# Patient Record
Sex: Male | Born: 1965 | Race: White | Hispanic: No | Marital: Married | Smoking: Never smoker
Health system: Southern US, Community
[De-identification: ages and names within clinical notes are randomized; demographics above are authoritative.]

## PROBLEM LIST (undated history)

## (undated) DIAGNOSIS — Z5111 Encounter for antineoplastic chemotherapy: Secondary | ICD-10-CM

## (undated) DIAGNOSIS — Z9889 Other specified postprocedural states: Secondary | ICD-10-CM

## (undated) DIAGNOSIS — R112 Nausea with vomiting, unspecified: Secondary | ICD-10-CM

## (undated) DIAGNOSIS — I10 Essential (primary) hypertension: Secondary | ICD-10-CM

## (undated) DIAGNOSIS — G43909 Migraine, unspecified, not intractable, without status migrainosus: Secondary | ICD-10-CM

## (undated) DIAGNOSIS — M75102 Unspecified rotator cuff tear or rupture of left shoulder, not specified as traumatic: Secondary | ICD-10-CM

## (undated) DIAGNOSIS — K219 Gastro-esophageal reflux disease without esophagitis: Secondary | ICD-10-CM

## (undated) DIAGNOSIS — J189 Pneumonia, unspecified organism: Secondary | ICD-10-CM

## (undated) DIAGNOSIS — Z923 Personal history of irradiation: Secondary | ICD-10-CM

## (undated) DIAGNOSIS — C3492 Malignant neoplasm of unspecified part of left bronchus or lung: Secondary | ICD-10-CM

## (undated) DIAGNOSIS — Z7189 Other specified counseling: Secondary | ICD-10-CM

## (undated) HISTORY — PX: TONSILLECTOMY: SUR1361

## (undated) HISTORY — DX: Malignant neoplasm of unspecified part of left bronchus or lung: C34.92

## (undated) HISTORY — PX: TOTAL KNEE ARTHROPLASTY: SHX125

## (undated) HISTORY — PX: INGUINAL HERNIA REPAIR: SUR1180

## (undated) HISTORY — DX: Personal history of irradiation: Z92.3

## (undated) HISTORY — DX: Migraine, unspecified, not intractable, without status migrainosus: G43.909

## (undated) HISTORY — DX: Essential (primary) hypertension: I10

## (undated) HISTORY — DX: Gastro-esophageal reflux disease without esophagitis: K21.9

## (undated) HISTORY — DX: Encounter for antineoplastic chemotherapy: Z51.11

## (undated) HISTORY — DX: Other specified counseling: Z71.89

---

## 1999-10-21 ENCOUNTER — Emergency Department (HOSPITAL_COMMUNITY): Admission: EM | Admit: 1999-10-21 | Discharge: 1999-10-21 | Payer: Self-pay | Admitting: Emergency Medicine

## 1999-10-21 ENCOUNTER — Encounter: Payer: Self-pay | Admitting: Emergency Medicine

## 2001-04-20 ENCOUNTER — Encounter: Admission: RE | Admit: 2001-04-20 | Discharge: 2001-04-20 | Payer: Self-pay | Admitting: *Deleted

## 2006-06-29 ENCOUNTER — Emergency Department (HOSPITAL_COMMUNITY): Admission: EM | Admit: 2006-06-29 | Discharge: 2006-06-29 | Payer: Self-pay | Admitting: Emergency Medicine

## 2008-01-04 ENCOUNTER — Inpatient Hospital Stay (HOSPITAL_COMMUNITY): Admission: RE | Admit: 2008-01-04 | Discharge: 2008-01-06 | Payer: Self-pay | Admitting: Orthopedic Surgery

## 2009-11-08 ENCOUNTER — Ambulatory Visit: Payer: Self-pay | Admitting: Internal Medicine

## 2009-11-08 DIAGNOSIS — I10 Essential (primary) hypertension: Secondary | ICD-10-CM | POA: Insufficient documentation

## 2009-11-08 DIAGNOSIS — K219 Gastro-esophageal reflux disease without esophagitis: Secondary | ICD-10-CM

## 2009-11-08 DIAGNOSIS — R232 Flushing: Secondary | ICD-10-CM | POA: Insufficient documentation

## 2009-11-08 DIAGNOSIS — B351 Tinea unguium: Secondary | ICD-10-CM | POA: Insufficient documentation

## 2009-11-08 HISTORY — DX: Essential (primary) hypertension: I10

## 2009-11-08 HISTORY — DX: Gastro-esophageal reflux disease without esophagitis: K21.9

## 2009-11-11 ENCOUNTER — Encounter: Payer: Self-pay | Admitting: Internal Medicine

## 2009-11-11 LAB — CONVERTED CEMR LAB
Metaneph Total, Ur: 415 ug/24hr (ref 182–739)
Metanephrines, Ur: 123 (ref 58–203)
Normetanephrine, 24H Ur: 292 (ref 88–649)

## 2009-11-20 ENCOUNTER — Ambulatory Visit: Payer: Self-pay | Admitting: Internal Medicine

## 2009-11-20 ENCOUNTER — Encounter: Payer: Self-pay | Admitting: Internal Medicine

## 2009-11-20 DIAGNOSIS — J209 Acute bronchitis, unspecified: Secondary | ICD-10-CM | POA: Insufficient documentation

## 2009-11-26 ENCOUNTER — Telehealth: Payer: Self-pay | Admitting: Internal Medicine

## 2009-12-13 ENCOUNTER — Telehealth: Payer: Self-pay | Admitting: Internal Medicine

## 2009-12-19 ENCOUNTER — Telehealth: Payer: Self-pay | Admitting: Internal Medicine

## 2010-01-07 ENCOUNTER — Telehealth: Payer: Self-pay | Admitting: Internal Medicine

## 2010-04-24 ENCOUNTER — Ambulatory Visit: Payer: Self-pay | Admitting: Internal Medicine

## 2010-05-01 ENCOUNTER — Ambulatory Visit: Payer: Self-pay | Admitting: Internal Medicine

## 2010-05-19 ENCOUNTER — Telehealth: Payer: Self-pay | Admitting: Internal Medicine

## 2010-05-20 ENCOUNTER — Ambulatory Visit: Payer: Self-pay | Admitting: Internal Medicine

## 2010-08-04 ENCOUNTER — Telehealth: Payer: Self-pay | Admitting: Internal Medicine

## 2010-10-14 ENCOUNTER — Ambulatory Visit
Admission: RE | Admit: 2010-10-14 | Discharge: 2010-10-14 | Payer: Self-pay | Source: Home / Self Care | Attending: Internal Medicine | Admitting: Internal Medicine

## 2010-10-20 ENCOUNTER — Telehealth: Payer: Self-pay | Admitting: Internal Medicine

## 2010-11-09 LAB — CONVERTED CEMR LAB
ALT: 34 units/L (ref 0–53)
AST: 23 units/L (ref 0–37)
Albumin: 4.3 g/dL (ref 3.5–5.2)
Alkaline Phosphatase: 67 units/L (ref 39–117)
BUN: 19 mg/dL (ref 6–23)
Basophils Absolute: 0 10*3/uL (ref 0.0–0.1)
Basophils Relative: 0 % (ref 0.0–3.0)
Bilirubin, Direct: 0.1 mg/dL (ref 0.0–0.3)
CO2: 33 meq/L — ABNORMAL HIGH (ref 19–32)
Calcium: 9.6 mg/dL (ref 8.4–10.5)
Chloride: 100 meq/L (ref 96–112)
Creatinine, Ser: 1.4 mg/dL (ref 0.4–1.5)
Eosinophils Absolute: 0.2 10*3/uL (ref 0.0–0.7)
Eosinophils Relative: 3.7 % (ref 0.0–5.0)
GFR calc non Af Amer: 58.8 mL/min (ref >60)
Glucose, Bld: 120 mg/dL — ABNORMAL HIGH (ref 70–99)
HCT: 42.2 % (ref 39.0–52.0)
Hemoglobin: 14.1 g/dL (ref 13.0–17.0)
Lymphocytes Relative: 23.5 % (ref 12.0–46.0)
Lymphs Abs: 1.2 10*3/uL (ref 0.7–4.0)
MCHC: 33.4 g/dL (ref 30.0–36.0)
MCV: 97.3 fL (ref 78.0–100.0)
Monocytes Absolute: 0.4 10*3/uL (ref 0.1–1.0)
Monocytes Relative: 8.3 % (ref 3.0–12.0)
Neutro Abs: 3.5 10*3/uL (ref 1.4–7.7)
Neutrophils Relative %: 64.5 % (ref 43.0–77.0)
Platelets: 219 10*3/uL (ref 150.0–400.0)
Potassium: 4.5 meq/L (ref 3.5–5.1)
RBC: 4.33 M/uL (ref 4.22–5.81)
RDW: 12 % (ref 11.5–14.6)
Sodium: 139 meq/L (ref 135–145)
TSH: 1.5 microintl units/mL (ref 0.35–5.50)
Total Bilirubin: 0.6 mg/dL (ref 0.3–1.2)
Total Protein: 7.2 g/dL (ref 6.0–8.3)
WBC: 5.3 10*3/uL (ref 4.5–10.5)

## 2010-11-10 ENCOUNTER — Telehealth: Payer: Self-pay | Admitting: Internal Medicine

## 2010-11-11 NOTE — Assessment & Plan Note (Signed)
Summary: jones pt/earache?/SD   Vital Signs:  Patient profile:   45 year old male Height:      72 inches Weight:      221 pounds BMI:     30.08 O2 Sat:      96 % on Room air Temp:     98.1 degrees F oral Pulse rate:   68 / minute BP sitting:   122 / 58  (left arm) Cuff size:   large  Vitals Entered By: Bill Salinas CMA (May 20, 2010 3:52 PM)  O2 Flow:  Room air CC: pt here with c/o both ears aching x 3 days/ ab   Primary Care Provider:  Etta Grandchild MD  CC:  pt here with c/o both ears aching x 3 days/ ab.  History of Present Illness: Patient presents c/o hearing change, worse in the left ear. He describes a pressure type pain. He has had no fever, no drainage from the ear, no tinnitis.  Current Medications (verified): 1)  Micardis 80 Mg Tabs (Telmisartan) .... One By Mouth Once Daily  Allergies (verified): No Known Drug Allergies  Past History:  Past Medical History: Last updated: 11/08/2009 Hypertension GERD  Past Surgical History: Last updated: 11/08/2009 Inguinal herniorrhaphy Total knee replacement PSH reviewed for relevance, FH reviewed for relevance  Review of Systems  The patient denies fever, decreased hearing, hoarseness, syncope, dyspnea on exertion, peripheral edema, and hemoptysis.    Physical Exam  General:  heavy-set white male in no distress Head:  Normocephalic and atraumatic without obvious abnormalities. No apparent alopecia or balding. Ears:  EACs clear. right TM OK, left TM without erythema, poor lankmarks, poor movement with insuffalation.   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 Hearing mildly impaired.  Plan - otc decongestant, sudafed - 30mg  three times a day, as needed.   Complete Medication List: 1)  Micardis 80 Mg Tabs (Telmisartan) .... One by mouth once daily

## 2010-11-11 NOTE — Progress Notes (Signed)
Summary: Samples  Phone Note Call from Patient Call back at [phone]   Summary of Call: Patient is requesting samples of BP med due to [facility] delay.  Initial call taken by: Lamar Sprinkles, CMA,  January 07, 2010 11:10 AM  Follow-up for Phone Call        spouse notified. Follow-up by: Lucious Groves,  January 08, 2010 8:32 AM

## 2010-11-11 NOTE — Progress Notes (Signed)
Summary: refill request  Phone Note Refill Request Message from:  Fax from Pharmacy on November 26, 2009 9:12 AM  Refills Requested: Medication #1:  Sandria Senter ER 8-10 MG/5ML LQCR 5 ml by mouth two times a day as needed for cough. Pt last given rx at 11/20/09 appt. Is this ok to refill?  [facility] [address]  Initial call taken by: Rock Nephew CMA,  November 26, 2009 9:13 AM    Prescriptions: Sandria Senter ER 8-10 MG/5ML LQCR (CHLORPHENIRAMINE-HYDROCODONE) 5 ml by mouth two times a day as needed for cough  #4 ounces x 0   Entered and Authorized by:   Etta Grandchild MD   Signed by:   Etta Grandchild MD on 11/26/2009   Method used:   Printed then faxed to ...       [id number] (ret[facility]       [address]       [place], [place]  [address]       Ph: [phone] or [phone]       Fax: [phone]   RxID:   [id number]

## 2010-11-11 NOTE — Assessment & Plan Note (Signed)
Summary: STITCH REMOVAL / MAY COME AT 4:00 /NWS   Vital Signs:  Patient profile:   45 year old male Height:      72 inches Weight:      221 pounds BMI:     30.08 O2 Sat:      97 % on Room air Temp:     98.3 degrees F oral Pulse rate:   65 / minute BP sitting:   120 / 78  (left arm) Cuff size:   large  Vitals Entered By: Bill Salinas CMA (May 01, 2010 3:36 PM)  O2 Flow:  Room air CC: suture removal/ab    Primary Care Provider:  Etta Grandchild MD  CC:  suture removal/ab .  History of Present Illness: Patient for suture removal from right thumb. He has done well with no problems: no pain, no drainage.   Current Medications (verified): 1)  Micardis 80 Mg Tabs (Telmisartan) .... One By Mouth Once Daily  Allergies (verified): No Known Drug Allergies PMH-FH-SH reviewed-no changes except otherwise noted  Review of Systems  The patient denies fever, suspicious skin lesions, abnormal bleeding, and enlarged lymph nodes.    Physical Exam  General:  Well-developed,well-nourished,in no acute distress; alert,appropriate and cooperative throughout examination Lungs:  normal respiratory effort.  normal respiratory effort.   Heart:  normal rate and regular rhythm.  normal rate and regular rhythm.   Skin:  laceration right thumb has healed well. Suture line without dehisceince. After suture removal wound looked good with good closure.    Impression & Recommendations:  Problem # 1:  LACERATION, RIGHT THUMB (ICD-883.0) Patient for suture removal. Good wound healing. CMA Bullins removed sutures without difficulty.   Complete Medication List: 1)  Micardis 80 Mg Tabs (Telmisartan) .... One by mouth once daily

## 2010-11-11 NOTE — Letter (Signed)
Summary: Results Follow-up Letter  [facility]  [address] [place], [place] [address]   Phone: [phone]  Fax: [phone]    11/11/2009  [address] [place], [place]  [address]  Dear Mr. Diantonio,   The following are the results of your recent test(s):  Test     Result     Kidney     normal Blood sugar     slightly elevated at 120 CBC       normal Liver       normal Thyroid     normal   _________________________________________________________  Please call for an appointment in 1-2 months _________________________________________________________ _________________________________________________________ _________________________________________________________  Sincerely,  Sanda Linger MD [facility]

## 2010-11-11 NOTE — Letter (Signed)
Summary: Out of Work  [facility]  [address] [place], [place] [address]   Phone: [phone]  Fax: [phone]    November 20, 2009   Employee:  FERRELL CLAIBORNE    To Whom It May Concern:   For Medical reasons, please excuse the above named employee from work for the following dates:  Start:   11/17/09  End:   11/24/09  If you need additional information, please feel free to contact our office.         Sincerely,    Etta Grandchild MD

## 2010-11-11 NOTE — Progress Notes (Signed)
  Phone Note Call from Patient Call back at [phone]   Caller: SpouseDondra Spry [phone] Summary of Call: Patient wife called satating that pt c/o Earache since coming back from recent beach trip. She would like to know what MD suggest, drops etc. Please advise Initial call taken by: Rock Nephew CMA,  May 19, 2010 2:50 PM  Follow-up for Phone Call        Spoke w/pt, scheduled for office visit for eval Follow-up by: Lamar Sprinkles, CMA,  May 19, 2010 3:13 PM

## 2010-11-11 NOTE — Assessment & Plan Note (Signed)
Summary: NEW [employer] PT--PT/GIVEN ---STC   Vital Signs:  Patient profile:   45 year old male Height:      72 inches Weight:      224 pounds BMI:     30.49 O2 Sat:      95 % on Room air Temp:     98.7 degrees F oral Pulse rate:   74 / minute Pulse rhythm:   regular Resp:     16 per minute BP sitting:   122 / 80  (left arm) Cuff size:   large  Vitals Entered By: Rock Nephew CMA (November 08, 2009 4:22 PM)  Nutrition Counseling: Patient's BMI is greater than 25 and therefore counseled on weight management options.  O2 Flow:  Room air  Primary Care Provider:  Etta Grandchild MD   History of Present Illness: New to me with complaints  1. dizziness and facial flushing for "a while" 2. all toenails on right foot have thickening, pain 3. Htn. f/up  Hypertension History:      He denies headache, chest pain, palpitations, dyspnea with exertion, orthopnea, PND, peripheral edema, and syncope.  He notes the following problems with antihypertensive medication side effects: flushing and dizzness.        Positive major cardiovascular risk factors include hypertension and family history for ischemic heart disease (females less than 7 years old & males less than 18 years old).  Negative major cardiovascular risk factors include male age less than 83 years old, no history of diabetes or hyperlipidemia, and non-tobacco-user status.        Further assessment for target organ damage reveals no history of ASHD, cardiac end-organ damage (CHF/LVH), stroke/TIA, peripheral vascular disease, renal insufficiency, or hypertensive retinopathy.     Preventive Screening-Counseling & Management  Alcohol-Tobacco     Alcohol drinks/day: 2     Alcohol type: beer     >5/day in last 3 mos: yes     Alcohol Counseling: to STOP drinking     Feels need to cut down: no     Feels annoyed by complaints: no     Feels guilty re: drinking: yes     Needs 'eye opener' in am: yes     Smoking Status:  never  Caffeine-Diet-Exercise     Does Patient Exercise: yes  Hep-HIV-STD-Contraception     Hepatitis Risk: no risk noted     HIV Risk: no risk noted     STD Risk: no risk noted     TSE monthly: yes  Safety-Violence-Falls     Seat Belt Use: yes     Helmet Use: yes     Firearms in the Home: firearms in the home     Firearm Counseling: to practice firearm safety     Smoke Detectors: yes     Violence in the Home: no risk noted     Sexual Abuse: no      Sexual History:  currently monogamous.        Drug Use:  never and no.        Blood Transfusions:  no.    Current Medications (verified): 1)  Micardis Hct 80-25 Mg Tabs (Telmisartan-Hctz) .... Take 1 Tablet By Mouth Once A Day  Allergies (verified): No Known Drug Allergies  Past History:  Past Medical History: Hypertension GERD  Past Surgical History: Inguinal herniorrhaphy Total knee replacement  Family History: Family History of CAD Male 1st degree relative <60 Family History of CAD Male 1st degree relative <50 Family History  Lung cancer  Social History: Occupation: Designer, industrial/product Never Smoked Alcohol use-yes Drug use-no Regular exercise-yes Married Smoking Status:  never Drug Use:  never, no Does Patient Exercise:  yes Hepatitis Risk:  no risk noted HIV Risk:  no risk noted STD Risk:  no risk noted Seat Belt Use:  yes Sexual History:  currently monogamous Blood Transfusions:  no  Review of Systems       The patient complains of weight gain.  The patient denies anorexia, fever, weight loss, chest pain, syncope, peripheral edema, prolonged cough, headaches, hemoptysis, abdominal pain, melena, hematochezia, severe indigestion/heartburn, hematuria, suspicious skin lesions, difficulty walking, depression, enlarged lymph nodes, and angioedema.    Physical Exam  General:  alert, well-developed, well-nourished, well-hydrated, and overweight-appearing.  face is ruddy and seems to flush more with anxiety  or attention. Head:  normocephalic, atraumatic, no abnormalities observed, and no abnormalities palpated.   Eyes:  vision grossly intact, pupils equal, pupils round, and pupils reactive to light.   Mouth:  Oral mucosa and oropharynx without lesions or exudates.  Teeth in good repair. Neck:  supple, full ROM, no masses, no thyromegaly, no thyroid nodules or tenderness, no JVD, no carotid bruits, and no cervical lymphadenopathy.   Lungs:  normal respiratory effort, no intercostal retractions, no accessory muscle use, normal breath sounds, no dullness, no fremitus, no crackles, and no wheezes.   Heart:  normal rate, regular rhythm, no murmur, no gallop, no rub, and no JVD.   Abdomen:  soft, non-tender, normal bowel sounds, no distention, no masses, no guarding, no rigidity, no hepatomegaly, and no splenomegaly.   Msk:  normal ROM, no joint tenderness, no joint swelling, no joint warmth, no redness over joints, no joint deformities, no joint instability, and no crepitation.   Pulses:  R and L carotid,radial,femoral,dorsalis pedis and posterior tibial pulses are full and equal bilaterally Extremities:  No clubbing, cyanosis, edema, or deformity noted with normal full range of motion of all joints.   Skin:  all toenails on the right side have thickening, debris, lysis, and discoloration but there is no erythema or exudate. the left side is not involved. Cervical Nodes:  no anterior cervical adenopathy and no posterior cervical adenopathy.   Axillary Nodes:  no R axillary adenopathy and no L axillary adenopathy.   Inguinal Nodes:  no R inguinal adenopathy and no L inguinal adenopathy.   Psych:  normally interactive, good eye contact, not depressed appearing, not agitated, not suicidal, and slightly anxious.   Additional Exam:  EKG shows NSR with no Q waves or ST/T wave changes.   Impression & Recommendations:  Problem # 1:  FACIAL FLUSHING (ICD-782.62) Assessment New will r/o pheo, ask him to abstain  from alchohol, stop HCTZ, and check labs Orders: Venipuncture ([address]) TLB-BMP (Basic Metabolic Panel-BMET) (80048-METABOL) TLB-CBC Platelet - w/Differential (85025-CBCD) TLB-Hepatic/Liver Function Pnl (80076-HEPATIC) TLB-TSH (Thyroid Stimulating Hormone) (84443-TSH) T-Urine 24 Hr. Metanephrines [phone])  Problem # 2:  HYPERTENSION (ICD-401.9) Assessment: Improved  The following medications were removed from the medication list:    Micardis Hct 80-25 Mg Tabs (Telmisartan-hctz) .Marland Kitchen... Take 1 tablet by mouth once a day  Orders: Venipuncture ([address]) TLB-BMP (Basic Metabolic Panel-BMET) (80048-METABOL) TLB-CBC Platelet - w/Differential (85025-CBCD) TLB-Hepatic/Liver Function Pnl (80076-HEPATIC) TLB-TSH (Thyroid Stimulating Hormone) (84443-TSH) T-Urine 24 Hr. 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 One by mouth once daily  Orders: Venipuncture ([address]) TLB-BMP (Basic Metabolic Panel-BMET) (80048-METABOL) TLB-CBC Platelet - w/Differential (85025-CBCD) TLB-Hepatic/Liver Function Pnl (80076-HEPATIC) TLB-TSH (Thyroid Stimulating Hormone) (84443-TSH) T-Urine 24 Hr. Metanephrines [phone])  Complete Medication List: 1)  Terbinafine Hcl 250 Mg Tabs (Terbinafine hcl) .... One by mouth once daily  Hypertension Assessment/Plan:      The patient's hypertensive risk group is category B: At least one risk factor (excluding diabetes) with no target organ damage.  Today's blood pressure is 122/80.  His blood pressure goal is < 140/90.  Patient Instructions: 1)  Please schedule a follow-up appointment in 1 month. 2)  It is important that you exercise regularly at least 20 minutes 5 times a week. If you develop chest pain,  have severe difficulty breathing, or feel very tired , stop exercising immediately and seek medical attention. 3)  You need to lose weight. Consider a lower calorie diet and regular exercise.  4)  Check your Blood Pressure regularly. If it is above 140/90: you should make an appointment. 5)  Stop drinking alcohol. Prescriptions: TERBINAFINE HCL 250 MG TABS (TERBINAFINE HCL) One by mouth once daily  #30 x 4   Entered and Authorized by:   Etta Grandchild MD   Signed by:   Etta Grandchild MD on 11/08/2009   Method used:   Print then Give to Patient   RxID:   [id number]   Preventive Care Screening  Last Tetanus Booster:    Date:  10/12/2008    Results:  Historical     Immunization History:  Influenza Immunization History:    Influenza:  historical (07/12/2009)

## 2010-11-11 NOTE — Progress Notes (Signed)
Summary: SAMPLES  Phone Note Call from Patient   Caller: Wife - [phone] Summary of Call: Patient is requesting samples of micardis while waiting on mail order.  Initial call taken by: Lamar Sprinkles, CMA,  August 04, 2010 11:35 AM  Follow-up for Phone Call        Samples ready, left vm on hm # Follow-up by: Lamar Sprinkles, CMA,  August 04, 2010 6:19 PM    Prescriptions: MICARDIS 80 MG TABS (TELMISARTAN) One by mouth once daily  #21 x 0   Entered by:   Lamar Sprinkles, CMA   Authorized by:   Etta Grandchild MD   Signed by:   Lamar Sprinkles, CMA on 08/04/2010   Method used:   Samples Given   RxID:   [id number]

## 2010-11-11 NOTE — Progress Notes (Signed)
Summary: Micardis samples  Phone Note Call from Patient   Caller: Spouse--Gail--[phone] Summary of Call: Spouse called requesting samples of Micardis, stating that she did not allow enough time for [facility] order and the patient is completely out of this med. [facility]  Initial call taken by: Lucious Groves,  December 19, 2009 8:34 AM  Follow-up for Phone Call        Spouse notified that prescription was sent to [facility] on 3/4 and samples placed up front for pickup. Follow-up by: Lucious Groves,  December 19, 2009 8:37 AM

## 2010-11-11 NOTE — Assessment & Plan Note (Signed)
Summary: injured thumb/jones/cd   Vital Signs:  Patient profile:   45 year old male Height:      72 inches Weight:      221 pounds BMI:     30.08 O2 Sat:      98 % on Room air Temp:     98.3 degrees F oral Pulse rate:   67 / minute BP sitting:   138 / 82  (left arm) Cuff size:   large  Vitals Entered By: Bill Salinas CMA (April 24, 2010 2:05 PM)  O2 Flow:  Room air CC: pt here for evaluation of cut (from a can) on his right thumb/ ab   Primary Care Provider:  Etta Grandchild MD  CC:  pt here for evaluation of cut (from a can) on his right thumb/ ab.  History of Present Illness: patient presents acutely for laceration of the right thumb.  Current Medications (verified): 1)  Micardis 80 Mg Tabs (Telmisartan) .... One By Mouth Once Daily  Allergies (verified): No Known Drug Allergies  Past History:  Past Medical History: Last updated: 11/08/2009 Hypertension GERD  Past Surgical History: Last updated: 11/08/2009 Inguinal herniorrhaphy Total knee replacement PSH reviewed for relevance, FH reviewed for relevance  Review of Systems  The patient denies fever, chest pain, dyspnea on exertion, abdominal pain, and abnormal bleeding.    Physical Exam  General:  Well-developed,well-nourished,in no acute distress; alert,appropriate and cooperative throughout examination Skin:  3 cm sharp edged laceration to the side of the right thumb.   Impression & Recommendations:  Problem # 1:  LACERATION, RIGHT THUMB (ICD-883.0)  clean wound less than 4 hrs old.   Repair: patient soaked thumb in betadiene and water for 10 minutes             wound was anesthetized with 2% xylocaine             re-prepped with betadiene             closed with 5 interrupted sutures using 4-0 Ethilon             bandaid applied             tetnus shot given   Gave routine wound precautions. He will return in one week for suture removal.  Orders: Lacerat Intermd STE 2.6 - 7.5 cm  (12032)  Complete Medication List: 1)  Micardis 80 Mg Tabs (Telmisartan) .... One by mouth once daily  Other Orders: Tdap => 50yrs IM (01751) Admin 1st Vaccine (02585)   Immunizations Administered:  Tetanus Vaccine:    Vaccine Type: Tdap    Site: left deltoid    Mfr: GlaxoSmithKline    Dose: 0.5 ml    Route: IM    Given by: Ami Bullins CMA    Exp. Date: 04/10/2012    Lot #: [id number]    VIS given: 08/30/07 version given April 24, 2010.

## 2010-11-11 NOTE — Assessment & Plan Note (Signed)
Summary: FLU SYMPTOMS-FEVER,CONGESTION,LB   Vital Signs:  Patient profile:   45 year old male Height:      72 inches Weight:      224 pounds O2 Sat:      97 % on Room air Temp:     98.7 degrees F oral Pulse rate:   90 / minute Pulse rhythm:   regular Resp:     16 per minute BP sitting:   124 / 76  (right arm)  O2 Flow:  Room air  Primary Care Provider:  Etta Grandchild MD  CC:  URI symptoms.  History of Present Illness:  URI Symptoms      This is a 45 year old man who presents with URI symptoms.  The symptoms began 4 days ago.  The severity is described as moderate.  The patient reports nasal congestion, sore throat, and productive cough, but denies clear nasal discharge, purulent nasal discharge, earache, and sick contacts.  Associated symptoms include fever of 100.5-103 degrees, use of an antipyretic, and response to antipyretic.  The patient denies stiff neck, dyspnea, wheezing, rash, vomiting, and diarrhea.  The patient also reports headache, muscle aches, and severe fatigue.  The patient denies the following risk factors for Strep sinusitis: unilateral facial pain, unilateral nasal discharge, double sickening, Strep exposure, tender adenopathy, and absence of cough.    Preventive Screening-Counseling & Management  Alcohol-Tobacco     Alcohol drinks/day: 2     Alcohol type: beer     >5/day in last 3 mos: yes     Alcohol Counseling: to STOP drinking     Feels need to cut down: no     Feels annoyed by complaints: no     Feels guilty re: drinking: yes     Needs 'eye opener' in am: yes     Smoking Status: never  Clinical Review Panels:  Diabetes Management   Creatinine:  1.4 (11/08/2009)   Last Flu Vaccine:  Historical (07/12/2009)  CBC   WBC:  5.3 (11/08/2009)   RBC:  4.33 (11/08/2009)   Hgb:  14.1 (11/08/2009)   Hct:  42.2 (11/08/2009)   Platelets:  219.0 (11/08/2009)   MCV  97.3 (11/08/2009)   MCHC  33.4 (11/08/2009)   RDW  12.0 (11/08/2009)   PMN:  64.5  (11/08/2009)   Lymphs:  23.5 (11/08/2009)   Monos:  8.3 (11/08/2009)   Eosinophils:  3.7 (11/08/2009)   Basophil:  0.0 (11/08/2009)  Complete Metabolic Panel   Glucose:  120 (11/08/2009)   Sodium:  139 (11/08/2009)   Potassium:  4.5 (11/08/2009)   Chloride:  100 (11/08/2009)   CO2:  33 (11/08/2009)   BUN:  19 (11/08/2009)   Creatinine:  1.4 (11/08/2009)   Albumin:  4.3 (11/08/2009)   Total Protein:  7.2 (11/08/2009)   Calcium:  9.6 (11/08/2009)   Total Bili:  0.6 (11/08/2009)   Alk Phos:  67 (11/08/2009)   SGPT (ALT):  34 (11/08/2009)   SGOT (AST):  23 (11/08/2009)   Medications Prior to Update: 1)  Terbinafine Hcl 250 Mg Tabs (Terbinafine Hcl) .... One By Mouth Once Daily  Allergies (verified): No Known Drug Allergies  Past History:  Past Medical History: Reviewed history from 11/08/2009 and no changes required. Hypertension GERD  Past Surgical History: Reviewed history from 11/08/2009 and no changes required. Inguinal herniorrhaphy Total knee replacement  Family History: Reviewed history from 11/08/2009 and no changes required. Family History of CAD Male 1st degree relative <60 Family History of CAD Male  1st degree relative <50 Family History Lung cancer  Social History: Reviewed history from 11/08/2009 and no changes required. Occupation: Designer, industrial/product Never Smoked Alcohol use-yes Drug use-no Regular exercise-yes Married  Review of Systems       The patient complains of fever.  The patient denies anorexia, chest pain, syncope, hemoptysis, abdominal pain, hematuria, muscle weakness, suspicious skin lesions, and enlarged lymph nodes.    Physical Exam  General:  uncomfortable-appearing and mild distress with np cough.  well-developed, well-nourished, well-hydrated, appropriate dress, cooperative to examination, good hygiene, uncomfortable-appearing, and mild distress.   Head:  normocephalic, atraumatic, no abnormalities observed, and no  abnormalities palpated.   Eyes:  vision grossly intact, pupils equal, pupils round, and pupils reactive to light.   Mouth:  Oral mucosa and oropharynx without lesions or exudates.  Teeth in good repair. Neck:  supple, full ROM, no masses, no thyromegaly, no thyroid nodules or tenderness, no JVD, no carotid bruits, and no cervical lymphadenopathy.   Lungs:  normal respiratory effort, no intercostal retractions, no accessory muscle use, normal breath sounds, no dullness, no fremitus, no crackles, and no wheezes.   Heart:  normal rate, regular rhythm, no murmur, no gallop, no rub, and no JVD.   Abdomen:  soft, non-tender, normal bowel sounds, no distention, no masses, no guarding, no rigidity, no hepatomegaly, and no splenomegaly.   Msk:  normal ROM, no joint tenderness, no joint swelling, no joint warmth, no redness over joints, no joint deformities, no joint instability, and no crepitation.   Pulses:  R and L carotid,radial,femoral,dorsalis pedis and posterior tibial pulses are full and equal bilaterally Extremities:  No clubbing, cyanosis, edema, or deformity noted with normal full range of motion of all joints.   Neurologic:  No cranial nerve deficits noted. Station and gait are normal. Plantar reflexes are down-going bilaterally. DTRs are symmetrical throughout. Sensory, motor and coordinative functions appear intact. Skin:  all toenails on the right side have thickening, debris, lysis, and discoloration but there is no erythema or exudate. the left side is not involved. Cervical Nodes:  no anterior cervical adenopathy and no posterior cervical adenopathy.   Axillary Nodes:  no R axillary adenopathy and no L axillary adenopathy.   Psych:  normally interactive, good eye contact, not depressed appearing, not agitated, not suicidal, and slightly anxious.     Impression & Recommendations:  Problem # 1:  BRONCHITIS-ACUTE (ICD-466.0) Assessment New  His updated medication list for this problem  includes:    Zithromax Tri-pak 500 Mg Tab (Azithromycin) .Marland Kitchen... Take one by mouth once daily for 3 days    Tussionex Pennkinetic Er 8-10 Mg/23ml Lqcr (Chlorpheniramine-hydrocodone) .Marland KitchenMarland KitchenMarland KitchenMarland Kitchen 5 ml by mouth two times a day as needed for cough  Take antibiotics and other medications as directed. Encouraged to push clear liquids, get enough rest, and take acetaminophen as needed. To be seen in 5-7 days if no improvement, sooner if worse.  Problem # 2:  COUGH (ICD-786.2) Assessment: New this sounds like flu but he has presented too late to benefit from anti-viral medication, chest xray is ordered to look for secondary pna Orders: T-2 View CXR (71020TC)  Complete Medication List: 1)  Terbinafine Hcl 250 Mg Tabs (Terbinafine hcl) .... One by mouth once daily 2)  Zithromax Tri-pak 500 Mg Tab (Azithromycin) .... Take one by mouth once daily for 3 days 3)  Tussionex Pennkinetic Er 8-10 Mg/26ml Lqcr (Chlorpheniramine-hydrocodone) .... 5 ml by mouth two times a day as needed for cough  Patient Instructions: 1)  Please schedule a follow-up appointment in 2 weeks. 2)  Take your antibiotic as prescribed until ALL of it is gone, but stop if you develop a rash or swelling and contact our office as soon as possible. 3)  Acute bronchitis symptoms for less than 10 days are not helped by antibiotics. take over the counter cough medications. call if no improvment in  5-7 days, sooner if increasing cough, fever, or new symptoms( shortness of breath, chest pain). Prescriptions: TUSSIONEX PENNKINETIC ER 8-10 MG/5ML LQCR (CHLORPHENIRAMINE-HYDROCODONE) 5 ml by mouth two times a day as needed for cough  #4 ounces x 0   Entered and Authorized by:   Etta Grandchild MD   Signed by:   Etta Grandchild MD on 11/20/2009   Method used:   Print then Give to Patient   RxID:   [id number] ZITHROMAX TRI-PAK 500 MG TAB (AZITHROMYCIN) Take one by mouth once daily for 3 days  #3 x 0   Entered and Authorized by:   Etta Grandchild  MD   Signed by:   Etta Grandchild MD on 11/20/2009   Method used:   Print then Give to Patient   RxID:   [id number]

## 2010-11-11 NOTE — Miscellaneous (Signed)
Summary: Stitches in [facility]  Stitches in [facility]   Imported By: Sherian Rein 04/28/2010 08:19:59  _____________________________________________________________________  External Attachment:    Type:   Image     Comment:   External Document

## 2010-11-11 NOTE — Progress Notes (Signed)
Summary: MICARDIS  Phone Note Call from Patient   Caller: [phone] Calvin Wagner -wife Summary of Call: Patient is requesting rx for micardis 80mg . [facility]  Initial call taken by: Lamar Sprinkles, CMA,  December 13, 2009 8:36 AM  Follow-up for Phone Call        done Follow-up by: Etta Grandchild MD,  December 13, 2009 8:40 AM    New/Updated Medications: MICARDIS 80 MG TABS (TELMISARTAN) One by mouth once daily Prescriptions: MICARDIS 80 MG TABS (TELMISARTAN) One by mouth once daily  #90 x 3   Entered and Authorized by:   Etta Grandchild MD   Signed by:   Etta Grandchild MD on 12/13/2009   Method used:   Electronically to        [facility]* (mail-order)             ,          Ph: [phone]       Fax: [phone]   RxID:   [id number] MICARDIS 80 MG TABS (TELMISARTAN) One by mouth once daily  #30 x 11   Entered and Authorized by:   Etta Grandchild MD   Signed by:   Etta Grandchild MD on 12/13/2009   Method used:   Electronically to        [id number] (retail)       [address]       [place], [place]  [address]       Ph: [phone] or [phone]       Fax: [phone]   RxID:   [id number]

## 2010-11-13 NOTE — Assessment & Plan Note (Signed)
Summary: CONGESTION /NWS   Vital Signs:  Patient profile:   45 year old male Height:      72 inches (182.88 cm) Weight:      221 pounds (100.45 kg) O2 Sat:      98 % on Room air Temp:     99.3 degrees F (37.39 degrees C) oral Pulse rate:   91 / minute BP sitting:   118 / 80  (left arm) Cuff size:   large  Vitals Entered By: Orlan Leavens RMA (October 14, 2010 1:04 PM)  O2 Flow:  Room air CC: chest&head congestion, URI symptoms Is Patient Diabetic? No Pain Assessment Patient in pain? no        Primary Care Provider:  Etta Grandchild MD  CC:  chest&head congestion and URI symptoms.  History of Present Illness:  URI Symptoms      This is a 45 year old man who presents with URI symptoms.  The symptoms began 1 week ago.  The severity is described as moderate.  progressive symptoms - started as head cold, now into chest in past 2 days.  The patient reports nasal congestion, sore throat, productive cough, earache, and sick contacts, but denies purulent nasal discharge.  Associated symptoms include low-grade fever (<100.5 degrees) and use of an antipyretic.  The patient denies stiff neck, dyspnea, wheezing, rash, vomiting, and diarrhea.  The patient also reports seasonal symptoms and severe fatigue.  The patient denies itchy throat, sneezing, headache, and muscle aches.  The patient denies the following risk factors for Strep sinusitis: tooth pain, Strep exposure, tender adenopathy, and absence of cough.    Current Medications (verified): 1)  Micardis 80 Mg Tabs (Telmisartan) .... 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    Physical Exam  General:  alert,  well-developed, well-nourished, and cooperative to examination.   constant shallow cough Eyes:  vision grossly intact; pupils equal, round and reactive to light.  conjunctiva and lids normal.    Ears:  normal pinnae bilaterally, without erythema, swelling, or tenderness to palpation. TMs hazy but without effusion, or cerumen impaction. Hearing grossly normal bilaterally  Mouth:  Oral mucosa and oropharynx without lesions or exudates.  Teeth in good repair. mild erythema, no PND Lungs:  B rhonchi - no w/c, good air mvmt bilaterally and no inc WOB at rest/conversation  Heart:  normal rate, regular rhythm, no murmur, and no rub. BLE without edema.    Impression & Recommendations:  Problem # 1:  BRONCHITIS-ACUTE (ICD-466.0)  His updated medication list for this problem includes:    Azithromycin 250 Mg Tabs (Azithromycin) .Marland Kitchen... 2 tabs by mouth today, then 1 by mouth daily starting tomorrow    Hydromet 5-1.5 Mg/67ml Syrp (Hydrocodone-homatropine) .Marland KitchenMarland KitchenMarland KitchenMarland Kitchen 5 cc by mouth every 4 hours as needed for cough, esp at bedtime  Take antibiotics and other medications as directed. Encouraged to push clear liquids, get enough rest, and take acetaminophen as needed. To be seen in 5-7 days if no improvement, sooner if worse.  Orders: Prescription Created Electronically [phone])  Complete Medication List: 1)  Micardis 80 Mg Tabs (Telmisartan) .... One by mouth once daily 2)  Azithromycin 250 Mg Tabs (Azithromycin) .... 2 tabs by mouth today,  then 1 by mouth daily starting tomorrow 3)  Hydromet 5-1.5 Mg/63ml Syrp (Hydrocodone-homatropine) .... 5 cc by mouth every 4 hours as needed for cough, esp at bedtime  Patient Instructions: 1)  it was good to see you today. 2)  Zpack and cough syrup - your prescriptions have been electronically submitted or faxed to your pharmacy. Please take as directed. Contact our office if you believe you're having problems with the medication(s).  3)  ok to continue mucinex dm as ongoing  as needed  4)  Get plenty of rest, drink lots of clear liquids, and use Tylenol or Ibuprofen for fever and comfort. Return in 7-10 days if you're not better:sooner if you're feeling worse. Prescriptions: HYDROMET 5-1.5 MG/5ML SYRP (HYDROCODONE-HOMATROPINE) 5 cc by mouth every 4 hours as needed for cough, esp at bedtime  #100cc x 0   Entered and Authorized by:   Newt Lukes MD   Signed by:   Newt Lukes MD on 10/14/2010   Method used:   Printed then faxed to ...       [id number] ([facility]       [address]       [place], [place]  [address]       Ph: [phone] or [phone]       Fax: [phone]   RxID:   [id number] AZITHROMYCIN 250 MG TABS (AZITHROMYCIN) 2 tabs by mouth today, then 1 by mouth daily starting tomorrow  #6 x 0   Entered and Authorized by:   Newt Lukes MD   Signed by:   Newt Lukes MD on 10/14/2010   Method used:   Electronically to        [id number] (retail)       [address]       [place], [place]  [address]       Ph: [phone] or [phone]       Fax: [phone]   RxID:   [id number]    Orders Added: 1)  Est. Patient Level IV [57322] 2)  Prescription Created Electronically [phone]

## 2010-11-13 NOTE — Progress Notes (Signed)
Summary: wants another antiobiotic  Phone Note Call from Patient Call back at Home Phone [phone]   Caller: Patient Summary of Call: Pt saw Dr. Felicity Coyer last week.  He's not any better and is requesting another, stronger antibiotic. [phone] is the pt's cell. Initial call taken by: Hilarie Fredrickson,  October 20, 2010 10:04 AM  Follow-up for Phone Call        left detailed vm on pt's hm # Follow-up by: Lamar Sprinkles, CMA,  October 20, 2010 12:20 PM    New/Updated Medications: AMOXICILLIN-POT CLAVULANATE 500-125 MG TABS (AMOXICILLIN-POT CLAVULANATE) One by mouth three times a day with food for 10 days Prescriptions: AMOXICILLIN-POT CLAVULANATE 500-125 MG TABS (AMOXICILLIN-POT CLAVULANATE) One by mouth three times a day with food for 10 days  #30 x 0   Entered and Authorized by:   Etta Grandchild MD   Signed by:   Etta Grandchild MD on 10/20/2010   Method used:   Electronically to        [id number] ([facility]ail[facility]       [address]       [place], [place]  [address]       Ph: [phone] or [phone]       Fax: [phone]   RxID:   [id number]

## 2010-11-19 NOTE — Progress Notes (Signed)
Summary: SAMPLES  Phone Note Call from Patient Call back at [phone]   Caller: Wife Summary of Call: Patient is requesting samples of Micardis while waiting on mail order.  Initial call taken by: Lamar Sprinkles, CMA,  November 10, 2010 11:52 AM  Follow-up for Phone Call        Samples of Micardis 80mg  placed upfront ( ZOX#[id number] A exp 07/2014) Wife notifed and will pick up.Alvy Beal Archie CMA  November 10, 2010 2:22 PM

## 2010-12-09 ENCOUNTER — Telehealth: Payer: Self-pay | Admitting: Internal Medicine

## 2010-12-18 NOTE — Progress Notes (Signed)
  Phone Note Call from Patient   Caller: Spouse Summary of Call: LMOVM requesting samples of Micardis 80. Patient notified and samples put up front.Alvy Beal Archie CMA  December 09, 2010 4:17 PM

## 2010-12-31 ENCOUNTER — Other Ambulatory Visit: Payer: Self-pay | Admitting: Internal Medicine

## 2010-12-31 MED ORDER — TELMISARTAN 80 MG PO TABS: 80.0000 mg | ORAL_TABLET | Freq: Every day | ORAL | Status: DC

## 2010-12-31 NOTE — Telephone Encounter (Signed)
Refill sent electronic to pharmacy

## 2011-02-24 NOTE — Op Note (Signed)
Calvin Wagner, Calvin Wagner                ACCOUNT NO.:  [id number]   MEDICAL RECORD NO.:  [id number]          PATIENT TYPE:  INP   LOCATION:  2550                         FACILITY:  [facility]   PHYSICIAN:  Feliberto Gottron. Turner Daniels, M.D.   DATE OF BIRTH:  [birth year]   DATE OF PROCEDURE:  01/04/2008  DATE OF DISCHARGE:                               OPERATIVE REPORT   PREOPERATIVE DIAGNOSIS:  End-stage arthritis left knee with varus  deformity about 5-10 degrees and bone-on-bone arthritic changes.   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   PROCEDURE:  Cemented left total knee arthroplasty using DePuy Sigma RP  components, 4 femur, 4 tibia, 10 mm Sigma RP spacer, 38 mm patellar  button, double batch of DePuy HV cement with 1500 mg Zinacef.   SURGEON:  Feliberto Gottron.  Turner Daniels, MD   FIRST ASSISTANT:  Skip Mayer, PA-C   ANESTHETIC:  Was general endotracheal.   ESTIMATED BLOOD LOSS:  Minimal.   FLUID REPLACEMENT:  A liter of crystalloid.   DRAINS PLACED:  Foley catheter.   URINE OUTPUT:  Three hundred mL and two medium Hemovacs.   TOURNIQUET TIME:  Was 1 hour and 10 minutes.   INDICATIONS FOR PROCEDURE:  A 45 year old man with end-stage arthritis  of the left knee, bone-on-bone arthritic changes by x-ray with a varus  deformity that has been gradually increasing over time and he now has  erosive changes of the medial femoral condyle into the medial tibial  plateau.  He desires elective left total knee arthroplasty to decrease  pain and increase function.  Risks and benefits of surgery discussed,  questions answered.   DESCRIPTION OF PROCEDURE:  The patient was identified by armband and  underwent left femoral nerve block anesthesia in the block area at [facility] main hospital.  He was then taken to operating room 4 where the  appropriate anesthetic monitors were attached and general endotracheal  anesthesia  induced with the patient in supine position.  Tourniquet was  applied high to the left thigh, lateral post and foot positioner applied  to the table and the left lower extremity prepped and draped in the  usual sterile fashion from the ankle to the tourniquet.  A time-out was  performed.  The patient did receive 2 grams of Ancef preoperatively.  The limb was wrapped with an Esmarch bandage.  The tourniquet inflated  to 350 mmHg.  I began the procedure by making an anterior midline  incision 15 cm in length starting a handbreadth above the patella, going  over the patella 1 cm medial to and 2 cm distal to the tibial tubercle.  Small bleeders in the skin and subcutaneous tissue were identified and  cauterized.  Transverse retinaculum was incised over the patella and  reflected medially allowing for a medial parapatellar arthrotomy.  The  patella was everted.  The prepatellar fat pad resected.  Superficial  medial collateral ligament was elevated from anterior to posterior off  the proximal aspect of the tibia leaving it intact distally allowing  [place]  to hyperflex the knee and externally rotate the knee.  Using osteotomes  large medial osteophytes were removed as well as notch osteophytes.  The  cruciate ligaments were resected.  An osteotome was also used to remove  the tibial spines.  A posteromedial Z retractor was then placed, a  McCullough retractor through the notch and a lateral Homan retractor.  The proximal tibia was then entered with the DePuy step drill followed  by the intramedullary rod to the full depth.  A 0 degree posterior slope  cutting guide was then pinned into place allowing resection of about 7  mm of bone medially, almost a centimeter of bone laterally because of  the varus deformity.  Satisfied with the proximal tibial cut which was  performed with an oscillating saw protecting the posterior structures  with the retractors we then entered the distal femur 2 mm anterior  to  the PCL origin with a step drill, followed by the intramedullary rod and  a 5 degree left distal femoral cutting guide set at 11 mm along the  epicondylar axis.  The distal femoral cut was accomplished without  difficulty.  We sized for a 4 left femoral component using the posterior  sizing guide and pinned it in 3 degrees of external rotation.  The #4  chamfer cutting guide was then hammered into place.  The anterior and  posterior chamfer cuts accomplished without difficulty followed by the  Sigma RP box cutting guide and the box cut.  The patella itself was  measured at 26 mm.  The cutting guide set at 16 and the posterior 10 mm  of the patella resected and sized for a 38 button and drilled.  We then  checked our extension gap for the 10 mm sizing spacer and found it to be  excellent as was the flexion gap.   The knee was once again hyperflexed.  We sized the proximal tibia to a  #4 plate which was pinned into place followed by the smokestack and the  smokestack conical reamer.  The Delta fin keel punch was then applied  with a bullet tip and a #4 left trial femoral component hammered into  place followed by a #4 lug drill.  A 10 mm Sigma RP trial spacer was  then inserted, the knee brought to full extension and flexed to 135  degrees.  Patellar button tracking was noted to be excellent with the  trial button.  At this point the trial components were removed.  All  bony surfaces were water picked clean, dried with suction and sponges  and a double batch of DePuy HV cement was then mixed with 1500 mg of  Zinacef and applied to all bony and metallic mating surfaces except for  the posterior condyles of the femur itself.  In order we hammered into  place a #4 tibial baseplate and removed excess cement, a #4 left femoral  component and removed excess cement and a 38 mm patellar button was  squeezed into place and excess cement removed.  All the cement cured.  The wound was irrigated  out with normal saline solution pulse lavage one  more time and medium Hemovac drains were placed deep in the wound.  We  then closed the knee in layers with #1 running Vicryl suture in the  arthrotomy, 0 and 2-0 undyed Vicryl suture in the subcutaneous tissue  and staples in the skin.  A dressing of Xeroform, 4x4 dressing sponges,  Webril and  Ace wrap applied.  The tourniquet was let down.  The patient  awakened and taken to the recovery room without difficulty.      Feliberto Gottron. Turner Daniels, M.D.  Electronically Signed     FJR/MEDQ  D:  01/04/2008  T:  01/04/2008  Job:  [id number]

## 2011-02-27 NOTE — Discharge Summary (Signed)
Calvin Wagner                ACCOUNT NO.:  [id number]   MEDICAL RECORD NO.:  [id number]          PATIENT TYPE:  INP   LOCATION:  5040                         FACILITY:  [facility]   PHYSICIAN:  Calvin Wagner, M.D.   DATE OF BIRTH:  08/26/1966   DATE OF ADMISSION:  01/04/2008  DATE OF DISCHARGE:  01/06/2008                               DISCHARGE SUMMARY   FINAL DIAGNOSIS:  End-stage degenerative joint disease of the left knee.   PROCEDURE WHILE IN HOSPITAL:  Left total knee arthroplasty.   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  Now, he has bone-on-  bone arthritic change of the x-ray with varus deformity that has been  gradually increasing over time.  He has had erosive changes in the  medial femoral condyle and medial tibial condyle, and __________  left  total knee arthroplasty to decrease pain and increase function.  Risk,  benefits of the surgery were discussed and he wishes to proceed, as his  pain meds are no longer controlling his pain and he has failed  conservative management.   ALLERGIES:  No known drug allergies.   MEDICATIONS:  Micardis and Vicodin.   PAST SURGICAL HISTORY:  Childhood diseases, history of hypertension, and  DJD.   SURGICAL HISTORY:  Left knee scope, hernia, no difficulty with GUT  except for postoperative nausea.   SOCIAL HISTORY:  No tobacco.  Positive ethanol occasionally.  No IV drug  abuse.  He is married.  He works for [employer].   FAMILY HISTORY:  Father is alive at age 63.  Mother died at age 79 with  history of MI and CAD.   REVIEW OF SYSTEMS:  The patient denies any glasses or dentures.  No  shortness of breath.  No chest pain.  No recent illness.   PHYSICAL EXAMINATION:  VITAL SIGNS:  Temperature 98.7, pulse 93, blood  pressure 126/71.  He is a 6 feet 1 inch, 215 pound male.  HEENT:  Head is normocephalic and atraumatic. Ears, TMs are  clear.  Eyes, pupils are equal, reactive, and responsive to light and  accomodation. Nose is patent.  Throat benign.  NECK:  Supple.  CHEST:  Clear to auscultation and percussion.  HEART:  Regular rate and rhythm.  ABDOMEN:  Soft, nontender.  EXTREMITIES:  Left knee range of motion shows decreased flexion and  extension with a 10-degree of varus deformity, positive crepitus  throughout.  SKIN:  Shows well-healed scars from previous arthroscopy.   LABORATORY DATA:  Preoperative labs including CBC, CMET, chest x-ray,  EKG, PT, and PTT were all within normal limits with exception of an EKG,  which showed signs of arrhythmia that was unchanged from previous exam.   HOSPITAL COURSE:  On the day of admission, the patient underwent a left  total knee arthroplasty using DePuy single RP components, #4 femur, #4  tibia, 10-mm single RP spacer, 38-mm patellar button, double batch of  DePuy HV cement with 1500 mg of Zinacef.  Foley catheter was placed  as  well as medium Hemovac double-arm placed into the wound.  The patient  was placed on a PCA Dilaudid pain pump postoperatively for pain control.  He was placed on postoperative Coumadin prophylaxis per  pharmacy  protocol with bridging Lovenox until he became therapeutic.  He was  begun with physical therapy also in the PACU using a CPM.  Postop day  #1, the patient was awake and alert with moderate pain.  He was taking  p.o.'s well.  Vital signs were stable.  Dressing was dry.  Drain output  700 mL, discontinued without difficulty.  Hemoglobin 11.6, WBC of 9.6,  PT 13.9.  He is otherwise neurovascularly intact, using CPM without  difficulty.  He is otherwise stable and continued with his physical  therapy.  Postoperative day #2, patient without complaint of nausea or  vomiting.  He was voiding without difficulties.  His PT was progressing  well.  He was afebrile.  Vital signs were stable, alert and oriented x3.  Hemoglobin 10.1, WBC 6.3, and  INR 1.3.  Dressing was dry.  Wound was  benign.  Calf was soft and nontender.  He was discharged home when his  PT goals were met for safe ambulation and  independent transfer.  Dressing changes prior to his discharge and then daily or as needed.  He  may shower on postoperative day #5 seated.  He will return to clinic in  1 week's time with Calvin Wagner, sooner should he have any increase in  temperature of 101, any increase in drainage or pain not well controlled  by oral pain meds.  He will have home health PT, home health RN, and CPM  as per [facility].   DISCHARGE MEDICATIONS:  Resume Micardis 80 mg q.a.m. as well as adding  Coumadin, Robaxin, and Percocet per prescription.   DIET:  Regular.   ACTIVITY:  Weightbearing as tolerated with total knee precautions and  walker.   FINAL DIAGNOSES:  End-stage degenerative joint disease, left knee.  Proceed with a hospital left total knee arthroplasty.      Calvin Wagner.      Calvin Wagner, M.D.  Electronically Signed    JBR/MEDQ  D:  02/01/2008  T:  02/02/2008  Job:  [id number]

## 2011-06-01 ENCOUNTER — Other Ambulatory Visit: Payer: Self-pay | Admitting: Internal Medicine

## 2011-06-05 ENCOUNTER — Telehealth: Payer: Self-pay

## 2011-06-05 NOTE — Telephone Encounter (Signed)
Pt wife called [facility] requesting samples of BD med. Patient took last pill today and mail order has yet to arrive. Patient wife advised to pick up

## 2011-06-08 ENCOUNTER — Other Ambulatory Visit: Payer: Self-pay | Admitting: *Deleted

## 2011-06-08 MED ORDER — TELMISARTAN 80 MG PO TABS: 80.0000 mg | ORAL_TABLET | Freq: Every day | ORAL | Status: DC

## 2011-07-06 LAB — CBC
HCT: 28.9 — ABNORMAL LOW
HCT: 33.2 — ABNORMAL LOW
HCT: 45.4
Hemoglobin: 10.1 — ABNORMAL LOW
Hemoglobin: 11.6 — ABNORMAL LOW
Hemoglobin: 15.4
MCHC: 33.9
MCHC: 34.9
MCHC: 34.9
MCV: 93.9
MCV: 94.6
MCV: 94.7
Platelets: 214
Platelets: 260
Platelets: 298
RBC: 3.08 — ABNORMAL LOW
RBC: 3.51 — ABNORMAL LOW
RBC: 4.79
RDW: 12.4
RDW: 12.6
RDW: 12.6
WBC: 6.2
WBC: 6.3
WBC: 9.2

## 2011-07-06 LAB — COMPREHENSIVE METABOLIC PANEL
ALT: 39
AST: 25
Albumin: 4.4
Alkaline Phosphatase: 74
BUN: 13
CO2: 30
Calcium: 10.4
Chloride: 100
Creatinine, Ser: 1.15
GFR calc Af Amer: >60
GFR calc non Af Amer: >60
Glucose, Bld: 91
Potassium: 4.1
Sodium: 140
Total Bilirubin: 0.8
Total Protein: 7.8

## 2011-07-06 LAB — BASIC METABOLIC PANEL
BUN: 9
CO2: 29
Calcium: 8.9
Chloride: 94 — ABNORMAL LOW
Creatinine, Ser: 1.15
GFR calc Af Amer: >60
GFR calc non Af Amer: >60
Glucose, Bld: 149 — ABNORMAL HIGH
Potassium: 3.6
Sodium: 131 — ABNORMAL LOW

## 2011-07-06 LAB — URINALYSIS, ROUTINE W REFLEX MICROSCOPIC
Bilirubin Urine: NEGATIVE
Glucose, UA: NEGATIVE
Hgb urine dipstick: NEGATIVE
Ketones, ur: NEGATIVE
Nitrite: NEGATIVE
Protein, ur: NEGATIVE
Specific Gravity, Urine: 1.014
Urobilinogen, UA: 0.2
pH: 7.5

## 2011-07-06 LAB — DIFFERENTIAL
Basophils Absolute: 0
Basophils Relative: 1
Eosinophils Absolute: 0.2
Eosinophils Relative: 4
Lymphocytes Relative: 27
Lymphs Abs: 1.7
Monocytes Absolute: 0.5
Monocytes Relative: 8
Neutro Abs: 3.8
Neutrophils Relative %: 61

## 2011-07-06 LAB — TYPE AND SCREEN
ABO/RH(D): AB POS
Antibody Screen: NEGATIVE

## 2011-07-06 LAB — PROTIME-INR
INR: 0.9
INR: 1.1
INR: 1.3
Prothrombin Time: 12.7
Prothrombin Time: 13.9
Prothrombin Time: 16.1 — ABNORMAL HIGH

## 2011-07-06 LAB — ABO/RH: ABO/RH(D): AB POS

## 2011-07-06 LAB — APTT: aPTT: 25

## 2011-09-10 ENCOUNTER — Telehealth: Payer: Self-pay | Admitting: *Deleted

## 2011-09-10 NOTE — Telephone Encounter (Signed)
Pt req samples of Micardis 80 mg.

## 2011-09-14 NOTE — Telephone Encounter (Signed)
Spouse advised, 1 week sample upfront for pick up

## 2011-09-25 ENCOUNTER — Telehealth: Payer: Self-pay | Admitting: *Deleted

## 2011-09-25 MED ORDER — TELMISARTAN 80 MG PO TABS: 80.0000 mg | ORAL_TABLET | Freq: Every day | ORAL | Status: DC

## 2011-09-25 NOTE — Telephone Encounter (Signed)
Caller states they are waiting for Rx from [facility] for pt's Micardis--request short supply to [facility] [facility].

## 2011-09-28 ENCOUNTER — Telehealth: Payer: Self-pay

## 2011-09-28 DIAGNOSIS — I10 Essential (primary) hypertension: Secondary | ICD-10-CM

## 2011-09-28 MED ORDER — IRBESARTAN 300 MG PO TABS: 300.0000 mg | ORAL_TABLET | Freq: Every day | ORAL | Status: DC

## 2011-09-28 NOTE — Telephone Encounter (Signed)
done

## 2011-09-28 NOTE — Telephone Encounter (Signed)
Pt's spouse informed of Rx/pharmacy.

## 2011-09-28 NOTE — Telephone Encounter (Signed)
Pt's spouse called requesting generic alternative to Micadis ($120 for 3 mths) to [facility], (Avapro or Cozaar is only $20 for 3 mths)

## 2011-10-01 ENCOUNTER — Other Ambulatory Visit: Payer: Self-pay | Admitting: *Deleted

## 2011-10-01 DIAGNOSIS — I10 Essential (primary) hypertension: Secondary | ICD-10-CM

## 2011-10-01 MED ORDER — IRBESARTAN 300 MG PO TABS: 300.0000 mg | ORAL_TABLET | Freq: Every day | ORAL | Status: DC

## 2011-10-01 NOTE — Telephone Encounter (Signed)
Pt's wife called regarding Avapro refill. Pt states that [facility] states that they will not be able to process pt's refill for about 9 days, pt has been taking samples of Micardis but switched to Avapro because they cannot afford Micardis-pt is asking for samples of Micardis until pt receives rx from [facility] to last over holidays but we are out of samples. Left message for pt's wife to callback office.

## 2011-10-02 ENCOUNTER — Other Ambulatory Visit: Payer: Self-pay | Admitting: *Deleted

## 2011-10-02 DIAGNOSIS — I10 Essential (primary) hypertension: Secondary | ICD-10-CM

## 2011-10-02 MED ORDER — IRBESARTAN 300 MG PO TABS: 300.0000 mg | ORAL_TABLET | Freq: Every day | ORAL | Status: DC

## 2011-10-02 NOTE — Telephone Encounter (Signed)
Rx for Temporary supply of [facility]ap[facility] until pt can get refill from mail order company. Pt's spouse informed.

## 2011-10-02 NOTE — Telephone Encounter (Signed)
Rx for temporary supply of Avapro sent to [facility] until pt can get order from mail order pharmacy.

## 2012-02-25 ENCOUNTER — Ambulatory Visit (INDEPENDENT_AMBULATORY_CARE_PROVIDER_SITE_OTHER): Payer: 59 | Admitting: Internal Medicine

## 2012-02-25 ENCOUNTER — Encounter: Payer: Self-pay | Admitting: Internal Medicine

## 2012-02-25 VITALS — BP 114/78 | HR 103 | Temp 98.4°F | Ht 73.0 in | Wt 230.2 lb

## 2012-02-25 DIAGNOSIS — R519 Headache, unspecified: Secondary | ICD-10-CM | POA: Insufficient documentation

## 2012-02-25 DIAGNOSIS — R51 Headache: Secondary | ICD-10-CM

## 2012-02-25 DIAGNOSIS — J019 Acute sinusitis, unspecified: Secondary | ICD-10-CM

## 2012-02-25 DIAGNOSIS — G43909 Migraine, unspecified, not intractable, without status migrainosus: Secondary | ICD-10-CM

## 2012-02-25 DIAGNOSIS — I10 Essential (primary) hypertension: Secondary | ICD-10-CM

## 2012-02-25 HISTORY — DX: Migraine, unspecified, not intractable, without status migrainosus: G43.909

## 2012-02-25 MED ORDER — TRAMADOL HCL 50 MG PO TABS: 50.0000 mg | ORAL_TABLET | Freq: Four times a day (QID) | ORAL | Status: AC | PRN

## 2012-02-25 MED ORDER — AZITHROMYCIN 250 MG PO TABS: ORAL_TABLET | ORAL | Status: AC

## 2012-02-25 NOTE — Assessment & Plan Note (Signed)
likely related to acute illness,  Not typical for migraine, for pain control,  to f/u any worsening symptoms or concerns

## 2012-02-25 NOTE — Progress Notes (Signed)
  Subjective:    Patient ID: Calvin Wagner, male    DOB: 07/29/1966, 46 y.o.   MRN: [id number]  HPI   Here with 3 days acute onset fever, facial pain, pressure, general weakness and malaise, and greenish d/c, with slight ST, but also increasing nonprod cough and Pt denies chest pain, increased sob or doe, wheezing, orthopnea, PND, increased LE swelling, palpitations, dizziness or syncope.   Pt denies polydipsia, polyuria.  Denies worsening depressive symptoms, suicidal ideation, or panic, though has ongoing anxiety, not increased recently.  Pt denies new neurological symptoms such as  facial or extremity weakness or numbness, but has marked HA assoc with symptoms, requesting med, different from typical migraine for him, as there is no nausea or photophobia.  Denies worsening depressive symptoms, suicidal ideation, or panic Past Medical History  Diagnosis Date  . Migraine 02/25/2012  . GERD 11/08/2009    Qualifier: Diagnosis of  By: Yetta Barre MD, Bernadene Bell.   . HYPERTENSION 11/08/2009    Qualifier: Diagnosis of  By: Yetta Barre MD, Bernadene Bell.    No past surgical history on file.  does not have a smoking history on file. He does not have any smokeless tobacco history on file. His alcohol and drug histories not on file. family history is not on file. No Known Allergies Current Outpatient Prescriptions on File Prior to Visit  Medication Sig Dispense Refill  . irbesartan (AVAPRO) 300 MG tablet Take 1 tablet (300 mg total) by mouth daily.  10 tablet  0   Review of Systems Review of Systems  Constitutional: Negative for diaphoresis and unexpected weight change.  HENT: Negative for drooling and tinnitus.   Eyes: Negative for photophobia and visual disturbance.  Respiratory: Negative for choking and stridor.   Gastrointestinal: Negative for vomiting and blood in stool.  Genitourinary: Negative for hematuria and decreased urine volume.  Musculoskeletal: Negative for gait problem.  Skin: Negative for color change  and wound.  Neurological: Negative for tremors and numbness.  Psychiatric/Behavioral: Negative for decreased concentration. The patient is not hyperactive.       Objective:   Physical Exam BP 114/78  Pulse 103  Temp(Src) 98.4 F (36.9 C) (Oral)  Ht 6\' 1"  (1.854 m)  Wt 230 lb 4 oz (104.441 kg)  BMI 30.38 kg/m2  SpO2 97% Physical Exam  VS noted, mild ill Constitutional: Pt appears well-developed and well-nourished.  HENT: Head: Normocephalic.  Right Ear: External ear normal.  Left Ear: External ear normal.  Bilat tm's mild erythema.  Sinus tender.  Pharynx mild erythema Eyes: Conjunctivae and EOM are normal. Pupils are equal, round, and reactive to light.  Neck: Normal range of motion. Neck supple.  Cardiovascular: Normal rate and regular rhythm.   Pulmonary/Chest: Effort normal and breath sounds normal.  Neurological: Pt is alert. Not confused Skin: Skin is warm. No erythema.  Psychiatric: Pt behavior is normal. Thought content normal. 1+ nervous, no depressed affect    Assessment & Plan:

## 2012-02-25 NOTE — Patient Instructions (Signed)
Take all new medications as prescribed Continue all other medications as before You can also take Delsym OTC for cough, and/or Mucinex (or it's generic off brand) for congestion  

## 2012-02-25 NOTE — Assessment & Plan Note (Signed)
Mild to mod, for antibx course,  to f/u any worsening symptoms or concerns 

## 2012-02-25 NOTE — Assessment & Plan Note (Signed)
stable overall by hx and exam, most recent data reviewed with pt, and pt to continue medical treatment as before BP Readings from Last 3 Encounters:  02/25/12 114/78  10/14/10 118/80  05/20/10 122/58

## 2012-10-19 ENCOUNTER — Other Ambulatory Visit: Payer: Self-pay | Admitting: Internal Medicine

## 2012-10-19 DIAGNOSIS — I10 Essential (primary) hypertension: Secondary | ICD-10-CM

## 2012-10-19 NOTE — Telephone Encounter (Signed)
Patient needs a refill on Irbesartin sent in to [facility]

## 2012-10-21 ENCOUNTER — Other Ambulatory Visit: Payer: Self-pay

## 2012-10-21 MED ORDER — IRBESARTAN 300 MG PO TABS: 300.0000 mg | ORAL_TABLET | Freq: Every day | ORAL | Status: DC

## 2012-10-21 NOTE — Telephone Encounter (Signed)
Pt scheduled appt for next Wednesday, sent # 10 of avapro to local pharmacy

## 2012-10-26 ENCOUNTER — Other Ambulatory Visit (INDEPENDENT_AMBULATORY_CARE_PROVIDER_SITE_OTHER): Payer: 59

## 2012-10-26 ENCOUNTER — Ambulatory Visit (INDEPENDENT_AMBULATORY_CARE_PROVIDER_SITE_OTHER): Payer: 59 | Admitting: Internal Medicine

## 2012-10-26 ENCOUNTER — Encounter: Payer: Self-pay | Admitting: Internal Medicine

## 2012-10-26 VITALS — BP 118/82 | HR 79 | Temp 98.7°F | Resp 16 | Wt 235.0 lb

## 2012-10-26 DIAGNOSIS — R7309 Other abnormal glucose: Secondary | ICD-10-CM

## 2012-10-26 DIAGNOSIS — I10 Essential (primary) hypertension: Secondary | ICD-10-CM

## 2012-10-26 LAB — BASIC METABOLIC PANEL
BUN: 19 mg/dL (ref 6–23)
CO2: 29 mEq/L (ref 19–32)
Calcium: 9.7 mg/dL (ref 8.4–10.5)
Chloride: 102 mEq/L (ref 96–112)
Creatinine, Ser: 1.2 mg/dL (ref 0.4–1.5)
GFR: 69.98 mL/min (ref >60.00)
Glucose, Bld: 99 mg/dL (ref 70–99)
Potassium: 3.6 mEq/L (ref 3.5–5.1)
Sodium: 138 mEq/L (ref 135–145)

## 2012-10-26 LAB — HEMOGLOBIN A1C: Hgb A1c MFr Bld: 5.8 % (ref 4.6–6.5)

## 2012-10-26 NOTE — Progress Notes (Signed)
  Subjective:    Patient ID: Calvin Wagner, male    DOB: 1966-05-14, 47 y.o.   MRN: [id number]  Hypertension This is a chronic problem. The current episode started more than 1 year ago. The problem has been gradually improving since onset. The problem is controlled. Pertinent negatives include no anxiety, blurred vision, chest pain, headaches, malaise/fatigue, neck pain, orthopnea, palpitations, peripheral edema, PND, shortness of breath or sweats. Past treatments include angiotensin blockers. Compliance problems include exercise and diet.       Review of Systems  Constitutional: Positive for unexpected weight change (weight gain). Negative for fever, chills, malaise/fatigue, diaphoresis, activity change, appetite change and fatigue.  HENT: Negative.  Negative for neck pain.   Eyes: Negative.  Negative for blurred vision.  Respiratory: Negative for apnea, cough, choking, chest tightness, shortness of breath, wheezing and stridor.   Cardiovascular: Negative for chest pain, palpitations, orthopnea, leg swelling and PND.  Gastrointestinal: Negative for nausea, vomiting, abdominal pain, diarrhea, constipation and blood in stool.  Genitourinary: Negative.   Musculoskeletal: Negative.   Skin: Negative for color change, pallor, rash and wound.  Neurological: Positive for dizziness. Negative for tremors, seizures, syncope, speech difficulty, weakness, light-headedness, numbness and headaches.  Hematological: Negative for adenopathy. Does not bruise/bleed easily.  Psychiatric/Behavioral: Negative.        Objective:   Physical Exam  Vitals reviewed. Constitutional: He is oriented to person, place, and time. He appears well-developed and well-nourished. No distress.  HENT:  Head: Normocephalic and atraumatic.  Mouth/Throat: Oropharynx is clear and moist. No oropharyngeal exudate.  Eyes: Conjunctivae normal are normal. Right eye exhibits no discharge. Left eye exhibits no discharge. No scleral  icterus.  Neck: Normal range of motion. Neck supple. No JVD present. No tracheal deviation present. No thyromegaly present.  Cardiovascular: Normal rate, regular rhythm, normal heart sounds and intact distal pulses.  Exam reveals no gallop and no friction rub.   No murmur heard. Pulmonary/Chest: Effort normal and breath sounds normal. No stridor. No respiratory distress. He has no wheezes. He has no rales. He exhibits no tenderness.  Abdominal: Soft. Bowel sounds are normal. He exhibits no distension. There is no tenderness. There is no rebound and no guarding.  Musculoskeletal: Normal range of motion. He exhibits no edema and no tenderness.  Lymphadenopathy:    He has no cervical adenopathy.  Neurological: He is alert and oriented to person, place, and time. He has normal reflexes. He displays normal reflexes. No cranial nerve deficit. He exhibits normal muscle tone. Coordination normal.  Skin: Skin is warm and dry. No rash noted. He is not diaphoretic. No erythema. No pallor.  Psychiatric: He has a normal mood and affect. His behavior is normal. Judgment and thought content normal.     Lab Results  Component Value Date   WBC 5.3 11/08/2009   HGB 14.1 11/08/2009   HCT 42.2 11/08/2009   PLT 219.0 11/08/2009   GLUCOSE 120* 11/08/2009   ALT 34 11/08/2009   AST 23 11/08/2009   NA 139 11/08/2009   K 4.5 11/08/2009   CL 100 11/08/2009   CREATININE 1.4 11/08/2009   BUN 19 11/08/2009   CO2 33* 11/08/2009   TSH 1.50 11/08/2009   INR 1.3 01/06/2008       Assessment & Plan:

## 2012-10-26 NOTE — Patient Instructions (Signed)

## 2012-10-27 ENCOUNTER — Encounter: Payer: Self-pay | Admitting: Internal Medicine

## 2012-10-28 ENCOUNTER — Other Ambulatory Visit: Payer: Self-pay | Admitting: Internal Medicine

## 2012-10-28 DIAGNOSIS — I10 Essential (primary) hypertension: Secondary | ICD-10-CM

## 2012-10-28 NOTE — Telephone Encounter (Signed)
The patient called hoping to get a refill for Irbesartan for 90 day supply.  The patient states he only received 10 pills.

## 2012-10-30 ENCOUNTER — Encounter: Payer: Self-pay | Admitting: Internal Medicine

## 2012-10-30 NOTE — Assessment & Plan Note (Signed)
His BP is well controlled I will check his lytes and renal function 

## 2012-10-30 NOTE — Assessment & Plan Note (Signed)
I will recheck his blood sugar and will check his a1c to see if he has developed DM II

## 2012-10-31 MED ORDER — IRBESARTAN 300 MG PO TABS: 300.0000 mg | ORAL_TABLET | Freq: Every day | ORAL | Status: DC

## 2012-10-31 NOTE — Telephone Encounter (Signed)
Pt given #10 on 10/21/12 prior to him coming in for an overdue appt. Pt seen 10/26/12 and now able to refill meds

## 2012-11-04 ENCOUNTER — Other Ambulatory Visit: Payer: Self-pay | Admitting: Internal Medicine

## 2012-11-04 DIAGNOSIS — I10 Essential (primary) hypertension: Secondary | ICD-10-CM

## 2012-11-04 MED ORDER — IRBESARTAN 300 MG PO TABS: 300.0000 mg | ORAL_TABLET | Freq: Every day | ORAL | Status: DC

## 2012-11-04 NOTE — Telephone Encounter (Signed)
Calvin Wagner came in for an appt on Oct 26 2012.  His wife called requesting a 90 day refill on Irbesartan 300 mg with refills.  The last refill was for 10 pills and cost $20.00.  They want it called in to the mail order.

## 2012-11-04 NOTE — Telephone Encounter (Signed)
Pt requesting a 90 day refill on Irbesartan 300 mg with refills. Please advise.

## 2012-11-04 NOTE — Telephone Encounter (Signed)
Wife called again.  She has received [facility] from [facility] of Irbesartan quantity of 10.  Each time they were billed $20.00.  They [facility]  They had wanted the 90 day supply to be sent to [facility].  If that is called in again they will be billed another $20.00.   Could they get samples?

## 2012-11-10 ENCOUNTER — Telehealth: Payer: Self-pay

## 2012-11-10 NOTE — Telephone Encounter (Signed)
Patient wife called [facility] stating that she called on Friday and has not heard anything back. She states that they have received three [facility] refill for # 10 tabs only (20$ each). She would like a call back and samples to offset cost.

## 2012-11-14 ENCOUNTER — Telehealth: Payer: Self-pay | Admitting: Internal Medicine

## 2012-11-14 NOTE — Telephone Encounter (Signed)
Patient's wife Dondra Spry would like for [facility] 10-26-2012 visit be changed to a routine physical so that insurance will pay 100% instead of applying to deductible.  I advised her that this might not be possible if the documentation supports a problem focused visit.

## 2012-11-14 NOTE — Telephone Encounter (Signed)
No, he was seen for medical issues so this will not be changed to a physical.

## 2012-11-28 ENCOUNTER — Other Ambulatory Visit: Payer: Self-pay

## 2012-11-28 ENCOUNTER — Telehealth: Payer: Self-pay

## 2012-11-28 ENCOUNTER — Encounter: Payer: Self-pay | Admitting: Internal Medicine

## 2012-11-28 DIAGNOSIS — I10 Essential (primary) hypertension: Secondary | ICD-10-CM

## 2012-11-28 MED ORDER — IRBESARTAN 300 MG PO TABS: 300.0000 mg | ORAL_TABLET | Freq: Every day | ORAL | Status: DC

## 2012-11-28 NOTE — Telephone Encounter (Signed)
Error

## 2012-11-28 NOTE — Telephone Encounter (Signed)
Patient wife requested  [facility]

## 2012-11-28 NOTE — Telephone Encounter (Signed)
Wife request 90 day mail order for Avapro, no samples available

## 2012-11-29 ENCOUNTER — Other Ambulatory Visit: Payer: Self-pay | Admitting: *Deleted

## 2012-11-29 ENCOUNTER — Telehealth: Payer: Self-pay | Admitting: Internal Medicine

## 2012-11-29 DIAGNOSIS — I10 Essential (primary) hypertension: Secondary | ICD-10-CM

## 2012-11-29 MED ORDER — IRBESARTAN 300 MG PO TABS: 300.0000 mg | ORAL_TABLET | Freq: Every day | ORAL | Status: DC

## 2012-11-29 NOTE — Telephone Encounter (Signed)
Patients wife has contacted [facility] and they still have not received the 90 day supply of irbesartan, he is out of the medication again so in addition to sending in the 90 day supply to [facility] she needs a 30 day supply [facility] [address] to get him thru until [facility] receives the refill and can send it out.   Wants this done TODAY because the patient is out of medication

## 2012-11-29 NOTE — Telephone Encounter (Signed)
Calvin Wagner spoke with Jacki Cones at [facility], they are processing the 90 day supply and it should be mailed out 11/30/12, invoice number [id number], also the 30 day supply was sent [facility] [address]  Patients wife notified via voicemail on her cell phone, told to call back with any questions

## 2013-01-28 ENCOUNTER — Other Ambulatory Visit: Payer: Self-pay | Admitting: Internal Medicine

## 2014-07-19 ENCOUNTER — Other Ambulatory Visit: Payer: Self-pay | Admitting: *Deleted

## 2014-07-19 DIAGNOSIS — R002 Palpitations: Secondary | ICD-10-CM

## 2014-07-21 ENCOUNTER — Emergency Department (HOSPITAL_COMMUNITY): Payer: 59

## 2014-07-21 ENCOUNTER — Encounter (HOSPITAL_COMMUNITY): Payer: Self-pay | Admitting: Emergency Medicine

## 2014-07-21 ENCOUNTER — Observation Stay (HOSPITAL_COMMUNITY)
Admission: EM | Admit: 2014-07-21 | Discharge: 2014-07-22 | Disposition: A | Discharge status: Home / Self Care | Payer: 59 | Attending: Internal Medicine | Admitting: Internal Medicine

## 2014-07-21 DIAGNOSIS — Z23 Encounter for immunization: Secondary | ICD-10-CM | POA: Diagnosis not present

## 2014-07-21 DIAGNOSIS — R110 Nausea: Secondary | ICD-10-CM | POA: Diagnosis not present

## 2014-07-21 DIAGNOSIS — R079 Chest pain, unspecified: Secondary | ICD-10-CM | POA: Diagnosis present

## 2014-07-21 DIAGNOSIS — R42 Dizziness and giddiness: Secondary | ICD-10-CM | POA: Diagnosis not present

## 2014-07-21 DIAGNOSIS — R1013 Epigastric pain: Secondary | ICD-10-CM

## 2014-07-21 DIAGNOSIS — R0602 Shortness of breath: Secondary | ICD-10-CM | POA: Diagnosis not present

## 2014-07-21 DIAGNOSIS — R200 Anesthesia of skin: Secondary | ICD-10-CM | POA: Insufficient documentation

## 2014-07-21 DIAGNOSIS — R61 Generalized hyperhidrosis: Secondary | ICD-10-CM | POA: Diagnosis not present

## 2014-07-21 DIAGNOSIS — I10 Essential (primary) hypertension: Secondary | ICD-10-CM | POA: Insufficient documentation

## 2014-07-21 DIAGNOSIS — F1020 Alcohol dependence, uncomplicated: Secondary | ICD-10-CM

## 2014-07-21 DIAGNOSIS — R0789 Other chest pain: Secondary | ICD-10-CM | POA: Diagnosis present

## 2014-07-21 DIAGNOSIS — R109 Unspecified abdominal pain: Secondary | ICD-10-CM | POA: Diagnosis present

## 2014-07-21 DIAGNOSIS — K219 Gastro-esophageal reflux disease without esophagitis: Secondary | ICD-10-CM | POA: Diagnosis not present

## 2014-07-21 DIAGNOSIS — G43909 Migraine, unspecified, not intractable, without status migrainosus: Secondary | ICD-10-CM | POA: Diagnosis not present

## 2014-07-21 LAB — RAPID URINE DRUG SCREEN, HOSP PERFORMED
Amphetamines: NOT DETECTED
Barbiturates: NOT DETECTED
Benzodiazepines: NOT DETECTED
Cocaine: NOT DETECTED
Opiates: NOT DETECTED
Tetrahydrocannabinol: NOT DETECTED

## 2014-07-21 LAB — HEPATIC FUNCTION PANEL
ALT: 29 U/L (ref 0–53)
AST: 22 U/L (ref 0–37)
Albumin: 3.7 g/dL (ref 3.5–5.2)
Alkaline Phosphatase: 55 U/L (ref 39–117)
Bilirubin, Direct: <0.2 mg/dL (ref 0.0–0.3)
Total Bilirubin: 0.4 mg/dL (ref 0.3–1.2)
Total Protein: 6.8 g/dL (ref 6.0–8.3)

## 2014-07-21 LAB — CBC
HCT: 39.5 % (ref 39.0–52.0)
Hemoglobin: 14.5 g/dL (ref 13.0–17.0)
MCH: 33.8 pg (ref 26.0–34.0)
MCHC: 36.7 g/dL — ABNORMAL HIGH (ref 30.0–36.0)
MCV: 92.1 fL (ref 78.0–100.0)
Platelets: 232 10*3/uL (ref 150–400)
RBC: 4.29 MIL/uL (ref 4.22–5.81)
RDW: 12.4 % (ref 11.5–15.5)
WBC: 7.2 10*3/uL (ref 4.0–10.5)

## 2014-07-21 LAB — BASIC METABOLIC PANEL
Anion gap: 17 — ABNORMAL HIGH (ref 5–15)
BUN: 16 mg/dL (ref 6–23)
CO2: 22 mEq/L (ref 19–32)
Calcium: 10.4 mg/dL (ref 8.4–10.5)
Chloride: 96 mEq/L (ref 96–112)
Creatinine, Ser: 1.09 mg/dL (ref 0.50–1.35)
GFR calc Af Amer: >90 mL/min (ref >90)
GFR calc non Af Amer: 79 mL/min — ABNORMAL LOW (ref >90)
Glucose, Bld: 102 mg/dL — ABNORMAL HIGH (ref 70–99)
Potassium: 4 mEq/L (ref 3.7–5.3)
Sodium: 135 mEq/L — ABNORMAL LOW (ref 137–147)

## 2014-07-21 LAB — LIPASE, BLOOD: Lipase: 28 U/L (ref 11–59)

## 2014-07-21 LAB — I-STAT TROPONIN, ED: Troponin i, poc: 0 ng/mL (ref 0.00–0.08)

## 2014-07-21 LAB — D-DIMER, QUANTITATIVE: D-Dimer, Quant: <0.27 ug/mL-FEU (ref 0.00–0.48)

## 2014-07-21 LAB — ETHANOL: Alcohol, Ethyl (B): <11 mg/dL (ref 0–11)

## 2014-07-21 LAB — TROPONIN I: Troponin I: <0.3 ng/mL (ref <0.30)

## 2014-07-21 MED ORDER — ADULT MULTIVITAMIN W/MINERALS CH
1.0000 | ORAL_TABLET | Freq: Every day | ORAL | Status: DC
  Administered 2014-07-21 – 2014-07-22 (×2): 1 via ORAL
  Filled 2014-07-21 (×2): qty 1

## 2014-07-21 MED ORDER — HYDROCHLOROTHIAZIDE 12.5 MG PO CAPS
12.5000 mg | ORAL_CAPSULE | Freq: Every day | ORAL | Status: DC
  Administered 2014-07-22: 12.5 mg via ORAL
  Filled 2014-07-21 (×2): qty 1

## 2014-07-21 MED ORDER — LORAZEPAM 1 MG PO TABS: 1.0000 mg | ORAL_TABLET | Freq: Four times a day (QID) | ORAL | Status: DC | PRN

## 2014-07-21 MED ORDER — MORPHINE SULFATE 2 MG/ML IJ SOLN: 2.0000 mg | INTRAMUSCULAR | Status: DC | PRN

## 2014-07-21 MED ORDER — NITROGLYCERIN 2 % TD OINT
0.5000 [in_us] | TOPICAL_OINTMENT | Freq: Four times a day (QID) | TRANSDERMAL | Status: DC
  Administered 2014-07-21 – 2014-07-22 (×3): 0.5 [in_us] via TOPICAL
  Filled 2014-07-21: qty 30

## 2014-07-21 MED ORDER — ASPIRIN EC 325 MG PO TBEC
325.0000 mg | DELAYED_RELEASE_TABLET | Freq: Every day | ORAL | Status: DC
  Administered 2014-07-22: 325 mg via ORAL
  Filled 2014-07-21: qty 1

## 2014-07-21 MED ORDER — ONDANSETRON HCL 4 MG PO TABS: 4.0000 mg | ORAL_TABLET | Freq: Four times a day (QID) | ORAL | Status: DC | PRN

## 2014-07-21 MED ORDER — SODIUM CHLORIDE 0.9 % IV BOLUS (SEPSIS)
1000.0000 mL | Freq: Once | INTRAVENOUS | Status: AC
  Administered 2014-07-21: 1000 mL via INTRAVENOUS

## 2014-07-21 MED ORDER — ALUM & MAG HYDROXIDE-SIMETH 200-200-20 MG/5ML PO SUSP: 30.0000 mL | Freq: Four times a day (QID) | ORAL | Status: DC | PRN

## 2014-07-21 MED ORDER — LORAZEPAM 2 MG/ML IJ SOLN: 1.0000 mg | Freq: Four times a day (QID) | INTRAMUSCULAR | Status: DC | PRN

## 2014-07-21 MED ORDER — LISINOPRIL-HYDROCHLOROTHIAZIDE 10-12.5 MG PO TABS: 1.0000 | ORAL_TABLET | Freq: Every day | ORAL | Status: DC

## 2014-07-21 MED ORDER — ACETAMINOPHEN 650 MG RE SUPP: 650.0000 mg | Freq: Four times a day (QID) | RECTAL | Status: DC | PRN

## 2014-07-21 MED ORDER — THIAMINE HCL 100 MG/ML IJ SOLN
100.0000 mg | Freq: Every day | INTRAMUSCULAR | Status: DC
  Filled 2014-07-21 (×2): qty 1

## 2014-07-21 MED ORDER — MORPHINE SULFATE 4 MG/ML IJ SOLN
4.0000 mg | Freq: Once | INTRAMUSCULAR | Status: AC
  Administered 2014-07-21: 4 mg via INTRAVENOUS
  Filled 2014-07-21: qty 1

## 2014-07-21 MED ORDER — LORAZEPAM 2 MG/ML IJ SOLN
1.0000 mg | Freq: Once | INTRAMUSCULAR | Status: AC
  Administered 2014-07-21: 1 mg via INTRAVENOUS
  Filled 2014-07-21: qty 1

## 2014-07-21 MED ORDER — INFLUENZA VAC SPLIT QUAD 0.5 ML IM SUSY
0.5000 mL | PREFILLED_SYRINGE | INTRAMUSCULAR | Status: AC
  Administered 2014-07-22: 0.5 mL via INTRAMUSCULAR
  Filled 2014-07-21: qty 0.5

## 2014-07-21 MED ORDER — PROMETHAZINE HCL 25 MG/ML IJ SOLN
25.0000 mg | Freq: Once | INTRAMUSCULAR | Status: AC
  Administered 2014-07-21: 25 mg via INTRAVENOUS
  Filled 2014-07-21: qty 1

## 2014-07-21 MED ORDER — ENOXAPARIN SODIUM 40 MG/0.4ML ~~LOC~~ SOLN
40.0000 mg | SUBCUTANEOUS | Status: DC
  Administered 2014-07-21: 40 mg via SUBCUTANEOUS
  Filled 2014-07-21 (×2): qty 0.4

## 2014-07-21 MED ORDER — SODIUM CHLORIDE 0.9 % IJ SOLN
3.0000 mL | Freq: Two times a day (BID) | INTRAMUSCULAR | Status: DC
  Administered 2014-07-21 – 2014-07-22 (×2): 3 mL via INTRAVENOUS

## 2014-07-21 MED ORDER — ALBUTEROL SULFATE (2.5 MG/3ML) 0.083% IN NEBU: 2.5000 mg | INHALATION_SOLUTION | RESPIRATORY_TRACT | Status: DC | PRN

## 2014-07-21 MED ORDER — FOLIC ACID 1 MG PO TABS
1.0000 mg | ORAL_TABLET | Freq: Every day | ORAL | Status: DC
  Administered 2014-07-21 – 2014-07-22 (×2): 1 mg via ORAL
  Filled 2014-07-21 (×2): qty 1

## 2014-07-21 MED ORDER — ACETAMINOPHEN 325 MG PO TABS: 650.0000 mg | ORAL_TABLET | Freq: Four times a day (QID) | ORAL | Status: DC | PRN

## 2014-07-21 MED ORDER — VITAMIN B-1 100 MG PO TABS
100.0000 mg | ORAL_TABLET | Freq: Every day | ORAL | Status: DC
  Administered 2014-07-21 – 2014-07-22 (×2): 100 mg via ORAL
  Filled 2014-07-21 (×2): qty 1

## 2014-07-21 MED ORDER — ONDANSETRON HCL 4 MG/2ML IJ SOLN: 4.0000 mg | Freq: Four times a day (QID) | INTRAMUSCULAR | Status: DC | PRN

## 2014-07-21 MED ORDER — PANTOPRAZOLE SODIUM 40 MG IV SOLR
40.0000 mg | INTRAVENOUS | Status: DC
  Administered 2014-07-21: 40 mg via INTRAVENOUS
  Filled 2014-07-21 (×2): qty 40

## 2014-07-21 MED ORDER — ONDANSETRON HCL 4 MG/2ML IJ SOLN
4.0000 mg | Freq: Once | INTRAMUSCULAR | Status: AC
  Administered 2014-07-21: 4 mg via INTRAVENOUS
  Filled 2014-07-21: qty 2

## 2014-07-21 MED ORDER — SODIUM CHLORIDE 0.9 % IV SOLN
INTRAVENOUS | Status: AC
  Administered 2014-07-21: 21:00:00 via INTRAVENOUS

## 2014-07-21 MED ORDER — LISINOPRIL 10 MG PO TABS
10.0000 mg | ORAL_TABLET | Freq: Every day | ORAL | Status: DC
  Administered 2014-07-22: 10 mg via ORAL
  Filled 2014-07-21 (×2): qty 1

## 2014-07-21 MED ORDER — HYDROCODONE-ACETAMINOPHEN 5-325 MG PO TABS: 1.0000 | ORAL_TABLET | ORAL | Status: DC | PRN

## 2014-07-21 NOTE — ED Provider Notes (Signed)
CSN: [id number]     Arrival date & time 07/21/14  1315 History   First MD Initiated Contact with Patient 07/21/14 1334     Chief Complaint  Patient presents with  . Chest Pain     (Consider location/radiation/quality/duration/timing/severity/associated sxs/prior Treatment) HPI Comments: Patient presents today with a chief complaint of chest pain, lightheadedness, and SOB.  He reports onset of symptoms just prior to arrival while driving his car.  He reports that he has been having similar symptoms intermittently over the past couple of weeks.  Symptoms typically last an hour and then resolve without intervention.  He reports that the symptoms are typically brought on by exercise.  He reports that symptoms were associated with numbness of both arms, diaphoresis, and nausea today.  He reports that he felt like he was going to pass out.  He denies syncope, vomiting, LE edema, or abdominal pain.  He was given ASA and NG x 1 by EMS en route, but did not feel that it helped.  He denies prior cardiac history.  He does have a history of HTN and borderline hyperlipidemia.  No history of DM.  He does not smoke.  He does report that his mother had a MI at age 47 and his brother had a MI at the age of 14.  Patient denies prolonged travel or surgeries in the past 4 weeks.  Denies history of PE or DVT.  Denies LE edema.    Patient is a 48 y.o. male presenting with chest pain. The history is provided by the patient.  Chest Pain   Past Medical History  Diagnosis Date  . Migraine 02/25/2012  . GERD 11/08/2009    Qualifier: Diagnosis of  By: Ronnald Ramp MD, Arvid Right.   . HYPERTENSION 11/08/2009    Qualifier: Diagnosis of  By: Ronnald Ramp MD, Arvid Right.    History reviewed. No pertinent past surgical history. Family History  Problem Relation Age of Onset  . Cancer Neg Hx   . Alcohol abuse Neg Hx   . Diabetes Neg Hx   . Early death Neg Hx   . Heart disease Neg Hx   . Hyperlipidemia Neg Hx   . Hypertension Neg Hx   .  Kidney disease Neg Hx   . Stroke Neg Hx    History  Substance Use Topics  . Smoking status: Never Smoker   . Smokeless tobacco: Never Used  . Alcohol Use: 3.0 oz/week    5 Glasses of wine per week    Review of Systems  Cardiovascular: Positive for chest pain.  All other systems reviewed and are negative.     Allergies  Review of patient's allergies indicates no known allergies.  Home Medications   Prior to Admission medications   Medication Sig Start Date End Date Taking? Authorizing Provider  irbesartan (AVAPRO) 300 MG tablet TAKE 1 TABLET (300 MG TOTAL) BY MOUTH AT BEDTIME. 01/28/13   Janith Lima, MD   BP 132/69  Pulse 91  Temp(Src) 98.6 F (37 C) (Oral)  Resp 18  Ht 6' (1.829 m)  Wt 215 lb (97.523 kg)  BMI 29.15 kg/m2  SpO2 100% Physical Exam  Nursing note and vitals reviewed. Constitutional: He appears well-developed and well-nourished.  HENT:  Head: Normocephalic and atraumatic.  Mouth/Throat: Oropharynx is clear and moist.  Eyes: EOM are normal. Pupils are equal, round, and reactive to light.  Neck: Normal range of motion. Neck supple.  Cardiovascular: Normal rate, regular rhythm and normal heart sounds.  Pulmonary/Chest: Effort normal and breath sounds normal. No respiratory distress. He has no wheezes. He has no rales. He exhibits no tenderness.  Abdominal: Soft. Bowel sounds are normal. He exhibits no distension and no mass. There is no tenderness. There is no rebound and no guarding.  Musculoskeletal:  No LE edema  Neurological: He is alert. He has normal strength. No cranial nerve deficit or sensory deficit. Coordination and gait normal.  Skin: Skin is warm and dry. He is not diaphoretic.  Psychiatric: He has a normal mood and affect.    ED Course  Procedures (including critical care time) Labs Review Labs Reviewed  [place], ED    Imaging Review Dg Chest Port 1 View  07/21/2014   CLINICAL DATA:   Central chest pain, shortness of breath, dizziness and nausea.  EXAM: PORTABLE CHEST - 1 VIEW  COMPARISON:  11/20/2009  FINDINGS: The heart size and mediastinal contours are within normal limits. There is no evidence of pulmonary edema, consolidation, pneumothorax, nodule or pleural fluid. The visualized skeletal structures are unremarkable.  IMPRESSION: No active disease.   Electronically Signed   By: Aletta Edouard M.D.   On: 07/21/2014 14:18     EKG Interpretation   Date/Time:  Saturday July 21 2014 13:27:51 EDT Ventricular Rate:  84 PR Interval:  185 QRS Duration: 82 QT Interval:  363 QTC Calculation: 429 R Axis:   77 Text Interpretation:  Age not entered, assumed to be  48 years old for  purpose of ECG interpretation Sinus arrhythmia Consider left atrial  enlargement Baseline wander in lead(s) V3 No significant change since last  tracing Reconfirmed by ZACKOWSKI  MD, [place] [phone]) on 07/21/2014 1:34:50  PM      MDM   Final diagnoses:  None   Patient presenting today with a chief complaint of chest pain, near syncope, and SOB.  He reports onset of symptoms just prior to arrival.  He has been having similar symptoms intermittently over the past 2 weeks usually associated with exercise.  He denies prior cardiac history.  However, he does have a strong family history and also a history of HTN.  No ischemic changes on EKG.  Initial Troponin negative.  Labs unremarkable.  CXR negative.  Patient is not PERC negative due to tachycardia.  Therefore, D-dimer ordered.  Patient admitted to [facility] for further work up and [facility].    Hyman Bible, PA-C 07/21/14 [phone]

## 2014-07-21 NOTE — H&P (Signed)
History and Physical  Calvin Wagner LMB:[id number] DOB: [birth year] DOA: 07/21/2014  Referring physician: Hyman Bible, ED PA-C. PCP: [name], MD  Outpatient Specialists:  1. None  Chief Complaint: Chest and abdominal pain.  HPI: Calvin Wagner is a 48 y.o. male with history of hypertension, hyperlipidemia, GERD, alcohol dependence, strong family history of CAD, presented to the ED with above complaints. Patient was at a party last night and drank 6-8 beers. He was in his usual state of health until 10 AM today when he started experiencing mid sternal sharp chest pain associated with nausea, dry heaving, left arm pain/tingling and tingling of both hands without dyspnea. She rated the chest pain as 4-6/10 but has subsided after medications in the ED to intermittent 3-4/10. He did have diaphoresis. He states that he has had intermittent chest pains for the last 2-3 weeks both at rest and with physical activity with no specific aggravating or relieving factors. He works lifting heavy objects. No history of long distance travel. He also gives history of epigastric pain rated as 7/10 in severity, associated with dry heaves but no vomiting, 3-4 normal BMs today. He denies sickly contacts. He gives one year history of intermittent similar abdominal pains which are aggravated and relieved by food, relieved by Gas-X, no weight loss, intermittent nausea without vomiting, no melena or blood in stools. In the ED, chest x-ray without acute findings, POC troponin x1 negative and EKG without acute findings. Hospitalist admission requested.   Review of Systems: All systems reviewed and apart from history of presenting illness, are negative.  Past Medical History  Diagnosis Date  . Migraine 02/25/2012  . GERD 11/08/2009    Qualifier: Diagnosis of  By: Ronnald Ramp MD, Arvid Right.   . HYPERTENSION 11/08/2009    Qualifier: Diagnosis of  By: Ronnald Ramp MD, Arvid Right.    History reviewed. No pertinent past surgical  history. Social History:  reports that he has never smoked. He has never used smokeless tobacco. He reports that he drinks about 3 ounces of alcohol per week. He reports that he does not use illicit drugs. Married. Independent of activities of daily living. States that he drinks 4 small cans of beers daily but may drink more on weekends up to 8 cans per day on weekends. Denies hard liquor or drug abuse.  No Known Allergies  Family History  Problem Relation Age of Onset  . Cancer Neg Hx   . Alcohol abuse Neg Hx   . Diabetes Neg Hx   . Early death Neg Hx   . Heart disease Neg Hx   . Hyperlipidemia Neg Hx   . Hypertension Neg Hx   . Kidney disease Neg Hx   . Stroke Neg Hx    patient's brother apparently had MI at age 25 and his mother died at age 5 from heart disease.  Prior to Admission medications   Medication Sig Start Date End Date Taking? Authorizing Provider  aspirin EC 81 MG tablet Take 81 mg by mouth daily.   Yes Historical Provider, MD  lisinopril-hydrochlorothiazide (PRINZIDE,ZESTORETIC) 10-12.5 MG per tablet Take 1 tablet by mouth daily.   Yes Historical Provider, MD  Multiple Vitamin (MULTIVITAMIN WITH MINERALS) TABS tablet Take 1 tablet by mouth daily.   Yes Historical Provider, MD   Physical Exam: Filed Vitals:   07/21/14 1319 07/21/14 1324 07/21/14 1505 07/21/14 1627  BP: 132/69  134/79 142/85  Pulse: 91     Temp: 98.6 F (37 C)  TempSrc: Oral     Resp: 18  16 20   Height:  6' (1.829 m)    Weight:  97.523 kg (215 lb)    SpO2: 100%  100% 99%     General exam: Moderately built and nourished pleasant young male patient, lying comfortably supine on the gurney in no obvious distress.  Head, eyes and ENT: Nontraumatic and normocephalic. Pupils equally reacting to light and accommodation. Oral mucosa slightly dry.  Neck: Supple. No JVD, carotid bruit or thyromegaly.  Lymphatics: No lymphadenopathy.  Respiratory system: Clear to auscultation. No increased work  of breathing. No reproducible chest wall tenderness  Cardiovascular system: S1 and S2 heard, RRR. No JVD, murmurs, gallops, clicks or pedal edema.  Gastrointestinal system: Abdomen is nondistended and soft. Mild epigastric tenderness without rigidity, guarding or rebound. Normal bowel sounds heard. No organomegaly or masses appreciated.  Central nervous system: Alert and oriented. No focal neurological deficits.  Extremities: Symmetric 5 x 5 power. Peripheral pulses symmetrically felt. No tremors.  Skin: No rashes or acute findings. Face appears slightly flushed.  Musculoskeletal system: Negative exam.  Psychiatry: Pleasant and cooperative.   Labs on Admission:  Basic Metabolic Panel:  Recent Labs Lab 07/21/14 1358  NA 135*  K 4.0  CL 96  CO2 22  GLUCOSE 102*  BUN 16  CREATININE 1.09  CALCIUM 10.4   Liver Function Tests: No results found for this basename: AST, ALT, ALKPHOS, BILITOT, PROT, ALBUMIN,  in the last 168 hours No results found for this basename: LIPASE, AMYLASE,  in the last 168 hours No results found for this basename: AMMONIA,  in the last 168 hours CBC:  Recent Labs Lab 07/21/14 1358  WBC 7.2  HGB 14.5  HCT 39.5  MCV 92.1  PLT 232   Cardiac Enzymes: No results found for this basename: CKTOTAL, CKMB, CKMBINDEX, TROPONINI,  in the last 168 hours  BNP (last 3 results) No results found for this basename: PROBNP,  in the last 8760 hours CBG: No results found for this basename: GLUCAP,  in the last 168 hours  Radiological Exams on Admission: Dg Chest Port 1 View  07/21/2014   CLINICAL DATA:  Central chest pain, shortness of breath, dizziness and nausea.  EXAM: PORTABLE CHEST - 1 VIEW  COMPARISON:  11/20/2009  FINDINGS: The heart size and mediastinal contours are within normal limits. There is no evidence of pulmonary edema, consolidation, pneumothorax, nodule or pleural fluid. The visualized skeletal structures are unremarkable.  IMPRESSION: No active  disease.   Electronically Signed   By: Aletta Edouard M.D.   On: 07/21/2014 14:18    EKG: Independently reviewed. Sinus rhythm without acute changes.  Assessment/Plan Principal Problem:   Chest pain Active Problems:   Essential hypertension   GERD   Alcohol dependence   Abdominal pain   Chest pain, atypical   1. Atypical Chest pain: DD- R/o ACS. Could possibly be GI in origin. Admit to step down unit. Cycle troponins. Obtain 2-D echo. Continue aspirin 325 mg daily. NTP. Cardiology consulted. Will make patient n.p.o. after midnight for possible stress test in a.m. 2. Abdominal pain: DD-gastritis/esophagitis/PUD/pancreatitis. Alcohol abstinence counseled. Check LFTs, lipase and blood alcohol level. Place on clear liquids, IV fluids, IV PPI, pain management and monitor closely. 3. Alcohol dependence: At risk for withdrawal. Place on Ativan protocol. Counseled regarding moderation/abstinence. He verbalized understanding. 4. Hypertension: Controlled. Continue home ACE inhibitor-HCTZ. 5. GERD: PPI 6. History of hyperlipidemia: Not on medications at home.  Code Status: Full  Family Communication: Discussed with spouse at bedside.  Disposition Plan: Home when medically stable.   Time spent: 28 minutes  Azzam Mehra, MD, FACP, FHM. [facility] Pager [phone]  If 7PM-7AM, please contact night-coverage [email or web address] Password [email or web address] 07/21/2014, 4:45 PM

## 2014-07-21 NOTE — ED Provider Notes (Signed)
Medical screening examination/treatment/procedure(s) were conducted as a shared visit with non-physician practitioner(s) and myself.  I personally evaluated the patient during the encounter.   EKG Interpretation   Date/Time:  Saturday July 21 2014 13:27:51 EDT Ventricular Rate:  84 PR Interval:  185 QRS Duration: 82 QT Interval:  363 QTC Calculation: 429 R Axis:   77 Text Interpretation:  Age not entered, assumed to be  48 years old for  purpose of ECG interpretation Sinus arrhythmia Consider left atrial  enlargement Baseline wander in lead(s) V3 No significant change since last  tracing Reconfirmed by Sheray Grist  MD, Gualberto Wahlen [phone]) on 07/21/2014 1:34:50  PM       Results for orders placed during the hospital encounter of 07/21/14  CBC      Result Value Ref Range   WBC 7.2  4.0 - 10.5 K/uL   RBC 4.29  4.22 - 5.81 MIL/uL   Hemoglobin 14.5  13.0 - 17.0 g/dL   HCT 39.5  39.0 - 52.0 %   MCV 92.1  78.0 - 100.0 fL   MCH 33.8  26.0 - 34.0 pg   MCHC 36.7 (*) 30.0 - 36.0 g/dL   RDW 12.4  11.5 - 15.5 %   Platelets 232  150 - 400 K/uL  BASIC METABOLIC PANEL      Result Value Ref Range   Sodium 135 (*) 137 - 147 mEq/L   Potassium 4.0  3.7 - 5.3 mEq/L   Chloride 96  96 - 112 mEq/L   CO2 22  19 - 32 mEq/L   Glucose, Bld 102 (*) 70 - 99 mg/dL   BUN 16  6 - 23 mg/dL   Creatinine, Ser 1.09  0.50 - 1.35 mg/dL   Calcium 10.4  8.4 - 10.5 mg/dL   GFR calc non Af Amer 79 (*) >90 mL/min   GFR calc Af Amer >90  >90 mL/min   Anion gap 17 (*) 5 - 15  I-STAT TROPOININ, ED      Result Value Ref Range   Troponin i, poc 0.00  0.00 - 0.08 ng/mL   Comment 3            Dg Chest Port 1 View  07/21/2014   CLINICAL DATA:  Central chest pain, shortness of breath, dizziness and nausea.  EXAM: PORTABLE CHEST - 1 VIEW  COMPARISON:  11/20/2009  FINDINGS: The heart size and mediastinal contours are within normal limits. There is no evidence of pulmonary edema, consolidation, pneumothorax, nodule or  pleural fluid. The visualized skeletal structures are unremarkable.  IMPRESSION: No active disease.   Electronically Signed   By: Aletta Edouard M.D.   On: 07/21/2014 14:18     Patient with a strong family history of coronary disease. Patient with one-week history of chest pain substernal left scribed his pressure usually last for an hour usually exertional. Today it occurred at rest while driving patient felt like he was not pass out lightheaded dizzy no vertigo point of nausea. With each of these episodes he's had a feeling of air hunger. Patient's chest x-ray is negative patient's troponin is negative. Patient still not completely chest pain-free. Patient has had nitroglycerin aspirin and morphine. We'll give more morphine and some Ativan. We'll check a d-dimer. Patient has perked positive due to heart rate 108 on the monitor. Patient is not hypoxic. Unlikely that it would be PG but it is interesting he said all this air hunger with each of these episodes. Would not want to miss  pulmonary embolism so will evaluate with d-dimer if positive will need CT angios if negative can be admitted for chest pain. Discussed with the admitting team hospitalist already. We'll work on getting him pain-free.  Fredia Sorrow, MD 07/21/14 [phone]

## 2014-07-21 NOTE — ED Notes (Signed)
Pt comes via [facility] for CP, pt was driving and pulled over reporting his vision greying out and dizziness, c/o nausea and pain in left ear and chest pressure. Hx of heart fluttering in the past. PTA pt took six ASA one nitro from EMS and zofran.

## 2014-07-21 NOTE — Consult Note (Signed)
Referring Physician: Dr. Vernell Leep.  Calvin Wagner is an 48 y.o. male.                       Chief Complaint: Chest pain  HPI: 48 y.o. male with history of hypertension, hyperlipidemia, GERD, alcohol dependence, strong family history of CAD, presented to the ED with sharp, mid-sternal chest pain with radiation to both arms and associated shortness of breath and diaphoresis.   Past Medical History  Diagnosis Date  . Migraine 02/25/2012  . GERD 11/08/2009    Qualifier: Diagnosis of  By: Ronnald Ramp MD, Arvid Right.   . HYPERTENSION 11/08/2009    Qualifier: Diagnosis of  By: Ronnald Ramp MD, Arvid Right.       History reviewed. No pertinent past surgical history.  Family History  Problem Relation Age of Onset  . Cancer Neg Hx   . Alcohol abuse Neg Hx   . Diabetes Neg Hx   . Early death Neg Hx   . Heart disease Neg Hx   . Hyperlipidemia Neg Hx   . Hypertension Neg Hx   . Kidney disease Neg Hx   . Stroke Neg Hx    Social History:  reports that he has never smoked. He has never used smokeless tobacco. He reports that he drinks about 3 ounces of alcohol per week. He reports that he does not use illicit drugs.  Allergies: No Known Allergies   (Not in a hospital admission)  Results for orders placed during the hospital encounter of 07/21/14 (from the past 48 hour(s))  CBC     Status: Abnormal   Collection Time    07/21/14  1:58 PM      Result Value Ref Range   WBC 7.2  4.0 - 10.5 K/uL   RBC 4.29  4.22 - 5.81 MIL/uL   Hemoglobin 14.5  13.0 - 17.0 g/dL   HCT 39.5  39.0 - 52.0 %   MCV 92.1  78.0 - 100.0 fL   MCH 33.8  26.0 - 34.0 pg   MCHC 36.7 (*) 30.0 - 36.0 g/dL   RDW 12.4  11.5 - 15.5 %   Platelets 232  150 - 400 K/uL  BASIC METABOLIC PANEL     Status: Abnormal   Collection Time    07/21/14  1:58 PM      Result Value Ref Range   Sodium 135 (*) 137 - 147 mEq/L   Potassium 4.0  3.7 - 5.3 mEq/L   Chloride 96  96 - 112 mEq/L   CO2 22  19 - 32 mEq/L   Glucose, Bld 102 (*) 70 - 99  mg/dL   BUN 16  6 - 23 mg/dL   Creatinine, Ser 1.09  0.50 - 1.35 mg/dL   Calcium 10.4  8.4 - 10.5 mg/dL   GFR calc non Af Amer 79 (*) >90 mL/min   GFR calc Af Amer >90  >90 mL/min   Comment: (NOTE)     The eGFR has been calculated using the CKD EPI equation.     This calculation has not been validated in all clinical situations.     eGFR's persistently <90 mL/min signify possible Chronic Kidney     Disease.   Anion gap 17 (*) 5 - 15  I-STAT TROPOININ, ED     Status: None   Collection Time    07/21/14  2:05 PM      Result Value Ref Range   Troponin i, poc 0.00  0.00 - 0.08 ng/mL   Comment 3            Comment: Due to the release kinetics of cTnI,     a negative result within the first hours     of the onset of symptoms does not rule out     myocardial infarction with certainty.     If myocardial infarction is still suspected,     repeat the test at appropriate intervals.  D-DIMER, QUANTITATIVE     Status: None   Collection Time    07/21/14  3:37 PM      Result Value Ref Range   D-Dimer, Quant <0.27  0.00 - 0.48 ug/mL-FEU   Comment:            AT THE INHOUSE ESTABLISHED CUTOFF     VALUE OF 0.48 ug/mL FEU,     THIS ASSAY HAS BEEN DOCUMENTED     IN THE LITERATURE TO HAVE     A SENSITIVITY AND NEGATIVE     PREDICTIVE VALUE OF AT LEAST     98 TO 99%.  THE TEST RESULT     SHOULD BE CORRELATED WITH     AN ASSESSMENT OF THE CLINICAL     PROBABILITY OF DVT / VTE.  URINE RAPID DRUG SCREEN (HOSP PERFORMED)     Status: None   Collection Time    07/21/14  4:42 PM      Result Value Ref Range   Opiates NONE DETECTED  NONE DETECTED   Cocaine NONE DETECTED  NONE DETECTED   Benzodiazepines NONE DETECTED  NONE DETECTED   Amphetamines NONE DETECTED  NONE DETECTED   Tetrahydrocannabinol NONE DETECTED  NONE DETECTED   Barbiturates NONE DETECTED  NONE DETECTED   Comment:            DRUG SCREEN FOR MEDICAL PURPOSES     ONLY.  IF CONFIRMATION IS NEEDED     FOR ANY PURPOSE, NOTIFY LAB      WITHIN 5 DAYS.                LOWEST DETECTABLE LIMITS     FOR URINE DRUG SCREEN     Drug Class       Cutoff (ng/mL)     Amphetamine      1000     Barbiturate      200     Benzodiazepine   211     Tricyclics       155     Opiates          300     Cocaine          300     THC              50  LIPASE, BLOOD     Status: None   Collection Time    07/21/14  5:07 PM      Result Value Ref Range   Lipase 28  11 - 59 U/L  ETHANOL     Status: None   Collection Time    07/21/14  5:07 PM      Result Value Ref Range   Alcohol, Ethyl (B) <11  0 - 11 mg/dL   Comment:            LOWEST DETECTABLE LIMIT FOR     SERUM ALCOHOL IS 11 mg/dL     FOR MEDICAL PURPOSES ONLY  HEPATIC FUNCTION PANEL     Status: None  Collection Time    07/21/14  5:07 PM      Result Value Ref Range   Total Protein 6.8  6.0 - 8.3 g/dL   Albumin 3.7  3.5 - 5.2 g/dL   AST 22  0 - 37 U/L   ALT 29  0 - 53 U/L   Alkaline Phosphatase 55  39 - 117 U/L   Total Bilirubin 0.4  0.3 - 1.2 mg/dL   Bilirubin, Direct <0.2  0.0 - 0.3 mg/dL   Indirect Bilirubin NOT CALCULATED  0.3 - 0.9 mg/dL   Dg Chest Port 1 View  07/21/2014   CLINICAL DATA:  Central chest pain, shortness of breath, dizziness and nausea.  EXAM: PORTABLE CHEST - 1 VIEW  COMPARISON:  11/20/2009  FINDINGS: The heart size and mediastinal contours are within normal limits. There is no evidence of pulmonary edema, consolidation, pneumothorax, nodule or pleural fluid. The visualized skeletal structures are unremarkable.  IMPRESSION: No active disease.   Electronically Signed   By: Aletta Edouard M.D.   On: 07/21/2014 14:18     Review of Systems  Constitutional: Negative for fever, activity change and appetite change.  HENT: Negative for congestion and rhinorrhea.  Eyes: Negative for discharge and itching.  Respiratory: Negative for shortness of breath and wheezing.  Cardiovascular: Positive for chest pain. Negative for claudication and syncope.  Gastrointestinal:  Negative for vomiting, diarrhea and constipation. Positive for abdominal pain and nausea Genitourinary: Negative for hematuria, decreased urine volume and difficulty urinating.  Musculoskeletal: Negative for back pain.  Skin: Negative for rash and wound.  Neurological: Negative for syncope, weakness and numbness.  All other systems reviewed and are negative.   Blood pressure 127/70, pulse 100, temperature 98.6 F (37 C), temperature source Oral, resp. rate 20, height 6' (1.829 m), weight 97.523 kg (215 lb), SpO2 96.00%. General exam: Moderately built and nourished pleasant young male patient, lying comfortably supine on the gurney in no obvious distress.  Head, eyes and ENT: Nontraumatic and normocephalic. Pupils equally reacting to light and accommodation. Oral mucosa slightly dry. Neck: Supple. No JVD, carotid bruit or thyromegaly.  Lymphatics: No lymphadenopathy.  Respiratory system: Clear to auscultation. No reproducible chest wall tenderness  Cardiovascular system: S1 and S2 heard, RRR. No JVD, murmurs, gallops, clicks or pedal edema. Gastrointestinal system: Abdomen is soft. Mild epigastric tenderness without rigidity, guarding or rebound. Normal bowel sounds heard. No organomegaly or masses appreciated.  Central nervous system: Alert and oriented. No focal neurological deficits.  Extremities: Symmetric 5 x 5 power. Peripheral pulses symmetrically felt. No tremors.  Skin: No rashes or acute findings. Face appears slightly flushed.   Psychiatry: Normal mood and affect.  Assessment/Plan Chest pain Abdominal pain Alcohol dependence Hypertension Dyslipidemia  Agree with r/o MI and nuclear stress test in AM.  Birdie Riddle, MD  07/21/2014, 6:09 PM

## 2014-07-22 ENCOUNTER — Observation Stay (HOSPITAL_COMMUNITY): Payer: 59

## 2014-07-22 DIAGNOSIS — R079 Chest pain, unspecified: Secondary | ICD-10-CM

## 2014-07-22 DIAGNOSIS — I517 Cardiomegaly: Secondary | ICD-10-CM

## 2014-07-22 LAB — CBC
HCT: 38.4 % — ABNORMAL LOW (ref 39.0–52.0)
Hemoglobin: 13.3 g/dL (ref 13.0–17.0)
MCH: 32.8 pg (ref 26.0–34.0)
MCHC: 34.6 g/dL (ref 30.0–36.0)
MCV: 94.8 fL (ref 78.0–100.0)
Platelets: 189 10*3/uL (ref 150–400)
RBC: 4.05 MIL/uL — ABNORMAL LOW (ref 4.22–5.81)
RDW: 12.7 % (ref 11.5–15.5)
WBC: 4.2 10*3/uL (ref 4.0–10.5)

## 2014-07-22 LAB — COMPREHENSIVE METABOLIC PANEL
ALT: 25 U/L (ref 0–53)
AST: 17 U/L (ref 0–37)
Albumin: 3.4 g/dL — ABNORMAL LOW (ref 3.5–5.2)
Alkaline Phosphatase: 54 U/L (ref 39–117)
Anion gap: 10 (ref 5–15)
BUN: 14 mg/dL (ref 6–23)
CO2: 27 mEq/L (ref 19–32)
Calcium: 9 mg/dL (ref 8.4–10.5)
Chloride: 101 mEq/L (ref 96–112)
Creatinine, Ser: 1.31 mg/dL (ref 0.50–1.35)
GFR calc Af Amer: 73 mL/min — ABNORMAL LOW (ref >90)
GFR calc non Af Amer: 63 mL/min — ABNORMAL LOW (ref >90)
Glucose, Bld: 111 mg/dL — ABNORMAL HIGH (ref 70–99)
Potassium: 4.1 mEq/L (ref 3.7–5.3)
Sodium: 138 mEq/L (ref 137–147)
Total Bilirubin: 0.5 mg/dL (ref 0.3–1.2)
Total Protein: 6.7 g/dL (ref 6.0–8.3)

## 2014-07-22 LAB — PROTIME-INR
INR: 1.11 (ref 0.00–1.49)
Prothrombin Time: 14.3 seconds (ref 11.6–15.2)

## 2014-07-22 LAB — TROPONIN I
Troponin I: <0.3 ng/mL (ref <0.30)
Troponin I: <0.3 ng/mL (ref <0.30)

## 2014-07-22 MED ORDER — REGADENOSON 0.4 MG/5ML IV SOLN
INTRAVENOUS | Status: AC
  Filled 2014-07-22: qty 5

## 2014-07-22 MED ORDER — PANTOPRAZOLE SODIUM 40 MG PO TBEC: 40.0000 mg | DELAYED_RELEASE_TABLET | Freq: Every day | ORAL | Status: DC

## 2014-07-22 MED ORDER — REGADENOSON 0.4 MG/5ML IV SOLN
0.4000 mg | Freq: Once | INTRAVENOUS | Status: DC
Start: 2014-07-22 — End: 2014-07-22
  Filled 2014-07-22: qty 5

## 2014-07-22 MED ORDER — TECHNETIUM TC 99M SESTAMIBI GENERIC - CARDIOLITE
30.0000 | Freq: Once | INTRAVENOUS | Status: AC | PRN
  Administered 2014-07-22: 30 via INTRAVENOUS

## 2014-07-22 MED ORDER — THIAMINE HCL 100 MG PO TABS: 100.0000 mg | ORAL_TABLET | Freq: Every day | ORAL | Status: DC

## 2014-07-22 MED ORDER — FOLIC ACID 1 MG PO TABS: 1.0000 mg | ORAL_TABLET | Freq: Every day | ORAL | Status: DC

## 2014-07-22 MED ORDER — TECHNETIUM TC 99M SESTAMIBI - CARDIOLITE
10.0000 | Freq: Once | INTRAVENOUS | Status: AC | PRN
  Administered 2014-07-22: 10 via INTRAVENOUS

## 2014-07-22 NOTE — Progress Notes (Signed)
Utilization Review Completed.   Lorrayne Ismael, RN, BSN Nurse Case Manager  

## 2014-07-22 NOTE — Progress Notes (Signed)
  Echocardiogram 2D Echocardiogram has been performed.  Calvin Wagner 07/22/2014, 9:07 AM

## 2014-07-22 NOTE — Consult Note (Signed)
Ref: [name], MD   Subjective:  Finished TMST part of nuclear stress test achieving target heart rate. No chest pain. No reversible ischemia on nuclear images.  Objective:  Vital Signs in the last 24 hours: Temp:  [97.8 F (36.6 C)-99.3 F (37.4 C)] 98.1 F (36.7 C) (10/11 1357) Pulse Rate:  [52-141] 92 (10/11 1357) Cardiac Rhythm:  [-] Normal sinus rhythm (10/11 0736) Resp:  [12-23] 20 (10/11 1357) BP: (110-177)/(64-95) 122/76 mmHg (10/11 1357) SpO2:  [94 %-100 %] 100 % (10/11 1357) Weight:  [99.6 kg (219 lb 9.3 oz)] 99.6 kg (219 lb 9.3 oz) (10/10 1845)  Physical Exam: BP Readings from Last 1 Encounters:  07/22/14 122/76    Wt Readings from Last 1 Encounters:  07/21/14 99.6 kg (219 lb 9.3 oz)    Weight change:   HEENT: [place]/AT, Eyes- Conjunctiva-Pink, Sclera-Non-icteric Neck: No JVD, No bruit, Trachea midline. Lungs:  Clear, Bilateral. Cardiac:  Regular rhythm, normal S1 and S2, no S3.  Abdomen:  Soft, non-tender. Extremities:  No edema present. No cyanosis. No clubbing. CNS: AxOx3, Cranial nerves grossly intact, moves all 4 extremities.  Skin: Warm and dry.   Intake/Output from previous day: 10/10 0701 - 10/11 0700 In: 1310 [P.O.:600; I.V.:710] Out: -     Lab Results: BMET    Component Value Date/Time   NA 138 07/22/2014 0654   NA 135* 07/21/2014 1358   NA 138 10/26/2012 1550   K 4.1 07/22/2014 0654   K 4.0 07/21/2014 1358   K 3.6 10/26/2012 1550   CL 101 07/22/2014 0654   CL 96 07/21/2014 1358   CL 102 10/26/2012 1550   CO2 27 07/22/2014 0654   CO2 22 07/21/2014 1358   CO2 29 10/26/2012 1550   GLUCOSE 111* 07/22/2014 0654   GLUCOSE 102* 07/21/2014 1358   GLUCOSE 99 10/26/2012 1550   BUN 14 07/22/2014 0654   BUN 16 07/21/2014 1358   BUN 19 10/26/2012 1550   CREATININE 1.31 07/22/2014 0654   CREATININE 1.09 07/21/2014 1358   CREATININE 1.2 10/26/2012 1550   CALCIUM 9.0 07/22/2014 0654   CALCIUM 10.4 07/21/2014 1358   CALCIUM 9.7 10/26/2012 1550   GFRNONAA 63* 07/22/2014 0654   GFRNONAA 79* 07/21/2014 1358   GFRNONAA 58.80 11/08/2009 0000   GFRAA 73* 07/22/2014 0654   GFRAA >90 07/21/2014 1358   GFRAA  Value: >60        The eGFR has been calculated using the MDRD equation. This calculation has not been validated in all clinical 01/05/2008 0535   CBC    Component Value Date/Time   WBC 4.2 07/22/2014 0654   RBC 4.05* 07/22/2014 0654   HGB 13.3 07/22/2014 0654   HCT 38.4* 07/22/2014 0654   PLT 189 07/22/2014 0654   MCV 94.8 07/22/2014 0654   MCH 32.8 07/22/2014 0654   MCHC 34.6 07/22/2014 0654   RDW 12.7 07/22/2014 0654   LYMPHSABS 1.2 11/08/2009 0000   MONOABS 0.4 11/08/2009 0000   EOSABS 0.2 11/08/2009 0000   BASOSABS 0.0 11/08/2009 0000   HEPATIC Function Panel  Recent Labs  07/21/14 1707 07/22/14 0654  PROT 6.8 6.7   HEMOGLOBIN A1C No components found with this basename: HGA1C,  MPG   CARDIAC ENZYMES Lab Results  Component Value Date   TROPONINI <0.30 07/22/2014   TROPONINI <0.30 07/22/2014   TROPONINI <0.30 07/21/2014   BNP No results found for this basename: PROBNP,  in the last 8760 hours TSH No results found for this basename: TSH,  in the last 8760 hours CHOLESTEROL No results found for this basename: CHOL,  in the last 8760 hours  Scheduled Meds: . aspirin EC  325 mg Oral Daily  . enoxaparin (LOVENOX) injection  40 mg Subcutaneous Q24H  . folic acid  1 mg Oral Daily  . lisinopril  10 mg Oral Daily   And  . hydrochlorothiazide  12.5 mg Oral Daily  . multivitamin with minerals  1 tablet Oral Daily  . nitroGLYCERIN  0.5 inch Topical 4 times per day  . pantoprazole (PROTONIX) IV  40 mg Intravenous Q24H  . sodium chloride  3 mL Intravenous Q12H  . thiamine  100 mg Oral Daily   Or  . thiamine  100 mg Intravenous Daily   Continuous Infusions:  PRN Meds:.acetaminophen, acetaminophen, albuterol, alum & mag hydroxide-simeth, HYDROcodone-acetaminophen, LORazepam, LORazepam, morphine injection, ondansetron  (ZOFRAN) IV, ondansetron  Assessment/Plan: Chest pain  Abdominal pain  Alcohol dependence  Hypertension  Dyslipidemia  Continue medical treatment and non-cardiac chest pain evaluation     LOS: 1 day    Dixie Dials  MD  07/22/2014, 3:54 PM

## 2014-07-22 NOTE — Discharge Summary (Signed)
Physician Discharge Summary  Calvin Wagner JOI:[id number] DOB: 1966-01-13 DOA: 07/21/2014  PCP: [name], MD  Admit date: 07/21/2014 Discharge date: 07/22/2014  Time spent: Less than 30 minutes  Recommendations for Outpatient Follow-up:  1. Dr. [name], PCP in 2 days with repeat labs (BMP). 2. Consider outpatient gastroenterology consultation.  Discharge Diagnoses:  Principal Problem:   Chest pain Active Problems:   Essential hypertension   GERD   Alcohol dependence   Abdominal pain   Chest pain, atypical   Chest pain at rest   Discharge Condition: Improved & Stable  Diet recommendation: Heart healthy diet  Filed Weights   07/21/14 1324 07/21/14 1845  Weight: 97.523 kg (215 lb) 99.6 kg (219 lb 9.3 oz)    History of present illness & hospital course:  48 year old male patient with history of hypertension, hyperlipidemia, GERD, alcohol dependence, strong family history of CAD presented to the ED on 07/21/14 with complaints of chest and abdominal pain. Patient was at a party the night prior to admission when he states that he consumed 6-8 beers. On the morning of admission, he started experiencing midsternal sharp chest pains associated with nausea, dry heaves, left arm pain/tingling and tingling of both hands without associated dyspnea. He also gave history of intermittent chest pains at rest and with activity for the last 2-3 weeks for which he had been seen by his PCP and there were plans for? Event monitor placement. In addition to this he complained of worsened epigastric pain. He gave one year history of intermittent similar abdominal pains that were aggravated and relieved by food, relieved by Gas-X, no weight loss and intermittent nausea without vomiting or melena or bleeding. He was admitted to telemetry which did not show arrhythmia alarms. Due to concern for CAD from strong family history and presenting symptoms, troponins were cycled and negative. EKG was without  acute findings. Cardiology was consulted and performed stress test which was low risk. Echo unremarkable. His chest pain and abdominal pain and other symptoms have resolved. He has tolerated diet. All his presentation was likely secondary to excess alcohol intake and associated GI symptoms. He may warrant an OP GI consultation for further evaluation by EGD to rule out gastritis/esophagitis/PUD or other etiologies. Patient has been counseled extensively regarding abstinence from alcohol and he verbalizes understanding. His LFTs and lipase were unremarkable. He will be discharged on short course of PPI. He was placed on Ativan protocol but has not demonstrate any features of withdrawal. His creatinine has mildly increased to 1.3 which may be secondary to poor oral intake. He has been advised to drink plenty of liquids and followup with PCP in 2 days for repeat labs. He has been advised to avoid NSAIDs. Discussed with cardiology who have cleared him for discharge home.    Consultations:  Cardiology  Procedures:  None    Discharge Exam:  Complaints:  Feels much better. Denies chest pain, abdominal pain, nausea or vomiting. Tolerating diet.  Filed Vitals:   07/22/14 1031 07/22/14 1033 07/22/14 1036 07/22/14 1357  BP: 177/88 176/75 163/80 122/76  Pulse: 134 141 126 92  Temp:    98.1 F (36.7 C)  TempSrc:    Oral  Resp:    20  Height:      Weight:      SpO2:    100%    General exam: Pleasant young male lying comfortably in bed. Respiratory system: Clear. No increased work of breathing. Cardiovascular system: S1 & S2 heard, RRR. No  JVD, murmurs, gallops, clicks or pedal edema. Telemetry: SR-SB in 50's Gastrointestinal system: Abdomen is nondistended, soft and nontender. Normal bowel sounds heard. Central nervous system: Alert and oriented. No focal neurological deficits. Extremities: Symmetric 5 x 5 power.  Discharge Instructions      Discharge Instructions   Activity as tolerated  - No restrictions    Complete by:  As directed      Call MD for:  persistant nausea and vomiting    Complete by:  As directed      Call MD for:  severe uncontrolled pain    Complete by:  As directed      Diet - low sodium heart healthy    Complete by:  As directed             Medication List         aspirin EC 81 MG tablet  Take 81 mg by mouth daily.     folic acid 1 MG tablet  Commonly known as:  FOLVITE  Take 1 tablet (1 mg total) by mouth daily.     lisinopril-hydrochlorothiazide 10-12.5 MG per tablet  Commonly known as:  PRINZIDE,ZESTORETIC  Take 1 tablet by mouth daily.     multivitamin with minerals Tabs tablet  Take 1 tablet by mouth daily.     pantoprazole 40 MG tablet  Commonly known as:  PROTONIX  Take 1 tablet (40 mg total) by mouth daily.     thiamine 100 MG tablet  Take 1 tablet (100 mg total) by mouth daily.       Follow-up Information   Follow up with [name], MD. Schedule an appointment as soon as possible for a visit in 2 days. (To be seen with repeat labs (BMP).)    Specialty:  Family Medicine   Contact information:   [address] [place] [address] [phone]        The results of significant diagnostics from this hospitalization (including imaging, microbiology, ancillary and laboratory) are listed below for reference.    Significant Diagnostic Studies: Nm Myocar Multi W/spect W/wall Motion / Ef  07/22/2014   CLINICAL DATA:  Chest pain.  Hypertension  EXAM: MYOCARDIAL IMAGING WITH SPECT (REST AND EXERCISE)  GATED LEFT VENTRICULAR WALL MOTION STUDY  LEFT VENTRICULAR EJECTION FRACTION  TECHNIQUE: Standard myocardial SPECT imaging was performed after resting intravenous injection of 10 mCi Tc-16m sestamibi. Subsequently, exercise tolerance test was performed by the patient under the supervision of the Cardiology staff. At peak-stress, 30 mCi Tc-67m sestamibi was injected intravenously and standard myocardial SPECT  imaging was performed. Quantitative gated imaging was also performed to evaluate left ventricular wall motion, and estimate left ventricular ejection fraction.  COMPARISON:  None.  FINDINGS: Perfusion: No decreased activity in the left ventricle on stress imaging to suggest reversible ischemia or infarction.  Wall Motion: Normal left ventricular wall motion. No left ventricular dilation.  Left Ventricular Ejection Fraction: 72 %  End diastolic volume 76 ml  End systolic volume 21 ml  IMPRESSION: 1. No reversible ischemia or infarction.  2. Normal left ventricular wall motion.  3. Left ventricular ejection fraction 72%  4. Low-risk stress test findings*.  *2012 Appropriate Use Criteria for Coronary Revascularization Focused Update: J Am Coll Cardiol. 4967;59(1):638-466. http://content.airportbarriers.com.aspx?articleid=1201161   Electronically Signed   By: Earle Gell M.D.   On: 07/22/2014 13:50   Dg Chest Port 1 View  07/21/2014   CLINICAL DATA:  Central chest pain, shortness of breath, dizziness  and nausea.  EXAM: PORTABLE CHEST - 1 VIEW  COMPARISON:  11/20/2009  FINDINGS: The heart size and mediastinal contours are within normal limits. There is no evidence of pulmonary edema, consolidation, pneumothorax, nodule or pleural fluid. The visualized skeletal structures are unremarkable.  IMPRESSION: No active disease.   Electronically Signed   By: Aletta Edouard M.D.   On: 07/21/2014 14:18    Microbiology: No results found for this or any previous visit (from the past 240 hour(s)).   Labs: Basic Metabolic Panel:  Recent Labs Lab 07/21/14 1358 07/22/14 0654  NA 135* 138  K 4.0 4.1  CL 96 101  CO2 22 27  GLUCOSE 102* 111*  BUN 16 14  CREATININE 1.09 1.31  CALCIUM 10.4 9.0   Liver Function Tests:  Recent Labs Lab 07/21/14 1707 07/22/14 0654  AST 22 17  ALT 29 25  ALKPHOS 55 54  BILITOT 0.4 0.5  PROT 6.8 6.7  ALBUMIN 3.7 3.4*    Recent Labs Lab 07/21/14 1707  LIPASE 28   No  results found for this basename: AMMONIA,  in the last 168 hours CBC:  Recent Labs Lab 07/21/14 1358 07/22/14 0654  WBC 7.2 4.2  HGB 14.5 13.3  HCT 39.5 38.4*  MCV 92.1 94.8  PLT 232 189   Cardiac Enzymes:  Recent Labs Lab 07/21/14 2037 07/22/14 0100 07/22/14 0654  TROPONINI <0.30 <0.30 <0.30   BNP: BNP (last 3 results) No results found for this basename: PROBNP,  in the last 8760 hours CBG: No results found for this basename: GLUCAP,  in the last 168 hours  Additional labs: 1. 2-D echo 07/22/14: Study Conclusions  - Left ventricle: The cavity size was normal. Wall thickness was increased in a pattern of mild LVH. The estimated ejection fraction was 55%. Wall motion was normal; there were no regional wall motion abnormalities. Left ventricular diastolic function parameters were normal. - Ascending aorta: The ascending aorta was ectatic. - Tricuspid valve: There was trivial regurgitation. - Pulmonary arteries: Systolic pressure could not be accurately estimated. - Inferior vena cava: Poorly visualized. Unable to accurately estimate CVP. - Pericardium, extracardiac: There was no pericardial effusion.  Impressions:  - Mild LVH with LVEF 32%, normal diastolic function. Ectatic ascending aorta. Trivial tricuspid regurgitation, unable to assess PASP. No pericardial effusion    Signed:  Vernell Leep, MD, FACP, FHM. [facility] Pager [phone]  If 7PM-7AM, please contact night-coverage [email or web address] Password [facility] 07/22/2014, 3:38 PM

## 2014-07-23 ENCOUNTER — Encounter: Payer: Self-pay | Admitting: *Deleted

## 2014-07-26 ENCOUNTER — Encounter: Payer: Self-pay | Admitting: *Deleted

## 2014-07-26 NOTE — Progress Notes (Signed)
Patient ID: Calvin Wagner, male   DOB: 1966/08/24, 48 y.o.   MRN: [id number] Patient did not show up for 07/26/2014 4:00pm appointment to have a 48 hour holter monitor applied.

## 2014-08-14 ENCOUNTER — Other Ambulatory Visit: Payer: Self-pay | Admitting: Gastroenterology

## 2014-08-14 DIAGNOSIS — R1310 Dysphagia, unspecified: Secondary | ICD-10-CM

## 2014-08-16 ENCOUNTER — Ambulatory Visit: Payer: 59 | Admitting: Interventional Cardiology

## 2014-08-17 ENCOUNTER — Ambulatory Visit
Admission: RE | Admit: 2014-08-17 | Discharge: 2014-08-17 | Disposition: A | Discharge status: Home / Self Care | Payer: 59 | Source: Ambulatory Visit | Attending: Gastroenterology | Admitting: Gastroenterology

## 2014-08-17 DIAGNOSIS — R1310 Dysphagia, unspecified: Secondary | ICD-10-CM

## 2014-09-04 ENCOUNTER — Encounter: Payer: Self-pay | Admitting: Interventional Cardiology

## 2015-04-16 IMAGING — RF DG ESOPHAGUS
16 of 19 series · 20 of 24 positions shown · non-contrast
Comparison: None.

CLINICAL DATA: Dysphagia.

EXAM:
ESOPHOGRAM / BARIUM SWALLOW / BARIUM TABLET STUDY
TECHNIQUE: Combined double contrast and single contrast examination performed
using effervescent crystals, thick barium liquid, and thin barium
liquid. The patient was observed with fluoroscopy swallowing a 13mm
barium sulphate tablet.
FLUOROSCOPY TIME:  There min 48 seconds.

[Series 1: run · 3 of 4 slices shown (1 of 16)]
[im 1/4]
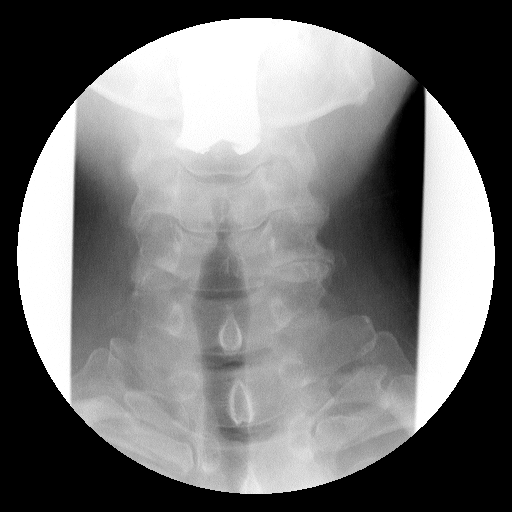
[im 2/4]
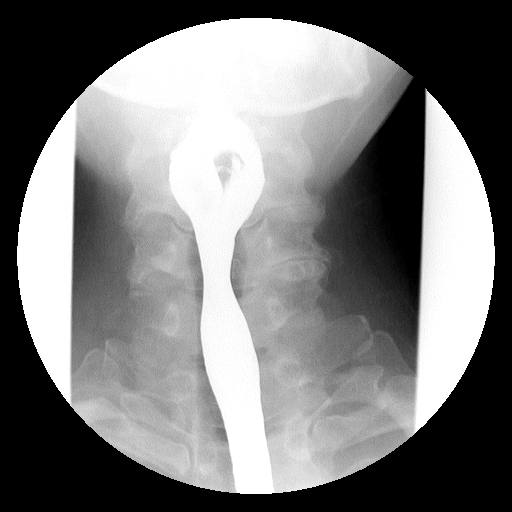
[im 4/4]
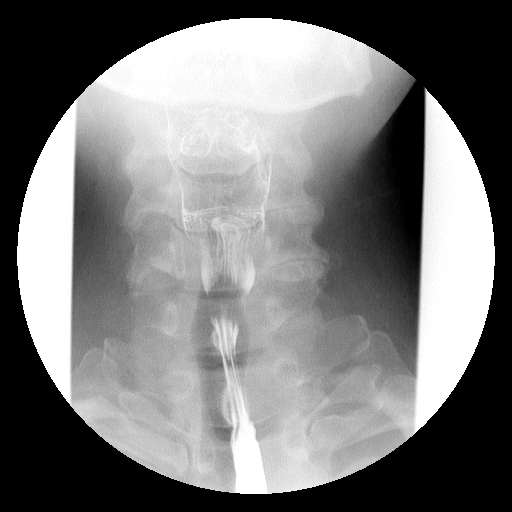

[Series 2: run · 1 of 1 slices shown (2 of 16)]
[im 1/1]
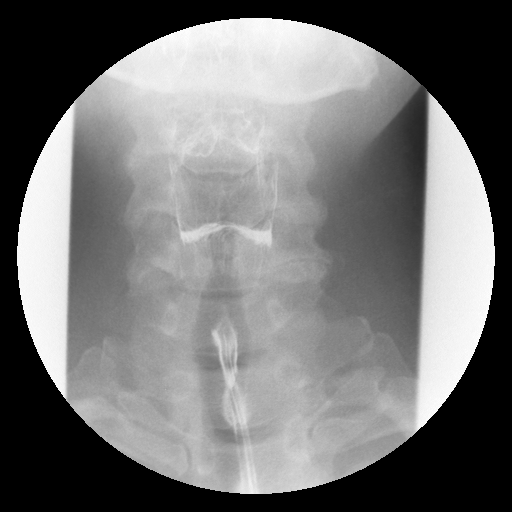

[Series 3: run · 3 of 3 slices shown (3 of 16)]
[im 1/3]
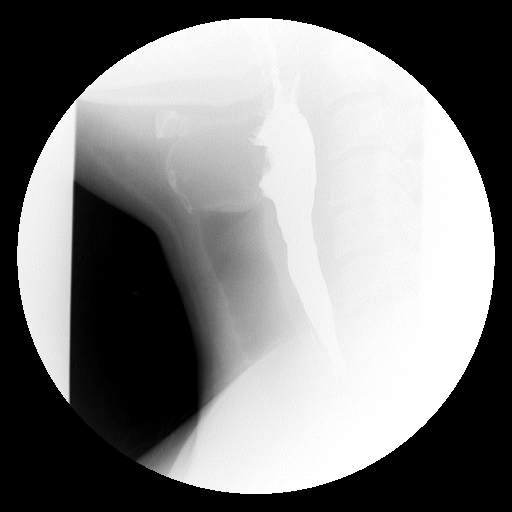
[im 2/3]
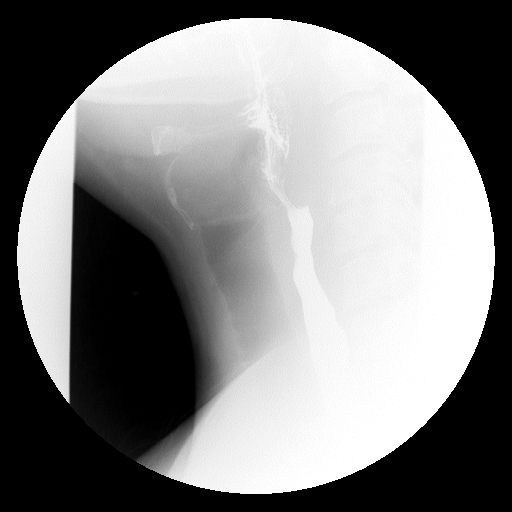
[im 3/3]
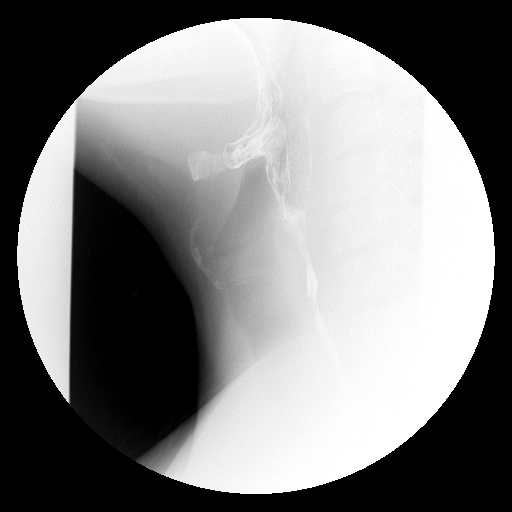

[Series 5: run · 1 of 1 slices shown (4 of 16)]
[im 1/1]
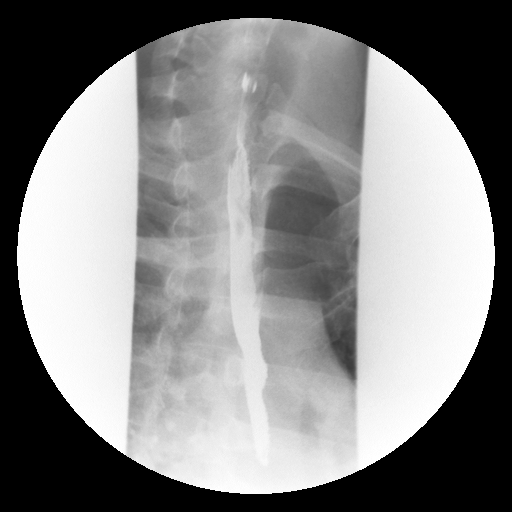

[Series 6: run · 1 of 1 slices shown (5 of 16)]
[im 1/1]
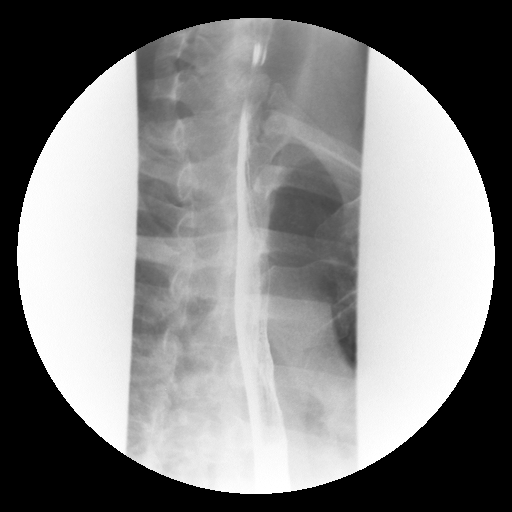

[Series 7: run · 1 of 1 slices shown (6 of 16)]
[im 1/1]
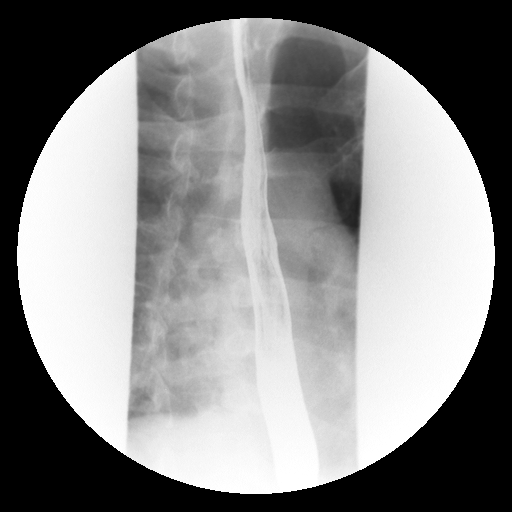

[Series 8: run · 1 of 1 slices shown (7 of 16)]
[im 1/1]
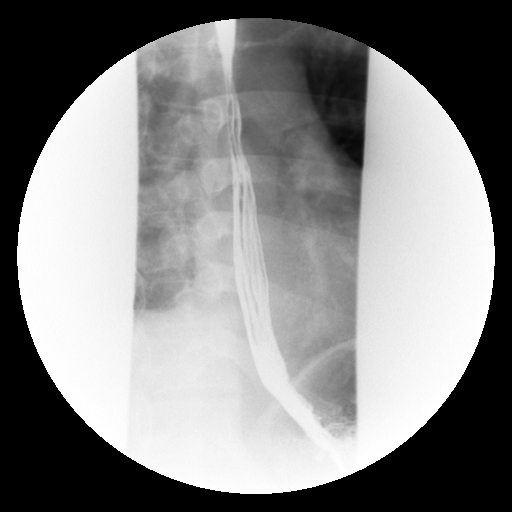

[Series 9: run · 1 of 1 slices shown (8 of 16)]
[im 1/1]
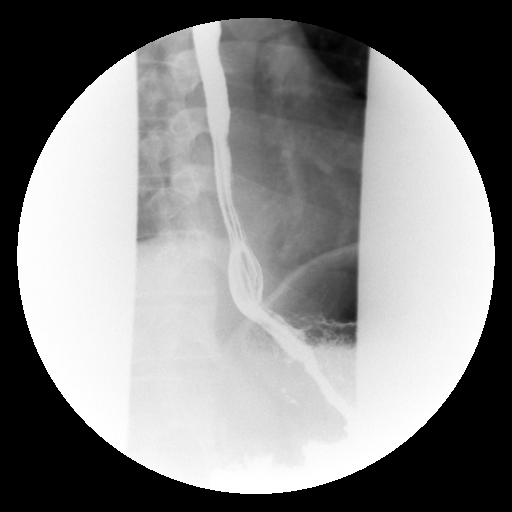

[Series 11: run · 1 of 1 slices shown (9 of 16)]
[im 1/1]
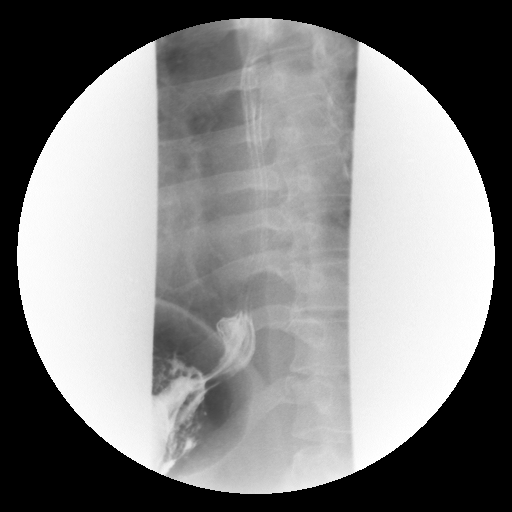

[Series 12: run · 1 of 1 slices shown (10 of 16)]
[im 1/1]
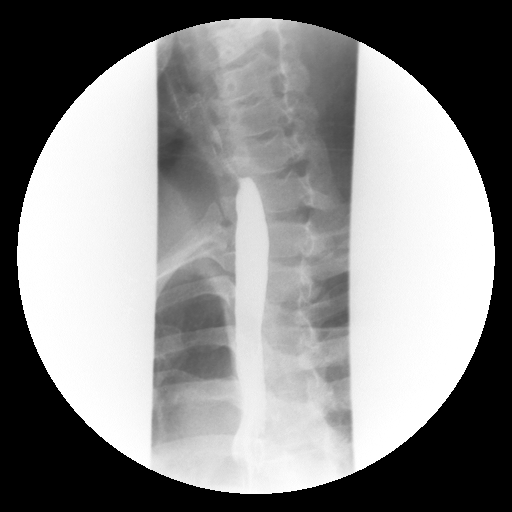

[Series 13: run · 1 of 1 slices shown (11 of 16)]
[im 1/1]
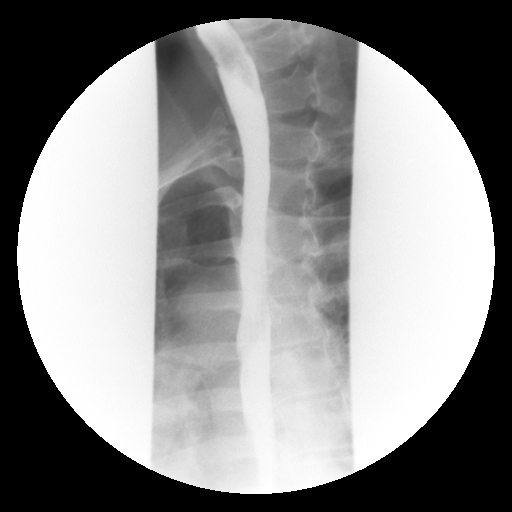

[Series 14: run · 1 of 1 slices shown (12 of 16)]
[im 1/1]
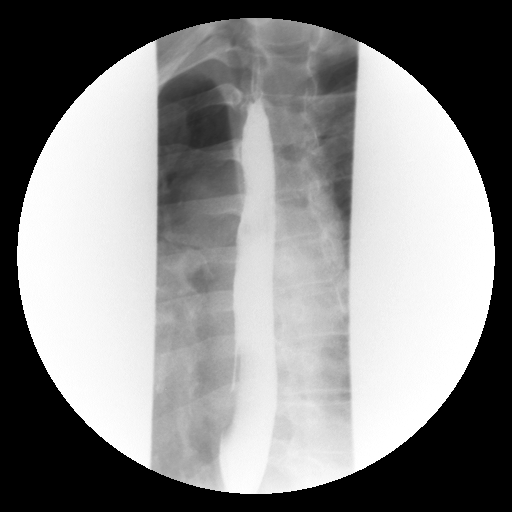

[Series 15: run · 1 of 1 slices shown (13 of 16)]
[im 1/1]
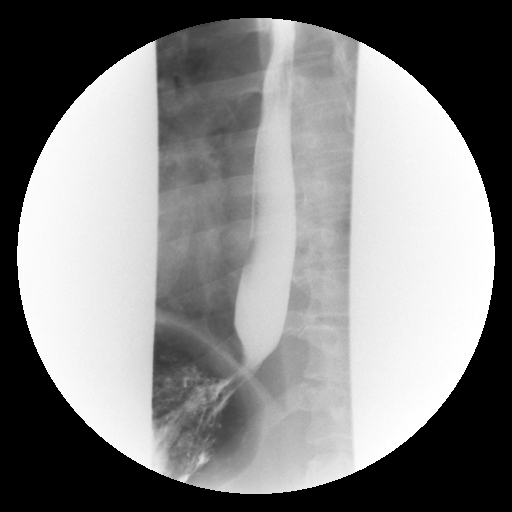

[Series 17: run · 1 of 1 slices shown (14 of 16)]
[im 1/1]
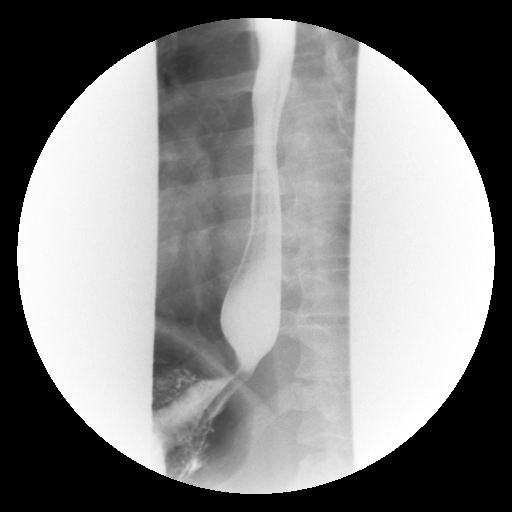

[Series 18: run · 1 of 1 slices shown (15 of 16)]
[im 1/1]
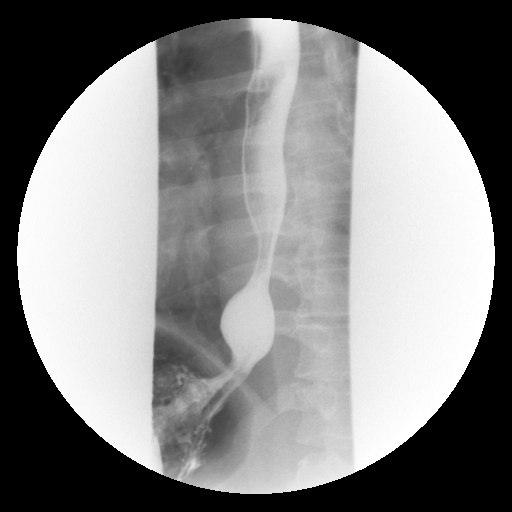

[Series 19: run · 1 of 1 slices shown (16 of 16)]
[im 1/1]
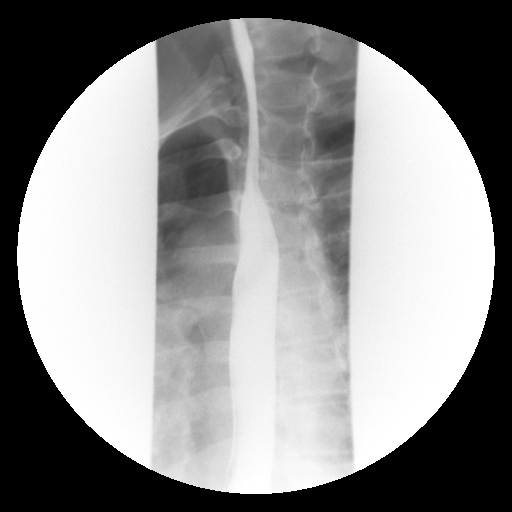

[20 of 24 positions shown; findings below may reference images not displayed]

FINDINGS: Swallowing mechanism and esophageal motility are normal. No
esophageal fold thickening, stricture or obstruction. A 13 mm barium
pill passed into the stomach without difficulty.
IMPRESSION: Normal study.

## 2015-10-10 ENCOUNTER — Encounter: Payer: Self-pay | Admitting: *Deleted

## 2015-10-10 ENCOUNTER — Encounter: Payer: Self-pay | Admitting: Neurology

## 2015-10-10 ENCOUNTER — Ambulatory Visit (INDEPENDENT_AMBULATORY_CARE_PROVIDER_SITE_OTHER): Payer: 59 | Admitting: Neurology

## 2015-10-10 VITALS — BP 134/84 | HR 82 | Ht 72.0 in | Wt 223.0 lb

## 2015-10-10 DIAGNOSIS — G4710 Hypersomnia, unspecified: Secondary | ICD-10-CM | POA: Insufficient documentation

## 2015-10-10 DIAGNOSIS — H539 Unspecified visual disturbance: Secondary | ICD-10-CM | POA: Diagnosis not present

## 2015-10-10 DIAGNOSIS — R112 Nausea with vomiting, unspecified: Secondary | ICD-10-CM

## 2015-10-10 DIAGNOSIS — R42 Dizziness and giddiness: Secondary | ICD-10-CM | POA: Diagnosis not present

## 2015-10-10 DIAGNOSIS — R0683 Snoring: Secondary | ICD-10-CM

## 2015-10-10 DIAGNOSIS — R51 Headache: Secondary | ICD-10-CM | POA: Diagnosis not present

## 2015-10-10 DIAGNOSIS — G8929 Other chronic pain: Secondary | ICD-10-CM

## 2015-10-10 DIAGNOSIS — R519 Headache, unspecified: Secondary | ICD-10-CM | POA: Insufficient documentation

## 2015-10-10 MED ORDER — TOPIRAMATE 50 MG PO TABS: 50.0000 mg | ORAL_TABLET | Freq: Every day | ORAL | Status: DC

## 2015-10-10 NOTE — Progress Notes (Signed)
[facility]    Provider:  Dr [name] Referring Provider: [name], MD Primary Care Physician:  [name], MD  CC:  migraines  HPI:  Calvin Wagner is a 49 y.o. male here as a referral from Dr. Orland Mustard for migraines. Past medical history of hypertension and migraines. Headaches started since high school. No inciting events.  As he got older they got worse. Nausea is getting worse and the dizziness. Nausea can be severe, he vomits. Headaches are on the right side, throbbing or sharp. They can last continuously for over a week. Comes and goes. Caffeine makes it worse, trigger. He can go months without a headache or he can have 2 a month which each last a week. So some months he can have up to 14 days of migraines. He has light and sound sensitivity, he has to put cotton in his ears. He needs to sit still with the headaches, if he walks it is worse. Sleeping makes it better. Sumatriptan doesn't really work. He has never tried any daily medication. Chocolate is a trigger. He snores. He wake up with headache. He wakes up with a dry mouth. He is really tired during the day, he could lay down at any time and fall asleep. He goes to bed at 10pm and wakes up at 5am. He falls asleep easily but still very tired. No apneic events as far as he knows. He has not gained weight but he is obese at BMI of 30. Mother with migraines. He has had an aura once.   Reviewed notes, labs and imaging from outside physicians, which showed: Reviewed notes from [facility] in audiology. He has a past medical history of migraine, hypertension, tinnitus and nausea. He had a normal audiogram with slight drop-off at the highest frequencies but still within normal limits. His problems may be migraine related.   Review of Systems: Patient complains of symptoms per HPI as well as the following symptoms: Fatigue, eye pain, shortness of breath, ringing in ears, headache,  dizziness. Pertinent negatives per HPI. All others negative.   Social History   Social History  . Marital Status: Married    Spouse Name: Baker Janus  . Number of Children: 2  . Years of Education: N/A   Occupational History  . Material [employer]   Social History Main Topics  . Smoking status: Never Smoker   . Smokeless tobacco: Never Used  . Alcohol Use: 6.6 - 7.8 oz/week    5 Glasses of wine, 6-8 Cans of beer per week     Comment: socially  . Drug Use: No  . Sexual Activity: Not Currently   Other Topics Concern  . Not on file   Social History Narrative   Lives at home wife and 2 kids   Caffeine use: none      Family History  Problem Relation Age of Onset  . Lung cancer      family history  . CAD      strong family history male and male <50  . Mental retardation    . Alcohol abuse Neg Hx   . Diabetes Neg Hx   . Early death Neg Hx   . Hyperlipidemia Neg Hx   . Hypertension Neg Hx   . Kidney disease Neg Hx   . Stroke Neg Hx   . Migraines Neg Hx   . Heart attack Brother 6  . Heart disease Mother 55    Past  Medical History  Diagnosis Date  . Migraine 02/25/2012  . GERD 11/08/2009    Qualifier: Diagnosis of  By: Ronnald Ramp MD, Arvid Right.   . HYPERTENSION 11/08/2009    Qualifier: Diagnosis of  By: Ronnald Ramp MD, Arvid Right.     Past Surgical History  Procedure Laterality Date  . Inguinal hernia repair    . Total knee arthroplasty      Current Outpatient Prescriptions  Medication Sig Dispense Refill  . aspirin EC 81 MG tablet Take 81 mg by mouth daily.    Marland Kitchen lisinopril-hydrochlorothiazide (PRINZIDE,ZESTORETIC) 10-12.5 MG per tablet Take 1 tablet by mouth daily.    . Multiple Vitamin (MULTIVITAMIN WITH MINERALS) TABS tablet Take 1 tablet by mouth daily.    . SUMAtriptan (IMITREX) 50 MG tablet TAKE 1 TABLET BY MOUTH AS NEEDED AT START OF MIGRAINE. MAY REPEAT 1 DOSE IN 1HR IF NEEDED  2  . topiramate (TOPAMAX) 50 MG tablet Take 1 tablet (50 mg total) by mouth  at bedtime. 30 tablet 6   No current facility-administered medications for this visit.    Allergies as of 10/10/2015  . (No Known Allergies)    Vitals: BP 134/84 mmHg  Pulse 82  Ht 6' (1.829 m)  Wt 223 lb (101.152 kg)  BMI 30.24 kg/m2 Last Weight:  Wt Readings from Last 1 Encounters:  10/10/15 223 lb (101.152 kg)   Last Height:   Ht Readings from Last 1 Encounters:  10/10/15 6' (1.829 m)   Physical exam: Exam: Gen: NAD, conversant, well nourised, obese, well groomed                     CV: RRR, no MRG. No Carotid Bruits. No peripheral edema, warm, nontender Eyes: Conjunctivae clear without exudates or hemorrhage  Neuro: Detailed Neurologic Exam  Speech:    Speech is normal; fluent and spontaneous with normal comprehension.  Cognition:    The patient is oriented to person, place, and time;     recent and remote memory intact;     language fluent;     normal attention, concentration,     fund of knowledge Cranial Nerves:    The pupils are equal, round, and reactive to light. The fundi are normal and spontaneous venous pulsations are present. Visual fields are full to finger confrontation. Extraocular movements are intact. Trigeminal sensation is intact and the muscles of mastication are normal. The face is symmetric. The palate elevates in the midline. Hearing intact. Voice is normal. Shoulder shrug is normal. The tongue has normal motion without fasciculations.   Coordination:    Normal finger to nose and heel to shin. Normal rapid alternating movements.   Gait:    Heel-toe and tandem gait are normal.   Motor Observation:    No asymmetry, no atrophy, and no involuntary movements noted. Tone:    Normal muscle tone.    Posture:    Posture is normal. normal erect    Strength:    Strength is V/V in the upper and lower limbs.      Sensation: intact to LT     Reflex Exam:  DTR's:    Deep tendon reflexes in the upper and lower extremities are normal  bilaterally.   Toes:    The toes are downgoing bilaterally.   Clonus:    Clonus is absent.       Assessment/Plan:  49 year old male here for new evaluation of migraines.  As far as your medications are concerned, I would like  to suggest: Topiramate. Start with 1/2 tab at night then increase to a whole tablet at night.   As far as diagnostic testing: MRI of the brain, labs today  Fatigue: Epworth sleepiness scale was an 8, will hold off on a referral for sleep evaluation at this time.  To prevent or relieve headaches, try the following: Cool Compress. Lie down and place a cool compress on your head.  Avoid headache triggers. If certain foods or odors seem to have triggered your migraines in the past, avoid them. A headache diary might help you identify triggers.  Include physical activity in your daily routine. Try a daily walk or other moderate aerobic exercise.  Manage stress. Find healthy ways to cope with the stressors, such as delegating tasks on your to-do list.  Practice relaxation techniques. Try deep breathing, yoga, massage and visualization.  Eat regularly. Eating regularly scheduled meals and maintaining a healthy diet might help prevent headaches. Also, drink plenty of fluids.  Follow a regular sleep schedule. Sleep deprivation might contribute to headaches Consider biofeedback. With this mind-body technique, you learn to control certain bodily functions - such as muscle tension, heart rate and blood pressure - to prevent headaches or reduce headache pain.    Proceed to emergency room if you experience new or worsening symptoms or symptoms do not resolve, if you have new neurologic symptoms or if headache is severe, or for any concerning symptom.    Discussed Common side effects of Topamax including  tiredness,  drowsiness,  dizziness,  nervousness,  numbness or tingly feeling,  coordination problems,  diarrhea,  weight loss, Serious side effects can include    Abdominal or stomach pain  fever, chills, or sore throat  lessening of sensations or perception  loss of appetite  mood or mental changes, including aggression, agitation, apathy, irritability, and mental depression  red, irritated, or bleeding gums  weight loss  CC: Tiffany Kocher, MD  [facility] [address] [place], [place] [address]  Phone [phone] Fax [phone]

## 2015-10-10 NOTE — Patient Instructions (Addendum)
Overall you are doing fairly well but I do want to suggest a few things today:   Remember to drink plenty of fluid, eat healthy meals and do not skip any meals. Try to eat protein with a every meal and eat a healthy snack such as fruit or nuts in between meals. Try to keep a regular sleep-wake schedule and try to exercise daily, particularly in the form of walking, 20-30 minutes a day, if you can.   As far as your medications are concerned, I would like to suggest: Topiramate. Start with 1/2 tab at night then increase to a whole tablet at night.   As far as diagnostic testing: MRI of the brain, lab  Our phone number is [phone]. We also have an after hours call service for urgent matters and there is a physician on-call for urgent questions. For any emergencies you know to call 911 or go to the nearest emergency room

## 2015-10-11 ENCOUNTER — Encounter: Payer: Self-pay | Admitting: Neurology

## 2015-10-11 LAB — COMPREHENSIVE METABOLIC PANEL
ALT: 39 IU/L (ref 0–44)
AST: 25 IU/L (ref 0–40)
Albumin/Globulin Ratio: 1.6 (ref 1.1–2.5)
Albumin: 4.5 g/dL (ref 3.5–5.5)
Alkaline Phosphatase: 63 IU/L (ref 39–117)
BUN/Creatinine Ratio: 13 (ref 9–20)
BUN: 14 mg/dL (ref 6–24)
Bilirubin Total: 0.4 mg/dL (ref 0.0–1.2)
CO2: 23 mmol/L (ref 18–29)
Calcium: 10.1 mg/dL (ref 8.7–10.2)
Chloride: 97 mmol/L (ref 96–106)
Creatinine, Ser: 1.06 mg/dL (ref 0.76–1.27)
GFR calc Af Amer: 95 mL/min/{1.73_m2} (ref >59)
GFR calc non Af Amer: 83 mL/min/{1.73_m2} (ref >59)
Globulin, Total: 2.8 g/dL (ref 1.5–4.5)
Glucose: 89 mg/dL (ref 65–99)
Potassium: 5.3 mmol/L — ABNORMAL HIGH (ref 3.5–5.2)
Sodium: 139 mmol/L (ref 134–144)
Total Protein: 7.3 g/dL (ref 6.0–8.5)

## 2015-10-15 ENCOUNTER — Telehealth: Payer: Self-pay | Admitting: *Deleted

## 2015-10-15 NOTE — Telephone Encounter (Signed)
LVM for pt to call about results. Ok to inform pt labs unremarkable per Dr [name]. Gave [facility] phone number.

## 2015-10-15 NOTE — Telephone Encounter (Signed)
-----   Message from Melvenia Beam, MD sent at 10/11/2015  8:36 PM EST ----- Patient's labs unremarkable thanks

## 2015-10-23 NOTE — Telephone Encounter (Signed)
LVM for pt to call about results. Gave [facility] phone number.  

## 2015-10-24 NOTE — Telephone Encounter (Signed)
I advised the patient that his lab results were unremarkable.

## 2016-01-06 ENCOUNTER — Ambulatory Visit (INDEPENDENT_AMBULATORY_CARE_PROVIDER_SITE_OTHER): Payer: 59 | Admitting: Adult Health

## 2016-01-06 ENCOUNTER — Encounter: Payer: Self-pay | Admitting: Adult Health

## 2016-01-06 ENCOUNTER — Ambulatory Visit: Payer: 59 | Admitting: Neurology

## 2016-01-06 VITALS — BP 122/85 | HR 85 | Ht 72.0 in | Wt 230.0 lb

## 2016-01-06 DIAGNOSIS — G43009 Migraine without aura, not intractable, without status migrainosus: Secondary | ICD-10-CM

## 2016-01-06 NOTE — Progress Notes (Addendum)
/   PATIENT: Calvin Wagner DOB: [birth year]  REASON FOR VISIT: follow up HISTORY FROM: patient  HISTORY OF PRESENT ILLNESS: [name] is a 50 year old male with a history of migraine headaches. He returns today for follow-up. At the last visit he was started on Topamax but was unable to tolerate this medication. He states that it caused stomach pain that he describes as a burning sensation. He states that he's been using Imitrex and that has been beneficial. He states that his headaches are normally located in the temporal region bilaterally or above the right eye. He denies phonophobia but firm photophobia. Confirms nausea but denies vomiting. Occasionally he'll have blurred vision with severe headaches. He knows that the weather is a trigger for his headaches. He states for the last 3-4 weeks he has not had a headache at all. He returns today for an evaluation.  HISTORY 10/10/15 Calvin Wagner): TAYTE Wagner is a 50 y.o. male here as a referral from Dr. Orland Mustard for migraines. Past medical history of hypertension and migraines. Headaches started since high school. No inciting events. As he got older they got worse. Nausea is getting worse and the dizziness. Nausea can be severe, he vomits. Headaches are on the right side, throbbing or sharp. They can last continuously for over a week. Comes and goes. Caffeine makes it worse, trigger. He can go months without a headache or he can have 2 a month which each last a week. So some months he can have up to 14 days of migraines. He has light and sound sensitivity, he has to put cotton in his ears. He needs to sit still with the headaches, if he walks it is worse. Sleeping makes it better. Sumatriptan doesn't really work. He has never tried any daily medication. Chocolate is a trigger. He snores. He wake up with headache. He wakes up with a dry mouth. He is really tired during the day, he could lay down at any time and fall asleep. He goes to bed at 10pm and wakes up  at 5am. He falls asleep easily but still very tired. No apneic events as far as he knows. He has not gained weight but he is obese at BMI of 30. Mother with migraines. He has had an aura once.   Reviewed notes, labs and imaging from outside physicians, which showed: Reviewed notes from [facility] in audiology. He has a past medical history of migraine, hypertension, tinnitus and nausea. He had a normal audiogram with slight drop-off at the highest frequencies but still within normal limits. His problems may be migraine related.     REVIEW OF SYSTEMS: Out of a complete 14 system review of symptoms, the patient complains only of the following symptoms, and all other reviewed systems are negative.  Fatigue, flushing, nausea  ALLERGIES: No Known Allergies  HOME MEDICATIONS: Outpatient Prescriptions Prior to Visit  Medication Sig Dispense Refill  . aspirin EC 81 MG tablet Take 81 mg by mouth daily.    . Multiple Vitamin (MULTIVITAMIN WITH MINERALS) TABS tablet Take 1 tablet by mouth daily.    . SUMAtriptan (IMITREX) 50 MG tablet TAKE 1 TABLET BY MOUTH AS NEEDED AT START OF MIGRAINE. MAY REPEAT 1 DOSE IN 1HR IF NEEDED  2  . lisinopril-hydrochlorothiazide (PRINZIDE,ZESTORETIC) 10-12.5 MG per tablet Take 1 tablet by mouth daily.    Marland Kitchen topiramate (TOPAMAX) 50 MG tablet Take 1 tablet (50 mg total) by mouth at bedtime. 30 tablet 6  No facility-administered medications prior to visit.    PAST MEDICAL HISTORY: Past Medical History  Diagnosis Date  . Migraine 02/25/2012  . GERD 11/08/2009    Qualifier: Diagnosis of  By: Ronnald Ramp MD, Arvid Right.   . HYPERTENSION 11/08/2009    Qualifier: Diagnosis of  By: Ronnald Ramp MD, Arvid Right.     PAST SURGICAL HISTORY: Past Surgical History  Procedure Laterality Date  . Inguinal hernia repair    . Total knee arthroplasty      FAMILY HISTORY: Family History  Problem Relation Age of Onset  . Lung cancer      family  history  . CAD      strong family history male and male <50  . Mental retardation    . Alcohol abuse Neg Hx   . Diabetes Neg Hx   . Early death Neg Hx   . Hyperlipidemia Neg Hx   . Hypertension Neg Hx   . Kidney disease Neg Hx   . Stroke Neg Hx   . Migraines Neg Hx   . Heart attack Brother 106  . Heart disease Mother 61    SOCIAL HISTORY: Social History   Social History  . Marital Status: Married    Spouse Name: Baker Janus  . Number of Children: 2  . Years of Education: N/A   Occupational History  . [employer]   Social History Main Topics  . Smoking status: Never Smoker   . Smokeless tobacco: Never Used  . Alcohol Use: 6.6 - 7.8 oz/week    5 Glasses of wine, 6-8 Cans of beer per week     Comment: socially  . Drug Use: No  . Sexual Activity: Not Currently   Other Topics Concern  . Not on file   Social History Narrative   Lives at home wife and 2 kids   Caffeine use: none        PHYSICAL EXAM  Filed Vitals:   01/06/16 1606  BP: 122/85  Pulse: 85  Height: 6' (1.829 m)  Weight: 230 lb (104.327 kg)  SpO2: 98%   Body mass index is 31.19 kg/(m^2).  Generalized: Well developed, in no acute distress   Neurological examination  Mentation: Alert oriented to time, place, history taking. Follows all commands speech and language fluent Cranial nerve II-XII: Pupils were equal round reactive to light. Extraocular movements were full, visual field were full on confrontational test. Facial sensation and strength were normal. Uvula tongue midline. Head turning and shoulder shrug  were normal and symmetric. Motor: The motor testing reveals 5 over 5 strength of all 4 extremities. Good symmetric motor tone is noted throughout.  Sensory: Sensory testing is intact to soft touch on all 4 extremities. No evidence of extinction is noted.  Coordination: Cerebellar testing reveals good finger-nose-finger and heel-to-shin bilaterally.  Gait and station: Gait is  normal. Tandem gait is normal. Romberg is negative. No drift is seen.  Reflexes: Deep tendon reflexes are symmetric and normal bilaterally.   DIAGNOSTIC DATA (LABS, IMAGING, TESTING) - I reviewed patient records, labs, notes, testing and imaging myself where available.      Component Value Date/Time   NA 139 10/10/2015 0915   NA 138 07/22/2014 0654   K 5.3* 10/10/2015 0915   CL 97 10/10/2015 0915   CO2 23 10/10/2015 0915   GLUCOSE 89 10/10/2015 0915   GLUCOSE 111* 07/22/2014 0654   BUN 14 10/10/2015 0915   BUN 14 07/22/2014 0654   CREATININE 1.06 10/10/2015  0915   CALCIUM 10.1 10/10/2015 0915   PROT 7.3 10/10/2015 0915   PROT 6.7 07/22/2014 0654   ALBUMIN 4.5 10/10/2015 0915   ALBUMIN 3.4* 07/22/2014 0654   AST 25 10/10/2015 0915   ALT 39 10/10/2015 0915   ALKPHOS 63 10/10/2015 0915   BILITOT 0.4 10/10/2015 0915   BILITOT 0.5 07/22/2014 0654   GFRNONAA 83 10/10/2015 0915   GFRAA 95 10/10/2015 0915       ASSESSMENT AND PLAN 49 y.o. year old male  has a past medical history of Migraine (02/25/2012); GERD (11/08/2009); and HYPERTENSION (11/08/2009). here with:  1. Migraine headaches  At this time the patient does not want to start any new medication. He states that if his headache frequency increases he will consider a preventative medication. He states that Imitrex is working well for him. I advised that he can take naproxen along with Imitrex. He could start with 200 mg of naproxen and increased to 400 mg if that is not beneficial.. Patient verbalized understanding. He denies any new neurological symptoms. He returns today for evaluation.     Ward Givens, MSN, NP-C 01/06/2016, 4:32 PM [facility] [address] [place], [place] [address] [phone]   Personally participated in and made any corrections needed to history, physical, neuro exam,assessment and plan as stated above.  I have personally evaluated lab data, reviewed imaging  studies and agree with radiology interpretations.    Sarina Ill, MD [facility]

## 2016-01-06 NOTE — Patient Instructions (Addendum)
Continue Imitrex for acute headache therapy May Take naproxen (up to 400 mg) with imitrex  If your symptoms worsen or you develop new symptoms please let us know.

## 2016-01-17 ENCOUNTER — Other Ambulatory Visit: Payer: Self-pay | Admitting: Family Medicine

## 2016-01-17 DIAGNOSIS — R1084 Generalized abdominal pain: Secondary | ICD-10-CM

## 2016-01-17 DIAGNOSIS — R222 Localized swelling, mass and lump, trunk: Secondary | ICD-10-CM

## 2016-01-23 ENCOUNTER — Ambulatory Visit
Admission: RE | Admit: 2016-01-23 | Discharge: 2016-01-23 | Disposition: A | Discharge status: Home / Self Care | Payer: 59 | Source: Ambulatory Visit | Attending: Family Medicine | Admitting: Family Medicine

## 2016-01-23 DIAGNOSIS — R1084 Generalized abdominal pain: Secondary | ICD-10-CM

## 2016-01-23 DIAGNOSIS — R222 Localized swelling, mass and lump, trunk: Secondary | ICD-10-CM

## 2016-01-23 MED ORDER — IOPAMIDOL (ISOVUE-300) INJECTION 61%
100.0000 mL | Freq: Once | INTRAVENOUS | Status: AC | PRN
  Administered 2016-01-23: 100 mL via INTRAVENOUS

## 2016-04-08 ENCOUNTER — Ambulatory Visit: Payer: 59 | Admitting: Neurology

## 2016-04-22 ENCOUNTER — Other Ambulatory Visit: Payer: Self-pay | Admitting: Family Medicine

## 2016-04-22 DIAGNOSIS — R911 Solitary pulmonary nodule: Secondary | ICD-10-CM

## 2016-06-08 ENCOUNTER — Ambulatory Visit
Admission: RE | Admit: 2016-06-08 | Discharge: 2016-06-08 | Disposition: A | Discharge status: Home / Self Care | Payer: 59 | Source: Ambulatory Visit | Attending: Family Medicine | Admitting: Family Medicine

## 2016-06-08 DIAGNOSIS — R911 Solitary pulmonary nodule: Secondary | ICD-10-CM

## 2016-06-08 MED ORDER — IOPAMIDOL (ISOVUE-300) INJECTION 61%
75.0000 mL | Freq: Once | INTRAVENOUS | Status: AC | PRN
  Administered 2016-06-08: 75 mL via INTRAVENOUS

## 2016-07-13 ENCOUNTER — Other Ambulatory Visit (HOSPITAL_COMMUNITY): Payer: Self-pay | Admitting: Family Medicine

## 2016-07-13 DIAGNOSIS — R911 Solitary pulmonary nodule: Secondary | ICD-10-CM

## 2016-07-13 DIAGNOSIS — R590 Localized enlarged lymph nodes: Secondary | ICD-10-CM

## 2016-07-17 ENCOUNTER — Encounter (HOSPITAL_COMMUNITY): Payer: 59

## 2016-08-24 ENCOUNTER — Other Ambulatory Visit: Payer: Self-pay | Admitting: Family Medicine

## 2016-08-24 DIAGNOSIS — R911 Solitary pulmonary nodule: Secondary | ICD-10-CM

## 2016-09-17 ENCOUNTER — Other Ambulatory Visit: Payer: Self-pay | Admitting: Family Medicine

## 2016-09-17 DIAGNOSIS — R05 Cough: Secondary | ICD-10-CM

## 2016-09-17 DIAGNOSIS — R911 Solitary pulmonary nodule: Secondary | ICD-10-CM

## 2016-09-17 DIAGNOSIS — R059 Cough, unspecified: Secondary | ICD-10-CM

## 2016-09-18 ENCOUNTER — Ambulatory Visit
Admission: RE | Admit: 2016-09-18 | Discharge: 2016-09-18 | Disposition: A | Discharge status: Home / Self Care | Payer: 59 | Source: Ambulatory Visit | Attending: Family Medicine | Admitting: Family Medicine

## 2016-09-18 DIAGNOSIS — R059 Cough, unspecified: Secondary | ICD-10-CM

## 2016-09-18 DIAGNOSIS — R05 Cough: Secondary | ICD-10-CM

## 2016-09-18 DIAGNOSIS — R911 Solitary pulmonary nodule: Secondary | ICD-10-CM

## 2016-09-18 MED ORDER — IOPAMIDOL (ISOVUE-300) INJECTION 61%: 75.0000 mL | Freq: Once | INTRAVENOUS | Status: DC | PRN

## 2016-10-16 ENCOUNTER — Emergency Department (HOSPITAL_COMMUNITY)
Admission: EM | Admit: 2016-10-16 | Discharge: 2016-10-16 | Disposition: A | Discharge status: Home / Self Care | Payer: 59 | Attending: Emergency Medicine | Admitting: Emergency Medicine

## 2016-10-16 ENCOUNTER — Encounter (HOSPITAL_COMMUNITY): Payer: Self-pay | Admitting: Emergency Medicine

## 2016-10-16 ENCOUNTER — Emergency Department (HOSPITAL_COMMUNITY): Payer: 59

## 2016-10-16 DIAGNOSIS — Z79899 Other long term (current) drug therapy: Secondary | ICD-10-CM | POA: Diagnosis not present

## 2016-10-16 DIAGNOSIS — J189 Pneumonia, unspecified organism: Secondary | ICD-10-CM | POA: Diagnosis not present

## 2016-10-16 DIAGNOSIS — R040 Epistaxis: Secondary | ICD-10-CM | POA: Insufficient documentation

## 2016-10-16 DIAGNOSIS — Z96659 Presence of unspecified artificial knee joint: Secondary | ICD-10-CM | POA: Insufficient documentation

## 2016-10-16 DIAGNOSIS — I10 Essential (primary) hypertension: Secondary | ICD-10-CM | POA: Insufficient documentation

## 2016-10-16 DIAGNOSIS — J181 Lobar pneumonia, unspecified organism: Secondary | ICD-10-CM | POA: Diagnosis not present

## 2016-10-16 DIAGNOSIS — R05 Cough: Secondary | ICD-10-CM | POA: Diagnosis not present

## 2016-10-16 DIAGNOSIS — Z7982 Long term (current) use of aspirin: Secondary | ICD-10-CM | POA: Insufficient documentation

## 2016-10-16 LAB — CBC
HCT: 39.5 % (ref 39.0–52.0)
Hemoglobin: 13.9 g/dL (ref 13.0–17.0)
MCH: 32.4 pg (ref 26.0–34.0)
MCHC: 35.2 g/dL (ref 30.0–36.0)
MCV: 92.1 fL (ref 78.0–100.0)
Platelets: 329 10*3/uL (ref 150–400)
RBC: 4.29 MIL/uL (ref 4.22–5.81)
RDW: 12.9 % (ref 11.5–15.5)
WBC: 13.4 10*3/uL — ABNORMAL HIGH (ref 4.0–10.5)

## 2016-10-16 MED ORDER — DOXYCYCLINE HYCLATE 100 MG PO TABS
100.0000 mg | ORAL_TABLET | Freq: Once | ORAL | Status: AC
  Administered 2016-10-16: 100 mg via ORAL
  Filled 2016-10-16: qty 1

## 2016-10-16 MED ORDER — BENZONATATE 100 MG PO CAPS: 100.0000 mg | ORAL_CAPSULE | Freq: Three times a day (TID) | ORAL | 0 refills | Status: DC | PRN

## 2016-10-16 MED ORDER — DOXYCYCLINE HYCLATE 100 MG PO CAPS: 100.0000 mg | ORAL_CAPSULE | Freq: Two times a day (BID) | ORAL | 0 refills | Status: DC

## 2016-10-16 MED ORDER — OXYMETAZOLINE HCL 0.05 % NA SOLN
1.0000 | Freq: Once | NASAL | Status: AC
  Administered 2016-10-16: 1 via NASAL
  Filled 2016-10-16: qty 15

## 2016-10-16 NOTE — ED Notes (Signed)
Pt states he was recently diagnosed with the flu which is beginning to resolve.

## 2016-10-16 NOTE — ED Provider Notes (Signed)
[facility] DEPT Provider Note   CSN: [id number] Arrival date & time: 10/16/16  0012  By signing my name below, I, Dolores Hoose, attest that this documentation has been prepared under the direction and in the presence of Sherwood Gambler, MD . Electronically Signed: Dolores Hoose, Scribe. 10/16/2016. 1:46 AM.  History   Chief Complaint Chief Complaint  Patient presents with  . Epistaxis   The history is provided by the patient. No language interpreter was used.    HPI Comments:  Calvin Wagner is a 51 y.o. male with pmhx of HTN and GERD who presents to the Emergency Department complaining of sudden-onset constant epistaxis beginning two hours ago. Pt states that he has had some cough, congestion, chest wall tenderness, sneezing and light-headedness this week due to a cold and thinks this may have caused his nosebleed. Fevers earlier in the week but feels like he's improving. He has tried pinching his nose with no relief. Pt usually takes a daily baby aspirin but has not taken any today. He denies any fever.   Past Medical History:  Diagnosis Date  . GERD 11/08/2009   Qualifier: Diagnosis of  By: Ronnald Ramp MD, Arvid Right.   . HYPERTENSION 11/08/2009   Qualifier: Diagnosis of  By: Ronnald Ramp MD, Arvid Right.   . Migraine 02/25/2012    Patient Active Problem List   Diagnosis Date Noted  . Headache 10/10/2015  . Worsening headaches 10/10/2015  . Vision changes 10/10/2015  . Snoring 10/10/2015  . Uncontrolled morning headache 10/10/2015  . 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 Dizziness 10/10/2015  . Nausea with vomiting 10/10/2015  . Chest pain 07/21/2014  . Alcohol dependence ([place]) 07/21/2014  . Abdominal pain 07/21/2014  . Chest pain, atypical 07/21/2014  . Chest pain at rest 07/21/2014  . Other abnormal glucose 10/26/2012  . Essential hypertension 11/08/2009  . GERD 11/08/2009    Past Surgical History:  Procedure Laterality Date  . INGUINAL HERNIA REPAIR    . TOTAL KNEE  ARTHROPLASTY       Home Medications    Prior to Admission medications   Medication Sig Start Date End Date Taking? Authorizing Provider  aspirin EC 81 MG tablet Take 81 mg by mouth daily.    Historical Provider, MD  benzonatate (TESSALON) 100 MG capsule Take 1 capsule (100 mg total) by mouth 3 (three) times daily as needed for cough. 10/16/16   Sherwood Gambler, MD  doxycycline (VIBRAMYCIN) 100 MG capsule Take 1 capsule (100 mg total) by mouth 2 (two) times daily. One po bid x 7 days 10/16/16   Sherwood Gambler, MD  esomeprazole (NEXIUM) 40 MG capsule Take 40 mg by mouth daily. 12/11/15   Historical Provider, MD  lisinopril (PRINIVIL,ZESTRIL) 10 MG tablet Take 10 mg by mouth daily.    Historical Provider, MD  Multiple Vitamin (MULTIVITAMIN WITH MINERALS) TABS tablet Take 1 tablet by mouth daily.    Historical Provider, MD  SUMAtriptan (IMITREX) 50 MG tablet TAKE 1 TABLET BY MOUTH AS NEEDED AT START OF MIGRAINE. MAY REPEAT 1 DOSE IN 1HR IF NEEDED 09/18/15   Historical Provider, MD    Family History Family History  Problem Relation Age of Onset  . Heart disease Mother 61  . Lung cancer      family history  . CAD      strong family history male and male <50  . Mental retardation    . Heart attack Brother 34  . Alcohol abuse Neg Hx   . Diabetes Neg  Hx   . Early death Neg Hx   . Hyperlipidemia Neg Hx   . Hypertension Neg Hx   . Kidney disease Neg Hx   . Stroke Neg Hx   . Migraines Neg Hx     Social History Social History  Substance Use Topics  . Smoking status: Never Smoker  . Smokeless tobacco: Never Used  . Alcohol use 6.6 - 7.8 oz/week    5 Glasses of wine, 6 - 8 Cans of beer per week     Comment: 5 times per week     Allergies   Patient has no known allergies.   Review of Systems Review of Systems  Constitutional: Negative for fever.  HENT: Positive for congestion, nosebleeds and sneezing.   Respiratory: Positive for cough.   Cardiovascular: Positive for chest pain  (Chest wall).  Neurological: Positive for light-headedness.  All other systems reviewed and are negative.    Physical Exam Updated Vital Signs BP 149/99 (BP Location: Right Arm)   Pulse 100   Temp 98.8 F (37.1 C) (Oral)   Resp 16   Ht 6' (1.829 m)   Wt 230 lb (104.3 kg)   SpO2 94%   BMI 31.19 kg/m   Physical Exam  Constitutional: He is oriented to person, place, and time. He appears well-developed and well-nourished. No distress.  HENT:  Head: Normocephalic and atraumatic.  Right Ear: External ear normal.  Left Ear: External ear normal.  Nose: No nose lacerations, sinus tenderness or nasal septal hematoma. Epistaxis is observed.  Dried blood in left nare. When cleared there is no obvious source of bleeding. Mild friability in anterior nare but no obvious bleeding source.   Eyes: Right eye exhibits no discharge. Left eye exhibits no discharge.  Neck: Neck supple.  Cardiovascular: Normal rate, regular rhythm and normal heart sounds.   Pulmonary/Chest: Effort normal. No accessory muscle usage. No tachypnea. No respiratory distress. He has decreased breath sounds in the right lower field and the left lower field.  Abdominal: Soft. There is no tenderness.  Musculoskeletal: He exhibits no edema.  Neurological: He is alert and oriented to person, place, and time.  Skin: Skin is warm and dry. He is not diaphoretic.  Nursing note and vitals reviewed.    ED Treatments / Results  DIAGNOSTIC STUDIES:  Oxygen Saturation is 96% on RA, normal by my interpretation.    COORDINATION OF CARE:  1:52 AM Discussed treatment plan with pt at bedside which includes imaging and potential rhino-rocket and pt agreed to plan.  Labs (all labs ordered are listed, but only abnormal results are displayed) Labs Reviewed  CBC - Abnormal; Notable for the following:       Result Value   WBC 13.4 (*)    All other components within normal limits    EKG  EKG Interpretation None        Radiology Dg Chest 2 View  Result Date: 10/16/2016 CLINICAL DATA:  Nose bleed, cough congestion and fever EXAM: CHEST  2 VIEW COMPARISON:  CT 09/18/2016, radiograph 07/21/2014 FINDINGS: The right lung is clear. There is dense opacity within the lingula and left lower lobe. No effusion. Heart size normal. No pneumothorax per IMPRESSION: Lingular and left lower lobe infiltrates Electronically Signed   By: Donavan Foil M.D.   On: 10/16/2016 02:48    Procedures Procedures (including critical care time)  Medications Ordered in ED Medications  oxymetazoline (AFRIN) 0.05 % nasal spray 1 spray (1 spray Each Nare Given 10/16/16 0030)  doxycycline (VIBRA-TABS) tablet 100 mg (100 mg Oral Given 10/16/16 0350)     Initial Impression / Assessment and Plan / ED Course  I have reviewed the triage vital signs and the nursing notes.  Pertinent labs & imaging results that were available during my care of the patient were reviewed by me and considered in my medical decision making (see chart for details).  Clinical Course     After Afrin and direct pressure, nosebleed has completely stopped. Remained without bleeding while being observed in the ED. Hemoglobin normal. Initially tachycardic but this has resolved without any acute intervention and was probably related to the acute bleeding and anxiety caused by this. Does not appear ill. No hypoxia, increased work of breathing. Suspicion for systemic infection low. X-ray does show a pneumonia, he will be given oral antibiotics. Given no continued bleeding, follow-up with PCP. No indication for acute intervention at this time. Likely the bleeding is from the congestion and nasal irritation. Strict return precautions.  Final Clinical Impressions(s) / ED Diagnoses   Final diagnoses:  Left-sided epistaxis  Community acquired pneumonia of left lower lobe of lung ([place])    New Prescriptions Discharge Medication List as of 10/16/2016  3:32 AM    START taking  these medications   Details  doxycycline (VIBRAMYCIN) 100 MG capsule Take 1 capsule (100 mg total) by mouth 2 (two) times daily. One po bid x 7 days, Starting Fri 10/16/2016, Print       I personally performed the services described in this documentation, which was scribed in my presence. The recorded information has been reviewed and is accurate.    Sherwood Gambler, MD 10/16/16 [phone]

## 2016-10-16 NOTE — ED Notes (Signed)
Patient returned from X-ray 

## 2016-10-16 NOTE — ED Triage Notes (Signed)
Pt c/o nosebleed that began 30 minutes ago; has had cough, congestion, and fever for past week; nosebleed started when pt began coughing while laying down; bleeding appears to be controlled, however, when pt tilts head back, he has wet-sounding cough that is not present when coughing with head held normally

## 2016-10-16 NOTE — ED Notes (Signed)
Patient transported to X-ray 

## 2016-10-20 ENCOUNTER — Encounter: Payer: Self-pay | Admitting: Internal Medicine

## 2016-10-20 ENCOUNTER — Ambulatory Visit (INDEPENDENT_AMBULATORY_CARE_PROVIDER_SITE_OTHER): Payer: 59 | Admitting: Internal Medicine

## 2016-10-20 ENCOUNTER — Other Ambulatory Visit (INDEPENDENT_AMBULATORY_CARE_PROVIDER_SITE_OTHER): Payer: 59

## 2016-10-20 VITALS — BP 158/98 | HR 129 | Ht 73.0 in | Wt 224.0 lb

## 2016-10-20 DIAGNOSIS — R05 Cough: Secondary | ICD-10-CM

## 2016-10-20 DIAGNOSIS — J449 Chronic obstructive pulmonary disease, unspecified: Secondary | ICD-10-CM | POA: Diagnosis not present

## 2016-10-20 DIAGNOSIS — R918 Other nonspecific abnormal finding of lung field: Secondary | ICD-10-CM | POA: Diagnosis not present

## 2016-10-20 DIAGNOSIS — R059 Cough, unspecified: Secondary | ICD-10-CM

## 2016-10-20 LAB — CBC WITH DIFFERENTIAL/PLATELET
Basophils Absolute: 0 10*3/uL (ref 0.0–0.1)
Basophils Relative: 0.4 % (ref 0.0–3.0)
Eosinophils Absolute: 0.2 10*3/uL (ref 0.0–0.7)
Eosinophils Relative: 2.5 % (ref 0.0–5.0)
HCT: 41.9 % (ref 39.0–52.0)
Hemoglobin: 14.6 g/dL (ref 13.0–17.0)
Lymphocytes Relative: 10.2 % — ABNORMAL LOW (ref 12.0–46.0)
Lymphs Abs: 0.9 10*3/uL (ref 0.7–4.0)
MCHC: 34.7 g/dL (ref 30.0–36.0)
MCV: 93.2 fl (ref 78.0–100.0)
Monocytes Absolute: 0.7 10*3/uL (ref 0.1–1.0)
Monocytes Relative: 8.1 % (ref 3.0–12.0)
Neutro Abs: 7.1 10*3/uL (ref 1.4–7.7)
Neutrophils Relative %: 78.8 % — ABNORMAL HIGH (ref 43.0–77.0)
Platelets: 718 10*3/uL — ABNORMAL HIGH (ref 150.0–400.0)
RBC: 4.49 Mil/uL (ref 4.22–5.81)
RDW: 13.1 % (ref 11.5–15.5)
WBC: 9.1 10*3/uL (ref 4.0–10.5)

## 2016-10-20 LAB — SEDIMENTATION RATE: Sed Rate: 41 mm/hr — ABNORMAL HIGH (ref 0–15)

## 2016-10-20 MED ORDER — PANTOPRAZOLE SODIUM 40 MG PO TBEC: 40.0000 mg | DELAYED_RELEASE_TABLET | Freq: Every day | ORAL | 2 refills | Status: DC

## 2016-10-20 MED ORDER — PREDNISONE 10 MG PO TABS: ORAL_TABLET | ORAL | 0 refills | Status: DC

## 2016-10-20 MED ORDER — BUDESONIDE-FORMOTEROL FUMARATE 80-4.5 MCG/ACT IN AERO: 2.0000 | INHALATION_SPRAY | Freq: Two times a day (BID) | RESPIRATORY_TRACT | 11 refills | Status: DC

## 2016-10-20 MED ORDER — FAMOTIDINE 20 MG PO TABS: ORAL_TABLET | ORAL | 2 refills | Status: DC

## 2016-10-20 MED ORDER — ACETAMINOPHEN-CODEINE #3 300-30 MG PO TABS
ORAL_TABLET | ORAL | 0 refills | Status: DC
Start: 2016-10-20 — End: 2016-11-11

## 2016-10-20 MED ORDER — BUDESONIDE-FORMOTEROL FUMARATE 80-4.5 MCG/ACT IN AERO: 2.0000 | INHALATION_SPRAY | Freq: Two times a day (BID) | RESPIRATORY_TRACT | 0 refills | Status: DC

## 2016-10-20 NOTE — Progress Notes (Signed)
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 Pulmonary Consult    referred by Dr. Orland Wagner, CT showed 7x10 left lower lung nodule, dry annoying cough for more than 3 months, just diagnosed with pneumonia[facility]  chronically ill then acute onset fever Nov 11 2015 worse cough/ rattling congestion but no mucus but not abx until 10/17/15 rx doxy by ER  Fever resolved but overall much better off lisinopril x 3 weeks. Short of breath with more than adls  Difficult sleeping due to cough/ no better on tessalon   No obvious day to day or daytime variability or assoc excess/ purulent sputum or mucus plugs or ongoing hemoptysis or cp or chest tightness, subjective wheeze or overt sinus or hb symptoms. No unusual exp hx or h/o childhood pna/ asthma or knowledge of premature birth.    Also denies any obvious fluctuation of symptoms with weather or environmental changes or other aggravating or alleviating factors except as outlined above   Current Medications, Allergies, Complete Past Medical History, Past Surgical History, Family History, and Social History were reviewed in [employer] record.  ROS  The following are not active complaints unless bolded sore throat, dysphagia, dental problems, itching, sneezing,  nasal congestion or excess/ purulent secretions, ear ache,   fever, chills, sweats, unintended wt loss, classically pleuritic or exertional cp,   orthopnea pnd or leg swelling, presyncope, palpitations, abdominal pain, anorexia, nausea, vomiting, diarrhea  or change in bowel or bladder habits, change in stools or urine, dysuria,hematuria,  rash, arthralgias, visual complaints, headache, numbness, weakness or ataxia or problems with walking or coordination,  change in mood/affect or memory.          Review of Systems     Objective:   Physical Exam    amb wm nad  Wt Readings from Last 3 Encounters:  10/20/16 224 lb (101.6 kg)  10/16/16 230 lb (104.3 kg)  01/06/16 230 lb (104.3 kg)    Vital signs reviewed  - Note on arrival 02 sats  95% on RA     HEENT: nl dentition, turbinates, and oropharynx. Nl external ear canals without cough reflex   NECK :  without JVD/Nodes/TM/ nl carotid upstrokes bilaterally   LUNGS: no acc muscle use,  Nl contour chest with L > R  insp and exp coarse rhonchi and cough on insp   CV:  RRR  no s3 or murmur or increase in P2, nad no edema   ABD:  soft and nontender with nl inspiratory excursion in the supine position. No bruits or organomegaly appreciated, bowel sounds nl  MS:  Nl gait/ ext warm without deformities, calf tenderness, cyanosis or clubbing No obvious joint restrictions   SKIN: warm and dry without lesions    NEURO:  alert, approp, nl sensorium with  no motor or cerebellar deficits apparent.     I personally reviewed  images and agree with radiology impression as follows:  CXR:   10/16/16 Lingular and left lower lobe infiltrates    Lab Results  Component Value Date   ESRSEDRATE 41 (H) 10/20/2016     Lab Results  Component Value Date   WBC 9.1 10/20/2016   HGB 14.6 10/20/2016   HCT 41.9 10/20/2016   MCV 93.2 10/20/2016   PLT 718.0 (H) 10/20/2016     Also 10/20/2016 labs:  ACE level       Assessment:

## 2016-10-20 NOTE — Assessment & Plan Note (Signed)
10/20/2016  After extensive coaching HFA effectiveness =    75% > try symbicort 80 2bid

## 2016-10-20 NOTE — Assessment & Plan Note (Signed)
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   These are the same pts (now being labeled as having "irritable larynx syndrome" by some cough centers) who not infrequently have a history of having failed to tolerate ace inhibitors,  dry powder inhalers or biphosphonates or report having atypical/extraesophageal reflux symptoms that don't respond to standard doses of PPI  and are easily confused as having aecopd or asthma flares by even experienced allergists/ pulmonologists (myself included).   rec stay off acei/ max rx for gerd and cyclical cough with tylenol #3

## 2016-10-20 NOTE — Assessment & Plan Note (Addendum)
rx doxy 11/11/15 > fever resolved/clinically improved but still significant airway findings -- 10/20/2016   ESR 41 > pred x 6 days  Note concern re L hilar adenopathy as well so needs f/u in one week with cxr to consider FOB esp if still localized rhonchi on exam or any evidence of post obst pna/atx  as would more strongly indicate direct airway involvement but now doxy will do for presumptive CAP  In meantime rx airways with symbicort (see separate a/p)

## 2016-10-20 NOTE — Patient Instructions (Addendum)
Please remember to go to the lab  department downstairs for your tests - we will call you with the results when they are available.    Bystolic 10 mg  daily to add to your amlodipine  Prednisone 10 mg take  4 each am x 2 days,   2 each am x 2 days,  1 each am x 2 days and stop    Pantoprazole (protonix) 40 mg   Take  30-60 min before first meal of the day and Pepcid (famotidine)  20 mg one @  bedtime until return to office - this is the best way to tell whether stomach acid is contributing to your problem.   GERD (REFLUX)  is an extremely common cause of respiratory symptoms just like yours , many times with no obvious heartburn at all.    It can be treated with medication, but also with lifestyle changes including elevation of the head of your bed (ideally with 6 inch  bed blocks),  Smoking cessation, avoidance of late meals, excessive alcohol, and avoid fatty foods, chocolate, peppermint, colas, red wine, and acidic juices such as orange juice.  NO MINT OR MENTHOL PRODUCTS SO NO COUGH DROPS   USE SUGARLESS CANDY INSTEAD (Jolley ranchers or Stover's or Life Savers) or even ice chips will also do - the key is to swallow to prevent all throat clearing. NO OIL BASED VITAMINS - use powdered substitutes.    Symbicort 80 Take 2 puffs first thing in am and then another 2 puffs about 12 hours later.   Take delsym two tsp every 12 hours and supplement if needed with  Tylenol #3 up to 2 every 4 hours to suppress the urge to cough. Swallowing water or using ice chips/non mint and menthol containing candies (such as lifesavers or sugarless jolly ranchers) are also effective.  You should rest your voice and avoid activities that you know make you cough.  Once you have eliminated the cough for 3 straight days try reducing the tylenol #3  first,  then the delsym as tolerated.    Please schedule a follow up office visit in  1  Week , sooner if needed  - bring all active medications in hand

## 2016-10-21 ENCOUNTER — Institutional Professional Consult (permissible substitution): Payer: 59 | Admitting: Pulmonary Disease

## 2016-10-21 LAB — ANGIOTENSIN CONVERTING ENZYME: Angiotensin-Converting Enzyme: 38 U/L (ref 9–67)

## 2016-10-22 ENCOUNTER — Telehealth: Payer: Self-pay | Admitting: Internal Medicine

## 2016-10-22 NOTE — Telephone Encounter (Signed)
lmtcb X1 

## 2016-10-23 ENCOUNTER — Institutional Professional Consult (permissible substitution): Payer: 59 | Admitting: Internal Medicine

## 2016-10-23 NOTE — Telephone Encounter (Signed)
Attempted to contact pt. Voicemail is full. Will try back.

## 2016-10-26 NOTE — Telephone Encounter (Signed)
ATC, NA, voicemail is full. WCB

## 2016-10-27 ENCOUNTER — Ambulatory Visit (INDEPENDENT_AMBULATORY_CARE_PROVIDER_SITE_OTHER): Payer: 59 | Admitting: Internal Medicine

## 2016-10-27 ENCOUNTER — Ambulatory Visit (INDEPENDENT_AMBULATORY_CARE_PROVIDER_SITE_OTHER)
Admission: RE | Admit: 2016-10-27 | Discharge: 2016-10-27 | Disposition: A | Discharge status: Home / Self Care | Payer: 59 | Source: Ambulatory Visit | Attending: Internal Medicine | Admitting: Internal Medicine

## 2016-10-27 ENCOUNTER — Encounter: Payer: Self-pay | Admitting: Internal Medicine

## 2016-10-27 ENCOUNTER — Ambulatory Visit: Payer: 59 | Admitting: Internal Medicine

## 2016-10-27 VITALS — BP 126/86 | HR 76 | Ht 73.0 in | Wt 228.8 lb

## 2016-10-27 DIAGNOSIS — I10 Essential (primary) hypertension: Secondary | ICD-10-CM | POA: Diagnosis not present

## 2016-10-27 DIAGNOSIS — R918 Other nonspecific abnormal finding of lung field: Secondary | ICD-10-CM | POA: Diagnosis not present

## 2016-10-27 DIAGNOSIS — J449 Chronic obstructive pulmonary disease, unspecified: Secondary | ICD-10-CM | POA: Diagnosis not present

## 2016-10-27 DIAGNOSIS — R05 Cough: Secondary | ICD-10-CM | POA: Diagnosis not present

## 2016-10-27 MED ORDER — BUDESONIDE-FORMOTEROL FUMARATE 80-4.5 MCG/ACT IN AERO: 2.0000 | INHALATION_SPRAY | Freq: Two times a day (BID) | RESPIRATORY_TRACT | 0 refills | Status: DC

## 2016-10-27 MED ORDER — NEBIVOLOL HCL 10 MG PO TABS: 10.0000 mg | ORAL_TABLET | Freq: Every day | ORAL | 0 refills | Status: DC

## 2016-10-27 NOTE — Patient Instructions (Addendum)
Continue symbicort 80 Take 2 puffs first thing in am and then another 2 puffs about 12 hours later.   Work on maintaining perfect  inhaler technique:  relax and gently blow all the way out then take a nice smooth deep breath back in, triggering the inhaler at same time you start breathing in.  Hold for up to 5 seconds if you can. Blow out thru nose. Rinse and gargle with water when done      Continue Pantoprazole (protonix) 40 mg   Take  30-60 min before first meal of the day and Pepcid (famotidine)  20 mg one @  bedtime until return to office - this is the best way to tell whether stomach acid is contributing to your problem.    Blood pressure meds are the same as per med list   GERD (REFLUX)  is an extremely common cause of respiratory symptoms just like yours , many times with no obvious heartburn at all.    It can be treated with medication, but also with lifestyle changes including elevation of the head of your bed (ideally with 6 inch  bed blocks),  Smoking cessation, avoidance of late meals, excessive alcohol, and avoid fatty foods, chocolate, peppermint, colas, red wine, and acidic juices such as orange juice.  NO MINT OR MENTHOL PRODUCTS SO NO COUGH DROPS   USE SUGARLESS CANDY INSTEAD (Jolley ranchers or Stover's or Life Savers) or even ice chips will also do - the key is to swallow to prevent all throat clearing. NO OIL BASED VITAMINS - use powdered substitutes.  Please remember to go to the  x-ray department downstairs for your tests - we will call you with the results when they are available.      Please schedule a follow up office visit in 2  weeks, sooner if needed

## 2016-10-27 NOTE — Telephone Encounter (Signed)
Attempted to contact pt. Voicemail is full. We have attempted to contact pt several times with no success or call back. Per triage protocol, message will be closed.

## 2016-10-27 NOTE — Progress Notes (Signed)
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 Pulmonary Consult    referred by Dr. Orland Mustard, CT showed 7x10 left lower lung nodule, dry annoying cough for more than 3 months, just diagnosed with pneumonia[facility]  chronically ill then acute onset fever Oct 10 2016 worse cough/ rattling congestion but no mucus but not abx until 10/17/15 rx doxy by ER  Fever resolved but overall much better off lisinopril x 3 weeks. Short of breath with more than adls  Difficult sleeping due to cough/ no better on tessalon  rec Please remember to go to the lab  department downstairs for your tests - we will call you with the results when they are available.  Bystolic 10 mg  daily to add to your amlodipine Prednisone 10 mg take  4 each am x 2 days,   2 each am x 2 days,  1 each am x 2 days and stop  Pantoprazole (protonix) 40 mg   Take  30-60 min before first meal of the day and Pepcid (famotidine)  20 mg one @  bedtime until return to office - this is the best way to tell whether stomach acid is contributing to your problem.  GERD  Diet   Symbicort 80 Take 2 puffs first thing in am and then another 2 puffs about 12 hours later.  Take delsym two tsp every 12 hours and supplement if needed with  Tylenol #3 up to 2 every 4 hours to suppress the urge to cough. Swallowing water or using ice chips/non mint and menthol containing  candies (such as lifesavers or sugarless jolly ranchers) are also effective.  You should rest your voice and avoid activities that you know make you cough. Once you have eliminated the cough for 3 straight days try reducing the tylenol #3  first,  then the delsym as tolerated.   Please schedule a follow up office visit in  1  Week , sooner if needed  - bring all active medications in hand    10/27/2016  f/u ov/Veasna Santibanez re: L hilar nodes/ L CAP/ refractory cough improved off acei and on symb 80 2bid /gerd rx  Chief Complaint  Patient presents with  . Follow-up    Coughing less but sputum is yellow.  He is also c/o minimal SOB and has some CP.    L anterolateral cp only with severe coughing fits otherwise all his complaints have improved Mucus was clear p completed doxy, now turing slt yellow in am's moslty but sleeping well now    No obvious day to day or daytime variability or assoc  mucus plugs or ongoing hemoptysis  or chest tightness, subjective wheeze or overt sinus or hb symptoms. No unusual exp hx or h/o childhood pna/ asthma or knowledge of premature birth.    Also denies any  obvious fluctuation of symptoms with weather or environmental changes or other aggravating or alleviating factors except as outlined above   Current Medications, Allergies, Complete Past Medical History, Past Surgical History, Family History, and Social History were reviewed in [employer] record.  ROS  The following are not active complaints unless bolded sore throat, dysphagia, dental problems, itching, sneezing,  nasal congestion or excess/ purulent secretions, ear ache,   fever, chills, sweats, unintended wt loss, classically pleuritic or exertional cp,  orthopnea pnd or leg swelling, presyncope, palpitations, abdominal pain, anorexia, nausea, vomiting, diarrhea  or change in bowel or bladder habits, change in stools or urine, dysuria,hematuria,  rash, arthralgias, visual complaints, headache,  numbness, weakness or ataxia or problems with walking or coordination,  change in mood/affect or memory.                Objective:   Physical Exam    amb wm nad  10/27/2016       228  10/20/16 224 lb (101.6 kg)  10/16/16 230 lb (104.3 kg)  01/06/16 230 lb (104.3 kg)    Vital signs reviewed  - Note on arrival 02 sats  96% on RA     HEENT: nl dentition, turbinates, and oropharynx. Nl external ear canals without cough reflex   NECK :  without JVD/Nodes/TM/ nl carotid upstrokes bilaterally   LUNGS: no acc muscle use,  Nl contour chest with decreased bs on L but no longer any significant rhonchi on L   CV:  RRR  no s3 or murmur or increase in P2, nad no edema   ABD:  soft and nontender with nl inspiratory excursion in the supine position. No bruits or organomegaly appreciated, bowel sounds nl  MS:  Nl gait/ ext warm without deformities, calf tenderness, cyanosis or clubbing No obvious joint restrictions   SKIN: warm and dry without lesions    NEURO:  alert, approp, nl sensorium with  no motor or cerebellar deficits apparent.     CXR PA and Lateral:   10/27/2016 :    I personally reviewed images  : only real change is new L effusion suggested on lateral with atx changes also LLL       Lab Results  Component Value Date   ESRSEDRATE 41 (H) 10/20/2016     Lab Results  Component Value Date   WBC 9.1 10/20/2016   HGB 14.6 10/20/2016   HCT 41.9 10/20/2016   MCV 93.2 10/20/2016   PLT 718.0 (H) 10/20/2016           Assessment:

## 2016-10-28 NOTE — Assessment & Plan Note (Addendum)
rx doxy 10/10/16 > fever resolved completed 7 days  - 10/20/2016   ESR 41 > pred x 6 days  Clinically feels much better but issue remains that he may have a mass vs node in L perihilar area compressing his airways that led to a pneumonia and may now have a small parapneumonic process as well so next step is fob   Discussed in detail all the  indications, usual  risks and alternatives  relative to the benefits with patient who agrees to proceed with bronchoscopy with biopsy.

## 2016-10-28 NOTE — Assessment & Plan Note (Addendum)
Off acei as of mid dec 2017    Adequate control on present rx, reviewed in detail with pt > no change in rx needed  = bystolin 10 mg daily    Although even in retrospect it may not be clear the ACEi contributed to the pt's symptoms,  Pt improved off them and adding them back at this point or in the future would risk confusion in interpretation of non-specific respiratory symptoms to which this patient is prone  ie  Better not to muddy the waters here.

## 2016-10-28 NOTE — Assessment & Plan Note (Addendum)
10/20/2016    > try symbicort 80 2bid  -   10/27/2016  After extensive coaching HFA effectiveness =    90% > continue symbicort 80 2bid   This component is clearly improved vs prior to symbicort

## 2016-10-31 DIAGNOSIS — M25512 Pain in left shoulder: Secondary | ICD-10-CM | POA: Diagnosis not present

## 2016-10-31 DIAGNOSIS — S43005A Unspecified dislocation of left shoulder joint, initial encounter: Secondary | ICD-10-CM | POA: Diagnosis not present

## 2016-11-01 DIAGNOSIS — S46012A Strain of muscle(s) and tendon(s) of the rotator cuff of left shoulder, initial encounter: Secondary | ICD-10-CM | POA: Diagnosis not present

## 2016-11-02 DIAGNOSIS — M67912 Unspecified disorder of synovium and tendon, left shoulder: Secondary | ICD-10-CM | POA: Diagnosis not present

## 2016-11-04 ENCOUNTER — Telehealth: Payer: Self-pay | Admitting: Internal Medicine

## 2016-11-04 DIAGNOSIS — M75122 Complete rotator cuff tear or rupture of left shoulder, not specified as traumatic: Secondary | ICD-10-CM | POA: Diagnosis not present

## 2016-11-04 NOTE — Telephone Encounter (Signed)
MW  Please Advise-  Wife called and stated this pt. [place] and possibly has a torn rotator cuff and he might need surgery, so she stated that his bronch wq on 11/06/16 will need to be rescheduled to another time next week.

## 2016-11-04 NOTE — Telephone Encounter (Signed)
LMOM at [facility] letting them know this needs to be cancelled  I asked that they call back and let us know that they got the msg Will hold in my basket

## 2016-11-04 NOTE — Telephone Encounter (Signed)
Be sure to cancel the bronch

## 2016-11-04 NOTE — Telephone Encounter (Signed)
Pt will have to put off the bronch cause pt will have to have surgery on his shoulder [phone] [place]

## 2016-11-04 NOTE — Telephone Encounter (Signed)
FYI for MW.

## 2016-11-05 ENCOUNTER — Telehealth: Payer: Self-pay | Admitting: Internal Medicine

## 2016-11-05 NOTE — Telephone Encounter (Signed)
Tara from [facility] returned Calvin Wagner's call.  She has cancelled bronch.

## 2016-11-05 NOTE — Telephone Encounter (Signed)
Bronch has been canceled. Pt's wife is aware and states she will call back after his upcoming surgery to reschedule. Nothing further needed.

## 2016-11-06 ENCOUNTER — Ambulatory Visit (HOSPITAL_COMMUNITY): Admission: RE | Admit: 2016-11-06 | Payer: 59 | Source: Ambulatory Visit | Admitting: Internal Medicine

## 2016-11-06 ENCOUNTER — Encounter (HOSPITAL_COMMUNITY): Payer: 59

## 2016-11-06 ENCOUNTER — Encounter (HOSPITAL_COMMUNITY): Admission: RE | Payer: Self-pay | Source: Ambulatory Visit

## 2016-11-06 SURGERY — VIDEO BRONCHOSCOPY WITHOUT FLUORO
Anesthesia: Moderate Sedation | Laterality: Bilateral

## 2016-11-06 NOTE — Telephone Encounter (Signed)
Calvin Wagner [phone] calling back

## 2016-11-06 NOTE — Telephone Encounter (Signed)
ATC. Left message for pt to call back.

## 2016-11-06 NOTE — Telephone Encounter (Signed)
ATC referral coordinator, left message to call back.

## 2016-11-09 ENCOUNTER — Other Ambulatory Visit: Payer: Self-pay | Admitting: Orthopedic Surgery

## 2016-11-09 NOTE — Telephone Encounter (Signed)
ATC - office is closed

## 2016-11-10 ENCOUNTER — Encounter (HOSPITAL_COMMUNITY): Payer: Self-pay | Admitting: *Deleted

## 2016-11-10 NOTE — Telephone Encounter (Signed)
We could only do it if he had to stay additional days and I doubt his insurance would cover that since it's an outpt procedure and costs a lot more as an inpt procedure

## 2016-11-10 NOTE — Telephone Encounter (Signed)
Pt wife returning call again.Calvin Wagner

## 2016-11-10 NOTE — Telephone Encounter (Signed)
LM for pt wife x 1

## 2016-11-10 NOTE — Telephone Encounter (Signed)
Wife is returning phone call.

## 2016-11-10 NOTE — Telephone Encounter (Signed)
lmtcb x1 for pt's wife. 

## 2016-11-10 NOTE — Telephone Encounter (Signed)
lmomtcb x1 

## 2016-11-10 NOTE — Telephone Encounter (Signed)
Spoke with the pt's spouse and notified of recs per MW  She verbalized understanding and nothing further needed

## 2016-11-10 NOTE — Telephone Encounter (Signed)
Pt wife calling wanting to resched biop for this thurs since pt will already be @ hosp for surgery, she can be reached @ [phone] please advise

## 2016-11-10 NOTE — Telephone Encounter (Signed)
Spoke with pt's wife, who states pt is scheduled for rotator cuff surgery 11/12/16. Dr Tamera Punt mention to pt's wife and pt that it would be great if Dr. Melvyn Novas could do the BX at the same time of the rotator cuff surgery on Thursday at [facility] hospital.   MW please advise. Thanks.

## 2016-11-11 ENCOUNTER — Encounter (HOSPITAL_COMMUNITY): Payer: Self-pay | Admitting: *Deleted

## 2016-11-11 NOTE — Progress Notes (Signed)
Dr. Royce Macadamia, Anesthesia, reviewed pt recent chest x ray 10/28/16 and ordered a chest x ray for DOS.

## 2016-11-11 NOTE — Progress Notes (Signed)
Pt denies SOB, chest pain, and being under the care of a cardiologist. Pt denies having a cardiac cath. Pt under the care of Dr. Melvyn Novas, Pulmonology. Pt denies having recent labs. Pt made aware to stop taking Aspirin, vitamins, fish oil and herbal medications. Do not take any NSAIDs ie: Ibuprofen, Advil, Naproxen, BC and Goody Powder or any medication containing Aspirin. Pt verbalized understanding of all pre-op instructions.

## 2016-11-12 ENCOUNTER — Ambulatory Visit (HOSPITAL_COMMUNITY): Payer: 59

## 2016-11-12 ENCOUNTER — Encounter (HOSPITAL_COMMUNITY)
Admission: RE | Disposition: A | Discharge status: Home / Self Care | Payer: Self-pay | Source: Ambulatory Visit | Attending: Orthopedic Surgery

## 2016-11-12 ENCOUNTER — Ambulatory Visit (HOSPITAL_COMMUNITY): Payer: 59 | Admitting: Anesthesiology

## 2016-11-12 ENCOUNTER — Ambulatory Visit (HOSPITAL_COMMUNITY)
Admission: RE | Admit: 2016-11-12 | Discharge: 2016-11-12 | Disposition: A | Discharge status: Home / Self Care | Payer: 59 | Source: Ambulatory Visit | Attending: Orthopedic Surgery | Admitting: Orthopedic Surgery

## 2016-11-12 ENCOUNTER — Encounter (HOSPITAL_COMMUNITY): Payer: Self-pay | Admitting: Surgery

## 2016-11-12 DIAGNOSIS — W19XXXA Unspecified fall, initial encounter: Secondary | ICD-10-CM | POA: Diagnosis not present

## 2016-11-12 DIAGNOSIS — S46012A Strain of muscle(s) and tendon(s) of the rotator cuff of left shoulder, initial encounter: Secondary | ICD-10-CM | POA: Diagnosis not present

## 2016-11-12 DIAGNOSIS — Z79899 Other long term (current) drug therapy: Secondary | ICD-10-CM | POA: Insufficient documentation

## 2016-11-12 DIAGNOSIS — I10 Essential (primary) hypertension: Secondary | ICD-10-CM | POA: Insufficient documentation

## 2016-11-12 DIAGNOSIS — G8918 Other acute postprocedural pain: Secondary | ICD-10-CM | POA: Diagnosis not present

## 2016-11-12 DIAGNOSIS — Z01818 Encounter for other preprocedural examination: Secondary | ICD-10-CM

## 2016-11-12 DIAGNOSIS — Z7982 Long term (current) use of aspirin: Secondary | ICD-10-CM | POA: Insufficient documentation

## 2016-11-12 DIAGNOSIS — K219 Gastro-esophageal reflux disease without esophagitis: Secondary | ICD-10-CM | POA: Diagnosis not present

## 2016-11-12 DIAGNOSIS — M7522 Bicipital tendinitis, left shoulder: Secondary | ICD-10-CM | POA: Diagnosis not present

## 2016-11-12 DIAGNOSIS — S43432A Superior glenoid labrum lesion of left shoulder, initial encounter: Secondary | ICD-10-CM | POA: Insufficient documentation

## 2016-11-12 DIAGNOSIS — G43909 Migraine, unspecified, not intractable, without status migrainosus: Secondary | ICD-10-CM | POA: Insufficient documentation

## 2016-11-12 DIAGNOSIS — J45909 Unspecified asthma, uncomplicated: Secondary | ICD-10-CM | POA: Insufficient documentation

## 2016-11-12 DIAGNOSIS — M75102 Unspecified rotator cuff tear or rupture of left shoulder, not specified as traumatic: Secondary | ICD-10-CM | POA: Diagnosis not present

## 2016-11-12 HISTORY — DX: Other specified postprocedural states: R11.2

## 2016-11-12 HISTORY — DX: Unspecified rotator cuff tear or rupture of left shoulder, not specified as traumatic: M75.102

## 2016-11-12 HISTORY — DX: Other specified postprocedural states: Z98.890

## 2016-11-12 HISTORY — PX: SHOULDER ARTHROSCOPY WITH ROTATOR CUFF REPAIR: SHX5685

## 2016-11-12 HISTORY — DX: Pneumonia, unspecified organism: J18.9

## 2016-11-12 LAB — CBC
HCT: 42.8 % (ref 39.0–52.0)
Hemoglobin: 14.3 g/dL (ref 13.0–17.0)
MCH: 31.4 pg (ref 26.0–34.0)
MCHC: 33.4 g/dL (ref 30.0–36.0)
MCV: 93.9 fL (ref 78.0–100.0)
Platelets: 220 10*3/uL (ref 150–400)
RBC: 4.56 MIL/uL (ref 4.22–5.81)
RDW: 13.4 % (ref 11.5–15.5)
WBC: 4.9 10*3/uL (ref 4.0–10.5)

## 2016-11-12 LAB — BASIC METABOLIC PANEL
Anion gap: 12 (ref 5–15)
BUN: 16 mg/dL (ref 6–20)
CO2: 27 mmol/L (ref 22–32)
Calcium: 9.6 mg/dL (ref 8.9–10.3)
Chloride: 99 mmol/L — ABNORMAL LOW (ref 101–111)
Creatinine, Ser: 1.13 mg/dL (ref 0.61–1.24)
GFR calc Af Amer: >60 mL/min (ref >60)
GFR calc non Af Amer: >60 mL/min (ref >60)
Glucose, Bld: 104 mg/dL — ABNORMAL HIGH (ref 65–99)
Potassium: 4 mmol/L (ref 3.5–5.1)
Sodium: 138 mmol/L (ref 135–145)

## 2016-11-12 SURGERY — ARTHROSCOPY, SHOULDER, WITH ROTATOR CUFF REPAIR
Anesthesia: General | Laterality: Left

## 2016-11-12 MED ORDER — SUCCINYLCHOLINE CHLORIDE 200 MG/10ML IV SOSY
PREFILLED_SYRINGE | INTRAVENOUS | Status: AC
  Filled 2016-11-12: qty 20

## 2016-11-12 MED ORDER — CEFAZOLIN SODIUM-DEXTROSE 2-4 GM/100ML-% IV SOLN
2.0000 g | INTRAVENOUS | Status: AC
  Administered 2016-11-12: 2 g via INTRAVENOUS
  Filled 2016-11-12: qty 100

## 2016-11-12 MED ORDER — PROPOFOL 10 MG/ML IV BOLUS
INTRAVENOUS | Status: DC | PRN
  Administered 2016-11-12: 250 mg via INTRAVENOUS

## 2016-11-12 MED ORDER — SUGAMMADEX SODIUM 200 MG/2ML IV SOLN
INTRAVENOUS | Status: DC | PRN
  Administered 2016-11-12: 300 mg via INTRAVENOUS

## 2016-11-12 MED ORDER — LIDOCAINE HCL (CARDIAC) 20 MG/ML IV SOLN
INTRAVENOUS | Status: DC | PRN
  Administered 2016-11-12: 50 mg via INTRAVENOUS

## 2016-11-12 MED ORDER — PHENYLEPHRINE 40 MCG/ML (10ML) SYRINGE FOR IV PUSH (FOR BLOOD PRESSURE SUPPORT)
PREFILLED_SYRINGE | INTRAVENOUS | Status: AC
  Filled 2016-11-12: qty 10

## 2016-11-12 MED ORDER — MIDAZOLAM HCL 2 MG/2ML IJ SOLN
INTRAMUSCULAR | Status: AC
  Administered 2016-11-12: 1 mg
  Filled 2016-11-12: qty 2

## 2016-11-12 MED ORDER — OXYCODONE-ACETAMINOPHEN 5-325 MG PO TABS: 1.0000 | ORAL_TABLET | ORAL | 0 refills | Status: DC | PRN

## 2016-11-12 MED ORDER — PHENYLEPHRINE HCL 10 MG/ML IJ SOLN
INTRAVENOUS | Status: DC | PRN
  Administered 2016-11-12: 25 ug/min via INTRAVENOUS

## 2016-11-12 MED ORDER — ONDANSETRON HCL 4 MG/2ML IJ SOLN
INTRAMUSCULAR | Status: DC | PRN
  Administered 2016-11-12: 4 mg via INTRAVENOUS

## 2016-11-12 MED ORDER — EPHEDRINE 5 MG/ML INJ
INTRAVENOUS | Status: AC
  Filled 2016-11-12: qty 10

## 2016-11-12 MED ORDER — SODIUM CHLORIDE 0.9 % IR SOLN
Status: DC | PRN
  Administered 2016-11-12 (×5): 3000 mL

## 2016-11-12 MED ORDER — ROCURONIUM BROMIDE 100 MG/10ML IV SOLN
INTRAVENOUS | Status: DC | PRN
  Administered 2016-11-12: 50 mg via INTRAVENOUS

## 2016-11-12 MED ORDER — POVIDONE-IODINE 7.5 % EX SOLN
Freq: Once | CUTANEOUS | Status: DC
  Filled 2016-11-12: qty 118

## 2016-11-12 MED ORDER — PROPOFOL 10 MG/ML IV BOLUS
INTRAVENOUS | Status: AC
Start: 2016-11-12 — End: 2016-11-12
  Filled 2016-11-12: qty 20

## 2016-11-12 MED ORDER — BUPIVACAINE-EPINEPHRINE (PF) 0.5% -1:200000 IJ SOLN
INTRAMUSCULAR | Status: DC | PRN
  Administered 2016-11-12: 30 mL via PERINEURAL

## 2016-11-12 MED ORDER — LACTATED RINGERS IV SOLN
INTRAVENOUS | Status: DC | PRN
  Administered 2016-11-12 (×2): via INTRAVENOUS

## 2016-11-12 MED ORDER — ONDANSETRON HCL 4 MG/2ML IJ SOLN
INTRAMUSCULAR | Status: AC
  Filled 2016-11-12: qty 4

## 2016-11-12 MED ORDER — MIDAZOLAM HCL 5 MG/5ML IJ SOLN
INTRAMUSCULAR | Status: DC | PRN
  Administered 2016-11-12: 2 mg via INTRAVENOUS

## 2016-11-12 MED ORDER — DOCUSATE SODIUM 100 MG PO CAPS: 100.0000 mg | ORAL_CAPSULE | Freq: Three times a day (TID) | ORAL | 0 refills | Status: DC | PRN

## 2016-11-12 MED ORDER — LIDOCAINE 2% (20 MG/ML) 5 ML SYRINGE
INTRAMUSCULAR | Status: AC
  Filled 2016-11-12: qty 10

## 2016-11-12 MED ORDER — GLYCOPYRROLATE 0.2 MG/ML IJ SOLN
INTRAMUSCULAR | Status: DC | PRN
  Administered 2016-11-12: .2 mg via INTRAVENOUS
  Administered 2016-11-12: 0.2 mg via INTRAVENOUS

## 2016-11-12 MED ORDER — FENTANYL CITRATE (PF) 100 MCG/2ML IJ SOLN
INTRAMUSCULAR | Status: AC
  Administered 2016-11-12: 100 ug
  Filled 2016-11-12: qty 2

## 2016-11-12 MED ORDER — FENTANYL CITRATE (PF) 100 MCG/2ML IJ SOLN
INTRAMUSCULAR | Status: AC
  Filled 2016-11-12: qty 2

## 2016-11-12 MED ORDER — LACTATED RINGERS IV SOLN
INTRAVENOUS | Status: DC
  Administered 2016-11-12: 09:00:00 via INTRAVENOUS

## 2016-11-12 MED ORDER — FENTANYL CITRATE (PF) 100 MCG/2ML IJ SOLN
INTRAMUSCULAR | Status: DC | PRN
  Administered 2016-11-12 (×2): 50 ug via INTRAVENOUS

## 2016-11-12 MED ORDER — MIDAZOLAM HCL 2 MG/2ML IJ SOLN
INTRAMUSCULAR | Status: AC
  Filled 2016-11-12: qty 2

## 2016-11-12 SURGICAL SUPPLY — 65 items
BLADE LONG MED 31X9 (MISCELLANEOUS) IMPLANT
BLADE SURG 11 STRL SS (BLADE) ×2 IMPLANT
BUR OVAL 4.0 (BURR) ×3 IMPLANT
CANISTER OMNI JUG 16 LITER (MISCELLANEOUS) ×1 IMPLANT
CANNULA 5.75X7 CRYSTAL CLEAR (CANNULA) ×1 IMPLANT
CANNULA 5.75X71 LONG (CANNULA) ×3 IMPLANT
CANNULA TWIST IN 8.25X7CM (CANNULA) ×1 IMPLANT
CHLORAPREP W/TINT 26ML (MISCELLANEOUS) ×2 IMPLANT
COVER SURGICAL LIGHT HANDLE (MISCELLANEOUS) ×2 IMPLANT
DRAPE INCISE IOBAN 66X45 STRL (DRAPES) ×2 IMPLANT
DRAPE STERI 35X30 U-POUCH (DRAPES) ×2 IMPLANT
DRAPE SURG 17X23 STRL (DRAPES) ×2 IMPLANT
DRAPE U-SHAPE 47X51 STRL (DRAPES) ×2 IMPLANT
DRSG EMULSION OIL 3X3 NADH (GAUZE/BANDAGES/DRESSINGS) ×4 IMPLANT
DRSG PAD ABDOMINAL 8X10 ST (GAUZE/BANDAGES/DRESSINGS) ×3 IMPLANT
ELECT REM PT RETURN 9FT ADLT (ELECTROSURGICAL)
ELECTRODE REM PT RTRN 9FT ADLT (ELECTROSURGICAL) IMPLANT
GAUZE SPONGE 4X4 12PLY STRL (GAUZE/BANDAGES/DRESSINGS) ×2 IMPLANT
GAUZE XEROFORM 1X8 LF (GAUZE/BANDAGES/DRESSINGS) ×2 IMPLANT
GLOVE BIO SURGEON STRL SZ7 (GLOVE) ×2 IMPLANT
GLOVE BIO SURGEON STRL SZ7.5 (GLOVE) ×2 IMPLANT
GLOVE BIOGEL PI IND STRL 6.5 (GLOVE) IMPLANT
GLOVE BIOGEL PI IND STRL 7.0 (GLOVE) ×1 IMPLANT
GLOVE BIOGEL PI IND STRL 8 (GLOVE) ×1 IMPLANT
GLOVE BIOGEL PI INDICATOR 6.5 (GLOVE) ×1
GLOVE BIOGEL PI INDICATOR 7.0 (GLOVE) ×1
GLOVE BIOGEL PI INDICATOR 8 (GLOVE) ×1
GLOVE SURG SS PI 7.0 STRL IVOR (GLOVE) ×2 IMPLANT
GOWN STRL REUS W/ TWL LRG LVL3 (GOWN DISPOSABLE) ×3 IMPLANT
GOWN STRL REUS W/TWL LRG LVL3 (GOWN DISPOSABLE) ×6
KIT BASIN OR (CUSTOM PROCEDURE TRAY) ×2 IMPLANT
KIT ROOM TURNOVER OR (KITS) ×2 IMPLANT
MANIFOLD NEPTUNE II (INSTRUMENTS) ×2 IMPLANT
NDL HYPO 25GX1X1/2 BEV (NEEDLE) ×1 IMPLANT
NDL SCORPION MULTI FIRE (NEEDLE) IMPLANT
NDL SPNL 18GX3.5 QUINCKE PK (NEEDLE) ×1 IMPLANT
NEEDLE HYPO 25GX1X1/2 BEV (NEEDLE) ×2 IMPLANT
NEEDLE SCORPION MULTI FIRE (NEEDLE) ×2 IMPLANT
NEEDLE SPNL 18GX3.5 QUINCKE PK (NEEDLE) ×2 IMPLANT
NS IRRIG 1000ML POUR BTL (IV SOLUTION) ×2 IMPLANT
PACK SHOULDER (CUSTOM PROCEDURE TRAY) ×2 IMPLANT
PAD ABD 8X10 STRL (GAUZE/BANDAGES/DRESSINGS) ×1 IMPLANT
PAD ARMBOARD 7.5X6 YLW CONV (MISCELLANEOUS) ×4 IMPLANT
PROBE BIPOLAR ATHRO 135MM 90D (MISCELLANEOUS) ×1 IMPLANT
RESECTOR FULL RADIUS 4.2MM (BLADE) ×3 IMPLANT
SLING ARM LRG ADULT FOAM STRAP (SOFTGOODS) ×2 IMPLANT
SLING ARM MED ADULT FOAM STRAP (SOFTGOODS) IMPLANT
SPEEDBRIDGE W/PEEK SWIVELOCK (Orthopedic Implant) ×2 IMPLANT
SPONGE GAUZE 4X4 12PLY STER LF (GAUZE/BANDAGES/DRESSINGS) ×2 IMPLANT
SPONGE LAP 4X18 X RAY DECT (DISPOSABLE) IMPLANT
SUPPORT WRAP ARM LG (MISCELLANEOUS) IMPLANT
SUT ETHILON 3 0 PS 1 (SUTURE) ×2 IMPLANT
SUT FIBERWIRE #2 38 REV NDL BL (SUTURE) ×4
SUT FIBERWIRE #2 38 T-5 BLUE (SUTURE)
SUTURE FIBERWR #2 38 T-5 BLUE (SUTURE) IMPLANT
SUTURE FIBERWR#2 38 REV NDL BL (SUTURE) IMPLANT
SYR CONTROL 10ML LL (SYRINGE) ×2 IMPLANT
SYSTEM IMPLNT SPDBRDG PK SWVLK (Orthopedic Implant) IMPLANT
TAPE CLOTH SURG 4X10 WHT LF (GAUZE/BANDAGES/DRESSINGS) ×1 IMPLANT
TOWEL OR 17X24 6PK STRL BLUE (TOWEL DISPOSABLE) ×2 IMPLANT
TOWEL OR 17X26 10 PK STRL BLUE (TOWEL DISPOSABLE) ×2 IMPLANT
TUBE CONNECTING 12X1/4 (SUCTIONS) ×2 IMPLANT
TUBING ARTHROSCOPY IRRIG 16FT (MISCELLANEOUS) ×2 IMPLANT
WAND HAND CNTRL MULTIVAC 90 (MISCELLANEOUS) ×1 IMPLANT
WATER STERILE IRR 1000ML POUR (IV SOLUTION) ×2 IMPLANT

## 2016-11-12 NOTE — Anesthesia Procedure Notes (Addendum)
Anesthesia Regional Block:  Interscalene brachial plexus block  Pre-Anesthetic Checklist: ,, timeout performed, Correct Patient, Correct Site, Correct Laterality, Correct Procedure, Correct Position, site marked, Risks and benefits discussed,  Surgical consent,  Pre-op evaluation,  At surgeon's request and post-op pain management  Laterality: Upper and Left  Prep: Betadine, chloraprep       Needles:  Injection technique: Single-shot  Needle Type: Echogenic Stimulator Needle     Needle Length: 9cm 9 cm Needle Gauge: 22 and 22 G  Needle insertion depth: 5 cm   Additional Needles:  Procedures: ultrasound guided (picture in chart) and nerve stimulator Interscalene brachial plexus block  Nerve Stimulator or Paresthesia:  Response: Twitch elicited, 0.5 mA, 0.3 ms,   Additional Responses:   Narrative:  Start time: 11/12/2016 9:30 AM End time: 11/12/2016 9:45 AM Injection made incrementally with aspirations every 5 mL.  Performed by: Personally  Anesthesiologist: Vonceil Upshur  Additional Notes: Block assessed prior to start of surgery

## 2016-11-12 NOTE — Anesthesia Preprocedure Evaluation (Addendum)
Anesthesia Evaluation  Patient identified by MRN, date of birth, ID band Patient awake    Reviewed: Allergy & Precautions, NPO status , Patient's Chart, lab work & pertinent test results  History of Anesthesia Complications (+) PONV and history of anesthetic complications  Airway Mallampati: II  TM Distance: >3 FB Neck ROM: Full    Dental  (+) Teeth Intact, Dental Advidsory Given   Pulmonary asthma , pneumonia, resolved,    breath sounds clear to auscultation       Cardiovascular hypertension,  Rhythm:Regular Rate:Normal     Neuro/Psych  Headaches, PSYCHIATRIC DISORDERS    GI/Hepatic Neg liver ROS, GERD  ,  Endo/Other  negative endocrine ROS  Renal/GU negative Renal ROS     Musculoskeletal   Abdominal   Peds  Hematology negative hematology ROS (+)   Anesthesia Other Findings   Reproductive/Obstetrics                            Anesthesia Physical Anesthesia Plan  ASA: II  Anesthesia Plan: General   Post-op Pain Management:  Regional for Post-op pain   Induction: Intravenous  Airway Management Planned: Oral ETT  Additional Equipment:   Intra-op Plan:   Post-operative Plan: Extubation in OR  Informed Consent: I have reviewed the patients History and Physical, chart, labs and discussed the procedure including the risks, benefits and alternatives for the proposed anesthesia with the patient or authorized representative who has indicated his/her understanding and acceptance.   Dental advisory given and Dental Advisory Given  Plan Discussed with: Anesthesiologist  Anesthesia Plan Comments:        Anesthesia Quick Evaluation

## 2016-11-12 NOTE — Op Note (Signed)
Procedure(s):   ODAS OZER male 51 y.o. 11/12/2016  Procedure(s) and Anesthesia Type:    *LEFT SHOULDER ARTHROSCOPY WITH ROTATOR CUFF REPAIR      LEFT SHOULDER ARTHROSCOPIC DEBRIDEMENT LABRAL TEAR  Surgeon(s) and Role:    * Tania Ade, MD - Primary     Surgeon: Nita Sells   Assistants: Jeanmarie Hubert PA-C (Danielle was present and scrubbed throughout the procedure and was essential in positioning, assisting with the camera and instrumentation,, and closure)  Anesthesia: General endotracheal anesthesia with preoperative interscalene block given by the attending anesthesiologist    Procedure Detail  Estimated Blood Loss: Min         Drains: none  Blood Given: none         Specimens: none        Complications:  * No complications entered in OR log *         Disposition: PACU - hemodynamically stable.         Condition: stable    Procedure:   INDICATIONS FOR SURGERY: The patient is 51 y.o. male who fell with subsequent left rotator cuff tear significant pain and dysfunction. Indicated for surgical treatment to try and restore function and decrease pain.  OPERATIVE FINDINGS: Examination under anesthesia: No stiffness or instability  DESCRIPTION OF PROCEDURE: The patient was identified in preoperative  holding area where I personally marked the operative site after  verifying site, side, and procedure with the patient. An interscalene block was given by the attending anesthesiologist the holding area.  The patient was taken back to the operating room where general anesthesia was induced without complication and was placed in the beach-chair position with the back  elevated about 60 degrees and all extremities and head and neck carefully padded and  positioned.   The left upper extremity was then prepped and  draped in a standard sterile fashion. The appropriate time-out  procedure was carried out. The patient did receive IV antibiotics   within 30 minutes of incision.   A small posterior portal incision was made and the arthroscope was introduced into the joint. An anterior portal was then established above the subscapularis using needle localization. Small cannula was placed anteriorly. Diagnostic arthroscopy was then carried out .  Subscapularis was noted to have some partial articular sided tearing which was debrided with a shaver back to healthy tendon. No formal repair was felt to be necessary. The biceps tendon looked intact with mild tenosynovitis. There is some type I SLAP tearing superiorly which was debrided with shaver. Anteriorly there was some more extensive tearing of the labrum from about the 3:00 to 6:00 position with some mild detachment and chondral injury. This was debrided extensively with a shaver back to healthy labrum. Otherwise the cartilage was intact. The anterior supraspinatus was intact but posterior supraspinatus extending into the infraspinatus was completely torn and retracted. This was lightly debrided from the undersurface.  The arthroscope was then introduced into the subacromial space a standard lateral portal was established with needle localization. The bursal side of the rotator cuff was carefully examined. The anterior supraspinatus was noted to be intact. The tear started about halfway back on the supraspinatus extending into the entire infraspinatus with moderate retraction. After debriding the bursa and scar tissue the tendon was noted to be mobile and easily reduced back to bone bed of the tuberosity. Therefore a posterior lateral working cannula was placed viewing from an anterolateral portal. Posterior and anterior portals were used for suture passage. A  traction stitch with a #2 FiberWire was placed in the corner of the L-shaped tear allowing it to bring anterior and lateral for anatomic reduction of the tear. The tuberosity was debrided back to bleeding bone to promote healing. One #2 fiber  wire was placed in a side-to-side fashion through the longitudinal portion of the L tear. The remainder of the posterior L tear was then repaired with a 2 x 2 speed bridge technique using 4.75 swivel lock anchors and fiber tapes. There were 2 medial row anchors just off the articular margin right into 2 additional lateral row crossing over bringing the tendon down anatomically over the prepared tuberosity under no undue tension. The side-to-side FiberWire was then tied securing the repair. Traction stitch was removed. The repair was felt to be anatomic. He was noted to have a large anterior os acromiale which was not addressed and did not appear to have signs of impingement.  The arthroscopic equipment was removed from the joint and the portals were closed with 3-0 nylon in an interrupted fashion. Sterile dressings were then applied including Xeroform 4 x 4's ABDs and tape. The patient was then allowed to awaken from general anesthesia, placed in a sling, transferred to the stretcher and taken to the recovery room in stable condition.   POSTOPERATIVE PLAN: The patient will be discharged home today and will followup in one week for suture removal and wound check.  We will follow the standard cuff protocol.

## 2016-11-12 NOTE — Transfer of Care (Signed)
Immediate Anesthesia Transfer of Care Note  Patient: Calvin Wagner  Procedure(s) Performed: Procedure(s) with comments: SHOULDER ARTHROSCOPY WITH ROTATOR CUFF REPAIR AND SUBACROMIAL DECOMPRESSION (Left) - SHOULDER ARTHROSCOPY WITH ROTATOR CUFF REPAIR AND SUBACROMIAL DECOMPRESSION  Patient Location: PACU  Anesthesia Type:General  Level of Consciousness: awake, alert  and oriented  Airway & Oxygen Therapy: Patient Spontanous Breathing and Patient connected to nasal cannula oxygen  Post-op Assessment: Report given to RN, Post -op Vital signs reviewed and stable and Patient moving all extremities X 4  Post vital signs: Reviewed and stable  Last Vitals: There were no vitals filed for this visit.  Last Pain:  Vitals:   11/12/16 0847  PainSc: 6       Patients Stated Pain Goal: 6 (82/70/78 6754)  Complications: No apparent anesthesia complications

## 2016-11-12 NOTE — Anesthesia Postprocedure Evaluation (Addendum)
Anesthesia Post Note  Patient: Calvin Wagner  Procedure(s) Performed: Procedure(s) (LRB): SHOULDER ARTHROSCOPY WITH ROTATOR CUFF REPAIR AND SUBACROMIAL DECOMPRESSION (Left)  Patient location during evaluation: PACU Anesthesia Type: General and Regional Level of consciousness: awake and alert Pain management: pain level controlled Vital Signs Assessment: post-procedure vital signs reviewed and stable Respiratory status: spontaneous breathing, nonlabored ventilation, respiratory function stable and patient connected to nasal cannula oxygen Cardiovascular status: blood pressure returned to baseline and stable Postop Assessment: no signs of nausea or vomiting Anesthetic complications: no       Last Vitals:  Vitals:   11/12/16 1300 11/12/16 1308  BP:  128/68  Pulse: 78 78  Resp: (!) 9 12  Temp:      Last Pain:  Vitals:   11/12/16 1308  PainSc: 0-No pain                 Ceniyah Thorp,JAMES TERRILL

## 2016-11-12 NOTE — H&P (Signed)
Calvin Wagner is an 51 y.o. male.   Chief Complaint: L shoulder pain and weakness HPI: s/p fall with large L Rotator cuff tear.  Past Medical History:  Diagnosis Date  . GERD 11/08/2009   Qualifier: Diagnosis of  By: Ronnald Ramp MD, Arvid Right.   . HYPERTENSION 11/08/2009   Qualifier: Diagnosis of  By: Ronnald Ramp MD, Arvid Right.   . Migraine 02/25/2012  . Pneumonia   . PONV (postoperative nausea and vomiting)   . Rotator cuff tear, left     Past Surgical History:  Procedure Laterality Date  . INGUINAL HERNIA REPAIR    . TONSILLECTOMY    . TOTAL KNEE ARTHROPLASTY      Family History  Problem Relation Age of Onset  . Heart disease Mother 28  . Lung cancer      family history  . CAD      strong family history male and male <50  . Mental retardation    . Heart attack Brother 16  . Alcohol abuse Neg Hx   . Diabetes Neg Hx   . Early death Neg Hx   . Hyperlipidemia Neg Hx   . Hypertension Neg Hx   . Kidney disease Neg Hx   . Stroke Neg Hx   . Migraines Neg Hx    Social History:  reports that he has never smoked. He has never used smokeless tobacco. He reports that he drinks alcohol. He reports that he does not use drugs.  Allergies:  Allergies  Allergen Reactions  . No Known Allergies     Medications Prior to Admission  Medication Sig Dispense Refill  . amLODipine (NORVASC) 5 MG tablet Take 5 mg by mouth daily.    Marland Kitchen aspirin EC 81 MG tablet Take 81 mg by mouth daily.    . budesonide-formoterol (SYMBICORT) 80-4.5 MCG/ACT inhaler Inhale 2 puffs into the lungs 2 (two) times daily. 1 Inhaler 0  . famotidine (PEPCID) 20 MG tablet One at bedtime (Patient taking differently: Take 20 mg by mouth at bedtime. One at bedtime) 30 tablet 2  . Multiple Vitamin (MULTIVITAMIN WITH MINERALS) TABS tablet Take 1 tablet by mouth daily.    . nebivolol (BYSTOLIC) 10 MG tablet Take 1 tablet (10 mg total) by mouth daily. 2 tablet 0  . pantoprazole (PROTONIX) 40 MG tablet Take 1 tablet (40 mg total) by  mouth daily. Take 30-60 min before first meal of the day 30 tablet 2  . traMADol (ULTRAM) 50 MG tablet Take 50 mg by mouth every 6 (six) hours as needed for moderate pain.    . SUMAtriptan (IMITREX) 50 MG tablet TAKE 1 TABLET BY MOUTH AS NEEDED AT START OF MIGRAINE. MAY REPEAT 1 DOSE IN 1HR IF NEEDED  2    Results for orders placed or performed during the hospital encounter of 11/12/16 (from the past 48 hour(s))  CBC     Status: None   Collection Time: 11/12/16  8:37 AM  Result Value Ref Range   WBC 4.9 4.0 - 10.5 K/uL   RBC 4.56 4.22 - 5.81 MIL/uL   Hemoglobin 14.3 13.0 - 17.0 g/dL   HCT 42.8 39.0 - 52.0 %   MCV 93.9 78.0 - 100.0 fL   MCH 31.4 26.0 - 34.0 pg   MCHC 33.4 30.0 - 36.0 g/dL   RDW 13.4 11.5 - 15.5 %   Platelets 220 150 - 400 K/uL  Basic metabolic panel     Status: Abnormal   Collection Time: 11/12/16  8:37 AM  Result Value Ref Range   Sodium 138 135 - 145 mmol/L   Potassium 4.0 3.5 - 5.1 mmol/L   Chloride 99 (L) 101 - 111 mmol/L   CO2 27 22 - 32 mmol/L   Glucose, Bld 104 (H) 65 - 99 mg/dL   BUN 16 6 - 20 mg/dL   Creatinine, Ser 1.13 0.61 - 1.24 mg/dL   Calcium 9.6 8.9 - 10.3 mg/dL   GFR calc non Af Amer >60 >60 mL/min   GFR calc Af Amer >60 >60 mL/min    Comment: (NOTE) The eGFR has been calculated using the CKD EPI equation. This calculation has not been validated in all clinical situations. eGFR's persistently <60 mL/min signify possible Chronic Kidney Disease.    Anion gap 12 5 - 15   Dg Chest 2 View  Result Date: 11/12/2016 CLINICAL DATA:  Preop exam. EXAM: CHEST  2 VIEW COMPARISON:  10/27/2016 FINDINGS: Persisting left lower lobe consolidation with small pleural effusion and volume loss. There was progressive hilar adenopathy with airway narrowing on 09/18/2016 chest CT. PET-CT or bronchoscopy is again recommended. Per the EMR, bronchoscopy is planned but has been delayed. Normal heart size. Mild upper thoracic scoliosis. IMPRESSION: Persisting left lower  lobe atelectasis/consolidation with small effusion, likely postobstructive in this patient with adenopathy and airway narrowing by 09/18/2016 chest CT. PET-CT or bronchoscopy again recommended. Electronically Signed   By: Monte Fantasia M.D.   On: 11/12/2016 09:06    Review of Systems  Respiratory: Positive for cough.   All other systems reviewed and are negative.   There were no vitals taken for this visit. Physical Exam  Constitutional: He is oriented to person, place, and time. He appears well-developed and well-nourished.  HENT:  Head: Atraumatic.  Eyes: EOM are normal.  Cardiovascular: Intact distal pulses.   Respiratory: Effort normal.  Musculoskeletal:  L shoulder pain and weakness with RC testing.  Neurological: He is alert and oriented to person, place, and time.  Skin: Skin is warm and dry.  Psychiatric: He has a normal mood and affect.     Assessment/Plan L traumatic rotator cuff tear Plan arth L RCR Risks / benefits of surgery discussed Consent on chart  NPO for OR Preop antibiotics   Nita Sells, MD 11/12/2016, 9:55 AM

## 2016-11-12 NOTE — Anesthesia Procedure Notes (Signed)
Procedure Name: Intubation Date/Time: 11/12/2016 10:19 AM Performed by: Neldon Newport Pre-anesthesia Checklist: Timeout performed, Emergency Drugs available, Patient identified, Patient being monitored and Suction available Patient Re-evaluated:Patient Re-evaluated prior to inductionOxygen Delivery Method: Circle system utilized Preoxygenation: Pre-oxygenation with 100% oxygen Intubation Type: IV induction Ventilation: Mask ventilation without difficulty Laryngoscope Size: Mac and 4 Grade View: Grade I Tube type: Oral Tube size: 7.5 mm Number of attempts: 1 Placement Confirmation: breath sounds checked- equal and bilateral,  positive ETCO2 and ETT inserted through vocal cords under direct vision Secured at: 23 cm Tube secured with: Tape Dental Injury: Teeth and Oropharynx as per pre-operative assessment

## 2016-11-13 ENCOUNTER — Ambulatory Visit: Payer: 59 | Admitting: Internal Medicine

## 2016-11-16 ENCOUNTER — Encounter (HOSPITAL_COMMUNITY): Payer: Self-pay | Admitting: Orthopedic Surgery

## 2016-11-16 ENCOUNTER — Telehealth: Payer: Self-pay | Admitting: Internal Medicine

## 2016-11-16 MED ORDER — NEBIVOLOL HCL 10 MG PO TABS: 10.0000 mg | ORAL_TABLET | Freq: Every day | ORAL | 2 refills | Status: DC

## 2016-11-16 NOTE — Telephone Encounter (Signed)
Spoke with the pt  He needs rx for Bystolic  This was added on his last ov  Rx sent and nothing further needed

## 2016-11-18 ENCOUNTER — Telehealth: Payer: Self-pay | Admitting: Internal Medicine

## 2016-11-18 MED ORDER — NEBIVOLOL HCL 10 MG PO TABS
10.0000 mg | ORAL_TABLET | Freq: Every day | ORAL | 2 refills | Status: DC
Start: 2016-11-18 — End: 2016-11-27

## 2016-11-18 NOTE — Telephone Encounter (Signed)
Spoke with pt's wife. Pt is needing a refill on Bystolic. Rx has been sent in. Nothing further was needed.

## 2016-11-23 ENCOUNTER — Telehealth: Payer: Self-pay | Admitting: Internal Medicine

## 2016-11-23 DIAGNOSIS — M75112 Incomplete rotator cuff tear or rupture of left shoulder, not specified as traumatic: Secondary | ICD-10-CM | POA: Diagnosis not present

## 2016-11-23 DIAGNOSIS — Z4789 Encounter for other orthopedic aftercare: Secondary | ICD-10-CM | POA: Diagnosis not present

## 2016-11-23 DIAGNOSIS — M25512 Pain in left shoulder: Secondary | ICD-10-CM | POA: Diagnosis not present

## 2016-11-23 NOTE — Telephone Encounter (Signed)
Spoke with pt's wife, who states pt has increased sob, non prod cough & wheezing X45mo pt dx with PNA in 10/2016. Pt has been scheduled for acute visit with TP on 11-24-16 @ 9:15a Nothing further needed.

## 2016-11-24 ENCOUNTER — Other Ambulatory Visit: Payer: 59

## 2016-11-24 ENCOUNTER — Ambulatory Visit (INDEPENDENT_AMBULATORY_CARE_PROVIDER_SITE_OTHER): Payer: 59 | Admitting: Adult Health

## 2016-11-24 ENCOUNTER — Encounter: Payer: Self-pay | Admitting: Adult Health

## 2016-11-24 ENCOUNTER — Ambulatory Visit (INDEPENDENT_AMBULATORY_CARE_PROVIDER_SITE_OTHER)
Admission: RE | Admit: 2016-11-24 | Discharge: 2016-11-24 | Disposition: A | Discharge status: Home / Self Care | Payer: 59 | Source: Ambulatory Visit | Attending: Adult Health | Admitting: Adult Health

## 2016-11-24 VITALS — BP 112/90 | HR 68 | Temp 98.5°F | Ht 73.0 in | Wt 224.4 lb

## 2016-11-24 DIAGNOSIS — R0609 Other forms of dyspnea: Secondary | ICD-10-CM | POA: Diagnosis not present

## 2016-11-24 DIAGNOSIS — R059 Cough, unspecified: Secondary | ICD-10-CM

## 2016-11-24 DIAGNOSIS — R05 Cough: Secondary | ICD-10-CM

## 2016-11-24 DIAGNOSIS — R0602 Shortness of breath: Secondary | ICD-10-CM

## 2016-11-24 DIAGNOSIS — R911 Solitary pulmonary nodule: Secondary | ICD-10-CM

## 2016-11-24 DIAGNOSIS — R7989 Other specified abnormal findings of blood chemistry: Secondary | ICD-10-CM

## 2016-11-24 DIAGNOSIS — R0600 Dyspnea, unspecified: Secondary | ICD-10-CM | POA: Insufficient documentation

## 2016-11-24 LAB — D-DIMER, QUANTITATIVE: D-Dimer, Quant: 2.71 mcg/mL FEU — ABNORMAL HIGH (ref <0.50)

## 2016-11-24 NOTE — Assessment & Plan Note (Signed)
Dyspnea , worse since shoulder surgery ? Etiology  Check D Dimer , if positive will need CTA Chest r/o PE>  Cont on symbicort Proceed with FOB on Thursday .

## 2016-11-24 NOTE — Patient Instructions (Signed)
We will set up for Bronchoscopy on Thursday -they will call with time to arrive.  Labs today .  Continue on Symbicort 2 puffs Twice daily   Follow up Dr. Melvyn Novas  In 1-2 weeks and As needed   Please contact office for sooner follow up if symptoms do not improve or worsen or seek emergency care

## 2016-11-24 NOTE — Progress Notes (Signed)
$'@Patient'O$  ID: Calvin Wagner, male    DOB: 05-Aug-1966, 51 y.o.   MRN: [id number]  Chief Complaint  Patient presents with  . Acute Visit    Cough     Referring provider: [name], MD  HPI: 51 year old male never smoker seen for pulmonary consult on 10/20/2016 for abnormal CT chest and cough for 3 months .   TEST  CT abd /pelvis 01/2016 >9 mm LLL nodule  CT chest 05/2016 >53m nodule LLL , +enlarged left hilar node-2.8cm CT chest 09/2016 > unchanged LLL nodule 7x150m, increased hilar adenopathy   11/24/2016 Acute OV : Lung nodule/adenopathy /cough  Patient presents for an acute office visit. Patient was seen last month for pulmonary consult for persistent lung nodule on CT. Patient had a CT abdomen and pelvis in April 2017 with incidental finding of a 9 mm left lower lobe lung nodule. Patient is a never smoker. A dedicated CT chest was done in August 2017 to monitor this nodule., This showed essentially unchanged 9 mm lung nodule in the left lower lobe. There was an enlarged left hilar node measuring 2.8 cm. A follow-up CT chest in December 2017 showed an unchanged left lower lobe nodule measuring 7 x 10 mm with increased left hilar adenopathy measuring 2.2 x 2.5 cm. Patient was set up for a bronchoscopy on January 26 unfortunately, he had a traumatic fall while shoveling snow. And required left shoulder surgery. This was done on 11/12/2016. Patient remains in a sling. Patient complains that he continues to have shortness of breath, which he feels is worsened since he had surgery. He feels shortness of breath with minimal activity and walking. He continues to have a cough and intermittent wheezing. He remains on Symbicort twice daily. Patient denies any hemoptysis, exertional chest pain, orthopnea, PND, leg swelling, calf pain. CXR today w/ decreased left effusion , persistent LLL infiltrate .   Allergies  Allergen Reactions  . No Known Allergies     Immunization History  Administered  Date(s) Administered  . Influenza Whole 07/12/2009  . Influenza,inj,Quad PF,36+ Mos 07/22/2014, 07/10/2016  . Influenza,inj,quad, With Preservative 08/23/2015  . Td 10/12/2008, 04/24/2010  . Tdap 06/28/2010    Past Medical History:  Diagnosis Date  . GERD 11/08/2009   Qualifier: Diagnosis of  By: JoRonnald RampD, ThArvid Right  . HYPERTENSION 11/08/2009   Qualifier: Diagnosis of  By: JoRonnald RampD, ThArvid Right  . Migraine 02/25/2012  . Pneumonia   . PONV (postoperative nausea and vomiting)   . Rotator cuff tear, left     Tobacco History: History  Smoking Status  . Never Smoker  Smokeless Tobacco  . Never Used   Counseling given: Not Answered   Outpatient Encounter Prescriptions as of 11/24/2016  Medication Sig  . amLODipine (NORVASC) 5 MG tablet Take 5 mg by mouth daily.  . Marland Kitchenspirin EC 81 MG tablet Take 81 mg by mouth daily.  . budesonide-formoterol (SYMBICORT) 80-4.5 MCG/ACT inhaler Inhale 2 puffs into the lungs 2 (two) times daily.  . Marland Kitchenocusate sodium (COLACE) 100 MG capsule Take 1 capsule (100 mg total) by mouth 3 (three) times daily as needed.  . famotidine (PEPCID) 20 MG tablet One at bedtime (Patient taking differently: Take 20 mg by mouth at bedtime. One at bedtime)  . Multiple Vitamin (MULTIVITAMIN WITH MINERALS) TABS tablet Take 1 tablet by mouth daily.  . nebivolol (BYSTOLIC) 10 MG tablet Take 1 tablet (10 mg total) by mouth daily.  . Marland KitchenxyCODONE-acetaminophen (ROXICET) 5-325 MG  tablet Take 1-2 tablets by mouth every 4 (four) hours as needed for severe pain.  . pantoprazole (PROTONIX) 40 MG tablet Take 1 tablet (40 mg total) by mouth daily. Take 30-60 min before first meal of the day  . SUMAtriptan (IMITREX) 50 MG tablet TAKE 1 TABLET BY MOUTH AS NEEDED AT START OF MIGRAINE. MAY REPEAT 1 DOSE IN 1HR IF NEEDED  . traMADol (ULTRAM) 50 MG tablet Take 50 mg by mouth every 6 (six) hours as needed for moderate pain.   No facility-administered encounter medications on file as of 11/24/2016.       Review of Systems  Constitutional:   No  weight loss, night sweats,  Fevers, chills, + fatigue, or  lassitude.  HEENT:   No headaches,  Difficulty swallowing,  Tooth/dental problems, or  Sore throat,                No sneezing, itching, ear ache, nasal congestion, post nasal drip,   CV:  No chest pain,  Orthopnea, PND, swelling in lower extremities, anasarca, dizziness, palpitations, syncope.   GI  No heartburn, indigestion, abdominal pain, nausea, vomiting, diarrhea, change in bowel habits, loss of appetite, bloody stools.   Resp:    No wheezing.  No chest wall deformity  Skin: no rash or lesions.  GU: no dysuria, change in color of urine, no urgency or frequency.  No flank pain, no hematuria   MS:  No joint pain or swelling.  No decreased range of motion.  No back pain.    Physical Exam  BP 112/90 (BP Location: Right Arm, Patient Position: Sitting, Cuff Size: Normal)   Pulse 68   Temp 98.5 F (36.9 C)   Ht '6\' 1"'$  (1.854 m)   Wt 224 lb 6.4 oz (101.8 kg)   SpO2 94%   BMI 29.61 kg/m   GEN: A/Ox3; pleasant , NAD, well nourished    HEENT:  [place]/AT,  EACs-clear, TMs-wnl, NOSE-clear, THROAT-clear, no lesions, no postnasal drip or exudate noted.   NECK:  Supple w/ fair ROM; no JVD; normal carotid impulses w/o bruits; no thyromegaly or nodules palpated; no lymphadenopathy.    RESP  Trace rhonchi w/ pop/squeak on inspiration noted.  no accessory muscle use, no dullness to percussion  CARD:  RRR, no m/r/g, no peripheral edema, pulses intact, no cyanosis or clubbing. No calf tenderness , neg homans sign   GI:   Soft & nt; nml bowel sounds; no organomegaly or masses detected.   Musco: Warm bil, no deformities or joint swelling noted.   Neuro: alert, no focal deficits noted.    Skin: Warm, no lesions or rashes   Lab Results:  CBC    Component Value Date/Time   WBC 4.9 11/12/2016 0837   RBC 4.56 11/12/2016 0837   HGB 14.3 11/12/2016 0837   HCT 42.8 11/12/2016  0837   PLT 220 11/12/2016 0837   MCV 93.9 11/12/2016 0837   MCH 31.4 11/12/2016 0837   MCHC 33.4 11/12/2016 0837   RDW 13.4 11/12/2016 0837   LYMPHSABS 0.9 10/20/2016 1009   MONOABS 0.7 10/20/2016 1009   EOSABS 0.2 10/20/2016 1009   BASOSABS 0.0 10/20/2016 1009    BMET    Component Value Date/Time   NA 138 11/12/2016 0837   NA 139 10/10/2015 0915   K 4.0 11/12/2016 0837   CL 99 (L) 11/12/2016 0837   CO2 27 11/12/2016 0837   GLUCOSE 104 (H) 11/12/2016 0837   BUN 16 11/12/2016 0837  BUN 14 10/10/2015 0915   CREATININE 1.13 11/12/2016 0837   CALCIUM 9.6 11/12/2016 0837   GFRNONAA >60 11/12/2016 0837   GFRAA >60 11/12/2016 0837    BNP No results found for: BNP  ProBNP No results found for: PROBNP  Imaging: Dg Chest 2 View  Result Date: 11/24/2016 CLINICAL DATA:  Cough and shortness of breath. EXAM: CHEST  2 VIEW COMPARISON:  Multiple prior chest x-rays and a chest CT from 09/18/2016 FINDINGS: Persistent left lower lobe process possibly a combination of atelectasis and infiltrate. The pleural effusion however has resolved. The right lung remains clear. The cardiac silhouette, mediastinal and hilar contours stable. The bony structures are intact. IMPRESSION: Resolving left pleural effusion but persistent atelectasis and or infiltrate. Electronically Signed   By: Marijo Sanes M.D.   On: 11/24/2016 10:17   Dg Chest 2 View  Result Date: 11/12/2016 CLINICAL DATA:  Preop exam. EXAM: CHEST  2 VIEW COMPARISON:  10/27/2016 FINDINGS: Persisting left lower lobe consolidation with small pleural effusion and volume loss. There was progressive hilar adenopathy with airway narrowing on 09/18/2016 chest CT. PET-CT or bronchoscopy is again recommended. Per the EMR, bronchoscopy is planned but has been delayed. Normal heart size. Mild upper thoracic scoliosis. IMPRESSION: Persisting left lower lobe atelectasis/consolidation with small effusion, likely postobstructive in this patient with  adenopathy and airway narrowing by 09/18/2016 chest CT. PET-CT or bronchoscopy again recommended. Electronically Signed   By: Monte Fantasia M.D.   On: 11/12/2016 09:06   Dg Chest 2 View  Result Date: 10/28/2016 CLINICAL DATA:  Cough, sob, congestion, mid cp, intermittent x 2 mos, HTN, pt had flu x 2 wks ago. EXAM: CHEST  2 VIEW COMPARISON:  One day prior FINDINGS: Midline trachea. Normal heart size and mediastinal contours. Development of a small left pleural effusion. No pneumothorax. Biapical pleural thickening. Persistent left lower lobe airspace disease. Clear right lung. IMPRESSION: Persistent left lower lobe airspace disease, most consistent with pneumonia. Followup PA and lateral chest X-ray is recommended in 3-4 weeks following trial of antibiotic therapy to ensure resolution and exclude underlying malignancy. Development of a small left pleural effusion. Electronically Signed   By: Abigail Miyamoto M.D.   On: 10/28/2016 08:18   Reviewed independently and agree with findings.   Assessment & Plan:   Lung nodule Persistent lung nodule in LLL with associated adenopathy with airway narrowing  Need to proceed with FOB .  Case discussed with pt and risk/benefits reviewed with pt per Dr. Melvyn Novas    Plan  Patient Instructions  We will set up for Bronchoscopy on Thursday -they will call with time to arrive.  Labs today .  Continue on Symbicort 2 puffs Twice daily   Follow up Dr. Melvyn Novas  In 1-2 weeks and As needed   Please contact office for sooner follow up if symptoms do not improve or worsen or seek emergency care      Dyspnea Dyspnea , worse since shoulder surgery ? Etiology  Check D Dimer , if positive will need CTA Chest r/o PE>  Cont on symbicort Proceed with FOB on Thursday .      Rexene Edison, NP 11/24/2016

## 2016-11-24 NOTE — Addendum Note (Signed)
Addended by: Len Blalock on: 11/24/2016 02:01 PM   Modules accepted: Orders

## 2016-11-24 NOTE — Assessment & Plan Note (Signed)
Persistent lung nodule in LLL with associated adenopathy with airway narrowing  Need to proceed with FOB .  Case discussed with pt and risk/benefits reviewed with pt per Dr. Melvyn Novas    Plan  Patient Instructions  We will set up for Bronchoscopy on Thursday -they will call with time to arrive.  Labs today .  Continue on Symbicort 2 puffs Twice daily   Follow up Dr. Melvyn Novas  In 1-2 weeks and As needed   Please contact office for sooner follow up if symptoms do not improve or worsen or seek emergency care

## 2016-11-24 NOTE — Progress Notes (Signed)
Chart and office note reviewed in detail  > agree with a/p as outlined    

## 2016-11-25 ENCOUNTER — Encounter (HOSPITAL_COMMUNITY): Payer: Self-pay | Admitting: Emergency Medicine

## 2016-11-25 ENCOUNTER — Inpatient Hospital Stay (HOSPITAL_COMMUNITY)
Admission: EM | Admit: 2016-11-25 | Discharge: 2016-11-27 | DRG: 175 | Disposition: A | Discharge status: Home / Self Care | Payer: 59 | Attending: Internal Medicine | Admitting: Internal Medicine

## 2016-11-25 ENCOUNTER — Ambulatory Visit (INDEPENDENT_AMBULATORY_CARE_PROVIDER_SITE_OTHER)
Admission: RE | Admit: 2016-11-25 | Discharge: 2016-11-25 | Disposition: A | Discharge status: Home / Self Care | Payer: 59 | Source: Ambulatory Visit | Attending: Adult Health | Admitting: Adult Health

## 2016-11-25 DIAGNOSIS — I10 Essential (primary) hypertension: Secondary | ICD-10-CM | POA: Diagnosis not present

## 2016-11-25 DIAGNOSIS — J449 Chronic obstructive pulmonary disease, unspecified: Secondary | ICD-10-CM | POA: Diagnosis not present

## 2016-11-25 DIAGNOSIS — R0602 Shortness of breath: Secondary | ICD-10-CM | POA: Diagnosis present

## 2016-11-25 DIAGNOSIS — Z7982 Long term (current) use of aspirin: Secondary | ICD-10-CM | POA: Diagnosis not present

## 2016-11-25 DIAGNOSIS — Z79899 Other long term (current) drug therapy: Secondary | ICD-10-CM

## 2016-11-25 DIAGNOSIS — C801 Malignant (primary) neoplasm, unspecified: Secondary | ICD-10-CM | POA: Diagnosis present

## 2016-11-25 DIAGNOSIS — Z96659 Presence of unspecified artificial knee joint: Secondary | ICD-10-CM | POA: Diagnosis present

## 2016-11-25 DIAGNOSIS — R7989 Other specified abnormal findings of blood chemistry: Secondary | ICD-10-CM

## 2016-11-25 DIAGNOSIS — Z7951 Long term (current) use of inhaled steroids: Secondary | ICD-10-CM

## 2016-11-25 DIAGNOSIS — C3402 Malignant neoplasm of left main bronchus: Secondary | ICD-10-CM | POA: Diagnosis not present

## 2016-11-25 DIAGNOSIS — Z801 Family history of malignant neoplasm of trachea, bronchus and lung: Secondary | ICD-10-CM

## 2016-11-25 DIAGNOSIS — K219 Gastro-esophageal reflux disease without esophagitis: Secondary | ICD-10-CM | POA: Diagnosis not present

## 2016-11-25 DIAGNOSIS — Z8249 Family history of ischemic heart disease and other diseases of the circulatory system: Secondary | ICD-10-CM | POA: Diagnosis not present

## 2016-11-25 DIAGNOSIS — J189 Pneumonia, unspecified organism: Secondary | ICD-10-CM

## 2016-11-25 DIAGNOSIS — M79609 Pain in unspecified limb: Secondary | ICD-10-CM | POA: Diagnosis not present

## 2016-11-25 DIAGNOSIS — J9809 Other diseases of bronchus, not elsewhere classified: Secondary | ICD-10-CM

## 2016-11-25 DIAGNOSIS — G43909 Migraine, unspecified, not intractable, without status migrainosus: Secondary | ICD-10-CM | POA: Diagnosis present

## 2016-11-25 DIAGNOSIS — M7989 Other specified soft tissue disorders: Secondary | ICD-10-CM | POA: Diagnosis not present

## 2016-11-25 DIAGNOSIS — R911 Solitary pulmonary nodule: Secondary | ICD-10-CM

## 2016-11-25 DIAGNOSIS — J4489 Other specified chronic obstructive pulmonary disease: Secondary | ICD-10-CM | POA: Diagnosis present

## 2016-11-25 DIAGNOSIS — R918 Other nonspecific abnormal finding of lung field: Secondary | ICD-10-CM

## 2016-11-25 DIAGNOSIS — I2699 Other pulmonary embolism without acute cor pulmonale: Secondary | ICD-10-CM | POA: Diagnosis not present

## 2016-11-25 DIAGNOSIS — K439 Ventral hernia without obstruction or gangrene: Secondary | ICD-10-CM | POA: Diagnosis not present

## 2016-11-25 LAB — URINALYSIS, ROUTINE W REFLEX MICROSCOPIC
Bilirubin Urine: NEGATIVE
Glucose, UA: NEGATIVE mg/dL
Hgb urine dipstick: NEGATIVE
Ketones, ur: NEGATIVE mg/dL
Leukocytes, UA: NEGATIVE
Nitrite: NEGATIVE
Protein, ur: NEGATIVE mg/dL
Specific Gravity, Urine: 1.033 — ABNORMAL HIGH (ref 1.005–1.030)
pH: 6 (ref 5.0–8.0)

## 2016-11-25 LAB — CBC WITH DIFFERENTIAL/PLATELET
Basophils Absolute: 0 10*3/uL (ref 0.0–0.1)
Basophils Relative: 0 %
Eosinophils Absolute: 0.1 10*3/uL (ref 0.0–0.7)
Eosinophils Relative: 2 %
HCT: 41 % (ref 39.0–52.0)
Hemoglobin: 13.8 g/dL (ref 13.0–17.0)
Lymphocytes Relative: 15 %
Lymphs Abs: 1.2 10*3/uL (ref 0.7–4.0)
MCH: 31.2 pg (ref 26.0–34.0)
MCHC: 33.7 g/dL (ref 30.0–36.0)
MCV: 92.6 fL (ref 78.0–100.0)
Monocytes Absolute: 0.4 10*3/uL (ref 0.1–1.0)
Monocytes Relative: 6 %
Neutro Abs: 6.1 10*3/uL (ref 1.7–7.7)
Neutrophils Relative %: 77 %
Platelets: 338 10*3/uL (ref 150–400)
RBC: 4.43 MIL/uL (ref 4.22–5.81)
RDW: 13 % (ref 11.5–15.5)
WBC: 7.8 10*3/uL (ref 4.0–10.5)

## 2016-11-25 LAB — COMPREHENSIVE METABOLIC PANEL
ALT: 22 U/L (ref 17–63)
AST: 25 U/L (ref 15–41)
Albumin: 4 g/dL (ref 3.5–5.0)
Alkaline Phosphatase: 82 U/L (ref 38–126)
Anion gap: 11 (ref 5–15)
BUN: 12 mg/dL (ref 6–20)
CO2: 26 mmol/L (ref 22–32)
Calcium: 10.2 mg/dL (ref 8.9–10.3)
Chloride: 101 mmol/L (ref 101–111)
Creatinine, Ser: 1.05 mg/dL (ref 0.61–1.24)
GFR calc Af Amer: >60 mL/min (ref >60)
GFR calc non Af Amer: >60 mL/min (ref >60)
Glucose, Bld: 106 mg/dL — ABNORMAL HIGH (ref 65–99)
Potassium: 4.5 mmol/L (ref 3.5–5.1)
Sodium: 138 mmol/L (ref 135–145)
Total Bilirubin: 0.3 mg/dL (ref 0.3–1.2)
Total Protein: 7.1 g/dL (ref 6.5–8.1)

## 2016-11-25 LAB — GLUCOSE, CAPILLARY: Glucose-Capillary: 130 mg/dL — ABNORMAL HIGH (ref 65–99)

## 2016-11-25 LAB — HEPARIN LEVEL (UNFRACTIONATED): Heparin Unfractionated: 0.48 IU/mL (ref 0.30–0.70)

## 2016-11-25 LAB — PROCALCITONIN: Procalcitonin: <0.1 ng/mL

## 2016-11-25 MED ORDER — SODIUM CHLORIDE 0.9% FLUSH
3.0000 mL | Freq: Two times a day (BID) | INTRAVENOUS | Status: DC
  Administered 2016-11-25 – 2016-11-27 (×3): 3 mL via INTRAVENOUS

## 2016-11-25 MED ORDER — PANTOPRAZOLE SODIUM 40 MG PO TBEC
40.0000 mg | DELAYED_RELEASE_TABLET | Freq: Every day | ORAL | Status: DC
  Administered 2016-11-26 – 2016-11-27 (×2): 40 mg via ORAL
  Filled 2016-11-25 (×2): qty 1

## 2016-11-25 MED ORDER — ACETAMINOPHEN 650 MG RE SUPP: 650.0000 mg | Freq: Four times a day (QID) | RECTAL | Status: DC | PRN

## 2016-11-25 MED ORDER — IOPAMIDOL (ISOVUE-300) INJECTION 61%
INTRAVENOUS | Status: AC
  Administered 2016-11-25: 60 mL
  Filled 2016-11-25: qty 30

## 2016-11-25 MED ORDER — ONDANSETRON HCL 4 MG PO TABS: 4.0000 mg | ORAL_TABLET | Freq: Four times a day (QID) | ORAL | Status: DC | PRN

## 2016-11-25 MED ORDER — SODIUM CHLORIDE 0.9 % IV SOLN: 250.0000 mL | INTRAVENOUS | Status: DC | PRN

## 2016-11-25 MED ORDER — MOMETASONE FURO-FORMOTEROL FUM 100-5 MCG/ACT IN AERO
2.0000 | INHALATION_SPRAY | Freq: Two times a day (BID) | RESPIRATORY_TRACT | Status: DC
  Administered 2016-11-25 – 2016-11-27 (×4): 2 via RESPIRATORY_TRACT
  Filled 2016-11-25: qty 8.8

## 2016-11-25 MED ORDER — IOPAMIDOL (ISOVUE-370) INJECTION 76%
80.0000 mL | Freq: Once | INTRAVENOUS | Status: AC | PRN
  Administered 2016-11-25: 80 mL via INTRAVENOUS

## 2016-11-25 MED ORDER — SODIUM CHLORIDE 0.9% FLUSH: 3.0000 mL | INTRAVENOUS | Status: DC | PRN

## 2016-11-25 MED ORDER — ONDANSETRON HCL 4 MG/2ML IJ SOLN: 4.0000 mg | Freq: Four times a day (QID) | INTRAMUSCULAR | Status: DC | PRN

## 2016-11-25 MED ORDER — HEPARIN (PORCINE) IN NACL 100-0.45 UNIT/ML-% IJ SOLN
1700.0000 [IU]/h | INTRAMUSCULAR | Status: AC
  Administered 2016-11-25 – 2016-11-26 (×2): 1700 [IU]/h via INTRAVENOUS
  Filled 2016-11-25 (×3): qty 250

## 2016-11-25 MED ORDER — ACETAMINOPHEN 325 MG PO TABS: 650.0000 mg | ORAL_TABLET | Freq: Four times a day (QID) | ORAL | Status: DC | PRN

## 2016-11-25 MED ORDER — ASPIRIN EC 81 MG PO TBEC
81.0000 mg | DELAYED_RELEASE_TABLET | Freq: Every day | ORAL | Status: DC
  Administered 2016-11-27: 81 mg via ORAL
  Filled 2016-11-25 (×2): qty 1

## 2016-11-25 MED ORDER — SODIUM CHLORIDE 0.9 % IV BOLUS (SEPSIS)
1000.0000 mL | Freq: Once | INTRAVENOUS | Status: AC
  Administered 2016-11-25: 1000 mL via INTRAVENOUS

## 2016-11-25 MED ORDER — SODIUM CHLORIDE 0.9 % IV SOLN
INTRAVENOUS | Status: DC
  Administered 2016-11-25: 13:00:00 via INTRAVENOUS

## 2016-11-25 MED ORDER — IOPAMIDOL (ISOVUE-300) INJECTION 61%
INTRAVENOUS | Status: AC
  Filled 2016-11-25: qty 100

## 2016-11-25 MED ORDER — SODIUM CHLORIDE 0.9 % IV SOLN
3.0000 g | Freq: Four times a day (QID) | INTRAVENOUS | Status: DC
  Administered 2016-11-25 – 2016-11-27 (×9): 3 g via INTRAVENOUS
  Filled 2016-11-25 (×11): qty 3

## 2016-11-25 MED ORDER — GUAIFENESIN ER 600 MG PO TB12
1200.0000 mg | ORAL_TABLET | Freq: Two times a day (BID) | ORAL | Status: DC
  Administered 2016-11-25 – 2016-11-27 (×4): 1200 mg via ORAL
  Filled 2016-11-25 (×4): qty 2

## 2016-11-25 MED ORDER — HEPARIN BOLUS VIA INFUSION
5500.0000 [IU] | Freq: Once | INTRAVENOUS | Status: AC
  Administered 2016-11-25: 5500 [IU] via INTRAVENOUS
  Filled 2016-11-25: qty 5500

## 2016-11-25 MED ORDER — NEBIVOLOL HCL 10 MG PO TABS
10.0000 mg | ORAL_TABLET | Freq: Every day | ORAL | Status: DC
  Administered 2016-11-25 – 2016-11-27 (×3): 10 mg via ORAL
  Filled 2016-11-25: qty 4
  Filled 2016-11-25 (×3): qty 1
  Filled 2016-11-25 (×2): qty 4

## 2016-11-25 MED ORDER — OXYCODONE-ACETAMINOPHEN 5-325 MG PO TABS
1.0000 | ORAL_TABLET | Freq: Once | ORAL | Status: AC
  Administered 2016-11-25: 1 via ORAL
  Filled 2016-11-25: qty 1

## 2016-11-25 MED ORDER — OXYCODONE-ACETAMINOPHEN 5-325 MG PO TABS
1.0000 | ORAL_TABLET | ORAL | Status: DC | PRN
  Administered 2016-11-25: 1 via ORAL
  Administered 2016-11-26 (×2): 2 via ORAL
  Administered 2016-11-27: 1 via ORAL
  Filled 2016-11-25: qty 1
  Filled 2016-11-25: qty 2
  Filled 2016-11-25: qty 1
  Filled 2016-11-25: qty 2

## 2016-11-25 MED ORDER — AMLODIPINE BESYLATE 5 MG PO TABS
5.0000 mg | ORAL_TABLET | Freq: Every day | ORAL | Status: DC
  Administered 2016-11-25 – 2016-11-27 (×3): 5 mg via ORAL
  Filled 2016-11-25 (×3): qty 1

## 2016-11-25 NOTE — ED Triage Notes (Signed)
Pt from PCP office with known multiple bilateral PEs from CT scan today.  Pt sent with IV.  NAD, A&O.

## 2016-11-25 NOTE — Progress Notes (Signed)
[place] for heparin Indication: pulmonary embolus  Allergies  Allergen Reactions  . No Known Allergies     Patient Measurements: Height: '6\' 1"'$  (185.4 cm) Weight: 222 lb 10.6 oz (101 kg) IBW/kg (Calculated) : 79.9 Heparin Dosing Weight: 100kg  Vital Signs: Temp: 98.6 F (37 C) (02/14 1649) Temp Source: Oral (02/14 1044) BP: 135/83 (02/14 1649) Pulse Rate: 77 (02/14 1649)  Labs:  Recent Labs  11/25/16 1133 11/25/16 2000  HGB 13.8  --   HCT 41.0  --   PLT 338  --   HEPARINUNFRC  --  0.48  CREATININE 1.05  --     Estimated Creatinine Clearance: 106.3 mL/min (by C-G formula based on SCr of 1.05 mg/dL).   Medical History: Past Medical History:  Diagnosis Date  . GERD 11/08/2009   Qualifier: Diagnosis of  By: Ronnald Ramp MD, Arvid Right.   . HYPERTENSION 11/08/2009   Qualifier: Diagnosis of  By: Ronnald Ramp MD, Arvid Right.   . Migraine 02/25/2012  . Pneumonia   . PONV (postoperative nausea and vomiting)   . Rotator cuff tear, left     Medications:  Infusions:  . heparin 1,700 Units/hr (11/25/16 1302)    Assessment: Calvin Wagner presented to the ED due to acute PE. To start IV heparin.   Initial heparin level is therapeutic at 0.48 on heparin 1700 units/hr. No issues with infusion or bleeding noted. Will confirm with morning labs.  Goal of Therapy:  Heparin level 0.3-0.7 units/ml Monitor platelets by anticoagulation protocol: Yes   Plan:  Continue heparin 1700 units/hr Daily heparin level and CBC  [name]. Diona Foley, PharmD, BCPS Clinical Pharmacist [phone] 11/25/2016,8:43 PM

## 2016-11-25 NOTE — Progress Notes (Signed)
Pharmacy Antibiotic Note  CASMER YEPIZ is a 51 y.o. male admitted on 11/25/2016 with pneumonia.  Pharmacy has been consulted for unasyn dosing. Pt is afebrile and WBC is WNL. Pt has good renal function.   Plan: Unasyn 3gm IV Q6H F/u renal fxn, C&S, clinical status   Height: '6\' 1"'$  (185.4 cm) Weight: 225 lb (102.1 kg) IBW/kg (Calculated) : 79.9  Temp (24hrs), Avg:98.6 F (37 C), Min:98.6 F (37 C), Max:98.6 F (37 C)   Recent Labs Lab 11/25/16 1133  WBC 7.8  CREATININE 1.05    Estimated Creatinine Clearance: 106.9 mL/min (by C-G formula based on SCr of 1.05 mg/dL).    Allergies  Allergen Reactions  . No Known Allergies     Antimicrobials this admission: Unasyn 2/14>>  Dose adjustments this admission: N/A  Microbiology results: Pending  Thank you for allowing pharmacy to be a part of this patient's care.  Michaelpaul Apo, Rande Lawman 11/25/2016 12:58 PM

## 2016-11-25 NOTE — Consult Note (Addendum)
Name: Calvin Wagner MRN: [id number] DOB: Feb 26, 1966    ADMISSION DATE:  11/25/2016 CONSULTATION DATE:  11/25/16  REFERRING MD :  Dr. Eulis Foster   CHIEF COMPLAINT:  SOB    HISTORY OF PRESENT ILLNESS:  51 y/o M, never smoker, who was seen by pulmonary on 10/20/16 for an abnormal CT of the chest and cough for 3 months.  Prior CT 09/18/16 demonstrated a 7x10 mm left lower lobe nodule that was unchanged from 06/08/16, worsening of left hilar adenopathy with moderate focal narrowing of the left lower bronchus.   At that time, he was planned for a bronchoscopy on 11/06/16.  However, while waiting for the FOB, he was out shoveling snow and suffered a mechanical fall with a rotator cuff injury.  On 2/1, he underwent a left shoulder arthroscopy with rotator cuff repair.  FOB was delayed due to surgery. He followed up on 2/13 in the pulmonary office and reported increased shortness of breath, fatigue with climbing stairs.  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  He was referred to [facility] for evaluation.  The patient was admitted for further evaluation.   Pt denies unexplained weight loss.  Reports shortness of breath with activity, increased over the past two weeks.  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  Second hand exposure of smoking from his parents.    Wife reports hx of eosinophilic esophagitis s/p inhaled steroids.    PAST MEDICAL HISTORY :   has a past medical history of GERD (11/08/2009); HYPERTENSION (11/08/2009); Migraine (02/25/2012); Pneumonia; PONV (postoperative nausea and vomiting); and Rotator cuff tear, left.   has a past surgical history that includes Inguinal hernia repair;  Total knee arthroplasty; Tonsillectomy; and Shoulder arthroscopy with rotator cuff repair (Left, 11/12/2016).  Prior to Admission medications   Medication Sig Start Date End Date Taking? Authorizing Provider  acetaminophen (TYLENOL) 325 MG tablet Take 650 mg by mouth every 6 (six) hours as needed for mild pain.   Yes Historical Provider, MD  amLODipine (NORVASC) 5 MG tablet Take 5 mg by mouth daily.   Yes Historical Provider, MD  aspirin EC 81 MG tablet Take 81 mg by mouth daily.   Yes Historical Provider, MD  budesonide-formoterol (SYMBICORT) 80-4.5 MCG/ACT inhaler Inhale 2 puffs into the lungs 2 (two) times daily. 10/27/16  Yes Tanda Rockers, MD  Multiple Vitamin (MULTIVITAMIN WITH MINERALS) TABS tablet Take 1 tablet by mouth daily.   Yes Historical Provider, MD  nebivolol (BYSTOLIC) 10 MG tablet Take 1 tablet (10 mg total) by mouth daily. 11/18/16  Yes Tanda Rockers, MD  oxyCODONE-acetaminophen (ROXICET) 5-325 MG tablet Take 1-2 tablets by mouth every 4 (four) hours as needed for severe pain. 11/12/16  Yes Danielle Laliberte, PA-C  pantoprazole (PROTONIX) 40 MG tablet Take 1 tablet (40 mg total) by mouth daily. Take 30-60 min before first meal of the day 10/20/16  Yes Tanda Rockers, MD  docusate sodium (COLACE) 100 MG capsule Take 1 capsule (100 mg total) by mouth 3 (three) times daily as needed. Patient not taking: Reported on 11/25/2016 11/12/16   Grier Mitts, PA-C  famotidine (PEPCID) 20 MG tablet One at bedtime Patient not taking: Reported on 11/25/2016 10/20/16   Christena Deem  Melvyn Novas, MD    Allergies  Allergen Reactions  . No Known Allergies     FAMILY HISTORY:  family history includes Heart attack (age of onset: 64) in his brother; Heart disease (age of onset: 24) in his mother.  SOCIAL HISTORY:  reports that he has never smoked. He has never used smokeless tobacco. He reports that he drinks alcohol. He reports that he does not use drugs.  REVIEW OF SYSTEMS:  POSITIVES IN  BOLD Constitutional: Negative for fever, chills, weight loss, malaise/fatigue and diaphoresis.  HENT: Negative for hearing loss, ear pain, nosebleeds, congestion, sore throat, neck pain, tinnitus and ear discharge.   Eyes: Negative for blurred vision, double vision, photophobia, pain, discharge and redness.  Respiratory: Negative for cough, hemoptysis, sputum production, shortness of breath, wheezing and stridor.   Cardiovascular: Negative for chest pain, palpitations, orthopnea, claudication, leg swelling and PND.  Gastrointestinal: Negative for heartburn, nausea, vomiting, abdominal pain, diarrhea, constipation, blood in stool and melena.  Genitourinary: Negative for dysuria, urgency, frequency, hematuria and flank pain.  Musculoskeletal: Negative for myalgias, back pain, joint pain and falls.  Skin: Negative for itching and rash.  Neurological: Negative for dizziness, tingling, tremors, sensory change, speech change, focal weakness, seizures, loss of consciousness, weakness and headaches.  Endo/Heme/Allergies: Negative for environmental allergies and polydipsia. Does not bruise/bleed easily.  SUBJECTIVE:   VITAL SIGNS: Temp:  [98.6 F (37 C)] 98.6 F (37 C) (02/14 1044) Pulse Rate:  [74-85] 74 (02/14 1139) Resp:  [18-20] 20 (02/14 1139) BP: (121-129)/(89-95) 129/89 (02/14 1139) SpO2:  [98 %-100 %] 100 % (02/14 1139) Weight:  [225 lb (102.1 kg)] 225 lb (102.1 kg) (02/14 1044)  PHYSICAL EXAMINATION: General:  Well developed adult male in NAD  Neuro:  AAOx4, speech clear, MAE  HEENT:  MM pink/moist, no jvd Cardiovascular:  s1s2 rrr, no m/r/g Lungs:  Even/non-labored, diminished LLL  Abdomen:  Soft, non-tender, bsx4 active Musculoskeletal:  No acute deformities  Skin:  Warm/dry, no edema  No results for input(s): NA, K, CL, CO2, BUN, CREATININE, GLUCOSE in the last 168 hours.  No results for input(s): HGB, HCT, WBC, PLT in the last 168 hours.  Dg Chest 2 View  Result Date:  11/24/2016 CLINICAL DATA:  Cough and shortness of breath. EXAM: CHEST  2 VIEW COMPARISON:  Multiple prior chest x-rays and a chest CT from 09/18/2016 FINDINGS: Persistent left lower lobe process possibly a combination of atelectasis and infiltrate. The pleural effusion however has resolved. The right lung remains clear. The cardiac silhouette, mediastinal and hilar contours stable. The bony structures are intact. IMPRESSION: Resolving left pleural effusion but persistent atelectasis and or infiltrate. Electronically Signed   By: Marijo Sanes M.D.   On: 11/24/2016 10:17   Ct Angio Chest W/cm &/or Wo Cm  Result Date: 11/25/2016 CLINICAL DATA:  Positive D-dimer. Recent rotator cuff surgery. Evaluate for pulmonary embolism. EXAM: CT ANGIOGRAPHY CHEST WITH CONTRAST TECHNIQUE: Multidetector CT imaging of the chest was performed using the standard protocol during bolus administration of intravenous contrast. Multiplanar CT image reconstructions and MIPs were obtained to evaluate the vascular anatomy. CONTRAST:  80 cc Isovue 370 intravenous COMPARISON:  09/18/2016 FINDINGS: Cardiovascular: There are multiple bilateral segmental and subsegmental central filling defects consistent with acute pulmonary emboli. Most proximal clots is seen at the bifurcation of the right apical segmental pulmonary arteries. At least 6 separate foci of emboli are seen. RV to LV ratio is near 1, but there is no pulmonary artery dilatation or hepatic vein reflux  in this ratio may be overestimated due to the angle of imaging. Mediastinum/Nodes: Progressive left hilar adenopathy/mass with lower lobe bronchial invasion/obstruction. The masslike area measures up to 4 cm. Lingular airways are also involved/obstructed; anticipate PET-CT. No contralateral adenopathy is noted. Lungs/Pleura: Postobstructive volume loss and opacification in the lingula and left lower lobe. 1 cm nodule in the lateral costophrenic sulcus on the left. Small left pleural  effusion. Upper Abdomen: No acute finding or visible metastasis. Musculoskeletal: Scoliosis.  No acute or aggressive finding. Critical Value/emergent results were called by telephone at the time of interpretation on 11/25/2016 at 10:06 am to Dr. Rexene Edison , who verbally acknowledged these results. Review of the MIP images confirms the above findings. IMPRESSION: 1. Multiple bilateral acute pulmonary emboli, segmental to subsegmental size. 2. 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 There is complete obstruction of the lingular and left lower lobe airways. 1 cm pulmonary nodule in the left lateral costophrenic sulcus. Patient is scheduled for bronchoscopy. Electronically Signed   By: Monte Fantasia M.D.   On: 11/25/2016 10:09      SIGNIFICANT EVENTS 10/20/16  Seen at Pulmonary for abnormal CT chest and cough x3 months 11/06/16  Planned FOB but suffered mechanical fall in the snow with rotator cuff tear 11/12/16  L shoulder arthroscopy with rotator cuff repair post fall 11/24/16  Follow up in pulmonary office, reports SOB, CTA ordered  ............ 11/25/16  Admit to [facility] with PE, increased size of left hilar mass   STUDIES:  CTA Chest 2/14 >> multiple bilateral acute pulmonary emboli, segmental to subsegmental size, progressive L hilar mass / adenopathy consistent with bronchogenic carcinoma, complete obstruction of the lingular and LLL airways, 1 cm pulmonary nodule in the left lateral costophrenic sulcus    ASSESSMENT / PLAN:  Left Hilar Lung Mass - increased  Plan: No role for FOB for now, will need anticoagulation for PE at least 2 weeks prior to disruption  Follow up in the pulmonary office on 2/23 with Dr. Melvyn Novas to set up plans for FOB / anticoagulation instructions   Multiple Bilateral Acute Pulmonary Emboli   Plan: Heparin gtt per pharmacy for now Assess BLE venous duplex  Assess LUE venous duplex post surgery Would transition to lovenox  at discharge given suspicion of malignancy and to allow for FOB Can transition to oral anticoagulation post FOB  Monitor inpatient 24-48 hours to ensure no bleeding / hemoptysis with obstruction of airway / coughing   LLL Post-Obstructive PNA   Plan: Unasyn empirically Pulmonary hygiene - IS, mobilize   Noe Gens, NP-C [facility] Pgr: [phone] or if no answer [phone] 11/25/2016, 12:10 PM   STAFF NOTE: I, Merrie Roof, MD FACP have personally reviewed patient's available data, including medical history, events of note, physical examination and test results as part of my evaluation. I have discussed with resident/NP and other care providers such as pharmacist, RN and RRT. In addition, I personally evaluated patient and elicited key findings of: awake, no distress, CTA overall, slight coarse left, , no increased edema legs, arm in sling, this appears to be small PE in setting immobility, likely cancer lung (r/o adeno) and recent surgery, admit to hospital heparin drip for now, would observe him in hospital x 48 hours to ensure no hemoptysis in setting of bronchus involvement and post obstructive PNA, add unasyn, assess doppler legs and lue,  NO role echo, at 24 hours consider transition to lovenox treatment (if no hemoptysis) , then  in 2 weeks for bronch with Dr Melvyn Novas where he can dc Lovenox according to timing of case, I spent time with pt and wife answering questions stressing concerns growth and impingement of mass as etiology likely cancer  Lavon Paganini. Titus Mould, MD, [place] Pgr: [facility] 11/25/2016 1:28 PM

## 2016-11-25 NOTE — H&P (Signed)
History and Physical    Calvin Wagner EHU:[id number] DOB: [birth year] DOA: 11/25/2016  PCP: [name], MD Patient coming from: Home  Chief Complaint: SOB  HPI: Calvin Wagner is a 51 y.o. male with medical history significant of GERD, hypertension, migraines, (acute cuff tear with recent repair. Patient presenting to [facility] for recent outpatient diagnoses of pulmonary embolism found on imaging study on the day of admission. Study had been ordered by his outpatient pulmonologist in preparation for a planned bronchoscopy for better evaluation of a lung nodule. Patient currently complaining of worsening shortness of breath. Endorses 2 month history of ongoing cough. Of note patient underwent orthopedic repair of his left rotator cuff after sustaining a tear during a fall. This was done on 11/12/2016. Denies any fevers, productive cough, nausea, vomiting, chest pain, palpitations, asymmetrical weakness, dizziness/vertigo.    ED Course: Objective findings outlined below. Pulmonology consult. Started on Unasyn, heparin.  Review of Systems: As per HPI otherwise 10 point review of systems negative.   Ambulatory Status: no restrictions  Past Medical History:  Diagnosis Date  . GERD 11/08/2009   Qualifier: Diagnosis of  By: Ronnald Ramp MD, Arvid Right.   . HYPERTENSION 11/08/2009   Qualifier: Diagnosis of  By: Ronnald Ramp MD, Arvid Right.   . Migraine 02/25/2012  . Pneumonia   . PONV (postoperative nausea and vomiting)   . Rotator cuff tear, left     Past Surgical History:  Procedure Laterality Date  . INGUINAL HERNIA REPAIR    . SHOULDER ARTHROSCOPY WITH ROTATOR CUFF REPAIR Left 11/12/2016   Procedure: SHOULDER ARTHROSCOPY WITH ROTATOR CUFF REPAIR AND SUBACROMIAL DECOMPRESSION;  Surgeon: Tania Ade, MD;  Location: [facility];  Service: Orthopedics;  Laterality: Left;  SHOULDER ARTHROSCOPY WITH ROTATOR CUFF REPAIR AND SUBACROMIAL DECOMPRESSION  . TONSILLECTOMY    . TOTAL KNEE ARTHROPLASTY       Social History   Social History  . Marital status: Married    Spouse name: Baker Janus  . Number of children: 2  . Years of education: N/A   Occupational History  . Material [place]   Social History Main Topics  . Smoking status: Never Smoker  . Smokeless tobacco: Never Used  . Alcohol use Yes     Comment: social  . Drug use: No  . Sexual activity: Not Currently   Other Topics Concern  . Not on file   Social History Narrative   Lives at home wife and 2 kids   Caffeine use: none      Allergies  Allergen Reactions  . No Known Allergies     Family History  Problem Relation Age of Onset  . Heart disease Mother 36  . Lung cancer      family history  . CAD      strong family history male and male <50  . Mental retardation    . Heart attack Brother 32  . Alcohol abuse Neg Hx   . Diabetes Neg Hx   . Early death Neg Hx   . Hyperlipidemia Neg Hx   . Hypertension Neg Hx   . Kidney disease Neg Hx   . Stroke Neg Hx   . Migraines Neg Hx     Prior to Admission medications   Medication Sig Start Date End Date Taking? Authorizing Provider  acetaminophen (TYLENOL) 325 MG tablet Take 650 mg by mouth every 6 (six) hours as needed for mild pain.   Yes Historical Provider, MD  amLODipine (NORVASC) 5  MG tablet Take 5 mg by mouth daily.   Yes Historical Provider, MD  aspirin EC 81 MG tablet Take 81 mg by mouth daily.   Yes Historical Provider, MD  budesonide-formoterol (SYMBICORT) 80-4.5 MCG/ACT inhaler Inhale 2 puffs into the lungs 2 (two) times daily. 10/27/16  Yes Tanda Rockers, MD  Multiple Vitamin (MULTIVITAMIN WITH MINERALS) TABS tablet Take 1 tablet by mouth daily.   Yes Historical Provider, MD  nebivolol (BYSTOLIC) 10 MG tablet Take 1 tablet (10 mg total) by mouth daily. 11/18/16  Yes Tanda Rockers, MD  oxyCODONE-acetaminophen (ROXICET) 5-325 MG tablet Take 1-2 tablets by mouth every 4 (four) hours as needed for severe pain. 11/12/16  Yes Danielle Laliberte,  PA-C  pantoprazole (PROTONIX) 40 MG tablet Take 1 tablet (40 mg total) by mouth daily. Take 30-60 min before first meal of the day 10/20/16  Yes Tanda Rockers, MD  docusate sodium (COLACE) 100 MG capsule Take 1 capsule (100 mg total) by mouth 3 (three) times daily as needed. Patient not taking: Reported on 11/25/2016 11/12/16   Grier Mitts, PA-C  famotidine (PEPCID) 20 MG tablet One at bedtime Patient not taking: Reported on 11/25/2016 10/20/16   Tanda Rockers, MD    Physical Exam: Vitals:   11/25/16 1530 11/25/16 1545 11/25/16 1600 11/25/16 1615  BP: 128/91 146/92 143/88 137/89  Pulse: 76 78 76 74  Resp: '19 19 18 17  '$ Temp:      TempSrc:      SpO2: 95% 92% 94% 95%  Weight:      Height:         General:  Appears calm and comfortable Eyes:  PERRL, EOMI, normal lids, iris ENT:  grossly normal hearing, lips & tongue, mmm Neck:  no LAD, masses or thyromegaly Cardiovascular:  RRR, no m/r/g. No LE edema.  Respiratory:  A few intermittent crackles in left mid to lower lung fields.  Normal respiratory effort. Abdomen:  soft, ntnd, NABS Skin:  no rash or induration seen on limited exam Musculoskeletal:  Left arm in sling. No bony abnormalities. Moves all other extremities in normal coordinated fashion and with full range of motion.  Psychiatric:  grossly normal mood and affect, speech fluent and appropriate, AOx3 Neurologic:  CN 2-12 grossly intact, moves all extremities in coordinated fashion, sensation intact  Labs on Admission: I have personally reviewed following labs and imaging studies  CBC:  Recent Labs Lab 11/25/16 1133  WBC 7.8  NEUTROABS 6.1  HGB 13.8  HCT 41.0  MCV 92.6  PLT 599   Basic Metabolic Panel:  Recent Labs Lab 11/25/16 1133  NA 138  K 4.5  CL 101  CO2 26  GLUCOSE 106*  BUN 12  CREATININE 1.05  CALCIUM 10.2   GFR: Estimated Creatinine Clearance: 106.9 mL/min (by C-G formula based on SCr of 1.05 mg/dL). Liver Function Tests:  Recent  Labs Lab 11/25/16 1133  AST 25  ALT 22  ALKPHOS 82  BILITOT 0.3  PROT 7.1  ALBUMIN 4.0   No results for input(s): LIPASE, AMYLASE in the last 168 hours. No results for input(s): AMMONIA in the last 168 hours. Coagulation Profile: No results for input(s): INR, PROTIME in the last 168 hours. Cardiac Enzymes: No results for input(s): CKTOTAL, CKMB, CKMBINDEX, TROPONINI in the last 168 hours. BNP (last 3 results) No results for input(s): PROBNP in the last 8760 hours. HbA1C: No results for input(s): HGBA1C in the last 72 hours. CBG: No results for input(s): GLUCAP in  the last 168 hours. Lipid Profile: No results for input(s): CHOL, HDL, LDLCALC, TRIG, CHOLHDL, LDLDIRECT in the last 72 hours. Thyroid Function Tests: No results for input(s): TSH, T4TOTAL, FREET4, T3FREE, THYROIDAB in the last 72 hours. Anemia Panel: No results for input(s): VITAMINB12, FOLATE, FERRITIN, TIBC, IRON, RETICCTPCT in the last 72 hours. Urine analysis:    Component Value Date/Time   COLORURINE STRAW (A) 11/25/2016 1219   APPEARANCEUR CLEAR 11/25/2016 1219   LABSPEC 1.033 (H) 11/25/2016 1219   PHURINE 6.0 11/25/2016 1219   GLUCOSEU NEGATIVE 11/25/2016 1219   HGBUR NEGATIVE 11/25/2016 1219   BILIRUBINUR NEGATIVE 11/25/2016 1219   KETONESUR NEGATIVE 11/25/2016 1219   PROTEINUR NEGATIVE 11/25/2016 1219   UROBILINOGEN 0.2 12/28/2007 1339   NITRITE NEGATIVE 11/25/2016 1219   LEUKOCYTESUR NEGATIVE 11/25/2016 1219    Creatinine Clearance: Estimated Creatinine Clearance: 106.9 mL/min (by C-G formula based on SCr of 1.05 mg/dL).  Sepsis Labs: '@LABRCNTIP'$ (procalcitonin:4,lacticidven:4) )No results found for this or any previous visit (from the past 240 hour(s)).   Radiological Exams on Admission: Dg Chest 2 View  Result Date: 11/24/2016 CLINICAL DATA:  Cough and shortness of breath. EXAM: CHEST  2 VIEW COMPARISON:  Multiple prior chest x-rays and a chest CT from 09/18/2016 FINDINGS: Persistent left  lower lobe process possibly a combination of atelectasis and infiltrate. The pleural effusion however has resolved. The right lung remains clear. The cardiac silhouette, mediastinal and hilar contours stable. The bony structures are intact. IMPRESSION: Resolving left pleural effusion but persistent atelectasis and or infiltrate. Electronically Signed   By: Marijo Sanes M.D.   On: 11/24/2016 10:17   Ct Angio Chest W/cm &/or Wo Cm  Result Date: 11/25/2016 CLINICAL DATA:  Positive D-dimer. Recent rotator cuff surgery. Evaluate for pulmonary embolism. EXAM: CT ANGIOGRAPHY CHEST WITH CONTRAST TECHNIQUE: Multidetector CT imaging of the chest was performed using the standard protocol during bolus administration of intravenous contrast. Multiplanar CT image reconstructions and MIPs were obtained to evaluate the vascular anatomy. CONTRAST:  80 cc Isovue 370 intravenous COMPARISON:  09/18/2016 FINDINGS: Cardiovascular: There are multiple bilateral segmental and subsegmental central filling defects consistent with acute pulmonary emboli. Most proximal clots is seen at the bifurcation of the right apical segmental pulmonary arteries. At least 6 separate foci of emboli are seen. RV to LV ratio is near 1, but there is no pulmonary artery dilatation or hepatic vein reflux in this ratio may be overestimated due to the angle of imaging. Mediastinum/Nodes: Progressive left hilar adenopathy/mass with lower lobe bronchial invasion/obstruction. The masslike area measures up to 4 cm. Lingular airways are also involved/obstructed; anticipate PET-CT. No contralateral adenopathy is noted. Lungs/Pleura: Postobstructive volume loss and opacification in the lingula and left lower lobe. 1 cm nodule in the lateral costophrenic sulcus on the left. Small left pleural effusion. Upper Abdomen: No acute finding or visible metastasis. Musculoskeletal: Scoliosis.  No acute or aggressive finding. Critical Value/emergent results were called by  telephone at the time of interpretation on 11/25/2016 at 10:06 am to Dr. Rexene Edison , who verbally acknowledged these results. Review of the MIP images confirms the above findings. IMPRESSION: 1. Multiple bilateral acute pulmonary emboli, segmental to subsegmental size. 2. 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 There is complete obstruction of the lingular and left lower lobe airways. 1 cm pulmonary nodule in the left lateral costophrenic sulcus. Patient is scheduled for bronchoscopy. Electronically Signed   By: Monte Fantasia M.D.   On: 11/25/2016 10:09    Assessment/Plan Active Problems:  GERD   Asthmatic bronchitis , chronic (HCC)   Cancer ([place])   Obstructive pneumonia   Acute pulmonary embolism ([place])    Pulmonary embolism: likely due to recent surgery and being more sedentary and malignancy. No evidence for cardiac strain - Hep x24hrs (monitor for pulm hemorrhage) w/ transition to lovenox per vs Eliquis CCM.  - BLE and LUE duplex to evaluate DVT - DC on Eliquis  Obstructive CAP: LLL. Secondary to mass. Persistent cough since contracting pneumonia in early January.  - Unasyn (DC on augmentin) - sputum Cx, legionella and strep Ag, Procalcitonin - Chest pt - Mucinex  Lung Mass: highly suspicious for bronchogenic carcinoma. Pt aware. Biopsy needed. Pulm following and recommending outpt Biopsy by Dr. Melvyn Novas w/ coordination of temporary cessation of anticoagulation.  - Outpt f/u w/ Dr. Melvyn Novas for bronc and biopsy - CT abd pelvis w/ contrast (will need to wait until 11/26/16 as had contrasted study on 11/25/16 - ordered), IR consult if more accessible lesion found.   HTN: - continue bystolic, Norvasc  GERD: - continue protonix  Asthma/Bronchitis: - continue symbicort - albuterol prn   DVT prophylaxis: Hep  Code Status: full  Family Communication: wife  Disposition Plan: pending 48 hr admissoin for workup and monitoring for bleeding from  anticoagulation for PE w/ lung mass  Consults called: pulmonology  Admission status: inpt    MERRELL, DAVID J MD [facility]  If 7PM-7AM, please contact night-coverage [email or web address] Password [facility]  11/25/2016, 4:44 PM

## 2016-11-25 NOTE — ED Notes (Signed)
Delay in admin of heparin. Admitting physicians at bedside.

## 2016-11-25 NOTE — Progress Notes (Signed)
ANTICOAGULATION CONSULT NOTE - Initial Consult  Pharmacy Consult for heparin Indication: pulmonary embolus  Allergies  Allergen Reactions  . No Known Allergies     Patient Measurements: Height: '6\' 1"'$  (185.4 cm) Weight: 225 lb (102.1 kg) IBW/kg (Calculated) : 79.9 Heparin Dosing Weight: 100kg  Vital Signs: Temp: 98.6 F (37 C) (02/14 1044) Temp Source: Oral (02/14 1044) BP: 121/95 (02/14 1044) Pulse Rate: 85 (02/14 1044)  Labs: No results for input(s): HGB, HCT, PLT, APTT, LABPROT, INR, HEPARINUNFRC, HEPRLOWMOCWT, CREATININE, CKTOTAL, CKMB, TROPONINI in the last 72 hours.  Estimated Creatinine Clearance: 99.3 mL/min (by C-G formula based on SCr of 1.13 mg/dL).   Medical History: Past Medical History:  Diagnosis Date  . GERD 11/08/2009   Qualifier: Diagnosis of  By: Ronnald Ramp MD, Arvid Right.   . HYPERTENSION 11/08/2009   Qualifier: Diagnosis of  By: Ronnald Ramp MD, Arvid Right.   . Migraine 02/25/2012  . Pneumonia   . PONV (postoperative nausea and vomiting)   . Rotator cuff tear, left     Medications:  Infusions:  . sodium chloride    . heparin      Assessment: 67 yom presented to the ED due to acute PE. To start IV heparin. Baseline labs pending but CBC has been WNL in the past. He is not on anticoagulation PTA.   Goal of Therapy:  Heparin level 0.3-0.7 units/ml Monitor platelets by anticoagulation protocol: Yes   Plan:  Heparin bolus 5500 units IV x 1 Heparin gtt 1700 units/hr Check a 6 hr heparin level Daily heparin level and CBC  Hammond Obeirne, Rande Lawman 11/25/2016,11:36 AM

## 2016-11-25 NOTE — ED Notes (Signed)
Admitting Provider at bedside. 

## 2016-11-25 NOTE — ED Provider Notes (Signed)
[facility] DEPT Provider Note   CSN: [id number] Arrival date & time: 11/25/16  1037     History   Chief Complaint Chief Complaint  Patient presents with  . PE    HPI Calvin Wagner is a 51 y.o. male.  He presents for evaluation of known pulmonary embolus, found on imaging this morning. He is being evaluated, by pulmonary, for a lung nodule with planned bronchoscopy in 2 days. He was seen yesterday by them, for worsening trouble breathing, and imaging was ordered for today. He was told that he had pulmonary embolus. He does not know that he has cancer. The patient has had a chronic ongoing cough, for 2 months, and felt bad for 2 years. He recently injured his left rotator cuff. He is hypertensive. He did not take his medications this morning. He ate a little bit earlier and is hungry now. He denies fever, productive sputum, nausea, vomiting, focal weakness or dizziness. There are no other known modifying factors.      HPI  Past Medical History:  Diagnosis Date  . GERD 11/08/2009   Qualifier: Diagnosis of  By: Ronnald Ramp MD, Arvid Right.   . HYPERTENSION 11/08/2009   Qualifier: Diagnosis of  By: Ronnald Ramp MD, Arvid Right.   . Migraine 02/25/2012  . Pneumonia   . PONV (postoperative nausea and vomiting)   . Rotator cuff tear, left     Patient Active Problem List   Diagnosis Date Noted  . Lung nodule 11/24/2016  . Dyspnea 11/24/2016  . Cough 10/20/2016  . Pulmonary infiltrates on CXR 10/20/2016  . Asthmatic bronchitis , chronic ([place]) 10/20/2016  . Headache 10/10/2015  . Worsening headaches 10/10/2015  . Vision changes 10/10/2015  . Snoring 10/10/2015  . Uncontrolled morning headache 10/10/2015  . 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 Dizziness 10/10/2015  . Nausea with vomiting 10/10/2015  . Chest pain 07/21/2014  . Alcohol dependence ([place]) 07/21/2014  . Abdominal pain 07/21/2014  . Chest pain, atypical 07/21/2014  . Chest pain at rest 07/21/2014  . Other abnormal  glucose 10/26/2012  . Essential hypertension 11/08/2009  . GERD 11/08/2009    Past Surgical History:  Procedure Laterality Date  . INGUINAL HERNIA REPAIR    . SHOULDER ARTHROSCOPY WITH ROTATOR CUFF REPAIR Left 11/12/2016   Procedure: SHOULDER ARTHROSCOPY WITH ROTATOR CUFF REPAIR AND SUBACROMIAL DECOMPRESSION;  Surgeon: Tania Ade, MD;  Location: [facility];  Service: Orthopedics;  Laterality: Left;  SHOULDER ARTHROSCOPY WITH ROTATOR CUFF REPAIR AND SUBACROMIAL DECOMPRESSION  . TONSILLECTOMY    . TOTAL KNEE ARTHROPLASTY         Home Medications    Prior to Admission medications   Medication Sig Start Date End Date Taking? Authorizing Provider  amLODipine (NORVASC) 5 MG tablet Take 5 mg by mouth daily.    Historical Provider, MD  aspirin EC 81 MG tablet Take 81 mg by mouth daily.    Historical Provider, MD  budesonide-formoterol (SYMBICORT) 80-4.5 MCG/ACT inhaler Inhale 2 puffs into the lungs 2 (two) times daily. 10/27/16   Tanda Rockers, MD  docusate sodium (COLACE) 100 MG capsule Take 1 capsule (100 mg total) by mouth 3 (three) times daily as needed. 11/12/16   Grier Mitts, PA-C  famotidine (PEPCID) 20 MG tablet One at bedtime Patient taking differently: Take 20 mg by mouth at bedtime. One at bedtime 10/20/16   Tanda Rockers, MD  Multiple Vitamin (MULTIVITAMIN WITH MINERALS) TABS tablet Take 1 tablet by mouth daily.    Historical Provider, MD  nebivolol (BYSTOLIC) 10 MG tablet Take 1 tablet (10 mg total) by mouth daily. 11/18/16   Tanda Rockers, MD  oxyCODONE-acetaminophen (ROXICET) 5-325 MG tablet Take 1-2 tablets by mouth every 4 (four) hours as needed for severe pain. 11/12/16   Grier Mitts, PA-C  pantoprazole (PROTONIX) 40 MG tablet Take 1 tablet (40 mg total) by mouth daily. Take 30-60 min before first meal of the day 10/20/16   Tanda Rockers, MD  SUMAtriptan (IMITREX) 50 MG tablet TAKE 1 TABLET BY MOUTH AS NEEDED AT START OF MIGRAINE. MAY REPEAT 1 DOSE IN 1HR IF NEEDED  09/18/15   Historical Provider, MD  traMADol (ULTRAM) 50 MG tablet Take 50 mg by mouth every 6 (six) hours as needed for moderate pain.    Historical Provider, MD    Family History Family History  Problem Relation Age of Onset  . Heart disease Mother 65  . Lung cancer      family history  . CAD      strong family history male and male <50  . Mental retardation    . Heart attack Brother 29  . Alcohol abuse Neg Hx   . Diabetes Neg Hx   . Early death Neg Hx   . Hyperlipidemia Neg Hx   . Hypertension Neg Hx   . Kidney disease Neg Hx   . Stroke Neg Hx   . Migraines Neg Hx     Social History Social History  Substance Use Topics  . Smoking status: Never Smoker  . Smokeless tobacco: Never Used  . Alcohol use Yes     Comment: social     Allergies   No known allergies   Review of Systems Review of Systems  All other systems reviewed and are negative.    Physical Exam Updated Vital Signs BP 121/95 (BP Location: Right Arm)   Pulse 85   Temp 98.6 F (37 C) (Oral)   Resp 18   Ht '6\' 1"'$  (1.854 m)   Wt 225 lb (102.1 kg)   SpO2 98%   BMI 29.69 kg/m   Physical Exam  Constitutional: He is oriented to person, place, and time. He appears well-developed and well-nourished. No distress.  HENT:  Head: Normocephalic and atraumatic.  Right Ear: External ear normal.  Left Ear: External ear normal.  Eyes: Conjunctivae and EOM are normal. Pupils are equal, round, and reactive to light.  Neck: Normal range of motion and phonation normal. Neck supple.  No stridor  Cardiovascular: Normal rate, regular rhythm and normal heart sounds.   Pulmonary/Chest: Effort normal. He exhibits no bony tenderness.  Decreased breath sounds left base relative to the right. No wheezes, Rales or rhonchi.  Abdominal: Soft. There is no tenderness. There is no guarding.  Musculoskeletal: He exhibits no edema.  Left shoulder immobilizer sling on the left arm.  Neurological: He is alert and oriented to  person, place, and time. No cranial nerve deficit or sensory deficit. He exhibits normal muscle tone. Coordination normal.  Skin: Skin is warm, dry and intact.  Psychiatric: He has a normal mood and affect. His behavior is normal. Judgment and thought content normal.  Nursing note and vitals reviewed.    ED Treatments / Results  Labs (all labs ordered are listed, but only abnormal results are displayed) Labs Reviewed  COMPREHENSIVE METABOLIC PANEL  CBC WITH DIFFERENTIAL/PLATELET  URINALYSIS, ROUTINE W REFLEX MICROSCOPIC  HEPARIN LEVEL (UNFRACTIONATED)    EKG  EKG Interpretation None  Radiology Dg Chest 2 View  Result Date: 11/24/2016 CLINICAL DATA:  Cough and shortness of breath. EXAM: CHEST  2 VIEW COMPARISON:  Multiple prior chest x-rays and a chest CT from 09/18/2016 FINDINGS: Persistent left lower lobe process possibly a combination of atelectasis and infiltrate. The pleural effusion however has resolved. The right lung remains clear. The cardiac silhouette, mediastinal and hilar contours stable. The bony structures are intact. IMPRESSION: Resolving left pleural effusion but persistent atelectasis and or infiltrate. Electronically Signed   By: Marijo Sanes M.D.   On: 11/24/2016 10:17   Ct Angio Chest W/cm &/or Wo Cm  Result Date: 11/25/2016 CLINICAL DATA:  Positive D-dimer. Recent rotator cuff surgery. Evaluate for pulmonary embolism. EXAM: CT ANGIOGRAPHY CHEST WITH CONTRAST TECHNIQUE: Multidetector CT imaging of the chest was performed using the standard protocol during bolus administration of intravenous contrast. Multiplanar CT image reconstructions and MIPs were obtained to evaluate the vascular anatomy. CONTRAST:  80 cc Isovue 370 intravenous COMPARISON:  09/18/2016 FINDINGS: Cardiovascular: There are multiple bilateral segmental and subsegmental central filling defects consistent with acute pulmonary emboli. Most proximal clots is seen at the bifurcation of the right  apical segmental pulmonary arteries. At least 6 separate foci of emboli are seen. RV to LV ratio is near 1, but there is no pulmonary artery dilatation or hepatic vein reflux in this ratio may be overestimated due to the angle of imaging. Mediastinum/Nodes: Progressive left hilar adenopathy/mass with lower lobe bronchial invasion/obstruction. The masslike area measures up to 4 cm. Lingular airways are also involved/obstructed; anticipate PET-CT. No contralateral adenopathy is noted. Lungs/Pleura: Postobstructive volume loss and opacification in the lingula and left lower lobe. 1 cm nodule in the lateral costophrenic sulcus on the left. Small left pleural effusion. Upper Abdomen: No acute finding or visible metastasis. Musculoskeletal: Scoliosis.  No acute or aggressive finding. Critical Value/emergent results were called by telephone at the time of interpretation on 11/25/2016 at 10:06 am to Dr. Rexene Edison , who verbally acknowledged these results. Review of the MIP images confirms the above findings. IMPRESSION: 1. Multiple bilateral acute pulmonary emboli, segmental to subsegmental size. 2. 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 There is complete obstruction of the lingular and left lower lobe airways. 1 cm pulmonary nodule in the left lateral costophrenic sulcus. Patient is scheduled for bronchoscopy. Electronically Signed   By: Monte Fantasia M.D.   On: 11/25/2016 10:09    Procedures Procedures (including critical care time)  Medications Ordered in ED Medications  0.9 %  sodium chloride infusion (not administered)  heparin bolus via infusion 5,500 Units (not administered)  heparin ADULT infusion 100 units/mL (25000 units/282m sodium chloride 0.45%) (not administered)     Initial Impression / Assessment and Plan / ED Course  I have reviewed the triage vital signs and the nursing notes.  Pertinent labs & imaging results that were available during my  care of the patient were reviewed by me and considered in my medical decision making (see chart for details).  Clinical Course as of Nov 25 1144  Wed Nov 25, 2016  1145 The case was discussed with the nurse practitioner who saw him yesterday in the office. She had tried to direct admit the patient, but no beds were available, so he was sent here. She states that she believes that the pulmonary service will admit the patient.  [EW]    Clinical Course User Index [EW] EDaleen Bo MD    Medications  0.9 %  sodium chloride  infusion (not administered)  heparin bolus via infusion 5,500 Units (not administered)  heparin ADULT infusion 100 units/mL (25000 units/298m sodium chloride 0.45%) (not administered)    Patient Vitals for the past 24 hrs:  BP Temp Temp src Pulse Resp SpO2 Height Weight  11/25/16 1139 129/89 - - 74 20 100 % - -  11/25/16 1044 121/95 98.6 F (37 C) Oral 85 18 98 % '6\' 1"'$  (1.854 m) 225 lb (102.1 kg)   11:50- Paged CCM- Dr. FTitus Mouldrequests patient be admitted by the hospitalist service and he will see as a cOptometrist  Seen by pulmonary who recommended admission for monitoring, with redness for hemoptysis and worsening respiratory status due to bronchial obstruction. He recommended initiation of Unasyn for possible postobstructive pneumonia. He anticipates outpatient evaluation for lung cancer.  1:43 PM-Consult complete with hospitalist. Patient case explained and discussed. He agrees to admit patient for further evaluation and treatment. Call ended at 13:58  11:48 AM Reevaluation with update and discussion. After initial assessment and treatment, an updated evaluation reveals his left shoulder surgery. Pain medication ordered. Findings discussed with the patient, all questions answered. Aniqua Briere L    Final Clinical Impressions(s) / ED Diagnoses   Final diagnoses:  Other acute pulmonary embolism without acute cor pulmonale (HCC)  Lung nodule  Bronchial  obstruction  Hilar mass   He presents for evaluation of known pulmonary emboli. He is also being evaluated for left lung nodule. No prior diagnosis of cancer. CTA done earlier today, reviewed indicates bilateral pulmonary emboli, with out heart strain. Also apparently advancing left lung hilar mass, with bronchial obstruction. IV heparin, ordered for anticoagulation. Patient is hemodynamically stable. He is at risk for worsening condition, therefore will be admitted.   Nursing Notes Reviewed/ Care Coordinated, and agree without changes. Applicable Imaging Reviewed.  Interpretation of Laboratory Data incorporated into ED treatment  Plan: Admit  New Prescriptions New Prescriptions   No medications on file     EDaleen Bo MD 11/25/16 1401

## 2016-11-26 ENCOUNTER — Encounter (HOSPITAL_COMMUNITY): Admission: RE | Payer: Self-pay | Source: Ambulatory Visit

## 2016-11-26 ENCOUNTER — Inpatient Hospital Stay (HOSPITAL_COMMUNITY): Payer: 59

## 2016-11-26 ENCOUNTER — Encounter (HOSPITAL_COMMUNITY): Payer: 59

## 2016-11-26 ENCOUNTER — Ambulatory Visit (HOSPITAL_COMMUNITY): Admission: RE | Admit: 2016-11-26 | Payer: 59 | Source: Ambulatory Visit | Admitting: Internal Medicine

## 2016-11-26 ENCOUNTER — Encounter (HOSPITAL_COMMUNITY): Payer: Self-pay | Admitting: Radiology

## 2016-11-26 DIAGNOSIS — M79609 Pain in unspecified limb: Secondary | ICD-10-CM

## 2016-11-26 DIAGNOSIS — C801 Malignant (primary) neoplasm, unspecified: Secondary | ICD-10-CM

## 2016-11-26 DIAGNOSIS — M7989 Other specified soft tissue disorders: Secondary | ICD-10-CM

## 2016-11-26 DIAGNOSIS — R918 Other nonspecific abnormal finding of lung field: Secondary | ICD-10-CM

## 2016-11-26 DIAGNOSIS — J9809 Other diseases of bronchus, not elsewhere classified: Secondary | ICD-10-CM

## 2016-11-26 DIAGNOSIS — R911 Solitary pulmonary nodule: Secondary | ICD-10-CM

## 2016-11-26 LAB — CBC
HCT: 40 % (ref 39.0–52.0)
Hemoglobin: 13.3 g/dL (ref 13.0–17.0)
MCH: 31 pg (ref 26.0–34.0)
MCHC: 33.3 g/dL (ref 30.0–36.0)
MCV: 93.2 fL (ref 78.0–100.0)
Platelets: 312 10*3/uL (ref 150–400)
RBC: 4.29 MIL/uL (ref 4.22–5.81)
RDW: 13.1 % (ref 11.5–15.5)
WBC: 7 10*3/uL (ref 4.0–10.5)

## 2016-11-26 LAB — BASIC METABOLIC PANEL
Anion gap: 10 (ref 5–15)
BUN: 8 mg/dL (ref 6–20)
CO2: 26 mmol/L (ref 22–32)
Calcium: 9.6 mg/dL (ref 8.9–10.3)
Chloride: 103 mmol/L (ref 101–111)
Creatinine, Ser: 1.01 mg/dL (ref 0.61–1.24)
GFR calc Af Amer: >60 mL/min (ref >60)
GFR calc non Af Amer: >60 mL/min (ref >60)
Glucose, Bld: 103 mg/dL — ABNORMAL HIGH (ref 65–99)
Potassium: 3.6 mmol/L (ref 3.5–5.1)
Sodium: 139 mmol/L (ref 135–145)

## 2016-11-26 LAB — HIV ANTIBODY (ROUTINE TESTING W REFLEX): HIV Screen 4th Generation wRfx: NONREACTIVE

## 2016-11-26 LAB — HEPARIN LEVEL (UNFRACTIONATED): Heparin Unfractionated: 0.56 IU/mL (ref 0.30–0.70)

## 2016-11-26 LAB — STREP PNEUMONIAE URINARY ANTIGEN: Strep Pneumo Urinary Antigen: NEGATIVE

## 2016-11-26 SURGERY — VIDEO BRONCHOSCOPY WITHOUT FLUORO
Anesthesia: Moderate Sedation | Laterality: Bilateral

## 2016-11-26 MED ORDER — DOCUSATE SODIUM 100 MG PO CAPS
100.0000 mg | ORAL_CAPSULE | Freq: Two times a day (BID) | ORAL | Status: DC
Start: 2016-11-26 — End: 2016-11-27
  Administered 2016-11-26 – 2016-11-27 (×3): 100 mg via ORAL
  Filled 2016-11-26 (×3): qty 1

## 2016-11-26 MED ORDER — ENOXAPARIN SODIUM 100 MG/ML ~~LOC~~ SOLN
1.0000 mg/kg | Freq: Two times a day (BID) | SUBCUTANEOUS | Status: DC
  Administered 2016-11-26 – 2016-11-27 (×2): 100 mg via SUBCUTANEOUS
  Filled 2016-11-26 (×2): qty 1

## 2016-11-26 MED ORDER — IOPAMIDOL (ISOVUE-300) INJECTION 61%
INTRAVENOUS | Status: AC
  Administered 2016-11-26: 100 mL
  Filled 2016-11-26: qty 100

## 2016-11-26 NOTE — Progress Notes (Signed)
Incentive Spirometry valued over CPT at this time, due to present Pulmonary Emboli present.

## 2016-11-26 NOTE — Consult Note (Signed)
Name: Calvin Wagner MRN: [id number] DOB: [birth year]    ADMISSION DATE:  11/25/2016 CONSULTATION DATE:  11/25/16  REFERRING MD :  Dr. Eulis Foster   CHIEF COMPLAINT:  SOB    HISTORY OF PRESENT ILLNESS:  51 y/o M, never smoker, who was seen by pulmonary on 10/20/16 for an abnormal CT of the chest and cough for 3 months.  Prior CT 09/18/16 demonstrated a 7x10 mm left lower lobe nodule that was unchanged from 06/08/16, worsening of left hilar adenopathy with moderate focal narrowing of the left lower bronchus.   At that time, he was planned for a bronchoscopy on 11/06/16.  However, while waiting for the FOB, he was out shoveling snow and suffered a mechanical fall with a rotator cuff injury.  On 2/1, he underwent a left shoulder arthroscopy with rotator cuff repair.  FOB was delayed due to surgery. He followed up on 2/13 in the pulmonary office and reported increased shortness of breath, fatigue with climbing stairs.  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  He was referred to [facility] for evaluation.  The patient was admitted for further evaluation.   Pt denies unexplained weight loss.  Reports shortness of breath with activity, increased over the past two weeks.  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  Second hand exposure of smoking from his parents.    Wife reports hx of eosinophilic esophagitis s/p inhaled steroids.    SUBJECTIVE: no distress, uneventful night, NO HEMOPTYSIS on therapetic heparin  VITAL SIGNS: Temp:  [98.2 F (36.8 C)-98.6 F (37 C)] 98.2 F (36.8 C) (02/15 0611) Pulse Rate:  [71-98] 87 (02/15 0611) Resp:  [14-23] 18 (02/15 0611) BP: (121-146)/(79-95) 123/79 (02/15  0611) SpO2:  [92 %-100 %] 96 % (02/15 0925) Weight:  [101 kg (222 lb 10.6 oz)-102.1 kg (225 lb)] 101 kg (222 lb 10.6 oz) (02/14 1651)  PHYSICAL EXAMINATION: General:  Well developed male Neuro:  AAOx4, speech clear, MAE  HEENT: jvd wnl Cardiovascular:  s1s2 rrr, no m/r/g Lungs:  Reduced LLL  Abdomen:  Soft, non-tender, bsx4 active, no mass Musculoskeletal:  No acute deformities  Skin:  Warm/dry, no edema   Recent Labs Lab 11/25/16 1133 11/26/16 0438  NA 138 139  K 4.5 3.6  CL 101 103  CO2 26 26  BUN 12 8  CREATININE 1.05 1.01  GLUCOSE 106* 103*     Recent Labs Lab 11/25/16 1133 11/26/16 0438  HGB 13.8 13.3  HCT 41.0 40.0  WBC 7.8 7.0  PLT 338 312    Ct Angio Chest W/cm &/or Wo Cm  Result Date: 11/25/2016 CLINICAL DATA:  Positive D-dimer. Recent rotator cuff surgery. Evaluate for pulmonary embolism. EXAM: CT ANGIOGRAPHY CHEST WITH CONTRAST TECHNIQUE: Multidetector CT imaging of the chest was performed using the standard protocol during bolus administration of intravenous contrast. Multiplanar CT image reconstructions and MIPs were obtained to evaluate the vascular anatomy. CONTRAST:  80 cc Isovue 370 intravenous COMPARISON:  09/18/2016 FINDINGS: Cardiovascular: There are multiple bilateral segmental and subsegmental central filling defects consistent with acute pulmonary emboli. Most proximal clots is seen at the bifurcation of the right apical segmental pulmonary arteries. At least 6 separate foci of emboli are seen. RV to LV ratio is  near 1, but there is no pulmonary artery dilatation or hepatic vein reflux in this ratio may be overestimated due to the angle of imaging. Mediastinum/Nodes: Progressive left hilar adenopathy/mass with lower lobe bronchial invasion/obstruction. The masslike area measures up to 4 cm. Lingular airways are also involved/obstructed; anticipate PET-CT. No contralateral adenopathy is noted. Lungs/Pleura: Postobstructive volume loss and opacification  in the lingula and left lower lobe. 1 cm nodule in the lateral costophrenic sulcus on the left. Small left pleural effusion. Upper Abdomen: No acute finding or visible metastasis. Musculoskeletal: Scoliosis.  No acute or aggressive finding. Critical Value/emergent results were called by telephone at the time of interpretation on 11/25/2016 at 10:06 am to Dr. Rexene Edison , who verbally acknowledged these results. Review of the MIP images confirms the above findings. IMPRESSION: 1. Multiple bilateral acute pulmonary emboli, segmental to subsegmental size. 2. 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 There is complete obstruction of the lingular and left lower lobe airways. 1 cm pulmonary nodule in the left lateral costophrenic sulcus. Patient is scheduled for bronchoscopy. Electronically Signed   By: Monte Fantasia M.D.   On: 11/25/2016 10:09      SIGNIFICANT EVENTS 10/20/16  Seen at Pulmonary for abnormal CT chest and cough x3 months 11/06/16  Planned FOB but suffered mechanical fall in the snow with rotator cuff tear 11/12/16  L shoulder arthroscopy with rotator cuff repair post fall 11/24/16  Follow up in pulmonary office, reports SOB, CTA ordered  ............ 11/25/16  Admit to [facility] with PE, increased size of left hilar mass   STUDIES:  CTA Chest 2/14 >> multiple bilateral acute pulmonary emboli, segmental to subsegmental size, progressive L hilar mass / adenopathy consistent with bronchogenic carcinoma, complete obstruction of the lingular and LLL airways, 1 cm pulmonary nodule in the left lateral costophrenic sulcus   ASSESSMENT / PLAN:  Left Hilar Lung Mass - increased Multiple Bilateral Acute Pulmonary Emboli  LLL Post-Obstructive PNA   Plan: Agree for CT abdo, likely will be negative , but reasonable assessment for associated mass lesions Did well over night on heparin drip without hemoptysis, if CT neg would then change over to lovenox in setting  likely lung malignancy  He would dc out of bid lovenox and then see Dr Melvyn Novas for bronch bx in about 2 weeks Dopplers - pending No need echo at this time In am on lovenox , would ambulate and check pulse ox, pending to make sure no mobile clot ( updated Dr Melvyn Novas, agrees plan) Await upper ext doppler also for lue Would treat 7 days ABX if remains culture neg for post obstruction clearly noted on CT chest I updated pt and wife today If neg mobile clot, no hemoptysis, and no desat in am may be able to dc home  Lavon Paganini. Titus Mould, MD, [place] Pgr: [facility] 11/26/2016 10:42 AM

## 2016-11-26 NOTE — Progress Notes (Signed)
PROGRESS NOTE  Calvin Wagner  IWL:[id number] DOB: [birth year]  DOA: 11/25/2016 PCP: [name], MD   Brief Narrative:  51 year old male, lifelong nonsmoker, PMH of GERD, HTN, migraine, seen by pulmonology on 10/20/16 for an abnormal CT of the chest and cough of 3 months, prior CT 09/18/16 demonstrated 7 x 10 mm LLL nodule that was unchanged from 06/08/16, plans were for bronchoscopy on 11/06/16. However while awaiting FOB, patient sustained a mechanical fall and left rotator cuff injury for which he underwent left shoulder arthroscopy and rotator cuff repair on 11/12/16 and FOB had to be delayed. He followed up on 2/13 in the pulmonology office and reported worsening dyspnea, fatigue climbing stairs. 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 He was referred to [facility] for admission. Pulmonology consulted.   Assessment & Plan:   Active Problems:   GERD   Asthmatic bronchitis , chronic (HCC)   Cancer ([place])   Obstructive pneumonia   Acute pulmonary embolism ([place])   1. Acute pulmonary embolism: Multiple and bilateral. In the context of likely bronchogenic carcinoma. Started initially on IV heparin infusion. Pulmonology follow up appreciated. Discussed with Dr. Titus Mould. Recommends transitioning to  therapeutic dose Lovenox tonight pending insurance approval, reassess in a.m. to make sure that he does not have any hemoptysis (has not had any thus far) or hypoxia and then can be discharged home on oral Augmentin to complete total 7 days treatment. Pulmonology will arrange outpatient follow-up with Dr. Melvyn Novas for bronchoscopy and biopsy in about 2 weeks. Bilateral lower extremity and left upper extremity venous Doppler negative for DVT. As per pulmonology, no echo indicated at this time. Pulmonology will see  patient on 2/16 a.m. prior to discharge. 2. Postobstructive pneumonia, LLL: Secondary to lung mass. Continue IV Unasyn through tomorrow then transitioned to oral Augmentin at discharge to complete total 7 days treatment. 3. Left hilar lung mass: Suspicious for bronchogenic carcinoma. Outpatient evaluation with bronchoscopy and biopsy in 2 weeks. Pulmonology will arrange follow-up. 4. Left rotator cuff injury, status post repair 11/12/16: Continue sling and outpatient follow-up with orthopedics. 5. Essential hypertension: Controlled. Continue Bystolic and Norvasc. 6. GERD: Continue Protonix.   DVT prophylaxis: Currently on IV heparin infusion. Code Status: Full Family Communication: None at bedside Disposition Plan: DC home likely 11/27/16   Consultants:   Pulmonology  Procedures:   None  Antimicrobials:   IV Unasyn    Subjective: Dyspnea on exertion. Denies chest pain or cough.  Objective:  Vitals:   11/25/16 2252 11/26/16 0611 11/26/16 0925 11/26/16 1415  BP:  123/79  120/68  Pulse:  87  74  Resp:  18  18  Temp:  98.2 F (36.8 C)    TempSrc:  Oral    SpO2: 96% 97% 96% 99%  Weight:      Height:        Intake/Output Summary (Last 24 hours) at 11/26/16 1422 Last data filed at 11/26/16 0900  Gross per 24 hour  Intake           969.51 ml  Output             1000 ml  Net           -30.49 ml   Filed Weights   11/25/16 1044 11/25/16 1651  Weight: 102.1 kg (225 lb) 101 kg (222 lb 10.6 oz)  Examination:  General exam: Pleasant young male lying comfortably supine in bed. Respiratory system: Reduced breath sounds in the left base. The rest of lung fields clear to auscultation. Respiratory effort normal. Cardiovascular system: S1 & S2 heard, RRR. No JVD, murmurs, rubs, gallops or clicks. No pedal edema. Telemetry: Sinus rhythm. Gastrointestinal system: Abdomen is nondistended, soft and nontender. No organomegaly or masses felt. Normal bowel sounds heard. Central  nervous system: Alert and oriented. No focal neurological deficits. Extremities: Symmetric 5 x 5 power. Left upper extremity in a sling. Skin: No rashes, lesions or ulcers Psychiatry: Judgement and insight appear normal. Mood & affect appropriate.     Data Reviewed: I have personally reviewed following labs and imaging studies  CBC:  Recent Labs Lab 11/25/16 1133 11/26/16 0438  WBC 7.8 7.0  NEUTROABS 6.1  --   HGB 13.8 13.3  HCT 41.0 40.0  MCV 92.6 93.2  PLT 338 382   Basic Metabolic Panel:  Recent Labs Lab 11/25/16 1133 11/26/16 0438  NA 138 139  K 4.5 3.6  CL 101 103  CO2 26 26  GLUCOSE 106* 103*  BUN 12 8  CREATININE 1.05 1.01  CALCIUM 10.2 9.6   GFR: Estimated Creatinine Clearance: 110.5 mL/min (by C-G formula based on SCr of 1.01 mg/dL). Liver Function Tests:  Recent Labs Lab 11/25/16 1133  AST 25  ALT 22  ALKPHOS 82  BILITOT 0.3  PROT 7.1  ALBUMIN 4.0   No results for input(s): LIPASE, AMYLASE in the last 168 hours. No results for input(s): AMMONIA in the last 168 hours. Coagulation Profile: No results for input(s): INR, PROTIME in the last 168 hours. Cardiac Enzymes: No results for input(s): CKTOTAL, CKMB, CKMBINDEX, TROPONINI in the last 168 hours. BNP (last 3 results) No results for input(s): PROBNP in the last 8760 hours. HbA1C: No results for input(s): HGBA1C in the last 72 hours. CBG:  Recent Labs Lab 11/25/16 2120  GLUCAP 130*   Lipid Profile: No results for input(s): CHOL, HDL, LDLCALC, TRIG, CHOLHDL, LDLDIRECT in the last 72 hours. Thyroid Function Tests: No results for input(s): TSH, T4TOTAL, FREET4, T3FREE, THYROIDAB in the last 72 hours. Anemia Panel: No results for input(s): VITAMINB12, FOLATE, FERRITIN, TIBC, IRON, RETICCTPCT in the last 72 hours.  Sepsis Labs:  Recent Labs Lab 11/25/16 1133 11/25/16 1647 11/26/16 0438  PROCALCITON  --  <0.10  --   WBC 7.8  --  7.0    No results found for this or any previous  visit (from the past 240 hour(s)).       Radiology Studies: Ct Angio Chest W/cm &/or Wo Cm  Result Date: 11/25/2016 CLINICAL DATA:  Positive D-dimer. Recent rotator cuff surgery. Evaluate for pulmonary embolism. EXAM: CT ANGIOGRAPHY CHEST WITH CONTRAST TECHNIQUE: Multidetector CT imaging of the chest was performed using the standard protocol during bolus administration of intravenous contrast. Multiplanar CT image reconstructions and MIPs were obtained to evaluate the vascular anatomy. CONTRAST:  80 cc Isovue 370 intravenous COMPARISON:  09/18/2016 FINDINGS: Cardiovascular: There are multiple bilateral segmental and subsegmental central filling defects consistent with acute pulmonary emboli. Most proximal clots is seen at the bifurcation of the right apical segmental pulmonary arteries. At least 6 separate foci of emboli are seen. RV to LV ratio is near 1, but there is no pulmonary artery dilatation or hepatic vein reflux in this ratio may be overestimated due to the angle of imaging. Mediastinum/Nodes: Progressive left hilar adenopathy/mass with lower lobe bronchial invasion/obstruction. The masslike area measures up  to 4 cm. Lingular airways are also involved/obstructed; anticipate PET-CT. No contralateral adenopathy is noted. Lungs/Pleura: Postobstructive volume loss and opacification in the lingula and left lower lobe. 1 cm nodule in the lateral costophrenic sulcus on the left. Small left pleural effusion. Upper Abdomen: No acute finding or visible metastasis. Musculoskeletal: Scoliosis.  No acute or aggressive finding. Critical Value/emergent results were called by telephone at the time of interpretation on 11/25/2016 at 10:06 am to Dr. Rexene Edison , who verbally acknowledged these results. Review of the MIP images confirms the above findings. IMPRESSION: 1. Multiple bilateral acute pulmonary emboli, segmental to subsegmental size. 2. Progressive left hilar mass/adenopathy consistent with a  bronchogenic carcinoma. There is complete obstruction of the lingular and left lower lobe airways. 1 cm pulmonary nodule in the left lateral costophrenic sulcus. Patient is scheduled for bronchoscopy. Electronically Signed   By: Monte Fantasia M.D.   On: 11/25/2016 10:09   Ct Abdomen Pelvis W Contrast  Result Date: 11/26/2016 CLINICAL DATA:  Lung mass and adenopathy EXAM: CT ABDOMEN AND PELVIS WITH CONTRAST TECHNIQUE: Multidetector CT imaging of the abdomen and pelvis was performed using the standard protocol following bolus administration of intravenous contrast. CONTRAST:  167m ISOVUE-300 IOPAMIDOL (ISOVUE-300) INJECTION 61% COMPARISON:  CT abdomen and pelvis January 23, 2016; CT angiogram chest November 25, 2016 FINDINGS: Lower chest: Consolidation in the left lower lobe with left pleural effusion stable from 1 day prior. Incomplete visualization of left hilar adenopathy, demonstrated 1 day prior. Hepatobiliary: There is fatty infiltration in the liver near the fissure for the ligamentum teres. No liver mass lesion identified. There is questionable sludge in the gallbladder. The gallbladder wall is not appreciably thickened. There is no biliary duct dilatation. Pancreas: No pancreatic mass or inflammatory focus. Spleen: No splenic lesions are evident. There is a tiny accessory spleen inferior to the spleen. Adrenals/Urinary Tract: Adrenals appear normal bilaterally. There is a 1 x 1 cm cyst in the anterior lower pole right kidney. No hydronephrosis on either side. No renal or ureteral calculus on either side. Urinary bladder is midline with wall thickness within normal limits. Stomach/Bowel: There is no bowel wall or mesenteric thickening. No bowel obstruction. No free air or portal venous air. Vascular/Lymphatic: There is no abdominal aortic aneurysm. There is mild calcification in the aorta. Major mesenteric vessels appear patent. There is no evident adenopathy in the abdomen or pelvis. Reproductive: There  are a few scattered prostatic calculi. Prostate and seminal vesicles are normal in size and contour. No pelvic mass evident. Other: Appendix appears normal. There is no ascites or abscess in the abdomen or pelvis. No omental lesions are evident. There is a small ventral hernia containing only fat. There is fat in each inguinal ring. Patient has had previous inguinal hernia repairs. Musculoskeletal: There is degenerative change in the lumbar spine, most notably at L5-S1. There is no evident blastic or lytic bone lesion. No intramuscular or abdominal wall lesion. IMPRESSION: Consolidation with questionable associated mass left lower lobe. Small left pleural effusion. Incomplete visualization left hilar adenopathy. These findings are stable compared to 1 day prior. No mass or adenopathy in the abdomen or pelvis. A bowel obstruction. No abscess. Appendix appears unremarkable. No renal or ureteral calculus. No hydronephrosis. There are a few small prostatic calculi. Small ventral hernia containing only fat. Questionable sludge in gallbladder.  No gallbladder wall thickening. Electronically Signed   By: WLowella GripIII M.D.   On: 11/26/2016 11:35        Scheduled  Meds: . amLODipine  5 mg Oral Daily  . ampicillin-sulbactam (UNASYN) IV  3 g Intravenous Q6H  . aspirin EC  81 mg Oral Daily  . guaiFENesin  1,200 mg Oral BID  . mometasone-formoterol  2 puff Inhalation BID  . nebivolol  10 mg Oral Daily  . pantoprazole  40 mg Oral QAC breakfast  . sodium chloride flush  3 mL Intravenous Q12H   Continuous Infusions: . heparin 1,700 Units/hr (11/26/16 0018)     LOS: 1 day      [facility], MD [facility] Pager [phone]  If 7PM-7AM, please contact night-coverage [email or web address] Password [email or web address] 11/26/2016, 2:22 PM

## 2016-11-26 NOTE — Progress Notes (Addendum)
Benefit check sent for Lovenox '100mg'$  BID.  Jacqualin Combes, RN  Cc: Cecille Rubin, RN        S/W  [facility] @ [facility] RX # [phone]    1. LOVENOX 100 MG BID  COVER- YES  CO-PAY-  $7.62  TIER- 2 DRUG  PRIOR APPROVAL- NO   2. ENOXAPARIN  100 MG BID  COVER- YES  CO-PAY- $ 7.62  TIER- 1 DRUG  PRIOR APPROVAL- NO   PREFERRED PHARMACY : [facility]

## 2016-11-26 NOTE — Progress Notes (Signed)
[place] for heparin Indication: pulmonary embolus  Allergies  Allergen Reactions  . No Known Allergies     Patient Measurements: Height: '6\' 1"'$  (185.4 cm) Weight: 222 lb 10.6 oz (101 kg) IBW/kg (Calculated) : 79.9 Heparin Dosing Weight: 100kg  Vital Signs: Temp: 98.2 F (36.8 C) (02/15 0611) Temp Source: Oral (02/15 0611) BP: 123/79 (02/15 0611) Pulse Rate: 87 (02/15 0611)  Labs:  Recent Labs  11/25/16 1133 11/25/16 2000 11/26/16 0438  HGB 13.8  --  13.3  HCT 41.0  --  40.0  PLT 338  --  312  HEPARINUNFRC  --  0.48 0.56  CREATININE 1.05  --  1.01    Estimated Creatinine Clearance: 110.5 mL/min (by C-G formula based on SCr of 1.01 mg/dL).  Medications:  Infusions:  . heparin 1,700 Units/hr (11/26/16 0018)    Assessment: 49 yom presented to the ED due to acute PE and started on a heparin drip.   Heparin level is therapeutic at 0.56. No bleeding noted, CBC stable. Plan is to change to Lovenox upon discharge.   Goal of Therapy:  Heparin level 0.3-0.7 units/ml Monitor platelets by anticoagulation protocol: Yes   Plan:  Continue heparin drip at 1700 units/hr Daily heparin level and CBC Monitor for s/sx of bleeding   Renold Genta, PharmD, BCPS Clinical Pharmacist Phone for today - [place] - [phone] 11/26/2016 11:22 AM

## 2016-11-26 NOTE — Progress Notes (Signed)
[place] for heparin transition to lovenox Indication: pulmonary embolus  Allergies  Allergen Reactions  . No Known Allergies     Patient Measurements: Height: '6\' 1"'$  (185.4 cm) Weight: 222 lb 10.6 oz (101 kg) IBW/kg (Calculated) : 79.9 Heparin Dosing Weight: 100kg  Vital Signs: Temp: 98.2 F (36.8 C) (02/15 0611) Temp Source: Oral (02/15 0611) BP: 120/68 (02/15 1415) Pulse Rate: 74 (02/15 1415)  Labs:  Recent Labs  11/25/16 1133 11/25/16 2000 11/26/16 0438  HGB 13.8  --  13.3  HCT 41.0  --  40.0  PLT 338  --  312  HEPARINUNFRC  --  0.48 0.56  CREATININE 1.05  --  1.01    Estimated Creatinine Clearance: 110.5 mL/min (by C-G formula based on SCr of 1.01 mg/dL).  Medications:  Infusions:  . heparin 1,700 Units/hr (11/26/16 0018)    Assessment: 41 yom presented to the ED due to acute PE and started on a heparin drip.   Heparin level is therapeutic at 0.56. No bleeding noted, CBC stable. Plan is to change to Lovenox upon discharge.   Goal of Therapy:  Heparin level 0.3-0.7 units/ml Monitor platelets by anticoagulation protocol: Yes   Plan:  Start lovenox '1mg'$ /kg subcutaneously q12h tonight at 2200 Stop heparin at 2100 Monitor for s/sx of bleeding   [name]. Diona Foley, PharmD, BCPS Clinical Pharmacist 11/26/2016 2:50 PM

## 2016-11-26 NOTE — Progress Notes (Signed)
*  PRELIMINARY RESULTS* Vascular Ultrasound Left upper extremity venous duplex has been completed.  Preliminary findings: the visualized veins of the left upper extremity appear negative for deep and superficial vein thrombosis.  Calvin Wagner 11/26/2016, 11:58 AM

## 2016-11-26 NOTE — Progress Notes (Signed)
*  PRELIMINARY RESULTS* Vascular Ultrasound Bilateral lower extremity venous duplex has been completed.  Preliminary findings: No evidence of deep vein thrombosis or baker's cysts bilaterally.   Everrett Coombe 11/26/2016, 11:57 AM

## 2016-11-27 ENCOUNTER — Other Ambulatory Visit: Payer: Self-pay

## 2016-11-27 ENCOUNTER — Telehealth: Payer: Self-pay | Admitting: Adult Health

## 2016-11-27 DIAGNOSIS — I2699 Other pulmonary embolism without acute cor pulmonale: Principal | ICD-10-CM

## 2016-11-27 DIAGNOSIS — J189 Pneumonia, unspecified organism: Secondary | ICD-10-CM

## 2016-11-27 LAB — CBC
HCT: 38.1 % — ABNORMAL LOW (ref 39.0–52.0)
Hemoglobin: 12.7 g/dL — ABNORMAL LOW (ref 13.0–17.0)
MCH: 31 pg (ref 26.0–34.0)
MCHC: 33.3 g/dL (ref 30.0–36.0)
MCV: 92.9 fL (ref 78.0–100.0)
Platelets: 274 10*3/uL (ref 150–400)
RBC: 4.1 MIL/uL — ABNORMAL LOW (ref 4.22–5.81)
RDW: 13.2 % (ref 11.5–15.5)
WBC: 5.3 10*3/uL (ref 4.0–10.5)

## 2016-11-27 LAB — PROCALCITONIN: Procalcitonin: <0.1 ng/mL

## 2016-11-27 MED ORDER — AMOXICILLIN-POT CLAVULANATE 875-125 MG PO TABS: 1.0000 | ORAL_TABLET | Freq: Two times a day (BID) | ORAL | 0 refills | Status: AC

## 2016-11-27 MED ORDER — GUAIFENESIN ER 600 MG PO TB12: 1200.0000 mg | ORAL_TABLET | Freq: Two times a day (BID) | ORAL | Status: AC

## 2016-11-27 MED ORDER — ENOXAPARIN SODIUM 150 MG/ML ~~LOC~~ SOLN: 100.0000 mg | Freq: Two times a day (BID) | SUBCUTANEOUS | 0 refills | Status: DC

## 2016-11-27 MED ORDER — ENOXAPARIN SODIUM 100 MG/ML ~~LOC~~ SOLN: 100.0000 mg | Freq: Two times a day (BID) | SUBCUTANEOUS | 0 refills | Status: DC

## 2016-11-27 MED ORDER — AMLODIPINE BESYLATE 10 MG PO TABS: 10.0000 mg | ORAL_TABLET | Freq: Every day | ORAL | 0 refills | Status: DC

## 2016-11-27 MED ORDER — AMOXICILLIN-POT CLAVULANATE 875-125 MG PO TABS: 1.0000 | ORAL_TABLET | Freq: Two times a day (BID) | ORAL | Status: DC

## 2016-11-27 NOTE — Discharge Instructions (Signed)
Follow with Primary MD [name], MD in 7 days   Get CBC, CMP,  checked  by Primary MD next visit.    Activity: As tolerated with Full fall precautions use walker/cane & assistance as needed   Disposition Home    Diet: Regular diet   On your next visit with your primary care physician please Get Medicines reviewed and adjusted.   Please request your Prim.MD to go over all Hospital Tests and Procedure/Radiological results at the follow up, please get all Hospital records sent to your Prim MD by signing hospital release before you go home.   If you experience worsening of your admission symptoms, develop shortness of breath, life threatening emergency, suicidal or homicidal thoughts you must seek medical attention immediately by calling 911 or calling your MD immediately  if symptoms less severe.  You Must read complete instructions/literature along with all the possible adverse reactions/side effects for all the Medicines you take and that have been prescribed to you. Take any new Medicines after you have completely understood and accpet all the possible adverse reactions/side effects.   Do not drive, operating heavy machinery, perform activities at heights, swimming or participation in water activities or provide baby sitting services if your were admitted for syncope or siezures until you have seen by Primary MD or a Neurologist and advised to do so again.  Do not drive when taking Pain medications.    Do not take more than prescribed Pain, Sleep and Anxiety Medications  Special Instructions: If you have smoked or chewed Tobacco  in the last 2 yrs please stop smoking, stop any regular Alcohol  and or any Recreational drug use.  Wear Seat belts while driving.   Please note  You were cared for by a hospitalist during your hospital stay. If you have any questions about your discharge medications or the care you received while you were in the hospital after you are discharged,  you can call the unit and asked to speak with the hospitalist on call if the hospitalist that took care of you is not available. Once you are discharged, your primary care physician will handle any further medical issues. Please note that NO REFILLS for any discharge medications will be authorized once you are discharged, as it is imperative that you return to your primary care physician (or establish a relationship with a primary care physician if you do not have one) for your aftercare needs so that they can reassess your need for medications and monitor your lab values.

## 2016-11-27 NOTE — Consult Note (Signed)
Name: Calvin Wagner MRN: [id number] DOB: [birth year]    ADMISSION DATE:  11/25/2016 CONSULTATION DATE:  11/25/16  REFERRING MD :  Dr. Eulis Foster   CHIEF COMPLAINT:  SOB    HISTORY OF PRESENT ILLNESS:  51 y/o M, never smoker, who was seen by pulmonary on 10/20/16 for an abnormal CT of the chest and cough for 3 months.  Prior CT 09/18/16 demonstrated a 7x10 mm left lower lobe nodule that was unchanged from 06/08/16, worsening of left hilar adenopathy with moderate focal narrowing of the left lower bronchus.   At that time, he was planned for a bronchoscopy on 11/06/16.  However, while waiting for the FOB, he was out shoveling snow and suffered a mechanical fall with a rotator cuff injury.  On 2/1, he underwent a left shoulder arthroscopy with rotator cuff repair.  FOB was delayed due to surgery. He followed up on 2/13 in the pulmonary office and reported increased shortness of breath, fatigue with climbing stairs.  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  He was referred to [facility] for evaluation.  The patient was admitted for further evaluation.   Pt denies unexplained weight loss.  Reports shortness of breath with activity, increased over the past two weeks.  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  Second hand exposure of smoking from his parents.    Wife reports hx of eosinophilic esophagitis s/p inhaled steroids.    SUBJECTIVE:  Feels good today. Breathing is better every day.Was started on Lovenox 2/15. 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  VITAL SIGNS: Temp:  [98.2 F (36.8 C)-99 F (37.2 C)] 98.2 F  (36.8 C) (02/16 0439) Pulse Rate:  [66-84] 84 (02/16 0439) Resp:  [16-18] 16 (02/16 0439) BP: (118-124)/(68-82) 118/82 (02/16 0439) SpO2:  [95 %-99 %] 98 % (02/16 0439)  PHYSICAL EXAMINATION:  General- No distress,  A&Ox3, pleasant  ENT: No sinus tenderness, TM clear, pale nasal mucosa, no oral exudate,no post nasal drip, no LAN, No JVD Cardiac: S1, S2, regular rate and rhythm, no murmur Chest: No wheeze/ rales/ dullness; no accessory muscle use, no nasal flaring, no sternal retractions, slightly diminished per bases Abd.: Soft Non-tender, flat Ext: No clubbing cyanosis, edema Neuro:  normal strength, A&O x 4 , MAE x 3, Clear speech, appropriate Skin: No rashes, warm and dry Psych: Slightly anxious   Recent Labs Lab 11/25/16 1133 11/26/16 0438  NA 138 139  K 4.5 3.6  CL 101 103  CO2 26 26  BUN 12 8  CREATININE 1.05 1.01  GLUCOSE 106* 103*     Recent Labs Lab 11/25/16 1133 11/26/16 0438 11/27/16 0446  HGB 13.8 13.3 12.7*  HCT 41.0 40.0 38.1*  WBC 7.8 7.0 5.3  PLT 338 312 274    Ct Angio Chest W/cm &/or Wo Cm  Result Date: 11/25/2016 CLINICAL DATA:  Positive D-dimer. Recent rotator cuff surgery. Evaluate for pulmonary embolism. EXAM: CT ANGIOGRAPHY CHEST WITH CONTRAST TECHNIQUE: Multidetector CT imaging of the chest was performed using the standard protocol during bolus administration of intravenous contrast. Multiplanar CT image reconstructions and MIPs were obtained to evaluate the vascular anatomy. CONTRAST:  80  cc Isovue 370 intravenous COMPARISON:  09/18/2016 FINDINGS: Cardiovascular: There are multiple bilateral segmental and subsegmental central filling defects consistent with acute pulmonary emboli. Most proximal clots is seen at the bifurcation of the right apical segmental pulmonary arteries. At least 6 separate foci of emboli are seen. RV to LV ratio is near 1, but there is no pulmonary artery dilatation or hepatic vein reflux in this ratio may be overestimated  due to the angle of imaging. Mediastinum/Nodes: Progressive left hilar adenopathy/mass with lower lobe bronchial invasion/obstruction. The masslike area measures up to 4 cm. Lingular airways are also involved/obstructed; anticipate PET-CT. No contralateral adenopathy is noted. Lungs/Pleura: Postobstructive volume loss and opacification in the lingula and left lower lobe. 1 cm nodule in the lateral costophrenic sulcus on the left. Small left pleural effusion. Upper Abdomen: No acute finding or visible metastasis. Musculoskeletal: Scoliosis.  No acute or aggressive finding. Critical Value/emergent results were called by telephone at the time of interpretation on 11/25/2016 at 10:06 am to Dr. Rexene Edison , who verbally acknowledged these results. Review of the MIP images confirms the above findings. IMPRESSION: 1. Multiple bilateral acute pulmonary emboli, segmental to subsegmental size. 2. 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 There is complete obstruction of the lingular and left lower lobe airways. 1 cm pulmonary nodule in the left lateral costophrenic sulcus. Patient is scheduled for bronchoscopy. Electronically Signed   By: Monte Fantasia M.D.   On: 11/25/2016 10:09   Ct Abdomen Pelvis W Contrast  Result Date: 11/26/2016 CLINICAL DATA:  Lung mass and adenopathy EXAM: CT ABDOMEN AND PELVIS WITH CONTRAST TECHNIQUE: Multidetector CT imaging of the abdomen and pelvis was performed using the standard protocol following bolus administration of intravenous contrast. CONTRAST:  163m ISOVUE-300 IOPAMIDOL (ISOVUE-300) INJECTION 61% COMPARISON:  CT abdomen and pelvis January 23, 2016; CT angiogram chest November 25, 2016 FINDINGS: Lower chest: Consolidation in the left lower lobe with left pleural effusion stable from 1 day prior. Incomplete visualization of left hilar adenopathy, demonstrated 1 day prior. Hepatobiliary: There is fatty infiltration in the liver near the fissure  for the ligamentum teres. No liver mass lesion identified. There is questionable sludge in the gallbladder. The gallbladder wall is not appreciably thickened. There is no biliary duct dilatation. Pancreas: No pancreatic mass or inflammatory focus. Spleen: No splenic lesions are evident. There is a tiny accessory spleen inferior to the spleen. Adrenals/Urinary Tract: Adrenals appear normal bilaterally. There is a 1 x 1 cm cyst in the anterior lower pole right kidney. No hydronephrosis on either side. No renal or ureteral calculus on either side. Urinary bladder is midline with wall thickness within normal limits. Stomach/Bowel: There is no bowel wall or mesenteric thickening. No bowel obstruction. No free air or portal venous air. Vascular/Lymphatic: There is no abdominal aortic aneurysm. There is mild calcification in the aorta. Major mesenteric vessels appear patent. There is no evident adenopathy in the abdomen or pelvis. Reproductive: There are a few scattered prostatic calculi. Prostate and seminal vesicles are normal in size and contour. No pelvic mass evident. Other: Appendix appears normal. There is no ascites or abscess in the abdomen or pelvis. No omental lesions are evident. There is a small ventral hernia containing only fat. There is fat in each inguinal ring. Patient has had previous inguinal hernia repairs. Musculoskeletal: There is degenerative change in the lumbar spine, most notably at L5-S1. There is no evident blastic or lytic bone lesion. No intramuscular or abdominal wall lesion. IMPRESSION: Consolidation with  questionable associated mass left lower lobe. Small left pleural effusion. Incomplete visualization left hilar adenopathy. These findings are stable compared to 1 day prior. No mass or adenopathy in the abdomen or pelvis. A bowel obstruction. No abscess. Appendix appears unremarkable. No renal or ureteral calculus. No hydronephrosis. There are a few small prostatic calculi. Small ventral  hernia containing only fat. Questionable sludge in gallbladder.  No gallbladder wall thickening. Electronically Signed   By: Lowella Grip III M.D.   On: 11/26/2016 11:35      SIGNIFICANT EVENTS 10/20/16  Seen at Pulmonary for abnormal CT chest and cough x3 months 11/06/16  Planned FOB but suffered mechanical fall in the snow with rotator cuff tear 11/12/16  L shoulder arthroscopy with rotator cuff repair post fall 11/24/16  Follow up in pulmonary office, reports SOB, CTA ordered  ............ 11/25/16  Admit to [facility] with PE, increased size of left hilar mass 11/27/16>> Plan to D/C home on Lovenox with bronch in 2 weeks per Dr. Melvyn Novas.  STUDIES:  CTA Chest 2/14 >> multiple bilateral acute pulmonary emboli, segmental to subsegmental size, progressive L hilar mass / adenopathy consistent with bronchogenic carcinoma, complete obstruction of the lingular and LLL airways, 1 cm pulmonary nodule in the left lateral costophrenic sulcus   CT Abdomen/Pelvis 11/26/2016 IMPRESSION: Consolidation with questionable associated mass left lower lobe. Small left pleural effusion. Incomplete visualization left hilar adenopathy.  No mass or adenopathy in the abdomen or pelvis. No bowel obstruction. No abscess. Appendix appears unremarkable. No renal or ureteral calculus. No hydronephrosis. There are a few small prostatic calculi. Small ventral hernia containing only fat. Questionable sludge in gallbladder.  No gallbladder wall thickening.  Preliminary findings UE Doppler Study: the visualized veins of the left upper extremity appear negative for deep and superficial vein thrombosis.  ASSESSMENT / PLAN:  Left Hilar Lung Mass - increased Multiple Bilateral Acute Pulmonary Emboli  LLL Post-Obstructive PNA CT Abdomen negative for associated mass lesions UE Doppler preliminary reading negative for deep or superficial vein thrombus. Plan: Converted from Heparin gtt to Lovenox treatment dose  2/15/  pm. Teaching for self injection completed 2/15 Plan for  BID  lovenox and then see Dr Melvyn Novas for bronch bx in about 2 weeks Follow up with Dr. Melvyn Novas in 1 week as out patient to ensure doing well. Dopplers - preliminary reading negative May need echo as a follow up at some point Needs ambulatory saturation prior to DC  Treat 7 days ABX as  culture pending/neg for post obstruction clearly noted on CT chest Pt. Was updated of plans at bedside this morning If neg mobile clot, no hemoptysis, and no desat this am with ambulatory saturation plan should be  to D/C home today per primary team.  Magdalen Spatz, AGACNP-BC [place] Pager # [phone] 11/27/2016 8:22 AM   STAFF NOTE: Linwood Dibbles, MD FACP have personally reviewed patient's available data, including medical history, events of note, physical examination and test results as part of my evaluation. I have discussed with resident/NP and other care providers such as pharmacist, RN and RRT. In addition, I personally evaluated patient and elicited key findings of: awake, alert, no distress, no hemoptysis on lovenox since last night, off heparin, doppler legs neg, CT abdo negative lesions, now to ambulate and check pulse ox, if wnl, can dc home on lovenoix and we have made appt with Dr Melvyn Novas in 1 week, crucial for him, to follow up then and plan bronch bx ,  consider 7 days abx then dc, oral abx okay, will sign off   Lavon Paganini. Titus Mould, MD, [facility] Pgr: [facility] 11/27/2016 9:42 AM

## 2016-11-27 NOTE — Discharge Summary (Signed)
Calvin Wagner, is a 51 y.o. male  DOB [birth year]  MRN [id number].  Admission date:  11/25/2016  Admitting Physician  Waldemar Dickens, MD  Discharge Date:  11/27/2016   Primary MD  [name], MD  Recommendations for primary care physician for things to follow:  - Patient to follow with Pulm Dr Melvyn Novas regarding bronchoscopy   Admission Diagnosis  Lung nodule [R91.1] Hilar mass [R91.8] Bronchial obstruction [J98.09] Other acute pulmonary embolism without acute cor pulmonale (HCC) [I26.99]   Discharge Diagnosis  Lung nodule [R91.1] Hilar mass [R91.8] Bronchial obstruction [J98.09] Other acute pulmonary embolism without acute cor pulmonale (HCC) [I26.99]    Active Problems:   GERD   Asthmatic bronchitis , chronic ([place])   Cancer ([place])   Obstructive pneumonia   Acute pulmonary embolism ([place])      Past Medical History:  Diagnosis Date  . GERD 11/08/2009   Qualifier: Diagnosis of  By: Ronnald Ramp MD, Arvid Right.   . HYPERTENSION 11/08/2009   Qualifier: Diagnosis of  By: Ronnald Ramp MD, Arvid Right.   . Migraine 02/25/2012  . Pneumonia   . PONV (postoperative nausea and vomiting)   . Rotator cuff tear, left     Past Surgical History:  Procedure Laterality Date  . INGUINAL HERNIA REPAIR    . SHOULDER ARTHROSCOPY WITH ROTATOR CUFF REPAIR Left 11/12/2016   Procedure: SHOULDER ARTHROSCOPY WITH ROTATOR CUFF REPAIR AND SUBACROMIAL DECOMPRESSION;  Surgeon: Tania Ade, MD;  Location: [place];  Service: Orthopedics;  Laterality: Left;  SHOULDER ARTHROSCOPY WITH ROTATOR CUFF REPAIR AND SUBACROMIAL DECOMPRESSION  . TONSILLECTOMY    . TOTAL KNEE ARTHROPLASTY         History of present illness and  Hospital Course:     Kindly see H&P for history of present illness and admission details, please review complete Labs, Consult reports and Test reports for all details in brief  HPI  from the history and physical done  on the day of admission 11/25/2016 HPI: Calvin Wagner is a 51 y.o. male with medical history significant of GERD, hypertension, migraines, (acute cuff tear with recent repair. Patient presenting to [facility] for recent outpatient diagnoses of pulmonary embolism found on imaging study on the day of admission. Study had been ordered by his outpatient pulmonologist in preparation for a planned bronchoscopy for better evaluation of a lung nodule. Patient currently complaining of worsening shortness of breath. Endorses 2 month history of ongoing cough. Of note patient underwent orthopedic repair of his left rotator cuff after sustaining a tear during a fall. This was done on 11/12/2016. Denies any fevers, productive cough, nausea, vomiting, chest pain, palpitations, asymmetrical weakness, dizziness/vertigo.    ED Course: Objective findings outlined below. Pulmonology consult. Started on Unasyn, heparin.   Hospital Course   51 year old male, lifelong nonsmoker, PMH of GERD, HTN, migraine, seen by pulmonology on 10/20/16 for an abnormal CT of the chest and cough of 3 months, prior CT 09/18/16 demonstrated 7 x 10 mm LLL nodule that was unchanged from 06/08/16,  plans were for bronchoscopy on 11/06/16. However while awaiting FOB, patient sustained a mechanical fall and left rotator cuff injury for which he underwent left shoulder arthroscopy and rotator cuff repair on 11/12/16 and FOB had to be delayed. He followed up on 2/13 in the pulmonology office and reported worsening dyspnea, fatigue climbing stairs. 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 He was referred to [facility] for admission. Pulmonology consulted.  1. Acute pulmonary embolism: Multiple and bilateral. In the context of likely bronchogenic  carcinoma. Started initially on IV heparin infusion. Pulmonology follow up appreciated. Recommends transitioning to  therapeutic dose Lovenox , he does not have any hemoptysis (has not had any thus far) or hypoxia and then can be discharged home on oral Augmentin to complete total 7 days treatment as outpatient. Pulmonology arranged outpatient follow-up with Dr. Melvyn Novas for bronchoscopy and biopsy in 1 weeks. Bilateral lower extremity and left upper extremity venous Doppler negative for DVT. As per pulmonology, no echo indicated at this time.  2. Postobstructive pneumonia, LLL: Secondary to lung mass. Treated with IV Unasyn during hospital stay, will discharge him 7 days of Augmentin  3. Left hilar lung mass: Suspicious for bronchogenic carcinoma. Outpatient evaluation with bronchoscopy and biopsy in 1 weeks.  4. Left rotator cuff injury, status post repair 11/12/16: Continue sling and outpatient follow-up with orthopedics. 5. Essential hypertension: Controlled. Patient on telemetry, there was evidence of 3 dropped beats, it appears to be more second-degree Mobitz 2 AV block, for total of 3 beats, he denies any symptoms, will stop Bystolic, and will increase him to pain from 5-10 mg oral daily, patient instructed to follow with his PCP if any symptoms of dizziness, or lightheadedness 6. GERD: Continue Protonix.  Discharge Condition:  stable   Follow UP  Follow-up Information    Christinia Gully, MD Follow up on 12/04/2016.   Specialty:  Pulmonary Disease Why:  Appt at 9:15 am  Contact information: [address] [place] [address] [phone]        [name], MD Follow up in 2 week(s).   Specialty:  Family Medicine Contact information: [place] [address] [phone]             Discharge Instructions  and  Discharge Medications     Discharge Instructions    Discharge instructions    Complete by:  As directed    Follow with Primary MD  [name], MD in 7 days   Get CBC, CMP,  checked  by Primary MD next visit.    Activity: As tolerated with Full fall precautions use walker/cane & assistance as needed   Disposition Home    Diet: Regular diet   On your next visit with your primary care physician please Get Medicines reviewed and adjusted.   Please request your Prim.MD to go over all Hospital Tests and Procedure/Radiological results at the follow up, please get all Hospital records sent to your Prim MD by signing hospital release before you go home.   If you experience worsening of your admission symptoms, develop shortness of breath, life threatening emergency, suicidal or homicidal thoughts you must seek medical attention immediately by calling 911 or calling your MD immediately  if symptoms less severe.  You Must read complete instructions/literature along with all the possible adverse reactions/side effects for all the  Medicines you take and that have been prescribed to you. Take any new Medicines after you have completely understood and accpet all the possible adverse reactions/side effects.   Do not drive, operating heavy machinery, perform activities at heights, swimming or participation in water activities or provide baby sitting services if your were admitted for syncope or siezures until you have seen by Primary MD or a Neurologist and advised to do so again.  Do not drive when taking Pain medications.    Do not take more than prescribed Pain, Sleep and Anxiety Medications  Special Instructions: If you have smoked or chewed Tobacco  in the last 2 yrs please stop smoking, stop any regular Alcohol  and or any Recreational drug use.  Wear Seat belts while driving.   Please note  You were cared for by a hospitalist during your hospital stay. If you have any questions about your discharge medications or the care you received while you were in the hospital after you are discharged, you can call the unit  and asked to speak with the hospitalist on call if the hospitalist that took care of you is not available. Once you are discharged, your primary care physician will handle any further medical issues. Please note that NO REFILLS for any discharge medications will be authorized once you are discharged, as it is imperative that you return to your primary care physician (or establish a relationship with a primary care physician if you do not have one) for your aftercare needs so that they can reassess your need for medications and monitor your lab values.   Increase activity slowly    Complete by:  As directed      Allergies as of 11/27/2016      Reactions   No Known Allergies       Medication List    STOP taking these medications   aspirin EC 81 MG tablet   docusate sodium 100 MG capsule Commonly known as:  COLACE   famotidine 20 MG tablet Commonly known as:  PEPCID   nebivolol 10 MG tablet Commonly known as:  BYSTOLIC     TAKE these medications   acetaminophen 325 MG tablet Commonly known as:  TYLENOL Take 650 mg by mouth every 6 (six) hours as needed for mild pain.   amLODipine 10 MG tablet Commonly known as:  NORVASC Take 1 tablet (10 mg total) by mouth daily. What changed:  medication strength  how much to take   amoxicillin-clavulanate 875-125 MG tablet Commonly known as:  AUGMENTIN Take 1 tablet by mouth every 12 (twelve) hours.   budesonide-formoterol 80-4.5 MCG/ACT inhaler Commonly known as:  SYMBICORT Inhale 2 puffs into the lungs 2 (two) times daily.   enoxaparin 100 MG/ML injection Commonly known as:  LOVENOX Inject 1 mL (100 mg total) into the skin every 12 (twelve) hours.   guaiFENesin 600 MG 12 hr tablet Commonly known as:  MUCINEX Take 2 tablets (1,200 mg total) by mouth 2 (two) times daily.   multivitamin with minerals Tabs tablet Take 1 tablet by mouth daily.   oxyCODONE-acetaminophen 5-325 MG tablet Commonly known as:  ROXICET Take 1-2 tablets  by mouth every 4 (four) hours as needed for severe pain.   pantoprazole 40 MG tablet Commonly known as:  PROTONIX Take 1 tablet (40 mg total) by mouth daily. Take 30-60 min before first meal of the day         Diet and Activity recommendation: See Discharge Instructions above  Consults obtained -  pulmonary   Major procedures and Radiology Reports - PLEASE review detailed and final reports for all details, in brief -      Dg Chest 2 View  Result Date: 11/24/2016 CLINICAL DATA:  Cough and shortness of breath. EXAM: CHEST  2 VIEW COMPARISON:  Multiple prior chest x-rays and a chest CT from 09/18/2016 FINDINGS: Persistent left lower lobe process possibly a combination of atelectasis and infiltrate. The pleural effusion however has resolved. The right lung remains clear. The cardiac silhouette, mediastinal and hilar contours stable. The bony structures are intact. IMPRESSION: Resolving left pleural effusion but persistent atelectasis and or infiltrate. Electronically Signed   By: Marijo Sanes M.D.   On: 11/24/2016 10:17   Dg Chest 2 View  Result Date: 11/12/2016 CLINICAL DATA:  Preop exam. EXAM: CHEST  2 VIEW COMPARISON:  10/27/2016 FINDINGS: Persisting left lower lobe consolidation with small pleural effusion and volume loss. There was progressive hilar adenopathy with airway narrowing on 09/18/2016 chest CT. PET-CT or bronchoscopy is again recommended. Per the EMR, bronchoscopy is planned but has been delayed. Normal heart size. Mild upper thoracic scoliosis. IMPRESSION: Persisting left lower lobe atelectasis/consolidation with small effusion, likely postobstructive in this patient with adenopathy and airway narrowing by 09/18/2016 chest CT. PET-CT or bronchoscopy again recommended. Electronically Signed   By: Monte Fantasia M.D.   On: 11/12/2016 09:06   Ct Angio Chest W/cm &/or Wo Cm  Result Date: 11/25/2016 CLINICAL DATA:  Positive D-dimer. Recent rotator cuff surgery. Evaluate for  pulmonary embolism. EXAM: CT ANGIOGRAPHY CHEST WITH CONTRAST TECHNIQUE: Multidetector CT imaging of the chest was performed using the standard protocol during bolus administration of intravenous contrast. Multiplanar CT image reconstructions and MIPs were obtained to evaluate the vascular anatomy. CONTRAST:  80 cc Isovue 370 intravenous COMPARISON:  09/18/2016 FINDINGS: Cardiovascular: There are multiple bilateral segmental and subsegmental central filling defects consistent with acute pulmonary emboli. Most proximal clots is seen at the bifurcation of the right apical segmental pulmonary arteries. At least 6 separate foci of emboli are seen. RV to LV ratio is near 1, but there is no pulmonary artery dilatation or hepatic vein reflux in this ratio may be overestimated due to the angle of imaging. Mediastinum/Nodes: Progressive left hilar adenopathy/mass with lower lobe bronchial invasion/obstruction. The masslike area measures up to 4 cm. Lingular airways are also involved/obstructed; anticipate PET-CT. No contralateral adenopathy is noted. Lungs/Pleura: Postobstructive volume loss and opacification in the lingula and left lower lobe. 1 cm nodule in the lateral costophrenic sulcus on the left. Small left pleural effusion. Upper Abdomen: No acute finding or visible metastasis. Musculoskeletal: Scoliosis.  No acute or aggressive finding. Critical Value/emergent results were called by telephone at the time of interpretation on 11/25/2016 at 10:06 am to Dr. Rexene Edison , who verbally acknowledged these results. Review of the MIP images confirms the above findings. IMPRESSION: 1. Multiple bilateral acute pulmonary emboli, segmental to subsegmental size. 2. 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 There is complete obstruction of the lingular and left lower lobe airways. 1 cm pulmonary nodule in the left lateral costophrenic sulcus. Patient is scheduled for bronchoscopy.  Electronically Signed   By: Monte Fantasia M.D.   On: 11/25/2016 10:09   Ct Abdomen Pelvis W Contrast  Result Date: 11/26/2016 CLINICAL DATA:  Lung mass and adenopathy EXAM: CT ABDOMEN AND PELVIS WITH CONTRAST TECHNIQUE: Multidetector CT imaging of the abdomen and pelvis was performed using the standard protocol following bolus administration  of intravenous contrast. CONTRAST:  141m ISOVUE-300 IOPAMIDOL (ISOVUE-300) INJECTION 61% COMPARISON:  CT abdomen and pelvis January 23, 2016; CT angiogram chest November 25, 2016 FINDINGS: Lower chest: Consolidation in the left lower lobe with left pleural effusion stable from 1 day prior. Incomplete visualization of left hilar adenopathy, demonstrated 1 day prior. Hepatobiliary: There is fatty infiltration in the liver near the fissure for the ligamentum teres. No liver mass lesion identified. There is questionable sludge in the gallbladder. The gallbladder wall is not appreciably thickened. There is no biliary duct dilatation. Pancreas: No pancreatic mass or inflammatory focus. Spleen: No splenic lesions are evident. There is a tiny accessory spleen inferior to the spleen. Adrenals/Urinary Tract: Adrenals appear normal bilaterally. There is a 1 x 1 cm cyst in the anterior lower pole right kidney. No hydronephrosis on either side. No renal or ureteral calculus on either side. Urinary bladder is midline with wall thickness within normal limits. Stomach/Bowel: There is no bowel wall or mesenteric thickening. No bowel obstruction. No free air or portal venous air. Vascular/Lymphatic: There is no abdominal aortic aneurysm. There is mild calcification in the aorta. Major mesenteric vessels appear patent. There is no evident adenopathy in the abdomen or pelvis. Reproductive: There are a few scattered prostatic calculi. Prostate and seminal vesicles are normal in size and contour. No pelvic mass evident. Other: Appendix appears normal. There is no ascites or abscess in the  abdomen or pelvis. No omental lesions are evident. There is a small ventral hernia containing only fat. There is fat in each inguinal ring. Patient has had previous inguinal hernia repairs. Musculoskeletal: There is degenerative change in the lumbar spine, most notably at L5-S1. There is no evident blastic or lytic bone lesion. No intramuscular or abdominal wall lesion. IMPRESSION: Consolidation with questionable associated mass left lower lobe. Small left pleural effusion. Incomplete visualization left hilar adenopathy. These findings are stable compared to 1 day prior. No mass or adenopathy in the abdomen or pelvis. A bowel obstruction. No abscess. Appendix appears unremarkable. No renal or ureteral calculus. No hydronephrosis. There are a few small prostatic calculi. Small ventral hernia containing only fat. Questionable sludge in gallbladder.  No gallbladder wall thickening. Electronically Signed   By: WLowella GripIII M.D.   On: 11/26/2016 11:35    Micro Results    Recent Results (from the past 240 hour(s))  Culture, blood (routine x 2) Call MD if unable to obtain prior to antibiotics being given     Status: None (Preliminary result)   Collection Time: 11/25/16  4:50 PM  Result Value Ref Range Status   Specimen Description BLOOD RIGHT HAND  Final   Special Requests BOTTLES DRAWN AEROBIC ONLY [place] Final   Culture NO GROWTH 2 DAYS  Final   Report Status PENDING  Incomplete  Culture, blood (routine x 2) Call MD if unable to obtain prior to antibiotics being given     Status: None (Preliminary result)   Collection Time: 11/25/16  5:00 PM  Result Value Ref Range Status   Specimen Description BLOOD LEFT HAND  Final   Special Requests IN PEDIATRIC BOTTLE 3CC  Final   Culture NO GROWTH 2 DAYS  Final   Report Status PENDING  Incomplete       Today   Subjective:   MRasheen Bellstoday has no headache,no chest or abdominal pain, reports cough, no hemoptysis or shortness of breath, feels  much better wants to go home today.   Objective:   Blood  pressure 119/72, pulse 84, temperature 98.9 F (37.2 C), temperature source Oral, resp. rate 18, height '6\' 1"'$  (1.854 m), weight 101 kg (222 lb 10.6 oz), SpO2 98 %.   Intake/Output Summary (Last 24 hours) at 11/27/16 1418 Last data filed at 11/27/16 0900  Gross per 24 hour  Intake              354 ml  Output                0 ml  Net              354 ml    Exam Awake Alert, Oriented x 3, No new F.N deficits, Normal affect [place].AT,PERRAL Supple Neck,No JVD,  Symmetrical Chest wall movement, Good air movement bilaterally, CTAB RRR,No Gallops,Rubs or new Murmurs, No Parasternal Heave +ve B.Sounds, Abd Soft, Non tender, No rebound -guarding or rigidity. No Cyanosis, Clubbing or edema, No new Rash or bruise  Data Review   CBC w Diff:  Lab Results  Component Value Date   WBC 5.3 11/27/2016   HGB 12.7 (L) 11/27/2016   HCT 38.1 (L) 11/27/2016   PLT 274 11/27/2016   LYMPHOPCT 15 11/25/2016   MONOPCT 6 11/25/2016   EOSPCT 2 11/25/2016   BASOPCT 0 11/25/2016    CMP:  Lab Results  Component Value Date   NA 139 11/26/2016   NA 139 10/10/2015   K 3.6 11/26/2016   CL 103 11/26/2016   CO2 26 11/26/2016   BUN 8 11/26/2016   BUN 14 10/10/2015   CREATININE 1.01 11/26/2016   PROT 7.1 11/25/2016   PROT 7.3 10/10/2015   ALBUMIN 4.0 11/25/2016   ALBUMIN 4.5 10/10/2015   BILITOT 0.3 11/25/2016   BILITOT 0.4 10/10/2015   ALKPHOS 82 11/25/2016   AST 25 11/25/2016   ALT 22 11/25/2016  .   Total Time in preparing paper work, data evaluation and todays exam - 35 minutes  Tayna Smethurst M.D on 11/27/2016 at 2:18 PM  [facility]   Office  [phone]

## 2016-11-27 NOTE — Telephone Encounter (Signed)
Spoke with pt's wife, Baker Janus. Pt is being discharged from the hospital today. He has an upcoming appointment with MW on 12/04/16. She is wanting to know what MW thinks about when the pt could have his biopsy done. She is aware that MW is not available until Monday.  MW - please advise. Thanks.

## 2016-11-27 NOTE — Progress Notes (Signed)
Patient discharge teaching given, including activity, diet, follow-up appoints, and medications. Patient verbalized understanding of all discharge instructions. IV access was d/c'd. Vitals are stable. Skin is intact except as charted in most recent assessments. Pt to be escorted out by NT, to be driven home by family. 

## 2016-11-27 NOTE — Care Management Note (Signed)
Case Management Note  Patient Details  Name: LATRON RIBAS MRN: [id number] Date of Birth: 10-25-1965  Subjective/Objective:   Multiple Bilateral Acute Pulmonary Emboli                 Action/Plan: Discharge Planning: AVS reviewed: please see previous NCM notes.   Received call from pt's wife and copay is $215 for Lovenox. Spoke to [facility] pharmacist. Pt may have a deductible he may need to meet. Explained to pharmacist that pt will have to pick up enough supply until he can reach out to his health plan on Monday. No assistance plan to help with cost.   PCP [name] MD    Expected Discharge Date:  11/27/16               Expected Discharge Plan:  Home/Self Care  In-House Referral:  NA  Discharge planning Services  CM Consult, Medication Assistance  Post Acute Care Choice:  NA Choice offered to:  NA  DME Arranged:  N/A DME Agency:  NA  HH Arranged:  NA HH Agency:  NA  Status of Service:  Completed, signed off  If discussed at [place] of Stay Meetings, dates discussed:    Additional Comments:  Erenest Rasher, RN 11/27/2016, 5:24 PM

## 2016-11-27 NOTE — Progress Notes (Signed)
11/27/16 1220  Notified Dr. Waldron Labs of patient's arythmia. EKG done, will continue to monitor.

## 2016-11-27 NOTE — Progress Notes (Signed)
SATURATION QUALIFICATIONS: (This note is used to comply with regulatory documentation for home oxygen)  Patient Saturations on Room Air at Rest = 98%  Patient Saturations on Room Air while Ambulating = 95%  Patient Saturations on 0 Liters of oxygen while Ambulating = 95%  Please briefly explain why patient needs home oxygen:

## 2016-11-28 LAB — LEGIONELLA PNEUMOPHILA SEROGP 1 UR AG: L. pneumophila Serogp 1 Ur Ag: NEGATIVE

## 2016-11-30 LAB — CULTURE, BLOOD (ROUTINE X 2)
Culture: NO GROWTH
Culture: NO GROWTH

## 2016-11-30 MED ORDER — BUDESONIDE-FORMOTEROL FUMARATE 80-4.5 MCG/ACT IN AERO: 2.0000 | INHALATION_SPRAY | Freq: Two times a day (BID) | RESPIRATORY_TRACT | 5 refills | Status: DC

## 2016-11-30 NOTE — Telephone Encounter (Signed)
Patient's wife request for Jeremiyah to be seen sooner with Dr. Melvyn Novas. MW doesn't have anything available until 12/10/2016. I offered to schedule with TP. Wife request to hear from MW to schedule a sooner appointment. Patient's wife request for Irene to be seen today, tomorrow, or Wednesday.

## 2016-11-30 NOTE — Telephone Encounter (Signed)
Spoke with pt's wife, Baker Janus. OV has been moved up per MW. While on the phone, pt's wife requested a refill on Symbicort. This has been sent in. A coupon is also being placed up front for pick up by Magda Paganini. Nothing further was needed.

## 2016-11-30 NOTE — Telephone Encounter (Signed)
See if we can just add him on Wednesday the 21st

## 2016-11-30 NOTE — Telephone Encounter (Signed)
MW please advise. Pt has an appointment with you on 12/04/16.

## 2016-12-01 DIAGNOSIS — Z4789 Encounter for other orthopedic aftercare: Secondary | ICD-10-CM | POA: Diagnosis not present

## 2016-12-01 DIAGNOSIS — M25512 Pain in left shoulder: Secondary | ICD-10-CM | POA: Diagnosis not present

## 2016-12-01 DIAGNOSIS — M75112 Incomplete rotator cuff tear or rupture of left shoulder, not specified as traumatic: Secondary | ICD-10-CM | POA: Diagnosis not present

## 2016-12-02 ENCOUNTER — Encounter: Payer: Self-pay | Admitting: Internal Medicine

## 2016-12-02 ENCOUNTER — Ambulatory Visit (INDEPENDENT_AMBULATORY_CARE_PROVIDER_SITE_OTHER): Payer: 59 | Admitting: Internal Medicine

## 2016-12-02 VITALS — BP 140/82 | HR 110 | Ht 73.0 in | Wt 223.0 lb

## 2016-12-02 DIAGNOSIS — R05 Cough: Secondary | ICD-10-CM | POA: Diagnosis not present

## 2016-12-02 DIAGNOSIS — I2699 Other pulmonary embolism without acute cor pulmonale: Secondary | ICD-10-CM | POA: Diagnosis not present

## 2016-12-02 DIAGNOSIS — R918 Other nonspecific abnormal finding of lung field: Secondary | ICD-10-CM

## 2016-12-02 DIAGNOSIS — R059 Cough, unspecified: Secondary | ICD-10-CM

## 2016-12-02 DIAGNOSIS — J449 Chronic obstructive pulmonary disease, unspecified: Secondary | ICD-10-CM | POA: Diagnosis not present

## 2016-12-02 NOTE — Patient Instructions (Addendum)
Stop lovenox on 12/09/16 am dose only   Come to outpatient registration at [facility] (behind the ER) at 715 am Thursday  12/10/16 with nothing to eat or drink after midnight  Wednesday  night .for Bronchoscopy

## 2016-12-02 NOTE — Progress Notes (Signed)
Subjective:     Patient ID: Calvin Wagner, male   DOB: Apr 28, 1966    MRN: [id number]    Brief patient profile:  57  yowm never smoker works [employer] baseline = able to do yardwork/ treadmill  No resp issues at all but noted onset of cough around 2016  While on ACEi > [place] eval dx eosinophilic esophagitis never completely better then much worse since around sept/oct 2017  with worse cough/sob stopped doing treadmill referred to pulmonary clinic 10/20/2016 by Dr   Calvin Wagner with abn ct    History of Present Illness  10/20/2016 1st [facility] office visit/ Calvin Wagner   Chief Complaint  Patient presents with  . Pulmonary Consult    referred by Dr. Orland Wagner, CT showed 7x10 left lower lung nodule, dry annoying cough for more than 3 months, just [facility]  chronically ill then acute onset fever Oct 10 2016 worse cough/ rattling congestion but no mucus but not abx until 10/17/15 rx doxy by ER  Fever resolved but overall much better off lisinopril x 3 weeks. Short of breath with more than adls  Difficult sleeping due to cough/ no better on tessalon  rec Please remember to go to the lab  department downstairs for your tests - we will call you with the results when they are available.  Bystolic 10 mg  daily to add to your amlodipine Prednisone 10 mg take  4 each am x 2 days,   2 each am x 2 days,  1 each am x 2 days and stop  Pantoprazole (protonix) 40 mg   Take  30-60 min before first meal of the day and Pepcid (famotidine)  20 mg one @  bedtime until return to office - this is the best way to tell whether stomach acid is contributing to your problem.  GERD  Diet   Symbicort 80 Take 2 puffs first thing in am and then another 2 puffs about 12 hours later.  Take delsym two tsp every 12 hours and supplement if needed with  Tylenol #3 up to 2 every 4 hours to suppress the urge to cough. Swallowing water or using ice chips/non mint and menthol containing  candies (such as lifesavers or sugarless jolly ranchers) are also effective.  You should rest your voice and avoid activities that you know make you cough. Once you have eliminated the cough for 3 straight days try reducing the tylenol #3  first,  then the delsym as tolerated.   Please schedule a follow up office visit in  1  Week , sooner if needed  - bring all active medications in hand    10/27/2016  f/u ov/Calvin Wagner re: L hilar nodes/ L CAP/ refractory cough improved off acei and on symb 80 2bid /gerd rx  Chief Complaint  Patient presents with  . Follow-up    Coughing less but sputum is yellow.  He is also c/o minimal SOB and has some CP.    L anterolateral cp only with severe coughing fits otherwise all his complaints have improved Mucus was clear p completed doxy, now turing slt yellow in am's moslty but sleeping well now   rec Continue symbicort 80 Take 2 puffs first thing in am and then another 2 puffs about 12 hours later.  Work on maintaining perfect  inhaler technique:  Continue Pantoprazole (protonix) 40 mg   Take  30-60 min before first meal of the day and Pepcid (famotidine)  20 mg one @  bedtime until return to office - this is the best way to tell whether stomach acid is contributing to your problem.   Blood pressure meds are the same as per med list  GERD diet  Schedule FOB > scheduled for 11/06/16 canceled p pt fell shoveling snow   Nov 12 2016 L shoulder rotator cuff tear > surgery   Nov 25 2016 Lovenox rx bid     12/02/2016  f/u ov/Calvin Wagner re:  Cough /PE ? LLL obst  Chief Complaint  Patient presents with  . Follow-up    He is coughing less and producing minimal clear sputum in the am's.     No hemoptysis or cp/ sob and cough improving   No obvious day to day or daytime variability or assoc excess/ purulent sputum or mucus plugs or hemoptysis or cp or chest tightness, subjective wheeze or overt sinus or hb symptoms. No unusual exp hx or h/o childhood pna/ asthma or  knowledge of premature birth.  Sleeping ok without nocturnal  or early am exacerbation  of respiratory  c/o's or need for noct saba. Also denies any obvious fluctuation of symptoms with weather or environmental changes or other aggravating or alleviating factors except as outlined above   Current Medications, Allergies, Complete Past Medical History, Past Surgical History, Family History, and Social History were reviewed in [employer] record.  ROS  The following are not active complaints unless bolded sore throat, dysphagia, dental problems, itching, sneezing,  nasal congestion or excess/ purulent secretions, ear ache,   fever, chills, sweats, unintended wt loss, classically pleuritic or exertional cp,  orthopnea pnd or leg swelling, presyncope, palpitations, abdominal pain, anorexia, nausea, vomiting, diarrhea  or change in bowel or bladder habits, change in stools or urine, dysuria,hematuria,  rash, arthralgias, visual complaints, headache, numbness, weakness or ataxia or problems with walking or coordination,  change in mood/affect or memory.             Objective:   Physical Exam    amb wm nad  12/02/2016       223  10/27/2016       228  10/20/16 224 lb (101.6 kg)  10/16/16 230 lb (104.3 kg)  01/06/16 230 lb (104.3 kg)     Vital signs reviewed  - Note on arrival 02 sats  97% on RA     HEENT: nl dentition, turbinates, and oropharynx. Nl external ear canals without cough reflex   NECK :  without JVD/Nodes/TM/ nl carotid upstrokes bilaterally   LUNGS: no acc muscle use,  Nl contour chest with decreased bs on L but no longer any significant rhonchi on L   CV:  RRR  no s3 or murmur or increase in P2, nad no edema   ABD:  soft and nontender with nl inspiratory excursion in the supine position. No bruits or organomegaly appreciated, bowel sounds nl  MS:  Nl gait/ ext warm without deformities, calf tenderness, cyanosis or clubbing No obvious joint  restrictions   SKIN: warm and dry without lesions    NEURO:  alert, approp, nl sensorium with  no motor or cerebellar deficits apparent.       I personally reviewed images and agree with radiology impression as follows:  CTa  Chest  11/25/16  1. Multiple bilateral acute pulmonary emboli, segmental to subsegmental size. 2. 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 There is complete obstruction of the lingular and left lower lobe airways.  1 cm pulmonary nodule in the left lateral costophrenic sulcus. Patient is scheduled for bronchoscopy.        Assessment:

## 2016-12-03 DIAGNOSIS — M75112 Incomplete rotator cuff tear or rupture of left shoulder, not specified as traumatic: Secondary | ICD-10-CM | POA: Diagnosis not present

## 2016-12-03 DIAGNOSIS — M25512 Pain in left shoulder: Secondary | ICD-10-CM | POA: Diagnosis not present

## 2016-12-03 DIAGNOSIS — Z4789 Encounter for other orthopedic aftercare: Secondary | ICD-10-CM | POA: Diagnosis not present

## 2016-12-04 ENCOUNTER — Ambulatory Visit: Payer: 59 | Admitting: Internal Medicine

## 2016-12-06 ENCOUNTER — Encounter: Payer: Self-pay | Admitting: Internal Medicine

## 2016-12-06 NOTE — Assessment & Plan Note (Signed)
rx doxy 10/10/16 > fever resolved completed 7 days  - 10/20/2016   ESR 41 > pred x 6 days - CTa 11/25/16 c/w mass in LLL obst ? Carcinoid in never smoker > FOB planned for 12/10/16

## 2016-12-06 NOTE — Assessment & Plan Note (Signed)
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 12/10/16

## 2016-12-06 NOTE — Assessment & Plan Note (Signed)
Improving on symbicort 80 2 bid > continue

## 2016-12-06 NOTE — Assessment & Plan Note (Signed)
10/20/2016    > try symbicort 80 2bid  -   10/27/2016  After extensive coaching HFA effectiveness =    90% > continue symbicort  80 2bid

## 2016-12-08 DIAGNOSIS — M25512 Pain in left shoulder: Secondary | ICD-10-CM | POA: Diagnosis not present

## 2016-12-08 DIAGNOSIS — Z4789 Encounter for other orthopedic aftercare: Secondary | ICD-10-CM | POA: Diagnosis not present

## 2016-12-08 DIAGNOSIS — M75112 Incomplete rotator cuff tear or rupture of left shoulder, not specified as traumatic: Secondary | ICD-10-CM | POA: Diagnosis not present

## 2016-12-09 ENCOUNTER — Encounter (HOSPITAL_COMMUNITY): Payer: Self-pay

## 2016-12-10 ENCOUNTER — Encounter (HOSPITAL_COMMUNITY): Payer: Self-pay | Admitting: Respiratory Therapy

## 2016-12-10 ENCOUNTER — Ambulatory Visit (HOSPITAL_COMMUNITY)
Admission: RE | Admit: 2016-12-10 | Discharge: 2016-12-10 | Disposition: A | Discharge status: Home / Self Care | Payer: 59 | Source: Ambulatory Visit | Attending: Internal Medicine | Admitting: Internal Medicine

## 2016-12-10 ENCOUNTER — Encounter (HOSPITAL_COMMUNITY)
Admission: RE | Disposition: A | Discharge status: Home / Self Care | Payer: Self-pay | Source: Ambulatory Visit | Attending: Internal Medicine

## 2016-12-10 ENCOUNTER — Telehealth: Payer: Self-pay | Admitting: Internal Medicine

## 2016-12-10 DIAGNOSIS — Z7951 Long term (current) use of inhaled steroids: Secondary | ICD-10-CM | POA: Insufficient documentation

## 2016-12-10 DIAGNOSIS — K219 Gastro-esophageal reflux disease without esophagitis: Secondary | ICD-10-CM | POA: Diagnosis not present

## 2016-12-10 DIAGNOSIS — I2699 Other pulmonary embolism without acute cor pulmonale: Secondary | ICD-10-CM | POA: Insufficient documentation

## 2016-12-10 DIAGNOSIS — R918 Other nonspecific abnormal finding of lung field: Secondary | ICD-10-CM | POA: Diagnosis present

## 2016-12-10 DIAGNOSIS — J45909 Unspecified asthma, uncomplicated: Secondary | ICD-10-CM | POA: Insufficient documentation

## 2016-12-10 DIAGNOSIS — Z79899 Other long term (current) drug therapy: Secondary | ICD-10-CM | POA: Diagnosis present

## 2016-12-10 DIAGNOSIS — C7A1 Malignant poorly differentiated neuroendocrine tumors: Secondary | ICD-10-CM | POA: Diagnosis not present

## 2016-12-10 HISTORY — PX: VIDEO BRONCHOSCOPY: SHX5072

## 2016-12-10 SURGERY — VIDEO BRONCHOSCOPY WITHOUT FLUORO
Anesthesia: Moderate Sedation | Laterality: Bilateral

## 2016-12-10 MED ORDER — MIDAZOLAM HCL 10 MG/2ML IJ SOLN
INTRAMUSCULAR | Status: DC | PRN
Start: 2016-12-10 — End: 2016-12-10
  Administered 2016-12-10 (×2): 2.5 mg via INTRAVENOUS

## 2016-12-10 MED ORDER — SODIUM CHLORIDE 0.9 % IV SOLN
INTRAVENOUS | Status: DC
  Administered 2016-12-10: 08:00:00 via INTRAVENOUS

## 2016-12-10 MED ORDER — LIDOCAINE HCL 2 % EX GEL: 1.0000 "application " | Freq: Once | CUTANEOUS | Status: DC

## 2016-12-10 MED ORDER — MEPERIDINE HCL 100 MG/ML IJ SOLN
INTRAMUSCULAR | Status: AC
  Filled 2016-12-10: qty 2

## 2016-12-10 MED ORDER — MIDAZOLAM HCL 5 MG/ML IJ SOLN
INTRAMUSCULAR | Status: AC
  Filled 2016-12-10: qty 2

## 2016-12-10 MED ORDER — MEPERIDINE HCL 25 MG/ML IJ SOLN
INTRAMUSCULAR | Status: DC | PRN
  Administered 2016-12-10 (×2): 50 mg via INTRAVENOUS

## 2016-12-10 MED ORDER — PHENYLEPHRINE HCL 0.25 % NA SOLN
NASAL | Status: DC | PRN
  Administered 2016-12-10: 2 via NASAL

## 2016-12-10 MED ORDER — LIDOCAINE HCL 2 % EX GEL
CUTANEOUS | Status: DC | PRN
  Administered 2016-12-10: 1

## 2016-12-10 MED ORDER — LIDOCAINE HCL 1 % IJ SOLN
INTRAMUSCULAR | Status: DC | PRN
  Administered 2016-12-10: 6 mL via RESPIRATORY_TRACT

## 2016-12-10 MED ORDER — PHENYLEPHRINE HCL 0.25 % NA SOLN: 1.0000 | Freq: Four times a day (QID) | NASAL | Status: DC | PRN

## 2016-12-10 MED ORDER — MIDAZOLAM HCL 5 MG/ML IJ SOLN: 1.0000 mg | Freq: Once | INTRAMUSCULAR | Status: DC

## 2016-12-10 MED ORDER — ACETAMINOPHEN-CODEINE #4 300-60 MG PO TABS: 1.0000 | ORAL_TABLET | ORAL | 0 refills | Status: DC | PRN

## 2016-12-10 MED ORDER — MEPERIDINE HCL 100 MG/ML IJ SOLN: 100.0000 mg | Freq: Once | INTRAMUSCULAR | Status: DC

## 2016-12-10 MED ORDER — EPINEPHRINE PF 1 MG/10ML IJ SOSY
PREFILLED_SYRINGE | INTRAMUSCULAR | Status: DC | PRN
  Administered 2016-12-10: 1 via ENDOTRACHEOPULMONARY

## 2016-12-10 NOTE — Telephone Encounter (Signed)
Rx has been called in per MW. Pt's wife is aware. Nothing further was needed.

## 2016-12-10 NOTE — Discharge Instructions (Signed)
Flexible Bronchoscopy, Care After These instructions give you information on caring for yourself after your procedure. Your doctor may also give you more specific instructions. Call your doctor if you have any problems or questions after your procedure. Follow these instructions at home:  Do not eat or drink anything for 2 hours after your procedure. If you try to eat or drink before the medicine wears off, food or drink could go into your lungs. You could also burn yourself.  After 2 hours have passed and when you can cough and gag normally, you may eat soft food and drink liquids slowly.  Don't take lovenox until bedtime and hold if any active bleeding  The day after the test, you may eat your normal diet.  You may do your normal activities.  Keep all doctor visits. Get help right away if:  You get more and more short of breath.  You get light-headed.  You feel like you are going to pass out (faint).  You have chest pain.  You have new problems that worry you.  You cough up more than a little blood.  You cough up more blood than before. This information is not intended to replace advice given to you by your health care provider. Make sure you discuss any questions you have with your health care provider. Document Released: 07/26/2009 Document Revised: 03/05/2016 Document Reviewed: 06/02/2013 Elsevier Interactive Patient Education  2017 [place] not eat or drink until after 1030 today 12/10/16

## 2016-12-10 NOTE — Telephone Encounter (Signed)
Yes we need to get him Tyl #4 one q 4h prn #100 no refills

## 2016-12-10 NOTE — Op Note (Signed)
Bronchoscopy Procedure Note  Date of Operation: 12/10/2016   Pre-op Diagnosis: Lung mass  Post-op Diagnosis: same  Surgeon: Christinia Gully  Anesthesia: Monitored Local Anesthesia with Sedation Time Started: 0817 total of versed 5 mg IV/ Demerol 100 mg IV  Time Stopped:  0045   Operation: Video Flexible fiberoptic bronchoscopy, diagnostic   Findings: smooth lobulated vascular mass 75% obst LMSB at secondary carina  Specimen: Endobronchial bx x 4  Estimated Blood Loss: min  Complications: none/ pretreated with Epi x one vial   Indications and History: See updated H and P same date. The risks, benefits, complications, treatment options and expected outcomes were discussed with the patient.  The possibilities of reaction to medication, pulmonary aspiration, perforation of a viscus, bleeding, failure to diagnose a condition and creating a complication requiring transfusion or operation were discussed with the patient who freely signed the consent.    Description of Procedure: The patient was re-examined in the bronchoscopy suite and the site of surgery properly noted/marked.  The patient was identified  and the procedure verified as Flexible Fiberoptic Bronchoscopy.  A Time Out was held and the above information confirmed.   After the induction of topical nasopharyngeal anesthesia, the patient was positioned  and the bronchoscope was passed through the R naris. The vocal cords were visualized and  1% buffered lidocaine 5 ml was topically placed onto the cords. The cords were nl. The scope was then passed into the trachea.  1% buffered lidocaine given topically. Airways inspected bilaterally to the subsegmental level with the following findings:  All airways nl on R to subsegmental level and carina sharp\ smooth lobulated vascular mass 75% obst LMSB at secondary carina   Interventions:  Epi x one vial pre treatment Endobronchial bx x 4      The Patient was taken to the Endoscopy  Recovery area in satisfactory condition.  Attestation: I performed the procedure.  Christinia Gully, MD Pulmonary and [place] [phone] After 5:30 PM or weekends, call [phone]

## 2016-12-10 NOTE — Telephone Encounter (Signed)
Pt's wife was under the impression that MW was going to call in Tylenol #4. This was not taken care of.  MW - please advise. Thanks.

## 2016-12-10 NOTE — H&P (Signed)
Brief patient profile:  53  yowm never smoker works [name] baseline = able to do yardwork/ treadmill  No resp issues at all but noted onset of cough around 2016  While on ACEi > [place] eval dx eosinophilic esophagitis never completely better then much worse since around sept/oct 2017  with worse cough/sob stopped doing treadmill referred to pulmonary clinic 10/20/2016 by Dr   Orland Mustard with abn ct    History of Present Illness  10/20/2016 1st [facility] office visit/ Juanluis Guastella       Chief Complaint  Patient presents with  . Pulmonary Consult    referred by Dr. Orland Mustard, CT showed 7x10 left lower lung nodule, dry annoying cough for more than 3 months, [facility]  chronically ill then acute onset fever Oct 10 2016 worse cough/ rattling congestion but no mucus but not abx until 10/17/15 rx doxy by ER  Fever resolved but overall much better off lisinopril x 3 weeks. Short of breath with more than adls  Difficult sleeping due to cough/ no better on tessalon  rec Please remember to go to the lab  department downstairs for your tests - we will call you with the results when they are available.  Bystolic 10 mg  daily to add to your amlodipine Prednisone 10 mg take  4 each am x 2 days,   2 each am x 2 days,  1 each am x 2 days and stop  Pantoprazole (protonix) 40 mg   Take  30-60 min before first meal of the day and Pepcid (famotidine)  20 mg one @  bedtime until return to office - this is the best way to tell whether stomach acid is contributing to your problem.  GERD  Diet   Symbicort 80 Take 2 puffs first thing in am and then another 2 puffs about 12 hours later.  Take delsym two tsp every 12 hours and supplement if needed with  Tylenol #3 up to 2 every 4 hours to suppress the urge to cough. Swallowing water or using ice chips/non mint and menthol containing candies (such as lifesavers or sugarless jolly ranchers) are also effective.  You  should rest your voice and avoid activities that you know make you cough. Once you have eliminated the cough for 3 straight days try reducing the tylenol #3  first,  then the delsym as tolerated.   Please schedule a follow up office visit in  1  Week , sooner if needed  - bring all active medications in hand    10/27/2016  f/u ov/Renea Schoonmaker re: L hilar nodes/ L CAP/ refractory cough improved off acei and on symb 80 2bid /gerd rx      Chief Complaint  Patient presents with  . Follow-up    Coughing less but sputum is yellow.  He is also c/o minimal SOB and has some CP.    L anterolateral cp only with severe coughing fits otherwise all his complaints have improved Mucus was clear p completed doxy, now turing slt yellow in am's moslty but sleeping well now   rec Continue symbicort 80 Take 2 puffs first thing in am and then another 2 puffs about 12 hours later.  Work on maintaining perfect  inhaler technique:  Continue Pantoprazole (protonix) 40 mg   Take  30-60 min before first meal of the day and Pepcid (famotidine)  20 mg one @  bedtime until return to office - this is the  best way to tell whether stomach acid is contributing to your problem.   Blood pressure meds are the same as per med list  GERD diet  Schedule FOB > scheduled for 11/06/16 canceled p pt fell shoveling snow   Nov 12 2016 L shoulder rotator cuff tear > surgery   Nov 25 2016 Lovenox rx bid     12/02/2016  f/u ov/Shaneisha Burkel re:  Cough /PE ? LLL obst      Chief Complaint  Patient presents with  . Follow-up    He is coughing less and producing minimal clear sputum in the am's.     No hemoptysis or cp/ sob and cough improving   No obvious day to day or daytime variability or assoc excess/ purulent sputum or mucus plugs or hemoptysis or cp or chest tightness, subjective wheeze or overt sinus or hb symptoms. No unusual exp hx or h/o childhood pna/ asthma or knowledge of premature birth.  Sleeping ok without nocturnal   or early am exacerbation  of respiratory  c/o's or need for noct saba. Also denies any obvious fluctuation of symptoms with weather or environmental changes or other aggravating or alleviating factors except as outlined above   Current Medications, Allergies, Complete Past Medical History, Past Surgical History, Family History, and Social History were reviewed in [employer] record.  ROS  The following are not active complaints unless bolded sore throat, dysphagia, dental problems, itching, sneezing,  nasal congestion or excess/ purulent secretions, ear ache,   fever, chills, sweats, unintended wt loss, classically pleuritic or exertional cp,  orthopnea pnd or leg swelling, presyncope, palpitations, abdominal pain, anorexia, nausea, vomiting, diarrhea  or change in bowel or bladder habits, change in stools or urine, dysuria,hematuria,  rash, arthralgias, visual complaints, headache, numbness, weakness or ataxia or problems with walking or coordination,  change in mood/affect or memory.             Objective:   Physical Exam    amb wm nad  12/02/2016       223     10/27/2016       228  10/20/16 224 lb (101.6 kg)  10/16/16 230 lb (104.3 kg)  01/06/16 230 lb (104.3 kg)     Vital signs reviewed  - Note on arrival 02 sats  97% on RA     HEENT: nl dentition, turbinates, and oropharynx. Nl external ear canals without cough reflex   NECK :  without JVD/Nodes/TM/ nl carotid upstrokes bilaterally   LUNGS: no acc muscle use,  Nl contour chest with decreased bs on L but no longer any significant rhonchi on L   CV:  RRR  no s3 or murmur or increase in P2, nad no edema   ABD:  soft and nontender with nl inspiratory excursion in the supine position. No bruits or organomegaly appreciated, bowel sounds nl  MS:  Nl gait/ ext warm without deformities, calf tenderness, cyanosis or clubbing No obvious joint restrictions   SKIN: warm and dry without lesions     NEURO:  alert, approp, nl sensorium with  no motor or cerebellar deficits apparent.       I personally reviewed images and agree with radiology impression as follows:  CTa  Chest  11/25/16  1. Multiple bilateral acute pulmonary emboli, segmental to subsegmental size. 2. 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 There is complete obstruction of the lingular and left lower lobe airways. 1 cm pulmonary nodule in the left  lateral costophrenic sulcus. Patient is scheduled for bronchoscopy.        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 12/10/16    Assessment & Plan Note by Tanda Rockers, MD at 12/06/2016 5:41 PM   Author: Tanda Rockers, MD Author Type: Physician Filed: 12/06/2016 5:41 PM  Note Status: Written Cosign: Cosign Not Required Encounter Date: 12/06/2016 5:41 PM  Problem: Asthmatic bronchitis , chronic ([place])  Editor: Tanda Rockers, MD (Physician)    10/20/2016    > try symbicort 80 2bid  -   10/27/2016  After extensive coaching HFA effectiveness =    90% > continue symbicort  80 2bid       Assessment & Plan Note by Tanda Rockers, MD at 12/06/2016 5:40 PM   Author: Tanda Rockers, MD Author Type: Physician Filed: 12/06/2016 5:40 PM   Note Status: Written Cosign: Cosign Not Required Encounter Date: 12/06/2016 5:40 PM  Problem: Pulmonary infiltrates on CXR  Editor: Tanda Rockers, MD (Physician)    rx doxy 10/10/16 > fever resolved completed 7 days  - 10/20/2016   ESR 41 > pred x 6 days - CTa 11/25/16 c/w mass in LLL obst ? Carcinoid in never smoker > FOB planned for 12/10/16    Assessment & Plan Note by Tanda Rockers, MD at 12/06/2016 5:39 PM   Author: Tanda Rockers, MD Author Type: Physician Filed: 12/06/2016 5:39 PM  Note Status: Written Cosign: Cosign Not Required Encounter Date: 12/06/2016 5:39 PM  Problem: Cough  Editor: Tanda Rockers, MD (Physician)    Improving on symbicort 80 2 bid > continue     Patient Instructions by Tanda Rockers, MD at 12/02/2016 11:30 AM   Author: Tanda Rockers, MD Author Type: Physician Filed: 12/02/2016 11:49 AM  Note Status: Addendum Cosign: Cosign Not Required Encounter Date: 12/02/2016 11:30 AM  Editor: Tanda Rockers, MD (Physician)  Prior Versions: 1. Tanda Rockers, MD (Physician) at 12/02/2016 11:47 AM - Signed    Stop lovenox on 12/09/16 am dose only   Come to outpatient registration at [facility]  (behind the ER) at 715 am Thursday  12/10/16 with nothing to eat or drink after midnight  Wednesday  night .for Bronchoscopy           12/10/2016  Day of FOB:  No change in hx or exam and pt wishes to proceed/ last dose of lovenox am 12/09/16    Christinia Gully, MD Pulmonary and [place] Cell [phone] After 5:30 PM or weekends, use [facility] [phone]

## 2016-12-10 NOTE — Progress Notes (Signed)
Video bronchoscopy performed Intervention bronchial biopsies Pt tolerated well  Demarie Hyneman David RRT  

## 2016-12-14 ENCOUNTER — Encounter (HOSPITAL_COMMUNITY): Payer: Self-pay | Admitting: Internal Medicine

## 2016-12-14 ENCOUNTER — Other Ambulatory Visit: Payer: Self-pay | Admitting: Internal Medicine

## 2016-12-14 ENCOUNTER — Telehealth: Payer: Self-pay | Admitting: *Deleted

## 2016-12-14 ENCOUNTER — Telehealth: Payer: Self-pay | Admitting: Internal Medicine

## 2016-12-14 DIAGNOSIS — R918 Other nonspecific abnormal finding of lung field: Secondary | ICD-10-CM

## 2016-12-14 DIAGNOSIS — M75112 Incomplete rotator cuff tear or rupture of left shoulder, not specified as traumatic: Secondary | ICD-10-CM | POA: Diagnosis not present

## 2016-12-14 DIAGNOSIS — C801 Malignant (primary) neoplasm, unspecified: Secondary | ICD-10-CM

## 2016-12-14 DIAGNOSIS — M25512 Pain in left shoulder: Secondary | ICD-10-CM | POA: Diagnosis not present

## 2016-12-14 DIAGNOSIS — Z4789 Encounter for other orthopedic aftercare: Secondary | ICD-10-CM | POA: Diagnosis not present

## 2016-12-14 MED ORDER — AMLODIPINE BESYLATE 10 MG PO TABS: 10.0000 mg | ORAL_TABLET | Freq: Every day | ORAL | 0 refills | Status: DC

## 2016-12-14 NOTE — Telephone Encounter (Signed)
Oncology Nurse Navigator Documentation  Oncology Nurse Navigator Flowsheets 12/14/2016  Navigator Location [facility]  Referral date to RadOnc/MedOnc 12/14/2016  Navigator Encounter Type Telephone/I received referral on Calvin Wagner today.  I called and gave him an appt for [place] next week.  He verbalized understanding of appt time and place.   Telephone Outgoing Call  Treatment Phase Pre-Tx/Tx Discussion  Barriers/Navigation Needs Coordination of Care  Interventions Coordination of Care  Coordination of Care Appts  Acuity Level 1  Acuity Level 1 Initial guidance, education and coordination as needed  Time Spent with Patient 30

## 2016-12-14 NOTE — Telephone Encounter (Signed)
Ok to refill x 3 months at 10 mg daily but before runs out of this needs to go back to PCP for f/u and further refills

## 2016-12-14 NOTE — Telephone Encounter (Signed)
Spoke with pt's spouse, who states while admitted on 11-25-16. Pt was instructed to stop bystolic and double norvasc (start taken '10mg'$ ). Due to doubling medication, pt has ran out of medication and is in need of refills but is unsure of what provider will be following up on this meication. Pt's spouse has reached out to PCP but has not gotten an response yet.  MW please advise. Thanks.

## 2016-12-14 NOTE — Telephone Encounter (Signed)
lmtcb x1 for pt's wife, [place] Janus.

## 2016-12-14 NOTE — Telephone Encounter (Signed)
Pt's spouse is aware of MW's below message and voiced her understanding. Baker Janus request that we send one refill to [facility] and 2 refills mail order. I advised pt that it is typically cheaper to do a 90 day supply with 3 refills through mail order. I have suggested that we send a 1 month supply to [facility], and for Gail to reach out to pt's PCP to send Rx to [facility].  Rx sent to preferred pharmacy.  B[place] Janus voiced her understanding and had no further questions. Nothing further needed

## 2016-12-14 NOTE — Telephone Encounter (Signed)
lmtcb x1 for pt's wife. Will await call back.

## 2016-12-15 ENCOUNTER — Telehealth: Payer: Self-pay | Admitting: *Deleted

## 2016-12-15 ENCOUNTER — Telehealth: Payer: Self-pay | Admitting: Internal Medicine

## 2016-12-15 NOTE — Telephone Encounter (Signed)
Spoke to pt's wife & gave her phone # to [facility] so she can reschedule PET scan.  She also wanted to reschedule appt with Dr Inda Merlin.  I gave her phone # for [place] as well.  Nothing further needed.

## 2016-12-15 NOTE — Telephone Encounter (Signed)
Oncology Nurse Navigator Documentation  Oncology Nurse Navigator Flowsheets 12/15/2016  Navigator Location [facility]  Navigator Encounter Type Telephone/patient's wife called. She states she is unable to make appt with her husband.  She asked that it be changed so she can come.  The next available with Dr. Julien Nordmann is 12/31/16.  I asked if she would like to be seen with someone else earlier.  She stated no that Dr. Melvyn Novas suggested Dr. Julien Nordmann to see patient.  I gave her another time to be seen with Dr. Julien Nordmann at his first available.   Telephone Incoming Call  Treatment Phase Pre-Tx/Tx Discussion  Barriers/Navigation Needs Coordination of Care  Interventions Coordination of Care  Coordination of Care Appts  Acuity Level 1  Acuity Level 1 -  Time Spent with Patient 15

## 2016-12-15 NOTE — Telephone Encounter (Signed)
Spoke with pt's wife, Baker Janus. She is needing have pt's PET scan rescheduled. Message will be routed to [facility].

## 2016-12-17 ENCOUNTER — Encounter
Admission: RE | Admit: 2016-12-17 | Discharge: 2016-12-17 | Disposition: A | Discharge status: Home / Self Care | Payer: 59 | Source: Ambulatory Visit | Attending: Internal Medicine | Admitting: Internal Medicine

## 2016-12-17 DIAGNOSIS — R911 Solitary pulmonary nodule: Secondary | ICD-10-CM | POA: Diagnosis not present

## 2016-12-17 DIAGNOSIS — R918 Other nonspecific abnormal finding of lung field: Secondary | ICD-10-CM | POA: Insufficient documentation

## 2016-12-17 LAB — GLUCOSE, CAPILLARY: Glucose-Capillary: 99 mg/dL (ref 65–99)

## 2016-12-17 MED ORDER — FLUDEOXYGLUCOSE F - 18 (FDG) INJECTION
13.0800 | Freq: Once | INTRAVENOUS | Status: AC | PRN
  Administered 2016-12-17: 13.08 via INTRAVENOUS

## 2016-12-18 DIAGNOSIS — Z4789 Encounter for other orthopedic aftercare: Secondary | ICD-10-CM | POA: Diagnosis not present

## 2016-12-18 DIAGNOSIS — M25512 Pain in left shoulder: Secondary | ICD-10-CM | POA: Diagnosis not present

## 2016-12-18 DIAGNOSIS — M75112 Incomplete rotator cuff tear or rupture of left shoulder, not specified as traumatic: Secondary | ICD-10-CM | POA: Diagnosis not present

## 2016-12-22 ENCOUNTER — Encounter (HOSPITAL_COMMUNITY): Payer: 59

## 2016-12-24 ENCOUNTER — Ambulatory Visit: Payer: 59 | Admitting: Internal Medicine

## 2016-12-24 ENCOUNTER — Other Ambulatory Visit: Payer: 59

## 2016-12-24 DIAGNOSIS — M25512 Pain in left shoulder: Secondary | ICD-10-CM | POA: Diagnosis not present

## 2016-12-24 DIAGNOSIS — M75112 Incomplete rotator cuff tear or rupture of left shoulder, not specified as traumatic: Secondary | ICD-10-CM | POA: Diagnosis not present

## 2016-12-24 DIAGNOSIS — Z4789 Encounter for other orthopedic aftercare: Secondary | ICD-10-CM | POA: Diagnosis not present

## 2016-12-25 ENCOUNTER — Encounter: Payer: Self-pay | Admitting: *Deleted

## 2016-12-25 NOTE — Progress Notes (Signed)
Oncology Nurse Navigator Documentation  Oncology Nurse Navigator Flowsheets 12/25/2016  Navigator Location [facility]  Navigator Encounter Type Letter/Fax/Email/MTOC letter and map of [place] [facility] put in out going mail today.   Treatment Phase Pre-Tx/Tx Discussion  Barriers/Navigation Needs Education  Education Other  Interventions Education  Education Method Written  Acuity Level 2  Time Spent with Patient 15

## 2016-12-28 ENCOUNTER — Telehealth: Payer: Self-pay | Admitting: Internal Medicine

## 2016-12-28 MED ORDER — ENOXAPARIN SODIUM 100 MG/ML ~~LOC~~ SOLN: 100.0000 mg | Freq: Two times a day (BID) | SUBCUTANEOUS | 0 refills | Status: DC

## 2016-12-28 NOTE — Telephone Encounter (Signed)
Ok

## 2016-12-28 NOTE — Telephone Encounter (Signed)
Wife calling back about the noxaparin he needs to today

## 2016-12-28 NOTE — Telephone Encounter (Signed)
Spoke with pt's wife, Baker Janus. They are needing a refill on Lovenox. She is aware that we needed to check with MW before refilling.  MW - please advise. Thanks.

## 2016-12-28 NOTE — Telephone Encounter (Signed)
Patient wife called back - she can be reached at [phone] -pr

## 2016-12-28 NOTE — Telephone Encounter (Signed)
Pt is needing a refill on Lovenox. This was last refilled by another MD.  MW - is this okay to refill? Thanks.

## 2016-12-28 NOTE — Telephone Encounter (Signed)
Attempted to contact wife but mailbox was full

## 2016-12-28 NOTE — Telephone Encounter (Signed)
Rx for lovenox sent to [facility] [address]  Left msg to make [facility] aware

## 2016-12-29 DIAGNOSIS — M75112 Incomplete rotator cuff tear or rupture of left shoulder, not specified as traumatic: Secondary | ICD-10-CM | POA: Diagnosis not present

## 2016-12-29 DIAGNOSIS — Z4789 Encounter for other orthopedic aftercare: Secondary | ICD-10-CM | POA: Diagnosis not present

## 2016-12-29 DIAGNOSIS — M25512 Pain in left shoulder: Secondary | ICD-10-CM | POA: Diagnosis not present

## 2016-12-31 ENCOUNTER — Ambulatory Visit: Payer: 59 | Attending: Internal Medicine | Admitting: Physical Therapy

## 2016-12-31 ENCOUNTER — Other Ambulatory Visit (HOSPITAL_BASED_OUTPATIENT_CLINIC_OR_DEPARTMENT_OTHER): Payer: 59

## 2016-12-31 ENCOUNTER — Ambulatory Visit (HOSPITAL_BASED_OUTPATIENT_CLINIC_OR_DEPARTMENT_OTHER): Payer: 59 | Admitting: Internal Medicine

## 2016-12-31 ENCOUNTER — Other Ambulatory Visit: Payer: Self-pay | Admitting: Medical Oncology

## 2016-12-31 ENCOUNTER — Encounter: Payer: Self-pay | Admitting: Internal Medicine

## 2016-12-31 ENCOUNTER — Telehealth: Payer: Self-pay | Admitting: Internal Medicine

## 2016-12-31 ENCOUNTER — Encounter: Payer: Self-pay | Admitting: *Deleted

## 2016-12-31 ENCOUNTER — Ambulatory Visit (HOSPITAL_BASED_OUTPATIENT_CLINIC_OR_DEPARTMENT_OTHER): Payer: 59

## 2016-12-31 ENCOUNTER — Ambulatory Visit
Admission: RE | Admit: 2016-12-31 | Discharge: 2016-12-31 | Disposition: A | Discharge status: Home / Self Care | Payer: 59 | Source: Ambulatory Visit | Attending: Radiation Oncology | Admitting: Radiation Oncology

## 2016-12-31 VITALS — BP 150/99 | HR 99 | Temp 99.1°F | Resp 18 | Ht 73.0 in | Wt 224.7 lb

## 2016-12-31 DIAGNOSIS — C3492 Malignant neoplasm of unspecified part of left bronchus or lung: Secondary | ICD-10-CM

## 2016-12-31 DIAGNOSIS — Z5111 Encounter for antineoplastic chemotherapy: Secondary | ICD-10-CM

## 2016-12-31 DIAGNOSIS — Z9181 History of falling: Secondary | ICD-10-CM | POA: Diagnosis not present

## 2016-12-31 DIAGNOSIS — Z4789 Encounter for other orthopedic aftercare: Secondary | ICD-10-CM | POA: Diagnosis not present

## 2016-12-31 DIAGNOSIS — R932 Abnormal findings on diagnostic imaging of liver and biliary tract: Secondary | ICD-10-CM

## 2016-12-31 DIAGNOSIS — C7A1 Malignant poorly differentiated neuroendocrine tumors: Secondary | ICD-10-CM

## 2016-12-31 DIAGNOSIS — M75112 Incomplete rotator cuff tear or rupture of left shoulder, not specified as traumatic: Secondary | ICD-10-CM | POA: Diagnosis not present

## 2016-12-31 DIAGNOSIS — Z7189 Other specified counseling: Secondary | ICD-10-CM

## 2016-12-31 DIAGNOSIS — R262 Difficulty in walking, not elsewhere classified: Secondary | ICD-10-CM | POA: Diagnosis not present

## 2016-12-31 DIAGNOSIS — C801 Malignant (primary) neoplasm, unspecified: Secondary | ICD-10-CM

## 2016-12-31 DIAGNOSIS — Z86711 Personal history of pulmonary embolism: Secondary | ICD-10-CM | POA: Diagnosis not present

## 2016-12-31 DIAGNOSIS — I10 Essential (primary) hypertension: Secondary | ICD-10-CM

## 2016-12-31 DIAGNOSIS — M25512 Pain in left shoulder: Secondary | ICD-10-CM | POA: Diagnosis not present

## 2016-12-31 DIAGNOSIS — R112 Nausea with vomiting, unspecified: Secondary | ICD-10-CM

## 2016-12-31 DIAGNOSIS — I2699 Other pulmonary embolism without acute cor pulmonale: Secondary | ICD-10-CM

## 2016-12-31 DIAGNOSIS — Z7901 Long term (current) use of anticoagulants: Secondary | ICD-10-CM | POA: Diagnosis not present

## 2016-12-31 DIAGNOSIS — C3432 Malignant neoplasm of lower lobe, left bronchus or lung: Secondary | ICD-10-CM | POA: Diagnosis not present

## 2016-12-31 HISTORY — DX: Other specified counseling: Z71.89

## 2016-12-31 HISTORY — DX: Encounter for antineoplastic chemotherapy: Z51.11

## 2016-12-31 HISTORY — DX: Malignant neoplasm of unspecified part of left bronchus or lung: C34.92

## 2016-12-31 LAB — CBC WITH DIFFERENTIAL/PLATELET
BASO%: 0.3 % (ref 0.0–2.0)
Basophils Absolute: 0 10*3/uL (ref 0.0–0.1)
EOS%: 1.8 % (ref 0.0–7.0)
Eosinophils Absolute: 0.1 10*3/uL (ref 0.0–0.5)
HCT: 42.7 % (ref 38.4–49.9)
HGB: 14.5 g/dL (ref 13.0–17.1)
LYMPH%: 18.1 % (ref 14.0–49.0)
MCH: 31.1 pg (ref 27.2–33.4)
MCHC: 34 g/dL (ref 32.0–36.0)
MCV: 91.6 fL (ref 79.3–98.0)
MONO#: 0.5 10*3/uL (ref 0.1–0.9)
MONO%: 7.9 % (ref 0.0–14.0)
NEUT#: 4.4 10*3/uL (ref 1.5–6.5)
NEUT%: 71.9 % (ref 39.0–75.0)
Platelets: 290 10*3/uL (ref 140–400)
RBC: 4.66 10*6/uL (ref 4.20–5.82)
RDW: 13.1 % (ref 11.0–14.6)
WBC: 6.1 10*3/uL (ref 4.0–10.3)
lymph#: 1.1 10*3/uL (ref 0.9–3.3)

## 2016-12-31 LAB — COMPREHENSIVE METABOLIC PANEL
ALT: 29 U/L (ref 0–55)
AST: 14 U/L (ref 5–34)
Albumin: 4.1 g/dL (ref 3.5–5.0)
Alkaline Phosphatase: 82 U/L (ref 40–150)
Anion Gap: 10 mEq/L (ref 3–11)
BUN: 11.2 mg/dL (ref 7.0–26.0)
CO2: 26 mEq/L (ref 22–29)
Calcium: 10.4 mg/dL (ref 8.4–10.4)
Chloride: 103 mEq/L (ref 98–109)
Creatinine: 1.1 mg/dL (ref 0.7–1.3)
EGFR: 77 mL/min/{1.73_m2} — ABNORMAL LOW (ref >90)
Glucose: 136 mg/dl (ref 70–140)
Potassium: 4 mEq/L (ref 3.5–5.1)
Sodium: 139 mEq/L (ref 136–145)
Total Bilirubin: 0.37 mg/dL (ref 0.20–1.20)
Total Protein: 7.6 g/dL (ref 6.4–8.3)

## 2016-12-31 MED ORDER — PROCHLORPERAZINE MALEATE 10 MG PO TABS: 10.0000 mg | ORAL_TABLET | Freq: Four times a day (QID) | ORAL | 0 refills | Status: DC | PRN

## 2016-12-31 MED ORDER — ENOXAPARIN SODIUM 150 MG/ML ~~LOC~~ SOLN: 1.5000 mg/kg | SUBCUTANEOUS | 2 refills | Status: DC

## 2016-12-31 MED ORDER — HYDROCOD POLST-CPM POLST ER 10-8 MG/5ML PO SUER: 5.0000 mL | Freq: Two times a day (BID) | ORAL | 0 refills | Status: DC | PRN

## 2016-12-31 NOTE — Telephone Encounter (Signed)
Gave patient AVS and schedule per 3/22 los.Radiology dpt will call about MRI.

## 2016-12-31 NOTE — Progress Notes (Signed)
START ON PATHWAY REGIMEN - Non-Small Cell Lung     Administer weekly:     Paclitaxel      Carboplatin   **Always confirm dose/schedule in your pharmacy ordering system**  Patient Characteristics: Stage III - Unresectable, PS = 0, 1 AJCC T Category: T1b Current Disease Status: No Distant Mets or Local Recurrence AJCC N Category: N2 AJCC M Category: M0 AJCC 8 Stage Grouping: IIIA Performance Status: PS = 0, 1 Intent of Therapy: Curative Intent, Discussed with Patient 

## 2016-12-31 NOTE — Progress Notes (Signed)
[place] Clinical Social Work  Clinical Social Work met with patient/family at [employer] appointment to offer support and assess for psychosocial needs.  Patient was accompanied by his spouse, Calvin Wagner, for today's visit.  Calvin Wagner reported feeling appropriately overwhelmed due to diagnosis, but feels he is coping adequately.  The patient is eager to start treatment.  Patient's spouse shared it has been difficult waiting to begin treatment.  Calvin Wagner is currently on short term disability for rotator cuff surgery, patient's spouse had questions regarding continuing short term disability for cancer treatment.  CSW reviewed common emotional responses to diagnosis/treatment and assessed patient level of support.  Calvin Wagner reported a strong support system and no concerns at this time.    ONCBCN DISTRESS SCREENING 12/31/2016  Distress experienced in past week (1-10) 8  Emotional problem type Nervousness/Anxiety;Adjusting to illness  Physical Problem type Breathing  Referral to clinical social work Yes    Clinical Social Work briefly discussed Clinical Social Work role and [employer] support programs/services.  Patient's spouse interested in counseling services for patient and caregiver.  Patient interested in free massages available. Clinical Social Work encouraged patient to call with any additional questions or concerns.   Maryjean Morn, MSW, LCSW, OSW-C Clinical Social Worker [facility] [phone]

## 2016-12-31 NOTE — Therapy (Signed)
[place], [place], [address] Phone: [phone]   Fax:  [phone]  Physical Therapy Evaluation  Patient Details  Name: Calvin Wagner MRN: [id number] Date of Birth: 01/22/1966 Referring Provider: Dr. Curt Bears  Encounter Date: 12/31/2016      PT End of Session - 12/31/16 1521    Visit Number 1   Number of Visits 1   PT Start Time 1333   PT Stop Time 1358   PT Time Calculation (min) 25 min   Activity Tolerance Patient tolerated treatment well   Behavior During Therapy [facility] for tasks assessed/performed      Past Medical History:  Diagnosis Date  . Encounter for antineoplastic chemotherapy 12/31/2016  . GERD 11/08/2009   Qualifier: Diagnosis of  By: Ronnald Ramp MD, Arvid Right Goals of care, counseling/discussion 12/31/2016  . HYPERTENSION 11/08/2009   Qualifier: Diagnosis of  By: Ronnald Ramp MD, Arvid Right.   . Migraine 02/25/2012  . Pneumonia   . PONV (postoperative nausea and vomiting)   . Rotator cuff tear, left     Past Surgical History:  Procedure Laterality Date  . INGUINAL HERNIA REPAIR    . SHOULDER ARTHROSCOPY WITH ROTATOR CUFF REPAIR Left 11/12/2016   Procedure: SHOULDER ARTHROSCOPY WITH ROTATOR CUFF REPAIR AND SUBACROMIAL DECOMPRESSION;  Surgeon: Tania Ade, MD;  Location: [place] [facility];  Service: Orthopedics;  Laterality: Left;  SHOULDER ARTHROSCOPY WITH ROTATOR CUFF REPAIR AND SUBACROMIAL DECOMPRESSION  . TONSILLECTOMY    . TOTAL KNEE ARTHROPLASTY    . VIDEO BRONCHOSCOPY Bilateral 12/10/2016   Procedure: VIDEO BRONCHOSCOPY WITHOUT FLUORO;  Surgeon: Tanda Rockers, MD;  Location: [facility];  Service: Cardiopulmonary;  Laterality: Bilateral;    There were no vitals filed for this visit.       Subjective Assessment - 12/31/16 1439    Subjective Reports a fall that resulted in rotator cuff tear so had surgery for that about 6 weeks ago.   Patient is accompained by: Family member  wife   Pertinent History Pt. presented with cough in 2016 and has been followed since then. Has had CTs, bronchoscopy and PET.  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  Has adenopathy, so at least grade IIIA; also has a spot on liver that was hypermetabolic on PET, so needs further workup for that.  Recent rotator cuff tear from a fall and arthroscopic surgery to repair that; is currently doing PT for that. 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  Never smoker.   Patient Stated Goals get info from all lung clinic providers   Currently in Pain? No/denies  pain mid chest to 6/10, only with taking deep breaths, yawning and coughing, described as sharp; better with tylenol            [facility] PT Assessment - 12/31/16 0001      Assessment   Medical Diagnosis stage IIIA (or possibly IV) large cell carcinoma at left mainstem bronchus   Referring Provider Dr. Curt Bears   Onset Date/Surgical Date --  began with cough in 2016 and followed since then; scans 2/18   Prior Therapy currently getting rehab for left rotator cuff tear and repair, 2x/week     Precautions   Precautions Other (comment)   Precaution Comments cancer precautions     Restrictions   Weight Bearing Restrictions No     Balance Screen   Has the patient fallen in the past 6  months Yes  when shoveling snow on incline driveway   How many times? 1  resulted in rotator cuff tear   Has the patient had a decrease in activity level because of a fear of falling?  No   Is the patient reluctant to leave their home because of a fear of falling?  No     Home Environment   Living Environment Private residence   Living Arrangements Spouse/significant other   Type of [place] Two level     Prior Function   Level of Independence Independent   Vocation Other (comment)  on leave for RTC repair   Vocation Requirements needs to lift 50-60 lbs.   Leisure doing PT  2x/week for shoulder rehab; walks around some but gets SOB with walking around yard; does some arm weights, squats and the like at home     Cognition   Overall Cognitive Status Within Functional Limits for tasks assessed     Observation/Other Assessments   Observations well-looking man accompanied by his wife     Functional Tests   Functional tests Sit to Stand     Sit to Stand   Comments 11 times in 30 seconds, below average for age  resulted in mild dyspnea     Posture/Postural Control   Posture/Postural Control No significant limitations   Posture Comments posture is unusally good     ROM / Strength   AROM / PROM / Strength AROM     AROM   Overall AROM Comments trunk AROM in standing is grossly WFL throughout; left shoulder AROM limited secondary to recent rotator cuff injury and surgery     Ambulation/Gait   Ambulation/Gait Yes   Ambulation/Gait Assistance 7: Independent   Gait Comments distance limited by SOB currently; gets SOB with more than walking around the yard     Balance   Balance Assessed Yes     Dynamic Standing Balance   Dynamic Standing - Comments reaches forward 15 inches in standing, about average for age                           PT Education - 12/31/16 1520    Education provided Yes   Education Details energy conservation, posture, breathing, walking, Cure article on staying active, PT info   Person(s) Educated Patient;Spouse   Methods Explanation;Handout   Comprehension Verbalized understanding               Lung Clinic Goals - 12/31/16 1526      Patient will be able to verbalize understanding of the benefit of exercise to decrease fatigue.   Status Achieved     Patient will be able to verbalize the importance of posture.   Status Achieved     Patient will be able to demonstrate diaphragmatic breathing for improved lung function.   Status Achieved     Patient will be able to verbalize understanding of the role  of physical therapy to prevent functional decline and who to contact if physical therapy is needed.   Status Achieved             Plan - 12/31/16 1521    Clinical Impression Statement This is a pleasant gentleman, never smoker, diagnosed today with probable stage IIIA (or higher) neuroendocrine cancer (large cell).  His main issue is shortness of breath with walking a distance or performing other vigorous activity.  He did fall when shoveling snow and  tore his rotator cuff, so had arthroscopic repair and is doing PT for that.  He is out of work currently because of that.  He has a h/o left TKA some 9-10 years ago.  Eval today is low complexity.   Rehab Potential Good   PT Frequency One time visit   PT Treatment/Interventions Patient/family education   PT Next Visit Plan None at this time; patient may benefit from PT going forward to improve endurance and strength.   PT Home Exercise Plan walking program, breathing exercise   Consulted and Agree with Plan of Care Patient      Patient will benefit from skilled therapeutic intervention in order to improve the following deficits and impairments:  Cardiopulmonary status limiting activity, Decreased mobility  Visit Diagnosis: Difficulty in walking, not elsewhere classified - Plan: PT plan of care cert/re-cert  History of falling - Plan: PT plan of care cert/re-cert     Problem List Patient Active Problem List   Diagnosis Date Noted  . Large cell carcinoma of left lung, stage 3 ([place]) 12/31/2016  . Goals of care, counseling/discussion 12/31/2016  . Encounter for antineoplastic chemotherapy 12/31/2016  . Cancer ([place]) 11/25/2016  . Obstructive pneumonia 11/25/2016  . Pulmonary embolus ([place]) 11/25/2016  . Bronchial obstruction   . Lung nodule 11/24/2016  . Dyspnea 11/24/2016  . Cough 10/20/2016  . Pulmonary infiltrates on CXR 10/20/2016  . Asthmatic bronchitis , chronic ([place]) 10/20/2016  . Headache 10/10/2015  . Worsening  headaches 10/10/2015  . Vision changes 10/10/2015  . Snoring 10/10/2015  . Uncontrolled morning headache 10/10/2015  . 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 Dizziness 10/10/2015  . Nausea with vomiting 10/10/2015  . Chest pain 07/21/2014  . Alcohol dependence ([place]) 07/21/2014  . Abdominal pain 07/21/2014  . Chest pain, atypical 07/21/2014  . Chest pain at rest 07/21/2014  . Other abnormal glucose 10/26/2012  . Essential hypertension 11/08/2009  . GERD 11/08/2009    Keimon Basaldua 12/31/2016, 3:28 PM  [place], [place], [address] Phone: [phone]   Fax:  [phone]  Name: KEMPTON MILNE MRN: [id number] Date of Birth: 1966/02/21  Serafina Royals, PT 12/31/16 3:29 PM

## 2016-12-31 NOTE — Progress Notes (Signed)
[facility] [phone]) [phone]   [phone] Multidisciplinary thoracic oncology clinic  CONSULT NOTE  REFERRING PHYSICIAN: Dr. Christinia Gully  REASON FOR CONSULTATION:  51 years old white male recently diagnosed with lung cancer.  HPI Calvin Wagner is a 51 y.o. male with past medical history significant for GERD, hypertension, pneumonia as well as left rotator cuff tear. The patient has a known pulmonary nodule since August 2017 that was followed by routine imaging studies last done in December 2017 was no significant change. On 11/25/2016 the patient was complaining of shortness of breath after recent left rotator cuff surgery. He was found on blood work to have positive d-dimer. CT angiogram of the chest was performed and it showed multiple bilateral acute pulmonary emboli, segmental to subsegmental size. There was also progressive left hilar mass/adenopathy consistent with bronchogenic carcinoma. There is complete obstruction of the lingular and left lower lobe airways. There was also a 1.0 cm pulmonary nodule in the left lateral costophrenic sulcus. On 12/10/2016 the patient underwent bronchoscopy under the care of Dr. Melvyn Novas and the final pathology [id number]) was consistent with poorly differentiated high-grade neuroendocrine carcinoma. A PET scan on 12/17/2016 showed extensive left hilar and mediastinal hypermetabolic lymphadenopathy compatible with the underlying malignancy including a left infrahilar and hilar regions. There was also subcarinal lymphadenopathy measuring 1.0 cm in short axis which was hypermetabolic with SUV max of 7.6. There was also small left lower lobe pulmonary nodule measuring 1.1 cm which is mildly hypermetabolic and this could potentially represent the primary bronchogenic neoplasm. There was no evidence for extrathoracic metastatic disease. Dr. Melvyn Novas kindly referred the patient to the multidisciplinary thoracic oncology clinic today for  further evaluation and recommendation regarding treatment of his condition. When seen today the patient is feeling fine with no specific complaints except for dry cough as well as shortness breath and pain with deep breath. He denied having any hemoptysis. He has occasional nosebleed. He has intermittent headache with occasional dizzy spells and nausea. No significant weight loss or night sweats. He is currently on Lovenox 100 mg subcutaneously twice a day for the recent diagnosis of pulmonary embolism. Family history significant for father with COPD and mother had heart disease. The patient is married and has 2 sons. He was accompanied today by his wife, Baker Janus. He works in a [employer]. He has no history of smoking but drinks alcohol occasionally and no history of drug abuse.  HPI  Past Medical History:  Diagnosis Date  . GERD 11/08/2009   Qualifier: Diagnosis of  By: Ronnald Ramp MD, Arvid Right.   . HYPERTENSION 11/08/2009   Qualifier: Diagnosis of  By: Ronnald Ramp MD, Arvid Right.   . Migraine 02/25/2012  . Pneumonia   . PONV (postoperative nausea and vomiting)   . Rotator cuff tear, left     Past Surgical History:  Procedure Laterality Date  . INGUINAL HERNIA REPAIR    . SHOULDER ARTHROSCOPY WITH ROTATOR CUFF REPAIR Left 11/12/2016   Procedure: SHOULDER ARTHROSCOPY WITH ROTATOR CUFF REPAIR AND SUBACROMIAL DECOMPRESSION;  Surgeon: Tania Ade, MD;  Location: [facility];  Service: Orthopedics;  Laterality: Left;  SHOULDER ARTHROSCOPY WITH ROTATOR CUFF REPAIR AND SUBACROMIAL DECOMPRESSION  . TONSILLECTOMY    . TOTAL KNEE ARTHROPLASTY    . VIDEO BRONCHOSCOPY Bilateral 12/10/2016   Procedure: VIDEO BRONCHOSCOPY WITHOUT FLUORO;  Surgeon: Tanda Rockers, MD;  Location: [facility];  Service: Cardiopulmonary;  Laterality: Bilateral;    Family History  Problem Relation Age of Onset  .  Heart disease Mother 23  . Lung cancer      family history  . CAD      strong family history male and male <50  . Mental  retardation    . Heart attack Brother 34  . Alcohol abuse Neg Hx   . Diabetes Neg Hx   . Early death Neg Hx   . Hyperlipidemia Neg Hx   . Hypertension Neg Hx   . Kidney disease Neg Hx   . Stroke Neg Hx   . Migraines Neg Hx     Social History Social History  Substance Use Topics  . Smoking status: Never Smoker  . Smokeless tobacco: Never Used  . Alcohol use Yes     Comment: social    Allergies  Allergen Reactions  . No Known Allergies     Current Outpatient Prescriptions  Medication Sig Dispense Refill  . acetaminophen-codeine (TYLENOL #4) 300-60 MG tablet Take 1 tablet by mouth every 4 (four) hours as needed for moderate pain. 100 tablet 0  . amLODipine (NORVASC) 10 MG tablet Take 1 tablet (10 mg total) by mouth daily. 30 tablet 0  . budesonide-formoterol (SYMBICORT) 80-4.5 MCG/ACT inhaler Inhale 2 puffs into the lungs 2 (two) times daily. 1 Inhaler 5  . enoxaparin (LOVENOX) 100 MG/ML injection Inject 1 mL (100 mg total) into the skin every 12 (twelve) hours. 60 mL 0  . Multiple Vitamin (MULTIVITAMIN WITH MINERALS) TABS tablet Take 1 tablet by mouth daily.    . pantoprazole (PROTONIX) 40 MG tablet Take 40 mg by mouth daily.  2   No current facility-administered medications for this visit.     Review of Systems  Constitutional: positive for fatigue Eyes: negative Ears, nose, mouth, throat, and face: negative Respiratory: positive for cough, dyspnea on exertion and pleurisy/chest pain Cardiovascular: negative Gastrointestinal: positive for nausea Genitourinary:negative Integument/breast: negative Hematologic/lymphatic: negative Musculoskeletal:negative Neurological: negative Behavioral/Psych: negative Endocrine: negative Allergic/Immunologic: negative  Physical Exam  HBZ:JIRCV,ELFYB,  no acute distress. SKIN: skin color, texture, turgor are normal, no rashes or significant lesions HEAD: Normocephalic, No masses, lesions, tenderness or abnormalities EYES:  normal, PERRLA, Conjunctiva are pink and non-injected EARS: External ears normal, Canals clear OROPHARYNX:no exudate, no erythema and lips, buccal mucosa, and tongue normal  NECK: supple, no adenopathy, no JVD LYMPH:  no palpable lymphadenopathy, no hepatosplenomegaly LUNGS: prolonged expiratory phase, expiratory wheezes bilaterally HEART: regular rate & rhythm, no murmurs and no gallops ABDOMEN:abdomen soft, non-tender, normal bowel sounds and no masses or organomegaly BACK: Back symmetric, no curvature., No CVA tenderness EXTREMITIES:no joint deformities, effusion, or inflammation, no edema, no skin discoloration  NEURO: alert & oriented x 3 with fluent speech, no focal motor/sensory deficits  PERFORMANCE STATUS: ECOG 1  LABORATORY DATA: Lab Results  Component Value Date   WBC 6.1 12/31/2016   HGB 14.5 12/31/2016   HCT 42.7 12/31/2016   MCV 91.6 12/31/2016   PLT 290 12/31/2016      Chemistry      Component Value Date/Time   NA 139 12/31/2016 1236   K 4.0 12/31/2016 1236   CL 103 11/26/2016 0438   CO2 26 12/31/2016 1236   BUN 11.2 12/31/2016 1236   CREATININE 1.1 12/31/2016 1236      Component Value Date/Time   CALCIUM 10.4 12/31/2016 1236   ALKPHOS 82 12/31/2016 1236   AST 14 12/31/2016 1236   ALT 29 12/31/2016 1236   BILITOT 0.37 12/31/2016 1236       RADIOGRAPHIC STUDIES: Nm Pet Image Initial (  pi) Skull Base To Thigh  Result Date: 12/17/2016 CLINICAL DATA:  Initial treatment strategy for pulmonary infiltrates. EXAM: NUCLEAR MEDICINE PET SKULL BASE TO THIGH TECHNIQUE: 13.08 mCi F-18 FDG was injected intravenously. Full-ring PET imaging was performed from the skull base to thigh after the radiotracer. CT data was obtained and used for attenuation correction and anatomic localization. FASTING BLOOD GLUCOSE:  Value: 99 mg/dl COMPARISON:  No prior PET-CT.  Chest CT 11/25/2016. FINDINGS: NECK No hypermetabolic lymph nodes in the neck. CHEST Previously described left lower  lobe pulmonary nodule currently measures 1.1 cm (axial image 136 of series 3) and is mildly hypermetabolic (SUVmax = 3.9). Compared to the recent prior examination, the dense consolidative changes noted in the left lower lobe have partially resolved, predominantly with some residual ground-glass attenuation in its wake, much of which is nodular in appearance. This nodular ground-glass attenuation does demonstrate some hypermetabolism, most notable in the superior segment of the left lower lobe (SUVmax = 4.5- 6.1), which is presumably indicative of resolving infection/inflammation. Extensive bulky soft tissue and hypermetabolism is noted in the left infrahilar and hilar regions. Discrete lymph nodes are difficult to accurately measure on the noncontrast portion of today's examination, however, this tissue is markedly hypermetabolic (SUVmax = 1.2-75.1). There is also subcarinal lymphadenopathy measuring 1 cm in short axis which is hypermetabolic (SUVmax = 7.6) and a tiny nonenlarged superior mediastinal lymph node posterior to the proximal trachea measuring 5 mm (axial image 82 of series 3) which is hypermetabolic (SUVmax = 3.5). Trace left pleural effusion lying dependently. Some cylindrical bronchiectasis and thickening of the peribronchovascular interstitium is noted throughout the left lower lobe. ABDOMEN/PELVIS In segment 5 of the liver there is a small focus of potential mild hypermetabolism (SUVmax = 5.7)which stands out slightly against the background activity in the hepatic parenchyma (SUVmax = 4.1), which could reflect a solitary hepatic metastasis. No discrete hepatic lesion is confidently identified in this region on the noncontrast CT images. No abnormal hypermetabolic activity within the pancreas, adrenal glands, or spleen. No hypermetabolic lymph nodes in the abdomen or pelvis. No significant volume of ascites. No pneumoperitoneum. Aortic atherosclerosis. Status post bilateral inguinal herniorrhaphy.  SKELETON No focal hypermetabolic activity to suggest skeletal metastasis. IMPRESSION: 1. Extensive left hilar and mediastinal all hypermetabolic lymphadenopathy, compatible with underlying malignancy. These findings could reflect either primary lymphoma or bronchogenic carcinoma such as a small cell carcinoma. 2. There is also a small left lower lobe pulmonary nodule measuring 11 mm which is mildly hypermetabolic. This could potentially represent the primary bronchogenic neoplasm, but it is un usual for a small nodule of this size to result in such extensive lymphadenopathy. 3. Previously noted areas of consolidation in the left lower lobe have significantly improved compared to the prior studies, and although some of the residual ground-glass attenuation in the left lower lobe does demonstrate some hypermetabolism, this is favored to reflect an underlying resolving infectious/inflammatory process. 4. Trace left pleural effusion lying dependently. 5. Aortic atherosclerosis. Electronically Signed   By: Vinnie Langton M.D.   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 The patient also has suspicious area in the liver seen on the recent PET scan and discussed at the weekly thoracic conference today.  PLAN: I had a lengthy discussion with the patient and his wife about his current disease is stage, prognosis and treatment options. I recommended for the patient to complete the staging workup by ordering a MRI of the brain as well as MRI of the liver to rule out any metastatic disease. There was insufficient material for molecular studies in this patient who is a never smoker. I recommended for the patient to  have a blood test done by [facility] to rule out any actionable mutations. I discussed with the patient and his treatment options for the locally advanced disease. I recommended for the patient a course of concurrent chemoradiation with weekly carboplatin for AUC of 2 and paclitaxel 45 MG/M2. I discussed with the patient adverse effect of the chemotherapy including but not limited to alopecia, myelosuppression, nausea and vomiting, peripheral neuropathy, liver or renal dysfunction. I will arrange for the patient to have a chemotherapy education class before starting the first dose of his chemotherapy. He is expected to start the first dose of this treatment on 01/11/2017. I will call his pharmacy with prescription for Compazine 10 mg by mouth every 6 hours as needed for nausea. For the history of pulmonary embolism, we will change his dose of Lovenox to 1.5 MG/KG once daily. I will see the patient back for follow-up visit in 3 weeks for evaluation and management of any adverse effect of his treatment. The patient will see Dr. Sondra Come for evaluation and discussion of the radiotherapy option. He was seen during the multidisciplinary thoracic oncology clinic today by medical oncology, radiation oncology, thoracic navigator, physical therapist and social worker. He was advised to call immediately if he has any concerning symptoms in the interval. The patient voices understanding of current disease status and treatment options and is in agreement with the current care plan.  All questions were answered. The patient knows to call the clinic with any problems, questions or concerns. We can certainly see the patient much sooner if necessary.  Thank you so much for allowing me to participate in the care of BRACEN SCHUM. I will continue to follow up the patient with you and assist in his care.  I spent 55 minutes counseling the patient face to face. The total time spent in the appointment was 80  minutes.  Disclaimer: This note was dictated with voice recognition software. Similar sounding words can inadvertently be transcribed and may not be corrected upon review.   Tae Vonada K. December 31, 2016, 1:26 PM

## 2016-12-31 NOTE — Progress Notes (Signed)
Radiation Oncology         [phone] ________________________________  Multidisciplinary Thoracic Oncology Clinic [facility]) Initial Outpatient Consultation  Name: Calvin Wagner MRN: [id number]  Date: 12/31/2016  DOB: 23-Dec-1965  HA:LPFXTK, Marjory Lies, MD  Tanda Rockers, MD   REFERRING PHYSICIAN: Tanda Rockers, MD  DIAGNOSIS: The encounter diagnosis was Large cell carcinoma of left lung, stage 3 ([place]).    ICD-9-CM ICD-10-CM   1. 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 male who had a CT of the abd/pelvis in April 2017 for mid-upper abdominal pain. It noted a 0.9 x 0.6 cm left lower lobe nodule. CT scan of the chest in August 2018 showed the known left lower lobe pulmonary nodule measuring 1.0 x 0.8 cm and an enlarged left infrahilar lymph node measuring 2.8 x 1.6 cm. Short-term follow up was recommended. CT of the chest in December 2017 showed the nodules in the left lower lobe measured 0.7 x 1.0 cm. However, there was interval increase in size of the left hilar node measuring 2.2 x 2.5 cm and development of a left infrahilar node measuring 1.9 x 2 cm with significant focal impression and possible invasion of the proximal left lower lobe bronchus. PET scan vs left bronchoscopic node sample were recommended. On 11/25/2016 the patient was complaining of shortness of breath after recent left rotator cuff surgery. He was found on blood work to have positive d-dimer. CT angiogram of the chest was performed and it showed multiple bilateral acute pulmonary emboli, segmental to subsegmental size. There was also progressive left hilar mass/adenopathy consistent with bronchogenic carcinoma. There is complete obstruction of the lingular and left lower lobe airways. There was also a 1.0  cm pulmonary nodule in the left lateral costophrenic sulcus.  Workup in the ED noted bilateral acute pulmonary emboli. He is on Lovenox 100 mg subcutaneously BID. On 12/10/2016 the patient underwent bronchoscopy under the care of Dr. Melvyn Novas and the final pathology [phone]) was consistent with poorly differentiated high-grade neuroendocrine carcinoma. There was insufficient material for molecular studies. A PET scan on 12/17/2016 showed extensive left hilar and mediastinal hypermetabolic lymphadenopathy compatible with the underlying malignancy including a left infrahilar and hilar regions. There was also subcarinal lymphadenopathy measuring 1.0 cm in short axis which was hypermetabolic with SUV max of 7.6. There was also a small left lower lobe pulmonary nodule measuring 1.1 cm which is mildly hypermetabolic and this could potentially represent the primary bronchogenic neoplasm. There was no evidence for extrathoracic metastatic disease. The patient and his wife were referred today by Dr. Melvyn Novas for presentation in the multidisciplinary conference.  PREVIOUS RADIATION THERAPY: No  PAST MEDICAL HISTORY:  has a past medical history of Encounter for antineoplastic chemotherapy (12/31/2016); GERD (11/08/2009); Goals of care, counseling/discussion (12/31/2016); HYPERTENSION (11/08/2009); Migraine (02/25/2012); Pneumonia; PONV (postoperative nausea and vomiting); and Rotator cuff tear, left.    PAST SURGICAL HISTORY: Past Surgical History:  Procedure Laterality Date  . INGUINAL HERNIA REPAIR    . SHOULDER ARTHROSCOPY WITH ROTATOR CUFF REPAIR Left 11/12/2016   Procedure: SHOULDER ARTHROSCOPY WITH ROTATOR CUFF REPAIR AND SUBACROMIAL DECOMPRESSION;  Surgeon: Tania Ade, MD;  Location: [facility];  Service: Orthopedics;  Laterality: Left;  SHOULDER ARTHROSCOPY WITH ROTATOR CUFF REPAIR AND SUBACROMIAL DECOMPRESSION  . TONSILLECTOMY    .  TOTAL KNEE ARTHROPLASTY    . VIDEO BRONCHOSCOPY Bilateral 12/10/2016   Procedure:  VIDEO BRONCHOSCOPY WITHOUT FLUORO;  Surgeon: Tanda Rockers, MD;  Location: [facility];  Service: Cardiopulmonary;  Laterality: Bilateral;    FAMILY HISTORY: family history includes Heart attack (age of onset: 25) in his brother; Heart disease (age of onset: 38) in his mother.  SOCIAL HISTORY:  reports that he has never smoked. He has never used smokeless tobacco. He reports that he drinks alcohol. He reports that he does not use drugs.  ALLERGIES: No known allergies  MEDICATIONS:  Current Outpatient Prescriptions  Medication Sig Dispense Refill  . acetaminophen-codeine (TYLENOL #4) 300-60 MG tablet Take 1 tablet by mouth every 4 (four) hours as needed for moderate pain. 100 tablet 0  . amLODipine (NORVASC) 10 MG tablet Take 1 tablet (10 mg total) by mouth daily. 30 tablet 0  . budesonide-formoterol (SYMBICORT) 80-4.5 MCG/ACT inhaler Inhale 2 puffs into the lungs 2 (two) times daily. 1 Inhaler 5  . chlorpheniramine-HYDROcodone (TUSSIONEX) 10-8 MG/5ML SUER Take 5 mLs by mouth every 12 (twelve) hours as needed for cough. 140 mL 0  . enoxaparin (LOVENOX) 150 MG/ML injection Inject 1.02 mLs (155 mg total) into the skin daily. 30 mL 2  . Multiple Vitamin (MULTIVITAMIN WITH MINERALS) TABS tablet Take 1 tablet by mouth daily.    . pantoprazole (PROTONIX) 40 MG tablet Take 40 mg by mouth daily.  2  . prochlorperazine (COMPAZINE) 10 MG tablet Take 1 tablet (10 mg total) by mouth every 6 (six) hours as needed for nausea or vomiting. 30 tablet 0   No current facility-administered medications for this encounter.     REVIEW OF SYSTEMS:  A 10 point review of systems is documented in the electronic medical record. This was obtained by the nursing staff. However, I reviewed this with the patient to discuss relevant findings and make appropriate changes.  Pertinent items noted in HPI and remainder of comprehensive ROS otherwise negative.   He has a dry cough, SOB, and pain with deep inspiration. He denies  hemoptysis. He reports intermittent headaches, but none at this time. He reports a decrease in appetite. The patient has left shoulder pain from rotator cuff surgery performed in January 2018. He has some limited ROM of the left arm. The patient reports being unable to work due to fatigue and his SOB and recent surgery. The patient's cough makes it difficult for him to sleep.   PHYSICAL EXAM:  Vitals with BMI 12/31/2016  Height '6\' 1"'$   Weight 224 lbs 11 oz  BMI 09.7  Systolic 353  Diastolic 99  Pulse 99  Respirations 18   General: Alert and oriented, in no acute distress HEENT: Head is normocephalic. Extraocular movements are intact. Oropharynx is clear. Neck: Neck is supple, no palpable cervical or supraclavicular lymphadenopathy. Heart: Regular in rate and rhythm with no murmurs, rubs, or gallops. Chest: (+) Decreased breath sounds throughout the left lung. Some wheezing Abdomen: Soft, nontender, nondistended, with no rigidity or guarding. Extremities: No cyanosis or edema. Lymphatics: see Neck Exam Skin: No concerning lesions. Musculoskeletal: symmetric strength and muscle tone throughout. (+) limited ROM of his left shoulder from a recent rotator cuff surgery. Neurologic: Cranial nerves II through XII are grossly intact. No obvious focalities. Speech is fluent. Coordination is intact. Psychiatric: Judgment and insight are intact. Affect is appropriate  KPS = 80  LABORATORY DATA:  Lab Results  Component Value Date   WBC 6.1 12/31/2016   HGB 14.5  12/31/2016   HCT 42.7 12/31/2016   MCV 91.6 12/31/2016   PLT 290 12/31/2016   Lab Results  Component Value Date   NA 139 12/31/2016   K 4.0 12/31/2016   CL 103 11/26/2016   CO2 26 12/31/2016   Lab Results  Component Value Date   ALT 29 12/31/2016   AST 14 12/31/2016   ALKPHOS 82 12/31/2016   BILITOT 0.37 12/31/2016    PULMONARY FUNCTION TEST:   Recent Review Flowsheet Data    There is no flowsheet data to display.       RADIOGRAPHY: Nm Pet Image Initial (pi) Skull Base To Thigh  Result Date: 12/17/2016 CLINICAL DATA:  Initial treatment strategy for pulmonary infiltrates. EXAM: NUCLEAR MEDICINE PET SKULL BASE TO THIGH TECHNIQUE: 13.08 mCi F-18 FDG was injected intravenously. Full-ring PET imaging was performed from the skull base to thigh after the radiotracer. CT data was obtained and used for attenuation correction and anatomic localization. FASTING BLOOD GLUCOSE:  Value: 99 mg/dl COMPARISON:  No prior PET-CT.  Chest CT 11/25/2016. FINDINGS: NECK No hypermetabolic lymph nodes in the neck. CHEST Previously described left lower lobe pulmonary nodule currently measures 1.1 cm (axial image 136 of series 3) and is mildly hypermetabolic (SUVmax = 3.9). Compared to the recent prior examination, the dense consolidative changes noted in the left lower lobe have partially resolved, predominantly with some residual ground-glass attenuation in its wake, much of which is nodular in appearance. This nodular ground-glass attenuation does demonstrate some hypermetabolism, most notable in the superior segment of the left lower lobe (SUVmax = 4.5- 6.1), which is presumably indicative of resolving infection/inflammation. Extensive bulky soft tissue and hypermetabolism is noted in the left infrahilar and hilar regions. Discrete lymph nodes are difficult to accurately measure on the noncontrast portion of today's examination, however, this tissue is markedly hypermetabolic (SUVmax = 8.7-56.4). There is also subcarinal lymphadenopathy measuring 1 cm in short axis which is hypermetabolic (SUVmax = 7.6) and a tiny nonenlarged superior mediastinal lymph node posterior to the proximal trachea measuring 5 mm (axial image 82 of series 3) which is hypermetabolic (SUVmax = 3.5). Trace left pleural effusion lying dependently. Some cylindrical bronchiectasis and thickening of the peribronchovascular interstitium is noted throughout the left lower lobe.  ABDOMEN/PELVIS In segment 5 of the liver there is a small focus of potential mild hypermetabolism (SUVmax = 5.7)which stands out slightly against the background activity in the hepatic parenchyma (SUVmax = 4.1), which could reflect a solitary hepatic metastasis. No discrete hepatic lesion is confidently identified in this region on the noncontrast CT images. No abnormal hypermetabolic activity within the pancreas, adrenal glands, or spleen. No hypermetabolic lymph nodes in the abdomen or pelvis. No significant volume of ascites. No pneumoperitoneum. Aortic atherosclerosis. Status post bilateral inguinal herniorrhaphy. SKELETON No focal hypermetabolic activity to suggest skeletal metastasis. IMPRESSION: 1. Extensive left hilar and mediastinal all hypermetabolic lymphadenopathy, compatible with underlying malignancy. These findings could reflect either primary lymphoma or bronchogenic carcinoma such as a small cell carcinoma. 2. There is also a small left lower lobe pulmonary nodule measuring 11 mm which is mildly hypermetabolic. This could potentially represent the primary bronchogenic neoplasm, but it is un usual for a small nodule of this size to result in such extensive lymphadenopathy. 3. Previously noted areas of consolidation in the left lower lobe have significantly improved compared to the prior studies, and although some of the residual ground-glass attenuation in the left lower lobe does demonstrate some hypermetabolism, this is favored to reflect  an underlying resolving infectious/inflammatory process. 4. Trace left pleural effusion lying dependently. 5. Aortic atherosclerosis. Electronically Signed   By: Vinnie Langton M.D.   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 We discussed definitive chemoradiation in the management of  his disease. We discussed 6 weeks of treatment as an outpatient with radiation directed to the left lung. We discussed the process of CT simulation and the placement of tattoos. We discussed dysphagia, weight loss, skin irritation, and fatigue as the acute side effects of radiation. We discussed damage to critical normal structures in the chest as well as the spinal cord as possible but improbably. We will schedule him for CT sim next week  and begin treatments as soon as possible. The patient signed a consent form and a copy was placed in his medical chart. The patient should tolerate treatment relatively well since he is young and in good health.  PLAN: The patient will complete staging workup with an MRI of the brain and MRI of the liver. Since there was insufficient material for molecular studies, Dr. Julien Nordmann recommended a blood test by [facility] for mutations. The patient will undergo 6 weeks of concurrent chemoradiation with weekly carboplatin for AUC of 2 and paclitaxel 45 MG/M2. He is expected to start chemotherapy on 01/11/17. For the patient's cough, I prescribed Tussionex. He will stop the Tylenol #4. Dr. Julien Nordmann will manage the patient's Lovenox.  Treatments and dose to the chest will be limited  (~2 weeks) if he is found to have metastatic disease to the liver or brain.     ------------------------------------------------ -----------------------------------  Blair Promise, PhD, MD  This document serves as a record of services personally performed by Gery Pray, MD. It was created on his behalf by Darcus Austin, a trained medical scribe. The creation of this record is based on the scribe's personal observations and the provider's statements to them. This document has been checked and approved by the attending provider.

## 2017-01-01 ENCOUNTER — Telehealth: Payer: Self-pay

## 2017-01-01 ENCOUNTER — Other Ambulatory Visit: Payer: Self-pay | Admitting: Medical Oncology

## 2017-01-01 ENCOUNTER — Encounter: Payer: Self-pay | Admitting: *Deleted

## 2017-01-01 ENCOUNTER — Telehealth: Payer: Self-pay | Admitting: *Deleted

## 2017-01-01 DIAGNOSIS — I2699 Other pulmonary embolism without acute cor pulmonale: Secondary | ICD-10-CM

## 2017-01-01 MED ORDER — ENOXAPARIN SODIUM 150 MG/ML ~~LOC~~ SOLN
1.5000 mg/kg | SUBCUTANEOUS | 2 refills | Status: DC
Start: 2017-01-01 — End: 2017-04-06

## 2017-01-01 NOTE — Telephone Encounter (Signed)
Wife notified. auth obtained for 1 year for enoxaparin.

## 2017-01-01 NOTE — Telephone Encounter (Signed)
[facility] 150 [facility]day

## 2017-01-01 NOTE — Telephone Encounter (Signed)
Initiated the PA via [place]:  [id number] PA Case ID:  [id number]  Can take 24-72 hours for update.  Will send to [place] to follow up on PA.  thanks

## 2017-01-01 NOTE — Telephone Encounter (Signed)
Calvin Wagner with [facility] calling stating she received a fax from [place] this AM about this but cannot read this.  She is asking we please call at [phone].

## 2017-01-01 NOTE — Telephone Encounter (Signed)
Wife called that Dr Julien Nordmann decreased lovenox injection. Dr Julien Nordmann wanted to up it to 150 mg so he would only have to take once per day. Now [facility] [address] says they got a prescription for only 100 mg and they would also need to do a prior authorization. Currently he only has 2 syringes of 100 mg left.  Wife cell [phone].

## 2017-01-01 NOTE — Progress Notes (Signed)
Oncology Nurse Navigator Documentation  Oncology Nurse Navigator Flowsheets 01/01/2017  Navigator Location [facility]  Navigator Encounter Type Clinic/MDC/I spoke with Mr and Ms Levene yesterday at thoracic clinic.  I gave and explained information on lung cancer, resources at cancer center and next steps.  Mr. Bonfield is set up for [facility] on 01/04/17 and he is aware of appt. Patient also completed blood work for [employer] [facility] and I completed required forms.  Today, I requested urgent authorization for scans.    Abnormal Finding Date 11/25/2016  Confirmed Diagnosis Date 12/10/2016  Multidisiplinary Clinic Date 12/31/2016  Treatment Initiated Date 01/04/2017  Patient Visit Type MedOnc  Treatment Phase Pre-Tx/Tx Discussion  Barriers/Navigation Needs Coordination of Care;Education  Education Newly Diagnosed Cancer Education;Other  Interventions Coordination of Care;Education  Coordination of Care Other  Education Method Verbal;Written  Acuity Level 2  Acuity Level 2 Educational needs;Other  Time Spent with Patient 45

## 2017-01-01 NOTE — Telephone Encounter (Signed)
Called and spoke with pharmacy and they stated that the medication will need a PA done. Will hold in triage to completed the PA

## 2017-01-01 NOTE — Telephone Encounter (Signed)
Oncology Nurse Navigator Documentation  Oncology Nurse Navigator Flowsheets 01/01/2017  Navigator Location [facility]  Navigator Encounter Type Telephone/I received authorization clearance for patient's MRI Brain.  I called central scheduling to request an appt.  I called Calvin Wagner and left vm message of appt.  I asked that he call me for more information if needed. I left my name and phone number.   Treatment Phase Pre-Tx/Tx Discussion  Barriers/Navigation Needs Coordination of Care  Interventions Coordination of Care  Coordination of Care Appts  Acuity Level 2  Acuity Level 2 Assistance expediting appointments  Time Spent with Patient 15

## 2017-01-01 NOTE — Telephone Encounter (Signed)
lovenox needs pa-called [phone]. Case number [id number] it will  take 1-4 days

## 2017-01-02 ENCOUNTER — Ambulatory Visit (HOSPITAL_COMMUNITY): Admission: RE | Admit: 2017-01-02 | Payer: 59 | Source: Ambulatory Visit

## 2017-01-02 ENCOUNTER — Ambulatory Visit (HOSPITAL_COMMUNITY)
Admission: RE | Admit: 2017-01-02 | Discharge: 2017-01-02 | Disposition: A | Discharge status: Home / Self Care | Payer: 59 | Source: Ambulatory Visit | Attending: Internal Medicine | Admitting: Internal Medicine

## 2017-01-02 DIAGNOSIS — C801 Malignant (primary) neoplasm, unspecified: Secondary | ICD-10-CM | POA: Diagnosis not present

## 2017-01-02 DIAGNOSIS — C7931 Secondary malignant neoplasm of brain: Secondary | ICD-10-CM | POA: Diagnosis not present

## 2017-01-02 DIAGNOSIS — C3492 Malignant neoplasm of unspecified part of left bronchus or lung: Secondary | ICD-10-CM | POA: Diagnosis not present

## 2017-01-02 MED ORDER — GADOBENATE DIMEGLUMINE 529 MG/ML IV SOLN
20.0000 mL | Freq: Once | INTRAVENOUS | Status: AC | PRN
  Administered 2017-01-02: 20 mL via INTRAVENOUS

## 2017-01-04 ENCOUNTER — Other Ambulatory Visit: Payer: Self-pay | Admitting: Radiation Therapy

## 2017-01-04 ENCOUNTER — Ambulatory Visit
Admission: RE | Admit: 2017-01-04 | Discharge: 2017-01-04 | Disposition: A | Discharge status: Home / Self Care | Payer: 59 | Source: Ambulatory Visit | Attending: Radiation Oncology | Admitting: Radiation Oncology

## 2017-01-04 DIAGNOSIS — Z7951 Long term (current) use of inhaled steroids: Secondary | ICD-10-CM | POA: Insufficient documentation

## 2017-01-04 DIAGNOSIS — K219 Gastro-esophageal reflux disease without esophagitis: Secondary | ICD-10-CM | POA: Insufficient documentation

## 2017-01-04 DIAGNOSIS — Z8249 Family history of ischemic heart disease and other diseases of the circulatory system: Secondary | ICD-10-CM | POA: Diagnosis not present

## 2017-01-04 DIAGNOSIS — Z510 Encounter for antineoplastic radiation therapy: Secondary | ICD-10-CM | POA: Diagnosis not present

## 2017-01-04 DIAGNOSIS — C7931 Secondary malignant neoplasm of brain: Secondary | ICD-10-CM | POA: Insufficient documentation

## 2017-01-04 DIAGNOSIS — C3492 Malignant neoplasm of unspecified part of left bronchus or lung: Secondary | ICD-10-CM

## 2017-01-04 DIAGNOSIS — Z7901 Long term (current) use of anticoagulants: Secondary | ICD-10-CM | POA: Diagnosis not present

## 2017-01-04 DIAGNOSIS — Z79899 Other long term (current) drug therapy: Secondary | ICD-10-CM | POA: Diagnosis not present

## 2017-01-04 DIAGNOSIS — C3432 Malignant neoplasm of lower lobe, left bronchus or lung: Secondary | ICD-10-CM | POA: Diagnosis not present

## 2017-01-04 DIAGNOSIS — I10 Essential (primary) hypertension: Secondary | ICD-10-CM | POA: Diagnosis not present

## 2017-01-04 DIAGNOSIS — F40240 Claustrophobia: Secondary | ICD-10-CM | POA: Diagnosis not present

## 2017-01-04 DIAGNOSIS — C7949 Secondary malignant neoplasm of other parts of nervous system: Principal | ICD-10-CM

## 2017-01-04 NOTE — Telephone Encounter (Signed)
The lovenox was denied  I have placed denial and appeal information in Dr Gustavus Bryant lookat

## 2017-01-04 NOTE — Telephone Encounter (Signed)
This was for the 100 strength , not the one he's on now

## 2017-01-04 NOTE — Progress Notes (Signed)
  Radiation Oncology         [phone]) [phone] ________________________________  Name: Calvin Wagner MRN: [id number]  Date: 01/04/2017  DOB: 08/09/1966  SIMULATION AND TREATMENT PLANNING NOTE    ICD-9-CM ICD-10-CM   1. 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  Identity was confirmed.  All relevant records and images related to the planned course of therapy were reviewed.  The patient freely provided informed written consent to proceed with treatment after reviewing the details related to the planned course of therapy. The consent form was witnessed and verified by the simulation staff.  Then, the patient was set-up in a stable reproducible  supine position for radiation therapy.  CT images were obtained.  Surface markings were placed.  The CT images were loaded into the planning software.  Then the target and avoidance structures were contoured.  Treatment planning then occurred.  The radiation prescription was entered and confirmed.  Then, I designed and supervised the construction of a total of 5 medically necessary complex treatment devices.  I have requested : This treatment constitutes a Special Treatment Procedure for the following reason: Concurrent chemotherapy requiring careful monitoring for increased toxicities of treatment including weekly laboratory values..  I have ordered:dose calc.  PLAN:  The patient will receive 60 Gy in 30 fractions directed at the chest area. Later this week the patient will undergo a 3T MRI of the brain for further evaluation of his solitary brain metastasis and upcoming SRS treatment.  Special Treatment Procedure Note: The patient will be receiving radiosensitizing chemotherapy. Given the potential of increased  toxicities related to combined therapy and the necessity for close monitoring of the patient and blood work, this constitutes a special treatment procedure.  -----------------------------------  Blair Promise, PhD, MD  This document serves as a record of services personally performed by Gery Pray, MD. It was created on his behalf by Bethann Humble, a trained medical scribe. The creation of this record is based on the scribe's personal observations and the provider's statements to them. This document has been checked and approved by the attending provider.

## 2017-01-05 ENCOUNTER — Encounter: Payer: Self-pay | Admitting: *Deleted

## 2017-01-05 DIAGNOSIS — M75112 Incomplete rotator cuff tear or rupture of left shoulder, not specified as traumatic: Secondary | ICD-10-CM | POA: Diagnosis not present

## 2017-01-05 DIAGNOSIS — M25512 Pain in left shoulder: Secondary | ICD-10-CM | POA: Diagnosis not present

## 2017-01-05 DIAGNOSIS — Z4789 Encounter for other orthopedic aftercare: Secondary | ICD-10-CM | POA: Diagnosis not present

## 2017-01-05 NOTE — Progress Notes (Addendum)
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 No associated edema or mass effect. 2. Otherwise negative MRI appearance of the brain.  Was seen By Dr. Sondra Come 12/31/16= referred for [facility], next MRI 3T brain,  01/16/17 along with MR [facility]  Diagnosis 3/1/218: Dr. Legrand Como Wert,MD Endobronchial biopsy, left mainstem bronchus distal - POSITIVE FOR POORLY DIFFERENTIATED HIGH GRADE NEUROENDOCRINE CARCINOMA.  Left shoulder pain, rotator cuff repair February 1/ 2018,  B/L pulmonary emboli's on Lovenox,  Dr. Julien Nordmann to manage bid The patient will undergo 6 weeks of concurrent chemoradiation with weekly carboplatin for AUC of 2 and paclitaxel 45 MG/M2. He is expected to start chemotherapy on 01/11/17.  Port and treat radiation Chest  01/13/2017 Dr. Pernell Dupre and treat radiation chest on 01/13/2017 Port and treat Chest  4/4/218 BP (!) 154/90 (BP Location: Left Arm, Patient Position: Sitting, Cuff Size: Normal)   Pulse 97   Temp 98.6 F (37 C) (Oral)   Resp 18   Ht '6\' 1"'$  (1.854 m)   Wt 223 lb (101.2 kg)   SpO2 99%   BMI 29.42 kg/m   Wt Readings from Last 3 Encounters:  01/07/17 223 lb (101.2 kg)  12/31/16 224 lb 11.2 oz (101.9 kg)  12/02/16 223 lb (101.2 kg)

## 2017-01-05 NOTE — Progress Notes (Signed)
Oncology Nurse Navigator Documentation  Oncology Nurse Navigator Flowsheets 01/05/2017  Navigator Location [facility]  Navigator Encounter Type Other/Help Dr. Julien Nordmann with peer to peer review for MRI Liver.  I then updated authorization coordinator of completion and auth number [id number]  Treatment Phase Treatment  Barriers/Navigation Needs Coordination of Care  Interventions Coordination of Care  Coordination of Care Other  Acuity Level 2  Time Spent with Patient 30

## 2017-01-06 ENCOUNTER — Encounter: Payer: Self-pay | Admitting: *Deleted

## 2017-01-06 ENCOUNTER — Other Ambulatory Visit: Payer: 59

## 2017-01-06 DIAGNOSIS — M75112 Incomplete rotator cuff tear or rupture of left shoulder, not specified as traumatic: Secondary | ICD-10-CM | POA: Diagnosis not present

## 2017-01-06 DIAGNOSIS — Z4789 Encounter for other orthopedic aftercare: Secondary | ICD-10-CM | POA: Diagnosis not present

## 2017-01-06 DIAGNOSIS — M25512 Pain in left shoulder: Secondary | ICD-10-CM | POA: Diagnosis not present

## 2017-01-07 ENCOUNTER — Ambulatory Visit
Admission: RE | Admit: 2017-01-07 | Discharge: 2017-01-07 | Disposition: A | Discharge status: Home / Self Care | Payer: 59 | Source: Ambulatory Visit | Attending: Radiation Oncology | Admitting: Radiation Oncology

## 2017-01-07 ENCOUNTER — Encounter: Payer: Self-pay | Admitting: Radiation Oncology

## 2017-01-07 ENCOUNTER — Other Ambulatory Visit: Payer: Self-pay | Admitting: Radiation Oncology

## 2017-01-07 VITALS — BP 154/90 | HR 97 | Temp 98.6°F | Resp 18 | Ht 73.0 in | Wt 223.0 lb

## 2017-01-07 DIAGNOSIS — C7931 Secondary malignant neoplasm of brain: Secondary | ICD-10-CM

## 2017-01-07 DIAGNOSIS — C3492 Malignant neoplasm of unspecified part of left bronchus or lung: Secondary | ICD-10-CM

## 2017-01-07 DIAGNOSIS — C7949 Secondary malignant neoplasm of other parts of nervous system: Principal | ICD-10-CM

## 2017-01-07 DIAGNOSIS — C3432 Malignant neoplasm of lower lobe, left bronchus or lung: Secondary | ICD-10-CM | POA: Diagnosis not present

## 2017-01-07 DIAGNOSIS — Z510 Encounter for antineoplastic radiation therapy: Secondary | ICD-10-CM | POA: Diagnosis not present

## 2017-01-07 MED ORDER — LORAZEPAM 0.5 MG PO TABS: ORAL_TABLET | ORAL | 0 refills | Status: DC

## 2017-01-07 MED ORDER — HYDROCOD POLST-CPM POLST ER 10-8 MG/5ML PO SUER: 5.0000 mL | Freq: Two times a day (BID) | ORAL | 0 refills | Status: DC | PRN

## 2017-01-07 NOTE — Progress Notes (Signed)
Radiation Oncology         [phone] ________________________________  Follow Up Outpatient  Name: Calvin Wagner MRN: [id number]  Date: 01/07/2017  DOB: 04/03/1966  DS:KAJGOT, Marjory Lies, MD  Gery Pray, MD   REFERRING PHYSICIAN: Gery Pray, MD  DIAGNOSIS: The encounter diagnosis was Secondary malignant neoplasm of brain and spinal cord ([facility]).    ICD-9-CM ICD-10-CM   1. 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   HISTORY OF PRESENT ILLNESS:Calvin Wagner is a 51 y.o. male was nonsmoker with history of pulmonary nodules identified on CT imaging last year. He was followed and a CT of the chest in December 2017 showed the nodules in the left lower lobe measured 0.7 x 1.0 cm. However, there was interval increase in size of the left hilar node measuring 2.2 x 2.5 cm and development of a left infrahilar node measuring 1.9 x 2 cm with significant focal impression and possible invasion of the proximal left lower lobe bronchus. PET scan vs left bronchoscopic node sample were recommended. In February 2018, he underwent rotator cuff repair and also complained of shortness of breath, he was found on diagnostic CTPA to have  multiple bilateral acute pulmonary emboli, segmental to subsegmental size. There was also progressive left hilar mass/adenopathy consistent with bronchogenic carcinoma. There is complete obstruction of the lingular and left lower lobe airways. There was also a 1.0 cm pulmonary nodule in the left lateral costophrenic sulcus. He was put on anticoagulation.   On 12/10/2016 bronchoscopy was performed and revealed poorly differentiated high-grade neuroendocrine carcinoma. There was insufficient material for molecular studies. PET scan on 12/17/2016 showed extensive left hilar and mediastinal  hypermetabolic lymphadenopathy compatible with the underlying malignancy including a left infrahilar and hilar regions. There was also subcarinal lymphadenopathy measuring 1.0 cm in short axis which was hypermetabolic with SUV max of 7.6. There was also a small left lower lobe pulmonary nodule measuring 1.1 cm which is mildly hypermetabolic and this could potentially represent the primary bronchogenic neoplasm. There was no evidence for extrathoracic metastatic disease. MRI of the brain and MRI of the liver. Since there was insufficient material for molecular studies, Dr. Julien Nordmann recommended a blood test by [facility] for mutations. The result of this is pending. Treatments and dose to the chest will be limited  (~2 weeks) if he was found to have metastatic disease to the liver or brain. MRI of the brain on 01/02/17 showed a solitary 1.3 cm rim enhancing metastasis in the left anterior inferior frontal gyrus with no surrounding edema or mass effect. He is planning concurrent chemo/radiation to begin next Monday and Dr. Sondra Come is treating his chest. He comes today to discuss options for SRS to the brain with Dr. Lisbeth Renshaw.  PREVIOUS RADIATION THERAPY: No Past Medical History:  Past Medical History:  Diagnosis Date  . Encounter for antineoplastic chemotherapy 12/31/2016  . GERD 11/08/2009   Qualifier: Diagnosis of  By: Ronnald Ramp MD, Arvid Right Goals of care, counseling/discussion 12/31/2016  . HYPERTENSION 11/08/2009   Qualifier: Diagnosis of  By: Ronnald Ramp MD, Arvid Right.   . Migraine 02/25/2012  . Pneumonia   . PONV (postoperative nausea and vomiting)   . Rotator cuff tear, left     Past Surgical History: Past Surgical History:  Procedure Laterality Date  .  INGUINAL HERNIA REPAIR    . SHOULDER ARTHROSCOPY WITH ROTATOR CUFF REPAIR Left 11/12/2016   Procedure: SHOULDER ARTHROSCOPY WITH ROTATOR CUFF REPAIR AND SUBACROMIAL DECOMPRESSION;  Surgeon: Tania Ade, MD;  Location: [facility];  Service: Orthopedics;   Laterality: Left;  SHOULDER ARTHROSCOPY WITH ROTATOR CUFF REPAIR AND SUBACROMIAL DECOMPRESSION  . TONSILLECTOMY    . TOTAL KNEE ARTHROPLASTY    . VIDEO BRONCHOSCOPY Bilateral 12/10/2016   Procedure: VIDEO BRONCHOSCOPY WITHOUT FLUORO;  Surgeon: Tanda Rockers, MD;  Location: [facility];  Service: Cardiopulmonary;  Laterality: Bilateral;    Social History:  Social History   Social History  . Marital status: Married    Spouse name: Baker Janus  . Number of children: 2  . Years of education: N/A   Occupational History  . Material [employer]   Social History Main Topics  . Smoking status: Never Smoker  . Smokeless tobacco: Never Used  . Alcohol use Yes     Comment: social  . Drug use: No  . Sexual activity: Not Currently   Other Topics Concern  . Not on file   Social History Narrative   Lives at home wife and 2 kids   Caffeine use: none     He works for [employer] in [place] and lives in [place].  Family History: Family History  Problem Relation Age of Onset  . Heart disease Mother 75  . Lung cancer      family history  . CAD      strong family history male and male <50  . Mental retardation    . Heart attack Brother 83  . Alcohol abuse Neg Hx   . Diabetes Neg Hx   . Early death Neg Hx   . Hyperlipidemia Neg Hx   . Hypertension Neg Hx   . Kidney disease Neg Hx   . Stroke Neg Hx   . Migraines Neg Hx     ALLERGIES: No known allergies  MEDICATIONS:  Current Outpatient Prescriptions  Medication Sig Dispense Refill  . amLODipine (NORVASC) 10 MG tablet Take 1 tablet (10 mg total) by mouth daily. 30 tablet 0  . budesonide-formoterol (SYMBICORT) 80-4.5 MCG/ACT inhaler Inhale 2 puffs into the lungs 2 (two) times daily. 1 Inhaler 5  . enoxaparin (LOVENOX) 150 MG/ML injection Inject 1.02 mLs (155 mg total) into the skin daily. 30 mL 2  . Multiple Vitamin (MULTIVITAMIN WITH MINERALS) TABS tablet Take 1 tablet by mouth daily.    . pantoprazole  (PROTONIX) 40 MG tablet Take 40 mg by mouth daily.  2  . chlorpheniramine-HYDROcodone (TUSSIONEX) 10-8 MG/5ML SUER Take 5 mLs by mouth every 12 (twelve) hours as needed for cough. 140 mL 0  . LORazepam (ATIVAN) 0.5 MG tablet 1 tablet po q 4-6 hours prn anxiety and one po 30 minutes prior to radiation or MRI 30 tablet 0  . prochlorperazine (COMPAZINE) 10 MG tablet Take 1 tablet (10 mg total) by mouth every 6 (six) hours as needed for nausea or vomiting. (Patient not taking: Reported on 01/07/2017) 30 tablet 0   No current facility-administered medications for this encounter.     REVIEW OF SYSTEMS: On review of systems, he reports a dry cough, SOB, and pain with deep inspiration. He denies hemoptysis. He reports having migraines his whole life, but none at this time. He reports a decrease in appetite. The patient has left shoulder pain from rotator cuff surgery performed in January 2018. He has some limited ROM of the left arm.  The patient reports being unable to work due to fatigue and his SOB and recent surgery to his left rotator cuff. The patient's cough makes it difficult for him to sleep. He denies any cognitive or neurological defects. He denies vision changes. He denies chills, night sweats, unintended weight changes. He denies any bowel or bladder disturbances, and denies abdominal pain, or vomiting. He denies any new musculoskeletal or joint aches or pains, new skin lesions or concerns. A complete review of systems is obtained and is otherwise negative.    PHYSICAL EXAM:  Vitals:   01/07/17 1115  BP: (!) 154/90  Pulse: 97  Resp: 18  Temp: 98.6 F (37 C)  TempSrc: Oral  SpO2: 99%  Weight: 223 lb (101.2 kg)  Height: '6\' 1"'$  (1.854 m)   In general this is a well appearing Caucasian malein no acute distress. He is alert and oriented x4 and appropriate throughout the examination. HEENT reveals that the patient is normocephalic, atraumatic. EOMs are intact. PERRLA. Skin is intact without any  evidence of gross lesions. Cardiovascular exam reveals a regular rate and rhythm, no clicks rubs or murmurs are auscultated. Chest is clear to auscultation bilaterally. Lymphatic assessment is performed and does not reveal any adenopathy in the cervical, supraclavicular, axillary, or inguinal chains. Abdomen has active bowel sounds in all quadrants and is intact. The abdomen is soft, non tender, non distended. Lower extremities are negative for pretibial pitting edema, deep calf tenderness, cyanosis or clubbing.    KPS = 80  LABORATORY DATA:  Lab Results  Component Value Date   WBC 6.1 12/31/2016   HGB 14.5 12/31/2016   HCT 42.7 12/31/2016   MCV 91.6 12/31/2016   PLT 290 12/31/2016   Lab Results  Component Value Date   NA 139 12/31/2016   K 4.0 12/31/2016   CL 103 11/26/2016   CO2 26 12/31/2016   Lab Results  Component Value Date   ALT 29 12/31/2016   AST 14 12/31/2016   ALKPHOS 82 12/31/2016   BILITOT 0.37 12/31/2016    PULMONARY FUNCTION TEST:   Recent Review Flowsheet Data    There is no flowsheet data to display.      RADIOGRAPHY: Mr Jeri Cos Wo Contrast  Result Date: 01/02/2017 CLINICAL DATA:  51 year old male with recently diagnosed Large cell carcinoma of left lung, stage 3. Staging. Subsequent encounter. EXAM: MRI HEAD WITHOUT AND WITH CONTRAST TECHNIQUE: Multiplanar, multiecho pulse sequences of the brain and surrounding structures were obtained without and with intravenous contrast. CONTRAST:  17m MULTIHANCE GADOBENATE DIMEGLUMINE 529 MG/ML IV SOLN COMPARISON:  PET-CT 318. FINDINGS: Brain: 13 mm rim enhancing lesion in the anterior left inferior frontal gyrus, gyrus rectus (series 10, image 72). No surrounding edema or mass effect. No other abnormal brain enhancement. No restricted diffusion to suggest acute infarction. No midline shift, mass effect, ventriculomegaly, extra-axial collection or acute intracranial hemorrhage. Cervicomedullary junction and pituitary are  within normal limits. GPearline Cablesand white matter signal is within normal limits. Vascular: Major intracranial vascular flow voids are preserved. Skull and upper cervical spine: Negative visualized cervical spine and spinal cord. Visible bone marrow signal is normal. Sinuses/Orbits: Normal orbits soft tissues. Visualized paranasal sinuses and mastoids are well pneumatized. Other: Visible internal auditory structures appear normal. Negative scalp soft tissues. IMPRESSION: 1. 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 No associated edema or mass effect. 2. Otherwise negative MRI appearance of the brain. Electronically  Signed   By: Genevie Ann M.D.   On: 01/02/2017 16:52   Nm Pet Image Initial (pi) Skull Base To Thigh  Result Date: 12/17/2016 CLINICAL DATA:  Initial treatment strategy for pulmonary infiltrates. EXAM: NUCLEAR MEDICINE PET SKULL BASE TO THIGH TECHNIQUE: 13.08 mCi F-18 FDG was injected intravenously. Full-ring PET imaging was performed from the skull base to thigh after the radiotracer. CT data was obtained and used for attenuation correction and anatomic localization. FASTING BLOOD GLUCOSE:  Value: 99 mg/dl COMPARISON:  No prior PET-CT.  Chest CT 11/25/2016. FINDINGS: NECK No hypermetabolic lymph nodes in the neck. CHEST Previously described left lower lobe pulmonary nodule currently measures 1.1 cm (axial image 136 of series 3) and is mildly hypermetabolic (SUVmax = 3.9). Compared to the recent prior examination, the dense consolidative changes noted in the left lower lobe have partially resolved, predominantly with some residual ground-glass attenuation in its wake, much of which is nodular in appearance. This nodular ground-glass attenuation does demonstrate some hypermetabolism, most notable in the superior segment of the left lower lobe (SUVmax = 4.5- 6.1), which is presumably indicative of resolving infection/inflammation.  Extensive bulky soft tissue and hypermetabolism is noted in the left infrahilar and hilar regions. Discrete lymph nodes are difficult to accurately measure on the noncontrast portion of today's examination, however, this tissue is markedly hypermetabolic (SUVmax = 9.9-37.1). There is also subcarinal lymphadenopathy measuring 1 cm in short axis which is hypermetabolic (SUVmax = 7.6) and a tiny nonenlarged superior mediastinal lymph node posterior to the proximal trachea measuring 5 mm (axial image 82 of series 3) which is hypermetabolic (SUVmax = 3.5). Trace left pleural effusion lying dependently. Some cylindrical bronchiectasis and thickening of the peribronchovascular interstitium is noted throughout the left lower lobe. ABDOMEN/PELVIS In segment 5 of the liver there is a small focus of potential mild hypermetabolism (SUVmax = 5.7)which stands out slightly against the background activity in the hepatic parenchyma (SUVmax = 4.1), which could reflect a solitary hepatic metastasis. No discrete hepatic lesion is confidently identified in this region on the noncontrast CT images. No abnormal hypermetabolic activity within the pancreas, adrenal glands, or spleen. No hypermetabolic lymph nodes in the abdomen or pelvis. No significant volume of ascites. No pneumoperitoneum. Aortic atherosclerosis. Status post bilateral inguinal herniorrhaphy. SKELETON No focal hypermetabolic activity to suggest skeletal metastasis. IMPRESSION: 1. Extensive left hilar and mediastinal all hypermetabolic lymphadenopathy, compatible with underlying malignancy. These findings could reflect either primary lymphoma or bronchogenic carcinoma such as a small cell carcinoma. 2. There is also a small left lower lobe pulmonary nodule measuring 11 mm which is mildly hypermetabolic. This could potentially represent the primary bronchogenic neoplasm, but it is un usual for a small nodule of this size to result in such extensive lymphadenopathy. 3.  Previously noted areas of consolidation in the left lower lobe have significantly improved compared to the prior studies, and although some of the residual ground-glass attenuation in the left lower lobe does demonstrate some hypermetabolism, this is favored to reflect an underlying resolving infectious/inflammatory process. 4. Trace left pleural effusion lying dependently. 5. Aortic atherosclerosis. Electronically Signed   By: Vinnie Langton M.D.   On: 12/17/2016 15:53      IMPRESSION/PLAN:  1. 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 Dr. Lisbeth Renshaw reviews the findings from the work up thus far including his plans to undergo concurrent chemotherapy and radiation. We reviewed the rationale for a 3T MRI of  the brain is scheduled the same day. We discussed the risks, benefits, short, and long term effects of radiotherapy, and the patient is interested in proceeding. Dr. Lisbeth Renshaw discusses the delivery and logistics of radiotherapy and would offer one fraction of treatment. He is interested in proceeding.  SRS CT simulation is scheduled on 01/20/17. SRS will be conducted with the guidance of Dr. Kathyrn Sheriff, but we anticipate treatment on 01/25/17. 2. Persistent cough. I have given the patient a new prescription for Tussionex.  3. Claustrophobia. We discussed the use of Ativan for the patient take prior to his brain scans and SRS treatment. Dr. Julien Nordmann will continue management of the patient's Lovenox.   The above documentation reflects my direct findings during this shared patient visit. Please see the separate note by Dr. Lisbeth Renshaw on this date for the remainder of the patient's plan of care.   Carola Rhine, PAC  This document serves as a record of services personally performed by Shona Simpson, PA-C and Kyung Rudd, MD. It was created on their behalf by Darcus Austin, a trained medical scribe. The creation of this  record is based on the scribe's personal observations and the providers' statements to them. This document has been checked and approved by the attending provider.

## 2017-01-07 NOTE — Progress Notes (Signed)
Please see the Nurse Progress Note in the MD Initial Consult Encounter for this patient. 

## 2017-01-11 ENCOUNTER — Encounter: Payer: Self-pay | Admitting: *Deleted

## 2017-01-11 ENCOUNTER — Telehealth: Payer: Self-pay | Admitting: Oncology

## 2017-01-11 ENCOUNTER — Ambulatory Visit (HOSPITAL_BASED_OUTPATIENT_CLINIC_OR_DEPARTMENT_OTHER): Payer: 59

## 2017-01-11 ENCOUNTER — Telehealth: Payer: Self-pay | Admitting: *Deleted

## 2017-01-11 ENCOUNTER — Other Ambulatory Visit (HOSPITAL_BASED_OUTPATIENT_CLINIC_OR_DEPARTMENT_OTHER): Payer: 59

## 2017-01-11 ENCOUNTER — Other Ambulatory Visit: Payer: Self-pay | Admitting: Radiation Oncology

## 2017-01-11 ENCOUNTER — Ambulatory Visit (HOSPITAL_BASED_OUTPATIENT_CLINIC_OR_DEPARTMENT_OTHER): Payer: 59 | Admitting: Nurse Practitioner

## 2017-01-11 VITALS — BP 147/84 | HR 98 | Temp 98.7°F | Resp 18

## 2017-01-11 DIAGNOSIS — T451X5A Adverse effect of antineoplastic and immunosuppressive drugs, initial encounter: Secondary | ICD-10-CM

## 2017-01-11 DIAGNOSIS — C7A1 Malignant poorly differentiated neuroendocrine tumors: Secondary | ICD-10-CM

## 2017-01-11 DIAGNOSIS — Z86711 Personal history of pulmonary embolism: Secondary | ICD-10-CM

## 2017-01-11 DIAGNOSIS — R0789 Other chest pain: Secondary | ICD-10-CM | POA: Diagnosis not present

## 2017-01-11 DIAGNOSIS — Z5111 Encounter for antineoplastic chemotherapy: Secondary | ICD-10-CM

## 2017-01-11 DIAGNOSIS — C3492 Malignant neoplasm of unspecified part of left bronchus or lung: Secondary | ICD-10-CM | POA: Diagnosis not present

## 2017-01-11 DIAGNOSIS — H538 Other visual disturbances: Secondary | ICD-10-CM | POA: Diagnosis not present

## 2017-01-11 DIAGNOSIS — T7840XA Allergy, unspecified, initial encounter: Secondary | ICD-10-CM

## 2017-01-11 DIAGNOSIS — Z7901 Long term (current) use of anticoagulants: Secondary | ICD-10-CM

## 2017-01-11 LAB — CBC WITH DIFFERENTIAL/PLATELET
BASO%: 0.4 % (ref 0.0–2.0)
Basophils Absolute: 0 10*3/uL (ref 0.0–0.1)
EOS%: 2.2 % (ref 0.0–7.0)
Eosinophils Absolute: 0.1 10*3/uL (ref 0.0–0.5)
HCT: 45.7 % (ref 38.4–49.9)
HGB: 15.8 g/dL (ref 13.0–17.1)
LYMPH%: 23.4 % (ref 14.0–49.0)
MCH: 31.3 pg (ref 27.2–33.4)
MCHC: 34.6 g/dL (ref 32.0–36.0)
MCV: 90.5 fL (ref 79.3–98.0)
MONO#: 0.4 10*3/uL (ref 0.1–0.9)
MONO%: 7.9 % (ref 0.0–14.0)
NEUT#: 3.3 10*3/uL (ref 1.5–6.5)
NEUT%: 66.1 % (ref 39.0–75.0)
Platelets: 282 10*3/uL (ref 140–400)
RBC: 5.05 10*6/uL (ref 4.20–5.82)
RDW: 13.1 % (ref 11.0–14.6)
WBC: 5 10*3/uL (ref 4.0–10.3)
lymph#: 1.2 10*3/uL (ref 0.9–3.3)

## 2017-01-11 LAB — COMPREHENSIVE METABOLIC PANEL
ALT: 25 U/L (ref 0–55)
AST: 12 U/L (ref 5–34)
Albumin: 4.3 g/dL (ref 3.5–5.0)
Alkaline Phosphatase: 88 U/L (ref 40–150)
Anion Gap: 12 mEq/L — ABNORMAL HIGH (ref 3–11)
BUN: 11.3 mg/dL (ref 7.0–26.0)
CO2: 24 mEq/L (ref 22–29)
Calcium: 10.3 mg/dL (ref 8.4–10.4)
Chloride: 106 mEq/L (ref 98–109)
Creatinine: 1 mg/dL (ref 0.7–1.3)
EGFR: 83 mL/min/{1.73_m2} — ABNORMAL LOW (ref >90)
Glucose: 124 mg/dl (ref 70–140)
Potassium: 3.9 mEq/L (ref 3.5–5.1)
Sodium: 141 mEq/L (ref 136–145)
Total Bilirubin: 0.42 mg/dL (ref 0.20–1.20)
Total Protein: 7.9 g/dL (ref 6.4–8.3)

## 2017-01-11 MED ORDER — PACLITAXEL CHEMO INJECTION 300 MG/50ML
45.0000 mg/m2 | Freq: Once | INTRAVENOUS | Status: AC
  Administered 2017-01-11: 102 mg via INTRAVENOUS
  Filled 2017-01-11: qty 17

## 2017-01-11 MED ORDER — HYDROCOD POLST-CPM POLST ER 10-8 MG/5ML PO SUER: 5.0000 mL | Freq: Two times a day (BID) | ORAL | 0 refills | Status: DC | PRN

## 2017-01-11 MED ORDER — SODIUM CHLORIDE 0.9 % IV SOLN
Freq: Once | INTRAVENOUS | Status: AC
  Administered 2017-01-11: 11:00:00 via INTRAVENOUS

## 2017-01-11 MED ORDER — DIPHENHYDRAMINE HCL 50 MG/ML IJ SOLN
INTRAMUSCULAR | Status: AC
  Filled 2017-01-11: qty 1

## 2017-01-11 MED ORDER — PALONOSETRON HCL INJECTION 0.25 MG/5ML
INTRAVENOUS | Status: AC
  Filled 2017-01-11: qty 5

## 2017-01-11 MED ORDER — SODIUM CHLORIDE 0.9 % IV SOLN
281.6000 mg | Freq: Once | INTRAVENOUS | Status: AC
  Administered 2017-01-11: 280 mg via INTRAVENOUS
  Filled 2017-01-11: qty 28

## 2017-01-11 MED ORDER — PALONOSETRON HCL INJECTION 0.25 MG/5ML
0.2500 mg | Freq: Once | INTRAVENOUS | Status: AC
Start: 2017-01-11 — End: 2017-01-11
  Administered 2017-01-11: 0.25 mg via INTRAVENOUS

## 2017-01-11 MED ORDER — FAMOTIDINE IN NACL 20-0.9 MG/50ML-% IV SOLN
INTRAVENOUS | Status: AC
  Filled 2017-01-11: qty 50

## 2017-01-11 MED ORDER — DEXAMETHASONE SODIUM PHOSPHATE 100 MG/10ML IJ SOLN
20.0000 mg | Freq: Once | INTRAMUSCULAR | Status: AC
  Administered 2017-01-11: 20 mg via INTRAVENOUS
  Filled 2017-01-11: qty 2

## 2017-01-11 MED ORDER — METHYLPREDNISOLONE SODIUM SUCC 125 MG IJ SOLR
125.0000 mg | Freq: Once | INTRAMUSCULAR | Status: AC | PRN
  Administered 2017-01-11: 125 mg via INTRAVENOUS

## 2017-01-11 MED ORDER — FAMOTIDINE IN NACL 20-0.9 MG/50ML-% IV SOLN
20.0000 mg | Freq: Once | INTRAVENOUS | Status: AC
  Administered 2017-01-11: 20 mg via INTRAVENOUS

## 2017-01-11 MED ORDER — DIPHENHYDRAMINE HCL 50 MG/ML IJ SOLN
50.0000 mg | Freq: Once | INTRAMUSCULAR | Status: AC
  Administered 2017-01-11: 50 mg via INTRAVENOUS

## 2017-01-11 NOTE — Progress Notes (Signed)
We called the patient's pharmacy and his medication

## 2017-01-11 NOTE — Progress Notes (Signed)
Oncology Nurse Navigator Documentation  Oncology Nurse Navigator Flowsheets 01/11/2017  Navigator Location [facility]  Navigator Encounter Type Other/I called [facility] to inquire about results. Testing is not complete yet.  I will updated Dr. Julien Nordmann.   Treatment Phase Treatment  Barriers/Navigation Needs Coordination of Care  Interventions Coordination of Care  Coordination of Care Other  Acuity Level 2  Time Spent with Patient 15

## 2017-01-11 NOTE — Telephone Encounter (Signed)
Jerene Pitch called stating that the patient scheduled Saturday 01/16/17 for MRI brain and MRI Liver cannot be back to back with contrast, has to be 48 hours apart, per Shona Simpson, do the stat MRI Brain first, called MRI they can schedule MRI Liver Tuesday  01/19/17  Per patient preference, at 4pm, arrive 345pm, and nothing by mouth 4 hours prior, thanked scheduler, printed new schedule and took back up to med/onc where patient is having chemotherapy infusion / 11:31 AM

## 2017-01-11 NOTE — Patient Instructions (Signed)
[facility] Discharge Instructions for Patients Receiving Chemotherapy  Today you received the following chemotherapy agents Taxol and Carboplatin   To help prevent nausea and vomiting after your treatment, we encourage you to take your nausea medication as directed. If you develop nausea and vomiting that is not controlled by your nausea medication, call the clinic.   BELOW ARE SYMPTOMS THAT SHOULD BE REPORTED IMMEDIATELY:  *FEVER GREATER THAN 100.5 F  *CHILLS WITH OR WITHOUT FEVER  NAUSEA AND VOMITING THAT IS NOT CONTROLLED WITH YOUR NAUSEA MEDICATION  *UNUSUAL SHORTNESS OF BREATH  *UNUSUAL BRUISING OR BLEEDING  TENDERNESS IN MOUTH AND THROAT WITH OR WITHOUT PRESENCE OF ULCERS  *URINARY PROBLEMS  *BOWEL PROBLEMS  UNUSUAL RASH Items with * indicate a potential emergency and should be followed up as soon as possible.  Feel free to call the clinic you have any questions or concerns. The clinic phone number is [phone].  Please show the CHEMO ALERT CARD at check-in to the Emergency Department and triage nurse.  Paclitaxel injection What is this medicine? PACLITAXEL (PAK li TAX el) is a chemotherapy drug. It targets fast dividing cells, like cancer cells, and causes these cells to die. This medicine is used to treat ovarian cancer, breast cancer, and other cancers. This medicine may be used for other purposes; ask your health care provider or pharmacist if you have questions. COMMON BRAND NAME(S): Onxol, Taxol What should I tell my health care provider before I take this medicine? They need to know if you have any of these conditions: -blood disorders -irregular heartbeat -infection (especially a virus infection such as chickenpox, cold sores, or herpes) -liver disease -previous or ongoing radiation therapy -an unusual or allergic reaction to paclitaxel, alcohol, polyoxyethylated castor oil, other chemotherapy agents, other medicines, foods, dyes, or  preservatives -pregnant or trying to get pregnant -breast-feeding How should I use this medicine? This drug is given as an infusion into a vein. It is administered in a hospital or clinic by a specially trained health care professional. Talk to your pediatrician regarding the use of this medicine in children. Special care may be needed. Overdosage: If you think you have taken too much of this medicine contact a poison control center or emergency room at once. NOTE: This medicine is only for you. Do not share this medicine with others. What if I miss a dose? It is important not to miss your dose. Call your doctor or health care professional if you are unable to keep an appointment. What may interact with this medicine? Do not take this medicine with any of the following medications: -disulfiram -metronidazole This medicine may also interact with the following medications: -cyclosporine -diazepam -ketoconazole -medicines to increase blood counts like filgrastim, pegfilgrastim, sargramostim -other chemotherapy drugs like cisplatin, doxorubicin, epirubicin, etoposide, teniposide, vincristine -quinidine -testosterone -vaccines -verapamil Talk to your doctor or health care professional before taking any of these medicines: -acetaminophen -aspirin -ibuprofen -ketoprofen -naproxen This list may not describe all possible interactions. Give your health care provider a list of all the medicines, herbs, non-prescription drugs, or dietary supplements you use. Also tell them if you smoke, drink alcohol, or use illegal drugs. Some items may interact with your medicine. What should I watch for while using this medicine? Your condition will be monitored carefully while you are receiving this medicine. You will need important blood work done while you are taking this medicine. This medicine can cause serious allergic reactions. To reduce your risk you will need to take   other medicine(s) before  treatment with this medicine. If you experience allergic reactions like skin rash, itching or hives, swelling of the face, lips, or tongue, tell your doctor or health care professional right away. In some cases, you may be given additional medicines to help with side effects. Follow all directions for their use. This drug may make you feel generally unwell. This is not uncommon, as chemotherapy can affect healthy cells as well as cancer cells. Report any side effects. Continue your course of treatment even though you feel ill unless your doctor tells you to stop. Call your doctor or health care professional for advice if you get a fever, chills or sore throat, or other symptoms of a cold or flu. Do not treat yourself. This drug decreases your body's ability to fight infections. Try to avoid being around people who are sick. This medicine may increase your risk to bruise or bleed. Call your doctor or health care professional if you notice any unusual bleeding. Be careful brushing and flossing your teeth or using a toothpick because you may get an infection or bleed more easily. If you have any dental work done, tell your dentist you are receiving this medicine. Avoid taking products that contain aspirin, acetaminophen, ibuprofen, naproxen, or ketoprofen unless instructed by your doctor. These medicines may hide a fever. Do not become pregnant while taking this medicine. Women should inform their doctor if they wish to become pregnant or think they might be pregnant. There is a potential for serious side effects to an unborn child. Talk to your health care professional or pharmacist for more information. Do not breast-feed an infant while taking this medicine. Men are advised not to father a child while receiving this medicine. This product may contain alcohol. Ask your pharmacist or healthcare provider if this medicine contains alcohol. Be sure to tell all healthcare providers you are taking this medicine.  Certain medicines, like metronidazole and disulfiram, can cause an unpleasant reaction when taken with alcohol. The reaction includes flushing, headache, nausea, vomiting, sweating, and increased thirst. The reaction can last from 30 minutes to several hours. What side effects may I notice from receiving this medicine? Side effects that you should report to your doctor or health care professional as soon as possible: -allergic reactions like skin rash, itching or hives, swelling of the face, lips, or tongue -low blood counts - This drug may decrease the number of white blood cells, red blood cells and platelets. You may be at increased risk for infections and bleeding. -signs of infection - fever or chills, cough, sore throat, pain or difficulty passing urine -signs of decreased platelets or bleeding - bruising, pinpoint red spots on the skin, black, tarry stools, nosebleeds -signs of decreased red blood cells - unusually weak or tired, fainting spells, lightheadedness -breathing problems -chest pain -high or low blood pressure -mouth sores -nausea and vomiting -pain, swelling, redness or irritation at the injection site -pain, tingling, numbness in the hands or feet -slow or irregular heartbeat -swelling of the ankle, feet, hands Side effects that usually do not require medical attention (report to your doctor or health care professional if they continue or are bothersome): -bone pain -complete hair loss including hair on your head, underarms, pubic hair, eyebrows, and eyelashes -changes in the color of fingernails -diarrhea -loosening of the fingernails -loss of appetite -muscle or joint pain -red flush to skin -sweating This list may not describe all possible side effects. Call your doctor for medical advice about side   effects. You may report side effects to FDA at 1-800-FDA-1088. Where should I keep my medicine? This drug is given in a hospital or clinic and will not be stored at  home. NOTE: This sheet is a summary. It may not cover all possible information. If you have questions about this medicine, talk to your doctor, pharmacist, or health care provider.  2018 Elsevier/Gold Standard (2015-07-30 19:58:00)   Carboplatin injection What is this medicine? CARBOPLATIN (KAR boe pla tin) is a chemotherapy drug. It targets fast dividing cells, like cancer cells, and causes these cells to die. This medicine is used to treat ovarian cancer and many other cancers. This medicine may be used for other purposes; ask your health care provider or pharmacist if you have questions. COMMON BRAND NAME(S): Paraplatin What should I tell my health care provider before I take this medicine? They need to know if you have any of these conditions: -blood disorders -hearing problems -kidney disease -recent or ongoing radiation therapy -an unusual or allergic reaction to carboplatin, cisplatin, other chemotherapy, other medicines, foods, dyes, or preservatives -pregnant or trying to get pregnant -breast-feeding How should I use this medicine? This drug is usually given as an infusion into a vein. It is administered in a hospital or clinic by a specially trained health care professional. Talk to your pediatrician regarding the use of this medicine in children. Special care may be needed. Overdosage: If you think you have taken too much of this medicine contact a poison control center or emergency room at once. NOTE: This medicine is only for you. Do not share this medicine with others. What if I miss a dose? It is important not to miss a dose. Call your doctor or health care professional if you are unable to keep an appointment. What may interact with this medicine? -medicines for seizures -medicines to increase blood counts like filgrastim, pegfilgrastim, sargramostim -some antibiotics like amikacin, gentamicin, neomycin, streptomycin, tobramycin -vaccines Talk to your doctor or health  care professional before taking any of these medicines: -acetaminophen -aspirin -ibuprofen -ketoprofen -naproxen This list may not describe all possible interactions. Give your health care provider a list of all the medicines, herbs, non-prescription drugs, or dietary supplements you use. Also tell them if you smoke, drink alcohol, or use illegal drugs. Some items may interact with your medicine. What should I watch for while using this medicine? Your condition will be monitored carefully while you are receiving this medicine. You will need important blood work done while you are taking this medicine. This drug may make you feel generally unwell. This is not uncommon, as chemotherapy can affect healthy cells as well as cancer cells. Report any side effects. Continue your course of treatment even though you feel ill unless your doctor tells you to stop. In some cases, you may be given additional medicines to help with side effects. Follow all directions for their use. Call your doctor or health care professional for advice if you get a fever, chills or sore throat, or other symptoms of a cold or flu. Do not treat yourself. This drug decreases your body's ability to fight infections. Try to avoid being around people who are sick. This medicine may increase your risk to bruise or bleed. Call your doctor or health care professional if you notice any unusual bleeding. Be careful brushing and flossing your teeth or using a toothpick because you may get an infection or bleed more easily. If you have any dental work done, tell your dentist you   are receiving this medicine. Avoid taking products that contain aspirin, acetaminophen, ibuprofen, naproxen, or ketoprofen unless instructed by your doctor. These medicines may hide a fever. Do not become pregnant while taking this medicine. Women should inform their doctor if they wish to become pregnant or think they might be pregnant. There is a potential for serious  side effects to an unborn child. Talk to your health care professional or pharmacist for more information. Do not breast-feed an infant while taking this medicine. What side effects may I notice from receiving this medicine? Side effects that you should report to your doctor or health care professional as soon as possible: -allergic reactions like skin rash, itching or hives, swelling of the face, lips, or tongue -signs of infection - fever or chills, cough, sore throat, pain or difficulty passing urine -signs of decreased platelets or bleeding - bruising, pinpoint red spots on the skin, black, tarry stools, nosebleeds -signs of decreased red blood cells - unusually weak or tired, fainting spells, lightheadedness -breathing problems -changes in hearing -changes in vision -chest pain -high blood pressure -low blood counts - This drug may decrease the number of white blood cells, red blood cells and platelets. You may be at increased risk for infections and bleeding. -nausea and vomiting -pain, swelling, redness or irritation at the injection site -pain, tingling, numbness in the hands or feet -problems with balance, talking, walking -trouble passing urine or change in the amount of urine Side effects that usually do not require medical attention (report to your doctor or health care professional if they continue or are bothersome): -hair loss -loss of appetite -metallic taste in the mouth or changes in taste This list may not describe all possible side effects. Call your doctor for medical advice about side effects. You may report side effects to FDA at 1-800-FDA-1088. Where should I keep my medicine? This drug is given in a hospital or clinic and will not be stored at home. NOTE: This sheet is a summary. It may not cover all possible information. If you have questions about this medicine, talk to your doctor, pharmacist, or health care provider.  2018 Elsevier/Gold Standard (2008-01-03  14:38:05)   

## 2017-01-11 NOTE — Telephone Encounter (Signed)
Patient's wife called and said she was not able to fill [employer]'s Tussionex because they were told by [facility] that it was too early.  Call placed to [facility] by Shona Simpson, PA and was told that the prescription is ready for pick up.  Called Gail back and advised her that it is ready for pick up at [facility].

## 2017-01-11 NOTE — Progress Notes (Signed)
Pt reports red "blotches" on bilateral feet and ankles with some swelling that resolved after elevating feet twice in the past week. No s/s present at this time b/p 138/90, pt states this is normal when he is here and he has no s/s of distress at this time, pt educated to monitor b/p while at home and to monitor redness and swelling of ankles and feet and events surrounding these symptoms. Dr. Julien Nordmann aware, per Dr. Julien Nordmann okay to proceed with treatment, pt to monitor symptoms and notify clinic of any changes. Pt verbalizes understanding.   1205: Pt reports feeling hot and tightness in chest with breathing, facial flushing noted, Taxol stopped and NS given wide open, Selena Lesser NP aware and at chairside to assess pt. 1208: Redness has improved and pt states flushing and tightness has improved. 1209: '125mg'$  solumedrol IVP given per Cyndee Bacon's NP order. 1214: pt states symptoms has subsided at this time, redness has also subsided.  1235: VSS Selena Lesser NP at chairside, pt stable Taxol to be restarted per Selena Lesser NP.  Pt tolerated remainder of taxol and Carboplatin well. Pt and Patient's wife educated and given printed discharge instructions. Pt and patient's wife verbalizes understanding. Pt and VS stable at discharge.

## 2017-01-12 ENCOUNTER — Encounter: Payer: Self-pay | Admitting: Nurse Practitioner

## 2017-01-12 ENCOUNTER — Ambulatory Visit: Payer: 59 | Admitting: Radiation Oncology

## 2017-01-12 ENCOUNTER — Telehealth: Payer: Self-pay | Admitting: *Deleted

## 2017-01-12 ENCOUNTER — Telehealth: Payer: Self-pay | Admitting: Medical Oncology

## 2017-01-12 DIAGNOSIS — Z510 Encounter for antineoplastic radiation therapy: Secondary | ICD-10-CM | POA: Diagnosis not present

## 2017-01-12 DIAGNOSIS — M25512 Pain in left shoulder: Secondary | ICD-10-CM | POA: Diagnosis not present

## 2017-01-12 DIAGNOSIS — T7840XA Allergy, unspecified, initial encounter: Secondary | ICD-10-CM | POA: Insufficient documentation

## 2017-01-12 DIAGNOSIS — C3490 Malignant neoplasm of unspecified part of unspecified bronchus or lung: Secondary | ICD-10-CM | POA: Diagnosis not present

## 2017-01-12 DIAGNOSIS — C3432 Malignant neoplasm of lower lobe, left bronchus or lung: Secondary | ICD-10-CM | POA: Diagnosis not present

## 2017-01-12 DIAGNOSIS — Z4789 Encounter for other orthopedic aftercare: Secondary | ICD-10-CM | POA: Diagnosis not present

## 2017-01-12 DIAGNOSIS — C7931 Secondary malignant neoplasm of brain: Secondary | ICD-10-CM | POA: Diagnosis not present

## 2017-01-12 DIAGNOSIS — C7949 Secondary malignant neoplasm of other parts of nervous system: Secondary | ICD-10-CM | POA: Diagnosis not present

## 2017-01-12 DIAGNOSIS — M75112 Incomplete rotator cuff tear or rupture of left shoulder, not specified as traumatic: Secondary | ICD-10-CM | POA: Diagnosis not present

## 2017-01-12 DIAGNOSIS — C801 Malignant (primary) neoplasm, unspecified: Secondary | ICD-10-CM | POA: Diagnosis not present

## 2017-01-12 DIAGNOSIS — Z7901 Long term (current) use of anticoagulants: Secondary | ICD-10-CM | POA: Insufficient documentation

## 2017-01-12 NOTE — Assessment & Plan Note (Signed)
Patient has responded diagnosed with pulmonary embolisms; and continues to take Lovenox injections on a daily basis as directed.

## 2017-01-12 NOTE — Telephone Encounter (Signed)
I left message to return call re how he did after chemo .

## 2017-01-12 NOTE — Assessment & Plan Note (Signed)
Patient presented to the [facility] today to receive his first cycle of carboplatin/Taxol chemotherapy regimen.  He was in the midst of the Taxol infusion; when he experienced a mild hypersensitivity reaction which consisted of visual flashing in feeling of chest tightness.  The infusion was held; and medications were given per hypersensitivity protocol.  All symptoms resolve; and patient was able to proceed with his chemotherapy infusion as previously planned.

## 2017-01-12 NOTE — Telephone Encounter (Signed)
"  My husband is a new patient of Dr. Julien Nordmann.  We're filing a claim for a 'Critical Illness insurance form'.  I need to make sure the correct provider completes this form.  Dr. Julien Nordmann is his primary oncologist so I would like to drop this form off tomorrow when he's there for radiation at 12:30.   Dr. 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  There is a  Physician statement to sign.  Form asks for the TNM stage, Pathology, history, surgery for documentation to support the diagnosis.  I have asked the Hospital for records.  If I do not hear another provider who should complete this form, I will bring the form tomorrow."  Will notify provider.  Encouraged to leave form at front desk but "want to make sure t he form gets in the right person's hands".  H.I.M. Location provided to leave form with [facility] forms coordinator.  No further questions.

## 2017-01-12 NOTE — Assessment & Plan Note (Signed)
Patient presented to the [facility] today to receive cycle one of his carboplatin/Taxol chemotherapy regimen.  He experienced a mild hypersensitivity reaction to the Taxol infusion.  See further notes for details.  Patient is scheduled for brain MRI on 01/16/2017.  He is scheduled for labs, follow up visit, and his next cycle of chemotherapy on 01/18/2017.  He is scheduled for liver MRI on 01/19/2017.

## 2017-01-12 NOTE — Progress Notes (Signed)
SYMPTOM MANAGEMENT CLINIC    Chief Complaint: Hypersensitivity reaction  HPI:  Calvin Wagner 51 y.o. male diagnosed with lung cancer/neuroendocrine carcinoma.  Presented to the [facility] today to initiate of her cycle of carboplatin/Taxol chemotherapy regimen.    No history exists.    Review of Systems  Cardiovascular: Positive for chest pain.  Skin:       Facial flushing  All other systems reviewed and are negative.   Past Medical History:  Diagnosis Date  . Encounter for antineoplastic chemotherapy 12/31/2016  . GERD 11/08/2009   Qualifier: Diagnosis of  By: Ronnald Ramp MD, Arvid Right Goals of care, counseling/discussion 12/31/2016  . HYPERTENSION 11/08/2009   Qualifier: Diagnosis of  By: Ronnald Ramp MD, Arvid Right.   . Migraine 02/25/2012  . Pneumonia   . PONV (postoperative nausea and vomiting)   . Rotator cuff tear, left     Past Surgical History:  Procedure Laterality Date  . INGUINAL HERNIA REPAIR    . SHOULDER ARTHROSCOPY WITH ROTATOR CUFF REPAIR Left 11/12/2016   Procedure: SHOULDER ARTHROSCOPY WITH ROTATOR CUFF REPAIR AND SUBACROMIAL DECOMPRESSION;  Surgeon: Tania Ade, MD;  Location: [facility];  Service: Orthopedics;  Laterality: Left;  SHOULDER ARTHROSCOPY WITH ROTATOR CUFF REPAIR AND SUBACROMIAL DECOMPRESSION  . TONSILLECTOMY    . TOTAL KNEE ARTHROPLASTY    . VIDEO BRONCHOSCOPY Bilateral 12/10/2016   Procedure: VIDEO BRONCHOSCOPY WITHOUT FLUORO;  Surgeon: Tanda Rockers, MD;  Location: [facility];  Service: Cardiopulmonary;  Laterality: Bilateral;    has Essential hypertension; GERD; Other abnormal glucose; Chest pain; Alcohol dependence ([place]); Abdominal pain; Chest pain, atypical; Chest pain at rest; Headache; Worsening headaches; Vision changes; Snoring; Uncontrolled morning headache; Excessive somnolence disorder; Dizziness; Nausea with vomiting; Cough; Pulmonary infiltrates on CXR; Asthmatic bronchitis , chronic ([place]); Lung nodule; Dyspnea; Bronchial obstruction;  Obstructive pneumonia; Pulmonary embolus ([place]); Large cell carcinoma of left lung, stage 3 ([place]); Goals of care, counseling/discussion; Encounter for antineoplastic chemotherapy; Hypersensitivity reaction; and Long term current use of anticoagulant therapy on his problem list.    is allergic to no known allergies.  Allergies as of 01/11/2017      Reactions   No Known Allergies       Medication List       Accurate as of 01/11/17 11:59 PM. Always use your most recent med list.          amLODipine 10 MG tablet Commonly known as:  NORVASC Take 1 tablet (10 mg total) by mouth daily.   budesonide-formoterol 80-4.5 MCG/ACT inhaler Commonly known as:  SYMBICORT Inhale 2 puffs into the lungs 2 (two) times daily.   chlorpheniramine-HYDROcodone 10-8 MG/5ML Suer Commonly known as:  TUSSIONEX Take 5 mLs by mouth every 12 (twelve) hours as needed for cough.   enoxaparin 150 MG/ML injection Commonly known as:  LOVENOX Inject 1.02 mLs (155 mg total) into the skin daily.   LORazepam 0.5 MG tablet Commonly known as:  ATIVAN 1 tablet po q 4-6 hours prn anxiety and one po 30 minutes prior to radiation or MRI   multivitamin with minerals Tabs tablet Take 1 tablet by mouth daily.   pantoprazole 40 MG tablet Commonly known as:  PROTONIX Take 40 mg by mouth daily.   prochlorperazine 10 MG tablet Commonly known as:  COMPAZINE Take 1 tablet (10 mg total) by mouth every 6 (six) hours as needed for nausea or vomiting.        PHYSICAL EXAMINATION  Oncology Vitals 01/11/2017 01/11/2017  Height - -  Weight - -  Weight (lbs) - -  BMI (kg/m2) - -  Temp 98.7 98.6  Pulse 98 88  Resp 18 18  SpO2 100 98  BSA (m2) - -   BP Readings from Last 2 Encounters:  01/11/17 (!) 147/84  01/07/17 (!) 154/90    Physical Exam  Constitutional: He is oriented to person, place, and time and well-developed, well-nourished, and in no distress.  HENT:  Head: Normocephalic and atraumatic.  Mouth/Throat:  Oropharynx is clear and moist.  Eyes: Conjunctivae and EOM are normal. Pupils are equal, round, and reactive to light. Right eye exhibits no discharge. Left eye exhibits no discharge. No scleral icterus.  Neck: Normal range of motion. Neck supple. No JVD present. No tracheal deviation present. No thyromegaly present.  Cardiovascular: Normal rate, regular rhythm, normal heart sounds and intact distal pulses.   Pulmonary/Chest: Effort normal and breath sounds normal. No respiratory distress. He has no wheezes. He has no rales. He exhibits no tenderness.  Abdominal: Soft. Bowel sounds are normal. He exhibits no distension and no mass. There is no tenderness. There is no rebound and no guarding.  Musculoskeletal: Normal range of motion. He exhibits no edema, tenderness or deformity.  Lymphadenopathy:    He has no cervical adenopathy.  Neurological: He is alert and oriented to person, place, and time. Gait normal.  Skin: Skin is warm and dry. No rash noted. No erythema. No pallor.  Psychiatric: Affect normal.  Nursing note and vitals reviewed.   LABORATORY DATA:. Appointment on 01/11/2017  Component Date Value Ref Range Status  . WBC 01/11/2017 5.0  4.0 - 10.3 10e3/uL Final  . NEUT# 01/11/2017 3.3  1.5 - 6.5 10e3/uL Final  . HGB 01/11/2017 15.8  13.0 - 17.1 g/dL Final  . HCT 01/11/2017 45.7  38.4 - 49.9 % Final  . Platelets 01/11/2017 282  140 - 400 10e3/uL Final  . MCV 01/11/2017 90.5  79.3 - 98.0 fL Final  . MCH 01/11/2017 31.3  27.2 - 33.4 pg Final  . MCHC 01/11/2017 34.6  32.0 - 36.0 g/dL Final  . RBC 01/11/2017 5.05  4.20 - 5.82 10e6/uL Final  . RDW 01/11/2017 13.1  11.0 - 14.6 % Final  . lymph# 01/11/2017 1.2  0.9 - 3.3 10e3/uL Final  . MONO# 01/11/2017 0.4  0.1 - 0.9 10e3/uL Final  . Eosinophils Absolute 01/11/2017 0.1  0.0 - 0.5 10e3/uL Final  . Basophils Absolute 01/11/2017 0.0  0.0 - 0.1 10e3/uL Final  . NEUT% 01/11/2017 66.1  39.0 - 75.0 % Final  . LYMPH% 01/11/2017 23.4  14.0  - 49.0 % Final  . MONO% 01/11/2017 7.9  0.0 - 14.0 % Final  . EOS% 01/11/2017 2.2  0.0 - 7.0 % Final  . BASO% 01/11/2017 0.4  0.0 - 2.0 % Final  . Sodium 01/11/2017 141  136 - 145 mEq/L Final  . Potassium 01/11/2017 3.9  3.5 - 5.1 mEq/L Final  . Chloride 01/11/2017 106  98 - 109 mEq/L Final  . CO2 01/11/2017 24  22 - 29 mEq/L Final  . Glucose 01/11/2017 124  70 - 140 mg/dl Final  . BUN 01/11/2017 11.3  7.0 - 26.0 mg/dL Final  . Creatinine 01/11/2017 1.0  0.7 - 1.3 mg/dL Final  . Total Bilirubin 01/11/2017 0.42  0.20 - 1.20 mg/dL Final  . Alkaline Phosphatase 01/11/2017 88  40 - 150 U/L Final  . AST 01/11/2017 12  5 - 34 U/L Final  . ALT 01/11/2017 25  0 - 55 U/L Final  . Total Protein 01/11/2017 7.9  6.4 - 8.3 g/dL Final  . Albumin 01/11/2017 4.3  3.5 - 5.0 g/dL Final  . Calcium 01/11/2017 10.3  8.4 - 10.4 mg/dL Final  . Anion Gap 01/11/2017 12* 3 - 11 mEq/L Final  . EGFR 01/11/2017 83* >90 ml/min/1.73 m2 Final    RADIOGRAPHIC STUDIES: No results found.  ASSESSMENT/PLAN:    Large cell carcinoma of left lung, stage 3 ([place]) Patient presented to the [facility] today to receive cycle one of his carboplatin/Taxol chemotherapy regimen.  He experienced a mild hypersensitivity reaction to the Taxol infusion.  See further notes for details.  Patient is scheduled for brain MRI on 01/16/2017.  He is scheduled for labs, follow up visit, and his next cycle of chemotherapy on 01/18/2017.  He is scheduled for liver MRI on 01/19/2017.  Hypersensitivity reaction Patient presented to the [facility] today to receive his first cycle of carboplatin/Taxol chemotherapy regimen.  He was in the midst of the Taxol infusion; when he experienced a mild hypersensitivity reaction which consisted of visual flashing in feeling of chest tightness.  The infusion was held; and medications were given per hypersensitivity protocol.  All symptoms resolve; and patient was able to proceed with his chemotherapy  infusion as previously planned.  Long term current use of anticoagulant therapy Patient has responded diagnosed with pulmonary embolisms; and continues to take Lovenox injections on a daily basis as directed.   Patient stated understanding of all instructions; and was in agreement with this plan of care. The patient knows to call the clinic with any problems, questions or concerns.   Total time spent with patient was 15 minutes;  with greater than 75 percent of that time spent in face to face counseling regarding patient's symptoms,  and coordination of care and follow up.  Disclaimer:This dictation was prepared with Dragon/digital dictation along with [employer]. Any transcriptional errors that result from this process are unintentional.  Drue Second, NP 01/12/2017

## 2017-01-13 ENCOUNTER — Ambulatory Visit
Admission: RE | Admit: 2017-01-13 | Discharge: 2017-01-13 | Disposition: A | Discharge status: Home / Self Care | Payer: 59 | Source: Ambulatory Visit | Attending: Radiation Oncology | Admitting: Radiation Oncology

## 2017-01-13 ENCOUNTER — Ambulatory Visit: Payer: 59

## 2017-01-13 DIAGNOSIS — Z510 Encounter for antineoplastic radiation therapy: Secondary | ICD-10-CM | POA: Diagnosis not present

## 2017-01-13 DIAGNOSIS — M75112 Incomplete rotator cuff tear or rupture of left shoulder, not specified as traumatic: Secondary | ICD-10-CM | POA: Diagnosis not present

## 2017-01-13 DIAGNOSIS — Z4789 Encounter for other orthopedic aftercare: Secondary | ICD-10-CM | POA: Diagnosis not present

## 2017-01-13 DIAGNOSIS — C3432 Malignant neoplasm of lower lobe, left bronchus or lung: Secondary | ICD-10-CM | POA: Diagnosis not present

## 2017-01-13 DIAGNOSIS — C3492 Malignant neoplasm of unspecified part of left bronchus or lung: Secondary | ICD-10-CM

## 2017-01-13 DIAGNOSIS — M25512 Pain in left shoulder: Secondary | ICD-10-CM | POA: Diagnosis not present

## 2017-01-13 DIAGNOSIS — C7931 Secondary malignant neoplasm of brain: Secondary | ICD-10-CM | POA: Diagnosis not present

## 2017-01-13 NOTE — Progress Notes (Signed)
  Radiation Oncology         [phone]) [phone] ________________________________  Name: Calvin Wagner MRN: [id number]  Date: 01/13/2017  DOB: August 04, 1966  Simulation Verification Note    ICD-9-CM ICD-10-CM   1. Large cell carcinoma of left lung, stage 3 (HCC) 162.9 C34.92     Status: outpatient  NARRATIVE: The patient was brought to the treatment unit and placed in the planned treatment position. The clinical setup was verified. Then port films were obtained and uploaded to the radiation oncology medical record software.  The treatment beams were carefully compared against the planned radiation fields. The position location and shape of the radiation fields was reviewed. They targeted volume of tissue appears to be appropriately covered by the radiation beams. Organs at risk appear to be excluded as planned.  Based on my personal review, I approved the simulation verification. The patient's treatment will proceed as planned.  -----------------------------------  Blair Promise, PhD, MD

## 2017-01-13 NOTE — Telephone Encounter (Signed)
Received [employer] paper and given to [facility] for signature.

## 2017-01-14 ENCOUNTER — Ambulatory Visit
Admission: RE | Admit: 2017-01-14 | Discharge: 2017-01-14 | Disposition: A | Discharge status: Home / Self Care | Payer: 59 | Source: Ambulatory Visit | Attending: Radiation Oncology | Admitting: Radiation Oncology

## 2017-01-14 DIAGNOSIS — C3432 Malignant neoplasm of lower lobe, left bronchus or lung: Secondary | ICD-10-CM | POA: Diagnosis not present

## 2017-01-14 DIAGNOSIS — Z510 Encounter for antineoplastic radiation therapy: Secondary | ICD-10-CM | POA: Diagnosis not present

## 2017-01-14 DIAGNOSIS — C7931 Secondary malignant neoplasm of brain: Secondary | ICD-10-CM | POA: Diagnosis not present

## 2017-01-14 NOTE — Progress Notes (Signed)
Patient niot sent to nursing, will be seen tomorrow per [facility] RT therapist

## 2017-01-15 ENCOUNTER — Other Ambulatory Visit: Payer: 59

## 2017-01-15 ENCOUNTER — Telehealth: Payer: Self-pay

## 2017-01-15 ENCOUNTER — Inpatient Hospital Stay
Admission: RE | Admit: 2017-01-15 | Discharge: 2017-01-15 | Disposition: A | Discharge status: Home / Self Care | Payer: 59 | Source: Ambulatory Visit | Attending: Radiation Oncology | Admitting: Radiation Oncology

## 2017-01-15 ENCOUNTER — Ambulatory Visit
Admission: RE | Admit: 2017-01-15 | Discharge: 2017-01-15 | Disposition: A | Discharge status: Home / Self Care | Payer: 59 | Source: Ambulatory Visit | Attending: Radiation Oncology | Admitting: Radiation Oncology

## 2017-01-15 DIAGNOSIS — Z4789 Encounter for other orthopedic aftercare: Secondary | ICD-10-CM | POA: Diagnosis not present

## 2017-01-15 DIAGNOSIS — C3492 Malignant neoplasm of unspecified part of left bronchus or lung: Secondary | ICD-10-CM

## 2017-01-15 DIAGNOSIS — M75112 Incomplete rotator cuff tear or rupture of left shoulder, not specified as traumatic: Secondary | ICD-10-CM | POA: Diagnosis not present

## 2017-01-15 DIAGNOSIS — C7931 Secondary malignant neoplasm of brain: Secondary | ICD-10-CM | POA: Diagnosis not present

## 2017-01-15 DIAGNOSIS — C3432 Malignant neoplasm of lower lobe, left bronchus or lung: Secondary | ICD-10-CM | POA: Diagnosis not present

## 2017-01-15 DIAGNOSIS — Z510 Encounter for antineoplastic radiation therapy: Secondary | ICD-10-CM | POA: Diagnosis not present

## 2017-01-15 DIAGNOSIS — M25512 Pain in left shoulder: Secondary | ICD-10-CM | POA: Diagnosis not present

## 2017-01-15 MED ORDER — BIAFINE EX EMUL
CUTANEOUS | Status: DC | PRN
  Administered 2017-01-15: 08:00:00 via TOPICAL

## 2017-01-15 NOTE — Telephone Encounter (Signed)
nicole with [employer] called to confirm the treatment plan for the pt. This RN went over tx plan with nicole.

## 2017-01-16 ENCOUNTER — Ambulatory Visit (HOSPITAL_COMMUNITY): Payer: 59

## 2017-01-16 ENCOUNTER — Encounter (HOSPITAL_COMMUNITY)
Admission: RE | Admit: 2017-01-16 | Discharge: 2017-01-16 | Disposition: A | Discharge status: Home / Self Care | Payer: 59 | Source: Ambulatory Visit | Attending: Radiation Oncology | Admitting: Radiation Oncology

## 2017-01-16 ENCOUNTER — Encounter (HOSPITAL_COMMUNITY): Payer: Self-pay

## 2017-01-16 DIAGNOSIS — C7931 Secondary malignant neoplasm of brain: Secondary | ICD-10-CM | POA: Insufficient documentation

## 2017-01-16 DIAGNOSIS — C7949 Secondary malignant neoplasm of other parts of nervous system: Secondary | ICD-10-CM | POA: Diagnosis not present

## 2017-01-16 DIAGNOSIS — C3490 Malignant neoplasm of unspecified part of unspecified bronchus or lung: Secondary | ICD-10-CM | POA: Diagnosis not present

## 2017-01-16 MED ORDER — GADOBENATE DIMEGLUMINE 529 MG/ML IV SOLN
20.0000 mL | Freq: Once | INTRAVENOUS | Status: AC | PRN
  Administered 2017-01-16: 20 mL via INTRAVENOUS

## 2017-01-18 ENCOUNTER — Ambulatory Visit (HOSPITAL_BASED_OUTPATIENT_CLINIC_OR_DEPARTMENT_OTHER): Payer: 59 | Admitting: Nurse Practitioner

## 2017-01-18 ENCOUNTER — Ambulatory Visit (HOSPITAL_BASED_OUTPATIENT_CLINIC_OR_DEPARTMENT_OTHER): Payer: 59

## 2017-01-18 ENCOUNTER — Other Ambulatory Visit (HOSPITAL_BASED_OUTPATIENT_CLINIC_OR_DEPARTMENT_OTHER): Payer: 59

## 2017-01-18 ENCOUNTER — Ambulatory Visit
Admission: RE | Admit: 2017-01-18 | Discharge: 2017-01-18 | Disposition: A | Discharge status: Home / Self Care | Payer: 59 | Source: Ambulatory Visit | Attending: Radiation Oncology | Admitting: Radiation Oncology

## 2017-01-18 ENCOUNTER — Encounter: Payer: Self-pay | Admitting: Nurse Practitioner

## 2017-01-18 VITALS — BP 132/79 | HR 100 | Temp 98.9°F | Resp 18

## 2017-01-18 VITALS — BP 134/92 | HR 83 | Temp 98.6°F | Resp 20 | Ht 73.0 in | Wt 219.2 lb

## 2017-01-18 DIAGNOSIS — C3492 Malignant neoplasm of unspecified part of left bronchus or lung: Secondary | ICD-10-CM

## 2017-01-18 DIAGNOSIS — I2699 Other pulmonary embolism without acute cor pulmonale: Secondary | ICD-10-CM

## 2017-01-18 DIAGNOSIS — R634 Abnormal weight loss: Secondary | ICD-10-CM

## 2017-01-18 DIAGNOSIS — Z5111 Encounter for antineoplastic chemotherapy: Secondary | ICD-10-CM

## 2017-01-18 DIAGNOSIS — C7B8 Other secondary neuroendocrine tumors: Secondary | ICD-10-CM

## 2017-01-18 DIAGNOSIS — C7A1 Malignant poorly differentiated neuroendocrine tumors: Secondary | ICD-10-CM

## 2017-01-18 DIAGNOSIS — Z510 Encounter for antineoplastic radiation therapy: Secondary | ICD-10-CM | POA: Diagnosis not present

## 2017-01-18 DIAGNOSIS — C7931 Secondary malignant neoplasm of brain: Secondary | ICD-10-CM | POA: Diagnosis not present

## 2017-01-18 DIAGNOSIS — C3432 Malignant neoplasm of lower lobe, left bronchus or lung: Secondary | ICD-10-CM | POA: Diagnosis not present

## 2017-01-18 LAB — CBC WITH DIFFERENTIAL/PLATELET
BASO%: 0.4 % (ref 0.0–2.0)
Basophils Absolute: 0 10*3/uL (ref 0.0–0.1)
EOS%: 2.3 % (ref 0.0–7.0)
Eosinophils Absolute: 0.1 10*3/uL (ref 0.0–0.5)
HCT: 44.4 % (ref 38.4–49.9)
HGB: 15.4 g/dL (ref 13.0–17.1)
LYMPH%: 15.3 % (ref 14.0–49.0)
MCH: 31.4 pg (ref 27.2–33.4)
MCHC: 34.7 g/dL (ref 32.0–36.0)
MCV: 90.6 fL (ref 79.3–98.0)
MONO#: 0.3 10*3/uL (ref 0.1–0.9)
MONO%: 6.3 % (ref 0.0–14.0)
NEUT#: 3.8 10*3/uL (ref 1.5–6.5)
NEUT%: 75.7 % — ABNORMAL HIGH (ref 39.0–75.0)
Platelets: 305 10*3/uL (ref 140–400)
RBC: 4.9 10*6/uL (ref 4.20–5.82)
RDW: 12.8 % (ref 11.0–14.6)
WBC: 5.1 10*3/uL (ref 4.0–10.3)
lymph#: 0.8 10*3/uL — ABNORMAL LOW (ref 0.9–3.3)

## 2017-01-18 LAB — COMPREHENSIVE METABOLIC PANEL
ALT: 19 U/L (ref 0–55)
AST: 10 U/L (ref 5–34)
Albumin: 4.1 g/dL (ref 3.5–5.0)
Alkaline Phosphatase: 80 U/L (ref 40–150)
Anion Gap: 13 mEq/L — ABNORMAL HIGH (ref 3–11)
BUN: 14 mg/dL (ref 7.0–26.0)
CO2: 24 mEq/L (ref 22–29)
Calcium: 10.1 mg/dL (ref 8.4–10.4)
Chloride: 104 mEq/L (ref 98–109)
Creatinine: 1 mg/dL (ref 0.7–1.3)
EGFR: 88 mL/min/{1.73_m2} — ABNORMAL LOW (ref >90)
Glucose: 134 mg/dl (ref 70–140)
Potassium: 4.2 mEq/L (ref 3.5–5.1)
Sodium: 140 mEq/L (ref 136–145)
Total Bilirubin: 0.44 mg/dL (ref 0.20–1.20)
Total Protein: 7.3 g/dL (ref 6.4–8.3)

## 2017-01-18 MED ORDER — DIPHENHYDRAMINE HCL 50 MG/ML IJ SOLN
50.0000 mg | Freq: Once | INTRAMUSCULAR | Status: AC
  Administered 2017-01-18: 50 mg via INTRAVENOUS

## 2017-01-18 MED ORDER — PALONOSETRON HCL INJECTION 0.25 MG/5ML
INTRAVENOUS | Status: AC
  Filled 2017-01-18: qty 5

## 2017-01-18 MED ORDER — PACLITAXEL CHEMO INJECTION 300 MG/50ML
45.0000 mg/m2 | Freq: Once | INTRAVENOUS | Status: AC
  Administered 2017-01-18: 102 mg via INTRAVENOUS
  Filled 2017-01-18: qty 17

## 2017-01-18 MED ORDER — SODIUM CHLORIDE 0.9 % IV SOLN
20.0000 mg | Freq: Once | INTRAVENOUS | Status: AC
  Administered 2017-01-18: 20 mg via INTRAVENOUS
  Filled 2017-01-18: qty 2

## 2017-01-18 MED ORDER — SODIUM CHLORIDE 0.9 % IV SOLN
Freq: Once | INTRAVENOUS | Status: AC
  Administered 2017-01-18: 13:00:00 via INTRAVENOUS

## 2017-01-18 MED ORDER — FAMOTIDINE IN NACL 20-0.9 MG/50ML-% IV SOLN
20.0000 mg | Freq: Once | INTRAVENOUS | Status: AC
  Administered 2017-01-18: 20 mg via INTRAVENOUS

## 2017-01-18 MED ORDER — PALONOSETRON HCL INJECTION 0.25 MG/5ML
0.2500 mg | Freq: Once | INTRAVENOUS | Status: AC
  Administered 2017-01-18: 0.25 mg via INTRAVENOUS

## 2017-01-18 MED ORDER — SODIUM CHLORIDE 0.9 % IV SOLN
281.6000 mg | Freq: Once | INTRAVENOUS | Status: AC
  Administered 2017-01-18: 280 mg via INTRAVENOUS
  Filled 2017-01-18: qty 28

## 2017-01-18 MED ORDER — TRAMADOL HCL 50 MG PO TABS: 50.0000 mg | ORAL_TABLET | Freq: Two times a day (BID) | ORAL | 0 refills | Status: DC | PRN

## 2017-01-18 MED ORDER — FAMOTIDINE IN NACL 20-0.9 MG/50ML-% IV SOLN
INTRAVENOUS | Status: AC
  Filled 2017-01-18: qty 50

## 2017-01-18 MED ORDER — DIPHENHYDRAMINE HCL 50 MG/ML IJ SOLN
INTRAMUSCULAR | Status: AC
  Filled 2017-01-18: qty 1

## 2017-01-18 NOTE — Progress Notes (Signed)
[facility] OFFICE PROGRESS NOTE   Diagnosis:  Non-small cell lung cancer  INTERVAL HISTORY:   Calvin Wagner returns as scheduled. He completed cycle 1 weekly carboplatin/Taxol 01/11/2017. He began radiation on 01/13/2017. He reports becoming "flushed and hot" as well as developing chest tightness during the Taxol infusion. The infusion was placed on hold and he received medication per the hypersensitivity protocol. All symptoms resolved and he was able to complete the infusion. That night his wife noticed that he appeared yellow and pale. They think that this may have occurred after he went to the bathroom. The next day he appeared to "normal". He had mild nausea. No vomiting. No mouth sores. No diarrhea or constipation. He thinks his breathing overall is better. He continues to have a cough. He continues Lovenox. He denies any bleeding.  Objective:  Vital signs in last 24 hours:  Blood pressure (!) 134/92, pulse 83, temperature 98.6 F (37 C), temperature source Oral, resp. rate 20, height 6' 1"  (1.854 m), weight 219 lb 3.2 oz (99.4 kg), SpO2 98 %.    HEENT: No thrush or ulcers. Lymphatics: No palpable cervical or supra-clavicular lymph nodes. Resp: Lungs clear bilaterally. Cardio: Regular rate and rhythm. GI: Abdomen soft and nontender. No hepatomegaly. Vascular: No leg edema.   Lab Results:  Lab Results  Component Value Date   WBC 5.1 01/18/2017   HGB 15.4 01/18/2017   HCT 44.4 01/18/2017   MCV 90.6 01/18/2017   PLT 305 01/18/2017   NEUTROABS 3.8 01/18/2017    Imaging:  Mr Calvin Wagner Contrast  Result Date: 01/16/2017 CLINICAL DATA:  Follow-up metastatic lung cancer. EXAM: MRI HEAD WITHOUT AND WITH CONTRAST TECHNIQUE: Multiplanar, multiecho pulse sequences of the brain and surrounding structures were obtained without and with intravenous contrast. CONTRAST:  77m MULTIHANCE GADOBENATE DIMEGLUMINE 529 MG/ML IV SOLN COMPARISON:  MRI of the head January 02, 2017  FINDINGS: INTRACRANIAL CONTENTS: No reduced diffusion to suggest acute ischemia or hypercellular tumor. No susceptibility artifact to suggest hemorrhage. Stable appearance of 9 x 12 mm LEFT inferior frontal lobe rim enhancing mass a gray-white matter junction without marginal vasogenic edema. A few additional subcentimeter supratentorial white matter FLAIR T2 hyperintensities compatible with mild chronic small vessel ischemic disease, stable. No midline shift or mass effect. No abnormal extra-axial fluid collections or abnormal extra-axial enhancement VASCULAR: Normal major intracranial vascular flow voids present at skull base. SKULL AND UPPER CERVICAL SPINE: No abnormal sellar expansion. No suspicious calvarial bone marrow signal. Craniocervical junction maintained. SINUSES/ORBITS: The mastoid air-cells and included paranasal sinuses are well-aerated.The included ocular globes and orbital contents are non-suspicious. OTHER: None. IMPRESSION: Stable appearance of solitary 9 x 12 mm LEFT inferior frontal lobe cystic mass most compatible with metastatic disease given patient's history of lung cancer. Electronically Signed   By: CElon AlasM.D.   On: 01/16/2017 20:43    Medications: I have reviewed the patient's current medications.  Assessment/Plan: 1. 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 Initiation of radiation 01/13/2017; initiation of weekly Taxol/carboplatin 01/11/2017. 2. Bilateral acute pulmonary emboli on chest CT 11/25/2016. Currently maintained on Lovenox. 3. Weight loss. Referral to [facility] 01/18/2017.   Disposition: Mr. GMundorf appears stable. He continues radiation. Plan to  proceed with week 2 Taxol/carboplatin today as scheduled.  Dr. Julien Wagner reviewed the result of the brain MRI from 01/16/2017 with [name] and his wife. SRS is planned.  He is currently maintained on Lovenox for bilateral acute pulmonary emboli December 2018. At today's visit he and his wife inquired about switching to a different anticoagulant. Dr. Julien Wagner recommends continuation of daily Lovenox for now. They are in agreement.  He has had recent weight loss. We will make a referral to the [place] dietitian. His wife inquired about vitamin supplementation. Dr. Julien Wagner recommended an over-the-counter daily multivitamin.  He will return for week 3 Taxol/carboplatin in one week. He will be seen in follow-up in 2 weeks.  Patient seen with Dr. Julien Wagner.    Calvin Wagner ANP/GNP-BC   01/18/2017  1:10 PM  ADDENDUM: Hematology/Oncology Attending: I had a face to face encounter with the patient today. I recommended his care plan. 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 Molecular studies by [facility] showed MET alteration at H633Y. His repeat MRI of the brain showed no other metastatic disease except for the solitary brain metastasis and the patient is scheduled for stereotactic radiotherapy to this lesion soon. He is currently undergoing a course of concurrent chemoradiation with weekly carboplatin and paclitaxel status post 1 cycle. He tolerated the first week of his treatment well except for mild fatigue.  I recommended for the patient to proceed with cycle #2 today as a scheduled. For the bilateral acute pulmonary embolism, the patient will continue his current treatment with subcutaneous Lovenox for now. I will see him back for follow-up visit in 2 weeks for evaluation and  management of any adverse effect of his treatment before starting cycle #4. He was advised to call immediately if he has any concerning symptoms in the interval.  Disclaimer: This note was dictated with voice recognition software. Similar sounding words can inadvertently be transcribed and may be missed upon review. Eilleen Kempf., MD 01/18/17

## 2017-01-18 NOTE — Patient Instructions (Signed)
[facility] Discharge Instructions for Patients Receiving Chemotherapy  Today you received the following chemotherapy agents: Taxol and carboplatin   To help prevent nausea and vomiting after your treatment, we encourage you to take your nausea medication as directed.    If you develop nausea and vomiting that is not controlled by your nausea medication, call the clinic.   BELOW ARE SYMPTOMS THAT SHOULD BE REPORTED IMMEDIATELY:  *FEVER GREATER THAN 100.5 F  *CHILLS WITH OR WITHOUT FEVER  NAUSEA AND VOMITING THAT IS NOT CONTROLLED WITH YOUR NAUSEA MEDICATION  *UNUSUAL SHORTNESS OF BREATH  *UNUSUAL BRUISING OR BLEEDING  TENDERNESS IN MOUTH AND THROAT WITH OR WITHOUT PRESENCE OF ULCERS  *URINARY PROBLEMS  *BOWEL PROBLEMS  UNUSUAL RASH Items with * indicate a potential emergency and should be followed up as soon as possible.  Feel free to call the clinic you have any questions or concerns. The clinic phone number is [phone].  Please show the CHEMO ALERT CARD at check-in to the Emergency Department and triage nurse.   

## 2017-01-19 ENCOUNTER — Ambulatory Visit
Admission: RE | Admit: 2017-01-19 | Discharge: 2017-01-19 | Disposition: A | Discharge status: Home / Self Care | Payer: 59 | Source: Ambulatory Visit | Attending: Radiation Oncology | Admitting: Radiation Oncology

## 2017-01-19 ENCOUNTER — Ambulatory Visit (HOSPITAL_COMMUNITY): Payer: 59

## 2017-01-19 ENCOUNTER — Ambulatory Visit (HOSPITAL_COMMUNITY)
Admission: RE | Admit: 2017-01-19 | Discharge: 2017-01-19 | Disposition: A | Discharge status: Home / Self Care | Payer: 59 | Source: Ambulatory Visit | Attending: Internal Medicine | Admitting: Internal Medicine

## 2017-01-19 DIAGNOSIS — C3432 Malignant neoplasm of lower lobe, left bronchus or lung: Secondary | ICD-10-CM | POA: Diagnosis not present

## 2017-01-19 DIAGNOSIS — M75112 Incomplete rotator cuff tear or rupture of left shoulder, not specified as traumatic: Secondary | ICD-10-CM | POA: Diagnosis not present

## 2017-01-19 DIAGNOSIS — Z510 Encounter for antineoplastic radiation therapy: Secondary | ICD-10-CM | POA: Diagnosis not present

## 2017-01-19 DIAGNOSIS — Z4789 Encounter for other orthopedic aftercare: Secondary | ICD-10-CM | POA: Diagnosis not present

## 2017-01-19 DIAGNOSIS — K769 Liver disease, unspecified: Secondary | ICD-10-CM | POA: Insufficient documentation

## 2017-01-19 DIAGNOSIS — C7931 Secondary malignant neoplasm of brain: Secondary | ICD-10-CM | POA: Diagnosis not present

## 2017-01-19 DIAGNOSIS — R911 Solitary pulmonary nodule: Secondary | ICD-10-CM | POA: Diagnosis not present

## 2017-01-19 DIAGNOSIS — C3492 Malignant neoplasm of unspecified part of left bronchus or lung: Secondary | ICD-10-CM | POA: Insufficient documentation

## 2017-01-19 DIAGNOSIS — C801 Malignant (primary) neoplasm, unspecified: Secondary | ICD-10-CM

## 2017-01-19 DIAGNOSIS — M25512 Pain in left shoulder: Secondary | ICD-10-CM | POA: Diagnosis not present

## 2017-01-19 DIAGNOSIS — M899 Disorder of bone, unspecified: Secondary | ICD-10-CM | POA: Diagnosis not present

## 2017-01-19 MED ORDER — BIAFINE EX EMUL
Freq: Every day | CUTANEOUS | Status: DC
  Administered 2017-01-19: 12:00:00 via TOPICAL

## 2017-01-19 MED ORDER — GADOBENATE DIMEGLUMINE 529 MG/ML IV SOLN
20.0000 mL | Freq: Once | INTRAVENOUS | Status: AC
  Administered 2017-01-19: 20 mL via INTRAVENOUS

## 2017-01-20 ENCOUNTER — Ambulatory Visit
Admission: RE | Admit: 2017-01-20 | Discharge: 2017-01-20 | Disposition: A | Discharge status: Home / Self Care | Payer: 59 | Source: Ambulatory Visit | Attending: Radiation Oncology | Admitting: Radiation Oncology

## 2017-01-20 ENCOUNTER — Telehealth: Payer: Self-pay | Admitting: Medical Oncology

## 2017-01-20 VITALS — BP 132/92 | HR 102 | Resp 20

## 2017-01-20 DIAGNOSIS — M75112 Incomplete rotator cuff tear or rupture of left shoulder, not specified as traumatic: Secondary | ICD-10-CM | POA: Diagnosis not present

## 2017-01-20 DIAGNOSIS — C3492 Malignant neoplasm of unspecified part of left bronchus or lung: Secondary | ICD-10-CM

## 2017-01-20 DIAGNOSIS — C3432 Malignant neoplasm of lower lobe, left bronchus or lung: Secondary | ICD-10-CM | POA: Diagnosis not present

## 2017-01-20 DIAGNOSIS — M25512 Pain in left shoulder: Secondary | ICD-10-CM | POA: Diagnosis not present

## 2017-01-20 DIAGNOSIS — I10 Essential (primary) hypertension: Secondary | ICD-10-CM | POA: Diagnosis not present

## 2017-01-20 DIAGNOSIS — Z4789 Encounter for other orthopedic aftercare: Secondary | ICD-10-CM | POA: Diagnosis not present

## 2017-01-20 DIAGNOSIS — C7931 Secondary malignant neoplasm of brain: Secondary | ICD-10-CM

## 2017-01-20 DIAGNOSIS — Z510 Encounter for antineoplastic radiation therapy: Secondary | ICD-10-CM | POA: Diagnosis not present

## 2017-01-20 DIAGNOSIS — C7B8 Other secondary neuroendocrine tumors: Secondary | ICD-10-CM | POA: Diagnosis not present

## 2017-01-20 DIAGNOSIS — C7A1 Malignant poorly differentiated neuroendocrine tumors: Secondary | ICD-10-CM | POA: Diagnosis not present

## 2017-01-20 DIAGNOSIS — Z5111 Encounter for antineoplastic chemotherapy: Secondary | ICD-10-CM | POA: Diagnosis not present

## 2017-01-20 DIAGNOSIS — C3490 Malignant neoplasm of unspecified part of unspecified bronchus or lung: Secondary | ICD-10-CM | POA: Diagnosis not present

## 2017-01-20 MED ORDER — LORAZEPAM 0.5 MG PO TABS
0.5000 mg | ORAL_TABLET | Freq: Once | ORAL | Status: AC
  Administered 2017-01-20: 0.5 mg via ORAL
  Filled 2017-01-20: qty 1

## 2017-01-20 MED ORDER — SODIUM CHLORIDE 0.9% FLUSH
10.0000 mL | Freq: Once | INTRAVENOUS | Status: AC
  Administered 2017-01-20: 10 mL via INTRAVENOUS

## 2017-01-20 NOTE — Telephone Encounter (Signed)
Will be happy to discuss it further during next visit. She was told the prognosis last visit.

## 2017-01-20 NOTE — Telephone Encounter (Signed)
"  She wants a scale on his prognosis-I need someone to be honest with me, this is just between me , the doctors and the nurses ".  He feels like they have received  a lot of bad news and is wanting some statistics on prognosis and survival" for her piece of mind and future thoughts and protect Orhan's sanity"

## 2017-01-20 NOTE — Progress Notes (Signed)
Has armband been applied?    Does patient have an allergy to IV contrast dye?: NO   Has patient ever received premedication for IV contrast dye?: NO  Does patient take metformin?: NO  If patient does take metformin when was the last dose: N/A  Date of lab work:01/18/2017 BUN: 14 CR: 1.0  IV site:   Has IV site been added to flowsheet?    There were no vitals taken for this visit.

## 2017-01-20 NOTE — Progress Notes (Addendum)
Has armband been applied?  Yes  Does patient have an allergy to IV contrast dye?: NO   Has patient ever received premedication for IV contrast dye?: NO  Does patient take metformin?: NO  If patient does take metformin when was the last dose: N/A  Date of lab work:01/18/2017 BUN: 14 CR: 1.0  IV site: RAC x 1 attempt, 22g  Catheter, excellent blood return, op site placed over skin ,, flushed with 10 ml saline, pt tolerated well no c/o pain  Has IV site been added to flowsheet?  Yes Removed IV catheter, tip intact, placed 2  2x2 gause over RAC, taped and secured, held pressure 2 minutes , patient shaky and offered coke and graham crackers with p nut butter,  No pain stated, but anxiety still, patient was given ativan 0.'5mg'$  sl by Guadelupe Sabin RN earlier,, gave printed MRI Liver 01/19/17  Given to Dr. Sondra Come per wife Baker Janus request that he read results to her and spouse, she didn't want patient to be by himself when results were givrn, MD in the room with both 3:24 PM

## 2017-01-21 ENCOUNTER — Ambulatory Visit
Admission: RE | Admit: 2017-01-21 | Discharge: 2017-01-21 | Disposition: A | Discharge status: Home / Self Care | Payer: 59 | Source: Ambulatory Visit | Attending: Radiation Oncology | Admitting: Radiation Oncology

## 2017-01-21 DIAGNOSIS — Z4789 Encounter for other orthopedic aftercare: Secondary | ICD-10-CM | POA: Diagnosis not present

## 2017-01-21 DIAGNOSIS — M75112 Incomplete rotator cuff tear or rupture of left shoulder, not specified as traumatic: Secondary | ICD-10-CM | POA: Diagnosis not present

## 2017-01-21 DIAGNOSIS — M25512 Pain in left shoulder: Secondary | ICD-10-CM | POA: Diagnosis not present

## 2017-01-21 DIAGNOSIS — I10 Essential (primary) hypertension: Secondary | ICD-10-CM | POA: Diagnosis not present

## 2017-01-21 DIAGNOSIS — C3432 Malignant neoplasm of lower lobe, left bronchus or lung: Secondary | ICD-10-CM | POA: Diagnosis not present

## 2017-01-21 DIAGNOSIS — Z510 Encounter for antineoplastic radiation therapy: Secondary | ICD-10-CM | POA: Diagnosis not present

## 2017-01-21 DIAGNOSIS — C7931 Secondary malignant neoplasm of brain: Secondary | ICD-10-CM | POA: Diagnosis not present

## 2017-01-22 ENCOUNTER — Ambulatory Visit
Admission: RE | Admit: 2017-01-22 | Discharge: 2017-01-22 | Disposition: A | Discharge status: Home / Self Care | Payer: 59 | Source: Ambulatory Visit | Attending: Radiation Oncology | Admitting: Radiation Oncology

## 2017-01-22 DIAGNOSIS — C7931 Secondary malignant neoplasm of brain: Secondary | ICD-10-CM | POA: Diagnosis not present

## 2017-01-22 DIAGNOSIS — Z510 Encounter for antineoplastic radiation therapy: Secondary | ICD-10-CM | POA: Diagnosis not present

## 2017-01-22 DIAGNOSIS — C3432 Malignant neoplasm of lower lobe, left bronchus or lung: Secondary | ICD-10-CM | POA: Diagnosis not present

## 2017-01-25 ENCOUNTER — Encounter: Payer: Self-pay | Admitting: Radiation Oncology

## 2017-01-25 ENCOUNTER — Telehealth: Payer: Self-pay | Admitting: Oncology

## 2017-01-25 ENCOUNTER — Other Ambulatory Visit (HOSPITAL_BASED_OUTPATIENT_CLINIC_OR_DEPARTMENT_OTHER): Payer: 59

## 2017-01-25 ENCOUNTER — Ambulatory Visit
Admission: RE | Admit: 2017-01-25 | Discharge: 2017-01-25 | Disposition: A | Discharge status: Home / Self Care | Payer: 59 | Source: Ambulatory Visit | Attending: Radiation Oncology | Admitting: Radiation Oncology

## 2017-01-25 ENCOUNTER — Ambulatory Visit (HOSPITAL_BASED_OUTPATIENT_CLINIC_OR_DEPARTMENT_OTHER): Payer: 59

## 2017-01-25 ENCOUNTER — Other Ambulatory Visit: Payer: Self-pay | Admitting: Radiation Oncology

## 2017-01-25 VITALS — BP 142/91 | HR 115 | Temp 99.2°F | Resp 18

## 2017-01-25 VITALS — BP 143/103 | HR 109 | Temp 98.4°F | Resp 18

## 2017-01-25 DIAGNOSIS — Z5111 Encounter for antineoplastic chemotherapy: Secondary | ICD-10-CM | POA: Diagnosis not present

## 2017-01-25 DIAGNOSIS — C7B8 Other secondary neuroendocrine tumors: Secondary | ICD-10-CM

## 2017-01-25 DIAGNOSIS — C7A1 Malignant poorly differentiated neuroendocrine tumors: Secondary | ICD-10-CM

## 2017-01-25 DIAGNOSIS — C7931 Secondary malignant neoplasm of brain: Secondary | ICD-10-CM

## 2017-01-25 DIAGNOSIS — C3492 Malignant neoplasm of unspecified part of left bronchus or lung: Secondary | ICD-10-CM

## 2017-01-25 DIAGNOSIS — C3490 Malignant neoplasm of unspecified part of unspecified bronchus or lung: Secondary | ICD-10-CM | POA: Diagnosis not present

## 2017-01-25 DIAGNOSIS — Z510 Encounter for antineoplastic radiation therapy: Secondary | ICD-10-CM | POA: Diagnosis not present

## 2017-01-25 LAB — COMPREHENSIVE METABOLIC PANEL
ALT: 25 U/L (ref 0–55)
AST: 11 U/L (ref 5–34)
Albumin: 4.1 g/dL (ref 3.5–5.0)
Alkaline Phosphatase: 73 U/L (ref 40–150)
Anion Gap: 12 mEq/L — ABNORMAL HIGH (ref 3–11)
BUN: 9.8 mg/dL (ref 7.0–26.0)
CO2: 24 mEq/L (ref 22–29)
Calcium: 9.9 mg/dL (ref 8.4–10.4)
Chloride: 105 mEq/L (ref 98–109)
Creatinine: 1 mg/dL (ref 0.7–1.3)
EGFR: 88 mL/min/{1.73_m2} — ABNORMAL LOW (ref >90)
Glucose: 131 mg/dl (ref 70–140)
Potassium: 3.7 mEq/L (ref 3.5–5.1)
Sodium: 141 mEq/L (ref 136–145)
Total Bilirubin: 0.31 mg/dL (ref 0.20–1.20)
Total Protein: 7.3 g/dL (ref 6.4–8.3)

## 2017-01-25 LAB — CBC WITH DIFFERENTIAL/PLATELET
BASO%: 0.4 % (ref 0.0–2.0)
Basophils Absolute: 0 10*3/uL (ref 0.0–0.1)
EOS%: 2.4 % (ref 0.0–7.0)
Eosinophils Absolute: 0.1 10*3/uL (ref 0.0–0.5)
HCT: 44.2 % (ref 38.4–49.9)
HGB: 15 g/dL (ref 13.0–17.1)
LYMPH%: 11.8 % — ABNORMAL LOW (ref 14.0–49.0)
MCH: 31.2 pg (ref 27.2–33.4)
MCHC: 33.9 g/dL (ref 32.0–36.0)
MCV: 91.9 fL (ref 79.3–98.0)
MONO#: 0.3 10*3/uL (ref 0.1–0.9)
MONO%: 7.9 % (ref 0.0–14.0)
NEUT#: 3.1 10*3/uL (ref 1.5–6.5)
NEUT%: 77.5 % — ABNORMAL HIGH (ref 39.0–75.0)
Platelets: 295 10*3/uL (ref 140–400)
RBC: 4.81 10*6/uL (ref 4.20–5.82)
RDW: 13.5 % (ref 11.0–14.6)
WBC: 4 10*3/uL (ref 4.0–10.3)
lymph#: 0.5 10*3/uL — ABNORMAL LOW (ref 0.9–3.3)

## 2017-01-25 MED ORDER — SODIUM CHLORIDE 0.9 % IV SOLN
281.6000 mg | Freq: Once | INTRAVENOUS | Status: AC
  Administered 2017-01-25: 280 mg via INTRAVENOUS
  Filled 2017-01-25: qty 28

## 2017-01-25 MED ORDER — PALONOSETRON HCL INJECTION 0.25 MG/5ML
0.2500 mg | Freq: Once | INTRAVENOUS | Status: AC
  Administered 2017-01-25: 0.25 mg via INTRAVENOUS

## 2017-01-25 MED ORDER — PALONOSETRON HCL INJECTION 0.25 MG/5ML
INTRAVENOUS | Status: AC
  Filled 2017-01-25: qty 5

## 2017-01-25 MED ORDER — SODIUM CHLORIDE 0.9 % IV SOLN
20.0000 mg | Freq: Once | INTRAVENOUS | Status: AC
  Administered 2017-01-25: 20 mg via INTRAVENOUS
  Filled 2017-01-25: qty 2

## 2017-01-25 MED ORDER — FAMOTIDINE IN NACL 20-0.9 MG/50ML-% IV SOLN
INTRAVENOUS | Status: AC
  Filled 2017-01-25: qty 50

## 2017-01-25 MED ORDER — DIPHENHYDRAMINE HCL 50 MG/ML IJ SOLN
50.0000 mg | Freq: Once | INTRAMUSCULAR | Status: AC
  Administered 2017-01-25: 50 mg via INTRAVENOUS

## 2017-01-25 MED ORDER — FAMOTIDINE IN NACL 20-0.9 MG/50ML-% IV SOLN
20.0000 mg | Freq: Once | INTRAVENOUS | Status: AC
  Administered 2017-01-25: 20 mg via INTRAVENOUS

## 2017-01-25 MED ORDER — DIPHENHYDRAMINE HCL 50 MG/ML IJ SOLN
INTRAMUSCULAR | Status: AC
  Filled 2017-01-25: qty 1

## 2017-01-25 MED ORDER — PACLITAXEL CHEMO INJECTION 300 MG/50ML
45.0000 mg/m2 | Freq: Once | INTRAVENOUS | Status: AC
  Administered 2017-01-25: 102 mg via INTRAVENOUS
  Filled 2017-01-25: qty 17

## 2017-01-25 MED ORDER — HYDROCOD POLST-CPM POLST ER 10-8 MG/5ML PO SUER: 5.0000 mL | Freq: Two times a day (BID) | ORAL | 0 refills | Status: DC | PRN

## 2017-01-25 MED ORDER — SODIUM CHLORIDE 0.9 % IV SOLN
Freq: Once | INTRAVENOUS | Status: AC
Start: 2017-01-25 — End: 2017-01-25
  Administered 2017-01-25: 11:00:00 via INTRAVENOUS

## 2017-01-25 NOTE — Progress Notes (Addendum)
[place]  Brain completed x 1, patient sated no pain no headache, but just now took his b/p medication,  No nausea or blurry vision, no light headiness, did take his ativan prior to [facility] tx, "stated that was quick", now waiting for his rad txc to chest,wife at side 8:38 AM BP (!) 149/101 (BP Location: Left Arm, Patient Position: Sitting, Cuff Size: Normal)   Pulse (!) 112   Temp 98.4 F (36.9 C) (Oral)   BP (!) 143/103 (BP Location: Right Arm, Patient Position: Sitting, Cuff Size: Normal)   Pulse (!) 109   Temp 98.4 F (36.9 C) (Oral)   Resp 18

## 2017-01-25 NOTE — Patient Instructions (Signed)
[facility] Discharge Instructions for Patients Receiving Chemotherapy  Today you received the following chemotherapy agents Taxol and Carboplatin. To help prevent nausea and vomiting after your treatment, we encourage you to take your nausea medication as directed.  If you develop nausea and vomiting that is not controlled by your nausea medication, call the clinic.   BELOW ARE SYMPTOMS THAT SHOULD BE REPORTED IMMEDIATELY:  *FEVER GREATER THAN 100.5 F  *CHILLS WITH OR WITHOUT FEVER  NAUSEA AND VOMITING THAT IS NOT CONTROLLED WITH YOUR NAUSEA MEDICATION  *UNUSUAL SHORTNESS OF BREATH  *UNUSUAL BRUISING OR BLEEDING  TENDERNESS IN MOUTH AND THROAT WITH OR WITHOUT PRESENCE OF ULCERS  *URINARY PROBLEMS  *BOWEL PROBLEMS  UNUSUAL RASH Items with * indicate a potential emergency and should be followed up as soon as possible.  Feel free to call the clinic you have any questions or concerns. The clinic phone number is [phone].  Please show the CHEMO ALERT CARD at check-in to the Emergency Department and triage nurse.    

## 2017-01-25 NOTE — Telephone Encounter (Signed)
Left message that patient's refill for Tussionex is ready for pick up in the Radiation Oncology nursing area.

## 2017-01-26 ENCOUNTER — Other Ambulatory Visit: Payer: Self-pay | Admitting: Radiation Oncology

## 2017-01-26 ENCOUNTER — Telehealth: Payer: Self-pay | Admitting: Oncology

## 2017-01-26 ENCOUNTER — Ambulatory Visit
Admission: RE | Admit: 2017-01-26 | Discharge: 2017-01-26 | Disposition: A | Discharge status: Home / Self Care | Payer: 59 | Source: Ambulatory Visit | Attending: Radiation Oncology | Admitting: Radiation Oncology

## 2017-01-26 DIAGNOSIS — C7931 Secondary malignant neoplasm of brain: Secondary | ICD-10-CM | POA: Diagnosis not present

## 2017-01-26 DIAGNOSIS — M75112 Incomplete rotator cuff tear or rupture of left shoulder, not specified as traumatic: Secondary | ICD-10-CM | POA: Diagnosis not present

## 2017-01-26 DIAGNOSIS — C3432 Malignant neoplasm of lower lobe, left bronchus or lung: Secondary | ICD-10-CM | POA: Diagnosis not present

## 2017-01-26 DIAGNOSIS — Z510 Encounter for antineoplastic radiation therapy: Secondary | ICD-10-CM | POA: Diagnosis not present

## 2017-01-26 DIAGNOSIS — Z4789 Encounter for other orthopedic aftercare: Secondary | ICD-10-CM | POA: Diagnosis not present

## 2017-01-26 DIAGNOSIS — C3492 Malignant neoplasm of unspecified part of left bronchus or lung: Secondary | ICD-10-CM

## 2017-01-26 DIAGNOSIS — I10 Essential (primary) hypertension: Secondary | ICD-10-CM | POA: Diagnosis not present

## 2017-01-26 DIAGNOSIS — M25512 Pain in left shoulder: Secondary | ICD-10-CM | POA: Diagnosis not present

## 2017-01-26 DIAGNOSIS — R000 Tachycardia, unspecified: Secondary | ICD-10-CM | POA: Diagnosis not present

## 2017-01-26 LAB — GUARDANT 360

## 2017-01-26 MED ORDER — SUCRALFATE 1 GM/10ML PO SUSP: 1.0000 g | Freq: Three times a day (TID) | ORAL | 0 refills | Status: DC

## 2017-01-26 NOTE — Telephone Encounter (Signed)
Called patient's wife and let her know that a prior authorization has been started and will take about 48 hours to go through.

## 2017-01-26 NOTE — Telephone Encounter (Addendum)
Called [facility] regarding Tussionex prescription.  They said it has a hold from patient's insurance saying that he can't refill it more than 2 times in 54 days.  Called [facility] and initiated a prior authorization.

## 2017-01-27 ENCOUNTER — Ambulatory Visit
Admission: RE | Admit: 2017-01-27 | Discharge: 2017-01-27 | Disposition: A | Discharge status: Home / Self Care | Payer: 59 | Source: Ambulatory Visit | Attending: Radiation Oncology | Admitting: Radiation Oncology

## 2017-01-27 DIAGNOSIS — C3432 Malignant neoplasm of lower lobe, left bronchus or lung: Secondary | ICD-10-CM | POA: Diagnosis not present

## 2017-01-27 DIAGNOSIS — Z4789 Encounter for other orthopedic aftercare: Secondary | ICD-10-CM | POA: Diagnosis not present

## 2017-01-27 DIAGNOSIS — C7931 Secondary malignant neoplasm of brain: Secondary | ICD-10-CM | POA: Diagnosis not present

## 2017-01-27 DIAGNOSIS — M75112 Incomplete rotator cuff tear or rupture of left shoulder, not specified as traumatic: Secondary | ICD-10-CM | POA: Diagnosis not present

## 2017-01-27 DIAGNOSIS — Z510 Encounter for antineoplastic radiation therapy: Secondary | ICD-10-CM | POA: Diagnosis not present

## 2017-01-27 DIAGNOSIS — M25512 Pain in left shoulder: Secondary | ICD-10-CM | POA: Diagnosis not present

## 2017-01-27 NOTE — Telephone Encounter (Signed)
Calvin Wagner and notified her that the prior authorization for Tussionex has been approved and she should be able to pick up the prescription from [facility].

## 2017-01-28 ENCOUNTER — Ambulatory Visit
Admission: RE | Admit: 2017-01-28 | Discharge: 2017-01-28 | Disposition: A | Discharge status: Home / Self Care | Payer: 59 | Source: Ambulatory Visit | Attending: Radiation Oncology | Admitting: Radiation Oncology

## 2017-01-28 DIAGNOSIS — Z510 Encounter for antineoplastic radiation therapy: Secondary | ICD-10-CM | POA: Diagnosis not present

## 2017-01-28 DIAGNOSIS — M75112 Incomplete rotator cuff tear or rupture of left shoulder, not specified as traumatic: Secondary | ICD-10-CM | POA: Diagnosis not present

## 2017-01-28 DIAGNOSIS — I10 Essential (primary) hypertension: Secondary | ICD-10-CM | POA: Diagnosis not present

## 2017-01-28 DIAGNOSIS — C7931 Secondary malignant neoplasm of brain: Secondary | ICD-10-CM | POA: Diagnosis not present

## 2017-01-28 DIAGNOSIS — R000 Tachycardia, unspecified: Secondary | ICD-10-CM | POA: Diagnosis not present

## 2017-01-28 DIAGNOSIS — C3432 Malignant neoplasm of lower lobe, left bronchus or lung: Secondary | ICD-10-CM | POA: Diagnosis not present

## 2017-01-28 DIAGNOSIS — Z4789 Encounter for other orthopedic aftercare: Secondary | ICD-10-CM | POA: Diagnosis not present

## 2017-01-28 DIAGNOSIS — M25512 Pain in left shoulder: Secondary | ICD-10-CM | POA: Diagnosis not present

## 2017-01-29 ENCOUNTER — Ambulatory Visit
Admission: RE | Admit: 2017-01-29 | Discharge: 2017-01-29 | Disposition: A | Discharge status: Home / Self Care | Payer: 59 | Source: Ambulatory Visit | Attending: Radiation Oncology | Admitting: Radiation Oncology

## 2017-01-29 DIAGNOSIS — C7931 Secondary malignant neoplasm of brain: Secondary | ICD-10-CM | POA: Diagnosis not present

## 2017-01-29 DIAGNOSIS — Z510 Encounter for antineoplastic radiation therapy: Secondary | ICD-10-CM | POA: Diagnosis not present

## 2017-01-29 DIAGNOSIS — C3432 Malignant neoplasm of lower lobe, left bronchus or lung: Secondary | ICD-10-CM | POA: Diagnosis not present

## 2017-02-01 ENCOUNTER — Ambulatory Visit (HOSPITAL_BASED_OUTPATIENT_CLINIC_OR_DEPARTMENT_OTHER): Payer: 59

## 2017-02-01 ENCOUNTER — Ambulatory Visit (HOSPITAL_BASED_OUTPATIENT_CLINIC_OR_DEPARTMENT_OTHER): Payer: 59 | Admitting: Internal Medicine

## 2017-02-01 ENCOUNTER — Other Ambulatory Visit (HOSPITAL_BASED_OUTPATIENT_CLINIC_OR_DEPARTMENT_OTHER): Payer: 59

## 2017-02-01 ENCOUNTER — Ambulatory Visit
Admission: RE | Admit: 2017-02-01 | Discharge: 2017-02-01 | Disposition: A | Discharge status: Home / Self Care | Payer: 59 | Source: Ambulatory Visit | Attending: Radiation Oncology | Admitting: Radiation Oncology

## 2017-02-01 ENCOUNTER — Encounter: Payer: Self-pay | Admitting: Internal Medicine

## 2017-02-01 VITALS — BP 137/88 | HR 96 | Temp 98.0°F | Resp 19 | Ht 73.0 in | Wt 225.3 lb

## 2017-02-01 DIAGNOSIS — C7A1 Malignant poorly differentiated neuroendocrine tumors: Secondary | ICD-10-CM

## 2017-02-01 DIAGNOSIS — M75112 Incomplete rotator cuff tear or rupture of left shoulder, not specified as traumatic: Secondary | ICD-10-CM | POA: Diagnosis not present

## 2017-02-01 DIAGNOSIS — M25512 Pain in left shoulder: Secondary | ICD-10-CM | POA: Diagnosis not present

## 2017-02-01 DIAGNOSIS — Z5111 Encounter for antineoplastic chemotherapy: Secondary | ICD-10-CM

## 2017-02-01 DIAGNOSIS — Z7189 Other specified counseling: Secondary | ICD-10-CM

## 2017-02-01 DIAGNOSIS — R591 Generalized enlarged lymph nodes: Secondary | ICD-10-CM | POA: Diagnosis not present

## 2017-02-01 DIAGNOSIS — F419 Anxiety disorder, unspecified: Secondary | ICD-10-CM | POA: Diagnosis not present

## 2017-02-01 DIAGNOSIS — C7B8 Other secondary neuroendocrine tumors: Secondary | ICD-10-CM

## 2017-02-01 DIAGNOSIS — C7931 Secondary malignant neoplasm of brain: Secondary | ICD-10-CM | POA: Diagnosis not present

## 2017-02-01 DIAGNOSIS — K769 Liver disease, unspecified: Secondary | ICD-10-CM | POA: Diagnosis not present

## 2017-02-01 DIAGNOSIS — Z510 Encounter for antineoplastic radiation therapy: Secondary | ICD-10-CM | POA: Diagnosis not present

## 2017-02-01 DIAGNOSIS — C3492 Malignant neoplasm of unspecified part of left bronchus or lung: Secondary | ICD-10-CM

## 2017-02-01 DIAGNOSIS — Z4789 Encounter for other orthopedic aftercare: Secondary | ICD-10-CM | POA: Diagnosis not present

## 2017-02-01 DIAGNOSIS — C3432 Malignant neoplasm of lower lobe, left bronchus or lung: Secondary | ICD-10-CM | POA: Diagnosis not present

## 2017-02-01 LAB — COMPREHENSIVE METABOLIC PANEL
ALT: 29 U/L (ref 0–55)
AST: 15 U/L (ref 5–34)
Albumin: 4 g/dL (ref 3.5–5.0)
Alkaline Phosphatase: 77 U/L (ref 40–150)
Anion Gap: 11 mEq/L (ref 3–11)
BUN: 10.1 mg/dL (ref 7.0–26.0)
CO2: 26 mEq/L (ref 22–29)
Calcium: 10 mg/dL (ref 8.4–10.4)
Chloride: 105 mEq/L (ref 98–109)
Creatinine: 1 mg/dL (ref 0.7–1.3)
EGFR: 85 mL/min/{1.73_m2} — ABNORMAL LOW (ref >90)
Glucose: 116 mg/dl (ref 70–140)
Potassium: 4 mEq/L (ref 3.5–5.1)
Sodium: 142 mEq/L (ref 136–145)
Total Bilirubin: 0.36 mg/dL (ref 0.20–1.20)
Total Protein: 7 g/dL (ref 6.4–8.3)

## 2017-02-01 LAB — CBC WITH DIFFERENTIAL/PLATELET
BASO%: 0.7 % (ref 0.0–2.0)
Basophils Absolute: 0 10*3/uL (ref 0.0–0.1)
EOS%: 2 % (ref 0.0–7.0)
Eosinophils Absolute: 0.1 10*3/uL (ref 0.0–0.5)
HCT: 42.8 % (ref 38.4–49.9)
HGB: 14.5 g/dL (ref 13.0–17.1)
LYMPH%: 10.7 % — ABNORMAL LOW (ref 14.0–49.0)
MCH: 31.2 pg (ref 27.2–33.4)
MCHC: 33.9 g/dL (ref 32.0–36.0)
MCV: 92.2 fL (ref 79.3–98.0)
MONO#: 0.4 10*3/uL (ref 0.1–0.9)
MONO%: 10.5 % (ref 0.0–14.0)
NEUT#: 2.6 10*3/uL (ref 1.5–6.5)
NEUT%: 76.1 % — ABNORMAL HIGH (ref 39.0–75.0)
Platelets: 260 10*3/uL (ref 140–400)
RBC: 4.65 10*6/uL (ref 4.20–5.82)
RDW: 13.5 % (ref 11.0–14.6)
WBC: 3.4 10*3/uL — ABNORMAL LOW (ref 4.0–10.3)
lymph#: 0.4 10*3/uL — ABNORMAL LOW (ref 0.9–3.3)

## 2017-02-01 MED ORDER — DEXAMETHASONE SODIUM PHOSPHATE 100 MG/10ML IJ SOLN
20.0000 mg | Freq: Once | INTRAMUSCULAR | Status: AC
  Administered 2017-02-01: 20 mg via INTRAVENOUS
  Filled 2017-02-01: qty 2

## 2017-02-01 MED ORDER — PALONOSETRON HCL INJECTION 0.25 MG/5ML
INTRAVENOUS | Status: AC
  Filled 2017-02-01: qty 5

## 2017-02-01 MED ORDER — FAMOTIDINE IN NACL 20-0.9 MG/50ML-% IV SOLN
20.0000 mg | Freq: Once | INTRAVENOUS | Status: AC
  Administered 2017-02-01: 20 mg via INTRAVENOUS

## 2017-02-01 MED ORDER — CYANOCOBALAMIN 1000 MCG/ML IJ SOLN
INTRAMUSCULAR | Status: AC
  Filled 2017-02-01: qty 1

## 2017-02-01 MED ORDER — CARBOPLATIN CHEMO INJECTION 450 MG/45ML
281.6000 mg | Freq: Once | INTRAVENOUS | Status: AC
  Administered 2017-02-01: 280 mg via INTRAVENOUS
  Filled 2017-02-01: qty 28

## 2017-02-01 MED ORDER — FAMOTIDINE IN NACL 20-0.9 MG/50ML-% IV SOLN
INTRAVENOUS | Status: AC
  Filled 2017-02-01: qty 50

## 2017-02-01 MED ORDER — CYANOCOBALAMIN 1000 MCG/ML IJ SOLN
1000.0000 ug | Freq: Once | INTRAMUSCULAR | Status: AC
  Administered 2017-02-01: 1000 ug via INTRAMUSCULAR

## 2017-02-01 MED ORDER — DEXAMETHASONE 4 MG PO TABS: ORAL_TABLET | ORAL | 1 refills | Status: DC

## 2017-02-01 MED ORDER — DIPHENHYDRAMINE HCL 50 MG/ML IJ SOLN
INTRAMUSCULAR | Status: AC
  Filled 2017-02-01: qty 1

## 2017-02-01 MED ORDER — FOLIC ACID 1 MG PO TABS: 1.0000 mg | ORAL_TABLET | Freq: Every day | ORAL | 4 refills | Status: DC

## 2017-02-01 MED ORDER — DIPHENHYDRAMINE HCL 50 MG/ML IJ SOLN
50.0000 mg | Freq: Once | INTRAMUSCULAR | Status: AC
Start: 2017-02-01 — End: 2017-02-01
  Administered 2017-02-01: 50 mg via INTRAVENOUS

## 2017-02-01 MED ORDER — PALONOSETRON HCL INJECTION 0.25 MG/5ML
0.2500 mg | Freq: Once | INTRAVENOUS | Status: AC
  Administered 2017-02-01: 0.25 mg via INTRAVENOUS

## 2017-02-01 MED ORDER — SODIUM CHLORIDE 0.9 % IV SOLN
Freq: Once | INTRAVENOUS | Status: AC
  Administered 2017-02-01: 13:00:00 via INTRAVENOUS

## 2017-02-01 MED ORDER — DEXTROSE 5 % IV SOLN
45.0000 mg/m2 | Freq: Once | INTRAVENOUS | Status: AC
  Administered 2017-02-01: 102 mg via INTRAVENOUS
  Filled 2017-02-01: qty 17

## 2017-02-01 NOTE — Patient Instructions (Signed)
[facility] Discharge Instructions for Patients Receiving Chemotherapy  Today you received the following chemotherapy agents Taxol and Carboplatin. To help prevent nausea and vomiting after your treatment, we encourage you to take your nausea medication as directed.  If you develop nausea and vomiting that is not controlled by your nausea medication, call the clinic.   BELOW ARE SYMPTOMS THAT SHOULD BE REPORTED IMMEDIATELY:  *FEVER GREATER THAN 100.5 F  *CHILLS WITH OR WITHOUT FEVER  NAUSEA AND VOMITING THAT IS NOT CONTROLLED WITH YOUR NAUSEA MEDICATION  *UNUSUAL SHORTNESS OF BREATH  *UNUSUAL BRUISING OR BLEEDING  TENDERNESS IN MOUTH AND THROAT WITH OR WITHOUT PRESENCE OF ULCERS  *URINARY PROBLEMS  *BOWEL PROBLEMS  UNUSUAL RASH Items with * indicate a potential emergency and should be followed up as soon as possible.  Feel free to call the clinic you have any questions or concerns. The clinic phone number is [phone].  Please show the CHEMO ALERT CARD at check-in to the Emergency Department and triage nurse.    

## 2017-02-01 NOTE — Progress Notes (Signed)
DISCONTINUE ON PATHWAY REGIMEN - Non-Small Cell Lung     Administer weekly:     Paclitaxel      Carboplatin   **Always confirm dose/schedule in your pharmacy ordering system**    REASON: Other Reason PRIOR TREATMENT: [id number]: Carboplatin AUC=2 + Paclitaxel 45 mg/m2 Weekly During Radiation TREATMENT RESPONSE: Unable to Evaluate  START ON PATHWAY REGIMEN - Non-Small Cell Lung     A cycle is every 21 days:     Carboplatin      Pemetrexed      Bevacizumab   **Always confirm dose/schedule in your pharmacy ordering system**    Patient Characteristics: Stage IV Metastatic, Non Squamous, Initial Chemotherapy/Immunotherapy, PS = 0, 1, PD-L1 Expression Positive 1-49% (TPS) / Negative / Not Tested / Not a Candidate for Immunotherapy AJCC T Category: T1b Current Disease Status: Distant Metastases AJCC N Category: N2 AJCC M Category: M1c AJCC 8 Stage Grouping: IVB Histology: Non Squamous Cell ROS1 Rearrangement Status: Negative T790M Mutation Status: Not Applicable - EGFR Mutation Negative/Unknown Other Mutations/Biomarkers: No Other Actionable Mutations PD-L1 Expression Status: Test Not Ordered Chemotherapy/Immunotherapy LOT: Initial Chemotherapy/Immunotherapy Molecular Targeted Therapy: Not Appropriate ALK Translocation Status: Negative Would you be surprised if this patient died  in the next year? I would NOT be surprised if this patient died in the next year EGFR Mutation Status: Negative/Wild Type BRAF V600E Mutation Status: Negative Performance Status: PS = 0, 1  Intent of Therapy: Non-Curative / Palliative Intent, Discussed with Patient

## 2017-02-01 NOTE — Progress Notes (Signed)
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  PRIOR THERAPY: Stereotactic radiotherapy to a solitary brain metastasis under the care of Dr. Lisbeth Renshaw on 01/25/2017.  CURRENT THERAPY: Short course of concurrent chemoradiation with weekly carboplatin for AUC of 2 and paclitaxel 45 MG/M2 to the locally advanced disease in the chest. Status post 3 cycles.  INTERVAL HISTORY: Calvin Wagner 51 y.o. male returns to the clinic today for follow-up visit accompanied by his wife. The patient is feeling fine today with no specific complaints except for mild fatigue and occasional nausea after his chemotherapy. He tolerated the last 3 cycles of his treatment fairly well. He denied having any chest pain, shortness of breath, cough or hemoptysis. He denied having any headache or visual changes. He has no weight loss or night sweats. He denied having any fever or chills. Unfortunately the MRI of the liver showed metastatic lesions in the liver. He is here today for reevaluation and discussion of his treatment options based on the new findings.  MEDICAL HISTORY: Past Medical History:  Diagnosis Date  . Encounter for antineoplastic chemotherapy 12/31/2016  . GERD 11/08/2009   Qualifier: Diagnosis of  By: Ronnald Ramp MD, Arvid Right Goals of care, counseling/discussion 12/31/2016  . HYPERTENSION 11/08/2009   Qualifier: Diagnosis of  By: Ronnald Ramp MD, Arvid Right.   . Migraine 02/25/2012  . Pneumonia   . PONV (postoperative nausea and vomiting)   . Rotator cuff tear, left     ALLERGIES:  is  allergic to no known allergies.  MEDICATIONS:  Current Outpatient Prescriptions  Medication Sig Dispense Refill  . acetaminophen-codeine (TYLENOL #4) 300-60 MG tablet Take 1 tablet by mouth every 4 (four) hours as needed.  0  . amLODipine (NORVASC) 10 MG tablet Take 1 tablet (10 mg total) by mouth daily. 30 tablet 0  . budesonide-formoterol (SYMBICORT) 80-4.5 MCG/ACT inhaler Inhale 2 puffs into the lungs 2 (two) times daily. 1 Inhaler 5  . chlorpheniramine-HYDROcodone (TUSSIONEX) 10-8 MG/5ML SUER Take 5 mLs by mouth every 12 (twelve) hours as needed for cough. 473 mL 0  . emollient (BIAFINE) cream Apply 1 application topically daily.    Marland Kitchen enoxaparin (LOVENOX) 150 MG/ML injection Inject 1.02 mLs (155 mg total) into the skin daily. 30 mL 2  . LORazepam (ATIVAN) 0.5 MG tablet 1 tablet po q 4-6 hours prn anxiety and one po 30 minutes prior to radiation or MRI 30 tablet 0  . Multiple Vitamin (MULTIVITAMIN WITH MINERALS) TABS tablet Take 1 tablet by mouth daily.    . pantoprazole (PROTONIX) 40 MG tablet Take 40 mg by mouth daily.  2  . prochlorperazine (COMPAZINE) 10 MG tablet Take 1 tablet (10 mg total) by mouth every 6 (six) hours as needed for nausea or vomiting. 30 tablet 0  . sucralfate (CARAFATE) 1 GM/10ML suspension Take 10 mLs (1 g total) by mouth 4 (four) times daily -  with meals and at bedtime. 420 mL 0  . traMADol (ULTRAM) 50 MG tablet Take 1 tablet (50 mg total) by mouth every 12 (twelve) hours as needed. 30 tablet 0  No current facility-administered medications for this visit.     SURGICAL HISTORY:  Past Surgical History:  Procedure Laterality Date  . INGUINAL HERNIA REPAIR    . SHOULDER ARTHROSCOPY WITH ROTATOR CUFF REPAIR Left 11/12/2016   Procedure: SHOULDER ARTHROSCOPY WITH ROTATOR CUFF REPAIR AND SUBACROMIAL DECOMPRESSION;  Surgeon: Tania Ade, MD;  Location: [facility];  Service: Orthopedics;  Laterality: Left;  SHOULDER ARTHROSCOPY WITH ROTATOR CUFF REPAIR AND SUBACROMIAL  DECOMPRESSION  . TONSILLECTOMY    . TOTAL KNEE ARTHROPLASTY    . VIDEO BRONCHOSCOPY Bilateral 12/10/2016   Procedure: VIDEO BRONCHOSCOPY WITHOUT FLUORO;  Surgeon: Tanda Rockers, MD;  Location: [facility];  Service: Cardiopulmonary;  Laterality: Bilateral;    REVIEW OF SYSTEMS:  Constitutional: positive for fatigue Eyes: negative Ears, nose, mouth, throat, and face: negative Respiratory: negative Cardiovascular: negative Gastrointestinal: negative Genitourinary:negative Integument/breast: negative Hematologic/lymphatic: negative Musculoskeletal:negative Neurological: negative Behavioral/Psych: negative Endocrine: negative Allergic/Immunologic: negative   PHYSICAL EXAMINATION: General appearance: alert, cooperative, fatigued and no distress Head: Normocephalic, without obvious abnormality, atraumatic Neck: no adenopathy, no JVD, supple, symmetrical, trachea midline and thyroid not enlarged, symmetric, no tenderness/mass/nodules Lymph nodes: Cervical, supraclavicular, and axillary nodes normal. Resp: clear to auscultation bilaterally Back: symmetric, no curvature. ROM normal. No CVA tenderness. Cardio: regular rate and rhythm, S1, S2 normal, no murmur, click, rub or gallop GI: soft, non-tender; bowel sounds normal; no masses,  no organomegaly Extremities: extremities normal, atraumatic, no cyanosis or edema Neurologic: Alert and oriented X 3, normal strength and tone. Normal symmetric reflexes. Normal coordination and gait  ECOG PERFORMANCE STATUS: 1 - Symptomatic but completely ambulatory  Blood pressure 137/88, pulse 96, temperature 98 F (36.7 C), temperature source Oral, resp. rate 19, height '6\' 1"'$  (1.854 m), weight 225 lb 4.8 oz (102.2 kg), SpO2 100 %.  LABORATORY DATA: Lab Results  Component Value Date   WBC 3.4 (L) 02/01/2017   HGB 14.5 02/01/2017   HCT 42.8 02/01/2017   MCV 92.2 02/01/2017   PLT 260 02/01/2017      Chemistry      Component Value Date/Time   NA  141 01/25/2017 0921   K 3.7 01/25/2017 0921   CL 103 11/26/2016 0438   CO2 24 01/25/2017 0921   BUN 9.8 01/25/2017 0921   CREATININE 1.0 01/25/2017 0921      Component Value Date/Time   CALCIUM 9.9 01/25/2017 0921   ALKPHOS 73 01/25/2017 0921   AST 11 01/25/2017 0921   ALT 25 01/25/2017 0921   BILITOT 0.31 01/25/2017 0921       RADIOGRAPHIC STUDIES: Mr Jeri Cos UD Contrast  Result Date: 01/16/2017 CLINICAL DATA:  Follow-up metastatic lung cancer. EXAM: MRI HEAD WITHOUT AND WITH CONTRAST TECHNIQUE: Multiplanar, multiecho pulse sequences of the brain and surrounding structures were obtained without and with intravenous contrast. CONTRAST:  41m MULTIHANCE GADOBENATE DIMEGLUMINE 529 MG/ML IV SOLN COMPARISON:  MRI of the head January 02, 2017 FINDINGS: INTRACRANIAL CONTENTS: No reduced diffusion to suggest acute ischemia or hypercellular tumor. No susceptibility artifact to suggest hemorrhage. Stable appearance of 9 x 12 mm LEFT inferior frontal lobe rim enhancing mass a gray-white matter junction without marginal vasogenic edema. A few additional subcentimeter supratentorial white matter FLAIR T2 hyperintensities compatible with mild chronic small vessel ischemic disease, stable. No midline shift or mass effect. No abnormal extra-axial fluid collections or abnormal extra-axial enhancement VASCULAR: Normal major intracranial vascular flow voids present at skull base. SKULL AND UPPER CERVICAL SPINE: No abnormal sellar expansion. No suspicious calvarial bone marrow signal. Craniocervical junction  maintained. SINUSES/ORBITS: The mastoid air-cells and included paranasal sinuses are well-aerated.The included ocular globes and orbital contents are non-suspicious. OTHER: None. IMPRESSION: Stable appearance of solitary 9 x 12 mm LEFT inferior frontal lobe cystic mass most compatible with metastatic disease given patient's history of lung cancer. Electronically Signed   By: Elon Alas M.D.   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 Staging. Subsequent encounter. EXAM: MRI HEAD WITHOUT AND WITH CONTRAST TECHNIQUE: Multiplanar, multiecho pulse sequences of the brain and surrounding structures were obtained without and with intravenous contrast. CONTRAST:  71m MULTIHANCE GADOBENATE DIMEGLUMINE 529 MG/ML IV SOLN COMPARISON:  PET-CT 318. FINDINGS: Brain: 13 mm rim enhancing lesion in the anterior left inferior frontal gyrus, gyrus rectus (series 10, image 72). No surrounding edema or mass effect. No other abnormal brain enhancement. No restricted diffusion to suggest acute infarction. No midline shift, mass effect, ventriculomegaly, extra-axial collection or acute intracranial hemorrhage. Cervicomedullary junction and pituitary are within normal limits. GPearline Cablesand white matter signal is within normal limits. Vascular: Major intracranial vascular flow voids are preserved. Skull and upper cervical spine: Negative visualized cervical spine and spinal cord. Visible bone marrow signal is normal. Sinuses/Orbits: Normal orbits soft tissues. Visualized paranasal sinuses and mastoids are well pneumatized. Other: Visible internal auditory structures appear normal. Negative scalp soft tissues. IMPRESSION: 1. 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 No associated edema or mass effect. 2. Otherwise negative MRI appearance of the brain. Electronically Signed   By: HGenevie AnnM.D.   On: 01/02/2017 16:52   Mr Liver W Wo Contrast  Result Date: 01/20/2017 CLINICAL DATA:  Left lung cancer, questionable liver lesions. EXAM: MRI ABDOMEN WITHOUT AND WITH CONTRAST TECHNIQUE: Multiplanar multisequence MR imaging of the abdomen was performed both before and after the administration of intravenous contrast. CONTRAST:  277mMULTIHANCE  GADOBENATE DIMEGLUMINE 529 MG/ML IV SOLN COMPARISON:  12/17/2016 PET-CT FINDINGS: Lower chest: Left infrahilar mass is partially included on today's exam. A left lower lobe nodule is shown on image 12/8 measuring about 1.2 by 0.9 cm. Hepatobiliary: In segment 8 of the liver there is a 1.2 by 1.1 cm T2 hyperintense lesion on image 13/8 associated with transient hepatic attenuation difference, rim enhancement on early arterial phase images, and rim enhancement and internal enhancement on later phase images, appearance suspicious for a metastatic lesion. In segment 5 of the liver there is a 1.5 by 1.4 cm T2 hyperintense lesion on image 29/8 which is low in precontrast T1 signal and which demonstrates rim and central arterials phase enhancement and delayed central enhancement, appearance again most compatible with a metastatic focus in this setting. A 3 mm T2 hyperintense lesion in the dome of the right hepatic lobe on image 6/8 is probably a cyst but may merit surveillance. This lesion is technically too small to characterize even by MRI. The gallbladder is contracted.  No biliary dilatation. Pancreas:  Unremarkable Spleen:  Unremarkable Adrenals/Urinary Tract: Adrenal glands normal. Small cyst of the right kidney lower pole anteriorly. Kidneys otherwise unremarkable. Stomach/Bowel: There a few diverticula of the descending colon. Vascular/Lymphatic:  Unremarkable Other:  No supplemental non-categorized findings. Musculoskeletal: 1 cm rim enhancing lesion in the T10 vertebra slightly eccentric to the left common not entirely specific but concern is raised for osseous metastatic disease. IMPRESSION: 1.  Two small lesions are identified in the liver, one in segment 8 and the other in segment 5, both lesions have abnormal enhancement characteristics which in this setting are highly suspicious for metastatic disease. 2. Small rim enhancing lesion in the T10 vertebral body could reflect early metastatic disease. 3. Left  infrahilar mass with separate left lower lobe nodule. Electronically Signed   By: Van Clines M.D.   On: 01/20/2017 08:52    ASSESSMENT AND PLAN: This is a very pleasant 51 years old white male recently diagnosed with a stage IV non-small cell lung cancer, high-grade neuroendocrine carcinoma presented with locally advanced disease including a small left lower lobe pulmonary nodule in addition to left hilar and subcarinal lymphadenopathy as well as solitary brain metastasis and 2 liver lesions diagnosed in March 2018. He underwent stereotactic radiotherapy to the solitary brain metastasis and he is currently undergoing a short course of concurrent chemoradiation to the locally advanced disease. He is expected to complete his radiation therapy tomorrow. I had a lengthy discussion with the patient and his wife today about his current disease status and treatment options. I also discussed with him the goals of care. The patient and his wife understand that he has incurable condition and the treatment will be of palliative nature. I gave him the option of palliative care versus consideration of systemic chemotherapy. The patient is still interested in proceeding with systemic chemotherapy. I recommended for him a course of chemotherapy in the form of carboplatin for AUC of 5, Alimta 500 MG/M2 and Avastin 15 MG/KG every 3 weeks. I discussed with the patient adverse effects of this treatment including but not limited to alopecia, myelosuppression, nausea and vomiting, peripheral neuropathy, liver or renal dysfunction in addition to the adverse effect of Avastin including pulmonary hemorrhage, GI perforation, and wound healing delay as well as hypertension and proteinuria.. We will arrange for the patient to receive vitamin B 12 injection today. The patient would also receive prescription for Decadron 4 mg by mouth twice a day, the day before, day of and day after the chemotherapy in addition to folic acid 1  mg by mouth daily. He is expected to start the first cycle of this treatment on 02/15/2017. I will see him back for follow-up visit at that time. For anxiety, he will continue on Ativan as needed. The patient was advised to call immediately if he has any concerning symptoms in the interval. The patient voices understanding of current disease status and treatment options and is in agreement with the current care plan. All questions were answered. The patient knows to call the clinic with any problems, questions or concerns. We can certainly see the patient much sooner if necessary.  Disclaimer: This note was dictated with voice recognition software. Similar sounding words can inadvertently be transcribed and may not be corrected upon review.

## 2017-02-02 ENCOUNTER — Ambulatory Visit: Payer: Self-pay | Admitting: Radiation Oncology

## 2017-02-02 ENCOUNTER — Encounter: Payer: Self-pay | Admitting: Radiation Oncology

## 2017-02-02 ENCOUNTER — Ambulatory Visit
Admission: RE | Admit: 2017-02-02 | Discharge: 2017-02-02 | Disposition: A | Discharge status: Home / Self Care | Payer: 59 | Source: Ambulatory Visit | Attending: Radiation Oncology | Admitting: Radiation Oncology

## 2017-02-02 DIAGNOSIS — Z4789 Encounter for other orthopedic aftercare: Secondary | ICD-10-CM | POA: Diagnosis not present

## 2017-02-02 DIAGNOSIS — Z510 Encounter for antineoplastic radiation therapy: Secondary | ICD-10-CM | POA: Diagnosis not present

## 2017-02-02 DIAGNOSIS — M25512 Pain in left shoulder: Secondary | ICD-10-CM | POA: Diagnosis not present

## 2017-02-02 DIAGNOSIS — C7931 Secondary malignant neoplasm of brain: Secondary | ICD-10-CM | POA: Diagnosis not present

## 2017-02-02 DIAGNOSIS — M75112 Incomplete rotator cuff tear or rupture of left shoulder, not specified as traumatic: Secondary | ICD-10-CM | POA: Diagnosis not present

## 2017-02-02 DIAGNOSIS — C3432 Malignant neoplasm of lower lobe, left bronchus or lung: Secondary | ICD-10-CM | POA: Diagnosis not present

## 2017-02-03 ENCOUNTER — Telehealth: Payer: Self-pay | Admitting: Internal Medicine

## 2017-02-03 ENCOUNTER — Ambulatory Visit: Payer: 59

## 2017-02-03 NOTE — Telephone Encounter (Signed)
Scheduled appt per 4/23 - unable to reach patient, left message with appt date and time.

## 2017-02-04 ENCOUNTER — Encounter: Payer: Self-pay | Admitting: Radiation Oncology

## 2017-02-04 ENCOUNTER — Ambulatory Visit: Payer: 59

## 2017-02-04 DIAGNOSIS — M75112 Incomplete rotator cuff tear or rupture of left shoulder, not specified as traumatic: Secondary | ICD-10-CM | POA: Diagnosis not present

## 2017-02-04 DIAGNOSIS — Z4789 Encounter for other orthopedic aftercare: Secondary | ICD-10-CM | POA: Diagnosis not present

## 2017-02-04 DIAGNOSIS — M25512 Pain in left shoulder: Secondary | ICD-10-CM | POA: Diagnosis not present

## 2017-02-05 ENCOUNTER — Ambulatory Visit: Payer: 59

## 2017-02-08 ENCOUNTER — Ambulatory Visit: Payer: 59

## 2017-02-08 ENCOUNTER — Encounter: Payer: 59 | Admitting: Nutrition

## 2017-02-08 ENCOUNTER — Other Ambulatory Visit: Payer: 59

## 2017-02-08 ENCOUNTER — Encounter: Payer: Self-pay | Admitting: Radiation Oncology

## 2017-02-08 NOTE — Progress Notes (Signed)
  Radiation Oncology         [phone]) [phone] ________________________________  Name: Calvin Wagner MRN: [id number]  Date: 02/02/2017  DOB: 1966-02-21  End of Treatment Note  Diagnosis:  Stage IV (T1a, N2, M1b) non-small cell lung cancer consistent with poorly differentiated high-grade neuroendocrine carcinoma of the left lung with liver and brain metastases  Indication for treatment:  Palliative with concurrent chemoradiation  Radiation treatment dates:   01/13/17 - 02/02/17  Site/dose:  Left lung: 30 Gy in 15 fractions  Beams/energy:   3D // 6X Photon  Narrative: The patient tolerated radiation treatment relatively well. The patient continued having SOB with activity, a dry cough for which Tussionex BID helped, a scratchy throat for which he took Carafate, fatigue, and nausea after getting chemotherapy. The patient was noted to have an elevated HR and BP on the last few days of treatment and his cardiologist was aware of this. He is expected to start full dose chemotherapy soon.  Plan: The patient has completed radiation treatment. The patient will return to radiation oncology clinic for routine followup in one month. I advised them to call or return sooner if they have any questions or concerns related to their recovery or treatment.  -----------------------------------  Blair Promise, PhD, MD  This document serves as a record of services personally performed by Gery Pray, MD. It was created on his behalf by Darcus Austin, a trained medical scribe. The creation of this record is based on the scribe's personal observations and the provider's statements to them. This document has been checked and approved by the attending provider.

## 2017-02-09 ENCOUNTER — Ambulatory Visit: Payer: 59

## 2017-02-10 ENCOUNTER — Ambulatory Visit: Payer: 59

## 2017-02-11 ENCOUNTER — Ambulatory Visit: Payer: 59

## 2017-02-12 ENCOUNTER — Ambulatory Visit: Payer: 59

## 2017-02-12 DIAGNOSIS — M25512 Pain in left shoulder: Secondary | ICD-10-CM | POA: Diagnosis not present

## 2017-02-12 DIAGNOSIS — M75112 Incomplete rotator cuff tear or rupture of left shoulder, not specified as traumatic: Secondary | ICD-10-CM | POA: Diagnosis not present

## 2017-02-12 DIAGNOSIS — Z4789 Encounter for other orthopedic aftercare: Secondary | ICD-10-CM | POA: Diagnosis not present

## 2017-02-15 ENCOUNTER — Ambulatory Visit: Payer: 59

## 2017-02-15 ENCOUNTER — Ambulatory Visit (HOSPITAL_BASED_OUTPATIENT_CLINIC_OR_DEPARTMENT_OTHER): Payer: 59 | Admitting: Internal Medicine

## 2017-02-15 ENCOUNTER — Other Ambulatory Visit (HOSPITAL_BASED_OUTPATIENT_CLINIC_OR_DEPARTMENT_OTHER): Payer: 59

## 2017-02-15 ENCOUNTER — Ambulatory Visit: Payer: 59 | Admitting: Nutrition

## 2017-02-15 ENCOUNTER — Telehealth: Payer: Self-pay | Admitting: Internal Medicine

## 2017-02-15 ENCOUNTER — Encounter: Payer: Self-pay | Admitting: Internal Medicine

## 2017-02-15 ENCOUNTER — Ambulatory Visit (HOSPITAL_BASED_OUTPATIENT_CLINIC_OR_DEPARTMENT_OTHER): Payer: 59

## 2017-02-15 VITALS — BP 136/97 | HR 100 | Temp 98.8°F | Resp 18 | Ht 73.0 in | Wt 222.4 lb

## 2017-02-15 VITALS — BP 133/93

## 2017-02-15 DIAGNOSIS — C7B8 Other secondary neuroendocrine tumors: Secondary | ICD-10-CM

## 2017-02-15 DIAGNOSIS — C3492 Malignant neoplasm of unspecified part of left bronchus or lung: Secondary | ICD-10-CM

## 2017-02-15 DIAGNOSIS — Z5112 Encounter for antineoplastic immunotherapy: Secondary | ICD-10-CM

## 2017-02-15 DIAGNOSIS — Z5111 Encounter for antineoplastic chemotherapy: Secondary | ICD-10-CM | POA: Diagnosis not present

## 2017-02-15 DIAGNOSIS — C7A1 Malignant poorly differentiated neuroendocrine tumors: Secondary | ICD-10-CM | POA: Diagnosis not present

## 2017-02-15 DIAGNOSIS — K769 Liver disease, unspecified: Secondary | ICD-10-CM

## 2017-02-15 LAB — COMPREHENSIVE METABOLIC PANEL
ALT: 33 U/L (ref 0–55)
AST: 20 U/L (ref 5–34)
Albumin: 3.9 g/dL (ref 3.5–5.0)
Alkaline Phosphatase: 85 U/L (ref 40–150)
Anion Gap: 11 mEq/L (ref 3–11)
BUN: 10.8 mg/dL (ref 7.0–26.0)
CO2: 26 mEq/L (ref 22–29)
Calcium: 9.8 mg/dL (ref 8.4–10.4)
Chloride: 105 mEq/L (ref 98–109)
Creatinine: 1.1 mg/dL (ref 0.7–1.3)
EGFR: 75 mL/min/{1.73_m2} — ABNORMAL LOW (ref >90)
Glucose: 100 mg/dl (ref 70–140)
Potassium: 4 mEq/L (ref 3.5–5.1)
Sodium: 142 mEq/L (ref 136–145)
Total Bilirubin: 0.39 mg/dL (ref 0.20–1.20)
Total Protein: 7.1 g/dL (ref 6.4–8.3)

## 2017-02-15 LAB — CBC WITH DIFFERENTIAL/PLATELET
BASO%: 0.5 % (ref 0.0–2.0)
Basophils Absolute: 0 10*3/uL (ref 0.0–0.1)
EOS%: 1.9 % (ref 0.0–7.0)
Eosinophils Absolute: 0.1 10*3/uL (ref 0.0–0.5)
HCT: 42.7 % (ref 38.4–49.9)
HGB: 14.5 g/dL (ref 13.0–17.1)
LYMPH%: 11.8 % — ABNORMAL LOW (ref 14.0–49.0)
MCH: 31.4 pg (ref 27.2–33.4)
MCHC: 33.9 g/dL (ref 32.0–36.0)
MCV: 92.8 fL (ref 79.3–98.0)
MONO#: 0.4 10*3/uL (ref 0.1–0.9)
MONO%: 11.5 % (ref 0.0–14.0)
NEUT#: 2.5 10*3/uL (ref 1.5–6.5)
NEUT%: 74.3 % (ref 39.0–75.0)
Platelets: 192 10*3/uL (ref 140–400)
RBC: 4.6 10*6/uL (ref 4.20–5.82)
RDW: 14.9 % — ABNORMAL HIGH (ref 11.0–14.6)
WBC: 3.4 10*3/uL — ABNORMAL LOW (ref 4.0–10.3)
lymph#: 0.4 10*3/uL — ABNORMAL LOW (ref 0.9–3.3)

## 2017-02-15 LAB — UA PROTEIN, DIPSTICK - CHCC: Protein, ur: NEGATIVE mg/dL

## 2017-02-15 MED ORDER — SODIUM CHLORIDE 0.9 % IV SOLN
14.7000 mg/kg | Freq: Once | INTRAVENOUS | Status: AC
  Administered 2017-02-15: 1500 mg via INTRAVENOUS
  Filled 2017-02-15: qty 48

## 2017-02-15 MED ORDER — PALONOSETRON HCL INJECTION 0.25 MG/5ML
INTRAVENOUS | Status: AC
  Filled 2017-02-15: qty 5

## 2017-02-15 MED ORDER — SODIUM CHLORIDE 0.9 % IV SOLN
Freq: Once | INTRAVENOUS | Status: AC
  Administered 2017-02-15: 14:00:00 via INTRAVENOUS
  Filled 2017-02-15: qty 5

## 2017-02-15 MED ORDER — PALONOSETRON HCL INJECTION 0.25 MG/5ML
0.2500 mg | Freq: Once | INTRAVENOUS | Status: AC
  Administered 2017-02-15: 0.25 mg via INTRAVENOUS

## 2017-02-15 MED ORDER — SODIUM CHLORIDE 0.9 % IV SOLN
Freq: Once | INTRAVENOUS | Status: AC
  Administered 2017-02-15: 12:00:00 via INTRAVENOUS

## 2017-02-15 MED ORDER — SODIUM CHLORIDE 0.9 % IV SOLN
750.0000 mg | Freq: Once | INTRAVENOUS | Status: AC
  Administered 2017-02-15: 750 mg via INTRAVENOUS
  Filled 2017-02-15: qty 75

## 2017-02-15 MED ORDER — SODIUM CHLORIDE 0.9 % IV SOLN
520.0000 mg/m2 | Freq: Once | INTRAVENOUS | Status: AC
  Administered 2017-02-15: 1200 mg via INTRAVENOUS
  Filled 2017-02-15: qty 40

## 2017-02-15 NOTE — Patient Instructions (Signed)
[facility] Discharge Instructions for Patients Receiving Chemotherapy  Today you received the following chemotherapy agents: Avastin, Alimta and Carboplatin   To help prevent nausea and vomiting after your treatment, we encourage you to take your nausea medication as directed.    If you develop nausea and vomiting that is not controlled by your nausea medication, call the clinic.   BELOW ARE SYMPTOMS THAT SHOULD BE REPORTED IMMEDIATELY:  *FEVER GREATER THAN 100.5 F  *CHILLS WITH OR WITHOUT FEVER  NAUSEA AND VOMITING THAT IS NOT CONTROLLED WITH YOUR NAUSEA MEDICATION  *UNUSUAL SHORTNESS OF BREATH  *UNUSUAL BRUISING OR BLEEDING  TENDERNESS IN MOUTH AND THROAT WITH OR WITHOUT PRESENCE OF ULCERS  *URINARY PROBLEMS  *BOWEL PROBLEMS  UNUSUAL RASH Items with * indicate a potential emergency and should be followed up as soon as possible.  Feel free to call the clinic you have any questions or concerns. The clinic phone number is [phone].  Please show the [place] at check-in to the Emergency Department and triage nurse.  Bevacizumab injection (Avastin) What is this medicine? BEVACIZUMAB (be va SIZ yoo mab) is a monoclonal antibody. It is used to treat many types of cancer. This medicine may be used for other purposes; ask your health care provider or pharmacist if you have questions. COMMON BRAND NAME(S): Avastin What should I tell my health care provider before I take this medicine? They need to know if you have any of these conditions: -diabetes -heart disease -high blood pressure -history of coughing up blood -prior anthracycline chemotherapy (e.g., doxorubicin, daunorubicin, epirubicin) -recent or ongoing radiation therapy -recent or planning to have surgery -stroke -an unusual or allergic reaction to bevacizumab, hamster proteins, mouse proteins, other medicines, foods, dyes, or preservatives -pregnant or trying to get  pregnant -breast-feeding How should I use this medicine? This medicine is for infusion into a vein. It is given by a health care professional in a hospital or clinic setting. Talk to your pediatrician regarding the use of this medicine in children. Special care may be needed. Overdosage: If you think you have taken too much of this medicine contact a poison control center or emergency room at once. NOTE: This medicine is only for you. Do not share this medicine with others. What if I miss a dose? It is important not to miss your dose. Call your doctor or health care professional if you are unable to keep an appointment. What may interact with this medicine? Interactions are not expected. This list may not describe all possible interactions. Give your health care provider a list of all the medicines, herbs, non-prescription drugs, or dietary supplements you use. Also tell them if you smoke, drink alcohol, or use illegal drugs. Some items may interact with your medicine. What should I watch for while using this medicine? Your condition will be monitored carefully while you are receiving this medicine. You will need important blood work and urine testing done while you are taking this medicine. This medicine may increase your risk to bruise or bleed. Call your doctor or health care professional if you notice any unusual bleeding. This medicine should be started at least 28 days following major surgery and the site of the surgery should be totally healed. Check with your doctor before scheduling dental work or surgery while you are receiving this treatment. Talk to your doctor if you have recently had surgery or if you have a wound that has not healed. Do not become pregnant while taking this  medicine or for 6 months after stopping it. Women should inform their doctor if they wish to become pregnant or think they might be pregnant. There is a potential for serious side effects to an unborn child. Talk to  your health care professional or pharmacist for more information. Do not breast-feed an infant while taking this medicine and for 6 months after the last dose. This medicine has caused ovarian failure in some women. This medicine may interfere with the ability to have a child. You should talk to your doctor or health care professional if you are concerned about your fertility. What side effects may I notice from receiving this medicine? Side effects that you should report to your doctor or health care professional as soon as possible: -allergic reactions like skin rash, itching or hives, swelling of the face, lips, or tongue -chest pain or chest tightness -chills -coughing up blood -high fever -seizures -severe constipation -signs and symptoms of bleeding such as bloody or black, tarry stools; red or dark-brown urine; spitting up blood or brown material that looks like coffee grounds; red spots on the skin; unusual bruising or bleeding from the eye, gums, or nose -signs and symptoms of a blood clot such as breathing problems; chest pain; severe, sudden headache; pain, swelling, warmth in the leg -signs and symptoms of a stroke like changes in vision; confusion; trouble speaking or understanding; severe headaches; sudden numbness or weakness of the face, arm or leg; trouble walking; dizziness; loss of balance or coordination -stomach pain -sweating -swelling of legs or ankles -vomiting -weight gain Side effects that usually do not require medical attention (report to your doctor or health care professional if they continue or are bothersome): -back pain -changes in taste -decreased appetite -dry skin -nausea -tiredness This list may not describe all possible side effects. Call your doctor for medical advice about side effects. You may report side effects to FDA at 1-800-FDA-1088. Where should I keep my medicine? This drug is given in a hospital or clinic and will not be stored at  home. NOTE: This sheet is a summary. It may not cover all possible information. If you have questions about this medicine, talk to your doctor, pharmacist, or health care provider.  2018 [employer]vier/Gold Standard (2016-09-25 14:33:29)   Pemetrexed injection (Alimta) What is this medicine? PEMETREXED (PEM e TREX ed) is a chemotherapy drug used to treat lung cancers like non-small cell lung cancer and mesothelioma. It may also be used to treat other cancers. This medicine may be used for other purposes; ask your health care provider or pharmacist if you have questions. COMMON BRAND NAME(S): Alimta What should I tell my health care provider before I take this medicine? They need to know if you have any of these conditions: -infection (especially a virus infection such as chickenpox, cold sores, or herpes) -kidney disease -low blood counts, like low white cell, platelet, or red cell counts -lung or breathing disease, like asthma -radiation therapy -an unusual or allergic reaction to pemetrexed, other medicines, foods, dyes, or preservative -pregnant or trying to get pregnant -breast-feeding How should I use this medicine? This drug is given as an infusion into a vein. It is administered in a hospital or clinic by a specially trained health care professional. Talk to your pediatrician regarding the use of this medicine in children. Special care may be needed. Overdosage: If you think you have taken too much of this medicine contact a poison control center or emergency room at once. NOTE: This  medicine is only for you. Do not share this medicine with others. What if I miss a dose? It is important not to miss your dose. Call your doctor or health care professional if you are unable to keep an appointment. What may interact with this medicine? This medicine may interact with the following medications: -Ibuprofen This list may not describe all possible interactions. Give your health care provider  a list of all the medicines, herbs, non-prescription drugs, or dietary supplements you use. Also tell them if you smoke, drink alcohol, or use illegal drugs. Some items may interact with your medicine. What should I watch for while using this medicine? Visit your doctor for checks on your progress. This drug may make you feel generally unwell. This is not uncommon, as chemotherapy can affect healthy cells as well as cancer cells. Report any side effects. Continue your course of treatment even though you feel ill unless your doctor tells you to stop. In some cases, you may be given additional medicines to help with side effects. Follow all directions for their use. Call your doctor or health care professional for advice if you get a fever, chills or sore throat, or other symptoms of a cold or flu. Do not treat yourself. This drug decreases your body's ability to fight infections. Try to avoid being around people who are sick. This medicine may increase your risk to bruise or bleed. Call your doctor or health care professional if you notice any unusual bleeding. Be careful brushing and flossing your teeth or using a toothpick because you may get an infection or bleed more easily. If you have any dental work done, tell your dentist you are receiving this medicine. Avoid taking products that contain aspirin, acetaminophen, ibuprofen, naproxen, or ketoprofen unless instructed by your doctor. These medicines may hide a fever. Call your doctor or health care professional if you get diarrhea or mouth sores. Do not treat yourself. To protect your kidneys, drink water or other fluids as directed while you are taking this medicine. Do not become pregnant while taking this medicine or for 6 months after stopping it. Women should inform their doctor if they wish to become pregnant or think they might be pregnant. Men should not father a child while taking this medicine and for 3 months after stopping it. This may  interfere with the ability to father a child. You should talk to your doctor or health care professional if you are concerned about your fertility. There is a potential for serious side effects to an unborn child. Talk to your health care professional or pharmacist for more information. Do not breast-feed an infant while taking this medicine or for 1 week after stopping it. What side effects may I notice from receiving this medicine? Side effects that you should report to your doctor or health care professional as soon as possible: -allergic reactions like skin rash, itching or hives, swelling of the face, lips, or tongue -breathing problems -redness, blistering, peeling or loosening of the skin, including inside the mouth -signs and symptoms of bleeding such as bloody or black, tarry stools; red or dark-brown urine; spitting up blood or brown material that looks like coffee grounds; red spots on the skin; unusual bruising or bleeding from the eye, gums, or nose -signs and symptoms of infection like fever or chills; cough; sore throat; pain or trouble passing urine -signs and symptoms of kidney injury like trouble passing urine or change in the amount of urine -signs and symptoms of  liver injury like dark yellow or brown urine; general ill feeling or flu-like symptoms; light-colored stools; loss of appetite; nausea; right upper belly pain; unusually weak or tired; yellowing of the eyes or skin Side effects that usually do not require medical attention (report to your doctor or health care professional if they continue or are bothersome): -constipation -dizziness -mouth sores -nausea, vomiting -pain, tingling, numbness in the hands or feet -unusually weak or tired This list may not describe all possible side effects. Call your doctor for medical advice about side effects. You may report side effects to FDA at 1-800-FDA-1088. Where should I keep my medicine? This drug is given in a hospital or  clinic and will not be stored at home. NOTE: This sheet is a summary. It may not cover all possible information. If you have questions about this medicine, talk to your doctor, pharmacist, or health care provider.  2018 [employer]vier/Gold Standard (2016-07-28 18:51:46)

## 2017-02-15 NOTE — Telephone Encounter (Signed)
Scheduled additional lab appts per 5/7 los. - Patient to pick up new scheduled in treatmetnt area,.

## 2017-02-15 NOTE — Progress Notes (Signed)
51 year old male diagnosed with non-small cell lung cancer.  He is a patient of Dr. Julien Nordmann.  Past medical history includes migraines, hypertension, and GERD.  Medications include Ativan, multivitamin, Protonix, Compazine, and Carafate.  Labs include albumin 4.1.  Height: 6 feet 1 inch. Weight: 222.4 pounds on May 7. Usual body weight: 225 pounds. BMI: 29.34.  I met with both patient and his wife, during chemotherapy. Patient denies nutrition impact symptoms at this time. He did have a little nausea which was relieved by nausea medication.  Nutrition diagnosis:  Food and nutrition related knowledge deficit related to new diagnosis of lung cancer as evidenced by no prior need for nutrition related information.  Intervention: Educated patient to consume smaller more frequent meals and snacks utilizing high-calorie, high-protein foods to promote weight maintenance. Educated patient on strategies for improving nausea and vomiting. Answered patient's questions about sugar and cancer. Provided fact sheets and contact information.  Questions were answered.  Teach back method used.  Monitoring, evaluation, goals:  Patient will consume adequate calories and protein to minimize weight loss.  Next visit: Patient will contact me if he has further questions or concerns. Nutrition diagnosis resolved.  **Disclaimer: This note was dictated with voice recognition software. Similar sounding words can inadvertently be transcribed and this note may contain transcription errors which may not have been corrected upon publication of note.**

## 2017-02-15 NOTE — Progress Notes (Signed)
[place] [phone]   [phone]  OFFICE PROGRESS NOTE  [name], MD 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  PRIOR THERAPY:  1) Stereotactic radiotherapy to a solitary brain metastasis under the care of Dr. Lisbeth Renshaw on 01/25/2017. 2) Short course of concurrent chemoradiation with weekly carboplatin for AUC of 2 and paclitaxel 45 MG/M2 to the locally advanced disease in the chest. Status post 3 cycles.  CURRENT THERAPY: Systemic chemotherapy with carboplatin for AUC of 5, Alimta 500 MG/M2 and Avastin 15 MG/KG every 3 weeks. First dose of 02/15/2017.  INTERVAL HISTORY: Calvin Wagner 51 y.o. male clinic today for follow-up visit accompanied by his wife. The patient is feeling fine today with no specific complaints. He denied having any chest pain, shortness of breath, cough or hemoptysis. He has no fever or chills. He has no nausea or vomiting. The patient has no weight loss or night sweats. He is here today to start the first cycle of systemic chemotherapy was carboplatin, Alimta and Avastin.  MEDICAL HISTORY: Past Medical History:  Diagnosis Date  . Encounter for antineoplastic chemotherapy 12/31/2016  . GERD 11/08/2009   Qualifier: Diagnosis of  By: Ronnald Ramp MD, Arvid Right Goals of care, counseling/discussion 12/31/2016  . HYPERTENSION 11/08/2009   Qualifier: Diagnosis of  By: Ronnald Ramp MD, Arvid Right.   . Large cell carcinoma of left lung, stage 4 ([facility]) 12/31/2016  . Migraine 02/25/2012  . Pneumonia   . PONV (postoperative nausea and vomiting)   . Rotator cuff tear, left     ALLERGIES:  is allergic to no known  allergies.  MEDICATIONS:  Current Outpatient Prescriptions  Medication Sig Dispense Refill  . budesonide-formoterol (SYMBICORT) 80-4.5 MCG/ACT inhaler Inhale 2 puffs into the lungs 2 (two) times daily. 1 Inhaler 5  . chlorpheniramine-HYDROcodone (TUSSIONEX) 10-8 MG/5ML SUER Take 5 mLs by mouth every 12 (twelve) hours as needed for cough. 473 mL 0  . dexamethasone (DECADRON) 4 MG tablet 4 mg by mouth twice a day the day before, day of and day after the chemotherapy 40 tablet 1  . emollient (BIAFINE) cream Apply 1 application topically daily.    Marland Kitchen enoxaparin (LOVENOX) 150 MG/ML injection Inject 1.02 mLs (155 mg total) into the skin daily. 30 mL 2  . folic acid (FOLVITE) 1 MG tablet Take 1 tablet (1 mg total) by mouth daily. 30 tablet 4  . LORazepam (ATIVAN) 0.5 MG tablet 1 tablet po q 4-6 hours prn anxiety and one po 30 minutes prior to radiation or MRI 30 tablet 0  . losartan (COZAAR) 25 MG tablet Take 25 mg by mouth daily.  1  . Multiple Vitamin (MULTIVITAMIN WITH MINERALS) TABS tablet Take 1 tablet by mouth daily.    . pantoprazole (PROTONIX) 40 MG tablet Take 40 mg by mouth daily.  2  . prochlorperazine (COMPAZINE) 10 MG tablet Take 1 tablet (10 mg total) by mouth every 6 (six) hours as needed for nausea or vomiting. 30 tablet 0  . sucralfate (CARAFATE) 1 GM/10ML suspension Take 10 mLs (1 g total) by mouth 4 (four) times daily -  with meals and at bedtime. 420 mL 0  . acetaminophen-codeine (TYLENOL #4)  300-60 MG tablet Take 1 tablet by mouth every 4 (four) hours as needed.  0  . traMADol (ULTRAM) 50 MG tablet Take 1 tablet (50 mg total) by mouth every 12 (twelve) hours as needed. (Patient not taking: Reported on 02/15/2017) 30 tablet 0   No current facility-administered medications for this visit.     SURGICAL HISTORY:  Past Surgical History:  Procedure Laterality Date  . INGUINAL HERNIA REPAIR    . SHOULDER ARTHROSCOPY WITH ROTATOR CUFF REPAIR Left 11/12/2016   Procedure: SHOULDER  ARTHROSCOPY WITH ROTATOR CUFF REPAIR AND SUBACROMIAL DECOMPRESSION;  Surgeon: Tania Ade, MD;  Location: [facility];  Service: Orthopedics;  Laterality: Left;  SHOULDER ARTHROSCOPY WITH ROTATOR CUFF REPAIR AND SUBACROMIAL DECOMPRESSION  . TONSILLECTOMY    . TOTAL KNEE ARTHROPLASTY    . VIDEO BRONCHOSCOPY Bilateral 12/10/2016   Procedure: VIDEO BRONCHOSCOPY WITHOUT FLUORO;  Surgeon: Tanda Rockers, MD;  Location: [facility];  Service: Cardiopulmonary;  Laterality: Bilateral;    REVIEW OF SYSTEMS:  A comprehensive review of systems was negative.   PHYSICAL EXAMINATION: General appearance: alert, cooperative and no distress Head: Normocephalic, without obvious abnormality, atraumatic Neck: no adenopathy, no JVD, supple, symmetrical, trachea midline and thyroid not enlarged, symmetric, no tenderness/mass/nodules Lymph nodes: Cervical, supraclavicular, and axillary nodes normal. Resp: clear to auscultation bilaterally Back: symmetric, no curvature. ROM normal. No CVA tenderness. Cardio: regular rate and rhythm, S1, S2 normal, no murmur, click, rub or gallop GI: soft, non-tender; bowel sounds normal; no masses,  no organomegaly Extremities: extremities normal, atraumatic, no cyanosis or edema  ECOG PERFORMANCE STATUS: 0 - Asymptomatic  Blood pressure (!) 136/97, pulse 100, temperature 98.8 F (37.1 C), temperature source Oral, resp. rate 18, height '6\' 1"'$  (1.854 m), weight 222 lb 6.4 oz (100.9 kg), SpO2 99 %.  LABORATORY DATA: Lab Results  Component Value Date   WBC 3.4 (L) 02/15/2017   HGB 14.5 02/15/2017   HCT 42.7 02/15/2017   MCV 92.8 02/15/2017   PLT 192 02/15/2017      Chemistry      Component Value Date/Time   NA 142 02/15/2017 1007   K 4.0 02/15/2017 1007   CL 103 11/26/2016 0438   CO2 26 02/15/2017 1007   BUN 10.8 02/15/2017 1007   CREATININE 1.1 02/15/2017 1007      Component Value Date/Time   CALCIUM 9.8 02/15/2017 1007   ALKPHOS 85 02/15/2017 1007   AST 20  02/15/2017 1007   ALT 33 02/15/2017 1007   BILITOT 0.39 02/15/2017 1007       RADIOGRAPHIC STUDIES: Mr Jeri Cos KX Contrast  Result Date: 01/16/2017 CLINICAL DATA:  Follow-up metastatic lung cancer. EXAM: MRI HEAD WITHOUT AND WITH CONTRAST TECHNIQUE: Multiplanar, multiecho pulse sequences of the brain and surrounding structures were obtained without and with intravenous contrast. CONTRAST:  12m MULTIHANCE GADOBENATE DIMEGLUMINE 529 MG/ML IV SOLN COMPARISON:  MRI of the head January 02, 2017 FINDINGS: INTRACRANIAL CONTENTS: No reduced diffusion to suggest acute ischemia or hypercellular tumor. No susceptibility artifact to suggest hemorrhage. Stable appearance of 9 x 12 mm LEFT inferior frontal lobe rim enhancing mass a gray-white matter junction without marginal vasogenic edema. A few additional subcentimeter supratentorial white matter FLAIR T2 hyperintensities compatible with mild chronic small vessel ischemic disease, stable. No midline shift or mass effect. No abnormal extra-axial fluid collections or abnormal extra-axial enhancement VASCULAR: Normal major intracranial vascular flow voids present at skull base. SKULL AND UPPER CERVICAL SPINE: No abnormal sellar expansion. No suspicious calvarial bone marrow signal.  Craniocervical junction maintained. SINUSES/ORBITS: The mastoid air-cells and included paranasal sinuses are well-aerated.The included ocular globes and orbital contents are non-suspicious. OTHER: None. IMPRESSION: Stable appearance of solitary 9 x 12 mm LEFT inferior frontal lobe cystic mass most compatible with metastatic disease given patient's history of lung cancer. Electronically Signed   By: Elon Alas M.D.   On: 01/16/2017 20:43   Mr Liver W Wo Contrast  Result Date: 01/20/2017 CLINICAL DATA:  Left lung cancer, questionable liver lesions. EXAM: MRI ABDOMEN WITHOUT AND WITH CONTRAST TECHNIQUE: Multiplanar multisequence MR imaging of the abdomen was performed both before and  after the administration of intravenous contrast. CONTRAST:  62m MULTIHANCE GADOBENATE DIMEGLUMINE 529 MG/ML IV SOLN COMPARISON:  12/17/2016 PET-CT FINDINGS: Lower chest: Left infrahilar mass is partially included on today's exam. A left lower lobe nodule is shown on image 12/8 measuring about 1.2 by 0.9 cm. Hepatobiliary: In segment 8 of the liver there is a 1.2 by 1.1 cm T2 hyperintense lesion on image 13/8 associated with transient hepatic attenuation difference, rim enhancement on early arterial phase images, and rim enhancement and internal enhancement on later phase images, appearance suspicious for a metastatic lesion. In segment 5 of the liver there is a 1.5 by 1.4 cm T2 hyperintense lesion on image 29/8 which is low in precontrast T1 signal and which demonstrates rim and central arterials phase enhancement and delayed central enhancement, appearance again most compatible with a metastatic focus in this setting. A 3 mm T2 hyperintense lesion in the dome of the right hepatic lobe on image 6/8 is probably a cyst but may merit surveillance. This lesion is technically too small to characterize even by MRI. The gallbladder is contracted.  No biliary dilatation. Pancreas:  Unremarkable Spleen:  Unremarkable Adrenals/Urinary Tract: Adrenal glands normal. Small cyst of the right kidney lower pole anteriorly. Kidneys otherwise unremarkable. Stomach/Bowel: There a few diverticula of the descending colon. Vascular/Lymphatic:  Unremarkable Other:  No supplemental non-categorized findings. Musculoskeletal: 1 cm rim enhancing lesion in the T10 vertebra slightly eccentric to the left common not entirely specific but concern is raised for osseous metastatic disease. IMPRESSION: 1. Two small lesions are identified in the liver, one in segment 8 and the other in segment 5, both lesions have abnormal enhancement characteristics which in this setting are highly suspicious for metastatic disease. 2. Small rim enhancing lesion  in the T10 vertebral body could reflect early metastatic disease. 3. Left infrahilar mass with separate left lower lobe nodule. Electronically Signed   By: WVan ClinesM.D.   On: 01/20/2017 08:52    ASSESSMENT AND PLAN: This is a very pleasant 51years old white male with a stage IV non-small cell lung cancer, high-grade neuroendocrine carcinoma presented with locally advanced disease in the chest as well as solitary brain metastasis and 2 liver lesions. He underwent stereotactic radiotherapy to the solitary brain metastases in addition to short course of concurrent chemoradiation to the locally advanced disease. The patient is here today to start the first cycle of treatment with carboplatin, Alimta and Avastin. The patient is feeling fine and I will proceed with his treatment today as scheduled. I will see the patient back for follow-up visit in 3 weeks for evaluation before starting cycle #3. He was advised to call immediately if he has any concerning symptoms in the interval. The patient voices understanding of current disease status and treatment options and is in agreement with the current care plan. All questions were answered. The patient knows to call  the clinic with any problems, questions or concerns. We can certainly see the patient much sooner if necessary. I spent 10 minutes counseling the patient face to face. The total time spent in the appointment was 15 minutes.  Disclaimer: This note was dictated with voice recognition software. Similar sounding words can inadvertently be transcribed and may not be corrected upon review.

## 2017-02-16 ENCOUNTER — Telehealth: Payer: Self-pay | Admitting: Medical Oncology

## 2017-02-16 ENCOUNTER — Ambulatory Visit: Admission: RE | Admit: 2017-02-16 | Payer: 59 | Source: Ambulatory Visit

## 2017-02-16 NOTE — Progress Notes (Signed)
  Radiation Oncology         [phone]) [phone] ________________________________  Name: Calvin Wagner MRN: [id number]  Date: 01/25/2017  DOB: 1966/02/20  End of Treatment Note  Diagnosis:   Stage IV(T1a, N2, M1b) non-small cell lung cancer consistent with poorly differentiated high-grade neuroendocrine carcinoma of the left lung with liver and brain metastases  Indication for treatment:  Palliative with concurrent chemotherapy  Radiation treatment dates:   01/25/17  Site/dose:   PTV1 L Inf Frontal 37m: 20 Gy in 1 fraction  Beams/energy:   SRS // 6XFFF Photon  Narrative: The patient tolerated radiation treatment relatively well. The patient denied headaches, pain, nausea, blurry vision, or light headedness.  Plan: The patient has completed radiation treatment. The patient will return to radiation oncology clinic for routine followup in one month. I advised them to call or return sooner if they have any questions or concerns related to their recovery or treatment.  ------------------------------------------------  JJodelle Gross MD, PhD  This document serves as a record of services personally performed by JKyung Rudd MD. It was created on his behalf by JDarcus Austin a trained medical scribe. The creation of this record is based on the scribe's personal observations and the provider's statements to them. This document has been checked and approved by the attending provider.

## 2017-02-16 NOTE — Telephone Encounter (Signed)
Unable to contact pt or leave message.

## 2017-02-17 ENCOUNTER — Ambulatory Visit: Payer: 59

## 2017-02-17 DIAGNOSIS — C7931 Secondary malignant neoplasm of brain: Secondary | ICD-10-CM | POA: Insufficient documentation

## 2017-02-17 NOTE — Progress Notes (Signed)
  Radiation Oncology         [phone]) [phone] ________________________________  Name: ZAYIN VALADEZ MRN: [id number]  Date: 01/25/2017  DOB: 1966-08-17   SPECIAL TREATMENT PROCEDURE   3D TREATMENT PLANNING AND DOSIMETRY: The patient's radiation plan was reviewed and approved by Dr. Elenor Legato from neurosurgery and radiation oncology prior to treatment. It showed 3-dimensional radiation distributions overlaid onto the planning CT/MRI image set. The [facility] for the target structures as well as the organs at risk were reviewed. The documentation of the 3D plan and dosimetry are filed in the radiation oncology EMR.   NARRATIVE: The patient was brought to the TrueBeam stereotactic radiation treatment machine and placed supine on the CT couch. The head frame was applied, and the patient was set up for stereotactic radiosurgery. Neurosurgery was present for the set-up and delivery   SIMULATION VERIFICATION: In the couch zero-angle position, the patient underwent Exactrac imaging using the Brainlab system with orthogonal KV images. These were carefully aligned and repeated to confirm treatment position for each of the isocenters. The Exactrac snap film verification was repeated at each couch angle.   SPECIAL TREATMENT PROCEDURE: The patient received stereotactic radiosurgery to the following target:  PTV1 L Inf Frontal 44m target was treated using 3 Arcs to a prescription dose of 20 Gy. ExacTrac Snap verification was performed for each couch angle.   STEREOTACTIC TREATMENT MANAGEMENT: Following delivery, the patient was transported to nursing in stable condition and monitored for possible acute effects. Vital signs were recorded . The patient tolerated treatment without significant acute effects, and was discharged to home in stable condition.  PLAN: Follow-up in one month.   ------------------------------------------------  JJodelle Gross MD, PhD

## 2017-02-17 NOTE — Progress Notes (Signed)
  Radiation Oncology         [phone]) [phone] ________________________________  Name: ENGELBERT SEVIN MRN: [id number]  Date: 01/20/2017  DOB: 1965-12-06  DIAGNOSIS:     ICD-9-CM ICD-10-CM   1. Large cell carcinoma of left lung, stage 3 ([facility]) 162.9 C34.92 LORazepam (ATIVAN) tablet 0.5 mg  2. Brain metastasis ([facility]) 198.3 C79.31     NARRATIVE:  The patient was brought to the [facility].  Identity was confirmed.  All relevant records and images related to the planned course of therapy were reviewed.  The patient freely provided informed written consent to proceed with treatment after reviewing the details related to the planned course of therapy. The consent form was witnessed and verified by the simulation staff. Intravenous access was established for contrast administration. Then, the patient was set-up in a stable reproducible supine position for radiation therapy.  A relocatable thermoplastic stereotactic head frame was fabricated for precise immobilization.  CT images were obtained.  Surface markings were placed.  The CT images were loaded into the planning software and fused with the patient's targeting MRI scan.  Then the target and avoidance structures were contoured.  Treatment planning then occurred.  The radiation prescription was entered and confirmed.  I have requested 3D planning  I have requested a DVH of the following structures: Brain stem, brain, left eye, right eye, lenses, optic chiasm, target volumes, uninvolved brain, and normal tissue.    SPECIAL TREATMENT PROCEDURE:  The planned course of therapy using radiation constitutes a special treatment procedure. Special care is required in the management of this patient for the following reasons. This treatment constitutes a Special Treatment Procedure for the following reason: High dose per fraction requiring special monitoring for increased toxicities of treatment including daily imaging.  The special nature of the planned  course of radiotherapy will require increased physician supervision and oversight to ensure patient's safety with optimal treatment outcomes.  PLAN:  The patient will receive 20 Gy in 1 fraction.   ------------------------------------------------  Jodelle Gross, MD, PhD

## 2017-02-18 ENCOUNTER — Ambulatory Visit: Payer: 59

## 2017-02-18 ENCOUNTER — Other Ambulatory Visit: Payer: Self-pay | Admitting: Internal Medicine

## 2017-02-18 DIAGNOSIS — Z4789 Encounter for other orthopedic aftercare: Secondary | ICD-10-CM | POA: Diagnosis not present

## 2017-02-18 DIAGNOSIS — M25512 Pain in left shoulder: Secondary | ICD-10-CM | POA: Diagnosis not present

## 2017-02-18 DIAGNOSIS — M75112 Incomplete rotator cuff tear or rupture of left shoulder, not specified as traumatic: Secondary | ICD-10-CM | POA: Diagnosis not present

## 2017-02-19 ENCOUNTER — Ambulatory Visit: Payer: 59

## 2017-02-22 ENCOUNTER — Ambulatory Visit: Payer: 59

## 2017-02-22 ENCOUNTER — Other Ambulatory Visit: Payer: 59

## 2017-02-23 ENCOUNTER — Ambulatory Visit: Payer: 59

## 2017-02-23 ENCOUNTER — Other Ambulatory Visit: Payer: 59

## 2017-02-25 NOTE — Addendum Note (Signed)
Encounter addended by: Consuella Lose, MD on: 02/25/2017 10:30 AM<BR>    Actions taken: Sign clinical note

## 2017-02-25 NOTE — Op Note (Signed)
Name: Calvin Wagner    MRN: [id number]   Date: 01/25/2017    DOB: 02-05-1966   STEREOTACTIC RADIOSURGERY OPERATIVE NOTE  PRE-OPERATIVE DIAGNOSIS:  Metastatic lung CA  POST-OPERATIVE DIAGNOSIS:  Same  PROCEDURE:  Stereotactic Radiosurgery  SURGEON:  Consuella Lose, MD  RADIATION ONCOLOGIST: Dr. Kyung Rudd, MD  TECHNIQUE:  The patient underwent a radiation treatment planning session in the radiation oncology simulation suite under the care of the radiation oncology physician and physicist.  I participated closely in the radiation treatment planning afterwards. The patient underwent planning CT which was fused to 3T high resolution MRI with 1 mm axial slices.  These images were fused on the planning system.  We contoured the gross target volumes and subsequently expanded this to yield the Planning Target Volume. I actively participated in the planning process.  I helped to define and review the target contours and also the contours of the optic pathway, eyes, brainstem and selected nearby organs at risk.  All the dose constraints for critical structures were reviewed and compared to AAPM Task Group 101.  The prescription dose conformity was reviewed.  I approved the plan electronically.    Accordingly, Calvin Wagner  was brought to the TrueBeam stereotactic radiation treatment linac and placed in the custom immobilization mask.  The patient was aligned according to the IR fiducial markers with BrainLab Exactrac, then orthogonal x-rays were used in ExacTrac with the 6DOF robotic table and the shifts were made to align the patient  Calvin Wagner received stereotactic radiosurgery to a prescription dose of 20Gy uneventfully to the left frontal lesion.  The detailed description of the procedure is recorded in the radiation oncology procedure note.  I was present for the duration of the procedure.  DISPOSITION:   Following delivery, the patient was transported to nursing in stable condition and  monitored for possible acute effects to be discharged to home in stable condition with follow-up in one month.  Consuella Lose, MD [facility]

## 2017-03-02 ENCOUNTER — Other Ambulatory Visit (HOSPITAL_BASED_OUTPATIENT_CLINIC_OR_DEPARTMENT_OTHER): Payer: 59

## 2017-03-02 DIAGNOSIS — C7A1 Malignant poorly differentiated neuroendocrine tumors: Secondary | ICD-10-CM

## 2017-03-02 DIAGNOSIS — C3492 Malignant neoplasm of unspecified part of left bronchus or lung: Secondary | ICD-10-CM

## 2017-03-02 LAB — COMPREHENSIVE METABOLIC PANEL
ALT: 42 U/L (ref 0–55)
AST: 24 U/L (ref 5–34)
Albumin: 3.8 g/dL (ref 3.5–5.0)
Alkaline Phosphatase: 87 U/L (ref 40–150)
Anion Gap: 9 mEq/L (ref 3–11)
BUN: 10.6 mg/dL (ref 7.0–26.0)
CO2: 27 mEq/L (ref 22–29)
Calcium: 9.8 mg/dL (ref 8.4–10.4)
Chloride: 105 mEq/L (ref 98–109)
Creatinine: 1.1 mg/dL (ref 0.7–1.3)
EGFR: 76 mL/min/{1.73_m2} — ABNORMAL LOW (ref >90)
Glucose: 139 mg/dl (ref 70–140)
Potassium: 4.5 mEq/L (ref 3.5–5.1)
Sodium: 141 mEq/L (ref 136–145)
Total Bilirubin: 0.35 mg/dL (ref 0.20–1.20)
Total Protein: 7.1 g/dL (ref 6.4–8.3)

## 2017-03-02 LAB — CBC WITH DIFFERENTIAL/PLATELET
BASO%: 0.3 % (ref 0.0–2.0)
Basophils Absolute: 0 10*3/uL (ref 0.0–0.1)
EOS%: 1.2 % (ref 0.0–7.0)
Eosinophils Absolute: 0 10*3/uL (ref 0.0–0.5)
HCT: 39 % (ref 38.4–49.9)
HGB: 13.3 g/dL (ref 13.0–17.1)
LYMPH%: 21.2 % (ref 14.0–49.0)
MCH: 31.8 pg (ref 27.2–33.4)
MCHC: 34.2 g/dL (ref 32.0–36.0)
MCV: 92.9 fL (ref 79.3–98.0)
MONO#: 0.3 10*3/uL (ref 0.1–0.9)
MONO%: 12.8 % (ref 0.0–14.0)
NEUT#: 1.3 10*3/uL — ABNORMAL LOW (ref 1.5–6.5)
NEUT%: 64.5 % (ref 39.0–75.0)
Platelets: 130 10*3/uL — ABNORMAL LOW (ref 140–400)
RBC: 4.2 10*6/uL (ref 4.20–5.82)
RDW: 15.9 % — ABNORMAL HIGH (ref 11.0–14.6)
WBC: 2.1 10*3/uL — ABNORMAL LOW (ref 4.0–10.3)
lymph#: 0.4 10*3/uL — ABNORMAL LOW (ref 0.9–3.3)

## 2017-03-05 ENCOUNTER — Ambulatory Visit (HOSPITAL_BASED_OUTPATIENT_CLINIC_OR_DEPARTMENT_OTHER): Payer: 59 | Admitting: Internal Medicine

## 2017-03-05 ENCOUNTER — Encounter: Payer: Self-pay | Admitting: Internal Medicine

## 2017-03-05 VITALS — BP 147/93 | HR 93 | Temp 98.7°F | Resp 18 | Ht 73.0 in | Wt 225.9 lb

## 2017-03-05 DIAGNOSIS — C7A1 Malignant poorly differentiated neuroendocrine tumors: Secondary | ICD-10-CM | POA: Diagnosis not present

## 2017-03-05 DIAGNOSIS — R05 Cough: Secondary | ICD-10-CM | POA: Diagnosis not present

## 2017-03-05 DIAGNOSIS — C3492 Malignant neoplasm of unspecified part of left bronchus or lung: Secondary | ICD-10-CM

## 2017-03-05 DIAGNOSIS — R110 Nausea: Secondary | ICD-10-CM

## 2017-03-05 DIAGNOSIS — C7931 Secondary malignant neoplasm of brain: Secondary | ICD-10-CM

## 2017-03-05 DIAGNOSIS — C7B8 Other secondary neuroendocrine tumors: Secondary | ICD-10-CM

## 2017-03-05 DIAGNOSIS — Z5111 Encounter for antineoplastic chemotherapy: Secondary | ICD-10-CM

## 2017-03-05 MED ORDER — HYDROCOD POLST-CPM POLST ER 10-8 MG/5ML PO SUER: 5.0000 mL | Freq: Two times a day (BID) | ORAL | 0 refills | Status: DC | PRN

## 2017-03-05 NOTE — Progress Notes (Signed)
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  PRIOR THERAPY:  1) Stereotactic radiotherapy to a solitary brain metastasis under the care of Dr. Lisbeth Renshaw on 01/25/2017. 2) Short course of concurrent chemoradiation with weekly carboplatin for AUC of 2 and paclitaxel 45 MG/M2 to the locally advanced disease in the chest. Status post 3 cycles.  CURRENT THERAPY: Systemic chemotherapy with carboplatin for AUC of 5, Alimta 500 MG/M2 and Avastin 15 MG/KG every 3 weeks. First dose of 02/15/2017. Status post one cycle.   INTERVAL HISTORY: Calvin Wagner 51 y.o. male returns to the clinic today for follow-up visit. The patient tolerated the first cycle of his systemic chemotherapy with carboplatin, Alimta and Avastin fairly well except for nausea for a few days after his treatment. He takes Compazine with improvement of his nausea. He continues to have mild cough. He denied having any chest pain, shortness of breath or hemoptysis. He denied having any significant weight loss or night sweats. He has no fever or chills. He is here today for evaluation before starting cycle #2 of his chemotherapy early next week.   MEDICAL HISTORY: Past Medical History:  Diagnosis Date  . Encounter for antineoplastic chemotherapy 12/31/2016  . GERD 11/08/2009   Qualifier: Diagnosis of  By: Ronnald Ramp MD, Arvid Right Goals of care, counseling/discussion 12/31/2016  . HYPERTENSION 11/08/2009   Qualifier: Diagnosis of  By: Ronnald Ramp MD, Arvid Right.   . Large cell carcinoma of left lung, stage 4 ([place])  12/31/2016  . Migraine 02/25/2012  . Pneumonia   . PONV (postoperative nausea and vomiting)   . Rotator cuff tear, left     ALLERGIES:  is allergic to no known allergies.  MEDICATIONS:  Current Outpatient Prescriptions  Medication Sig Dispense Refill  . budesonide-formoterol (SYMBICORT) 80-4.5 MCG/ACT inhaler Inhale 2 puffs into the lungs 2 (two) times daily. 1 Inhaler 5  . chlorpheniramine-HYDROcodone (TUSSIONEX) 10-8 MG/5ML SUER Take 5 mLs by mouth every 12 (twelve) hours as needed for cough. 473 mL 0  . dexamethasone (DECADRON) 4 MG tablet 4 mg by mouth twice a day the day before, day of and day after the chemotherapy 40 tablet 1  . emollient (BIAFINE) cream Apply 1 application topically daily.    Marland Kitchen enoxaparin (LOVENOX) 150 MG/ML injection Inject 1.02 mLs (155 mg total) into the skin daily. 30 mL 2  . folic acid (FOLVITE) 1 MG tablet Take 1 tablet (1 mg total) by mouth daily. 30 tablet 4  . losartan (COZAAR) 25 MG tablet Take 25 mg by mouth daily.  1  . Multiple Vitamin (MULTIVITAMIN WITH MINERALS) TABS tablet Take 1 tablet by mouth daily.    . pantoprazole (PROTONIX) 40 MG tablet Take 40 mg by mouth daily.  2  . prochlorperazine (COMPAZINE) 10 MG tablet Take 1 tablet (10 mg total) by mouth every 6 (six) hours as needed for nausea or vomiting. 30 tablet 0  . sucralfate (CARAFATE) 1 GM/10ML suspension Take 10 mLs (1 g total) by mouth 4 (four) times daily -  with meals and at bedtime. 420 mL 0  . traMADol (ULTRAM)  50 MG tablet Take 1 tablet (50 mg total) by mouth every 12 (twelve) hours as needed. 30 tablet 0  . acetaminophen-codeine (TYLENOL #4) 300-60 MG tablet Take 1 tablet by mouth every 4 (four) hours as needed.  0  . LORazepam (ATIVAN) 0.5 MG tablet 1 tablet po q 4-6 hours prn anxiety and one po 30 minutes prior to radiation or MRI (Patient not taking: Reported on 03/05/2017) 30 tablet 0   No current facility-administered medications for this visit.     SURGICAL HISTORY:  Past  Surgical History:  Procedure Laterality Date  . INGUINAL HERNIA REPAIR    . SHOULDER ARTHROSCOPY WITH ROTATOR CUFF REPAIR Left 11/12/2016   Procedure: SHOULDER ARTHROSCOPY WITH ROTATOR CUFF REPAIR AND SUBACROMIAL DECOMPRESSION;  Surgeon: Tania Ade, MD;  Location: [facility];  Service: Orthopedics;  Laterality: Left;  SHOULDER ARTHROSCOPY WITH ROTATOR CUFF REPAIR AND SUBACROMIAL DECOMPRESSION  . TONSILLECTOMY    . TOTAL KNEE ARTHROPLASTY    . VIDEO BRONCHOSCOPY Bilateral 12/10/2016   Procedure: VIDEO BRONCHOSCOPY WITHOUT FLUORO;  Surgeon: Tanda Rockers, MD;  Location: [facility];  Service: Cardiopulmonary;  Laterality: Bilateral;    REVIEW OF SYSTEMS:  A comprehensive review of systems was negative except for: Constitutional: positive for fatigue Respiratory: positive for cough Gastrointestinal: positive for nausea   PHYSICAL EXAMINATION: General appearance: alert, cooperative, fatigued and no distress Head: Normocephalic, without obvious abnormality, atraumatic Neck: no adenopathy, no JVD, supple, symmetrical, trachea midline and thyroid not enlarged, symmetric, no tenderness/mass/nodules Lymph nodes: Cervical, supraclavicular, and axillary nodes normal. Resp: clear to auscultation bilaterally Back: symmetric, no curvature. ROM normal. No CVA tenderness. Cardio: regular rate and rhythm, S1, S2 normal, no murmur, click, rub or gallop GI: soft, non-tender; bowel sounds normal; no masses,  no organomegaly Extremities: extremities normal, atraumatic, no cyanosis or edema  ECOG PERFORMANCE STATUS: 0 - Asymptomatic  Blood pressure (!) 147/93, pulse 93, temperature 98.7 F (37.1 C), temperature source Oral, resp. rate 18, height 6\' 1"  (1.854 m), weight 225 lb 14.4 oz (102.5 kg), SpO2 100 %.  LABORATORY DATA: Lab Results  Component Value Date   WBC 2.1 (L) 03/02/2017   HGB 13.3 03/02/2017   HCT 39.0 03/02/2017   MCV 92.9 03/02/2017   PLT 130 (L) 03/02/2017      Chemistry        Component Value Date/Time   NA 141 03/02/2017 1243   K 4.5 03/02/2017 1243   CL 103 11/26/2016 0438   CO2 27 03/02/2017 1243   BUN 10.6 03/02/2017 1243   CREATININE 1.1 03/02/2017 1243      Component Value Date/Time   CALCIUM 9.8 03/02/2017 1243   ALKPHOS 87 03/02/2017 1243   AST 24 03/02/2017 1243   ALT 42 03/02/2017 1243   BILITOT 0.35 03/02/2017 1243       RADIOGRAPHIC STUDIES: No results found.  ASSESSMENT AND PLAN: This is a very pleasant 51 years old white male with a stage IV non-small cell lung cancer, high-grade neuroendocrine carcinoma presented with locally advanced disease in the chest as well as solitary brain metastasis and 2 liver lesions. He underwent stereotactic radiotherapy to the solitary brain metastases in addition to short course of concurrent chemoradiation to the locally advanced disease in the chest. The patient was started on systemic chemotherapy with carboplatin, Alimta and Avastin status post 1 cycle. He tolerated the first cycle of his treatment well except for mild nausea for a few days after the treatment improved with Compazine. I recommended for the patient  to proceed with cycle #2 early next week as scheduled. I would see him back for follow-up visit in 3 weeks for reevaluation with repeat CT scan of the chest, abdomen and pelvis for restaging of his disease before proceeding with any further treatment. For nausea he will continue on Compazine as needed. For the cough, I gave the patient refill of Tussionex. He was advised to call immediately if she has any concerning symptoms in the interval. The patient voices understanding of current disease status and treatment options and is in agreement with the current care plan. All questions were answered. The patient knows to call the clinic with any problems, questions or concerns. We can certainly see the patient much sooner if necessary. I spent 10 minutes counseling the patient face to face. The total  time spent in the appointment was 15 minutes.  Disclaimer: This note was dictated with voice recognition software. Similar sounding words can inadvertently be transcribed and may not be corrected upon review.

## 2017-03-09 ENCOUNTER — Other Ambulatory Visit (HOSPITAL_BASED_OUTPATIENT_CLINIC_OR_DEPARTMENT_OTHER): Payer: 59

## 2017-03-09 ENCOUNTER — Ambulatory Visit (HOSPITAL_BASED_OUTPATIENT_CLINIC_OR_DEPARTMENT_OTHER): Payer: 59

## 2017-03-09 ENCOUNTER — Ambulatory Visit
Admission: RE | Admit: 2017-03-09 | Discharge: 2017-03-09 | Disposition: A | Discharge status: Home / Self Care | Payer: 59 | Source: Ambulatory Visit | Attending: Radiation Oncology | Admitting: Radiation Oncology

## 2017-03-09 ENCOUNTER — Encounter: Payer: Self-pay | Admitting: Radiation Oncology

## 2017-03-09 VITALS — BP 148/99 | HR 105 | Temp 98.2°F | Resp 18 | Ht 73.0 in | Wt 225.4 lb

## 2017-03-09 DIAGNOSIS — C7931 Secondary malignant neoplasm of brain: Secondary | ICD-10-CM | POA: Insufficient documentation

## 2017-03-09 DIAGNOSIS — C3492 Malignant neoplasm of unspecified part of left bronchus or lung: Secondary | ICD-10-CM

## 2017-03-09 DIAGNOSIS — Z5112 Encounter for antineoplastic immunotherapy: Secondary | ICD-10-CM

## 2017-03-09 DIAGNOSIS — C787 Secondary malignant neoplasm of liver and intrahepatic bile duct: Secondary | ICD-10-CM | POA: Diagnosis not present

## 2017-03-09 DIAGNOSIS — C7A1 Malignant poorly differentiated neuroendocrine tumors: Secondary | ICD-10-CM | POA: Diagnosis not present

## 2017-03-09 DIAGNOSIS — Z5111 Encounter for antineoplastic chemotherapy: Secondary | ICD-10-CM | POA: Diagnosis not present

## 2017-03-09 DIAGNOSIS — C7B8 Other secondary neuroendocrine tumors: Secondary | ICD-10-CM

## 2017-03-09 DIAGNOSIS — C3490 Malignant neoplasm of unspecified part of unspecified bronchus or lung: Secondary | ICD-10-CM | POA: Insufficient documentation

## 2017-03-09 LAB — CBC WITH DIFFERENTIAL/PLATELET
BASO%: 0.3 % (ref 0.0–2.0)
Basophils Absolute: 0 10*3/uL (ref 0.0–0.1)
EOS%: 0 % (ref 0.0–7.0)
Eosinophils Absolute: 0 10*3/uL (ref 0.0–0.5)
HCT: 40.7 % (ref 38.4–49.9)
HGB: 14.2 g/dL (ref 13.0–17.1)
LYMPH%: 6.8 % — ABNORMAL LOW (ref 14.0–49.0)
MCH: 32.7 pg (ref 27.2–33.4)
MCHC: 34.9 g/dL (ref 32.0–36.0)
MCV: 93.8 fL (ref 79.3–98.0)
MONO#: 0.1 10*3/uL (ref 0.1–0.9)
MONO%: 2.8 % (ref 0.0–14.0)
NEUT#: 3.3 10*3/uL (ref 1.5–6.5)
NEUT%: 90.1 % — ABNORMAL HIGH (ref 39.0–75.0)
Platelets: 368 10*3/uL (ref 140–400)
RBC: 4.34 10*6/uL (ref 4.20–5.82)
RDW: 17.3 % — ABNORMAL HIGH (ref 11.0–14.6)
WBC: 3.6 10*3/uL — ABNORMAL LOW (ref 4.0–10.3)
lymph#: 0.2 10*3/uL — ABNORMAL LOW (ref 0.9–3.3)

## 2017-03-09 LAB — COMPREHENSIVE METABOLIC PANEL
ALT: 36 U/L (ref 0–55)
AST: 15 U/L (ref 5–34)
Albumin: 4.1 g/dL (ref 3.5–5.0)
Alkaline Phosphatase: 83 U/L (ref 40–150)
Anion Gap: 12 mEq/L — ABNORMAL HIGH (ref 3–11)
BUN: 8.9 mg/dL (ref 7.0–26.0)
CO2: 21 mEq/L — ABNORMAL LOW (ref 22–29)
Calcium: 10.3 mg/dL (ref 8.4–10.4)
Chloride: 104 mEq/L (ref 98–109)
Creatinine: 1 mg/dL (ref 0.7–1.3)
EGFR: 85 mL/min/{1.73_m2} — ABNORMAL LOW (ref >90)
Glucose: 152 mg/dl — ABNORMAL HIGH (ref 70–140)
Potassium: 4.6 mEq/L (ref 3.5–5.1)
Sodium: 137 mEq/L (ref 136–145)
Total Bilirubin: 0.39 mg/dL (ref 0.20–1.20)
Total Protein: 7.6 g/dL (ref 6.4–8.3)

## 2017-03-09 LAB — UA PROTEIN, DIPSTICK - CHCC: Protein, ur: NEGATIVE mg/dL

## 2017-03-09 MED ORDER — SODIUM CHLORIDE 0.9 % IV SOLN
14.7000 mg/kg | Freq: Once | INTRAVENOUS | Status: AC
  Administered 2017-03-09: 1500 mg via INTRAVENOUS
  Filled 2017-03-09: qty 48

## 2017-03-09 MED ORDER — SODIUM CHLORIDE 0.9 % IV SOLN
Freq: Once | INTRAVENOUS | Status: AC
  Administered 2017-03-09: 10:00:00 via INTRAVENOUS

## 2017-03-09 MED ORDER — PALONOSETRON HCL INJECTION 0.25 MG/5ML
INTRAVENOUS | Status: AC
  Filled 2017-03-09: qty 5

## 2017-03-09 MED ORDER — PALONOSETRON HCL INJECTION 0.25 MG/5ML
0.2500 mg | Freq: Once | INTRAVENOUS | Status: AC
  Administered 2017-03-09: 0.25 mg via INTRAVENOUS

## 2017-03-09 MED ORDER — SODIUM CHLORIDE 0.9 % IV SOLN
520.0000 mg/m2 | Freq: Once | INTRAVENOUS | Status: AC
  Administered 2017-03-09: 1200 mg via INTRAVENOUS
  Filled 2017-03-09: qty 40

## 2017-03-09 MED ORDER — SODIUM CHLORIDE 0.9 % IV SOLN
Freq: Once | INTRAVENOUS | Status: AC
  Administered 2017-03-09: 10:00:00 via INTRAVENOUS
  Filled 2017-03-09: qty 5

## 2017-03-09 MED ORDER — SODIUM CHLORIDE 0.9 % IV SOLN
705.5000 mg | Freq: Once | INTRAVENOUS | Status: AC
  Administered 2017-03-09: 710 mg via INTRAVENOUS
  Filled 2017-03-09: qty 71

## 2017-03-09 NOTE — Progress Notes (Signed)
Radiation Oncology         [phone]) [phone] ________________________________  Name: Calvin Wagner MRN: [id number]  Date: 03/09/2017  DOB: March 19, 1966  Post Treatment Note  CC: [name], MD  Tanda Rockers, MD  Diagnosis:   Stage IV(T1a, N2, M1b) non-small cell lung cancer consistent with poorly differentiated high-grade neuroendocrine carcinoma of the left lung with liver and brain metastases  Interval Since Last Radiation: 5 weeks   01/13/17 - 02/02/17: 1. Left lung: 30 Gy in 15 fractions  01/25/17 SRS Treatment: 1. PTV1 L Inf Frontal 65mm: 20 Gy in 1 fraction   Narrative:  The patient returns today for routine follow-up. He reports that he is doing well overall. He denies any concerns with skin changes, progressive headaches, nausea, vomiting, shortness of breath, fevers, chills, cough, hematemesis. No other complaints are verbalized.                         ALLERGIES:  is allergic to no known allergies.  Meds: Current Outpatient Prescriptions  Medication Sig Dispense Refill  . budesonide-formoterol (SYMBICORT) 80-4.5 MCG/ACT inhaler Inhale 2 puffs into the lungs 2 (two) times daily. 1 Inhaler 5  . chlorpheniramine-HYDROcodone (TUSSIONEX) 10-8 MG/5ML SUER Take 5 mLs by mouth every 12 (twelve) hours as needed for cough. 473 mL 0  . dexamethasone (DECADRON) 4 MG tablet 4 mg by mouth twice a day the day before, day of and day after the chemotherapy 40 tablet 1  . enoxaparin (LOVENOX) 150 MG/ML injection Inject 1.02 mLs (155 mg total) into the skin daily. 30 mL 2  . folic acid (FOLVITE) 1 MG tablet Take 1 tablet (1 mg total) by mouth daily. 30 tablet 4  . LORazepam (ATIVAN) 0.5 MG tablet 1 tablet po q 4-6 hours prn anxiety and one po 30 minutes prior to radiation or MRI 30 tablet 0  . losartan (COZAAR) 25 MG tablet Take 25 mg by mouth daily.  1  . Multiple Vitamin (MULTIVITAMIN WITH MINERALS) TABS tablet Take 1 tablet by mouth daily.    . pantoprazole (PROTONIX) 40 MG tablet Take  40 mg by mouth daily.  2  . prochlorperazine (COMPAZINE) 10 MG tablet Take 1 tablet (10 mg total) by mouth every 6 (six) hours as needed for nausea or vomiting. 30 tablet 0  . sucralfate (CARAFATE) 1 GM/10ML suspension Take 10 mLs (1 g total) by mouth 4 (four) times daily -  with meals and at bedtime. 420 mL 0  . traMADol (ULTRAM) 50 MG tablet Take 1 tablet (50 mg total) by mouth every 12 (twelve) hours as needed. 30 tablet 0  . acetaminophen-codeine (TYLENOL #4) 300-60 MG tablet Take 1 tablet by mouth every 4 (four) hours as needed.  0   No current facility-administered medications for this encounter.    Facility-Administered Medications Ordered in Other Encounters  Medication Dose Route Frequency Provider Last Rate Last Dose  . CARBOplatin (PARAPLATIN) 710 mg in sodium chloride 0.9 % 250 mL chemo infusion  710 mg Intravenous Once Curt Bears, MD 642 mL/hr at 03/09/17 1128 710 mg at 03/09/17 1128    Physical Findings:  height is 6\' 1"  (1.854 m) and weight is 225 lb 6.4 oz (102.2 kg). His oral temperature is 98.2 F (36.8 C). His blood pressure is 148/99 (abnormal) and his pulse is 105 (abnormal). His respiration is 18.  Pain Assessment Pain Score: 0-No pain/10 In general this is a well appearing Caucasian male in no acute  distress. He's alert and oriented x4 and appropriate throughout the examination. Cardiopulmonary assessment is negative for acute distress and he exhibits normal effort.   Lab Findings: Lab Results  Component Value Date   WBC 3.6 (L) 03/09/2017   HGB 14.2 03/09/2017   HCT 40.7 03/09/2017   MCV 93.8 03/09/2017   PLT 368 03/09/2017     Radiographic Findings: No results found.  Impression/Plan: 1. 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 The patient continues on Carboplatin and Alimta chemotherapy with Dr. Julien Nordmann. He is  counseled on the role of post treatment surveillance of the brain and will be due for his first MRI in about 2 months. He states agreement and understanding, and we will cancel his 1 month follow up with Dr. Sondra Come since he's been seen today.     Carola Rhine, PAC

## 2017-03-09 NOTE — Addendum Note (Signed)
Encounter addended by: Malena Edman, RN on: 03/09/2017 12:23 PM<BR>    Actions taken: Charge Capture section accepted

## 2017-03-09 NOTE — Patient Instructions (Signed)
[facility] Discharge Instructions for Patients Receiving Chemotherapy  Today you received the following chemotherapy agents Alimta, Avastin, Carboplatin  To help prevent nausea and vomiting after your treatment, we encourage you to take your nausea medication    If you develop nausea and vomiting that is not controlled by your nausea medication, call the clinic.   BELOW ARE SYMPTOMS THAT SHOULD BE REPORTED IMMEDIATELY:  *FEVER GREATER THAN 100.5 F  *CHILLS WITH OR WITHOUT FEVER  NAUSEA AND VOMITING THAT IS NOT CONTROLLED WITH YOUR NAUSEA MEDICATION  *UNUSUAL SHORTNESS OF BREATH  *UNUSUAL BRUISING OR BLEEDING  TENDERNESS IN MOUTH AND THROAT WITH OR WITHOUT PRESENCE OF ULCERS  *URINARY PROBLEMS  *BOWEL PROBLEMS  UNUSUAL RASH Items with * indicate a potential emergency and should be followed up as soon as possible.  Feel free to call the clinic you have any questions or concerns. The clinic phone number is [phone].  Please show the [facility] at check-in to the Emergency Department and triage nurse.

## 2017-03-10 ENCOUNTER — Telehealth: Payer: Self-pay | Admitting: Internal Medicine

## 2017-03-10 NOTE — Telephone Encounter (Signed)
Scheduled additional appts per 5/25 los - Patient to get new schedule next visit.

## 2017-03-12 NOTE — Addendum Note (Signed)
Addendum  created 03/12/17 1020 by Rica Koyanagi, MD   Sign clinical note

## 2017-03-15 ENCOUNTER — Ambulatory Visit: Payer: Self-pay | Admitting: Radiation Oncology

## 2017-03-16 ENCOUNTER — Other Ambulatory Visit (HOSPITAL_BASED_OUTPATIENT_CLINIC_OR_DEPARTMENT_OTHER): Payer: 59

## 2017-03-16 DIAGNOSIS — C3492 Malignant neoplasm of unspecified part of left bronchus or lung: Secondary | ICD-10-CM

## 2017-03-16 DIAGNOSIS — C7A1 Malignant poorly differentiated neuroendocrine tumors: Secondary | ICD-10-CM

## 2017-03-16 LAB — COMPREHENSIVE METABOLIC PANEL
ALT: 87 U/L — ABNORMAL HIGH (ref 0–55)
AST: 33 U/L (ref 5–34)
Albumin: 4.1 g/dL (ref 3.5–5.0)
Alkaline Phosphatase: 73 U/L (ref 40–150)
Anion Gap: 10 mEq/L (ref 3–11)
BUN: 15.2 mg/dL (ref 7.0–26.0)
CO2: 27 mEq/L (ref 22–29)
Calcium: 10.1 mg/dL (ref 8.4–10.4)
Chloride: 101 mEq/L (ref 98–109)
Creatinine: 1 mg/dL (ref 0.7–1.3)
EGFR: 83 mL/min/{1.73_m2} — ABNORMAL LOW (ref >90)
Glucose: 74 mg/dl (ref 70–140)
Potassium: 4 mEq/L (ref 3.5–5.1)
Sodium: 138 mEq/L (ref 136–145)
Total Bilirubin: 0.4 mg/dL (ref 0.20–1.20)
Total Protein: 7.4 g/dL (ref 6.4–8.3)

## 2017-03-16 LAB — CBC WITH DIFFERENTIAL/PLATELET
BASO%: 0.7 % (ref 0.0–2.0)
Basophils Absolute: 0 10*3/uL (ref 0.0–0.1)
EOS%: 0.8 % (ref 0.0–7.0)
Eosinophils Absolute: 0 10*3/uL (ref 0.0–0.5)
HCT: 37.8 % — ABNORMAL LOW (ref 38.4–49.9)
HGB: 13 g/dL (ref 13.0–17.1)
LYMPH%: 29.6 % (ref 14.0–49.0)
MCH: 32.5 pg (ref 27.2–33.4)
MCHC: 34.4 g/dL (ref 32.0–36.0)
MCV: 94.4 fL (ref 79.3–98.0)
MONO#: 0.4 10*3/uL (ref 0.1–0.9)
MONO%: 23.9 % — ABNORMAL HIGH (ref 0.0–14.0)
NEUT#: 0.7 10*3/uL — ABNORMAL LOW (ref 1.5–6.5)
NEUT%: 45 % (ref 39.0–75.0)
Platelets: 272 10*3/uL (ref 140–400)
RBC: 4.01 10*6/uL — ABNORMAL LOW (ref 4.20–5.82)
RDW: 18.2 % — ABNORMAL HIGH (ref 11.0–14.6)
WBC: 1.5 10*3/uL — ABNORMAL LOW (ref 4.0–10.3)
lymph#: 0.5 10*3/uL — ABNORMAL LOW (ref 0.9–3.3)

## 2017-03-17 DIAGNOSIS — Z09 Encounter for follow-up examination after completed treatment for conditions other than malignant neoplasm: Secondary | ICD-10-CM | POA: Diagnosis not present

## 2017-03-23 ENCOUNTER — Other Ambulatory Visit (HOSPITAL_BASED_OUTPATIENT_CLINIC_OR_DEPARTMENT_OTHER): Payer: 59

## 2017-03-23 ENCOUNTER — Other Ambulatory Visit: Payer: Self-pay | Admitting: *Deleted

## 2017-03-23 DIAGNOSIS — C7949 Secondary malignant neoplasm of other parts of nervous system: Principal | ICD-10-CM

## 2017-03-23 DIAGNOSIS — C7A1 Malignant poorly differentiated neuroendocrine tumors: Secondary | ICD-10-CM

## 2017-03-23 DIAGNOSIS — C3492 Malignant neoplasm of unspecified part of left bronchus or lung: Secondary | ICD-10-CM

## 2017-03-23 DIAGNOSIS — C7931 Secondary malignant neoplasm of brain: Secondary | ICD-10-CM

## 2017-03-23 LAB — CBC WITH DIFFERENTIAL/PLATELET
BASO%: 0.3 % (ref 0.0–2.0)
Basophils Absolute: 0 10*3/uL (ref 0.0–0.1)
EOS%: 1 % (ref 0.0–7.0)
Eosinophils Absolute: 0 10*3/uL (ref 0.0–0.5)
HCT: 37.7 % — ABNORMAL LOW (ref 38.4–49.9)
HGB: 13 g/dL (ref 13.0–17.1)
LYMPH%: 26.5 % (ref 14.0–49.0)
MCH: 32.9 pg (ref 27.2–33.4)
MCHC: 34.5 g/dL (ref 32.0–36.0)
MCV: 95.4 fL (ref 79.3–98.0)
MONO#: 0.4 10*3/uL (ref 0.1–0.9)
MONO%: 12.2 % (ref 0.0–14.0)
NEUT#: 1.7 10*3/uL (ref 1.5–6.5)
NEUT%: 60 % (ref 39.0–75.0)
Platelets: 140 10*3/uL (ref 140–400)
RBC: 3.95 10*6/uL — ABNORMAL LOW (ref 4.20–5.82)
RDW: 18.6 % — ABNORMAL HIGH (ref 11.0–14.6)
WBC: 2.9 10*3/uL — ABNORMAL LOW (ref 4.0–10.3)
lymph#: 0.8 10*3/uL — ABNORMAL LOW (ref 0.9–3.3)
nRBC: 0 % (ref 0–0)

## 2017-03-23 LAB — COMPREHENSIVE METABOLIC PANEL
ALT: 37 U/L (ref 0–55)
AST: 18 U/L (ref 5–34)
Albumin: 3.8 g/dL (ref 3.5–5.0)
Alkaline Phosphatase: 84 U/L (ref 40–150)
Anion Gap: 7 mEq/L (ref 3–11)
BUN: 11.1 mg/dL (ref 7.0–26.0)
CO2: 30 mEq/L — ABNORMAL HIGH (ref 22–29)
Calcium: 10.2 mg/dL (ref 8.4–10.4)
Chloride: 104 mEq/L (ref 98–109)
Creatinine: 1.2 mg/dL (ref 0.7–1.3)
EGFR: 71 mL/min/{1.73_m2} — ABNORMAL LOW (ref >90)
Glucose: 87 mg/dl (ref 70–140)
Potassium: 4.7 mEq/L (ref 3.5–5.1)
Sodium: 141 mEq/L (ref 136–145)
Total Bilirubin: 0.31 mg/dL (ref 0.20–1.20)
Total Protein: 7.2 g/dL (ref 6.4–8.3)

## 2017-03-29 ENCOUNTER — Ambulatory Visit (HOSPITAL_BASED_OUTPATIENT_CLINIC_OR_DEPARTMENT_OTHER): Payer: 59 | Admitting: Internal Medicine

## 2017-03-29 ENCOUNTER — Other Ambulatory Visit (HOSPITAL_BASED_OUTPATIENT_CLINIC_OR_DEPARTMENT_OTHER): Payer: 59

## 2017-03-29 ENCOUNTER — Telehealth: Payer: Self-pay | Admitting: Internal Medicine

## 2017-03-29 ENCOUNTER — Ambulatory Visit (HOSPITAL_BASED_OUTPATIENT_CLINIC_OR_DEPARTMENT_OTHER): Payer: 59

## 2017-03-29 ENCOUNTER — Encounter: Payer: Self-pay | Admitting: Internal Medicine

## 2017-03-29 VITALS — BP 150/100 | HR 96 | Temp 98.3°F | Resp 18 | Ht 73.0 in | Wt 226.5 lb

## 2017-03-29 VITALS — BP 151/96 | HR 90

## 2017-03-29 DIAGNOSIS — C3492 Malignant neoplasm of unspecified part of left bronchus or lung: Secondary | ICD-10-CM

## 2017-03-29 DIAGNOSIS — C7A1 Malignant poorly differentiated neuroendocrine tumors: Secondary | ICD-10-CM | POA: Diagnosis not present

## 2017-03-29 DIAGNOSIS — Z5111 Encounter for antineoplastic chemotherapy: Secondary | ICD-10-CM

## 2017-03-29 DIAGNOSIS — Z5112 Encounter for antineoplastic immunotherapy: Secondary | ICD-10-CM

## 2017-03-29 DIAGNOSIS — C7B8 Other secondary neuroendocrine tumors: Secondary | ICD-10-CM

## 2017-03-29 DIAGNOSIS — C7931 Secondary malignant neoplasm of brain: Secondary | ICD-10-CM

## 2017-03-29 LAB — CBC WITH DIFFERENTIAL/PLATELET
BASO%: 0.2 % (ref 0.0–2.0)
Basophils Absolute: 0 10*3/uL (ref 0.0–0.1)
EOS%: 2 % (ref 0.0–7.0)
Eosinophils Absolute: 0.1 10*3/uL (ref 0.0–0.5)
HCT: 37.5 % — ABNORMAL LOW (ref 38.4–49.9)
HGB: 12.9 g/dL — ABNORMAL LOW (ref 13.0–17.1)
LYMPH%: 19 % (ref 14.0–49.0)
MCH: 33.6 pg — ABNORMAL HIGH (ref 27.2–33.4)
MCHC: 34.3 g/dL (ref 32.0–36.0)
MCV: 98 fL (ref 79.3–98.0)
MONO#: 0.4 10*3/uL (ref 0.1–0.9)
MONO%: 16.9 % — ABNORMAL HIGH (ref 0.0–14.0)
NEUT#: 1.6 10*3/uL (ref 1.5–6.5)
NEUT%: 61.9 % (ref 39.0–75.0)
Platelets: 186 10*3/uL (ref 140–400)
RBC: 3.83 10*6/uL — ABNORMAL LOW (ref 4.20–5.82)
RDW: 23.4 % — ABNORMAL HIGH (ref 11.0–14.6)
WBC: 2.6 10*3/uL — ABNORMAL LOW (ref 4.0–10.3)
lymph#: 0.5 10*3/uL — ABNORMAL LOW (ref 0.9–3.3)

## 2017-03-29 LAB — COMPREHENSIVE METABOLIC PANEL
ALT: 34 U/L (ref 0–55)
AST: 18 U/L (ref 5–34)
Albumin: 3.7 g/dL (ref 3.5–5.0)
Alkaline Phosphatase: 82 U/L (ref 40–150)
Anion Gap: 9 mEq/L (ref 3–11)
BUN: 9.8 mg/dL (ref 7.0–26.0)
CO2: 25 mEq/L (ref 22–29)
Calcium: 9.3 mg/dL (ref 8.4–10.4)
Chloride: 105 mEq/L (ref 98–109)
Creatinine: 1.1 mg/dL (ref 0.7–1.3)
EGFR: 75 mL/min/{1.73_m2} — ABNORMAL LOW (ref >90)
Glucose: 136 mg/dl (ref 70–140)
Potassium: 3.9 mEq/L (ref 3.5–5.1)
Sodium: 139 mEq/L (ref 136–145)
Total Bilirubin: 0.46 mg/dL (ref 0.20–1.20)
Total Protein: 7 g/dL (ref 6.4–8.3)

## 2017-03-29 LAB — UA PROTEIN, DIPSTICK - CHCC: Protein, ur: NEGATIVE mg/dL

## 2017-03-29 MED ORDER — CYANOCOBALAMIN 1000 MCG/ML IJ SOLN
INTRAMUSCULAR | Status: AC
  Filled 2017-03-29: qty 1

## 2017-03-29 MED ORDER — PALONOSETRON HCL INJECTION 0.25 MG/5ML
INTRAVENOUS | Status: AC
  Filled 2017-03-29: qty 5

## 2017-03-29 MED ORDER — CYANOCOBALAMIN 1000 MCG/ML IJ SOLN
1000.0000 ug | Freq: Once | INTRAMUSCULAR | Status: AC
  Administered 2017-03-29: 1000 ug via INTRAMUSCULAR

## 2017-03-29 MED ORDER — SODIUM CHLORIDE 0.9 % IV SOLN
Freq: Once | INTRAVENOUS | Status: AC
  Administered 2017-03-29: 11:00:00 via INTRAVENOUS

## 2017-03-29 MED ORDER — SODIUM CHLORIDE 0.9 % IV SOLN
519.0000 mg/m2 | Freq: Once | INTRAVENOUS | Status: AC
  Administered 2017-03-29: 1200 mg via INTRAVENOUS
  Filled 2017-03-29: qty 40

## 2017-03-29 MED ORDER — CARBOPLATIN CHEMO INJECTION 600 MG/60ML
710.0000 mg | Freq: Once | INTRAVENOUS | Status: AC
  Administered 2017-03-29: 710 mg via INTRAVENOUS
  Filled 2017-03-29: qty 71

## 2017-03-29 MED ORDER — PALONOSETRON HCL INJECTION 0.25 MG/5ML
0.2500 mg | Freq: Once | INTRAVENOUS | Status: AC
  Administered 2017-03-29: 0.25 mg via INTRAVENOUS

## 2017-03-29 MED ORDER — SODIUM CHLORIDE 0.9 % IV SOLN
Freq: Once | INTRAVENOUS | Status: AC
  Administered 2017-03-29: 11:00:00 via INTRAVENOUS
  Filled 2017-03-29: qty 5

## 2017-03-29 MED ORDER — SODIUM CHLORIDE 0.9 % IV SOLN
15.6000 mg/kg | Freq: Once | INTRAVENOUS | Status: AC
  Administered 2017-03-29: 1600 mg via INTRAVENOUS
  Filled 2017-03-29: qty 64

## 2017-03-29 NOTE — Telephone Encounter (Signed)
Scheduled appt per 6/18 los - left message that additional appts were added and patient to pick up new schedule next visit.[facility] to contact patient with Ct schedule.

## 2017-03-29 NOTE — Progress Notes (Signed)
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  PRIOR THERAPY:  1) Stereotactic radiotherapy to a solitary brain metastasis under the care of Dr. Lisbeth Wagner on 01/25/2017. 2) Short course of concurrent chemoradiation with weekly carboplatin for AUC of 2 and paclitaxel 45 MG/M2 to the locally advanced disease in the chest. Status post 3 cycles.  CURRENT THERAPY: Systemic chemotherapy with carboplatin for AUC of 5, Alimta 500 MG/M2 and Avastin 15 MG/KG every 3 weeks. First dose of 02/15/2017. Status post 2 cycles.   INTERVAL HISTORY: Calvin Wagner 51 y.o. male returns to the clinic today for follow-up visit. The patient has been tolerating his systemic chemotherapy with carboplatin, Alimta and Avastin fairly well. He denied having any chest pain, shortness of breath, cough or hemoptysis. He has intermittent dry eye. He denied having any fever or chills. He has no nausea, vomiting, diarrhea or constipation. He is here today for evaluation before starting cycle #3.   MEDICAL HISTORY: Past Medical History:  Diagnosis Date  . Encounter for antineoplastic chemotherapy 12/31/2016  . GERD 11/08/2009   Qualifier: Diagnosis of  By: Calvin Ramp MD, Calvin Wagner Goals of care, counseling/discussion 12/31/2016  . HYPERTENSION 11/08/2009   Qualifier: Diagnosis of  By: Calvin Ramp MD, Calvin Wagner.   . Large cell carcinoma of left lung, stage 4 ([place]) 12/31/2016  . Migraine 02/25/2012  . Pneumonia   . PONV (postoperative nausea and vomiting)   . Rotator cuff tear, left     ALLERGIES:  is  allergic to no known allergies.  MEDICATIONS:  Current Outpatient Prescriptions  Medication Sig Dispense Refill  . acetaminophen-codeine (TYLENOL #4) 300-60 MG tablet Take 1 tablet by mouth every 4 (four) hours as needed.  0  . budesonide-formoterol (SYMBICORT) 80-4.5 MCG/ACT inhaler Inhale 2 puffs into the lungs 2 (two) times daily. 1 Inhaler 5  . chlorpheniramine-HYDROcodone (TUSSIONEX) 10-8 MG/5ML SUER Take 5 mLs by mouth every 12 (twelve) hours as needed for cough. 473 mL 0  . dexamethasone (DECADRON) 4 MG tablet 4 mg by mouth twice a day the day before, day of and day after the chemotherapy 40 tablet 1  . enoxaparin (LOVENOX) 150 MG/ML injection Inject 1.02 mLs (155 mg total) into the skin daily. 30 mL 2  . folic acid (FOLVITE) 1 MG tablet Take 1 tablet (1 mg total) by mouth daily. 30 tablet 4  . LORazepam (ATIVAN) 0.5 MG tablet 1 tablet po q 4-6 hours prn anxiety and one po 30 minutes prior to radiation or MRI 30 tablet 0  . losartan (COZAAR) 25 MG tablet Take 25 mg by mouth daily.  1  . Multiple Vitamin (MULTIVITAMIN WITH MINERALS) TABS tablet Take 1 tablet by mouth daily.    . pantoprazole (PROTONIX) 40 MG tablet Take 40 mg by mouth daily.  2  . sucralfate (CARAFATE) 1 GM/10ML suspension Take 10 mLs (1 g total) by mouth 4 (four) times daily -  with meals and at bedtime. 420 mL 0  . traMADol (ULTRAM) 50 MG tablet Take 1 tablet (50 mg total) by mouth every 12 (twelve) hours as needed. Calvin Wagner  tablet 0  . prochlorperazine (COMPAZINE) 10 MG tablet Take 1 tablet (10 mg total) by mouth every 6 (six) hours as needed for nausea or vomiting. (Patient not taking: Reported on 03/29/2017) 30 tablet 0   No current facility-administered medications for this visit.     SURGICAL HISTORY:  Past Surgical History:  Procedure Laterality Date  . INGUINAL HERNIA REPAIR    . SHOULDER ARTHROSCOPY WITH ROTATOR CUFF REPAIR Left 11/12/2016   Procedure: SHOULDER ARTHROSCOPY WITH ROTATOR CUFF REPAIR AND SUBACROMIAL  DECOMPRESSION;  Surgeon: Tania Ade, MD;  Location: [facility];  Service: Orthopedics;  Laterality: Left;  SHOULDER ARTHROSCOPY WITH ROTATOR CUFF REPAIR AND SUBACROMIAL DECOMPRESSION  . TONSILLECTOMY    . TOTAL KNEE ARTHROPLASTY    . VIDEO BRONCHOSCOPY Bilateral 12/10/2016   Procedure: VIDEO BRONCHOSCOPY WITHOUT FLUORO;  Surgeon: Tanda Rockers, MD;  Location: [facility];  Service: Cardiopulmonary;  Laterality: Bilateral;    REVIEW OF SYSTEMS:  A comprehensive review of systems was negative.   PHYSICAL EXAMINATION: General appearance: alert, cooperative and no distress Head: Normocephalic, without obvious abnormality, atraumatic Neck: no adenopathy, no JVD, supple, symmetrical, trachea midline and thyroid not enlarged, symmetric, no tenderness/mass/nodules Lymph nodes: Cervical, supraclavicular, and axillary nodes normal. Resp: clear to auscultation bilaterally Back: symmetric, no curvature. ROM normal. No CVA tenderness. Cardio: regular rate and rhythm, S1, S2 normal, no murmur, click, rub or gallop GI: soft, non-tender; bowel sounds normal; no masses,  no organomegaly Extremities: extremities normal, atraumatic, no cyanosis or edema  ECOG PERFORMANCE STATUS: 0 - Asymptomatic  Blood pressure (!) 150/100, pulse 96, temperature 98.3 F (36.8 C), temperature source Oral, resp. rate 18, height 6\' 1"  (1.854 m), weight 226 lb 8 oz (102.7 kg), SpO2 100 %.  LABORATORY DATA: Lab Results  Component Value Date   WBC 2.6 (L) 03/29/2017   HGB 12.9 (L) 03/29/2017   HCT 37.5 (L) 03/29/2017   MCV 98.0 03/29/2017   PLT 186 03/29/2017      Chemistry      Component Value Date/Time   NA 141 03/23/2017 1232   K 4.7 03/23/2017 1232   CL 103 11/26/2016 0438   CO2 30 (H) 03/23/2017 1232   BUN 11.1 03/23/2017 1232   CREATININE 1.2 03/23/2017 1232      Component Value Date/Time   CALCIUM 10.2 03/23/2017 1232   ALKPHOS 84 03/23/2017 1232   AST 18 03/23/2017 1232   ALT 37 03/23/2017 1232    BILITOT 0.31 03/23/2017 1232       RADIOGRAPHIC STUDIES: No results found.  ASSESSMENT AND PLAN: This is a very pleasant 51 years old white male with a stage IV non-small cell lung cancer, high-grade neuroendocrine carcinoma presented with local advanced disease in the chest as well as solitary brain metastasis and 2 liver lesions. He is status post stereotactic radiotherapy to the solitary brain metastases in addition to a short course of concurrent chemoradiation to the locally advanced disease in the chest. The patient is currently undergoing systemic chemotherapy with carboplatin, Alimta and Avastin status post 2 cycles and has been tolerating this treatment well. I recommended for the patient to proceed with cycle #3 today as a scheduled. I will see him back for follow-up visit in 3 weeks for reevaluation after repeating CT scan of the chest, abdomen and pelvis for restaging of his disease. He was advised to call immediately if he has any concerning symptoms in the interval. The patient voices understanding of current disease status and treatment options and is in  agreement with the current care plan. All questions were answered. The patient knows to call the clinic with any problems, questions or concerns. We can certainly see the patient much sooner if necessary. I spent 10 minutes counseling the patient face to face. The total time spent in the appointment was 15 minutes.  Disclaimer: This note was dictated with voice recognition software. Similar sounding words can inadvertently be transcribed and may not be corrected upon review.

## 2017-03-29 NOTE — Progress Notes (Signed)
PEr Dr Nicanor Alcon it is okay to treat pt today with chemotherapy and wbc of 2.6

## 2017-03-29 NOTE — Patient Instructions (Signed)
[facility] Discharge Instructions for Patients Receiving Chemotherapy  Today you received the following chemotherapy agents Avastin/Alimta/Carboplatin To help prevent nausea and vomiting after your treatment, we encourage you to take your nausea medication as prescribed.   If you develop nausea and vomiting that is not controlled by your nausea medication, call the clinic.   BELOW ARE SYMPTOMS THAT SHOULD BE REPORTED IMMEDIATELY:  *FEVER GREATER THAN 100.5 F  *CHILLS WITH OR WITHOUT FEVER  NAUSEA AND VOMITING THAT IS NOT CONTROLLED WITH YOUR NAUSEA MEDICATION  *UNUSUAL SHORTNESS OF BREATH  *UNUSUAL BRUISING OR BLEEDING  TENDERNESS IN MOUTH AND THROAT WITH OR WITHOUT PRESENCE OF ULCERS  *URINARY PROBLEMS  *BOWEL PROBLEMS  UNUSUAL RASH Items with * indicate a potential emergency and should be followed up as soon as possible.  Feel free to call the clinic you have any questions or concerns. The clinic phone number is [phone].  Please show the [facility] at check-in to the Emergency Department and triage nurse.

## 2017-04-06 ENCOUNTER — Other Ambulatory Visit: Payer: Self-pay | Admitting: Medical Oncology

## 2017-04-06 ENCOUNTER — Other Ambulatory Visit (HOSPITAL_BASED_OUTPATIENT_CLINIC_OR_DEPARTMENT_OTHER): Payer: 59

## 2017-04-06 DIAGNOSIS — C7A1 Malignant poorly differentiated neuroendocrine tumors: Secondary | ICD-10-CM | POA: Diagnosis not present

## 2017-04-06 DIAGNOSIS — C3492 Malignant neoplasm of unspecified part of left bronchus or lung: Secondary | ICD-10-CM

## 2017-04-06 DIAGNOSIS — I2699 Other pulmonary embolism without acute cor pulmonale: Secondary | ICD-10-CM

## 2017-04-06 LAB — CBC WITH DIFFERENTIAL/PLATELET
BASO%: 0.5 % (ref 0.0–2.0)
Basophils Absolute: 0 10*3/uL (ref 0.0–0.1)
EOS%: 2.5 % (ref 0.0–7.0)
Eosinophils Absolute: 0.1 10*3/uL (ref 0.0–0.5)
HCT: 33.1 % — ABNORMAL LOW (ref 38.4–49.9)
HGB: 11.6 g/dL — ABNORMAL LOW (ref 13.0–17.1)
LYMPH%: 22.5 % (ref 14.0–49.0)
MCH: 34.3 pg — ABNORMAL HIGH (ref 27.2–33.4)
MCHC: 34.9 g/dL (ref 32.0–36.0)
MCV: 98.4 fL — ABNORMAL HIGH (ref 79.3–98.0)
MONO#: 0.4 10*3/uL (ref 0.1–0.9)
MONO%: 19.3 % — ABNORMAL HIGH (ref 0.0–14.0)
NEUT#: 1.1 10*3/uL — ABNORMAL LOW (ref 1.5–6.5)
NEUT%: 55.2 % (ref 39.0–75.0)
Platelets: 133 10*3/uL — ABNORMAL LOW (ref 140–400)
RBC: 3.37 10*6/uL — ABNORMAL LOW (ref 4.20–5.82)
RDW: 23 % — ABNORMAL HIGH (ref 11.0–14.6)
WBC: 2 10*3/uL — ABNORMAL LOW (ref 4.0–10.3)
lymph#: 0.5 10*3/uL — ABNORMAL LOW (ref 0.9–3.3)

## 2017-04-06 LAB — COMPREHENSIVE METABOLIC PANEL
ALT: 48 U/L (ref 0–55)
AST: 22 U/L (ref 5–34)
Albumin: 3.8 g/dL (ref 3.5–5.0)
Alkaline Phosphatase: 70 U/L (ref 40–150)
Anion Gap: 8 mEq/L (ref 3–11)
BUN: 14.3 mg/dL (ref 7.0–26.0)
CO2: 26 mEq/L (ref 22–29)
Calcium: 9.4 mg/dL (ref 8.4–10.4)
Chloride: 103 mEq/L (ref 98–109)
Creatinine: 1.1 mg/dL (ref 0.7–1.3)
EGFR: 78 mL/min/{1.73_m2} — ABNORMAL LOW (ref >90)
Glucose: 156 mg/dl — ABNORMAL HIGH (ref 70–140)
Potassium: 4.2 mEq/L (ref 3.5–5.1)
Sodium: 137 mEq/L (ref 136–145)
Total Bilirubin: 0.4 mg/dL (ref 0.20–1.20)
Total Protein: 7 g/dL (ref 6.4–8.3)

## 2017-04-06 MED ORDER — ENOXAPARIN SODIUM 150 MG/ML ~~LOC~~ SOLN: 1.5000 mg/kg | SUBCUTANEOUS | 2 refills | Status: DC

## 2017-04-06 MED ORDER — ENOXAPARIN SODIUM 150 MG/ML ~~LOC~~ SOLN: 150.0000 mg | SUBCUTANEOUS | 2 refills | Status: DC

## 2017-04-13 ENCOUNTER — Other Ambulatory Visit (HOSPITAL_BASED_OUTPATIENT_CLINIC_OR_DEPARTMENT_OTHER): Payer: 59

## 2017-04-13 DIAGNOSIS — C7A1 Malignant poorly differentiated neuroendocrine tumors: Secondary | ICD-10-CM | POA: Diagnosis not present

## 2017-04-13 DIAGNOSIS — C3492 Malignant neoplasm of unspecified part of left bronchus or lung: Secondary | ICD-10-CM

## 2017-04-13 LAB — CBC WITH DIFFERENTIAL/PLATELET
BASO%: 0.1 % (ref 0.0–2.0)
Basophils Absolute: 0 10*3/uL (ref 0.0–0.1)
EOS%: 1.5 % (ref 0.0–7.0)
Eosinophils Absolute: 0 10*3/uL (ref 0.0–0.5)
HCT: 35.3 % — ABNORMAL LOW (ref 38.4–49.9)
HGB: 12 g/dL — ABNORMAL LOW (ref 13.0–17.1)
LYMPH%: 15.5 % (ref 14.0–49.0)
MCH: 34.7 pg — ABNORMAL HIGH (ref 27.2–33.4)
MCHC: 34.1 g/dL (ref 32.0–36.0)
MCV: 101.5 fL — ABNORMAL HIGH (ref 79.3–98.0)
MONO#: 0.4 10*3/uL (ref 0.1–0.9)
MONO%: 15 % — ABNORMAL HIGH (ref 0.0–14.0)
NEUT#: 1.9 10*3/uL (ref 1.5–6.5)
NEUT%: 67.9 % (ref 39.0–75.0)
Platelets: 113 10*3/uL — ABNORMAL LOW (ref 140–400)
RBC: 3.48 10*6/uL — ABNORMAL LOW (ref 4.20–5.82)
RDW: 24.5 % — ABNORMAL HIGH (ref 11.0–14.6)
WBC: 2.8 10*3/uL — ABNORMAL LOW (ref 4.0–10.3)
lymph#: 0.4 10*3/uL — ABNORMAL LOW (ref 0.9–3.3)

## 2017-04-13 LAB — COMPREHENSIVE METABOLIC PANEL
ALT: 24 U/L (ref 0–55)
AST: 16 U/L (ref 5–34)
Albumin: 3.8 g/dL (ref 3.5–5.0)
Alkaline Phosphatase: 83 U/L (ref 40–150)
Anion Gap: 11 mEq/L (ref 3–11)
BUN: 11.4 mg/dL (ref 7.0–26.0)
CO2: 28 mEq/L (ref 22–29)
Calcium: 9.8 mg/dL (ref 8.4–10.4)
Chloride: 102 mEq/L (ref 98–109)
Creatinine: 1.2 mg/dL (ref 0.7–1.3)
EGFR: 73 mL/min/{1.73_m2} — ABNORMAL LOW (ref >90)
Glucose: 170 mg/dl — ABNORMAL HIGH (ref 70–140)
Potassium: 4.7 mEq/L (ref 3.5–5.1)
Sodium: 141 mEq/L (ref 136–145)
Total Bilirubin: 0.42 mg/dL (ref 0.20–1.20)
Total Protein: 7.2 g/dL (ref 6.4–8.3)

## 2017-04-18 ENCOUNTER — Other Ambulatory Visit: Payer: Self-pay | Admitting: Nurse Practitioner

## 2017-04-18 DIAGNOSIS — C3492 Malignant neoplasm of unspecified part of left bronchus or lung: Secondary | ICD-10-CM

## 2017-04-19 ENCOUNTER — Encounter: Payer: Self-pay | Admitting: Medical Oncology

## 2017-04-19 ENCOUNTER — Ambulatory Visit (HOSPITAL_BASED_OUTPATIENT_CLINIC_OR_DEPARTMENT_OTHER): Payer: 59

## 2017-04-19 ENCOUNTER — Other Ambulatory Visit (HOSPITAL_BASED_OUTPATIENT_CLINIC_OR_DEPARTMENT_OTHER): Payer: 59

## 2017-04-19 ENCOUNTER — Encounter: Payer: Self-pay | Admitting: Internal Medicine

## 2017-04-19 ENCOUNTER — Telehealth: Payer: Self-pay | Admitting: Internal Medicine

## 2017-04-19 ENCOUNTER — Ambulatory Visit (HOSPITAL_BASED_OUTPATIENT_CLINIC_OR_DEPARTMENT_OTHER): Payer: 59 | Admitting: Internal Medicine

## 2017-04-19 VITALS — BP 154/107 | HR 112 | Temp 98.6°F | Resp 20

## 2017-04-19 DIAGNOSIS — Z5112 Encounter for antineoplastic immunotherapy: Secondary | ICD-10-CM | POA: Diagnosis not present

## 2017-04-19 DIAGNOSIS — C7A1 Malignant poorly differentiated neuroendocrine tumors: Secondary | ICD-10-CM

## 2017-04-19 DIAGNOSIS — I10 Essential (primary) hypertension: Secondary | ICD-10-CM

## 2017-04-19 DIAGNOSIS — C3492 Malignant neoplasm of unspecified part of left bronchus or lung: Secondary | ICD-10-CM

## 2017-04-19 DIAGNOSIS — C7B8 Other secondary neuroendocrine tumors: Secondary | ICD-10-CM | POA: Diagnosis not present

## 2017-04-19 DIAGNOSIS — Z5111 Encounter for antineoplastic chemotherapy: Secondary | ICD-10-CM | POA: Diagnosis not present

## 2017-04-19 LAB — CBC WITH DIFFERENTIAL/PLATELET
BASO%: 0.2 % (ref 0.0–2.0)
Basophils Absolute: 0 10*3/uL (ref 0.0–0.1)
EOS%: 0 % (ref 0.0–7.0)
Eosinophils Absolute: 0 10*3/uL (ref 0.0–0.5)
HCT: 36.2 % — ABNORMAL LOW (ref 38.4–49.9)
HGB: 12.5 g/dL — ABNORMAL LOW (ref 13.0–17.1)
LYMPH%: 8 % — ABNORMAL LOW (ref 14.0–49.0)
MCH: 36.1 pg — ABNORMAL HIGH (ref 27.2–33.4)
MCHC: 34.6 g/dL (ref 32.0–36.0)
MCV: 104.4 fL — ABNORMAL HIGH (ref 79.3–98.0)
MONO#: 0.2 10*3/uL (ref 0.1–0.9)
MONO%: 5 % (ref 0.0–14.0)
NEUT#: 3.3 10*3/uL (ref 1.5–6.5)
NEUT%: 86.8 % — ABNORMAL HIGH (ref 39.0–75.0)
Platelets: 167 10*3/uL (ref 140–400)
RBC: 3.47 10*6/uL — ABNORMAL LOW (ref 4.20–5.82)
RDW: 25.8 % — ABNORMAL HIGH (ref 11.0–14.6)
WBC: 3.8 10*3/uL — ABNORMAL LOW (ref 4.0–10.3)
lymph#: 0.3 10*3/uL — ABNORMAL LOW (ref 0.9–3.3)

## 2017-04-19 LAB — UA PROTEIN, DIPSTICK - CHCC: Protein, ur: NEGATIVE mg/dL

## 2017-04-19 LAB — COMPREHENSIVE METABOLIC PANEL
ALT: 28 U/L (ref 0–55)
AST: 16 U/L (ref 5–34)
Albumin: 4.1 g/dL (ref 3.5–5.0)
Alkaline Phosphatase: 84 U/L (ref 40–150)
Anion Gap: 12 mEq/L — ABNORMAL HIGH (ref 3–11)
BUN: 8.7 mg/dL (ref 7.0–26.0)
CO2: 23 mEq/L (ref 22–29)
Calcium: 10.2 mg/dL (ref 8.4–10.4)
Chloride: 104 mEq/L (ref 98–109)
Creatinine: 1.1 mg/dL (ref 0.7–1.3)
EGFR: 80 mL/min/{1.73_m2} — ABNORMAL LOW (ref >90)
Glucose: 128 mg/dl (ref 70–140)
Potassium: 4.2 mEq/L (ref 3.5–5.1)
Sodium: 139 mEq/L (ref 136–145)
Total Bilirubin: 0.62 mg/dL (ref 0.20–1.20)
Total Protein: 7.5 g/dL (ref 6.4–8.3)

## 2017-04-19 MED ORDER — TRAMADOL HCL 50 MG PO TABS: 50.0000 mg | ORAL_TABLET | Freq: Two times a day (BID) | ORAL | 0 refills | Status: DC | PRN

## 2017-04-19 MED ORDER — SODIUM CHLORIDE 0.9 % IV SOLN
Freq: Once | INTRAVENOUS | Status: AC
  Administered 2017-04-19: 12:00:00 via INTRAVENOUS

## 2017-04-19 MED ORDER — SODIUM CHLORIDE 0.9 % IV SOLN
Freq: Once | INTRAVENOUS | Status: AC
  Administered 2017-04-19: 12:00:00 via INTRAVENOUS
  Filled 2017-04-19: qty 5

## 2017-04-19 MED ORDER — LORAZEPAM 0.5 MG PO TABS: ORAL_TABLET | ORAL | 0 refills | Status: DC

## 2017-04-19 MED ORDER — SODIUM CHLORIDE 0.9 % IV SOLN
520.0000 mg/m2 | Freq: Once | INTRAVENOUS | Status: AC
  Administered 2017-04-19: 1200 mg via INTRAVENOUS
  Filled 2017-04-19: qty 40

## 2017-04-19 MED ORDER — SODIUM CHLORIDE 0.9 % IV SOLN
15.6000 mg/kg | Freq: Once | INTRAVENOUS | Status: AC
  Administered 2017-04-19: 1600 mg via INTRAVENOUS
  Filled 2017-04-19: qty 64

## 2017-04-19 MED ORDER — PALONOSETRON HCL INJECTION 0.25 MG/5ML
INTRAVENOUS | Status: AC
  Filled 2017-04-19: qty 5

## 2017-04-19 MED ORDER — SODIUM CHLORIDE 0.9 % IV SOLN
705.5000 mg | Freq: Once | INTRAVENOUS | Status: AC
  Administered 2017-04-19: 710 mg via INTRAVENOUS
  Filled 2017-04-19: qty 71

## 2017-04-19 MED ORDER — PALONOSETRON HCL INJECTION 0.25 MG/5ML
0.2500 mg | Freq: Once | INTRAVENOUS | Status: AC
  Administered 2017-04-19: 0.25 mg via INTRAVENOUS

## 2017-04-19 NOTE — Progress Notes (Signed)
Letter to excuse pt from jury duty given to pt.

## 2017-04-19 NOTE — Patient Instructions (Signed)
[facility] Discharge Instructions for Patients Receiving Chemotherapy  Today you received the following chemotherapy agents bevacizumab (Avastin), pemtrexed (Alimta), and Carboplatin.  To help prevent nausea and vomiting after your treatment, we encourage you to take your nausea medication as prescribed.   If you develop nausea and vomiting that is not controlled by your nausea medication, call the clinic.   BELOW ARE SYMPTOMS THAT SHOULD BE REPORTED IMMEDIATELY:  *FEVER GREATER THAN 100.5 F  *CHILLS WITH OR WITHOUT FEVER  NAUSEA AND VOMITING THAT IS NOT CONTROLLED WITH YOUR NAUSEA MEDICATION  *UNUSUAL SHORTNESS OF BREATH  *UNUSUAL BRUISING OR BLEEDING  TENDERNESS IN MOUTH AND THROAT WITH OR WITHOUT PRESENCE OF ULCERS  *URINARY PROBLEMS  *BOWEL PROBLEMS  UNUSUAL RASH Items with * indicate a potential emergency and should be followed up as soon as possible.  Feel free to call the clinic you have any questions or concerns. The clinic phone number is [phone].  Please show the [facility] at check-in to the Emergency Department and triage nurse.

## 2017-04-19 NOTE — Telephone Encounter (Signed)
Picked up [place] forms from Dr. Worthy Flank nurse pod on 04/19/17. Patient indicates that he needs paperwork completed by 04/22/17. Called and spoke to patient's wife to advise that it takes 7-10 business days for forms to be completed and that they wouldn't be completed by 04/22/17 when they are requesting them. Patient's wife requested that they be given back to Nurse Diane whom she originally spoke to and see if she can have them completed by 04/22/17, also requested a callback from [facility] in regards to this. Will talk to her to see

## 2017-04-19 NOTE — Progress Notes (Signed)
Dr. Julien Nordmann okay to tx with BP 154/107 and HR 112.

## 2017-04-19 NOTE — Telephone Encounter (Signed)
appts already  Scheduled per 7/9 los - [facility] to contact patient with CT

## 2017-04-19 NOTE — Progress Notes (Signed)
[facility] [phone]   [phone]  OFFICE PROGRESS NOTE  [name], MD 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  PRIOR THERAPY:  1) Stereotactic radiotherapy to a solitary brain metastasis under the care of Dr. Lisbeth Renshaw on 01/25/2017. 2) Short course of concurrent chemoradiation with weekly carboplatin for AUC of 2 and paclitaxel 45 MG/M2 to the locally advanced disease in the chest. Status post 3 cycles.  CURRENT THERAPY: Systemic chemotherapy with carboplatin for AUC of 5, Alimta 500 MG/M2 and Avastin 15 MG/KG every 3 weeks. First dose of 02/15/2017. Status post 3 cycles.   INTERVAL HISTORY: Calvin Wagner 51 y.o. male returns to the clinic today for follow-up visit accompanied by his wife. The patient is feeling fine today with no specific complaints. He is tolerating his current treatment with systemic chemotherapy fairly well except for a few days of nausea after his treatment. He denied having any chest pain, shortness of breath, cough or hemoptysis. He denied having any fever or chills. He has no vomiting, diarrhea or constipation. He has no significant weight loss or night sweats. He was supposed to have repeat CT scan of the chest, abdomen and pelvis on 04/16/2017 but unfortunately he did not receive an appointment to schedule his scan as ordered. He is here today for evaluation before starting cycle #4.   MEDICAL HISTORY: Past Medical History:  Diagnosis Date  . Encounter for antineoplastic chemotherapy 12/31/2016  . GERD 11/08/2009   Qualifier: Diagnosis of  By: Ronnald Ramp MD, Arvid Right Goals of care,  counseling/discussion 12/31/2016  . HYPERTENSION 11/08/2009   Qualifier: Diagnosis of  By: Ronnald Ramp MD, Arvid Right.   . Large cell carcinoma of left lung, stage 4 ([facility]) 12/31/2016  . Migraine 02/25/2012  . Pneumonia   . PONV (postoperative nausea and vomiting)   . Rotator cuff tear, left     ALLERGIES:  is allergic to no known allergies.  MEDICATIONS:  Current Outpatient Prescriptions  Medication Sig Dispense Refill  . acetaminophen-codeine (TYLENOL #4) 300-60 MG tablet Take 1 tablet by mouth every 4 (four) hours as needed.  0  . budesonide-formoterol (SYMBICORT) 80-4.5 MCG/ACT inhaler Inhale 2 puffs into the lungs 2 (two) times daily. 1 Inhaler 5  . chlorpheniramine-HYDROcodone (TUSSIONEX) 10-8 MG/5ML SUER Take 5 mLs by mouth every 12 (twelve) hours as needed for cough. 473 mL 0  . dexamethasone (DECADRON) 4 MG tablet 4 mg by mouth twice a day the day before, day of and day after the chemotherapy 40 tablet 1  . enoxaparin (LOVENOX) 150 MG/ML injection Inject 1 mL (150 mg total) into the skin daily. 30 Syringe 2  . folic acid (FOLVITE) 1 MG tablet Take 1 tablet (1 mg total) by mouth daily. 30 tablet 4  . LORazepam (ATIVAN) 0.5 MG tablet 1 tablet po q 4-6 hours prn anxiety and one po 30 minutes prior to radiation or MRI 30 tablet 0  . losartan (COZAAR) 25 MG tablet Take 25 mg by mouth daily.  1  . Multiple Vitamin (MULTIVITAMIN WITH MINERALS) TABS tablet Take 1 tablet by mouth daily.    . pantoprazole (PROTONIX) 40 MG tablet Take 40 mg by mouth daily.  2  . prochlorperazine (COMPAZINE) 10 MG tablet Take 1 tablet (10 mg total) by mouth every 6 (six) hours as needed for nausea or vomiting. 30 tablet 0  . sucralfate (CARAFATE) 1 GM/10ML suspension Take 10 mLs (1 g total) by mouth 4 (four) times daily -  with meals and at bedtime. 420 mL 0  . traMADol (ULTRAM) 50 MG tablet Take 1 tablet (50 mg total) by mouth every 12 (twelve) hours as needed. 30 tablet 0   No current facility-administered medications  for this visit.     SURGICAL HISTORY:  Past Surgical History:  Procedure Laterality Date  . INGUINAL HERNIA REPAIR    . SHOULDER ARTHROSCOPY WITH ROTATOR CUFF REPAIR Left 11/12/2016   Procedure: SHOULDER ARTHROSCOPY WITH ROTATOR CUFF REPAIR AND SUBACROMIAL DECOMPRESSION;  Surgeon: Tania Ade, MD;  Location: [facility];  Service: Orthopedics;  Laterality: Left;  SHOULDER ARTHROSCOPY WITH ROTATOR CUFF REPAIR AND SUBACROMIAL DECOMPRESSION  . TONSILLECTOMY    . TOTAL KNEE ARTHROPLASTY    . VIDEO BRONCHOSCOPY Bilateral 12/10/2016   Procedure: VIDEO BRONCHOSCOPY WITHOUT FLUORO;  Surgeon: Tanda Rockers, MD;  Location: [facility];  Service: Cardiopulmonary;  Laterality: Bilateral;    REVIEW OF SYSTEMS:  A comprehensive review of systems was negative except for: Gastrointestinal: positive for nausea   PHYSICAL EXAMINATION: General appearance: alert, cooperative and no distress Head: Normocephalic, without obvious abnormality, atraumatic Neck: no adenopathy, no JVD, supple, symmetrical, trachea midline and thyroid not enlarged, symmetric, no tenderness/mass/nodules Lymph nodes: Cervical, supraclavicular, and axillary nodes normal. Resp: clear to auscultation bilaterally Back: symmetric, no curvature. ROM normal. No CVA tenderness. Cardio: regular rate and rhythm, S1, S2 normal, no murmur, click, rub or gallop GI: soft, non-tender; bowel sounds normal; no masses,  no organomegaly Extremities: extremities normal, atraumatic, no cyanosis or edema  ECOG PERFORMANCE STATUS: 0 - Asymptomatic  Blood pressure (!) 145/103, pulse (!) 120, temperature 98 F (36.7 C), temperature source Oral, resp. rate 20, height 6\' 1"  (1.854 m), weight 227 lb 4.8 oz (103.1 kg), SpO2 100 %.  LABORATORY DATA: Lab Results  Component Value Date   WBC 3.8 (L) 04/19/2017   HGB 12.5 (L) 04/19/2017   HCT 36.2 (L) 04/19/2017   MCV 104.4 (H) 04/19/2017   PLT 167 04/19/2017      Chemistry      Component Value Date/Time     NA 141 04/13/2017 1235   K 4.7 04/13/2017 1235   CL 103 11/26/2016 0438   CO2 28 04/13/2017 1235   BUN 11.4 04/13/2017 1235   CREATININE 1.2 04/13/2017 1235      Component Value Date/Time   CALCIUM 9.8 04/13/2017 1235   ALKPHOS 83 04/13/2017 1235   AST 16 04/13/2017 1235   ALT 24 04/13/2017 1235   BILITOT 0.42 04/13/2017 1235       RADIOGRAPHIC STUDIES: No results found.  ASSESSMENT AND PLAN: This is a very pleasant 51 years old white male with stage IV non-small cell lung cancer, high-grade neuroendocrine carcinoma presented with locally advanced disease in the chest as well as solitary brain metastasis and 2 liver lesions. The patient is status post stereotactic radiotherapy to the solitary brain metastasis as well as short course of concurrent chemoradiation to the locally advanced disease in the chest. He is currently undergoing systemic chemotherapy with carboplatin, Alimta and Avastin status post 3 cycles and has been tolerating this treatment fairly well except for mild nausea after the treatment. I recommended for the patient to take his nausea medicine for  a few days after his treatment. He was supposed to have restaging scan after cycle #3 but unfortunately this was not scheduled. I recommended for the patient to proceed with cycle #4 today as scheduled. I would see him back for follow-up visit in 3 weeks for evaluation after repeating CT scan of the chest, abdomen and pelvis for restaging of his disease before the next cycle of his treatment. For hypertension, the patient will continue with his current blood pressure medication. He did not take his medication this morning. He was advised to call immediately if he has any concerning symptoms in the interval. The patient voices understanding of current disease status and treatment options and is in agreement with the current care plan. All questions were answered. The patient knows to call the clinic with any problems,  questions or concerns. We can certainly see the patient much sooner if necessary.  Disclaimer: This note was dictated with voice recognition software. Similar sounding words can inadvertently be transcribed and may not be corrected upon review.

## 2017-04-20 ENCOUNTER — Telehealth: Payer: Self-pay | Admitting: Medical Oncology

## 2017-04-20 ENCOUNTER — Telehealth: Payer: Self-pay | Admitting: Internal Medicine

## 2017-04-20 NOTE — Telephone Encounter (Signed)
Per patient request, faxed copy of office notes from visit on 04/19/17 to [facility], attn: Verlee Monte Worker to fax [phone].

## 2017-04-20 NOTE — Telephone Encounter (Signed)
Placed disability forms in [place] desk for pickup by wife who has been notified.

## 2017-04-22 ENCOUNTER — Encounter: Payer: Self-pay | Admitting: Internal Medicine

## 2017-04-22 ENCOUNTER — Other Ambulatory Visit: Payer: Self-pay | Admitting: Medical Oncology

## 2017-04-22 DIAGNOSIS — Z5111 Encounter for antineoplastic chemotherapy: Secondary | ICD-10-CM

## 2017-04-22 DIAGNOSIS — C7931 Secondary malignant neoplasm of brain: Secondary | ICD-10-CM

## 2017-04-22 DIAGNOSIS — C3492 Malignant neoplasm of unspecified part of left bronchus or lung: Secondary | ICD-10-CM

## 2017-04-22 MED ORDER — HYDROCOD POLST-CPM POLST ER 10-8 MG/5ML PO SUER: 5.0000 mL | Freq: Two times a day (BID) | ORAL | 0 refills | Status: DC | PRN

## 2017-04-22 NOTE — Progress Notes (Signed)
Received PA request from [place] for Lorazepam.  Called [facility]([place]) to initiate PA for quantity limit override. Answered clinical questions to the best of my ability.   [phone] for call. She states the decision is pending review to clinical pharmacist and would be notified via fax.

## 2017-04-23 ENCOUNTER — Encounter: Payer: Self-pay | Admitting: Internal Medicine

## 2017-04-23 NOTE — Progress Notes (Signed)
Received PA approval from [employer] for [employer]).  PA approved 04/22/17-04/22/18. Faxed copy of approval to [facility]. Fax received ok per confirmation sheet.

## 2017-04-26 ENCOUNTER — Other Ambulatory Visit: Payer: 59

## 2017-05-03 ENCOUNTER — Encounter: Payer: Self-pay | Admitting: *Deleted

## 2017-05-03 ENCOUNTER — Other Ambulatory Visit (HOSPITAL_BASED_OUTPATIENT_CLINIC_OR_DEPARTMENT_OTHER): Payer: 59

## 2017-05-03 DIAGNOSIS — C3492 Malignant neoplasm of unspecified part of left bronchus or lung: Secondary | ICD-10-CM

## 2017-05-03 DIAGNOSIS — C7A1 Malignant poorly differentiated neuroendocrine tumors: Secondary | ICD-10-CM

## 2017-05-03 LAB — CBC WITH DIFFERENTIAL/PLATELET
BASO%: 0.1 % (ref 0.0–2.0)
Basophils Absolute: 0 10*3/uL (ref 0.0–0.1)
EOS%: 0.8 % (ref 0.0–7.0)
Eosinophils Absolute: 0 10*3/uL (ref 0.0–0.5)
HCT: 30.9 % — ABNORMAL LOW (ref 38.4–49.9)
HGB: 10.5 g/dL — ABNORMAL LOW (ref 13.0–17.1)
LYMPH%: 22.7 % (ref 14.0–49.0)
MCH: 38 pg — ABNORMAL HIGH (ref 27.2–33.4)
MCHC: 34 g/dL (ref 32.0–36.0)
MCV: 111.5 fL — ABNORMAL HIGH (ref 79.3–98.0)
MONO#: 0.3 10*3/uL (ref 0.1–0.9)
MONO%: 17.5 % — ABNORMAL HIGH (ref 0.0–14.0)
NEUT#: 1 10*3/uL — ABNORMAL LOW (ref 1.5–6.5)
NEUT%: 58.9 % (ref 39.0–75.0)
Platelets: 110 10*3/uL — ABNORMAL LOW (ref 140–400)
RBC: 2.77 10*6/uL — ABNORMAL LOW (ref 4.20–5.82)
RDW: 22.4 % — ABNORMAL HIGH (ref 11.0–14.6)
WBC: 1.8 10*3/uL — ABNORMAL LOW (ref 4.0–10.3)
lymph#: 0.4 10*3/uL — ABNORMAL LOW (ref 0.9–3.3)

## 2017-05-03 LAB — COMPREHENSIVE METABOLIC PANEL
ALT: 22 U/L (ref 0–55)
AST: 16 U/L (ref 5–34)
Albumin: 3.7 g/dL (ref 3.5–5.0)
Alkaline Phosphatase: 80 U/L (ref 40–150)
Anion Gap: 11 mEq/L (ref 3–11)
BUN: 8.2 mg/dL (ref 7.0–26.0)
CO2: 26 mEq/L (ref 22–29)
Calcium: 9.4 mg/dL (ref 8.4–10.4)
Chloride: 102 mEq/L (ref 98–109)
Creatinine: 1.1 mg/dL (ref 0.7–1.3)
EGFR: 75 mL/min/{1.73_m2} — ABNORMAL LOW (ref >90)
Glucose: 133 mg/dl (ref 70–140)
Potassium: 4 mEq/L (ref 3.5–5.1)
Sodium: 139 mEq/L (ref 136–145)
Total Bilirubin: 0.48 mg/dL (ref 0.20–1.20)
Total Protein: 7 g/dL (ref 6.4–8.3)

## 2017-05-04 ENCOUNTER — Encounter: Payer: Self-pay | Admitting: Genetics

## 2017-05-04 ENCOUNTER — Ambulatory Visit
Admission: RE | Admit: 2017-05-04 | Discharge: 2017-05-04 | Disposition: A | Discharge status: Home / Self Care | Payer: 59 | Source: Ambulatory Visit | Attending: Radiation Oncology | Admitting: Radiation Oncology

## 2017-05-04 ENCOUNTER — Telehealth: Payer: Self-pay | Admitting: Radiation Oncology

## 2017-05-04 DIAGNOSIS — C3490 Malignant neoplasm of unspecified part of unspecified bronchus or lung: Secondary | ICD-10-CM | POA: Diagnosis not present

## 2017-05-04 DIAGNOSIS — C7949 Secondary malignant neoplasm of other parts of nervous system: Principal | ICD-10-CM

## 2017-05-04 DIAGNOSIS — C7931 Secondary malignant neoplasm of brain: Secondary | ICD-10-CM

## 2017-05-04 MED ORDER — GADOBENATE DIMEGLUMINE 529 MG/ML IV SOLN
20.0000 mL | Freq: Once | INTRAVENOUS | Status: AC | PRN
  Administered 2017-05-04: 20 mL via INTRAVENOUS

## 2017-05-04 NOTE — Telephone Encounter (Signed)
I spoke with the patient's wife regarding his MRI. We will discuss in conference but also confirm that the attendees are pleased with what appears to be improvement and regression from a previously enhancing lesion that measured 13 mm, now to cystic change measuring 3 mm that does not enhance. We would still plan on a 3 month MRI moving forward and have a formal visit, so we will cancel his follow up on Thursday, since he already has an appointment for imaging as well.

## 2017-05-06 ENCOUNTER — Ambulatory Visit (HOSPITAL_COMMUNITY): Payer: 59

## 2017-05-06 ENCOUNTER — Ambulatory Visit
Admission: RE | Admit: 2017-05-06 | Discharge: 2017-05-06 | Disposition: A | Discharge status: Home / Self Care | Payer: 59 | Source: Ambulatory Visit | Attending: Radiation Oncology | Admitting: Radiation Oncology

## 2017-05-06 ENCOUNTER — Encounter (HOSPITAL_COMMUNITY): Payer: Self-pay

## 2017-05-06 ENCOUNTER — Ambulatory Visit (HOSPITAL_COMMUNITY)
Admission: RE | Admit: 2017-05-06 | Discharge: 2017-05-06 | Disposition: A | Discharge status: Home / Self Care | Payer: 59 | Source: Ambulatory Visit | Attending: Internal Medicine | Admitting: Internal Medicine

## 2017-05-06 DIAGNOSIS — C7931 Secondary malignant neoplasm of brain: Secondary | ICD-10-CM | POA: Diagnosis not present

## 2017-05-06 DIAGNOSIS — R590 Localized enlarged lymph nodes: Secondary | ICD-10-CM | POA: Diagnosis not present

## 2017-05-06 DIAGNOSIS — R918 Other nonspecific abnormal finding of lung field: Secondary | ICD-10-CM | POA: Insufficient documentation

## 2017-05-06 DIAGNOSIS — C3492 Malignant neoplasm of unspecified part of left bronchus or lung: Secondary | ICD-10-CM | POA: Diagnosis not present

## 2017-05-06 DIAGNOSIS — C3490 Malignant neoplasm of unspecified part of unspecified bronchus or lung: Secondary | ICD-10-CM | POA: Diagnosis not present

## 2017-05-06 DIAGNOSIS — M47896 Other spondylosis, lumbar region: Secondary | ICD-10-CM | POA: Diagnosis not present

## 2017-05-06 DIAGNOSIS — Z5111 Encounter for antineoplastic chemotherapy: Secondary | ICD-10-CM | POA: Insufficient documentation

## 2017-05-06 DIAGNOSIS — M5136 Other intervertebral disc degeneration, lumbar region: Secondary | ICD-10-CM | POA: Diagnosis not present

## 2017-05-06 DIAGNOSIS — I700 Atherosclerosis of aorta: Secondary | ICD-10-CM | POA: Diagnosis not present

## 2017-05-06 DIAGNOSIS — C229 Malignant neoplasm of liver, not specified as primary or secondary: Secondary | ICD-10-CM | POA: Diagnosis not present

## 2017-05-06 MED ORDER — IOPAMIDOL (ISOVUE-300) INJECTION 61%
INTRAVENOUS | Status: AC
  Filled 2017-05-06: qty 100

## 2017-05-06 MED ORDER — IOPAMIDOL (ISOVUE-300) INJECTION 61%
100.0000 mL | Freq: Once | INTRAVENOUS | Status: AC | PRN
  Administered 2017-05-06: 100 mL via INTRAVENOUS

## 2017-05-07 DIAGNOSIS — Z09 Encounter for follow-up examination after completed treatment for conditions other than malignant neoplasm: Secondary | ICD-10-CM | POA: Diagnosis not present

## 2017-05-10 ENCOUNTER — Ambulatory Visit (HOSPITAL_BASED_OUTPATIENT_CLINIC_OR_DEPARTMENT_OTHER): Payer: 59

## 2017-05-10 ENCOUNTER — Other Ambulatory Visit (HOSPITAL_BASED_OUTPATIENT_CLINIC_OR_DEPARTMENT_OTHER): Payer: 59

## 2017-05-10 ENCOUNTER — Encounter: Payer: Self-pay | Admitting: Internal Medicine

## 2017-05-10 ENCOUNTER — Other Ambulatory Visit: Payer: Self-pay | Admitting: Internal Medicine

## 2017-05-10 ENCOUNTER — Ambulatory Visit (HOSPITAL_BASED_OUTPATIENT_CLINIC_OR_DEPARTMENT_OTHER): Payer: 59 | Admitting: Internal Medicine

## 2017-05-10 VITALS — BP 149/97 | HR 90 | Temp 98.4°F | Resp 20 | Ht 73.0 in | Wt 227.8 lb

## 2017-05-10 VITALS — BP 151/89

## 2017-05-10 DIAGNOSIS — Z5111 Encounter for antineoplastic chemotherapy: Secondary | ICD-10-CM | POA: Diagnosis not present

## 2017-05-10 DIAGNOSIS — I10 Essential (primary) hypertension: Secondary | ICD-10-CM | POA: Diagnosis not present

## 2017-05-10 DIAGNOSIS — Z5112 Encounter for antineoplastic immunotherapy: Secondary | ICD-10-CM | POA: Diagnosis not present

## 2017-05-10 DIAGNOSIS — C7A1 Malignant poorly differentiated neuroendocrine tumors: Secondary | ICD-10-CM

## 2017-05-10 DIAGNOSIS — C7B8 Other secondary neuroendocrine tumors: Secondary | ICD-10-CM

## 2017-05-10 DIAGNOSIS — C3492 Malignant neoplasm of unspecified part of left bronchus or lung: Secondary | ICD-10-CM

## 2017-05-10 DIAGNOSIS — C7931 Secondary malignant neoplasm of brain: Secondary | ICD-10-CM

## 2017-05-10 LAB — COMPREHENSIVE METABOLIC PANEL
ALT: 23 U/L (ref 0–55)
AST: 18 U/L (ref 5–34)
Albumin: 3.7 g/dL (ref 3.5–5.0)
Alkaline Phosphatase: 77 U/L (ref 40–150)
Anion Gap: 10 mEq/L (ref 3–11)
BUN: 6.7 mg/dL — ABNORMAL LOW (ref 7.0–26.0)
CO2: 25 mEq/L (ref 22–29)
Calcium: 9.5 mg/dL (ref 8.4–10.4)
Chloride: 104 mEq/L (ref 98–109)
Creatinine: 1 mg/dL (ref 0.7–1.3)
EGFR: 86 mL/min/{1.73_m2} — ABNORMAL LOW (ref >90)
Glucose: 91 mg/dl (ref 70–140)
Potassium: 3.9 mEq/L (ref 3.5–5.1)
Sodium: 139 mEq/L (ref 136–145)
Total Bilirubin: 0.39 mg/dL (ref 0.20–1.20)
Total Protein: 6.9 g/dL (ref 6.4–8.3)

## 2017-05-10 LAB — CBC WITH DIFFERENTIAL/PLATELET
BASO%: 0 % (ref 0.0–2.0)
Basophils Absolute: 0 10*3/uL (ref 0.0–0.1)
EOS%: 0.5 % (ref 0.0–7.0)
Eosinophils Absolute: 0 10*3/uL (ref 0.0–0.5)
HCT: 32.5 % — ABNORMAL LOW (ref 38.4–49.9)
HGB: 11 g/dL — ABNORMAL LOW (ref 13.0–17.1)
LYMPH%: 21.9 % (ref 14.0–49.0)
MCH: 38.6 pg — ABNORMAL HIGH (ref 27.2–33.4)
MCHC: 33.8 g/dL (ref 32.0–36.0)
MCV: 114 fL — ABNORMAL HIGH (ref 79.3–98.0)
MONO#: 0.5 10*3/uL (ref 0.1–0.9)
MONO%: 23 % — ABNORMAL HIGH (ref 0.0–14.0)
NEUT#: 1.1 10*3/uL — ABNORMAL LOW (ref 1.5–6.5)
NEUT%: 54.6 % (ref 39.0–75.0)
Platelets: 135 10*3/uL — ABNORMAL LOW (ref 140–400)
RBC: 2.85 10*6/uL — ABNORMAL LOW (ref 4.20–5.82)
RDW: 21.4 % — ABNORMAL HIGH (ref 11.0–14.6)
WBC: 2 10*3/uL — ABNORMAL LOW (ref 4.0–10.3)
lymph#: 0.4 10*3/uL — ABNORMAL LOW (ref 0.9–3.3)
nRBC: 0 % (ref 0–0)

## 2017-05-10 LAB — UA PROTEIN, DIPSTICK - CHCC: Protein, ur: NEGATIVE mg/dL

## 2017-05-10 MED ORDER — SODIUM CHLORIDE 0.9 % IV SOLN
520.0000 mg/m2 | Freq: Once | INTRAVENOUS | Status: AC
  Administered 2017-05-10: 1200 mg via INTRAVENOUS
  Filled 2017-05-10: qty 40

## 2017-05-10 MED ORDER — PALONOSETRON HCL INJECTION 0.25 MG/5ML
INTRAVENOUS | Status: AC
  Filled 2017-05-10: qty 5

## 2017-05-10 MED ORDER — SODIUM CHLORIDE 0.9 % IV SOLN
15.7000 mg/kg | Freq: Once | INTRAVENOUS | Status: AC
  Administered 2017-05-10: 1600 mg via INTRAVENOUS
  Filled 2017-05-10: qty 64

## 2017-05-10 MED ORDER — PALONOSETRON HCL INJECTION 0.25 MG/5ML
0.2500 mg | Freq: Once | INTRAVENOUS | Status: AC
  Administered 2017-05-10: 0.25 mg via INTRAVENOUS

## 2017-05-10 MED ORDER — SODIUM CHLORIDE 0.9 % IV SOLN
Freq: Once | INTRAVENOUS | Status: AC
  Administered 2017-05-10: 11:00:00 via INTRAVENOUS
  Filled 2017-05-10: qty 5

## 2017-05-10 MED ORDER — SODIUM CHLORIDE 0.9 % IV SOLN
Freq: Once | INTRAVENOUS | Status: AC
  Administered 2017-05-10: 11:00:00 via INTRAVENOUS

## 2017-05-10 MED ORDER — CARBOPLATIN CHEMO INJECTION 600 MG/60ML
750.0000 mg | Freq: Once | INTRAVENOUS | Status: AC
  Administered 2017-05-10: 750 mg via INTRAVENOUS
  Filled 2017-05-10: qty 75

## 2017-05-10 NOTE — Progress Notes (Signed)
Per Dr Julien Nordmann ok to tx today with [place] 1.1.

## 2017-05-10 NOTE — Patient Instructions (Addendum)
[facility] Discharge Instructions for Patients Receiving Chemotherapy  Today you received the following chemotherapy agents bevacizumab (Avastin), pemtrexed (Alimta), and Carboplatin.  To help prevent nausea and vomiting after your treatment, we encourage you to take your nausea medication as prescribed.   If you develop nausea and vomiting that is not controlled by your nausea medication, call the clinic.   BELOW ARE SYMPTOMS THAT SHOULD BE REPORTED IMMEDIATELY:  *FEVER GREATER THAN 100.5 F  *CHILLS WITH OR WITHOUT FEVER  NAUSEA AND VOMITING THAT IS NOT CONTROLLED WITH YOUR NAUSEA MEDICATION  *UNUSUAL SHORTNESS OF BREATH  *UNUSUAL BRUISING OR BLEEDING  TENDERNESS IN MOUTH AND THROAT WITH OR WITHOUT PRESENCE OF ULCERS  *URINARY PROBLEMS  *BOWEL PROBLEMS  UNUSUAL RASH Items with * indicate a potential emergency and should be followed up as soon as possible.  Feel free to call the clinic you have any questions or concerns. The clinic phone number is [phone].  Please show the [facility] at check-in to the Emergency Department and triage nurse.   Managing Low Blood Counts During Cancer Treatment Cancer treatments such as chemotherapy and radiation can sometimes cause a drop in the supply of blood cells in the body, including red blood cells, white blood cells, and platelets. These blood cells are produced in the body and are released into the blood to perform specific functions:  Red blood cells carry gases such as oxygen and carbon dioxide to and from your lungs.  White blood cells help protect you from infection.  Platelets help your body to form blood clots to prevent and control bleeding.  When cancer treatments cause a drop in blood cell counts, your body may not have enough cells to keep up its normal functions. Symptoms or problems that may result will vary depending on which type of blood cells the treatment is affecting. If your blood counts  are low, you can take steps to help manage any problems. How can low blood counts affect me? Low blood counts have various effects depending on the type of blood cells involved:  If you have a low number of red blood cells, you have a condition called anemia. This can cause symptoms such as: ? Feeling tired and weak. ? Feeling light-headed. ? Being short of breath.  If you have a low number of white blood cells, you may be at higher risk for infections.  If you have a low number of platelets, you may bleed more easily, or your body may have trouble stopping any bleeding. You may also have more bruising.  How to manage symptoms or prevent problems from a low blood count If you have a low blood count, you can take steps to manage symptoms or prevent problems that may develop. The steps to take will depend on which type of blood cell is low. Low red blood cells Take these steps to help manage the symptoms of anemia:  Go for a walk or do some light exercise each day.  Take short naps during the day.  Eat foods that contain a lot of iron and protein. These include leafy vegetables, meat and fish, beans, sweet potatoes, and dried fruit such as prunes, raisins, and apricots.  Ask for help with errands and with work that needs to be done around the house. It is important to save your energy.  Take vitamins or supplements-such as iron, vitamin Q25, or folic acid-as told by your health care provider.  Practice relaxation techniques, such as yoga  or meditation.  Low white blood cells Take these steps to help prevent infections:  Wash your hands often with warm, soapy water.  Avoid crowds of people and any person who has the flu or a fever.  Take care when cleaning yourself after using the bathroom. Tell your health care provider if you have any rectal sores or bleeding.  Avoid dental work. Check your mouth each day for sores or signs of infection.  Do not share utensils.  Avoid  contact with pet waste. Wash your hands after handling pets.  If you get a scrape or cut, clean it thoroughly right away.  Avoid fresh plants or dried flowers.  Do not swim or wade in lakes, ponds, rivers, water parks, or hot tubs.  Follow food safety guidelines. Cook meat thoroughly and wash all raw fruits and vegetables.  You may be instructed to wear a mask when around others to protect yourself.  Low platelets Take these steps to help prevent or control bleeding and bruising:  Use an electric razor for shaving instead of a blade.  Use a soft toothbrush and be careful during oral care. Talk with your cancer care team about whether you should avoid flossing. If your mouth is bleeding, rinse it with ice water.  Avoid activities that could cause injury, such as contact sports.  Talk with your health care provider about using laxatives or stool softeners to avoid constipation.  Do not use medicines such as ibuprofen, aspirin, or naproxen unless your health care provider tells you to.  Limit alcohol use.  Monitor any bleeding closely. If you start bleeding, hold pressure on the area for 5 minutes to stop the bleeding. Bleeding that does not stop is considered an emergency.  What treatments can help increase a low blood count? If needed, your health care provider may recommend treatment for a low blood count. Treatment will depend on the type of blood cell that is low and the severity of your condition. Treatment options may include:  Taking medicines to help stimulate the growth of blood cells. This is an option for treating a low red blood cell count. Your health care provider may also recommend that you take iron, folic acid, or vitamin B12 supplements.  Making dietary changes. Including more iron and protein in your diet can help stimulate the growth of red blood cells.  Adjusting your current medicines to help raise blood counts.  Making changes to your treatment  plan.  Having a blood transfusion. This may be done if your blood count is very low.  Contact a health care provider if:  You feel extremely tired and weak.  You have more bruising or bleeding.  You feel ill or you develop a cough.  You have swelling or redness.  You have mouth sores or a sore throat.  You have painful urination or you have blood in your urine or stool.  You are thinking of taking any new supplements or vitamins or making dietary changes. Get help right away if:  You are short of breath, have chest pain, or feel dizzy.  You have a fever or chills.  You have abdominal pain or diarrhea.  You have bleeding that will not stop. Summary  Cancer treatments such as chemotherapy and radiation can sometimes cause a drop in the supply of blood cells in the body, including red blood cells, white blood cells, and platelets.  If you have a low blood count, you can take steps to manage symptoms or prevent  problems that may develop.  Depending on which type of blood cell is low, you may need to take steps to prevent infection, prevent bleeding, or manage symptoms that may develop.  If needed, your health care provider may recommend treatment for a low blood count. This information is not intended to replace advice given to you by your health care provider. Make sure you discuss any questions you have with your health care provider. Document Released: 05/23/2016 Document Revised: 05/23/2016 Document Reviewed: 05/23/2016 Elsevier Interactive Patient Education  [employer].

## 2017-05-10 NOTE — Progress Notes (Signed)
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  PRIOR THERAPY:  1) Stereotactic radiotherapy to a solitary brain metastasis under the care of Dr. Lisbeth Renshaw on 01/25/2017. 2) Short course of concurrent chemoradiation with weekly carboplatin for AUC of 2 and paclitaxel 45 MG/M2 to the locally advanced disease in the chest. Status post 3 cycles.  CURRENT THERAPY: Systemic chemotherapy with carboplatin for AUC of 5, Alimta 500 MG/M2 and Avastin 15 MG/KG every 3 weeks. First dose of 02/15/2017. Status post 4 cycles.   INTERVAL HISTORY: Calvin Wagner 51 y.o. male returns to the clinic today for follow-up visit accompanied by his wife. The patient is feeling fine today with no specific complaints except for fatigue. He denied having any chest pain but continues to have dry cough with no shortness of breath or hemoptysis. He denied having any fever or chills. He has no significant weight loss or night sweats. The patient denied having any nausea, vomiting, diarrhea or constipation. He had repeat CT scan of the chest, abdomen and pelvis performed recently and he is here for evaluation and discussion of his scan results.  MEDICAL HISTORY: Past Medical History:  Diagnosis Date  . Encounter for antineoplastic chemotherapy 12/31/2016  . GERD 11/08/2009   Qualifier: Diagnosis of  By: Ronnald Ramp MD, Arvid Right Goals of care, counseling/discussion 12/31/2016  . History of radiation therapy 01/13/2017 - 02/02/2017   Site/dose:  Left lung: 30 Gy in 15 fractions  . HYPERTENSION  11/08/2009   Qualifier: Diagnosis of  By: Ronnald Ramp MD, Arvid Right.   . Large cell carcinoma of left lung, stage 4 ([place]) 12/31/2016  . Migraine 02/25/2012  . Pneumonia   . PONV (postoperative nausea and vomiting)   . Rotator cuff tear, left     ALLERGIES:  is allergic to no known allergies.  MEDICATIONS:  Current Outpatient Prescriptions  Medication Sig Dispense Refill  . acetaminophen-codeine (TYLENOL #4) 300-60 MG tablet Take 1 tablet by mouth every 4 (four) hours as needed.  0  . budesonide-formoterol (SYMBICORT) 80-4.5 MCG/ACT inhaler Inhale 2 puffs into the lungs 2 (two) times daily. 1 Inhaler 5  . chlorpheniramine-HYDROcodone (TUSSIONEX) 10-8 MG/5ML SUER Take 5 mLs by mouth every 12 (twelve) hours as needed for cough. 473 mL 0  . dexamethasone (DECADRON) 4 MG tablet 4 mg by mouth twice a day the day before, day of and day after the chemotherapy 40 tablet 1  . enoxaparin (LOVENOX) 150 MG/ML injection Inject 1 mL (150 mg total) into the skin daily. 30 Syringe 2  . folic acid (FOLVITE) 1 MG tablet Take 1 tablet (1 mg total) by mouth daily. 30 tablet 4  . LORazepam (ATIVAN) 0.5 MG tablet 1 tablet po q 4-6 hours prn anxiety and one po 30 minutes prior to radiation or MRI 30 tablet 0  . losartan (COZAAR) 25 MG tablet Take 25 mg by mouth daily.  1  . Multiple Vitamin (MULTIVITAMIN WITH MINERALS) TABS tablet Take 1 tablet by mouth daily.    . pantoprazole (PROTONIX) 40 MG tablet Take 40 mg by mouth daily.  2  .  prochlorperazine (COMPAZINE) 10 MG tablet Take 1 tablet (10 mg total) by mouth every 6 (six) hours as needed for nausea or vomiting. 30 tablet 0  . sucralfate (CARAFATE) 1 GM/10ML suspension Take 10 mLs (1 g total) by mouth 4 (four) times daily -  with meals and at bedtime. 420 mL 0  . traMADol (ULTRAM) 50 MG tablet Take 1 tablet (50 mg total) by mouth every 12 (twelve) hours as needed. 30 tablet 0   No current facility-administered medications for this visit.     SURGICAL HISTORY:  Past  Surgical History:  Procedure Laterality Date  . INGUINAL HERNIA REPAIR    . SHOULDER ARTHROSCOPY WITH ROTATOR CUFF REPAIR Left 11/12/2016   Procedure: SHOULDER ARTHROSCOPY WITH ROTATOR CUFF REPAIR AND SUBACROMIAL DECOMPRESSION;  Surgeon: Tania Ade, MD;  Location: [facility];  Service: Orthopedics;  Laterality: Left;  SHOULDER ARTHROSCOPY WITH ROTATOR CUFF REPAIR AND SUBACROMIAL DECOMPRESSION  . TONSILLECTOMY    . TOTAL KNEE ARTHROPLASTY    . VIDEO BRONCHOSCOPY Bilateral 12/10/2016   Procedure: VIDEO BRONCHOSCOPY WITHOUT FLUORO;  Surgeon: Tanda Rockers, MD;  Location: [facility];  Service: Cardiopulmonary;  Laterality: Bilateral;    REVIEW OF SYSTEMS:  Constitutional: positive for fatigue Eyes: negative Ears, nose, mouth, throat, and face: negative Respiratory: negative Cardiovascular: negative Gastrointestinal: negative Genitourinary:negative Integument/breast: negative Hematologic/lymphatic: negative Musculoskeletal:negative Neurological: negative Behavioral/Psych: negative Endocrine: negative Allergic/Immunologic: negative   PHYSICAL EXAMINATION: General appearance: alert, cooperative, fatigued and no distress Head: Normocephalic, without obvious abnormality, atraumatic Neck: no adenopathy, no JVD, supple, symmetrical, trachea midline and thyroid not enlarged, symmetric, no tenderness/mass/nodules Lymph nodes: Cervical, supraclavicular, and axillary nodes normal. Resp: clear to auscultation bilaterally Back: symmetric, no curvature. ROM normal. No CVA tenderness. Cardio: regular rate and rhythm, S1, S2 normal, no murmur, click, rub or gallop GI: soft, non-tender; bowel sounds normal; no masses,  no organomegaly Extremities: extremities normal, atraumatic, no cyanosis or edema Neurologic: Alert and oriented X 3, normal strength and tone. Normal symmetric reflexes. Normal coordination and gait  ECOG PERFORMANCE STATUS: 0 - Asymptomatic  Blood pressure (!) 149/97, pulse 90,  temperature 98.4 F (36.9 C), temperature source Oral, resp. rate 20, height 6\' 1"  (1.854 m), weight 227 lb 12.8 oz (103.3 kg), SpO2 100 %.  LABORATORY DATA: Lab Results  Component Value Date   WBC 2.0 (L) 05/10/2017   HGB 11.0 (L) 05/10/2017   HCT 32.5 (L) 05/10/2017   MCV 114.0 (H) 05/10/2017   PLT 135 (L) 05/10/2017      Chemistry      Component Value Date/Time   NA 139 05/03/2017 1228   K 4.0 05/03/2017 1228   CL 103 11/26/2016 0438   CO2 26 05/03/2017 1228   BUN 8.2 05/03/2017 1228   CREATININE 1.1 05/03/2017 1228      Component Value Date/Time   CALCIUM 9.4 05/03/2017 1228   ALKPHOS 80 05/03/2017 1228   AST 16 05/03/2017 1228   ALT 22 05/03/2017 1228   BILITOT 0.48 05/03/2017 1228       RADIOGRAPHIC STUDIES: Ct Chest W Contrast  Result Date: 05/06/2017 CLINICAL DATA:  Lung cancer metastatic to brain and liver. Prior radiation therapy. Chronic cough. Ongoing chemotherapy. Nausea. EXAM: CT CHEST, ABDOMEN, AND PELVIS WITH CONTRAST TECHNIQUE: Multidetector CT imaging of the chest, abdomen and pelvis was performed following the standard protocol during bolus administration of intravenous contrast. CONTRAST:  183mL ISOVUE-300 IOPAMIDOL (ISOVUE-300) INJECTION 61% COMPARISON:  Multiple exams, including 12/17/2016 PET-CT and CT examinations from February 2018. FINDINGS: CT CHEST  FINDINGS Cardiovascular: Unremarkable Mediastinum/Nodes: Left paraesophageal lymph node 0.7 cm in short axis on image 37/2, previously 1.4 cm on 12/17/2016. Left infrahilar lymph node 0.9 cm in short axis on image 39/2, formerly 2.3 cm on 12/17/2016. Lungs/Pleura: 0.9 by 0.7 cm ground-glass opacity posteriorly in the left upper lobe on image 70/6, new compared the prior exam. Faint left upper lobe perihilar ground-glass opacities but the more confluent left perihilar nodular and airspace opacities have resolved. In the left lower lobe there is some residual bronchial occlusion the lateral basal segmental and  posterior basal segmental bronchi for example on image 98/6, although the degree of nodularity in this region is less. There is some cylindrical bronchiectasis laterally in the left lower lobe, and considerable airway plugging medially in the left lower lobe (particularly the B10B and B10C bronchi). In addition to this airway plugging there may be some peribronchovascular nodularity in the left lower lobe. Peripherally in the left lower lobe, a subpleural 0.8 by 0.8 cm nodule is present on image 142/6 (formerly 1.1 by 0.9 cm). Musculoskeletal: Bilateral mesoacromial os acromiale. Mild levoconvex upper thoracic scoliosis. CT ABDOMEN PELVIS FINDINGS Hepatobiliary: The previous segment 5 lesion shown on PET-CT is not readily seen on today's exam. Gallbladder unremarkable. Pancreas: Unremarkable Spleen: Unremarkable Adrenals/Urinary Tract: 0.9 cm exophytic lesion of the right kidney lower pole appear stable and is likely a small cyst. Adrenal glands normal. Stomach/Bowel: Unremarkable Vascular/Lymphatic: Minimal abdominal aortic atherosclerotic calcification. No adenopathy in the abdomen. Reproductive: Unremarkable Other: Unremarkable Musculoskeletal: Hernia mesh is noted along the anterior pelvis inferiorly. Small umbilical hernia contains adipose tissue. Lower lumbar spondylosis and degenerative disc disease at L5-S1. Considerable facet arthropathy with spurring at L3-4. Degenerative disc disease at L3-4 and L4-5, at L3-4 there is right foraminal stenosis due to disc bulge and facet arthropathy. IMPRESSION: 1. Reduced size of the left infrahilar and paraesophageal adenopathy and reduced size of the left lower lobe peribronchovascular tumor/nodularity. There continues to be proximal occlusion of the lateral basal segmental and posterior basal segmental bronchi, with mucus or tumor plugging of the airways medially in the left lower lobe. Modestly reduced size of the left lower lobe pulmonary nodule. However, overall the  intrathoracic component of malignancy is considerably reduced. 2. The previously hypermetabolic liver lesion in segment 5 is no longer readily apparent by CT. 3. No new metastatic disease is identified. 4. Other imaging findings of potential clinical significance: Aortic Atherosclerosis (ICD10-I70.0). Lumbar spondylosis and degenerative disc disease causing impingement at several levels. Electronically Signed   By: Van Clines M.D.   On: 05/06/2017 15:24   Mr Jeri Cos MV Contrast  Result Date: 05/04/2017 CLINICAL DATA:  Followup metastatic lung cancer. EXAM: MRI HEAD WITHOUT AND WITH CONTRAST TECHNIQUE: Multiplanar, multiecho pulse sequences of the brain and surrounding structures were obtained without and with intravenous contrast. CONTRAST:  32mL MULTIHANCE GADOBENATE DIMEGLUMINE 529 MG/ML IV SOLN COMPARISON:  01/16/2017.  01/02/2017. FINDINGS: Brain: Previously seen 13 mm ring-enhancing metastasis in the inferior frontal lobe on the left is now visible only as a 3 mm nonenhancing cyst. There is no new lesion. The brain otherwise appears normal. No hydrocephalus or extra-axial collection. Vascular: Major vessels at the base of the brain show flow. Skull and upper cervical spine: Negative Sinuses/Orbits: No significant sinus inflammatory disease. Orbits negative. Other: None IMPRESSION: Previously seen necrotic 13 mm inferior frontal metastasis on the left is now visible only as a 3 mm nonenhancing cyst. The remainder the brain is negative. Electronically Signed   By:  Nelson Chimes M.D.   On: 05/04/2017 15:20   Ct Abdomen Pelvis W Contrast  Result Date: 05/06/2017 CLINICAL DATA:  Lung cancer metastatic to brain and liver. Prior radiation therapy. Chronic cough. Ongoing chemotherapy. Nausea. EXAM: CT CHEST, ABDOMEN, AND PELVIS WITH CONTRAST TECHNIQUE: Multidetector CT imaging of the chest, abdomen and pelvis was performed following the standard protocol during bolus administration of intravenous  contrast. CONTRAST:  157mL ISOVUE-300 IOPAMIDOL (ISOVUE-300) INJECTION 61% COMPARISON:  Multiple exams, including 12/17/2016 PET-CT and CT examinations from February 2018. FINDINGS: CT CHEST FINDINGS Cardiovascular: Unremarkable Mediastinum/Nodes: Left paraesophageal lymph node 0.7 cm in short axis on image 37/2, previously 1.4 cm on 12/17/2016. Left infrahilar lymph node 0.9 cm in short axis on image 39/2, formerly 2.3 cm on 12/17/2016. Lungs/Pleura: 0.9 by 0.7 cm ground-glass opacity posteriorly in the left upper lobe on image 70/6, new compared the prior exam. Faint left upper lobe perihilar ground-glass opacities but the more confluent left perihilar nodular and airspace opacities have resolved. In the left lower lobe there is some residual bronchial occlusion the lateral basal segmental and posterior basal segmental bronchi for example on image 98/6, although the degree of nodularity in this region is less. There is some cylindrical bronchiectasis laterally in the left lower lobe, and considerable airway plugging medially in the left lower lobe (particularly the B10B and B10C bronchi). In addition to this airway plugging there may be some peribronchovascular nodularity in the left lower lobe. Peripherally in the left lower lobe, a subpleural 0.8 by 0.8 cm nodule is present on image 142/6 (formerly 1.1 by 0.9 cm). Musculoskeletal: Bilateral mesoacromial os acromiale. Mild levoconvex upper thoracic scoliosis. CT ABDOMEN PELVIS FINDINGS Hepatobiliary: The previous segment 5 lesion shown on PET-CT is not readily seen on today's exam. Gallbladder unremarkable. Pancreas: Unremarkable Spleen: Unremarkable Adrenals/Urinary Tract: 0.9 cm exophytic lesion of the right kidney lower pole appear stable and is likely a small cyst. Adrenal glands normal. Stomach/Bowel: Unremarkable Vascular/Lymphatic: Minimal abdominal aortic atherosclerotic calcification. No adenopathy in the abdomen. Reproductive: Unremarkable Other:  Unremarkable Musculoskeletal: Hernia mesh is noted along the anterior pelvis inferiorly. Small umbilical hernia contains adipose tissue. Lower lumbar spondylosis and degenerative disc disease at L5-S1. Considerable facet arthropathy with spurring at L3-4. Degenerative disc disease at L3-4 and L4-5, at L3-4 there is right foraminal stenosis due to disc bulge and facet arthropathy. IMPRESSION: 1. Reduced size of the left infrahilar and paraesophageal adenopathy and reduced size of the left lower lobe peribronchovascular tumor/nodularity. There continues to be proximal occlusion of the lateral basal segmental and posterior basal segmental bronchi, with mucus or tumor plugging of the airways medially in the left lower lobe. Modestly reduced size of the left lower lobe pulmonary nodule. However, overall the intrathoracic component of malignancy is considerably reduced. 2. The previously hypermetabolic liver lesion in segment 5 is no longer readily apparent by CT. 3. No new metastatic disease is identified. 4. Other imaging findings of potential clinical significance: Aortic Atherosclerosis (ICD10-I70.0). Lumbar spondylosis and degenerative disc disease causing impingement at several levels. Electronically Signed   By: Van Clines M.D.   On: 05/06/2017 15:24    ASSESSMENT AND PLAN: This is a very pleasant 51 years old white male with stage IV non-small cell lung cancer, high-grade neuroendocrine carcinoma presented with locally advanced disease in the chest as well as solitary brain metastasis and 2 liver lesions. The patient is status post stereotactic radiotherapy to the solitary brain metastasis as well as short course of concurrent chemoradiation to the locally  advanced disease in the chest. He is currently undergoing systemic chemotherapy with carboplatin, Alimta and Avastin status post 4 cycles and has been tolerating this treatment fairly well except for mild fatigue. He had repeat CT scan of the  chest, abdomen and pelvis performed recently. I personally and independently reviewed the scan images and discuss the results with the patient and his wife. History the scan showed improvement of his disease. I recommended for the patient to continue on the same treatment with carboplatin, Alimta and Avastin and he will proceed with cycle #5 today as scheduled. I will see him back for follow-up visit in 3 weeks for evaluation before starting cycle #6. For hypertension, the patient was advised to continue monitoring his blood pressure closely at home and to report to his primary care physician if persisted to be elevated. The patient was advised to call immediately she has any concerning symptoms in the interval. The patient voices understanding of current disease status and treatment options and is in agreement with the current care plan. All questions were answered. The patient knows to call the clinic with any problems, questions or concerns. We can certainly see the patient much sooner if necessary.  Disclaimer: This note was dictated with voice recognition software. Similar sounding words can inadvertently be transcribed and may not be corrected upon review.

## 2017-05-17 ENCOUNTER — Other Ambulatory Visit: Payer: 59

## 2017-05-18 ENCOUNTER — Other Ambulatory Visit: Payer: 59

## 2017-05-18 ENCOUNTER — Telehealth: Payer: Self-pay | Admitting: Medical Oncology

## 2017-05-18 NOTE — Telephone Encounter (Addendum)
FMLA papers ready . Placed up front with [employer] . spoke to wife , [place] beach [place] and she will pick up papers on Friday or Monday . She cancelled [facility] lab appt for this Friday .

## 2017-05-24 ENCOUNTER — Other Ambulatory Visit: Payer: 59

## 2017-05-25 ENCOUNTER — Other Ambulatory Visit (HOSPITAL_BASED_OUTPATIENT_CLINIC_OR_DEPARTMENT_OTHER): Payer: 59

## 2017-05-25 DIAGNOSIS — C7A1 Malignant poorly differentiated neuroendocrine tumors: Secondary | ICD-10-CM | POA: Diagnosis not present

## 2017-05-25 DIAGNOSIS — C3492 Malignant neoplasm of unspecified part of left bronchus or lung: Secondary | ICD-10-CM

## 2017-05-25 LAB — CBC WITH DIFFERENTIAL/PLATELET
BASO%: 0 % (ref 0.0–2.0)
Basophils Absolute: 0 10*3/uL (ref 0.0–0.1)
EOS%: 0.9 % (ref 0.0–7.0)
Eosinophils Absolute: 0 10*3/uL (ref 0.0–0.5)
HCT: 27.5 % — ABNORMAL LOW (ref 38.4–49.9)
HGB: 9.4 g/dL — ABNORMAL LOW (ref 13.0–17.1)
LYMPH%: 22.7 % (ref 14.0–49.0)
MCH: 39.8 pg — ABNORMAL HIGH (ref 27.2–33.4)
MCHC: 34.2 g/dL (ref 32.0–36.0)
MCV: 116.5 fL — ABNORMAL HIGH (ref 79.3–98.0)
MONO#: 0.3 10*3/uL (ref 0.1–0.9)
MONO%: 14.4 % — ABNORMAL HIGH (ref 0.0–14.0)
NEUT#: 1.3 10*3/uL — ABNORMAL LOW (ref 1.5–6.5)
NEUT%: 62 % (ref 39.0–75.0)
Platelets: 58 10*3/uL — ABNORMAL LOW (ref 140–400)
RBC: 2.36 10*6/uL — ABNORMAL LOW (ref 4.20–5.82)
RDW: 16.6 % — ABNORMAL HIGH (ref 11.0–14.6)
WBC: 2.2 10*3/uL — ABNORMAL LOW (ref 4.0–10.3)
lymph#: 0.5 10*3/uL — ABNORMAL LOW (ref 0.9–3.3)
nRBC: 0 % (ref 0–0)

## 2017-05-25 LAB — COMPREHENSIVE METABOLIC PANEL
ALT: 22 U/L (ref 0–55)
AST: 17 U/L (ref 5–34)
Albumin: 3.5 g/dL (ref 3.5–5.0)
Alkaline Phosphatase: 77 U/L (ref 40–150)
Anion Gap: 9 mEq/L (ref 3–11)
BUN: 9.8 mg/dL (ref 7.0–26.0)
CO2: 27 mEq/L (ref 22–29)
Calcium: 9.7 mg/dL (ref 8.4–10.4)
Chloride: 105 mEq/L (ref 98–109)
Creatinine: 1.1 mg/dL (ref 0.7–1.3)
EGFR: 78 mL/min/{1.73_m2} — ABNORMAL LOW (ref >90)
Glucose: 159 mg/dl — ABNORMAL HIGH (ref 70–140)
Potassium: 4.2 mEq/L (ref 3.5–5.1)
Sodium: 141 mEq/L (ref 136–145)
Total Bilirubin: 0.25 mg/dL (ref 0.20–1.20)
Total Protein: 7 g/dL (ref 6.4–8.3)

## 2017-05-28 ENCOUNTER — Encounter: Payer: Self-pay | Admitting: Pharmacist

## 2017-05-31 ENCOUNTER — Telehealth: Payer: Self-pay | Admitting: Internal Medicine

## 2017-05-31 ENCOUNTER — Ambulatory Visit: Payer: 59

## 2017-05-31 ENCOUNTER — Ambulatory Visit (HOSPITAL_BASED_OUTPATIENT_CLINIC_OR_DEPARTMENT_OTHER): Payer: 59 | Admitting: Internal Medicine

## 2017-05-31 ENCOUNTER — Other Ambulatory Visit (HOSPITAL_BASED_OUTPATIENT_CLINIC_OR_DEPARTMENT_OTHER): Payer: 59

## 2017-05-31 ENCOUNTER — Encounter: Payer: Self-pay | Admitting: Internal Medicine

## 2017-05-31 VITALS — BP 143/98 | HR 106 | Temp 98.7°F | Resp 20 | Ht 73.0 in | Wt 226.9 lb

## 2017-05-31 DIAGNOSIS — H538 Other visual disturbances: Secondary | ICD-10-CM | POA: Diagnosis not present

## 2017-05-31 DIAGNOSIS — D696 Thrombocytopenia, unspecified: Secondary | ICD-10-CM

## 2017-05-31 DIAGNOSIS — K769 Liver disease, unspecified: Secondary | ICD-10-CM | POA: Diagnosis not present

## 2017-05-31 DIAGNOSIS — C3492 Malignant neoplasm of unspecified part of left bronchus or lung: Secondary | ICD-10-CM

## 2017-05-31 DIAGNOSIS — C7A1 Malignant poorly differentiated neuroendocrine tumors: Secondary | ICD-10-CM | POA: Diagnosis not present

## 2017-05-31 DIAGNOSIS — C7B8 Other secondary neuroendocrine tumors: Secondary | ICD-10-CM

## 2017-05-31 DIAGNOSIS — J90 Pleural effusion, not elsewhere classified: Secondary | ICD-10-CM

## 2017-05-31 DIAGNOSIS — Z5111 Encounter for antineoplastic chemotherapy: Secondary | ICD-10-CM

## 2017-05-31 DIAGNOSIS — R5383 Other fatigue: Secondary | ICD-10-CM

## 2017-05-31 DIAGNOSIS — C7931 Secondary malignant neoplasm of brain: Secondary | ICD-10-CM

## 2017-05-31 DIAGNOSIS — R110 Nausea: Secondary | ICD-10-CM | POA: Diagnosis not present

## 2017-05-31 DIAGNOSIS — I10 Essential (primary) hypertension: Secondary | ICD-10-CM

## 2017-05-31 DIAGNOSIS — H539 Unspecified visual disturbance: Secondary | ICD-10-CM

## 2017-05-31 LAB — COMPREHENSIVE METABOLIC PANEL
ALT: 27 U/L (ref 0–55)
AST: 21 U/L (ref 5–34)
Albumin: 3.8 g/dL (ref 3.5–5.0)
Alkaline Phosphatase: 84 U/L (ref 40–150)
Anion Gap: 10 mEq/L (ref 3–11)
BUN: 10.1 mg/dL (ref 7.0–26.0)
CO2: 26 mEq/L (ref 22–29)
Calcium: 9.9 mg/dL (ref 8.4–10.4)
Chloride: 103 mEq/L (ref 98–109)
Creatinine: 1.2 mg/dL (ref 0.7–1.3)
EGFR: 71 mL/min/{1.73_m2} — ABNORMAL LOW (ref >90)
Glucose: 132 mg/dl (ref 70–140)
Potassium: 3.7 mEq/L (ref 3.5–5.1)
Sodium: 139 mEq/L (ref 136–145)
Total Bilirubin: 0.39 mg/dL (ref 0.20–1.20)
Total Protein: 7.4 g/dL (ref 6.4–8.3)

## 2017-05-31 LAB — CBC WITH DIFFERENTIAL/PLATELET
BASO%: 0 % (ref 0.0–2.0)
Basophils Absolute: 0 10*3/uL (ref 0.0–0.1)
EOS%: 1.6 % (ref 0.0–7.0)
Eosinophils Absolute: 0 10*3/uL (ref 0.0–0.5)
HCT: 32.2 % — ABNORMAL LOW (ref 38.4–49.9)
HGB: 11 g/dL — ABNORMAL LOW (ref 13.0–17.1)
LYMPH%: 31.1 % (ref 14.0–49.0)
MCH: 40.6 pg — ABNORMAL HIGH (ref 27.2–33.4)
MCHC: 34.2 g/dL (ref 32.0–36.0)
MCV: 118.8 fL — ABNORMAL HIGH (ref 79.3–98.0)
MONO#: 0.4 10*3/uL (ref 0.1–0.9)
MONO%: 17.6 % — ABNORMAL HIGH (ref 0.0–14.0)
NEUT#: 1.2 10*3/uL — ABNORMAL LOW (ref 1.5–6.5)
NEUT%: 49.7 % (ref 39.0–75.0)
Platelets: 85 10*3/uL — ABNORMAL LOW (ref 140–400)
RBC: 2.71 10*6/uL — ABNORMAL LOW (ref 4.20–5.82)
RDW: 17 % — ABNORMAL HIGH (ref 11.0–14.6)
WBC: 2.4 10*3/uL — ABNORMAL LOW (ref 4.0–10.3)
lymph#: 0.8 10*3/uL — ABNORMAL LOW (ref 0.9–3.3)

## 2017-05-31 LAB — UA PROTEIN, DIPSTICK - CHCC: Protein, ur: NEGATIVE mg/dL

## 2017-05-31 MED ORDER — DEXAMETHASONE 4 MG PO TABS: ORAL_TABLET | ORAL | 1 refills | Status: DC

## 2017-05-31 NOTE — Progress Notes (Signed)
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  PRIOR THERAPY:  1) Stereotactic radiotherapy to a solitary brain metastasis under the care of Dr. Lisbeth Renshaw on 01/25/2017. 2) Short course of concurrent chemoradiation with weekly carboplatin for AUC of 2 and paclitaxel 45 MG/M2 to the locally advanced disease in the chest. Status post 3 cycles.  CURRENT THERAPY: Systemic chemotherapy with carboplatin for AUC of 5, Alimta 500 MG/M2 and Avastin 15 MG/KG every 3 weeks. First dose of 02/15/2017. Status post 5 cycles.   INTERVAL HISTORY: Calvin Wagner 51 y.o. male returns to the clinic today for follow-up visit accompanied by his wife. The patient is feeling fine today with no specific complaints except for fatigue. He also has some blurry vision recently. He has not seen his ophthalmologist for wide. His recovery from the last dose of chemotherapy was a little bit slower than before. It took longer time with nausea and vomiting. He denied having any chest pain, shortness of breath, cough or hemoptysis. He has no significant weight loss or night sweats. He has no fever or chills. He is here today for evaluation before starting cycle #6.  MEDICAL HISTORY: Past Medical History:  Diagnosis Date  . Encounter for antineoplastic chemotherapy 12/31/2016  . GERD 11/08/2009   Qualifier: Diagnosis of  By: Ronnald Ramp MD, Arvid Right Goals of care, counseling/discussion 12/31/2016  . History of radiation therapy 01/13/2017 - 02/02/2017   Site/dose:  Left lung: 30 Gy in 15 fractions  .  HYPERTENSION 11/08/2009   Qualifier: Diagnosis of  By: Ronnald Ramp MD, Arvid Right.   . Large cell carcinoma of left lung, stage 4 ([facility]) 12/31/2016  . Migraine 02/25/2012  . Pneumonia   . PONV (postoperative nausea and vomiting)   . Rotator cuff tear, left     ALLERGIES:  is allergic to no known allergies.  MEDICATIONS:  Current Outpatient Prescriptions  Medication Sig Dispense Refill  . acetaminophen-codeine (TYLENOL #4) 300-60 MG tablet Take 1 tablet by mouth every 4 (four) hours as needed.  0  . budesonide-formoterol (SYMBICORT) 80-4.5 MCG/ACT inhaler Inhale 2 puffs into the lungs 2 (two) times daily. 1 Inhaler 5  . chlorpheniramine-HYDROcodone (TUSSIONEX) 10-8 MG/5ML SUER Take 5 mLs by mouth every 12 (twelve) hours as needed for cough. 473 mL 0  . dexamethasone (DECADRON) 4 MG tablet 4 mg by mouth twice a day the day before, day of and day after the chemotherapy 40 tablet 1  . enoxaparin (LOVENOX) 150 MG/ML injection Inject 1 mL (150 mg total) into the skin daily. 30 Syringe 2  . folic acid (FOLVITE) 1 MG tablet Take 1 tablet (1 mg total) by mouth daily. 30 tablet 4  . LORazepam (ATIVAN) 0.5 MG tablet 1 tablet po q 4-6 hours prn anxiety and one po 30 minutes prior to radiation or MRI 30 tablet 0  . losartan (COZAAR) 25 MG tablet Take 25 mg by mouth daily.  1  . Multiple Vitamin (MULTIVITAMIN WITH MINERALS) TABS tablet Take 1 tablet by mouth daily.    . pantoprazole (PROTONIX) 40 MG tablet Take 40 mg by mouth  daily.  2  . prochlorperazine (COMPAZINE) 10 MG tablet Take 1 tablet (10 mg total) by mouth every 6 (six) hours as needed for nausea or vomiting. 30 tablet 0  . sucralfate (CARAFATE) 1 GM/10ML suspension Take 10 mLs (1 g total) by mouth 4 (four) times daily -  with meals and at bedtime. 420 mL 0  . traMADol (ULTRAM) 50 MG tablet Take 1 tablet (50 mg total) by mouth every 12 (twelve) hours as needed. 30 tablet 0   No current facility-administered medications for this visit.     SURGICAL  HISTORY:  Past Surgical History:  Procedure Laterality Date  . INGUINAL HERNIA REPAIR    . SHOULDER ARTHROSCOPY WITH ROTATOR CUFF REPAIR Left 11/12/2016   Procedure: SHOULDER ARTHROSCOPY WITH ROTATOR CUFF REPAIR AND SUBACROMIAL DECOMPRESSION;  Surgeon: Tania Ade, MD;  Location: [facility];  Service: Orthopedics;  Laterality: Left;  SHOULDER ARTHROSCOPY WITH ROTATOR CUFF REPAIR AND SUBACROMIAL DECOMPRESSION  . TONSILLECTOMY    . TOTAL KNEE ARTHROPLASTY    . VIDEO BRONCHOSCOPY Bilateral 12/10/2016   Procedure: VIDEO BRONCHOSCOPY WITHOUT FLUORO;  Surgeon: Tanda Rockers, MD;  Location: [facility];  Service: Cardiopulmonary;  Laterality: Bilateral;    REVIEW OF SYSTEMS:  A comprehensive review of systems was negative except for: Constitutional: positive for fatigue Eyes: positive for visual disturbance Gastrointestinal: positive for nausea   PHYSICAL EXAMINATION: General appearance: alert, cooperative, fatigued and no distress Head: Normocephalic, without obvious abnormality, atraumatic Neck: no adenopathy, no JVD, supple, symmetrical, trachea midline and thyroid not enlarged, symmetric, no tenderness/mass/nodules Lymph nodes: Cervical, supraclavicular, and axillary nodes normal. Resp: clear to auscultation bilaterally Back: symmetric, no curvature. ROM normal. No CVA tenderness. Cardio: regular rate and rhythm, S1, S2 normal, no murmur, click, rub or gallop GI: soft, non-tender; bowel sounds normal; no masses,  no organomegaly Extremities: extremities normal, atraumatic, no cyanosis or edema  ECOG PERFORMANCE STATUS: 1 - Symptomatic but completely ambulatory  Blood pressure (!) 143/98, pulse (!) 106, temperature 98.7 F (37.1 C), temperature source Oral, resp. rate 20, height 6\' 1"  (1.854 m), weight 226 lb 14.4 oz (102.9 kg), SpO2 100 %.  LABORATORY DATA: Lab Results  Component Value Date   WBC 2.4 (L) 05/31/2017   HGB 11.0 (L) 05/31/2017   HCT 32.2 (L) 05/31/2017   MCV 118.8 (H)  05/31/2017   PLT 85 (L) 05/31/2017      Chemistry      Component Value Date/Time   NA 141 05/25/2017 1252   K 4.2 05/25/2017 1252   CL 103 11/26/2016 0438   CO2 27 05/25/2017 1252   BUN 9.8 05/25/2017 1252   CREATININE 1.1 05/25/2017 1252      Component Value Date/Time   CALCIUM 9.7 05/25/2017 1252   ALKPHOS 77 05/25/2017 1252   AST 17 05/25/2017 1252   ALT 22 05/25/2017 1252   BILITOT 0.25 05/25/2017 1252       RADIOGRAPHIC STUDIES: Ct Chest W Contrast  Result Date: 05/06/2017 CLINICAL DATA:  Lung cancer metastatic to brain and liver. Prior radiation therapy. Chronic cough. Ongoing chemotherapy. Nausea. EXAM: CT CHEST, ABDOMEN, AND PELVIS WITH CONTRAST TECHNIQUE: Multidetector CT imaging of the chest, abdomen and pelvis was performed following the standard protocol during bolus administration of intravenous contrast. CONTRAST:  175mL ISOVUE-300 IOPAMIDOL (ISOVUE-300) INJECTION 61% COMPARISON:  Multiple exams, including 12/17/2016 PET-CT and CT examinations from February 2018. FINDINGS: CT CHEST FINDINGS Cardiovascular: Unremarkable Mediastinum/Nodes: Left paraesophageal lymph node 0.7 cm in short axis on image 37/2, previously 1.4 cm  on 12/17/2016. Left infrahilar lymph node 0.9 cm in short axis on image 39/2, formerly 2.3 cm on 12/17/2016. Lungs/Pleura: 0.9 by 0.7 cm ground-glass opacity posteriorly in the left upper lobe on image 70/6, new compared the prior exam. Faint left upper lobe perihilar ground-glass opacities but the more confluent left perihilar nodular and airspace opacities have resolved. In the left lower lobe there is some residual bronchial occlusion the lateral basal segmental and posterior basal segmental bronchi for example on image 98/6, although the degree of nodularity in this region is less. There is some cylindrical bronchiectasis laterally in the left lower lobe, and considerable airway plugging medially in the left lower lobe (particularly the B10B and B10C  bronchi). In addition to this airway plugging there may be some peribronchovascular nodularity in the left lower lobe. Peripherally in the left lower lobe, a subpleural 0.8 by 0.8 cm nodule is present on image 142/6 (formerly 1.1 by 0.9 cm). Musculoskeletal: Bilateral mesoacromial os acromiale. Mild levoconvex upper thoracic scoliosis. CT ABDOMEN PELVIS FINDINGS Hepatobiliary: The previous segment 5 lesion shown on PET-CT is not readily seen on today's exam. Gallbladder unremarkable. Pancreas: Unremarkable Spleen: Unremarkable Adrenals/Urinary Tract: 0.9 cm exophytic lesion of the right kidney lower pole appear stable and is likely a small cyst. Adrenal glands normal. Stomach/Bowel: Unremarkable Vascular/Lymphatic: Minimal abdominal aortic atherosclerotic calcification. No adenopathy in the abdomen. Reproductive: Unremarkable Other: Unremarkable Musculoskeletal: Hernia mesh is noted along the anterior pelvis inferiorly. Small umbilical hernia contains adipose tissue. Lower lumbar spondylosis and degenerative disc disease at L5-S1. Considerable facet arthropathy with spurring at L3-4. Degenerative disc disease at L3-4 and L4-5, at L3-4 there is right foraminal stenosis due to disc bulge and facet arthropathy. IMPRESSION: 1. Reduced size of the left infrahilar and paraesophageal adenopathy and reduced size of the left lower lobe peribronchovascular tumor/nodularity. There continues to be proximal occlusion of the lateral basal segmental and posterior basal segmental bronchi, with mucus or tumor plugging of the airways medially in the left lower lobe. Modestly reduced size of the left lower lobe pulmonary nodule. However, overall the intrathoracic component of malignancy is considerably reduced. 2. The previously hypermetabolic liver lesion in segment 5 is no longer readily apparent by CT. 3. No new metastatic disease is identified. 4. Other imaging findings of potential clinical significance: Aortic Atherosclerosis  (ICD10-I70.0). Lumbar spondylosis and degenerative disc disease causing impingement at several levels. Electronically Signed   By: Van Clines M.D.   On: 05/06/2017 15:24   Mr Jeri Cos IO Contrast  Result Date: 05/04/2017 CLINICAL DATA:  Followup metastatic lung cancer. EXAM: MRI HEAD WITHOUT AND WITH CONTRAST TECHNIQUE: Multiplanar, multiecho pulse sequences of the brain and surrounding structures were obtained without and with intravenous contrast. CONTRAST:  20mL MULTIHANCE GADOBENATE DIMEGLUMINE 529 MG/ML IV SOLN COMPARISON:  01/16/2017.  01/02/2017. FINDINGS: Brain: Previously seen 13 mm ring-enhancing metastasis in the inferior frontal lobe on the left is now visible only as a 3 mm nonenhancing cyst. There is no new lesion. The brain otherwise appears normal. No hydrocephalus or extra-axial collection. Vascular: Major vessels at the base of the brain show flow. Skull and upper cervical spine: Negative Sinuses/Orbits: No significant sinus inflammatory disease. Orbits negative. Other: None IMPRESSION: Previously seen necrotic 13 mm inferior frontal metastasis on the left is now visible only as a 3 mm nonenhancing cyst. The remainder the brain is negative. Electronically Signed   By: Nelson Chimes M.D.   On: 05/04/2017 15:20   Ct Abdomen Pelvis W Contrast  Result Date:  05/06/2017 CLINICAL DATA:  Lung cancer metastatic to brain and liver. Prior radiation therapy. Chronic cough. Ongoing chemotherapy. Nausea. EXAM: CT CHEST, ABDOMEN, AND PELVIS WITH CONTRAST TECHNIQUE: Multidetector CT imaging of the chest, abdomen and pelvis was performed following the standard protocol during bolus administration of intravenous contrast. CONTRAST:  151mL ISOVUE-300 IOPAMIDOL (ISOVUE-300) INJECTION 61% COMPARISON:  Multiple exams, including 12/17/2016 PET-CT and CT examinations from February 2018. FINDINGS: CT CHEST FINDINGS Cardiovascular: Unremarkable Mediastinum/Nodes: Left paraesophageal lymph node 0.7 cm in short  axis on image 37/2, previously 1.4 cm on 12/17/2016. Left infrahilar lymph node 0.9 cm in short axis on image 39/2, formerly 2.3 cm on 12/17/2016. Lungs/Pleura: 0.9 by 0.7 cm ground-glass opacity posteriorly in the left upper lobe on image 70/6, new compared the prior exam. Faint left upper lobe perihilar ground-glass opacities but the more confluent left perihilar nodular and airspace opacities have resolved. In the left lower lobe there is some residual bronchial occlusion the lateral basal segmental and posterior basal segmental bronchi for example on image 98/6, although the degree of nodularity in this region is less. There is some cylindrical bronchiectasis laterally in the left lower lobe, and considerable airway plugging medially in the left lower lobe (particularly the B10B and B10C bronchi). In addition to this airway plugging there may be some peribronchovascular nodularity in the left lower lobe. Peripherally in the left lower lobe, a subpleural 0.8 by 0.8 cm nodule is present on image 142/6 (formerly 1.1 by 0.9 cm). Musculoskeletal: Bilateral mesoacromial os acromiale. Mild levoconvex upper thoracic scoliosis. CT ABDOMEN PELVIS FINDINGS Hepatobiliary: The previous segment 5 lesion shown on PET-CT is not readily seen on today's exam. Gallbladder unremarkable. Pancreas: Unremarkable Spleen: Unremarkable Adrenals/Urinary Tract: 0.9 cm exophytic lesion of the right kidney lower pole appear stable and is likely a small cyst. Adrenal glands normal. Stomach/Bowel: Unremarkable Vascular/Lymphatic: Minimal abdominal aortic atherosclerotic calcification. No adenopathy in the abdomen. Reproductive: Unremarkable Other: Unremarkable Musculoskeletal: Hernia mesh is noted along the anterior pelvis inferiorly. Small umbilical hernia contains adipose tissue. Lower lumbar spondylosis and degenerative disc disease at L5-S1. Considerable facet arthropathy with spurring at L3-4. Degenerative disc disease at L3-4 and L4-5,  at L3-4 there is right foraminal stenosis due to disc bulge and facet arthropathy. IMPRESSION: 1. Reduced size of the left infrahilar and paraesophageal adenopathy and reduced size of the left lower lobe peribronchovascular tumor/nodularity. There continues to be proximal occlusion of the lateral basal segmental and posterior basal segmental bronchi, with mucus or tumor plugging of the airways medially in the left lower lobe. Modestly reduced size of the left lower lobe pulmonary nodule. However, overall the intrathoracic component of malignancy is considerably reduced. 2. The previously hypermetabolic liver lesion in segment 5 is no longer readily apparent by CT. 3. No new metastatic disease is identified. 4. Other imaging findings of potential clinical significance: Aortic Atherosclerosis (ICD10-I70.0). Lumbar spondylosis and degenerative disc disease causing impingement at several levels. Electronically Signed   By: Van Clines M.D.   On: 05/06/2017 15:24    ASSESSMENT AND PLAN: This is a very pleasant 51 years old white male with stage IV non-small cell lung cancer, high-grade neuroendocrine carcinoma presented with locally advanced disease in the chest as well as solitary brain metastasis and 2 liver lesions. The patient is status post stereotactic radiotherapy to the solitary brain metastasis as well as short course of concurrent chemoradiation to the locally advanced disease in the chest. He is currently undergoing systemic chemotherapy with carboplatin, Alimta and Avastin status post 5  cycles. He tolerated the last cycle well except for around fatigue and nausea. He also has some visual changes recently. His platelets count are low today. I will delay the start of cycle #6 by 1 week. I will see the patient back for follow-up visit in one month's after repeating CT scan of the chest, abdomen and pelvis for restaging of his disease. For the pleural effusion, I advised the patient to see his  ophthalmologist for evaluation. His last MRI of the brain was unremarkable for any concerning findings. The patient was advised to call immediately if he has any concerning symptoms in the interval. The patient voices understanding of current disease status and treatment options and is in agreement with the current care plan. All questions were answered. The patient knows to call the clinic with any problems, questions or concerns. We can certainly see the patient much sooner if necessary.  Disclaimer: This note was dictated with voice recognition software. Similar sounding words can inadvertently be transcribed and may not be corrected upon review.

## 2017-05-31 NOTE — Telephone Encounter (Signed)
Scheduled appt per 8/20 los- left message with appt date and time- sent reminder letter in the mail.

## 2017-06-03 ENCOUNTER — Telehealth: Payer: Self-pay | Admitting: Internal Medicine

## 2017-06-03 NOTE — Telephone Encounter (Signed)
Patient spouse called and moved 8/29 lab/tx to 8/28. No availability to add to 8/27. Spouse has new date/time.

## 2017-06-07 ENCOUNTER — Other Ambulatory Visit: Payer: Self-pay | Admitting: Medical Oncology

## 2017-06-07 ENCOUNTER — Other Ambulatory Visit: Payer: Self-pay | Admitting: Internal Medicine

## 2017-06-07 DIAGNOSIS — I2699 Other pulmonary embolism without acute cor pulmonale: Secondary | ICD-10-CM

## 2017-06-07 MED ORDER — ENOXAPARIN SODIUM 150 MG/ML ~~LOC~~ SOLN: 150.0000 mg | SUBCUTANEOUS | 2 refills | Status: DC

## 2017-06-08 ENCOUNTER — Ambulatory Visit: Payer: 59

## 2017-06-08 ENCOUNTER — Other Ambulatory Visit: Payer: 59

## 2017-06-09 ENCOUNTER — Ambulatory Visit: Payer: 59

## 2017-06-09 ENCOUNTER — Other Ambulatory Visit: Payer: 59

## 2017-06-10 ENCOUNTER — Ambulatory Visit (HOSPITAL_BASED_OUTPATIENT_CLINIC_OR_DEPARTMENT_OTHER): Payer: 59

## 2017-06-10 ENCOUNTER — Other Ambulatory Visit (HOSPITAL_BASED_OUTPATIENT_CLINIC_OR_DEPARTMENT_OTHER): Payer: 59

## 2017-06-10 VITALS — BP 129/91 | HR 114 | Temp 98.6°F | Resp 20

## 2017-06-10 DIAGNOSIS — Z5111 Encounter for antineoplastic chemotherapy: Secondary | ICD-10-CM

## 2017-06-10 DIAGNOSIS — C3492 Malignant neoplasm of unspecified part of left bronchus or lung: Secondary | ICD-10-CM

## 2017-06-10 DIAGNOSIS — C7A1 Malignant poorly differentiated neuroendocrine tumors: Secondary | ICD-10-CM | POA: Diagnosis not present

## 2017-06-10 DIAGNOSIS — Z5112 Encounter for antineoplastic immunotherapy: Secondary | ICD-10-CM

## 2017-06-10 DIAGNOSIS — C7B8 Other secondary neuroendocrine tumors: Secondary | ICD-10-CM

## 2017-06-10 LAB — CBC WITH DIFFERENTIAL/PLATELET
BASO%: 0.5 % (ref 0.0–2.0)
Basophils Absolute: 0 10*3/uL (ref 0.0–0.1)
EOS%: 1.3 % (ref 0.0–7.0)
Eosinophils Absolute: 0 10*3/uL (ref 0.0–0.5)
HCT: 35.2 % — ABNORMAL LOW (ref 38.4–49.9)
HGB: 12.4 g/dL — ABNORMAL LOW (ref 13.0–17.1)
LYMPH%: 22.9 % (ref 14.0–49.0)
MCH: 41.8 pg — ABNORMAL HIGH (ref 27.2–33.4)
MCHC: 35.1 g/dL (ref 32.0–36.0)
MCV: 119.1 fL — ABNORMAL HIGH (ref 79.3–98.0)
MONO#: 0.4 10*3/uL (ref 0.1–0.9)
MONO%: 12.3 % (ref 0.0–14.0)
NEUT#: 1.9 10*3/uL (ref 1.5–6.5)
NEUT%: 63 % (ref 39.0–75.0)
Platelets: 185 10*3/uL (ref 140–400)
RBC: 2.96 10*6/uL — ABNORMAL LOW (ref 4.20–5.82)
RDW: 16.8 % — ABNORMAL HIGH (ref 11.0–14.6)
WBC: 3 10*3/uL — ABNORMAL LOW (ref 4.0–10.3)
lymph#: 0.7 10*3/uL — ABNORMAL LOW (ref 0.9–3.3)

## 2017-06-10 LAB — COMPREHENSIVE METABOLIC PANEL
ALT: 23 U/L (ref 0–55)
AST: 20 U/L (ref 5–34)
Albumin: 3.8 g/dL (ref 3.5–5.0)
Alkaline Phosphatase: 79 U/L (ref 40–150)
Anion Gap: 11 mEq/L (ref 3–11)
BUN: 12.5 mg/dL (ref 7.0–26.0)
CO2: 25 mEq/L (ref 22–29)
Calcium: 9.9 mg/dL (ref 8.4–10.4)
Chloride: 105 mEq/L (ref 98–109)
Creatinine: 1.1 mg/dL (ref 0.7–1.3)
EGFR: 78 mL/min/{1.73_m2} — ABNORMAL LOW (ref >90)
Glucose: 122 mg/dl (ref 70–140)
Potassium: 4.2 mEq/L (ref 3.5–5.1)
Sodium: 141 mEq/L (ref 136–145)
Total Bilirubin: 0.38 mg/dL (ref 0.20–1.20)
Total Protein: 7.5 g/dL (ref 6.4–8.3)

## 2017-06-10 MED ORDER — PALONOSETRON HCL INJECTION 0.25 MG/5ML
0.2500 mg | Freq: Once | INTRAVENOUS | Status: AC
  Administered 2017-06-10: 0.25 mg via INTRAVENOUS

## 2017-06-10 MED ORDER — SODIUM CHLORIDE 0.9 % IV SOLN
Freq: Once | INTRAVENOUS | Status: AC
  Administered 2017-06-10: 13:00:00 via INTRAVENOUS

## 2017-06-10 MED ORDER — SODIUM CHLORIDE 0.9 % IV SOLN
657.5000 mg | Freq: Once | INTRAVENOUS | Status: AC
  Administered 2017-06-10: 660 mg via INTRAVENOUS
  Filled 2017-06-10: qty 66

## 2017-06-10 MED ORDER — CYANOCOBALAMIN 1000 MCG/ML IJ SOLN
INTRAMUSCULAR | Status: AC
  Filled 2017-06-10: qty 1

## 2017-06-10 MED ORDER — SODIUM CHLORIDE 0.9 % IV SOLN
520.0000 mg/m2 | Freq: Once | INTRAVENOUS | Status: AC
  Administered 2017-06-10: 1200 mg via INTRAVENOUS
  Filled 2017-06-10: qty 40

## 2017-06-10 MED ORDER — CARBOPLATIN CHEMO INTRADERMAL TEST DOSE 100MCG/0.02ML
100.0000 ug | Freq: Once | INTRADERMAL | Status: AC
  Administered 2017-06-10: 100 ug via INTRADERMAL
  Filled 2017-06-10: qty 0.02

## 2017-06-10 MED ORDER — SODIUM CHLORIDE 0.9 % IV SOLN
Freq: Once | INTRAVENOUS | Status: AC
  Administered 2017-06-10: 14:00:00 via INTRAVENOUS
  Filled 2017-06-10: qty 5

## 2017-06-10 MED ORDER — CYANOCOBALAMIN 1000 MCG/ML IJ SOLN
1000.0000 ug | Freq: Once | INTRAMUSCULAR | Status: AC
  Administered 2017-06-10: 1000 ug via INTRAMUSCULAR

## 2017-06-10 MED ORDER — SODIUM CHLORIDE 0.9 % IV SOLN
15.6000 mg/kg | Freq: Once | INTRAVENOUS | Status: AC
  Administered 2017-06-10: 1600 mg via INTRAVENOUS
  Filled 2017-06-10: qty 64

## 2017-06-10 MED ORDER — PALONOSETRON HCL INJECTION 0.25 MG/5ML
INTRAVENOUS | Status: AC
  Filled 2017-06-10: qty 5

## 2017-06-10 NOTE — Patient Instructions (Signed)
[facility] Wagner Discharge Instructions for Patients Receiving Chemotherapy  Today you received the following chemotherapy agents bevacizumab (Avastin), pemtrexed (Alimta), and Carboplatin.  To help prevent nausea and vomiting after your treatment, we encourage you to take your nausea medication as prescribed.   If you develop nausea and vomiting that is not controlled by your nausea medication, call the clinic.   BELOW ARE SYMPTOMS THAT SHOULD BE REPORTED IMMEDIATELY:  *FEVER GREATER THAN 100.5 F  *CHILLS WITH OR WITHOUT FEVER  NAUSEA AND VOMITING THAT IS NOT CONTROLLED WITH YOUR NAUSEA MEDICATION  *UNUSUAL SHORTNESS OF BREATH  *UNUSUAL BRUISING OR BLEEDING  TENDERNESS IN MOUTH AND THROAT WITH OR WITHOUT PRESENCE OF ULCERS  *URINARY PROBLEMS  *BOWEL PROBLEMS  UNUSUAL RASH Items with * indicate a potential emergency and should be followed up as soon as possible.  Feel free to call the clinic you have any questions or concerns. The clinic phone number is [phone].  Please show the [facility] at check-in to the Emergency Department and triage nurse.

## 2017-06-15 ENCOUNTER — Telehealth: Payer: Self-pay | Admitting: *Deleted

## 2017-06-15 NOTE — Telephone Encounter (Signed)
"  I need to talk with Dr. Worthy Flank nurse to discuss my husband Calvin Wagner scheduling of scans.  Call me at [phone] and let me know if there's somewhere I should call.  Secondly, is he to com in for blood worksince his last treatment was Thursday."    Managed Care lead reports patients may call [facility] Schedule to proceed with scheduling test while authorizations are being processed.  Received voicemail with return call.  Message left to call [phone] for scheduling CT C/A/P.  Lab only scheduled 9:30 am 06-16-2017, 06-26-2017 with MD F/U 07-06-2017 at 9:00 am.

## 2017-06-16 ENCOUNTER — Other Ambulatory Visit (HOSPITAL_BASED_OUTPATIENT_CLINIC_OR_DEPARTMENT_OTHER): Payer: 59

## 2017-06-16 DIAGNOSIS — C3492 Malignant neoplasm of unspecified part of left bronchus or lung: Secondary | ICD-10-CM

## 2017-06-16 DIAGNOSIS — C7A1 Malignant poorly differentiated neuroendocrine tumors: Secondary | ICD-10-CM | POA: Diagnosis not present

## 2017-06-16 LAB — COMPREHENSIVE METABOLIC PANEL
ALT: 49 U/L (ref 0–55)
AST: 25 U/L (ref 5–34)
Albumin: 3.9 g/dL (ref 3.5–5.0)
Alkaline Phosphatase: 69 U/L (ref 40–150)
Anion Gap: 10 mEq/L (ref 3–11)
BUN: 14.3 mg/dL (ref 7.0–26.0)
CO2: 27 mEq/L (ref 22–29)
Calcium: 10.1 mg/dL (ref 8.4–10.4)
Chloride: 101 mEq/L (ref 98–109)
Creatinine: 1.1 mg/dL (ref 0.7–1.3)
EGFR: 74 mL/min/{1.73_m2} — ABNORMAL LOW (ref >90)
Glucose: 98 mg/dl (ref 70–140)
Potassium: 4.6 mEq/L (ref 3.5–5.1)
Sodium: 138 mEq/L (ref 136–145)
Total Bilirubin: 0.39 mg/dL (ref 0.20–1.20)
Total Protein: 7.6 g/dL (ref 6.4–8.3)

## 2017-06-16 LAB — CBC WITH DIFFERENTIAL/PLATELET
BASO%: 0.5 % (ref 0.0–2.0)
Basophils Absolute: 0 10*3/uL (ref 0.0–0.1)
EOS%: 1.9 % (ref 0.0–7.0)
Eosinophils Absolute: 0 10*3/uL (ref 0.0–0.5)
HCT: 35 % — ABNORMAL LOW (ref 38.4–49.9)
HGB: 12 g/dL — ABNORMAL LOW (ref 13.0–17.1)
LYMPH%: 37.4 % (ref 14.0–49.0)
MCH: 40.1 pg — ABNORMAL HIGH (ref 27.2–33.4)
MCHC: 34.3 g/dL (ref 32.0–36.0)
MCV: 117.1 fL — ABNORMAL HIGH (ref 79.3–98.0)
MONO#: 0.2 10*3/uL (ref 0.1–0.9)
MONO%: 11.7 % (ref 0.0–14.0)
NEUT#: 1 10*3/uL — ABNORMAL LOW (ref 1.5–6.5)
NEUT%: 48.5 % (ref 39.0–75.0)
Platelets: 144 10*3/uL (ref 140–400)
RBC: 2.99 10*6/uL — ABNORMAL LOW (ref 4.20–5.82)
RDW: 14.2 % (ref 11.0–14.6)
WBC: 2.1 10*3/uL — ABNORMAL LOW (ref 4.0–10.3)
lymph#: 0.8 10*3/uL — ABNORMAL LOW (ref 0.9–3.3)

## 2017-06-16 LAB — UA PROTEIN, DIPSTICK - CHCC: Protein, ur: <30 mg/dL

## 2017-06-21 ENCOUNTER — Other Ambulatory Visit: Payer: Self-pay | Admitting: Internal Medicine

## 2017-06-21 DIAGNOSIS — C3492 Malignant neoplasm of unspecified part of left bronchus or lung: Secondary | ICD-10-CM

## 2017-06-21 DIAGNOSIS — R112 Nausea with vomiting, unspecified: Secondary | ICD-10-CM

## 2017-06-23 ENCOUNTER — Other Ambulatory Visit (HOSPITAL_BASED_OUTPATIENT_CLINIC_OR_DEPARTMENT_OTHER): Payer: 59

## 2017-06-23 DIAGNOSIS — C7A1 Malignant poorly differentiated neuroendocrine tumors: Secondary | ICD-10-CM | POA: Diagnosis not present

## 2017-06-23 DIAGNOSIS — C3492 Malignant neoplasm of unspecified part of left bronchus or lung: Secondary | ICD-10-CM

## 2017-06-23 LAB — COMPREHENSIVE METABOLIC PANEL
ALT: 19 U/L (ref 0–55)
AST: 14 U/L (ref 5–34)
Albumin: 3.7 g/dL (ref 3.5–5.0)
Alkaline Phosphatase: 79 U/L (ref 40–150)
Anion Gap: 9 mEq/L (ref 3–11)
BUN: 9.5 mg/dL (ref 7.0–26.0)
CO2: 25 mEq/L (ref 22–29)
Calcium: 9.7 mg/dL (ref 8.4–10.4)
Chloride: 105 mEq/L (ref 98–109)
Creatinine: 1.1 mg/dL (ref 0.7–1.3)
EGFR: 81 mL/min/{1.73_m2} — ABNORMAL LOW (ref >90)
Glucose: 98 mg/dl (ref 70–140)
Potassium: 4.6 mEq/L (ref 3.5–5.1)
Sodium: 139 mEq/L (ref 136–145)
Total Bilirubin: 0.3 mg/dL (ref 0.20–1.20)
Total Protein: 7.2 g/dL (ref 6.4–8.3)

## 2017-06-23 LAB — CBC WITH DIFFERENTIAL/PLATELET
BASO%: 0 % (ref 0.0–2.0)
Basophils Absolute: 0 10*3/uL (ref 0.0–0.1)
EOS%: 1.9 % (ref 0.0–7.0)
Eosinophils Absolute: 0 10*3/uL (ref 0.0–0.5)
HCT: 32.7 % — ABNORMAL LOW (ref 38.4–49.9)
HGB: 11.1 g/dL — ABNORMAL LOW (ref 13.0–17.1)
LYMPH%: 31.4 % (ref 14.0–49.0)
MCH: 39.9 pg — ABNORMAL HIGH (ref 27.2–33.4)
MCHC: 33.9 g/dL (ref 32.0–36.0)
MCV: 117.6 fL — ABNORMAL HIGH (ref 79.3–98.0)
MONO#: 0.5 10*3/uL (ref 0.1–0.9)
MONO%: 25.1 % — ABNORMAL HIGH (ref 0.0–14.0)
NEUT#: 0.9 10*3/uL — ABNORMAL LOW (ref 1.5–6.5)
NEUT%: 41.6 % (ref 39.0–75.0)
Platelets: 83 10*3/uL — ABNORMAL LOW (ref 140–400)
RBC: 2.78 10*6/uL — ABNORMAL LOW (ref 4.20–5.82)
RDW: 14.5 % (ref 11.0–14.6)
WBC: 2.1 10*3/uL — ABNORMAL LOW (ref 4.0–10.3)
lymph#: 0.7 10*3/uL — ABNORMAL LOW (ref 0.9–3.3)

## 2017-07-01 ENCOUNTER — Other Ambulatory Visit: Payer: Self-pay | Admitting: Internal Medicine

## 2017-07-01 DIAGNOSIS — C3492 Malignant neoplasm of unspecified part of left bronchus or lung: Secondary | ICD-10-CM

## 2017-07-02 ENCOUNTER — Ambulatory Visit (HOSPITAL_COMMUNITY)
Admission: RE | Admit: 2017-07-02 | Discharge: 2017-07-02 | Disposition: A | Discharge status: Home / Self Care | Payer: 59 | Source: Ambulatory Visit | Attending: Internal Medicine | Admitting: Internal Medicine

## 2017-07-02 DIAGNOSIS — C3492 Malignant neoplasm of unspecified part of left bronchus or lung: Secondary | ICD-10-CM | POA: Diagnosis not present

## 2017-07-02 DIAGNOSIS — M5137 Other intervertebral disc degeneration, lumbosacral region: Secondary | ICD-10-CM | POA: Insufficient documentation

## 2017-07-02 DIAGNOSIS — C3490 Malignant neoplasm of unspecified part of unspecified bronchus or lung: Secondary | ICD-10-CM | POA: Diagnosis not present

## 2017-07-02 DIAGNOSIS — I700 Atherosclerosis of aorta: Secondary | ICD-10-CM | POA: Insufficient documentation

## 2017-07-02 DIAGNOSIS — K769 Liver disease, unspecified: Secondary | ICD-10-CM | POA: Insufficient documentation

## 2017-07-02 DIAGNOSIS — J479 Bronchiectasis, uncomplicated: Secondary | ICD-10-CM | POA: Diagnosis not present

## 2017-07-02 DIAGNOSIS — I10 Essential (primary) hypertension: Secondary | ICD-10-CM | POA: Diagnosis not present

## 2017-07-02 DIAGNOSIS — C7931 Secondary malignant neoplasm of brain: Secondary | ICD-10-CM | POA: Insufficient documentation

## 2017-07-02 DIAGNOSIS — Z5111 Encounter for antineoplastic chemotherapy: Secondary | ICD-10-CM | POA: Diagnosis not present

## 2017-07-02 DIAGNOSIS — R918 Other nonspecific abnormal finding of lung field: Secondary | ICD-10-CM | POA: Diagnosis not present

## 2017-07-02 DIAGNOSIS — H539 Unspecified visual disturbance: Secondary | ICD-10-CM

## 2017-07-02 MED ORDER — IOPAMIDOL (ISOVUE-300) INJECTION 61%
100.0000 mL | Freq: Once | INTRAVENOUS | Status: AC | PRN
  Administered 2017-07-02: 100 mL via INTRAVENOUS

## 2017-07-02 MED ORDER — IOPAMIDOL (ISOVUE-300) INJECTION 61%
INTRAVENOUS | Status: AC
  Filled 2017-07-02: qty 100

## 2017-07-05 ENCOUNTER — Other Ambulatory Visit: Payer: Self-pay | Admitting: Internal Medicine

## 2017-07-05 DIAGNOSIS — C3492 Malignant neoplasm of unspecified part of left bronchus or lung: Secondary | ICD-10-CM

## 2017-07-06 ENCOUNTER — Telehealth: Payer: Self-pay | Admitting: Oncology

## 2017-07-06 ENCOUNTER — Ambulatory Visit (HOSPITAL_BASED_OUTPATIENT_CLINIC_OR_DEPARTMENT_OTHER): Payer: 59 | Admitting: Internal Medicine

## 2017-07-06 ENCOUNTER — Ambulatory Visit: Payer: 59

## 2017-07-06 ENCOUNTER — Other Ambulatory Visit: Payer: Self-pay | Admitting: Medical Oncology

## 2017-07-06 ENCOUNTER — Encounter: Payer: Self-pay | Admitting: Internal Medicine

## 2017-07-06 DIAGNOSIS — I2699 Other pulmonary embolism without acute cor pulmonale: Secondary | ICD-10-CM

## 2017-07-06 DIAGNOSIS — Z5111 Encounter for antineoplastic chemotherapy: Secondary | ICD-10-CM

## 2017-07-06 DIAGNOSIS — C7A1 Malignant poorly differentiated neuroendocrine tumors: Secondary | ICD-10-CM | POA: Diagnosis not present

## 2017-07-06 DIAGNOSIS — C3492 Malignant neoplasm of unspecified part of left bronchus or lung: Secondary | ICD-10-CM

## 2017-07-06 DIAGNOSIS — C7B8 Other secondary neuroendocrine tumors: Secondary | ICD-10-CM

## 2017-07-06 DIAGNOSIS — R5383 Other fatigue: Secondary | ICD-10-CM

## 2017-07-06 MED ORDER — ENOXAPARIN SODIUM 150 MG/ML ~~LOC~~ SOLN: 150.0000 mg | SUBCUTANEOUS | 2 refills | Status: DC

## 2017-07-06 MED ORDER — TRAMADOL HCL 50 MG PO TABS: 50.0000 mg | ORAL_TABLET | Freq: Two times a day (BID) | ORAL | 0 refills | Status: DC | PRN

## 2017-07-06 MED ORDER — LORAZEPAM 0.5 MG PO TABS: ORAL_TABLET | ORAL | 0 refills | Status: DC

## 2017-07-06 MED ORDER — FOLIC ACID 1 MG PO TABS: 1.0000 mg | ORAL_TABLET | Freq: Every day | ORAL | 4 refills | Status: AC

## 2017-07-06 NOTE — Progress Notes (Signed)
DISCONTINUE ON PATHWAY REGIMEN - Non-Small Cell Lung     A cycle is every 21 days:     Carboplatin      Pemetrexed      Bevacizumab   **Always confirm dose/schedule in your pharmacy ordering system**    REASON: Other Reason PRIOR TREATMENT: [id number]: Carboplatin AUC=5 + Pemetrexed 500 mg/m2 + Bevacizumab 15 mg/kg q21 Days x 4 Cycles TREATMENT RESPONSE: Stable Disease (SD)  START ON PATHWAY REGIMEN - Non-Small Cell Lung     A cycle is every 21 days:     Pemetrexed   **Always confirm dose/schedule in your pharmacy ordering system**    Patient Characteristics: Stage IV Metastatic, Nonsquamous, Maintenance - Chemotherapy/Immunotherapy, PS = 0, 1, Initial Paclitaxel + Carboplatin or Initial Pemetrexed + Platinum Agent AJCC T Category: T1b Current Disease Status: Distant Metastases AJCC N Category: N2 AJCC M Category: M1c AJCC 8 Stage Grouping: IVB Histology: Nonsquamous Cell ROS1 Rearrangement Status: Negative T790M Mutation Status: Not Applicable - EGFR Mutation Negative/Unknown Other Mutations/Biomarkers: No Other Actionable Mutations PD-L1 Expression Status: Did Not Order Test Chemotherapy/Immunotherapy LOT: Maintenance Chemotherapy/Immunotherapy Molecular Targeted Therapy: Not Appropriate ALK Translocation Status: Negative Would you be surprised if this patient died  in the next year<= I would NOT be surprised if this patient died in the next year EGFR Mutation Status: Negative/Wild Type BRAF [id number] Mutation Status: Negative Performance Status: PS = 0, 1 Intent of Therapy: Non-Curative / Palliative Intent, Discussed with Patient

## 2017-07-06 NOTE — Progress Notes (Signed)
[place] [phone]   [phone]  OFFICE PROGRESS NOTE  [name], MD 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  PRIOR THERAPY:  1) Stereotactic radiotherapy to a solitary brain metastasis under the care of Dr. Lisbeth Renshaw on 01/25/2017. 2) Short course of concurrent chemoradiation with weekly carboplatin for AUC of 2 and paclitaxel 45 MG/M2 to the locally advanced disease in the chest. Status post 3 cycles. 3) Systemic chemotherapy with carboplatin for AUC of 5, Alimta 500 MG/M2 and Avastin 15 MG/KG every 3 weeks. First dose of 02/15/2017. Status post 6 cycles. Last dose 06/08/2017 with stable disease.   CURRENT THERAPY: Maintenance systemic chemotherapy with Alimta 500 MG/M2 in addition to Avastin 15 MG/KG every 3 weeks. First dose 07/13/2017.  INTERVAL HISTORY: Calvin Wagner 51 y.o. male returns to the clinic today for follow-up visit accompanied by his wife. The patient is feeling very well today except for fatigue. He denied having any chest pain, shortness breath, cough or hemoptysis. He denied having any fever or chills. He has intermittent abdominal pain but no significant nausea, vomiting, diarrhea or constipation. He tolerated the last cycle of his treatment fairly well with no significant adverse effects. The patient had repeat CT scan of the chest, abdomen and pelvis performed recently and he is here for evaluation and discussion of his scan results and treatment options.  MEDICAL HISTORY: Past Medical History:  Diagnosis Date  . Encounter for antineoplastic chemotherapy 12/31/2016  . GERD 11/08/2009   Qualifier:  Diagnosis of  By: Ronnald Ramp MD, Arvid Right Goals of care, counseling/discussion 12/31/2016  . History of radiation therapy 01/13/2017 - 02/02/2017   Site/dose:  Left lung: 30 Gy in 15 fractions  . HYPERTENSION 11/08/2009   Qualifier: Diagnosis of  By: Ronnald Ramp MD, Arvid Right.   . Large cell carcinoma of left lung, stage 4 ([facility]) 12/31/2016  . Migraine 02/25/2012  . Pneumonia   . PONV (postoperative nausea and vomiting)   . Rotator cuff tear, left     ALLERGIES:  is allergic to no known allergies.  MEDICATIONS:  Current Outpatient Prescriptions  Medication Sig Dispense Refill  . budesonide-formoterol (SYMBICORT) 80-4.5 MCG/ACT inhaler Inhale 2 puffs into the lungs 2 (two) times daily. 1 Inhaler 5  . chlorpheniramine-HYDROcodone (TUSSIONEX) 10-8 MG/5ML SUER Take 5 mLs by mouth every 12 (twelve) hours as needed for cough. 473 mL 0  . dexamethasone (DECADRON) 4 MG tablet 4 mg by mouth twice a day the day before, day of and day after the chemotherapy 40 tablet 1  . enoxaparin (LOVENOX) 150 MG/ML injection Inject 1 mL (150 mg total) into the skin daily. 30 Syringe 2  . folic acid (FOLVITE) 1 MG tablet Take 1 tablet (1 mg total) by mouth daily. 30 tablet 4  . LORazepam (ATIVAN) 0.5 MG tablet 1 tablet po q 4-6 hours prn anxiety and one po 30 minutes prior to radiation or MRI 30 tablet 0  . losartan (COZAAR) 25 MG tablet Take 25 mg by mouth daily.  1  . Multiple Vitamin (MULTIVITAMIN WITH MINERALS) TABS tablet Take 1 tablet by mouth daily.    . pantoprazole (PROTONIX) 40 MG tablet Take 40 mg  by mouth daily.  2  . prochlorperazine (COMPAZINE) 10 MG tablet TAKE 1 TABLET BY MOUTH  EVERY 6 HOURS AS NEEDED FOR NAUSEA OR VOMITING 30 tablet 0  . sucralfate (CARAFATE) 1 GM/10ML suspension Take 10 mLs (1 g total) by mouth 4 (four) times daily -  with meals and at bedtime. 420 mL 0  . traMADol (ULTRAM) 50 MG tablet Take 1 tablet (50 mg total) by mouth every 12 (twelve) hours as needed. 30 tablet 0   No current  facility-administered medications for this visit.     SURGICAL HISTORY:  Past Surgical History:  Procedure Laterality Date  . INGUINAL HERNIA REPAIR    . SHOULDER ARTHROSCOPY WITH ROTATOR CUFF REPAIR Left 11/12/2016   Procedure: SHOULDER ARTHROSCOPY WITH ROTATOR CUFF REPAIR AND SUBACROMIAL DECOMPRESSION;  Surgeon: Tania Ade, MD;  Location: [facility];  Service: Orthopedics;  Laterality: Left;  SHOULDER ARTHROSCOPY WITH ROTATOR CUFF REPAIR AND SUBACROMIAL DECOMPRESSION  . TONSILLECTOMY    . TOTAL KNEE ARTHROPLASTY    . VIDEO BRONCHOSCOPY Bilateral 12/10/2016   Procedure: VIDEO BRONCHOSCOPY WITHOUT FLUORO;  Surgeon: Tanda Rockers, MD;  Location: [facility];  Service: Cardiopulmonary;  Laterality: Bilateral;    REVIEW OF SYSTEMS:  Constitutional: positive for fatigue Eyes: negative Ears, nose, mouth, throat, and face: negative Respiratory: negative Cardiovascular: negative Gastrointestinal: negative Genitourinary:negative Integument/breast: negative Hematologic/lymphatic: negative Musculoskeletal:negative Neurological: negative Behavioral/Psych: negative Endocrine: negative Allergic/Immunologic: negative   PHYSICAL EXAMINATION: General appearance: alert, cooperative, fatigued and no distress Head: Normocephalic, without obvious abnormality, atraumatic Neck: no adenopathy, no JVD, supple, symmetrical, trachea midline and thyroid not enlarged, symmetric, no tenderness/mass/nodules Lymph nodes: Cervical, supraclavicular, and axillary nodes normal. Resp: clear to auscultation bilaterally Back: symmetric, no curvature. ROM normal. No CVA tenderness. Cardio: regular rate and rhythm, S1, S2 normal, no murmur, click, rub or gallop GI: soft, non-tender; bowel sounds normal; no masses,  no organomegaly Extremities: extremities normal, atraumatic, no cyanosis or edema Neurologic: Alert and oriented X 3, normal strength and tone. Normal symmetric reflexes. Normal coordination and gait  ECOG  PERFORMANCE STATUS: 1 - Symptomatic but completely ambulatory  Blood pressure (!) 140/103, pulse 81, temperature 98.8 F (37.1 C), temperature source Oral, resp. rate 18, height 6\' 1"  (1.854 m), weight 233 lb 1.6 oz (105.7 kg), SpO2 100 %.  LABORATORY DATA: Lab Results  Component Value Date   WBC 2.1 (L) 06/23/2017   HGB 11.1 (L) 06/23/2017   HCT 32.7 (L) 06/23/2017   MCV 117.6 (H) 06/23/2017   PLT 83 (L) 06/23/2017      Chemistry      Component Value Date/Time   NA 139 06/23/2017 0948   K 4.6 06/23/2017 0948   CL 103 11/26/2016 0438   CO2 25 06/23/2017 0948   BUN 9.5 06/23/2017 0948   CREATININE 1.1 06/23/2017 0948      Component Value Date/Time   CALCIUM 9.7 06/23/2017 0948   ALKPHOS 79 06/23/2017 0948   AST 14 06/23/2017 0948   ALT 19 06/23/2017 0948   BILITOT 0.30 06/23/2017 0948       RADIOGRAPHIC STUDIES: Ct Chest W Contrast  Result Date: 07/02/2017 : Please see accession number [id number] for the complete report of CT chest, abdomen, and pelvis. Electronically Signed   By: Van Clines M.D.   On: 07/02/2017 18:16   Ct Abdomen Pelvis W Contrast  Result Date: 07/02/2017 CLINICAL DATA:  Large cell carcinoma of the left lung with intracranial metastatic disease and liver lesion. Ongoing chemotherapy. Restaging. EXAM: CT CHEST, ABDOMEN,  AND PELVIS WITH CONTRAST TECHNIQUE: Multidetector CT imaging of the chest, abdomen and pelvis was performed following the standard protocol during bolus administration of intravenous contrast. CONTRAST:  143mL ISOVUE-300 IOPAMIDOL (ISOVUE-300) INJECTION 61% COMPARISON:  Multiple exams, including 05/06/2017 FINDINGS: CT CHEST FINDINGS Cardiovascular: Unremarkable Mediastinum/Nodes: Left infrahilar node 0.8 cm in short axis, formerly 0.9 cm. The prior left paraesophageal lymph node is not individually appreciable on today' s exam. Lungs/Pleura: New patchy left perihilar and infrahilar airspace opacity in the vicinity of the prior left  upper lobe nodule along the major fissure. 8 by 6 mm left lower lobe pulmonary nodule on image 125/4, previously 8 by 8 mm. The Previous segmental airway plugging in the left lower lobe is improved compared to prior. Cylindrical bronchiectasis in the left lower lobe. Musculoskeletal: Mild levoconvex upper thoracic scoliosis. CT ABDOMEN PELVIS FINDINGS Hepatobiliary: The patient has a history of a lesion in segment 5 of the liver near the gallbladder, this had resolves on the patient's prior CT from 05/06/2017 continues to not be visible. No abnormal liver lesion is identified. Gallbladder unremarkable. Pancreas: Unremarkable Spleen: Unremarkable Adrenals/Urinary Tract: Adrenal glands normal. Stable small exophytic 9 mm hypodense lesion from the right kidney lower pole, likely a cyst but technically too small to characterize. Stomach/Bowel: Unremarkable Vascular/Lymphatic: Mild aortoiliac atherosclerotic vascular disease. 10 mm left external iliac node on image 121/2, borderline prominent. Right-sided external iliac node 0.8 cm on the same image. Reproductive: Unremarkable Other: No supplemental non-categorized findings. Musculoskeletal: Hernia mesh along the anterior abdominal wall inferiorly. Vague nodularity in the subcutaneous tissues the anterior abdomen likely injection related. Lumbar spondylosis and degenerative disc disease causing impingement at L3-4, L4-5, and L5-S1. IMPRESSION: 1. There is some increase in left perihilar in infrahilar localized airspace opacity, possibly from radiation pneumonitis. Slightly reduced adjacent left infrahilar nodal prominence. 2. There has been improvement in patency of segmental branches of the left lower lobe compared to prior. Several of these airways were plugged previously. Underlying cylindrical bronchiectasis in the left lower lobe. 3. The previously hypermetabolic lesion in in segment 5 of the liver near the gallbladder continues to be an apparent on CT, suggesting  resolution. 4. Minimal reduction in size of the left lower lobe pulmonary nodule posterolaterally on image 125/4, currently 0.8 by 0.6 cm. 5. There is a borderline enlarged left external iliac lymph node which is likely benign but warrants surveillance. 6. Lumbar spondylosis and degenerative disc disease causing impingement at L3-4, L4-5, and L5-S1. 7.  Aortic Atherosclerosis (ICD10-I70.0). Electronically Signed   By: Van Clines M.D.   On: 07/02/2017 17:45    ASSESSMENT AND PLAN: This is a very pleasant 51 years old white male with stage IV non-small cell lung cancer, high-grade neuroendocrine carcinoma presented with locally advanced disease in the chest as well as solitary brain metastasis and 2 liver lesions. The patient is status post stereotactic radiotherapy to the solitary brain metastasis as well as short course of concurrent chemoradiation to the locally advanced disease in the chest. He recently completed 6 cycles of systemic chemotherapy with carboplatin, Alimta and Avastin and tolerated this treatment fairly well. He had recent CT scan of the chest, abdomen and pelvis. I personally and independently reviewed the scan images and discuss the results with the patient and his wife today. His scan showed no evidence for disease progression. I discussed with the patient his treatment options including close observation and monitoring versus consideration of maintenance treatment with Alimta 500 MG/M2 and Avastin 15 MG/KG every 3 weeks.  The patient is interested in proceeding with the treatment and I reminded him of the adverse effect of this treatment. He is expected to start the first dose of this treatment on 07/13/2017. He will continue his treatment with folic acid and Decadron premedication for the treatment of her Alimta. The patient would come back for follow-up visit in 4 weeks for evaluation with the start of cycle #2. He was advised to call immediately if he has any concerning  symptoms in the interval. The patient voices understanding of current disease status and treatment options and is in agreement with the current care plan. All questions were answered. The patient knows to call the clinic with any problems, questions or concerns. We can certainly see the patient much sooner if necessary.  Disclaimer: This note was dictated with voice recognition software. Similar sounding words can inadvertently be transcribed and may not be corrected upon review.

## 2017-07-06 NOTE — Telephone Encounter (Signed)
Called patient to see if he is still taking Tussionex because his prior authorization will be expiring soon.  Requested a return call.

## 2017-07-13 ENCOUNTER — Other Ambulatory Visit (HOSPITAL_BASED_OUTPATIENT_CLINIC_OR_DEPARTMENT_OTHER): Payer: 59

## 2017-07-13 ENCOUNTER — Ambulatory Visit (HOSPITAL_BASED_OUTPATIENT_CLINIC_OR_DEPARTMENT_OTHER): Payer: 59

## 2017-07-13 VITALS — BP 152/98 | HR 87 | Temp 98.3°F | Resp 18

## 2017-07-13 DIAGNOSIS — C7A1 Malignant poorly differentiated neuroendocrine tumors: Secondary | ICD-10-CM | POA: Diagnosis not present

## 2017-07-13 DIAGNOSIS — Z5112 Encounter for antineoplastic immunotherapy: Secondary | ICD-10-CM

## 2017-07-13 DIAGNOSIS — C3492 Malignant neoplasm of unspecified part of left bronchus or lung: Secondary | ICD-10-CM

## 2017-07-13 DIAGNOSIS — Z5111 Encounter for antineoplastic chemotherapy: Secondary | ICD-10-CM

## 2017-07-13 DIAGNOSIS — C7B8 Other secondary neuroendocrine tumors: Secondary | ICD-10-CM | POA: Diagnosis not present

## 2017-07-13 LAB — CBC WITH DIFFERENTIAL/PLATELET
BASO%: 0.3 % (ref 0.0–2.0)
Basophils Absolute: 0 10*3/uL (ref 0.0–0.1)
EOS%: 2 % (ref 0.0–7.0)
Eosinophils Absolute: 0.1 10*3/uL (ref 0.0–0.5)
HCT: 39.2 % (ref 38.4–49.9)
HGB: 13.4 g/dL (ref 13.0–17.1)
LYMPH%: 22.9 % (ref 14.0–49.0)
MCH: 40.4 pg — ABNORMAL HIGH (ref 27.2–33.4)
MCHC: 34.2 g/dL (ref 32.0–36.0)
MCV: 118.1 fL — ABNORMAL HIGH (ref 79.3–98.0)
MONO#: 0.4 10*3/uL (ref 0.1–0.9)
MONO%: 12.1 % (ref 0.0–14.0)
NEUT#: 2.2 10*3/uL (ref 1.5–6.5)
NEUT%: 62.7 % (ref 39.0–75.0)
Platelets: 141 10*3/uL (ref 140–400)
RBC: 3.32 10*6/uL — ABNORMAL LOW (ref 4.20–5.82)
RDW: 14.8 % — ABNORMAL HIGH (ref 11.0–14.6)
WBC: 3.5 10*3/uL — ABNORMAL LOW (ref 4.0–10.3)
lymph#: 0.8 10*3/uL — ABNORMAL LOW (ref 0.9–3.3)

## 2017-07-13 LAB — COMPREHENSIVE METABOLIC PANEL
ALT: 24 U/L (ref 0–55)
AST: 18 U/L (ref 5–34)
Albumin: 3.9 g/dL (ref 3.5–5.0)
Alkaline Phosphatase: 72 U/L (ref 40–150)
Anion Gap: 11 mEq/L (ref 3–11)
BUN: 11.7 mg/dL (ref 7.0–26.0)
CO2: 23 mEq/L (ref 22–29)
Calcium: 9.3 mg/dL (ref 8.4–10.4)
Chloride: 105 mEq/L (ref 98–109)
Creatinine: 1.1 mg/dL (ref 0.7–1.3)
EGFR: 77 mL/min/{1.73_m2} — ABNORMAL LOW (ref >90)
Glucose: 112 mg/dl (ref 70–140)
Potassium: 4 mEq/L (ref 3.5–5.1)
Sodium: 139 mEq/L (ref 136–145)
Total Bilirubin: 0.34 mg/dL (ref 0.20–1.20)
Total Protein: 7.4 g/dL (ref 6.4–8.3)

## 2017-07-13 LAB — UA PROTEIN, DIPSTICK - CHCC: Protein, ur: NEGATIVE mg/dL

## 2017-07-13 MED ORDER — SODIUM CHLORIDE 0.9 % IV SOLN: 15.0000 mg/kg | Freq: Once | INTRAVENOUS | Status: DC

## 2017-07-13 MED ORDER — SODIUM CHLORIDE 0.9 % IV SOLN
510.0000 mg/m2 | Freq: Once | INTRAVENOUS | Status: AC
  Administered 2017-07-13: 1200 mg via INTRAVENOUS
  Filled 2017-07-13: qty 40

## 2017-07-13 MED ORDER — SODIUM CHLORIDE 0.9 % IV SOLN: 500.0000 mg/m2 | Freq: Once | INTRAVENOUS | Status: DC

## 2017-07-13 MED ORDER — SODIUM CHLORIDE 0.9 % IV SOLN
1600.0000 mg | Freq: Once | INTRAVENOUS | Status: AC
  Administered 2017-07-13: 1600 mg via INTRAVENOUS
  Filled 2017-07-13: qty 64

## 2017-07-13 MED ORDER — PROCHLORPERAZINE MALEATE 10 MG PO TABS
10.0000 mg | ORAL_TABLET | Freq: Once | ORAL | Status: AC
  Administered 2017-07-13: 10 mg via ORAL

## 2017-07-13 MED ORDER — PROCHLORPERAZINE MALEATE 10 MG PO TABS
ORAL_TABLET | ORAL | Status: AC
  Filled 2017-07-13: qty 1

## 2017-07-13 MED ORDER — SODIUM CHLORIDE 0.9 % IV SOLN
Freq: Once | INTRAVENOUS | Status: AC
Start: 2017-07-13 — End: 2017-07-13
  Administered 2017-07-13: 11:00:00 via INTRAVENOUS

## 2017-07-13 NOTE — Progress Notes (Signed)
Per Dr. Julien Nordmann, okay to treat pt with BP of 149/102.

## 2017-07-13 NOTE — Patient Instructions (Signed)
[facility] Discharge Instructions for Patients Receiving Chemotherapy  Today you received the following chemotherapy agents Alimta and Avastin  To help prevent nausea and vomiting after your treatment, we encourage you to take your nausea medication as directed   If you develop nausea and vomiting that is not controlled by your nausea medication, call the clinic.   BELOW ARE SYMPTOMS THAT SHOULD BE REPORTED IMMEDIATELY:  *FEVER GREATER THAN 100.5 F  *CHILLS WITH OR WITHOUT FEVER  NAUSEA AND VOMITING THAT IS NOT CONTROLLED WITH YOUR NAUSEA MEDICATION  *UNUSUAL SHORTNESS OF BREATH  *UNUSUAL BRUISING OR BLEEDING  TENDERNESS IN MOUTH AND THROAT WITH OR WITHOUT PRESENCE OF ULCERS  *URINARY PROBLEMS  *BOWEL PROBLEMS  UNUSUAL RASH Items with * indicate a potential emergency and should be followed up as soon as possible.  Feel free to call the clinic should you have any questions or concerns. The clinic phone number is [phone].  Please show the [place] [facility] at check-in to the Emergency Department and triage nurse.   Pemetrexed (Alimta) injection What is this medicine? PEMETREXED (PEM e TREX ed) is a chemotherapy drug used to treat lung cancers like non-small cell lung cancer and mesothelioma. It may also be used to treat other cancers. This medicine may be used for other purposes; ask your health care provider or pharmacist if you have questions. COMMON BRAND NAME(S): Alimta What should I tell my health care provider before I take this medicine? They need to know if you have any of these conditions: -infection (especially a virus infection such as chickenpox, cold sores, or herpes) -kidney disease -low blood counts, like low white cell, platelet, or red cell counts -lung or breathing disease, like asthma -radiation therapy -an unusual or allergic reaction to pemetrexed, other medicines, foods, dyes, or preservative -pregnant or trying to get  pregnant -breast-feeding How should I use this medicine? This drug is given as an infusion into a vein. It is administered in a hospital or clinic by a specially trained health care professional. Talk to your pediatrician regarding the use of this medicine in children. Special care may be needed. Overdosage: If you think you have taken too much of this medicine contact a poison control center or emergency room at once. NOTE: This medicine is only for you. Do not share this medicine with others. What if I miss a dose? It is important not to miss your dose. Call your doctor or health care professional if you are unable to keep an appointment. What may interact with this medicine? This medicine may interact with the following medications: -Ibuprofen This list may not describe all possible interactions. Give your health care provider a list of all the medicines, herbs, non-prescription drugs, or dietary supplements you use. Also tell them if you smoke, drink alcohol, or use illegal drugs. Some items may interact with your medicine. What should I watch for while using this medicine? Visit your doctor for checks on your progress. This drug may make you feel generally unwell. This is not uncommon, as chemotherapy can affect healthy cells as well as cancer cells. Report any side effects. Continue your course of treatment even though you feel ill unless your doctor tells you to stop. In some cases, you may be given additional medicines to help with side effects. Follow all directions for their use. Call your doctor or health care professional for advice if you get a fever, chills or sore throat, or other symptoms of a cold or flu.  Do not treat yourself. This drug decreases your body's ability to fight infections. Try to avoid being around people who are sick. This medicine may increase your risk to bruise or bleed. Call your doctor or health care professional if you notice any unusual bleeding. Be careful  brushing and flossing your teeth or using a toothpick because you may get an infection or bleed more easily. If you have any dental work done, tell your dentist you are receiving this medicine. Avoid taking products that contain aspirin, acetaminophen, ibuprofen, naproxen, or ketoprofen unless instructed by your doctor. These medicines may hide a fever. Call your doctor or health care professional if you get diarrhea or mouth sores. Do not treat yourself. To protect your kidneys, drink water or other fluids as directed while you are taking this medicine. Do not become pregnant while taking this medicine or for 6 months after stopping it. Women should inform their doctor if they wish to become pregnant or think they might be pregnant. Men should not father a child while taking this medicine and for 3 months after stopping it. This may interfere with the ability to father a child. You should talk to your doctor or health care professional if you are concerned about your fertility. There is a potential for serious side effects to an unborn child. Talk to your health care professional or pharmacist for more information. Do not breast-feed an infant while taking this medicine or for 1 week after stopping it. What side effects may I notice from receiving this medicine? Side effects that you should report to your doctor or health care professional as soon as possible: -allergic reactions like skin rash, itching or hives, swelling of the face, lips, or tongue -breathing problems -redness, blistering, peeling or loosening of the skin, including inside the mouth -signs and symptoms of bleeding such as bloody or black, tarry stools; red or dark-brown urine; spitting up blood or brown material that looks like coffee grounds; red spots on the skin; unusual bruising or bleeding from the eye, gums, or nose -signs and symptoms of infection like fever or chills; cough; sore throat; pain or trouble passing urine -signs  and symptoms of kidney injury like trouble passing urine or change in the amount of urine -signs and symptoms of liver injury like dark yellow or brown urine; general ill feeling or flu-like symptoms; light-colored stools; loss of appetite; nausea; right upper belly pain; unusually weak or tired; yellowing of the eyes or skin Side effects that usually do not require medical attention (report to your doctor or health care professional if they continue or are bothersome): -constipation -dizziness -mouth sores -nausea, vomiting -pain, tingling, numbness in the hands or feet -unusually weak or tired This list may not describe all possible side effects. Call your doctor for medical advice about side effects. You may report side effects to FDA at 1-800-FDA-1088. Where should I keep my medicine? This drug is given in a hospital or clinic and will not be stored at home. NOTE: This sheet is a summary. It may not cover all possible information. If you have questions about this medicine, talk to your doctor, pharmacist, or health care provider.  2018 Elsevier/Gold Standard (2016-07-28 18:51:46)   Bevacizumab (Avastin) injection What is this medicine? BEVACIZUMAB (be va SIZ yoo mab) is a monoclonal antibody. It is used to treat many types of cancer. This medicine may be used for other purposes; ask your health care provider or pharmacist if you have questions. COMMON BRAND NAME(S):  Avastin What should I tell my health care provider before I take this medicine? They need to know if you have any of these conditions: -diabetes -heart disease -high blood pressure -history of coughing up blood -prior anthracycline chemotherapy (e.g., doxorubicin, daunorubicin, epirubicin) -recent or ongoing radiation therapy -recent or planning to have surgery -stroke -an unusual or allergic reaction to bevacizumab, hamster proteins, mouse proteins, other medicines, foods, dyes, or preservatives -pregnant or trying  to get pregnant -breast-feeding How should I use this medicine? This medicine is for infusion into a vein. It is given by a health care professional in a hospital or clinic setting. Talk to your pediatrician regarding the use of this medicine in children. Special care may be needed. Overdosage: If you think you have taken too much of this medicine contact a poison control center or emergency room at once. NOTE: This medicine is only for you. Do not share this medicine with others. What if I miss a dose? It is important not to miss your dose. Call your doctor or health care professional if you are unable to keep an appointment. What may interact with this medicine? Interactions are not expected. This list may not describe all possible interactions. Give your health care provider a list of all the medicines, herbs, non-prescription drugs, or dietary supplements you use. Also tell them if you smoke, drink alcohol, or use illegal drugs. Some items may interact with your medicine. What should I watch for while using this medicine? Your condition will be monitored carefully while you are receiving this medicine. You will need important blood work and urine testing done while you are taking this medicine. This medicine may increase your risk to bruise or bleed. Call your doctor or health care professional if you notice any unusual bleeding. This medicine should be started at least 28 days following major surgery and the site of the surgery should be totally healed. Check with your doctor before scheduling dental work or surgery while you are receiving this treatment. Talk to your doctor if you have recently had surgery or if you have a wound that has not healed. Do not become pregnant while taking this medicine or for 6 months after stopping it. Women should inform their doctor if they wish to become pregnant or think they might be pregnant. There is a potential for serious side effects to an unborn child.  Talk to your health care professional or pharmacist for more information. Do not breast-feed an infant while taking this medicine and for 6 months after the last dose. This medicine has caused ovarian failure in some women. This medicine may interfere with the ability to have a child. You should talk to your doctor or health care professional if you are concerned about your fertility. What side effects may I notice from receiving this medicine? Side effects that you should report to your doctor or health care professional as soon as possible: -allergic reactions like skin rash, itching or hives, swelling of the face, lips, or tongue -chest pain or chest tightness -chills -coughing up blood -high fever -seizures -severe constipation -signs and symptoms of bleeding such as bloody or black, tarry stools; red or dark-brown urine; spitting up blood or brown material that looks like coffee grounds; red spots on the skin; unusual bruising or bleeding from the eye, gums, or nose -signs and symptoms of a blood clot such as breathing problems; chest pain; severe, sudden headache; pain, swelling, warmth in the leg -signs and symptoms of a stroke  like changes in vision; confusion; trouble speaking or understanding; severe headaches; sudden numbness or weakness of the face, arm or leg; trouble walking; dizziness; loss of balance or coordination -stomach pain -sweating -swelling of legs or ankles -vomiting -weight gain Side effects that usually do not require medical attention (report to your doctor or health care professional if they continue or are bothersome): -back pain -changes in taste -decreased appetite -dry skin -nausea -tiredness This list may not describe all possible side effects. Call your doctor for medical advice about side effects. You may report side effects to FDA at 1-800-FDA-1088. Where should I keep my medicine? This drug is given in a hospital or clinic and will not be stored at  home. NOTE: This sheet is a summary. It may not cover all possible information. If you have questions about this medicine, talk to your doctor, pharmacist, or health care provider.  2018 Elsevier/Gold Standard (2016-09-25 14:33:29)

## 2017-08-03 ENCOUNTER — Encounter: Payer: Self-pay | Admitting: Internal Medicine

## 2017-08-03 ENCOUNTER — Other Ambulatory Visit: Payer: 59

## 2017-08-03 ENCOUNTER — Ambulatory Visit (HOSPITAL_BASED_OUTPATIENT_CLINIC_OR_DEPARTMENT_OTHER): Payer: 59

## 2017-08-03 ENCOUNTER — Other Ambulatory Visit (HOSPITAL_BASED_OUTPATIENT_CLINIC_OR_DEPARTMENT_OTHER): Payer: 59

## 2017-08-03 ENCOUNTER — Ambulatory Visit: Payer: 59

## 2017-08-03 ENCOUNTER — Ambulatory Visit (HOSPITAL_BASED_OUTPATIENT_CLINIC_OR_DEPARTMENT_OTHER): Payer: 59 | Admitting: Internal Medicine

## 2017-08-03 VITALS — BP 142/102 | HR 126 | Temp 98.3°F | Resp 20 | Ht 73.0 in | Wt 232.7 lb

## 2017-08-03 VITALS — BP 149/101 | HR 114 | Temp 98.8°F | Resp 20

## 2017-08-03 DIAGNOSIS — Z5112 Encounter for antineoplastic immunotherapy: Secondary | ICD-10-CM

## 2017-08-03 DIAGNOSIS — C3492 Malignant neoplasm of unspecified part of left bronchus or lung: Secondary | ICD-10-CM

## 2017-08-03 DIAGNOSIS — C7931 Secondary malignant neoplasm of brain: Secondary | ICD-10-CM

## 2017-08-03 DIAGNOSIS — C7A1 Malignant poorly differentiated neuroendocrine tumors: Secondary | ICD-10-CM

## 2017-08-03 DIAGNOSIS — Z5111 Encounter for antineoplastic chemotherapy: Secondary | ICD-10-CM | POA: Diagnosis not present

## 2017-08-03 DIAGNOSIS — R000 Tachycardia, unspecified: Secondary | ICD-10-CM

## 2017-08-03 DIAGNOSIS — I10 Essential (primary) hypertension: Secondary | ICD-10-CM

## 2017-08-03 DIAGNOSIS — C7B8 Other secondary neuroendocrine tumors: Secondary | ICD-10-CM | POA: Diagnosis not present

## 2017-08-03 LAB — COMPREHENSIVE METABOLIC PANEL
ALT: 31 U/L (ref 0–55)
AST: 18 U/L (ref 5–34)
Albumin: 4 g/dL (ref 3.5–5.0)
Alkaline Phosphatase: 77 U/L (ref 40–150)
Anion Gap: 13 mEq/L — ABNORMAL HIGH (ref 3–11)
BUN: 11.5 mg/dL (ref 7.0–26.0)
CO2: 21 mEq/L — ABNORMAL LOW (ref 22–29)
Calcium: 9.9 mg/dL (ref 8.4–10.4)
Chloride: 104 mEq/L (ref 98–109)
Creatinine: 1.1 mg/dL (ref 0.7–1.3)
EGFR: >60 mL/min/{1.73_m2} (ref >60)
Glucose: 155 mg/dl — ABNORMAL HIGH (ref 70–140)
Potassium: 4.4 mEq/L (ref 3.5–5.1)
Sodium: 138 mEq/L (ref 136–145)
Total Bilirubin: 0.52 mg/dL (ref 0.20–1.20)
Total Protein: 7.9 g/dL (ref 6.4–8.3)

## 2017-08-03 LAB — CBC WITH DIFFERENTIAL/PLATELET
BASO%: 0.4 % (ref 0.0–2.0)
Basophils Absolute: 0 10*3/uL (ref 0.0–0.1)
EOS%: 0.1 % (ref 0.0–7.0)
Eosinophils Absolute: 0 10*3/uL (ref 0.0–0.5)
HCT: 43.2 % (ref 38.4–49.9)
HGB: 14.7 g/dL (ref 13.0–17.1)
LYMPH%: 11.4 % — ABNORMAL LOW (ref 14.0–49.0)
MCH: 39.6 pg — ABNORMAL HIGH (ref 27.2–33.4)
MCHC: 34 g/dL (ref 32.0–36.0)
MCV: 116.3 fL — ABNORMAL HIGH (ref 79.3–98.0)
MONO#: 0.2 10*3/uL (ref 0.1–0.9)
MONO%: 4.4 % (ref 0.0–14.0)
NEUT#: 3.5 10*3/uL (ref 1.5–6.5)
NEUT%: 83.7 % — ABNORMAL HIGH (ref 39.0–75.0)
Platelets: 250 10*3/uL (ref 140–400)
RBC: 3.71 10*6/uL — ABNORMAL LOW (ref 4.20–5.82)
RDW: 14.5 % (ref 11.0–14.6)
WBC: 4.2 10*3/uL (ref 4.0–10.3)
lymph#: 0.5 10*3/uL — ABNORMAL LOW (ref 0.9–3.3)

## 2017-08-03 LAB — UA PROTEIN, DIPSTICK - CHCC: Protein, ur: NEGATIVE mg/dL

## 2017-08-03 MED ORDER — SODIUM CHLORIDE 0.9 % IV SOLN
512.0000 mg/m2 | Freq: Once | INTRAVENOUS | Status: AC
  Administered 2017-08-03: 1200 mg via INTRAVENOUS
  Filled 2017-08-03: qty 8

## 2017-08-03 MED ORDER — PROCHLORPERAZINE MALEATE 10 MG PO TABS
10.0000 mg | ORAL_TABLET | Freq: Once | ORAL | Status: AC
  Administered 2017-08-03: 10 mg via ORAL

## 2017-08-03 MED ORDER — SODIUM CHLORIDE 0.9 % IV SOLN
Freq: Once | INTRAVENOUS | Status: AC
  Administered 2017-08-03: 12:00:00 via INTRAVENOUS

## 2017-08-03 MED ORDER — PROCHLORPERAZINE MALEATE 10 MG PO TABS
ORAL_TABLET | ORAL | Status: AC
  Filled 2017-08-03: qty 1

## 2017-08-03 MED ORDER — SODIUM CHLORIDE 0.9 % IV SOLN
15.2000 mg/kg | Freq: Once | INTRAVENOUS | Status: AC
  Administered 2017-08-03: 1600 mg via INTRAVENOUS
  Filled 2017-08-03: qty 64

## 2017-08-03 NOTE — Progress Notes (Signed)
[facility] [phone]   Fax:([phone]) [phone]  OFFICE PROGRESS NOTE  [name], MD 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  PRIOR THERAPY:  1) Stereotactic radiotherapy to a solitary brain metastasis under the care of Dr. Lisbeth Renshaw on 01/25/2017. 2) Short course of concurrent chemoradiation with weekly carboplatin for AUC of 2 and paclitaxel 45 MG/M2 to the locally advanced disease in the chest. Status post 3 cycles. 3) Systemic chemotherapy with carboplatin for AUC of 5, Alimta 500 MG/M2 and Avastin 15 MG/KG every 3 weeks. First dose of 02/15/2017. Status post 6 cycles. Last dose 06/08/2017 with stable disease.   CURRENT THERAPY: Maintenance systemic chemotherapy with Alimta 500 MG/M2 in addition to Avastin 15 MG/KG every 3 weeks. First dose 07/13/2017.  INTERVAL HISTORY: Calvin Wagner 51 y.o. male returns to the clinic today for follow-up visit accompanied by his wife. The patient tolerated the first cycle of his maintenance treatment with Alimta and Avastin fairly well except for mild nausea for a few days after the treatment. He also has some abdominal cramps. He denied having any chest pain, shortness of breath, cough or hemoptysis. He denied having any fever or chills. He has no nausea, vomiting, diarrhea or constipation. He has no recent weight loss or night sweats. He is here today for evaluation before starting cycle #2 of his treatment.   MEDICAL HISTORY: Past Medical History:  Diagnosis Date  . Encounter for antineoplastic chemotherapy 12/31/2016  . GERD 11/08/2009   Qualifier: Diagnosis of  By: Ronnald Ramp MD, Arvid Right Goals of  care, counseling/discussion 12/31/2016  . History of radiation therapy 01/13/2017 - 02/02/2017   Site/dose:  Left lung: 30 Gy in 15 fractions  . HYPERTENSION 11/08/2009   Qualifier: Diagnosis of  By: Ronnald Ramp MD, Arvid Right.   . 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 Migraine 02/25/2012  . Pneumonia   . PONV (postoperative nausea and vomiting)   . Rotator cuff tear, left     ALLERGIES:  is allergic to no known allergies.  MEDICATIONS:  Current Outpatient Prescriptions  Medication Sig Dispense Refill  . budesonide-formoterol (SYMBICORT) 80-4.5 MCG/ACT inhaler Inhale 2 puffs into the lungs 2 (two) times daily. 1 Inhaler 5  . dexamethasone (DECADRON) 4 MG tablet 4 mg by mouth twice a day the day before, day of and day after the chemotherapy 40 tablet 1  . enoxaparin (LOVENOX) 150 MG/ML injection Inject 1 mL (150 mg total) into the skin daily. 30 Syringe 2  . folic acid (FOLVITE) 1 MG tablet Take 1 tablet (1 mg total) by mouth daily. 30 tablet 4  . LORazepam (ATIVAN) 0.5 MG tablet 1 tablet po q 4-6 hours prn anxiety and one po 30 minutes prior to radiation or MRI 30 tablet 0  . losartan (COZAAR) 25 MG tablet Take 25 mg by mouth daily.  1  . Multiple Vitamin (MULTIVITAMIN WITH MINERALS) TABS tablet Take 1 tablet by mouth daily.    . pantoprazole (PROTONIX) 40 MG tablet Take 40 mg by mouth daily.  2  . prochlorperazine (COMPAZINE) 10 MG tablet TAKE 1 TABLET BY MOUTH  EVERY 6 HOURS AS NEEDED FOR NAUSEA  OR VOMITING 30 tablet 0  . traMADol (ULTRAM) 50 MG tablet Take 1 tablet (50 mg total) by mouth every 12 (twelve) hours as needed. 30 tablet 0  . chlorpheniramine-HYDROcodone (TUSSIONEX) 10-8 MG/5ML SUER Take 5 mLs by mouth every 12 (twelve) hours as needed for cough. (Patient not taking: Reported on 08/03/2017) 473 mL 0   No current facility-administered medications for this visit.     SURGICAL HISTORY:  Past Surgical History:  Procedure Laterality Date  . INGUINAL HERNIA  REPAIR    . SHOULDER ARTHROSCOPY WITH ROTATOR CUFF REPAIR Left 11/12/2016   Procedure: SHOULDER ARTHROSCOPY WITH ROTATOR CUFF REPAIR AND SUBACROMIAL DECOMPRESSION;  Surgeon: Tania Ade, MD;  Location: [facility];  Service: Orthopedics;  Laterality: Left;  SHOULDER ARTHROSCOPY WITH ROTATOR CUFF REPAIR AND SUBACROMIAL DECOMPRESSION  . TONSILLECTOMY    . TOTAL KNEE ARTHROPLASTY    . VIDEO BRONCHOSCOPY Bilateral 12/10/2016   Procedure: VIDEO BRONCHOSCOPY WITHOUT FLUORO;  Surgeon: Tanda Rockers, MD;  Location: [facility];  Service: Cardiopulmonary;  Laterality: Bilateral;    REVIEW OF SYSTEMS:  A comprehensive review of systems was negative except for: Gastrointestinal: positive for nausea   PHYSICAL EXAMINATION: General appearance: alert, cooperative, fatigued and no distress Head: Normocephalic, without obvious abnormality, atraumatic Neck: no adenopathy, no JVD, supple, symmetrical, trachea midline and thyroid not enlarged, symmetric, no tenderness/mass/nodules Lymph nodes: Cervical, supraclavicular, and axillary nodes normal. Resp: clear to auscultation bilaterally Back: symmetric, no curvature. ROM normal. No CVA tenderness. Cardio: regular rate and rhythm, S1, S2 normal, no murmur, click, rub or gallop GI: soft, non-tender; bowel sounds normal; no masses,  no organomegaly Extremities: extremities normal, atraumatic, no cyanosis or edema  ECOG PERFORMANCE STATUS: 1 - Symptomatic but completely ambulatory  Blood pressure (!) 142/102, pulse (!) 126, temperature 98.3 F (36.8 C), temperature source Oral, resp. rate 20, height 6\' 1"  (1.854 m), weight 232 lb 11.2 oz (105.6 kg), SpO2 100 %.  LABORATORY DATA: Lab Results  Component Value Date   WBC 4.2 08/03/2017   HGB 14.7 08/03/2017   HCT 43.2 08/03/2017   MCV 116.3 (H) 08/03/2017   PLT 250 08/03/2017      Chemistry      Component Value Date/Time   NA 138 08/03/2017 0933   K 4.4 08/03/2017 0933   CL 103 11/26/2016 0438   CO2 21  (L) 08/03/2017 0933   BUN 11.5 08/03/2017 0933   CREATININE 1.1 08/03/2017 0933      Component Value Date/Time   CALCIUM 9.9 08/03/2017 0933   ALKPHOS 77 08/03/2017 0933   AST 18 08/03/2017 0933   ALT 31 08/03/2017 0933   BILITOT 0.52 08/03/2017 0933       RADIOGRAPHIC STUDIES: No results found.  ASSESSMENT AND PLAN: This is a very pleasant 51 years old white male with stage IV non-small cell lung cancer, high-grade neuroendocrine carcinoma presented with locally advanced disease in the chest as well as solitary brain metastasis and 2 liver lesions. The patient is status post stereotactic radiotherapy to the solitary brain metastasis as well as short course of concurrent chemoradiation to the locally advanced disease in the chest. He recently completed 6 cycles of systemic chemotherapy with carboplatin, Alimta and Avastin and tolerated this treatment fairly well. The patient is currently on maintenance treatment with Alimta and Avastin status post 1 cycle. He tolerated the first cycle of his treatment fairly well. I recommended for him to proceed with cycle #2 today as scheduled. I will see the patient back for follow-up  visit in 3 weeks for evaluation before starting cycle #3. For the hypertension and tachycardia, the patient will continue with his blood pressure medication for now. He was advised to call immediately if he has any concerning symptoms in the interval. The patient voices understanding of current disease status and treatment options and is in agreement with the current care plan. All questions were answered. The patient knows to call the clinic with any problems, questions or concerns. We can certainly see the patient much sooner if necessary.  Disclaimer: This note was dictated with voice recognition software. Similar sounding words can inadvertently be transcribed and may not be corrected upon review.

## 2017-08-03 NOTE — Progress Notes (Signed)
Dr. Julien Nordmann aware of BP/HR. Advised ok to proceed with treatment as ordered. No additional orders. Advised to have patient follow up with PCP as soon as possible. Patient aware and agrees to follow up as requested.

## 2017-08-03 NOTE — Patient Instructions (Signed)
[facility] Discharge Instructions for Patients Receiving Chemotherapy  Today you received the following chemotherapy agents:  Avastin and Alimta.  To help prevent nausea and vomiting after your treatment, we encourage you to take your nausea medication as directed.   If you develop nausea and vomiting that is not controlled by your nausea medication, call the clinic.   BELOW ARE SYMPTOMS THAT SHOULD BE REPORTED IMMEDIATELY:  *FEVER GREATER THAN 100.5 F  *CHILLS WITH OR WITHOUT FEVER  NAUSEA AND VOMITING THAT IS NOT CONTROLLED WITH YOUR NAUSEA MEDICATION  *UNUSUAL SHORTNESS OF BREATH  *UNUSUAL BRUISING OR BLEEDING  TENDERNESS IN MOUTH AND THROAT WITH OR WITHOUT PRESENCE OF ULCERS  *URINARY PROBLEMS  *BOWEL PROBLEMS  UNUSUAL RASH Items with * indicate a potential emergency and should be followed up as soon as possible.  Feel free to call the clinic should you have any questions or concerns. The clinic phone number is [phone].  Please show the [facility] at check-in to the Emergency Department and triage nurse.

## 2017-08-04 DIAGNOSIS — I10 Essential (primary) hypertension: Secondary | ICD-10-CM | POA: Diagnosis not present

## 2017-08-04 DIAGNOSIS — Z1211 Encounter for screening for malignant neoplasm of colon: Secondary | ICD-10-CM | POA: Diagnosis not present

## 2017-08-09 ENCOUNTER — Telehealth: Payer: Self-pay | Admitting: Internal Medicine

## 2017-08-09 NOTE — Telephone Encounter (Signed)
Left message for patient regarding appts added per 10/23 los. Sending confirmation letter as well.

## 2017-08-24 ENCOUNTER — Other Ambulatory Visit (HOSPITAL_BASED_OUTPATIENT_CLINIC_OR_DEPARTMENT_OTHER): Payer: 59

## 2017-08-24 ENCOUNTER — Encounter: Payer: Self-pay | Admitting: Internal Medicine

## 2017-08-24 ENCOUNTER — Ambulatory Visit (HOSPITAL_BASED_OUTPATIENT_CLINIC_OR_DEPARTMENT_OTHER): Payer: 59

## 2017-08-24 ENCOUNTER — Ambulatory Visit (HOSPITAL_BASED_OUTPATIENT_CLINIC_OR_DEPARTMENT_OTHER): Payer: 59 | Admitting: Internal Medicine

## 2017-08-24 ENCOUNTER — Other Ambulatory Visit: Payer: Self-pay | Admitting: Medical Oncology

## 2017-08-24 VITALS — BP 162/102 | HR 114 | Temp 98.5°F | Resp 20

## 2017-08-24 DIAGNOSIS — C3490 Malignant neoplasm of unspecified part of unspecified bronchus or lung: Secondary | ICD-10-CM

## 2017-08-24 DIAGNOSIS — C7A1 Malignant poorly differentiated neuroendocrine tumors: Secondary | ICD-10-CM

## 2017-08-24 DIAGNOSIS — C3492 Malignant neoplasm of unspecified part of left bronchus or lung: Secondary | ICD-10-CM

## 2017-08-24 DIAGNOSIS — C7B8 Other secondary neuroendocrine tumors: Secondary | ICD-10-CM

## 2017-08-24 DIAGNOSIS — Z5112 Encounter for antineoplastic immunotherapy: Secondary | ICD-10-CM

## 2017-08-24 LAB — CBC WITH DIFFERENTIAL/PLATELET
BASO%: 0.5 % (ref 0.0–2.0)
Basophils Absolute: 0 10*3/uL (ref 0.0–0.1)
EOS%: 0.1 % (ref 0.0–7.0)
Eosinophils Absolute: 0 10*3/uL (ref 0.0–0.5)
HCT: 44.2 % (ref 38.4–49.9)
HGB: 14.9 g/dL (ref 13.0–17.1)
LYMPH%: 11.7 % — ABNORMAL LOW (ref 14.0–49.0)
MCH: 38.5 pg — ABNORMAL HIGH (ref 27.2–33.4)
MCHC: 33.7 g/dL (ref 32.0–36.0)
MCV: 114.2 fL — ABNORMAL HIGH (ref 79.3–98.0)
MONO#: 0.1 10*3/uL (ref 0.1–0.9)
MONO%: 2.2 % (ref 0.0–14.0)
NEUT#: 2.7 10*3/uL (ref 1.5–6.5)
NEUT%: 85.5 % — ABNORMAL HIGH (ref 39.0–75.0)
Platelets: 235 10*3/uL (ref 140–400)
RBC: 3.87 10*6/uL — ABNORMAL LOW (ref 4.20–5.82)
RDW: 14.1 % (ref 11.0–14.6)
WBC: 3.2 10*3/uL — ABNORMAL LOW (ref 4.0–10.3)
lymph#: 0.4 10*3/uL — ABNORMAL LOW (ref 0.9–3.3)

## 2017-08-24 LAB — COMPREHENSIVE METABOLIC PANEL
ALT: 57 U/L — ABNORMAL HIGH (ref 0–55)
AST: 26 U/L (ref 5–34)
Albumin: 3.9 g/dL (ref 3.5–5.0)
Alkaline Phosphatase: 76 U/L (ref 40–150)
Anion Gap: 11 mEq/L (ref 3–11)
BUN: 11.3 mg/dL (ref 7.0–26.0)
CO2: 22 mEq/L (ref 22–29)
Calcium: 9.6 mg/dL (ref 8.4–10.4)
Chloride: 104 mEq/L (ref 98–109)
Creatinine: 1.1 mg/dL (ref 0.7–1.3)
EGFR: >60 mL/min/{1.73_m2} (ref >60)
Glucose: 198 mg/dl — ABNORMAL HIGH (ref 70–140)
Potassium: 4.6 mEq/L (ref 3.5–5.1)
Sodium: 137 mEq/L (ref 136–145)
Total Bilirubin: 0.43 mg/dL (ref 0.20–1.20)
Total Protein: 7.9 g/dL (ref 6.4–8.3)

## 2017-08-24 LAB — UA PROTEIN, DIPSTICK - CHCC: Protein, ur: <30 mg/dL

## 2017-08-24 MED ORDER — PROCHLORPERAZINE MALEATE 10 MG PO TABS
10.0000 mg | ORAL_TABLET | Freq: Once | ORAL | Status: AC
  Administered 2017-08-24: 10 mg via ORAL

## 2017-08-24 MED ORDER — SODIUM CHLORIDE 0.9 % IV SOLN
15.1000 mg/kg | Freq: Once | INTRAVENOUS | Status: AC
  Administered 2017-08-24: 1600 mg via INTRAVENOUS
  Filled 2017-08-24: qty 64

## 2017-08-24 MED ORDER — TRAMADOL HCL 50 MG PO TABS: 50.0000 mg | ORAL_TABLET | Freq: Two times a day (BID) | ORAL | 0 refills | Status: DC | PRN

## 2017-08-24 MED ORDER — PEMETREXED DISODIUM CHEMO INJECTION 500 MG
510.0000 mg/m2 | Freq: Once | INTRAVENOUS | Status: AC
  Administered 2017-08-24: 1200 mg via INTRAVENOUS
  Filled 2017-08-24: qty 40

## 2017-08-24 MED ORDER — PROCHLORPERAZINE MALEATE 10 MG PO TABS
ORAL_TABLET | ORAL | Status: AC
  Filled 2017-08-24: qty 1

## 2017-08-24 MED ORDER — SODIUM CHLORIDE 0.9 % IV SOLN
Freq: Once | INTRAVENOUS | Status: AC
  Administered 2017-08-24: 10:00:00 via INTRAVENOUS

## 2017-08-24 MED ORDER — CYANOCOBALAMIN 1000 MCG/ML IJ SOLN
1000.0000 ug | Freq: Once | INTRAMUSCULAR | Status: AC
  Administered 2017-08-24: 1000 ug via INTRAMUSCULAR

## 2017-08-24 MED ORDER — CYANOCOBALAMIN 1000 MCG/ML IJ SOLN
INTRAMUSCULAR | Status: AC
  Filled 2017-08-24: qty 1

## 2017-08-24 NOTE — Progress Notes (Signed)
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  PRIOR THERAPY:  1) Stereotactic radiotherapy to a solitary brain metastasis under the care of Dr. Lisbeth Renshaw on 01/25/2017. 2) Short course of concurrent chemoradiation with weekly carboplatin for AUC of 2 and paclitaxel 45 MG/M2 to the locally advanced disease in the chest. Status post 3 cycles. 3) Systemic chemotherapy with carboplatin for AUC of 5, Alimta 500 MG/M2 and Avastin 15 MG/KG every 3 weeks. First dose of 02/15/2017. Status post 6 cycles. Last dose 06/08/2017 with stable disease.   CURRENT THERAPY: Maintenance systemic chemotherapy with Alimta 500 MG/M2 in addition to Avastin 15 MG/KG every 3 weeks. First dose 07/13/2017.  Status post 2 cycles.  INTERVAL HISTORY: Calvin Wagner 51 y.o. male returns to the clinic today for follow-up visit.  The patient is feeling fine today with no specific complaints except for occasional nosebleed.  He denied having any chest pain, shortness breath, cough or hemoptysis.  He denied having any weight loss or night sweats.  He has no fever or chills.  The patient denied having any nausea, vomiting, diarrhea or constipation.  He continues to tolerate his treatment with Alimta and Avastin fairly well.  He is here today for evaluation before starting cycle #3.  MEDICAL HISTORY: Past Medical History:  Diagnosis Date  . Encounter for antineoplastic chemotherapy 12/31/2016  . GERD 11/08/2009   Qualifier: Diagnosis of  By: Ronnald Ramp MD, Arvid Right Goals of care, counseling/discussion  12/31/2016  . History of radiation therapy 01/13/2017 - 02/02/2017   Site/dose:  Left lung: 30 Gy in 15 fractions  . HYPERTENSION 11/08/2009   Qualifier: Diagnosis of  By: Ronnald Ramp MD, Arvid Right.   . 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 Migraine 02/25/2012  . Pneumonia   . PONV (postoperative nausea and vomiting)   . Rotator cuff tear, left     ALLERGIES:  is allergic to no known allergies.  MEDICATIONS:  Current Outpatient Medications  Medication Sig Dispense Refill  . amLODipine (NORVASC) 2.5 MG tablet Take 2.5 mg daily by mouth.  0  . budesonide-formoterol (SYMBICORT) 80-4.5 MCG/ACT inhaler Inhale 2 puffs into the lungs 2 (two) times daily. 1 Inhaler 5  . chlorpheniramine-HYDROcodone (TUSSIONEX) 10-8 MG/5ML SUER Take 5 mLs by mouth every 12 (twelve) hours as needed for cough. (Patient not taking: Reported on 08/03/2017) 473 mL 0  . dexamethasone (DECADRON) 4 MG tablet 4 mg by mouth twice a day the day before, day of and day after the chemotherapy 40 tablet 1  . enoxaparin (LOVENOX) 150 MG/ML injection Inject 1 mL (150 mg total) into the skin daily. 30 Syringe 2  . folic acid (FOLVITE) 1 MG tablet Take 1 tablet (1 mg total) by mouth daily. 30 tablet 4  . LORazepam (ATIVAN) 0.5 MG tablet 1 tablet po q 4-6 hours prn anxiety and one po 30 minutes prior to radiation or MRI 30 tablet 0  . losartan (COZAAR) 25 MG tablet Take 50 mg daily by mouth.   1  . Multiple Vitamin (MULTIVITAMIN WITH MINERALS) TABS tablet Take  1 tablet by mouth daily.    . pantoprazole (PROTONIX) 40 MG tablet Take 40 mg by mouth daily.  2  . prochlorperazine (COMPAZINE) 10 MG tablet TAKE 1 TABLET BY MOUTH  EVERY 6 HOURS AS NEEDED FOR NAUSEA OR VOMITING 30 tablet 0  . traMADol (ULTRAM) 50 MG tablet Take 1 tablet (50 mg total) every 12 (twelve) hours as needed by mouth. 30 tablet 0   No current facility-administered medications for this visit.     SURGICAL HISTORY:  Past Surgical History:    Procedure Laterality Date  . INGUINAL HERNIA REPAIR    . TONSILLECTOMY    . TOTAL KNEE ARTHROPLASTY      REVIEW OF SYSTEMS:  A comprehensive review of systems was negative except for: Ears, nose, mouth, throat, and face: positive for epistaxis   PHYSICAL EXAMINATION: General appearance: alert, cooperative and no distress Head: Normocephalic, without obvious abnormality, atraumatic Neck: no adenopathy, no JVD, supple, symmetrical, trachea midline and thyroid not enlarged, symmetric, no tenderness/mass/nodules Lymph nodes: Cervical, supraclavicular, and axillary nodes normal. Resp: clear to auscultation bilaterally Back: symmetric, no curvature. ROM normal. No CVA tenderness. Cardio: regular rate and rhythm, S1, S2 normal, no murmur, click, rub or gallop GI: soft, non-tender; bowel sounds normal; no masses,  no organomegaly Extremities: extremities normal, atraumatic, no cyanosis or edema  ECOG PERFORMANCE STATUS: 1 - Symptomatic but completely ambulatory  There were no vitals taken for this visit.  LABORATORY DATA: Lab Results  Component Value Date   WBC 3.2 (L) 08/24/2017   HGB 14.9 08/24/2017   HCT 44.2 08/24/2017   MCV 114.2 (H) 08/24/2017   PLT 235 08/24/2017      Chemistry      Component Value Date/Time   NA 137 08/24/2017 0834   K 4.6 08/24/2017 0834   CL 103 11/26/2016 0438   CO2 22 08/24/2017 0834   BUN 11.3 08/24/2017 0834   CREATININE 1.1 08/24/2017 0834      Component Value Date/Time   CALCIUM 9.6 08/24/2017 0834   ALKPHOS 76 08/24/2017 0834   AST 26 08/24/2017 0834   ALT 57 (H) 08/24/2017 0834   BILITOT 0.43 08/24/2017 0834       RADIOGRAPHIC STUDIES: No results found.  ASSESSMENT AND PLAN: This is a very pleasant 51 years old white male with stage IV non-small cell lung cancer, high-grade neuroendocrine carcinoma presented with locally advanced disease in the chest as well as solitary brain metastasis and 2 liver lesions. The patient is status  post stereotactic radiotherapy to the solitary brain metastasis as well as short course of concurrent chemoradiation to the locally advanced disease in the chest. He recently completed 6 cycles of systemic chemotherapy with carboplatin, Alimta and Avastin and tolerated this treatment fairly well. The patient is currently on maintenance treatment with Alimta and Avastin status post 2 cycles.  The patient is tolerating his treatment well with no significant adverse effects. I recommended for him to proceed with cycle #3 today as a schedule. I will see him back for follow-up visit in 3 weeks for evaluation after repeating CT scan of the chest, abdomen and pelvis for restaging of his disease. He was advised to call immediately if he has any concerning symptoms in the interval. The patient voices understanding of current disease status and treatment options and is in agreement with the current care plan. All questions were answered. The patient knows to call the clinic with any problems, questions or concerns. We can certainly see the patient  much sooner if necessary.  Disclaimer: This note was dictated with voice recognition software. Similar sounding words can inadvertently be transcribed and may not be corrected upon review.

## 2017-08-24 NOTE — Patient Instructions (Signed)
[facility] Discharge Instructions for Patients Receiving Chemotherapy  Today you received the following chemotherapy agents :  Avastin, Alimta.  To help prevent nausea and vomiting after your treatment, we encourage you to take your nausea medication as prescribed.   If you develop nausea and vomiting that is not controlled by your nausea medication, call the clinic.   BELOW ARE SYMPTOMS THAT SHOULD BE REPORTED IMMEDIATELY:  *FEVER GREATER THAN 100.5 F  *CHILLS WITH OR WITHOUT FEVER  NAUSEA AND VOMITING THAT IS NOT CONTROLLED WITH YOUR NAUSEA MEDICATION  *UNUSUAL SHORTNESS OF BREATH  *UNUSUAL BRUISING OR BLEEDING  TENDERNESS IN MOUTH AND THROAT WITH OR WITHOUT PRESENCE OF ULCERS  *URINARY PROBLEMS  *BOWEL PROBLEMS  UNUSUAL RASH Items with * indicate a potential emergency and should be followed up as soon as possible.  Feel free to call the clinic should you have any questions or concerns. The clinic phone number is [phone].  Please show the [facility] at check-in to the Emergency Department and triage nurse.

## 2017-08-24 NOTE — Progress Notes (Signed)
OK to treat with ALT 57 today as per Shauna Hugh, RN/Dr. Julien Nordmann.

## 2017-08-27 ENCOUNTER — Other Ambulatory Visit: Payer: Self-pay | Admitting: Radiation Therapy

## 2017-08-27 DIAGNOSIS — C7949 Secondary malignant neoplasm of other parts of nervous system: Principal | ICD-10-CM

## 2017-08-27 DIAGNOSIS — C7931 Secondary malignant neoplasm of brain: Secondary | ICD-10-CM

## 2017-09-01 ENCOUNTER — Other Ambulatory Visit: Payer: 59

## 2017-09-08 ENCOUNTER — Ambulatory Visit: Payer: Self-pay | Admitting: Radiation Oncology

## 2017-09-10 ENCOUNTER — Ambulatory Visit (HOSPITAL_COMMUNITY)
Admission: RE | Admit: 2017-09-10 | Discharge: 2017-09-10 | Disposition: A | Discharge status: Home / Self Care | Payer: 59 | Source: Ambulatory Visit | Attending: Internal Medicine | Admitting: Internal Medicine

## 2017-09-10 DIAGNOSIS — C787 Secondary malignant neoplasm of liver and intrahepatic bile duct: Secondary | ICD-10-CM | POA: Diagnosis not present

## 2017-09-10 DIAGNOSIS — C3490 Malignant neoplasm of unspecified part of unspecified bronchus or lung: Secondary | ICD-10-CM | POA: Diagnosis not present

## 2017-09-10 DIAGNOSIS — Y842 Radiological procedure and radiotherapy as the cause of abnormal reaction of the patient, or of later complication, without mention of misadventure at the time of the procedure: Secondary | ICD-10-CM | POA: Diagnosis not present

## 2017-09-10 DIAGNOSIS — R911 Solitary pulmonary nodule: Secondary | ICD-10-CM | POA: Diagnosis not present

## 2017-09-10 MED ORDER — IOPAMIDOL (ISOVUE-300) INJECTION 61%
100.0000 mL | Freq: Once | INTRAVENOUS | Status: AC | PRN
  Administered 2017-09-10: 100 mL via INTRAVENOUS

## 2017-09-10 MED ORDER — IOPAMIDOL (ISOVUE-300) INJECTION 61%
INTRAVENOUS | Status: AC
  Filled 2017-09-10: qty 100

## 2017-09-11 ENCOUNTER — Ambulatory Visit
Admission: RE | Admit: 2017-09-11 | Discharge: 2017-09-11 | Disposition: A | Discharge status: Home / Self Care | Payer: 59 | Source: Ambulatory Visit | Attending: Radiation Oncology | Admitting: Radiation Oncology

## 2017-09-11 DIAGNOSIS — C7949 Secondary malignant neoplasm of other parts of nervous system: Principal | ICD-10-CM

## 2017-09-11 DIAGNOSIS — C7931 Secondary malignant neoplasm of brain: Secondary | ICD-10-CM | POA: Diagnosis not present

## 2017-09-11 MED ORDER — GADOBENATE DIMEGLUMINE 529 MG/ML IV SOLN
20.0000 mL | Freq: Once | INTRAVENOUS | Status: AC | PRN
  Administered 2017-09-11: 20 mL via INTRAVENOUS

## 2017-09-13 ENCOUNTER — Telehealth: Payer: Self-pay | Admitting: Radiation Oncology

## 2017-09-13 ENCOUNTER — Ambulatory Visit: Payer: Self-pay | Admitting: Radiation Oncology

## 2017-09-13 NOTE — Telephone Encounter (Signed)
I called the patient and his phone was not set up for voicemail. I called his wife and we reviewed his MRI results by phone and would recommend a repeat in 3 months.

## 2017-09-14 ENCOUNTER — Ambulatory Visit (HOSPITAL_BASED_OUTPATIENT_CLINIC_OR_DEPARTMENT_OTHER): Payer: 59

## 2017-09-14 ENCOUNTER — Ambulatory Visit (HOSPITAL_BASED_OUTPATIENT_CLINIC_OR_DEPARTMENT_OTHER): Payer: 59 | Admitting: Oncology

## 2017-09-14 ENCOUNTER — Encounter: Payer: Self-pay | Admitting: Oncology

## 2017-09-14 ENCOUNTER — Other Ambulatory Visit (HOSPITAL_BASED_OUTPATIENT_CLINIC_OR_DEPARTMENT_OTHER): Payer: 59

## 2017-09-14 ENCOUNTER — Ambulatory Visit
Admission: RE | Admit: 2017-09-14 | Discharge: 2017-09-14 | Disposition: A | Discharge status: Home / Self Care | Payer: 59 | Source: Ambulatory Visit | Attending: Radiation Oncology | Admitting: Radiation Oncology

## 2017-09-14 VITALS — BP 144/89 | HR 98

## 2017-09-14 VITALS — BP 138/90 | HR 94 | Temp 98.5°F | Resp 18 | Ht 73.0 in | Wt 239.5 lb

## 2017-09-14 DIAGNOSIS — C7A1 Malignant poorly differentiated neuroendocrine tumors: Secondary | ICD-10-CM | POA: Diagnosis not present

## 2017-09-14 DIAGNOSIS — R040 Epistaxis: Secondary | ICD-10-CM

## 2017-09-14 DIAGNOSIS — C7B8 Other secondary neuroendocrine tumors: Secondary | ICD-10-CM

## 2017-09-14 DIAGNOSIS — C3492 Malignant neoplasm of unspecified part of left bronchus or lung: Secondary | ICD-10-CM

## 2017-09-14 DIAGNOSIS — Z5112 Encounter for antineoplastic immunotherapy: Secondary | ICD-10-CM | POA: Diagnosis not present

## 2017-09-14 DIAGNOSIS — Z5111 Encounter for antineoplastic chemotherapy: Secondary | ICD-10-CM

## 2017-09-14 LAB — COMPREHENSIVE METABOLIC PANEL
ALT: 40 U/L (ref 0–55)
AST: 27 U/L (ref 5–34)
Albumin: 3.6 g/dL (ref 3.5–5.0)
Alkaline Phosphatase: 75 U/L (ref 40–150)
Anion Gap: 9 mEq/L (ref 3–11)
BUN: 9.9 mg/dL (ref 7.0–26.0)
CO2: 24 mEq/L (ref 22–29)
Calcium: 9.2 mg/dL (ref 8.4–10.4)
Chloride: 104 mEq/L (ref 98–109)
Creatinine: 1.1 mg/dL (ref 0.7–1.3)
EGFR: >60 mL/min/{1.73_m2} (ref >60)
Glucose: 151 mg/dl — ABNORMAL HIGH (ref 70–140)
Potassium: 3.8 mEq/L (ref 3.5–5.1)
Sodium: 137 mEq/L (ref 136–145)
Total Bilirubin: 0.45 mg/dL (ref 0.20–1.20)
Total Protein: 7.2 g/dL (ref 6.4–8.3)

## 2017-09-14 LAB — CBC WITH DIFFERENTIAL/PLATELET
BASO%: 0.6 % (ref 0.0–2.0)
Basophils Absolute: 0 10*3/uL (ref 0.0–0.1)
EOS%: 4.3 % (ref 0.0–7.0)
Eosinophils Absolute: 0.1 10*3/uL (ref 0.0–0.5)
HCT: 41.7 % (ref 38.4–49.9)
HGB: 14 g/dL (ref 13.0–17.1)
LYMPH%: 22.9 % (ref 14.0–49.0)
MCH: 37.9 pg — ABNORMAL HIGH (ref 27.2–33.4)
MCHC: 33.6 g/dL (ref 32.0–36.0)
MCV: 112.8 fL — ABNORMAL HIGH (ref 79.3–98.0)
MONO#: 0.4 10*3/uL (ref 0.1–0.9)
MONO%: 17.1 % — ABNORMAL HIGH (ref 0.0–14.0)
NEUT#: 1.4 10*3/uL — ABNORMAL LOW (ref 1.5–6.5)
NEUT%: 55.1 % (ref 39.0–75.0)
Platelets: 198 10*3/uL (ref 140–400)
RBC: 3.7 10*6/uL — ABNORMAL LOW (ref 4.20–5.82)
RDW: 14.6 % (ref 11.0–14.6)
WBC: 2.6 10*3/uL — ABNORMAL LOW (ref 4.0–10.3)
lymph#: 0.6 10*3/uL — ABNORMAL LOW (ref 0.9–3.3)

## 2017-09-14 LAB — UA PROTEIN, DIPSTICK - CHCC: Protein, ur: NEGATIVE mg/dL

## 2017-09-14 MED ORDER — PROCHLORPERAZINE MALEATE 10 MG PO TABS
10.0000 mg | ORAL_TABLET | Freq: Once | ORAL | Status: AC
  Administered 2017-09-14: 10 mg via ORAL

## 2017-09-14 MED ORDER — SODIUM CHLORIDE 0.9 % IV SOLN
Freq: Once | INTRAVENOUS | Status: AC
  Administered 2017-09-14: 11:00:00 via INTRAVENOUS

## 2017-09-14 MED ORDER — PROCHLORPERAZINE MALEATE 10 MG PO TABS
ORAL_TABLET | ORAL | Status: AC
  Filled 2017-09-14: qty 1

## 2017-09-14 MED ORDER — SODIUM CHLORIDE 0.9 % IV SOLN
510.0000 mg/m2 | Freq: Once | INTRAVENOUS | Status: AC
  Administered 2017-09-14: 1200 mg via INTRAVENOUS
  Filled 2017-09-14: qty 40

## 2017-09-14 MED ORDER — SODIUM CHLORIDE 0.9 % IV SOLN
1600.0000 mg | Freq: Once | INTRAVENOUS | Status: AC
  Administered 2017-09-14: 1600 mg via INTRAVENOUS
  Filled 2017-09-14: qty 64

## 2017-09-14 NOTE — Progress Notes (Signed)
Per Amy, RN per Freida Busman, NP okay to treat with ANC of 1.4.

## 2017-09-14 NOTE — Patient Instructions (Signed)
[facility] Discharge Instructions for Patients Receiving Chemotherapy  Today you received the following chemotherapy agents Alimta; Avastin  To help prevent nausea and vomiting after your treatment, we encourage you to take your nausea medication as directed  If you develop nausea and vomiting that is not controlled by your nausea medication, call the clinic.   BELOW ARE SYMPTOMS THAT SHOULD BE REPORTED IMMEDIATELY:  *FEVER GREATER THAN 100.5 F  *CHILLS WITH OR WITHOUT FEVER  NAUSEA AND VOMITING THAT IS NOT CONTROLLED WITH YOUR NAUSEA MEDICATION  *UNUSUAL SHORTNESS OF BREATH  *UNUSUAL BRUISING OR BLEEDING  TENDERNESS IN MOUTH AND THROAT WITH OR WITHOUT PRESENCE OF ULCERS  *URINARY PROBLEMS  *BOWEL PROBLEMS  UNUSUAL RASH Items with * indicate a potential emergency and should be followed up as soon as possible.  Feel free to call the clinic should you have any questions or concerns. The clinic phone number is [phone].  Please show the [facility] at check-in to the Emergency Department and triage nurse.

## 2017-09-14 NOTE — Assessment & Plan Note (Addendum)
This is a very pleasant 51 year old white male with stage IV non-small cell lung cancer, high-grade neuroendocrine carcinoma presented with locally advanced disease in the chest as well as solitary brain metastasis and 2 liver lesions. The patient is status post stereotactic radiotherapy to the solitary brain metastasis as well as short course of concurrent chemoradiation to the locally advanced disease in the chest. He recently completed 6 cycles of systemic chemotherapy with carboplatin, Alimta and Avastin and tolerated this treatment fairly well. The patient is currently on maintenance treatment with Alimta and Avastin status post 3 cycles.  The patient is tolerating his treatment well with no significant adverse effects.  The patient was seen with Dr. Julien Nordmann.  Restaging CT scan results were discussed with the patient and his wife.  Explained to the patient that he has a stable disease.  Recommend that he continue his course of maintenance Alimta with Avastin.  Lab results were reviewed and okay to proceed with treatment despite ANC of 1.4.  He will proceed with cycle 4 today as scheduled.  For the nosebleeds, recommend that he try to keep his sinuses hydrated.  Recommend that he use saline spray as needed.  He is to let us know if the bleeding worsens.  Patient will have a follow-up visit in 3 weeks for evaluation prior to cycle #5 of his treatment.  He was advised to call immediately if he has any concerning symptoms in the interval. The patient voices understanding of current disease status and treatment options and is in agreement with the current care plan. All questions were answered. The patient knows to call the clinic with any problems, questions or concerns. We can certainly see the patient much sooner if necessary.

## 2017-09-14 NOTE — Progress Notes (Signed)
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  PRIOR THERAPY: 1) Stereotactic radiotherapy to a solitary brain metastasis under the care of Dr. Lisbeth Renshaw on 01/25/2017. 2) Short course of concurrent chemoradiation with weekly carboplatin for AUC of 2 and paclitaxel 45 MG/M2 to the locally advanced disease in the chest. Status post 3 cycles. 3) Systemic chemotherapy with carboplatin for AUC of 5, Alimta 500 MG/M2 and Avastin 15 MG/KG every 3 weeks. First dose of 02/15/2017. Status post 6 cycles. Last dose 06/08/2017 with stable disease.  CURRENT THERAPY: Maintenance systemic chemotherapy with Alimta 500 MG/M2 in addition to Avastin 15 MG/KG every 3 weeks. First dose 07/13/2017.  Status post 3 cycles.  INTERVAL HISTORY: Calvin Wagner 51 y.o. male returns for routine follow-up visit accompanied by his wife.  The patient is feeling fine today with no specific complaints except for occasional nosebleeds which have been ongoing.  The patient denies fevers or chills.  Denies chest pain, shortness of breath, cough, hemoptysis.  Denies nausea, vomiting, constipation, diarrhea.  The patient continues to tolerate his treatment with Alimta and Avastin fairly well.  The patient is here for evaluation prior to starting cycle #4 and to review his recent restaging CT scan results.  MEDICAL HISTORY: Past Medical History:  Diagnosis Date  . Encounter for antineoplastic chemotherapy 12/31/2016  . GERD 11/08/2009   Qualifier: Diagnosis of  By: Ronnald Ramp MD, Arvid Right Goals of care, counseling/discussion 12/31/2016  . History of radiation  therapy 01/13/2017 - 02/02/2017   Site/dose:  Left lung: 30 Gy in 15 fractions  . HYPERTENSION 11/08/2009   Qualifier: Diagnosis of  By: Ronnald Ramp MD, Arvid Right.   . 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 Migraine 02/25/2012  . Pneumonia   . PONV (postoperative nausea and vomiting)   . Rotator cuff tear, left     ALLERGIES:  is allergic to no known allergies.  MEDICATIONS:  Current Outpatient Medications  Medication Sig Dispense Refill  . amLODipine (NORVASC) 5 MG tablet Take 5 mg by mouth daily.    Marland Kitchen losartan (COZAAR) 50 MG tablet Take 50 mg by mouth daily.    . budesonide-formoterol (SYMBICORT) 80-4.5 MCG/ACT inhaler Inhale 2 puffs into the lungs 2 (two) times daily. 1 Inhaler 5  . chlorpheniramine-HYDROcodone (TUSSIONEX) 10-8 MG/5ML SUER Take 5 mLs by mouth every 12 (twelve) hours as needed for cough. (Patient not taking: Reported on 08/03/2017) 473 mL 0  . dexamethasone (DECADRON) 4 MG tablet 4 mg by mouth twice a day the day before, day of and day after the chemotherapy 40 tablet 1  . enoxaparin (LOVENOX) 150 MG/ML injection Inject 1 mL (150 mg total) into the skin daily. 30 Syringe 2  . folic acid (FOLVITE) 1 MG tablet Take 1 tablet (1 mg total) by mouth daily. 30 tablet 4  . LORazepam (ATIVAN) 0.5 MG tablet 1 tablet po q 4-6 hours prn anxiety and one po 30 minutes prior to radiation or MRI 30 tablet 0  . Multiple Vitamin (MULTIVITAMIN WITH MINERALS) TABS tablet Take 1 tablet by mouth daily.    . pantoprazole (PROTONIX) 40 MG tablet Take 40  mg by mouth daily.  2  . prochlorperazine (COMPAZINE) 10 MG tablet TAKE 1 TABLET BY MOUTH  EVERY 6 HOURS AS NEEDED FOR NAUSEA OR VOMITING 30 tablet 0  . traMADol (ULTRAM) 50 MG tablet Take 1 tablet (50 mg total) every 12 (twelve) hours as needed by mouth. 30 tablet 0   No current facility-administered medications for this visit.     SURGICAL HISTORY:  Past Surgical History:  Procedure Laterality Date  . INGUINAL HERNIA  REPAIR    . SHOULDER ARTHROSCOPY WITH ROTATOR CUFF REPAIR Left 11/12/2016   Procedure: SHOULDER ARTHROSCOPY WITH ROTATOR CUFF REPAIR AND SUBACROMIAL DECOMPRESSION;  Surgeon: Tania Ade, MD;  Location: [facility];  Service: Orthopedics;  Laterality: Left;  SHOULDER ARTHROSCOPY WITH ROTATOR CUFF REPAIR AND SUBACROMIAL DECOMPRESSION  . TONSILLECTOMY    . TOTAL KNEE ARTHROPLASTY    . VIDEO BRONCHOSCOPY Bilateral 12/10/2016   Procedure: VIDEO BRONCHOSCOPY WITHOUT FLUORO;  Surgeon: Tanda Rockers, MD;  Location: [facility];  Service: Cardiopulmonary;  Laterality: Bilateral;    REVIEW OF SYSTEMS:   Review of Systems  Constitutional: Negative for appetite change, chills, fatigue, fever and unexpected weight change.  HENT:   Negative for mouth sores, sore throat and trouble swallowing.  Positive for intermittent nosebleeds.  Eyes: Negative for eye problems and icterus.  Respiratory: Negative for cough, hemoptysis, shortness of breath and wheezing.   Cardiovascular: Negative for chest pain and leg swelling.  Gastrointestinal: Negative for abdominal pain, constipation, diarrhea, nausea and vomiting.  Genitourinary: Negative for bladder incontinence, difficulty urinating, dysuria, frequency and hematuria.   Musculoskeletal: Negative for back pain, gait problem, neck pain and neck stiffness.  Skin: Negative for itching and rash.  Neurological: Negative for dizziness, extremity weakness, gait problem, headaches, light-headedness and seizures.  Hematological: Negative for adenopathy. Does not bruise/bleed easily.  Psychiatric/Behavioral: Negative for confusion, depression and sleep disturbance. The patient is not nervous/anxious.     PHYSICAL EXAMINATION:  Blood pressure 138/90, pulse 94, temperature 98.5 F (36.9 C), temperature source Oral, resp. rate 18, height 6\' 1"  (1.854 m), weight 239 lb 8 oz (108.6 kg), SpO2 100 %.  ECOG PERFORMANCE STATUS: 1 - Symptomatic but completely ambulatory  Physical  Exam  Constitutional: Oriented to person, place, and time and well-developed, well-nourished, and in no distress. No distress.  HENT:  Head: Normocephalic and atraumatic.  Mouth/Throat: Oropharynx is clear and moist. No oropharyngeal exudate.  Eyes: Conjunctivae are normal. Right eye exhibits no discharge. Left eye exhibits no discharge. No scleral icterus.  Neck: Normal range of motion. Neck supple.  Cardiovascular: Normal rate, regular rhythm, normal heart sounds and intact distal pulses.   Pulmonary/Chest: Effort normal and breath sounds normal. No respiratory distress. No wheezes. No rales.  Abdominal: Soft. Bowel sounds are normal. Exhibits no distension and no mass. There is no tenderness.  Musculoskeletal: Normal range of motion. Exhibits no edema.  Lymphadenopathy:    No cervical adenopathy.  Neurological: Alert and oriented to person, place, and time. Exhibits normal muscle tone. Gait normal. Coordination normal.  Skin: Skin is warm and dry. No rash noted. Not diaphoretic. No erythema. No pallor.  Psychiatric: Mood, memory and judgment normal.  Vitals reviewed.  LABORATORY DATA: Lab Results  Component Value Date   WBC 2.6 (L) 09/14/2017   HGB 14.0 09/14/2017   HCT 41.7 09/14/2017   MCV 112.8 (H) 09/14/2017   PLT 198 09/14/2017      Chemistry      Component Value Date/Time   NA 137 09/14/2017 0933  K 3.8 09/14/2017 0933   CL 103 11/26/2016 0438   CO2 24 09/14/2017 0933   BUN 9.9 09/14/2017 0933   CREATININE 1.1 09/14/2017 0933      Component Value Date/Time   CALCIUM 9.2 09/14/2017 0933   ALKPHOS 75 09/14/2017 0933   AST 27 09/14/2017 0933   ALT 40 09/14/2017 0933   BILITOT 0.45 09/14/2017 0933       RADIOGRAPHIC STUDIES:  Ct Chest W Contrast  Result Date: 09/11/2017 CLINICAL DATA:  Lung carcinoma with brain metastasis in liver metastasis. Diagnosis March 2013. Radiation complete. Chemotherapy ongoing. EXAM: CT CHEST, ABDOMEN, AND PELVIS WITH CONTRAST  TECHNIQUE: Multidetector CT imaging of the chest, abdomen and pelvis was performed following the standard protocol during bolus administration of intravenous contrast. CONTRAST:  142mL ISOVUE-300 IOPAMIDOL (ISOVUE-300) INJECTION 61% COMPARISON:  CT 07/02/2017, PET-CT 12/27/2016 FINDINGS: CT CHEST FINDINGS Cardiovascular: No significant vascular findings. Normal heart size. No pericardial effusion. Mediastinum/Nodes: No axillary or supraclavicular adenopathy. No mediastinal hilar adenopathy. No pericardial fluid. Esophagus normal. Lungs/Pleura: LEFT lower lobe pulmonary nodule measures 7 mm x 7 mm (image 131, series 4) compared with 8 mm x 6 mm for no interval change. LEFT infrahilar linear fibrosis and bronchiectasis is mildly increased from prior consistent with radiation fibrosis. No new pulmonary nodule. Musculoskeletal: No aggressive osseous lesion. CT ABDOMEN AND PELVIS FINDINGS Hepatobiliary: No focal hepatic lesion. Particular attention directed to the RIGHT hepatic lobe along the gallbladder fossa. Pancreas: Pancreas is normal. No ductal dilatation. No pancreatic inflammation. Spleen: Normal spleen Adrenals/urinary tract: Adrenal glands and kidneys are normal. The ureters and bladder normal. Stomach/Bowel: Stomach, small bowel, appendix, and cecum are normal. The colon and rectosigmoid colon are normal. Vascular/Lymphatic: Abdominal aorta is normal caliber. There is no retroperitoneal or periportal lymphadenopathy. No pelvic lymphadenopathy. Reproductive: Prostate normal Other: No peritoneal carcinomatosis. Bilateral inguinal hernia repair. Musculoskeletal: No aggressive osseous lesion. IMPRESSION: Chest Impression: 1. Stable LEFT lower lobe pulmonary nodule. 2. Mild progression of radiation change in the LEFT hilum. 3. No evidence of lung cancer progression in the thorax. Abdomen / Pelvis Impression: 1. No evidence of metastatic disease in the abdomen pelvis. 2. Particular attention directed to the liver  with no evidence recurrence. Electronically Signed   By: Suzy Bouchard M.D.   On: 09/11/2017 07:57   Mr Jeri Cos ML Contrast  Result Date: 09/11/2017 CLINICAL DATA:  CNS limited metastatic lesions. History of lung cancer with brain metastases. Status post radiotherapy May 2018. EXAM: MRI HEAD WITHOUT AND WITH CONTRAST TECHNIQUE: Multiplanar, multiecho pulse sequences of the brain and surrounding structures were obtained without and with intravenous contrast. CONTRAST:  28mL MULTIHANCE GADOBENATE DIMEGLUMINE 529 MG/ML IV SOLN COMPARISON:  05/04/2017 FINDINGS: Brain: Treated left inferior frontal lesion is nonenhancing with a tiny cystic remnant. No edema. No new lesion is seen. The remainder the brain is negative. No infarct, hemorrhage, hydrocephalus, or extra-axial collection. Vascular: Negative Skull and upper cervical spine: No evidence of bone lesion. Sinuses/Orbits: Negative adnexae IMPRESSION: No evidence of disease. The treated left inferior frontal lesion is nonenhancing and there is no new metastasis. Electronically Signed   By: Monte Fantasia M.D.   On: 09/11/2017 20:32   Ct Abdomen Pelvis W Contrast  Result Date: 09/11/2017 CLINICAL DATA:  Lung carcinoma with brain metastasis in liver metastasis. Diagnosis March 2013. Radiation complete. Chemotherapy ongoing. EXAM: CT CHEST, ABDOMEN, AND PELVIS WITH CONTRAST TECHNIQUE: Multidetector CT imaging of the chest, abdomen and pelvis was performed following the standard protocol during bolus administration of  intravenous contrast. CONTRAST:  14mL ISOVUE-300 IOPAMIDOL (ISOVUE-300) INJECTION 61% COMPARISON:  CT 07/02/2017, PET-CT 12/27/2016 FINDINGS: CT CHEST FINDINGS Cardiovascular: No significant vascular findings. Normal heart size. No pericardial effusion. Mediastinum/Nodes: No axillary or supraclavicular adenopathy. No mediastinal hilar adenopathy. No pericardial fluid. Esophagus normal. Lungs/Pleura: LEFT lower lobe pulmonary nodule measures 7 mm  x 7 mm (image 131, series 4) compared with 8 mm x 6 mm for no interval change. LEFT infrahilar linear fibrosis and bronchiectasis is mildly increased from prior consistent with radiation fibrosis. No new pulmonary nodule. Musculoskeletal: No aggressive osseous lesion. CT ABDOMEN AND PELVIS FINDINGS Hepatobiliary: No focal hepatic lesion. Particular attention directed to the RIGHT hepatic lobe along the gallbladder fossa. Pancreas: Pancreas is normal. No ductal dilatation. No pancreatic inflammation. Spleen: Normal spleen Adrenals/urinary tract: Adrenal glands and kidneys are normal. The ureters and bladder normal. Stomach/Bowel: Stomach, small bowel, appendix, and cecum are normal. The colon and rectosigmoid colon are normal. Vascular/Lymphatic: Abdominal aorta is normal caliber. There is no retroperitoneal or periportal lymphadenopathy. No pelvic lymphadenopathy. Reproductive: Prostate normal Other: No peritoneal carcinomatosis. Bilateral inguinal hernia repair. Musculoskeletal: No aggressive osseous lesion. IMPRESSION: Chest Impression: 1. Stable LEFT lower lobe pulmonary nodule. 2. Mild progression of radiation change in the LEFT hilum. 3. No evidence of lung cancer progression in the thorax. Abdomen / Pelvis Impression: 1. No evidence of metastatic disease in the abdomen pelvis. 2. Particular attention directed to the liver with no evidence recurrence. Electronically Signed   By: Suzy Bouchard M.D.   On: 09/11/2017 07:57     ASSESSMENT/PLAN:  Large cell carcinoma of left lung, stage 4 ([facility]) This is a very pleasant 51 year old white male with stage IV non-small cell lung cancer, high-grade neuroendocrine carcinoma presented with locally advanced disease in the chest as well as solitary brain metastasis and 2 liver lesions. The patient is status post stereotactic radiotherapy to the solitary brain metastasis as well as short course of concurrent chemoradiation to the locally advanced disease in the  chest. He recently completed 6 cycles of systemic chemotherapy with carboplatin, Alimta and Avastin and tolerated this treatment fairly well. The patient is currently on maintenance treatment with Alimta and Avastin status post 3 cycles.  The patient is tolerating his treatment well with no significant adverse effects.  The patient was seen with Dr. Julien Nordmann.  Restaging CT scan results were discussed with the patient and his wife.  Explained to the patient that he has a stable disease.  Recommend that he continue his course of maintenance Alimta with Avastin.  Lab results were reviewed and okay to proceed with treatment despite ANC of 1.4.  He will proceed with cycle 4 today as scheduled.  For the nosebleeds, recommend that he try to keep his sinuses hydrated.  Recommend that he use saline spray as needed.  He is to let us know if the bleeding worsens.  Patient will have a follow-up visit in 3 weeks for evaluation prior to cycle #5 of his treatment.  He was advised to call immediately if he has any concerning symptoms in the interval. The patient voices understanding of current disease status and treatment options and is in agreement with the current care plan. All questions were answered. The patient knows to call the clinic with any problems, questions or concerns. We can certainly see the patient much sooner if necessary.  No orders of the defined types were placed in this encounter.   Mikey Bussing, DNP, AGPCNP-BC, AOCNP 09/14/17  ADDENDUM: Hematology/Oncology Attending:  I had a face-to-face encounter with the patient today.  I recommended his care plan.  This is a very pleasant 51 years old white male with metastatic non-small cell lung cancer, high-grade neuroendocrine carcinoma status post induction systemic chemotherapy with carboplatin, Alimta and Avastin and he is currently on maintenance treatment with Alimta and Avastin status post 3 cycles.  The patient has been tolerating his  treatment fairly well with no concerning complaints except for occasional epistaxis. He denied having any current weight loss or night sweats.  He has no chest pain or shortness breath.  He had a repeat CT scan of the chest and abdomen performed recently.  I personally and independently reviewed the scans and discussed the results with the patient and his wife today.  He has a scan showed no evidence for disease progression. I recommended for the patient to continue his current treatment with maintenance Alimta and Avastin and he would proceed with cycle #4 today. The patient and his wife had several questions about his current disease status and prognosis and I answered them completely to their satisfaction. Will come back for follow-up visit in 3 weeks for evaluation before starting cycle #5. He was advised to call immediately if he has any concerning symptoms in the interval.  Disclaimer: This note was dictated with voice recognition software. Similar sounding words can inadvertently be transcribed and may be missed upon review. Eilleen Kempf, MD 09/14/17

## 2017-10-06 ENCOUNTER — Other Ambulatory Visit: Payer: 59

## 2017-10-06 ENCOUNTER — Ambulatory Visit: Payer: 59

## 2017-10-06 ENCOUNTER — Ambulatory Visit: Payer: 59 | Admitting: Internal Medicine

## 2017-10-08 ENCOUNTER — Ambulatory Visit (HOSPITAL_BASED_OUTPATIENT_CLINIC_OR_DEPARTMENT_OTHER): Payer: 59

## 2017-10-08 ENCOUNTER — Encounter: Payer: Self-pay | Admitting: *Deleted

## 2017-10-08 ENCOUNTER — Ambulatory Visit (HOSPITAL_BASED_OUTPATIENT_CLINIC_OR_DEPARTMENT_OTHER): Payer: 59 | Admitting: Oncology

## 2017-10-08 ENCOUNTER — Telehealth: Payer: Self-pay | Admitting: Oncology

## 2017-10-08 ENCOUNTER — Encounter: Payer: Self-pay | Admitting: Oncology

## 2017-10-08 ENCOUNTER — Telehealth: Payer: Self-pay | Admitting: *Deleted

## 2017-10-08 ENCOUNTER — Other Ambulatory Visit (HOSPITAL_BASED_OUTPATIENT_CLINIC_OR_DEPARTMENT_OTHER): Payer: 59

## 2017-10-08 VITALS — BP 149/90

## 2017-10-08 VITALS — BP 143/92 | HR 107 | Temp 97.9°F | Resp 18 | Ht 73.0 in | Wt 239.2 lb

## 2017-10-08 DIAGNOSIS — C7B8 Other secondary neuroendocrine tumors: Secondary | ICD-10-CM

## 2017-10-08 DIAGNOSIS — C7A1 Malignant poorly differentiated neuroendocrine tumors: Secondary | ICD-10-CM

## 2017-10-08 DIAGNOSIS — Z5111 Encounter for antineoplastic chemotherapy: Secondary | ICD-10-CM

## 2017-10-08 DIAGNOSIS — Z5112 Encounter for antineoplastic immunotherapy: Secondary | ICD-10-CM | POA: Diagnosis not present

## 2017-10-08 DIAGNOSIS — Z923 Personal history of irradiation: Secondary | ICD-10-CM | POA: Diagnosis not present

## 2017-10-08 DIAGNOSIS — C3492 Malignant neoplasm of unspecified part of left bronchus or lung: Secondary | ICD-10-CM

## 2017-10-08 DIAGNOSIS — R112 Nausea with vomiting, unspecified: Secondary | ICD-10-CM

## 2017-10-08 LAB — CBC WITH DIFFERENTIAL/PLATELET
BASO%: 0.3 % (ref 0.0–2.0)
Basophils Absolute: 0 10*3/uL (ref 0.0–0.1)
EOS%: 5.2 % (ref 0.0–7.0)
Eosinophils Absolute: 0.2 10*3/uL (ref 0.0–0.5)
HCT: 43.6 % (ref 38.4–49.9)
HGB: 14.4 g/dL (ref 13.0–17.1)
LYMPH%: 24.7 % (ref 14.0–49.0)
MCH: 37.2 pg — ABNORMAL HIGH (ref 27.2–33.4)
MCHC: 33 g/dL (ref 32.0–36.0)
MCV: 112.7 fL — ABNORMAL HIGH (ref 79.3–98.0)
MONO#: 0.6 10*3/uL (ref 0.1–0.9)
MONO%: 19.1 % — ABNORMAL HIGH (ref 0.0–14.0)
NEUT#: 1.6 10*3/uL (ref 1.5–6.5)
NEUT%: 50.7 % (ref 39.0–75.0)
Platelets: 198 10*3/uL (ref 140–400)
RBC: 3.87 10*6/uL — ABNORMAL LOW (ref 4.20–5.82)
RDW: 14.2 % (ref 11.0–14.6)
WBC: 3.2 10*3/uL — ABNORMAL LOW (ref 4.0–10.3)
lymph#: 0.8 10*3/uL — ABNORMAL LOW (ref 0.9–3.3)

## 2017-10-08 LAB — COMPREHENSIVE METABOLIC PANEL
ALT: 24 U/L (ref 0–55)
AST: 22 U/L (ref 5–34)
Albumin: 3.6 g/dL (ref 3.5–5.0)
Alkaline Phosphatase: 88 U/L (ref 40–150)
Anion Gap: 10 mEq/L (ref 3–11)
BUN: 11.7 mg/dL (ref 7.0–26.0)
CO2: 24 mEq/L (ref 22–29)
Calcium: 9.5 mg/dL (ref 8.4–10.4)
Chloride: 103 mEq/L (ref 98–109)
Creatinine: 1.2 mg/dL (ref 0.7–1.3)
EGFR: >60 mL/min/{1.73_m2} (ref >60)
Glucose: 138 mg/dl (ref 70–140)
Potassium: 4.1 mEq/L (ref 3.5–5.1)
Sodium: 138 mEq/L (ref 136–145)
Total Bilirubin: 0.42 mg/dL (ref 0.20–1.20)
Total Protein: 7.5 g/dL (ref 6.4–8.3)

## 2017-10-08 LAB — UA PROTEIN, DIPSTICK - CHCC: Protein, ur: <30 mg/dL

## 2017-10-08 MED ORDER — PROCHLORPERAZINE MALEATE 10 MG PO TABS: ORAL_TABLET | ORAL | 0 refills | Status: DC

## 2017-10-08 MED ORDER — LORAZEPAM 0.5 MG PO TABS: ORAL_TABLET | ORAL | 0 refills | Status: DC

## 2017-10-08 MED ORDER — SODIUM CHLORIDE 0.9 % IV SOLN
510.0000 mg/m2 | Freq: Once | INTRAVENOUS | Status: AC
  Administered 2017-10-08: 1200 mg via INTRAVENOUS
  Filled 2017-10-08: qty 40

## 2017-10-08 MED ORDER — SODIUM CHLORIDE 0.9 % IV SOLN
Freq: Once | INTRAVENOUS | Status: AC
  Administered 2017-10-08: 10:00:00 via INTRAVENOUS

## 2017-10-08 MED ORDER — PROCHLORPERAZINE MALEATE 10 MG PO TABS
10.0000 mg | ORAL_TABLET | Freq: Once | ORAL | Status: AC
  Administered 2017-10-08: 10 mg via ORAL

## 2017-10-08 MED ORDER — SODIUM CHLORIDE 0.9 % IV SOLN
15.2000 mg/kg | Freq: Once | INTRAVENOUS | Status: AC
  Administered 2017-10-08: 1600 mg via INTRAVENOUS
  Filled 2017-10-08: qty 64

## 2017-10-08 MED ORDER — PROCHLORPERAZINE MALEATE 10 MG PO TABS
ORAL_TABLET | ORAL | Status: AC
  Filled 2017-10-08: qty 1

## 2017-10-08 NOTE — Telephone Encounter (Signed)
Refill sent to pt pharmacy

## 2017-10-08 NOTE — Progress Notes (Signed)
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  PRIOR THERAPY: 1) Stereotactic radiotherapy to a solitary brain metastasis under the care of Dr. Lisbeth Renshaw on 01/25/2017. 2) Short course of concurrent chemoradiation with weekly carboplatin for AUC of 2 and paclitaxel 45 MG/M2 to the locally advanced disease in the chest. Status post 3 cycles. 3) Systemic chemotherapy with carboplatin for AUC of 5, Alimta 500 MG/M2 and Avastin 15 MG/KG every 3 weeks. First dose of 02/15/2017. Status post 6 cycles. Last dose 06/08/2017 with stable disease.  CURRENT THERAPY: Maintenance systemic chemotherapy with Alimta 500 MG/M2 in addition to Avastin 15 MG/KG every 3 weeks. First dose 07/13/2017.Status post 4 cycles.  INTERVAL HISTORY: Calvin Wagner 51 y.o. male returns for routine follow-up visit accompanied by his wife.  The patient is feeling fine today and has no specific complaints.  He denies fevers and chills.  Denies chest pain, shortness breath, cough, hemoptysis.  Denies nausea, vomiting, constipation, diarrhea.  The patient continues to tolerate his treatment with Alimta and Avastin fairly well.  The patient is here for evaluation prior to starting cycle #5 of his treatment.  MEDICAL HISTORY: Past Medical History:  Diagnosis Date  . Encounter for antineoplastic chemotherapy 12/31/2016  . GERD 11/08/2009   Qualifier: Diagnosis of  By: Ronnald Ramp MD, Arvid Right Goals of care, counseling/discussion 12/31/2016  . History of radiation therapy 01/13/2017 - 02/02/2017   Site/dose:  Left lung: 30 Gy in 15 fractions  . HYPERTENSION  11/08/2009   Qualifier: Diagnosis of  By: Ronnald Ramp MD, Arvid Right.   . Large cell carcinoma of left lung, stage 4 ([place]) 12/31/2016  . Migraine 02/25/2012  . Pneumonia   . PONV (postoperative nausea and vomiting)   . Rotator cuff tear, left     ALLERGIES:  is allergic to no known allergies.  MEDICATIONS:  Current Outpatient Medications  Medication Sig Dispense Refill  . amLODipine (NORVASC) 5 MG tablet Take 5 mg by mouth daily.    . budesonide-formoterol (SYMBICORT) 80-4.5 MCG/ACT inhaler Inhale 2 puffs into the lungs 2 (two) times daily. 1 Inhaler 5  . chlorpheniramine-HYDROcodone (TUSSIONEX) 10-8 MG/5ML SUER Take 5 mLs by mouth every 12 (twelve) hours as needed for cough. 473 mL 0  . dexamethasone (DECADRON) 4 MG tablet 4 mg by mouth twice a day the day before, day of and day after the chemotherapy 40 tablet 1  . folic acid (FOLVITE) 1 MG tablet Take 1 tablet (1 mg total) by mouth daily. 30 tablet 4  . losartan (COZAAR) 50 MG tablet Take 50 mg by mouth daily.    . Multiple Vitamin (MULTIVITAMIN WITH MINERALS) TABS tablet Take 1 tablet by mouth daily.    . pantoprazole (PROTONIX) 40 MG tablet Take 40 mg by mouth daily.  2  . traMADol (ULTRAM) 50 MG tablet Take 1 tablet (50 mg total) every 12 (twelve) hours as needed by mouth. 30 tablet 0  . enoxaparin (LOVENOX) 150 MG/ML injection Inject 1 mL (150 mg total) into the skin daily. 30 Syringe 2  . LORazepam (ATIVAN) 0.5 MG tablet 1 tablet po q 4-6 hours prn anxiety and one po 30 minutes prior  to radiation or MRI 30 tablet 0  . prochlorperazine (COMPAZINE) 10 MG tablet TAKE 1 TABLET BY MOUTH  EVERY 6 HOURS AS NEEDED FOR NAUSEA OR VOMITING 30 tablet 0   No current facility-administered medications for this visit.     SURGICAL HISTORY:  Past Surgical History:  Procedure Laterality Date  . INGUINAL HERNIA REPAIR    . SHOULDER ARTHROSCOPY WITH ROTATOR CUFF REPAIR Left 11/12/2016   Procedure: SHOULDER ARTHROSCOPY WITH ROTATOR CUFF REPAIR AND  SUBACROMIAL DECOMPRESSION;  Surgeon: Tania Ade, MD;  Location: [facility];  Service: Orthopedics;  Laterality: Left;  SHOULDER ARTHROSCOPY WITH ROTATOR CUFF REPAIR AND SUBACROMIAL DECOMPRESSION  . TONSILLECTOMY    . TOTAL KNEE ARTHROPLASTY    . VIDEO BRONCHOSCOPY Bilateral 12/10/2016   Procedure: VIDEO BRONCHOSCOPY WITHOUT FLUORO;  Surgeon: Tanda Rockers, MD;  Location: [facility];  Service: Cardiopulmonary;  Laterality: Bilateral;    REVIEW OF SYSTEMS:   Review of Systems  Constitutional: Negative for appetite change, chills, fatigue, fever and unexpected weight change.  HENT:   Negative for mouth sores, nosebleeds, sore throat and trouble swallowing.   Eyes: Negative for eye problems and icterus.  Respiratory: Negative for cough, hemoptysis, shortness of breath and wheezing.   Cardiovascular: Negative for chest pain and leg swelling.  Gastrointestinal: Negative for abdominal pain, constipation, diarrhea, nausea and vomiting.  Genitourinary: Negative for bladder incontinence, difficulty urinating, dysuria, frequency and hematuria.   Musculoskeletal: Negative for back pain, gait problem, neck pain and neck stiffness.  Skin: Negative for itching and rash.  Neurological: Negative for dizziness, extremity weakness, gait problem, headaches, light-headedness and seizures.  Hematological: Negative for adenopathy. Does not bruise/bleed easily.  Psychiatric/Behavioral: Negative for confusion, depression and sleep disturbance. The patient is not nervous/anxious.     PHYSICAL EXAMINATION:  Blood pressure (!) 143/92, pulse (!) 107, temperature 97.9 F (36.6 C), temperature source Oral, resp. rate 18, height 6\' 1"  (1.854 m), weight 239 lb 3.2 oz (108.5 kg), SpO2 100 %.  ECOG PERFORMANCE STATUS: 1 - Symptomatic but completely ambulatory  Physical Exam  Constitutional: Oriented to person, place, and time and well-developed, well-nourished, and in no distress. No distress.  HENT:  Head:  Normocephalic and atraumatic.  Mouth/Throat: Oropharynx is clear and moist. No oropharyngeal exudate.  Eyes: Conjunctivae are normal. Right eye exhibits no discharge. Left eye exhibits no discharge. No scleral icterus.  Neck: Normal range of motion. Neck supple.  Cardiovascular: Normal rate, regular rhythm, normal heart sounds and intact distal pulses.   Pulmonary/Chest: Effort normal and breath sounds normal. No respiratory distress. No wheezes. No rales.  Abdominal: Soft. Bowel sounds are normal. Exhibits no distension and no mass. There is no tenderness.  Musculoskeletal: Normal range of motion. Exhibits no edema.  Lymphadenopathy:    No cervical adenopathy.  Neurological: Alert and oriented to person, place, and time. Exhibits normal muscle tone. Gait normal. Coordination normal.  Skin: Skin is warm and dry. No rash noted. Not diaphoretic. No erythema. No pallor.  Psychiatric: Mood, memory and judgment normal.  Vitals reviewed.  LABORATORY DATA: Lab Results  Component Value Date   WBC 3.2 (L) 10/08/2017   HGB 14.4 10/08/2017   HCT 43.6 10/08/2017   MCV 112.7 (H) 10/08/2017   PLT 198 10/08/2017      Chemistry      Component Value Date/Time   NA 138 10/08/2017 0907   K 4.1 10/08/2017 0907   CL 103 11/26/2016 0438   CO2 24 10/08/2017 0907   BUN 11.7 10/08/2017 7341  CREATININE 1.2 10/08/2017 0907      Component Value Date/Time   CALCIUM 9.5 10/08/2017 0907   ALKPHOS 88 10/08/2017 0907   AST 22 10/08/2017 0907   ALT 24 10/08/2017 0907   BILITOT 0.42 10/08/2017 8413       RADIOGRAPHIC STUDIES:  Ct Chest W Contrast  Result Date: 09/11/2017 CLINICAL DATA:  Lung carcinoma with brain metastasis in liver metastasis. Diagnosis March 2013. Radiation complete. Chemotherapy ongoing. EXAM: CT CHEST, ABDOMEN, AND PELVIS WITH CONTRAST TECHNIQUE: Multidetector CT imaging of the chest, abdomen and pelvis was performed following the standard protocol during bolus administration of  intravenous contrast. CONTRAST:  125mL ISOVUE-300 IOPAMIDOL (ISOVUE-300) INJECTION 61% COMPARISON:  CT 07/02/2017, PET-CT 12/27/2016 FINDINGS: CT CHEST FINDINGS Cardiovascular: No significant vascular findings. Normal heart size. No pericardial effusion. Mediastinum/Nodes: No axillary or supraclavicular adenopathy. No mediastinal hilar adenopathy. No pericardial fluid. Esophagus normal. Lungs/Pleura: LEFT lower lobe pulmonary nodule measures 7 mm x 7 mm (image 131, series 4) compared with 8 mm x 6 mm for no interval change. LEFT infrahilar linear fibrosis and bronchiectasis is mildly increased from prior consistent with radiation fibrosis. No new pulmonary nodule. Musculoskeletal: No aggressive osseous lesion. CT ABDOMEN AND PELVIS FINDINGS Hepatobiliary: No focal hepatic lesion. Particular attention directed to the RIGHT hepatic lobe along the gallbladder fossa. Pancreas: Pancreas is normal. No ductal dilatation. No pancreatic inflammation. Spleen: Normal spleen Adrenals/urinary tract: Adrenal glands and kidneys are normal. The ureters and bladder normal. Stomach/Bowel: Stomach, small bowel, appendix, and cecum are normal. The colon and rectosigmoid colon are normal. Vascular/Lymphatic: Abdominal aorta is normal caliber. There is no retroperitoneal or periportal lymphadenopathy. No pelvic lymphadenopathy. Reproductive: Prostate normal Other: No peritoneal carcinomatosis. Bilateral inguinal hernia repair. Musculoskeletal: No aggressive osseous lesion. IMPRESSION: Chest Impression: 1. Stable LEFT lower lobe pulmonary nodule. 2. Mild progression of radiation change in the LEFT hilum. 3. No evidence of lung cancer progression in the thorax. Abdomen / Pelvis Impression: 1. No evidence of metastatic disease in the abdomen pelvis. 2. Particular attention directed to the liver with no evidence recurrence. Electronically Signed   By: Suzy Bouchard M.D.   On: 09/11/2017 07:57   Mr Jeri Cos KG Contrast  Result Date:  09/11/2017 CLINICAL DATA:  CNS limited metastatic lesions. History of lung cancer with brain metastases. Status post radiotherapy May 2018. EXAM: MRI HEAD WITHOUT AND WITH CONTRAST TECHNIQUE: Multiplanar, multiecho pulse sequences of the brain and surrounding structures were obtained without and with intravenous contrast. CONTRAST:  63mL MULTIHANCE GADOBENATE DIMEGLUMINE 529 MG/ML IV SOLN COMPARISON:  05/04/2017 FINDINGS: Brain: Treated left inferior frontal lesion is nonenhancing with a tiny cystic remnant. No edema. No new lesion is seen. The remainder the brain is negative. No infarct, hemorrhage, hydrocephalus, or extra-axial collection. Vascular: Negative Skull and upper cervical spine: No evidence of bone lesion. Sinuses/Orbits: Negative adnexae IMPRESSION: No evidence of disease. The treated left inferior frontal lesion is nonenhancing and there is no new metastasis. Electronically Signed   By: Monte Fantasia M.D.   On: 09/11/2017 20:32   Ct Abdomen Pelvis W Contrast  Result Date: 09/11/2017 CLINICAL DATA:  Lung carcinoma with brain metastasis in liver metastasis. Diagnosis March 2013. Radiation complete. Chemotherapy ongoing. EXAM: CT CHEST, ABDOMEN, AND PELVIS WITH CONTRAST TECHNIQUE: Multidetector CT imaging of the chest, abdomen and pelvis was performed following the standard protocol during bolus administration of intravenous contrast. CONTRAST:  150mL ISOVUE-300 IOPAMIDOL (ISOVUE-300) INJECTION 61% COMPARISON:  CT 07/02/2017, PET-CT 12/27/2016 FINDINGS: CT CHEST FINDINGS Cardiovascular: No significant vascular  findings. Normal heart size. No pericardial effusion. Mediastinum/Nodes: No axillary or supraclavicular adenopathy. No mediastinal hilar adenopathy. No pericardial fluid. Esophagus normal. Lungs/Pleura: LEFT lower lobe pulmonary nodule measures 7 mm x 7 mm (image 131, series 4) compared with 8 mm x 6 mm for no interval change. LEFT infrahilar linear fibrosis and bronchiectasis is mildly  increased from prior consistent with radiation fibrosis. No new pulmonary nodule. Musculoskeletal: No aggressive osseous lesion. CT ABDOMEN AND PELVIS FINDINGS Hepatobiliary: No focal hepatic lesion. Particular attention directed to the RIGHT hepatic lobe along the gallbladder fossa. Pancreas: Pancreas is normal. No ductal dilatation. No pancreatic inflammation. Spleen: Normal spleen Adrenals/urinary tract: Adrenal glands and kidneys are normal. The ureters and bladder normal. Stomach/Bowel: Stomach, small bowel, appendix, and cecum are normal. The colon and rectosigmoid colon are normal. Vascular/Lymphatic: Abdominal aorta is normal caliber. There is no retroperitoneal or periportal lymphadenopathy. No pelvic lymphadenopathy. Reproductive: Prostate normal Other: No peritoneal carcinomatosis. Bilateral inguinal hernia repair. Musculoskeletal: No aggressive osseous lesion. IMPRESSION: Chest Impression: 1. Stable LEFT lower lobe pulmonary nodule. 2. Mild progression of radiation change in the LEFT hilum. 3. No evidence of lung cancer progression in the thorax. Abdomen / Pelvis Impression: 1. No evidence of metastatic disease in the abdomen pelvis. 2. Particular attention directed to the liver with no evidence recurrence. Electronically Signed   By: Suzy Bouchard M.D.   On: 09/11/2017 07:57     ASSESSMENT/PLAN:  Large cell carcinoma of left lung, stage 4 ([facility]) This is a very pleasant 51 year old white male with stage IV non-small cell lung cancer, high-grade neuroendocrine carcinoma presented with locally advanced disease in the chest as well as solitary brain metastasis and 2 liver lesions. The patient is status post stereotactic radiotherapy to the solitary brain metastasis as well as short course of concurrent chemoradiation to the locally advanced disease in the chest. He recently completed 6 cycles of systemic chemotherapy with carboplatin, Alimta and Avastin and tolerated this treatment fairly  well. The patient is currently on maintenance treatment with Alimta and Avastin status post4cycles. The patient is tolerating his treatment well with no significant adverse effects. Recommend that he continue his course of maintenance Alimta with Avastin. He will proceed with cycle 5 today as scheduled.  The patient will have a follow-up visit in 3 weeks for evaluation prior to cycle #6 of his treatment.  He was advised to call immediately if he has any concerning symptoms in the interval. The patient voices understanding of current disease status and treatment options and is in agreement with the current care plan. All questions were answered. The patient knows to call the clinic with any problems, questions or concerns. We can certainly see the patient much sooner if necessary.  No orders of the defined types were placed in this encounter.   Mikey Bussing, DNP, AGPCNP-BC, AOCNP 10/08/17

## 2017-10-08 NOTE — Patient Instructions (Signed)
Cal[facility] Wagner Discharge Instructions for Patients Receiving Chemotherapy  Today you received the following chemotherapy agents:  Avastin (bevacizumab), Alimta (pemetrexed)  To help prevent nausea and vomiting after your treatment, we encourage you to take your nausea medication as prescribed.   If you develop nausea and vomiting that is not controlled by your nausea medication, call the clinic.   BELOW ARE SYMPTOMS THAT SHOULD BE REPORTED IMMEDIATELY:  *FEVER GREATER THAN 100.5 F  *CHILLS WITH OR WITHOUT FEVER  NAUSEA AND VOMITING THAT IS NOT CONTROLLED WITH YOUR NAUSEA MEDICATION  *UNUSUAL SHORTNESS OF BREATH  *UNUSUAL BRUISING OR BLEEDING  TENDERNESS IN MOUTH AND THROAT WITH OR WITHOUT PRESENCE OF ULCERS  *URINARY PROBLEMS  *BOWEL PROBLEMS  UNUSUAL RASH Items with * indicate a potential emergency and should be followed up as soon as possible.  Feel free to call the clinic should you have any questions or concerns. The clinic phone number is [phone].  Please show the [facility] at check-in to the Emergency Department and triage nurse.

## 2017-10-08 NOTE — Telephone Encounter (Signed)
No 12/28 los.

## 2017-10-08 NOTE — Assessment & Plan Note (Signed)
This is a very pleasant 51 year old white male with stage IV non-small cell lung cancer, high-grade neuroendocrine carcinoma presented with locally advanced disease in the chest as well as solitary brain metastasis and 2 liver lesions. The patient is status post stereotactic radiotherapy to the solitary brain metastasis as well as short course of concurrent chemoradiation to the locally advanced disease in the chest. He recently completed 6 cycles of systemic chemotherapy with carboplatin, Alimta and Avastin and tolerated this treatment fairly well. The patient is currently on maintenance treatment with Alimta and Avastin status post4cycles. The patient is tolerating his treatment well with no significant adverse effects. Recommend that he continue his course of maintenance Alimta with Avastin. He will proceed with cycle 5 today as scheduled.  The patient will have a follow-up visit in 3 weeks for evaluation prior to cycle #6 of his treatment.  He was advised to call immediately if he has any concerning symptoms in the interval. The patient voices understanding of current disease status and treatment options and is in agreement with the current care plan. All questions were answered. The patient knows to call the clinic with any problems, questions or concerns. We can certainly see the patient much sooner if necessary.

## 2017-10-15 ENCOUNTER — Ambulatory Visit (HOSPITAL_BASED_OUTPATIENT_CLINIC_OR_DEPARTMENT_OTHER): Payer: 59 | Admitting: Medical

## 2017-10-15 ENCOUNTER — Telehealth: Payer: Self-pay | Admitting: Medical Oncology

## 2017-10-15 DIAGNOSIS — C3492 Malignant neoplasm of unspecified part of left bronchus or lung: Secondary | ICD-10-CM

## 2017-10-15 NOTE — Progress Notes (Signed)
No show

## 2017-10-15 NOTE — Telephone Encounter (Signed)
I left message for Baker Janus to return call to see if pt can come today at 1300 for [facility].

## 2017-10-22 ENCOUNTER — Telehealth: Payer: Self-pay | Admitting: Internal Medicine

## 2017-10-22 NOTE — Telephone Encounter (Signed)
10/22/17 @ 3:45 pm called [phone] left vm to inform patient his Disability forms were completed and ready for pick-up per his request.

## 2017-10-26 ENCOUNTER — Other Ambulatory Visit: Payer: Self-pay | Admitting: Radiation Therapy

## 2017-10-26 ENCOUNTER — Inpatient Hospital Stay: Payer: 59

## 2017-10-26 ENCOUNTER — Other Ambulatory Visit: Payer: Self-pay | Admitting: *Deleted

## 2017-10-26 ENCOUNTER — Inpatient Hospital Stay: Payer: 59 | Attending: Internal Medicine | Admitting: Internal Medicine

## 2017-10-26 ENCOUNTER — Encounter: Payer: Self-pay | Admitting: Internal Medicine

## 2017-10-26 ENCOUNTER — Telehealth: Payer: Self-pay | Admitting: Internal Medicine

## 2017-10-26 ENCOUNTER — Other Ambulatory Visit: Payer: Self-pay | Admitting: Internal Medicine

## 2017-10-26 VITALS — BP 139/98 | HR 111 | Temp 98.4°F | Resp 19 | Ht 73.0 in | Wt 240.5 lb

## 2017-10-26 DIAGNOSIS — Z5112 Encounter for antineoplastic immunotherapy: Secondary | ICD-10-CM | POA: Diagnosis not present

## 2017-10-26 DIAGNOSIS — C3492 Malignant neoplasm of unspecified part of left bronchus or lung: Secondary | ICD-10-CM

## 2017-10-26 DIAGNOSIS — C7949 Secondary malignant neoplasm of other parts of nervous system: Principal | ICD-10-CM

## 2017-10-26 DIAGNOSIS — Z79899 Other long term (current) drug therapy: Secondary | ICD-10-CM | POA: Insufficient documentation

## 2017-10-26 DIAGNOSIS — C7931 Secondary malignant neoplasm of brain: Secondary | ICD-10-CM

## 2017-10-26 DIAGNOSIS — C7B8 Other secondary neuroendocrine tumors: Secondary | ICD-10-CM

## 2017-10-26 DIAGNOSIS — Z7901 Long term (current) use of anticoagulants: Secondary | ICD-10-CM | POA: Diagnosis not present

## 2017-10-26 DIAGNOSIS — Z923 Personal history of irradiation: Secondary | ICD-10-CM | POA: Insufficient documentation

## 2017-10-26 DIAGNOSIS — I10 Essential (primary) hypertension: Secondary | ICD-10-CM | POA: Insufficient documentation

## 2017-10-26 DIAGNOSIS — C7A1 Malignant poorly differentiated neuroendocrine tumors: Secondary | ICD-10-CM | POA: Diagnosis not present

## 2017-10-26 DIAGNOSIS — Z5111 Encounter for antineoplastic chemotherapy: Secondary | ICD-10-CM

## 2017-10-26 DIAGNOSIS — Z9221 Personal history of antineoplastic chemotherapy: Secondary | ICD-10-CM

## 2017-10-26 DIAGNOSIS — C3490 Malignant neoplasm of unspecified part of unspecified bronchus or lung: Secondary | ICD-10-CM

## 2017-10-26 LAB — CBC WITH DIFFERENTIAL/PLATELET
Basophils Absolute: 0 10*3/uL (ref 0.0–0.1)
Basophils Relative: 1 %
Eosinophils Absolute: 0.1 10*3/uL (ref 0.0–0.5)
Eosinophils Relative: 4 %
HCT: 43.2 % (ref 38.4–49.9)
Hemoglobin: 14.2 g/dL (ref 13.0–17.1)
Lymphocytes Relative: 19 %
Lymphs Abs: 0.7 10*3/uL — ABNORMAL LOW (ref 0.9–3.3)
MCH: 36.6 pg — ABNORMAL HIGH (ref 27.2–33.4)
MCHC: 33 g/dL (ref 32.0–36.0)
MCV: 111 fL — ABNORMAL HIGH (ref 79.3–98.0)
Monocytes Absolute: 0.6 10*3/uL (ref 0.1–0.9)
Monocytes Relative: 16 %
Neutro Abs: 2.2 10*3/uL (ref 1.5–6.5)
Neutrophils Relative %: 60 %
Platelets: 236 10*3/uL (ref 140–400)
RBC: 3.89 MIL/uL — ABNORMAL LOW (ref 4.20–5.82)
RDW: 15.1 % (ref 11.0–15.6)
WBC: 3.7 10*3/uL — ABNORMAL LOW (ref 4.0–10.3)

## 2017-10-26 LAB — COMPREHENSIVE METABOLIC PANEL
ALT: 43 U/L (ref 0–55)
AST: 29 U/L (ref 5–34)
Albumin: 3.5 g/dL (ref 3.5–5.0)
Alkaline Phosphatase: 81 U/L (ref 40–150)
Anion gap: 11 (ref 3–11)
BUN: 8 mg/dL (ref 7–26)
CO2: 24 mmol/L (ref 22–29)
Calcium: 9.2 mg/dL (ref 8.4–10.4)
Chloride: 105 mmol/L (ref 98–109)
Creatinine, Ser: 1.06 mg/dL (ref 0.70–1.30)
GFR calc Af Amer: >60 mL/min (ref >60)
GFR calc non Af Amer: >60 mL/min (ref >60)
Glucose, Bld: 118 mg/dL (ref 70–140)
Potassium: 4 mmol/L (ref 3.5–5.1)
Sodium: 140 mmol/L (ref 136–145)
Total Bilirubin: 0.3 mg/dL (ref 0.2–1.2)
Total Protein: 7.3 g/dL (ref 6.4–8.3)

## 2017-10-26 LAB — UA PROTEIN, DIPSTICK - CHCC: Protein, ur: NEGATIVE mg/dL

## 2017-10-26 LAB — RESEARCH LABS

## 2017-10-26 MED ORDER — TRAMADOL HCL 50 MG PO TABS: 50.0000 mg | ORAL_TABLET | Freq: Two times a day (BID) | ORAL | 0 refills | Status: DC | PRN

## 2017-10-26 MED ORDER — CYANOCOBALAMIN 1000 MCG/ML IJ SOLN
INTRAMUSCULAR | Status: AC
  Filled 2017-10-26: qty 1

## 2017-10-26 MED ORDER — DEXAMETHASONE 4 MG PO TABS: ORAL_TABLET | ORAL | 1 refills | Status: DC

## 2017-10-26 MED ORDER — PEMETREXED DISODIUM CHEMO INJECTION 500 MG
510.0000 mg/m2 | Freq: Once | INTRAVENOUS | Status: AC
  Administered 2017-10-26: 1200 mg via INTRAVENOUS
  Filled 2017-10-26: qty 40

## 2017-10-26 MED ORDER — SODIUM CHLORIDE 0.9 % IV SOLN
15.2000 mg/kg | Freq: Once | INTRAVENOUS | Status: AC
  Administered 2017-10-26: 1600 mg via INTRAVENOUS
  Filled 2017-10-26: qty 64

## 2017-10-26 MED ORDER — PROCHLORPERAZINE MALEATE 10 MG PO TABS
10.0000 mg | ORAL_TABLET | Freq: Once | ORAL | Status: AC
Start: 2017-10-26 — End: 2017-10-26
  Administered 2017-10-26: 10 mg via ORAL

## 2017-10-26 MED ORDER — LORAZEPAM 0.5 MG PO TABS: ORAL_TABLET | ORAL | 0 refills | Status: DC

## 2017-10-26 MED ORDER — PROCHLORPERAZINE MALEATE 10 MG PO TABS
ORAL_TABLET | ORAL | Status: AC
  Filled 2017-10-26: qty 1

## 2017-10-26 MED ORDER — SODIUM CHLORIDE 0.9 % IV SOLN
Freq: Once | INTRAVENOUS | Status: AC
  Administered 2017-10-26: 10:00:00 via INTRAVENOUS

## 2017-10-26 MED ORDER — CYANOCOBALAMIN 1000 MCG/ML IJ SOLN
1000.0000 ug | Freq: Once | INTRAMUSCULAR | Status: AC
  Administered 2017-10-26: 1000 ug via INTRAMUSCULAR

## 2017-10-26 NOTE — Progress Notes (Signed)
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  PRIOR THERAPY:  1) Stereotactic radiotherapy to a solitary brain metastasis under the care of Dr. Lisbeth Renshaw on 01/25/2017. 2) Short course of concurrent chemoradiation with weekly carboplatin for AUC of 2 and paclitaxel 45 MG/M2 to the locally advanced disease in the chest. Status post 3 cycles. 3) Systemic chemotherapy with carboplatin for AUC of 5, Alimta 500 MG/M2 and Avastin 15 MG/KG every 3 weeks. First dose of 02/15/2017. Status post 6 cycles. Last dose 06/08/2017 with stable disease.   CURRENT THERAPY: Maintenance systemic chemotherapy with Alimta 500 MG/M2 in addition to Avastin 15 MG/KG every 3 weeks. First dose 07/13/2017.  Status post 5 cycles.  INTERVAL HISTORY: RONTRELL MOQUIN 52 y.o. male returns to the clinic today for follow-up visit.  The patient is feeling fine with no specific complaints except for mild swelling in the ankles.  He denied having any chest pain, shortness breath, cough or hemoptysis.  He denied having any fever or chills.  He has no nausea, vomiting, diarrhea or constipation.  He continues to tolerate his maintenance treatment with Alimta and Avastin fairly well.  He is here today for evaluation before starting cycle #6 of his treatment.  MEDICAL HISTORY: Past Medical History:  Diagnosis Date  . Encounter for antineoplastic chemotherapy 12/31/2016  . GERD 11/08/2009   Qualifier: Diagnosis of  By: Ronnald Ramp MD, Arvid Right Goals of care, counseling/discussion 12/31/2016  . History of  radiation therapy 01/13/2017 - 02/02/2017   Site/dose:  Left lung: 30 Gy in 15 fractions  . HYPERTENSION 11/08/2009   Qualifier: Diagnosis of  By: Ronnald Ramp MD, Arvid Right.   . 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 Migraine 02/25/2012  . Pneumonia   . PONV (postoperative nausea and vomiting)   . Rotator cuff tear, left     ALLERGIES:  is allergic to no known allergies.  MEDICATIONS:  Current Outpatient Medications  Medication Sig Dispense Refill  . amLODipine (NORVASC) 5 MG tablet Take 5 mg by mouth daily.    . budesonide-formoterol (SYMBICORT) 80-4.5 MCG/ACT inhaler Inhale 2 puffs into the lungs 2 (two) times daily. 1 Inhaler 5  . chlorpheniramine-HYDROcodone (TUSSIONEX) 10-8 MG/5ML SUER Take 5 mLs by mouth every 12 (twelve) hours as needed for cough. 473 mL 0  . dexamethasone (DECADRON) 4 MG tablet 4 mg by mouth twice a day the day before, day of and day after the chemotherapy 40 tablet 1  . enoxaparin (LOVENOX) 150 MG/ML injection Inject 1 mL (150 mg total) into the skin daily. 30 Syringe 2  . folic acid (FOLVITE) 1 MG tablet Take 1 tablet (1 mg total) by mouth daily. 30 tablet 4  . LORazepam (ATIVAN) 0.5 MG tablet 1 tablet po q 4-6 hours prn anxiety and one po 30 minutes prior to radiation or MRI 30 tablet 0  . losartan (COZAAR) 50 MG tablet Take 50 mg by mouth daily.    . Multiple Vitamin (MULTIVITAMIN WITH MINERALS) TABS tablet Take 1 tablet by mouth daily.    . pantoprazole (PROTONIX) 40  MG tablet Take 40 mg by mouth daily.  2  . prochlorperazine (COMPAZINE) 10 MG tablet TAKE 1 TABLET BY MOUTH  EVERY 6 HOURS AS NEEDED FOR NAUSEA OR VOMITING 30 tablet 0  . traMADol (ULTRAM) 50 MG tablet Take 1 tablet (50 mg total) every 12 (twelve) hours as needed by mouth. 30 tablet 0   No current facility-administered medications for this visit.     SURGICAL HISTORY:  Past Surgical History:  Procedure Laterality Date  . INGUINAL HERNIA REPAIR    . SHOULDER ARTHROSCOPY WITH  ROTATOR CUFF REPAIR Left 11/12/2016   Procedure: SHOULDER ARTHROSCOPY WITH ROTATOR CUFF REPAIR AND SUBACROMIAL DECOMPRESSION;  Surgeon: Tania Ade, MD;  Location: [facility];  Service: Orthopedics;  Laterality: Left;  SHOULDER ARTHROSCOPY WITH ROTATOR CUFF REPAIR AND SUBACROMIAL DECOMPRESSION  . TONSILLECTOMY    . TOTAL KNEE ARTHROPLASTY    . VIDEO BRONCHOSCOPY Bilateral 12/10/2016   Procedure: VIDEO BRONCHOSCOPY WITHOUT FLUORO;  Surgeon: Tanda Rockers, MD;  Location: [facility];  Service: Cardiopulmonary;  Laterality: Bilateral;    REVIEW OF SYSTEMS:  A comprehensive review of systems was negative.   PHYSICAL EXAMINATION: General appearance: alert, cooperative and no distress Head: Normocephalic, without obvious abnormality, atraumatic Neck: no adenopathy, no JVD, supple, symmetrical, trachea midline and thyroid not enlarged, symmetric, no tenderness/mass/nodules Lymph nodes: Cervical, supraclavicular, and axillary nodes normal. Resp: clear to auscultation bilaterally Back: symmetric, no curvature. ROM normal. No CVA tenderness. Cardio: regular rate and rhythm, S1, S2 normal, no murmur, click, rub or gallop GI: soft, non-tender; bowel sounds normal; no masses,  no organomegaly Extremities: extremities normal, atraumatic, no cyanosis or edema  ECOG PERFORMANCE STATUS: 1 - Symptomatic but completely ambulatory  Blood pressure (!) 139/98, pulse (!) 111, temperature 98.4 F (36.9 C), temperature source Oral, resp. rate 19, height 6\' 1"  (1.854 m), weight 240 lb 8 oz (109.1 kg), SpO2 100 %.  LABORATORY DATA: Lab Results  Component Value Date   WBC 3.7 (L) 10/26/2017   HGB 14.2 10/26/2017   HCT 43.2 10/26/2017   MCV 111.0 (H) 10/26/2017   PLT 236 10/26/2017      Chemistry      Component Value Date/Time   NA 138 10/08/2017 0907   K 4.1 10/08/2017 0907   CL 103 11/26/2016 0438   CO2 24 10/08/2017 0907   BUN 11.7 10/08/2017 0907   CREATININE 1.2 10/08/2017 0907      Component Value  Date/Time   CALCIUM 9.5 10/08/2017 0907   ALKPHOS 88 10/08/2017 0907   AST 22 10/08/2017 0907   ALT 24 10/08/2017 0907   BILITOT 0.42 10/08/2017 0907       RADIOGRAPHIC STUDIES: No results found.  ASSESSMENT AND PLAN: This is a very pleasant 52 years old white male with stage IV non-small cell lung cancer, high-grade neuroendocrine carcinoma presented with locally advanced disease in the chest as well as solitary brain metastasis and 2 liver lesions. The patient is status post stereotactic radiotherapy to the solitary brain metastasis as well as short course of concurrent chemoradiation to the locally advanced disease in the chest. He recently completed 6 cycles of systemic chemotherapy with carboplatin, Alimta and Avastin and tolerated this treatment fairly well. The patient is currently on maintenance treatment with Alimta and Avastin status post 5 cycles.  The patient continues to tolerate this treatment well with no concerning complaints.  I recommended for him to proceed with cycle #6 today as a scheduled.  I will see him back for follow-up visit  in 3 weeks for evaluation after repeating CT scan of the chest, abdomen and pelvis for restaging of his disease. I gave the patient refill of tramadol, Ativan and Decadron today. He was advised to call immediately if he has any concerning symptoms in the interval. The patient voices understanding of current disease status and treatment options and is in agreement with the current care plan. All questions were answered. The patient knows to call the clinic with any problems, questions or concerns. We can certainly see the patient much sooner if necessary.  Disclaimer: This note was dictated with voice recognition software. Similar sounding words can inadvertently be transcribed and may not be corrected upon review.

## 2017-10-26 NOTE — Patient Instructions (Signed)
[facility] Discharge Instructions for Patients Receiving Chemotherapy  Today you received the following chemotherapy agents Avastin and Alimta  To help prevent nausea and vomiting after your treatment, we encourage you to take your nausea medication as directed   If you develop nausea and vomiting that is not controlled by your nausea medication, call the clinic.   BELOW ARE SYMPTOMS THAT SHOULD BE REPORTED IMMEDIATELY:  *FEVER GREATER THAN 100.5 F  *CHILLS WITH OR WITHOUT FEVER  NAUSEA AND VOMITING THAT IS NOT CONTROLLED WITH YOUR NAUSEA MEDICATION  *UNUSUAL SHORTNESS OF BREATH  *UNUSUAL BRUISING OR BLEEDING  TENDERNESS IN MOUTH AND THROAT WITH OR WITHOUT PRESENCE OF ULCERS  *URINARY PROBLEMS  *BOWEL PROBLEMS  UNUSUAL RASH Items with * indicate a potential emergency and should be followed up as soon as possible.  Feel free to call the clinic should you have any questions or concerns. The clinic phone number is [phone].  Please show the [facility] at check-in to the Emergency Department and triage nurse.

## 2017-10-26 NOTE — Telephone Encounter (Signed)
Scheduled appt pe r1/15 los - Patient to get an updated schedule in the treatment area.

## 2017-11-04 ENCOUNTER — Telehealth: Payer: Self-pay | Admitting: *Deleted

## 2017-11-04 ENCOUNTER — Other Ambulatory Visit: Payer: Self-pay | Admitting: *Deleted

## 2017-11-04 MED ORDER — FUROSEMIDE 20 MG PO TABS: 20.0000 mg | ORAL_TABLET | Freq: Every day | ORAL | 0 refills | Status: DC | PRN

## 2017-11-04 NOTE — Telephone Encounter (Signed)
Wife called on behalf of pt, Feet and ankles swelling, tender to touch, red and pain with walking. Pt keeps feet elevated during day, swelling is better in the morning. Reviewed with MD, VO lasix 20mg  PO daily prn for swelling. Rx sent to [facility] [address].  Discussed MD instructions with Baker Janus ( wife) who verbalized understanding.

## 2017-11-12 ENCOUNTER — Ambulatory Visit (HOSPITAL_COMMUNITY)
Admission: RE | Admit: 2017-11-12 | Discharge: 2017-11-12 | Disposition: A | Discharge status: Home / Self Care | Payer: 59 | Source: Ambulatory Visit | Attending: Internal Medicine | Admitting: Internal Medicine

## 2017-11-12 DIAGNOSIS — K760 Fatty (change of) liver, not elsewhere classified: Secondary | ICD-10-CM | POA: Insufficient documentation

## 2017-11-12 DIAGNOSIS — I700 Atherosclerosis of aorta: Secondary | ICD-10-CM | POA: Diagnosis not present

## 2017-11-12 DIAGNOSIS — R911 Solitary pulmonary nodule: Secondary | ICD-10-CM | POA: Diagnosis not present

## 2017-11-12 DIAGNOSIS — C3490 Malignant neoplasm of unspecified part of unspecified bronchus or lung: Secondary | ICD-10-CM

## 2017-11-12 MED ORDER — IOPAMIDOL (ISOVUE-300) INJECTION 61%
100.0000 mL | Freq: Once | INTRAVENOUS | Status: AC | PRN
  Administered 2017-11-12: 100 mL via INTRAVENOUS

## 2017-11-12 MED ORDER — IOPAMIDOL (ISOVUE-300) INJECTION 61%
INTRAVENOUS | Status: AC
  Administered 2017-11-12: 100 mL via INTRAVENOUS
  Filled 2017-11-12: qty 100

## 2017-11-15 DIAGNOSIS — C7B8 Other secondary neuroendocrine tumors: Secondary | ICD-10-CM | POA: Insufficient documentation

## 2017-11-15 DIAGNOSIS — Z5112 Encounter for antineoplastic immunotherapy: Secondary | ICD-10-CM | POA: Diagnosis present

## 2017-11-15 DIAGNOSIS — Z79899 Other long term (current) drug therapy: Secondary | ICD-10-CM | POA: Diagnosis not present

## 2017-11-15 DIAGNOSIS — Z7901 Long term (current) use of anticoagulants: Secondary | ICD-10-CM | POA: Diagnosis not present

## 2017-11-15 DIAGNOSIS — I2699 Other pulmonary embolism without acute cor pulmonale: Secondary | ICD-10-CM | POA: Insufficient documentation

## 2017-11-15 DIAGNOSIS — C7A1 Malignant poorly differentiated neuroendocrine tumors: Secondary | ICD-10-CM | POA: Diagnosis not present

## 2017-11-15 DIAGNOSIS — Z5111 Encounter for antineoplastic chemotherapy: Secondary | ICD-10-CM | POA: Insufficient documentation

## 2017-11-16 ENCOUNTER — Other Ambulatory Visit: Payer: Self-pay | Admitting: Medical Oncology

## 2017-11-16 ENCOUNTER — Inpatient Hospital Stay: Payer: 59

## 2017-11-16 ENCOUNTER — Telehealth: Payer: Self-pay | Admitting: Medical Oncology

## 2017-11-16 ENCOUNTER — Encounter: Payer: Self-pay | Admitting: Internal Medicine

## 2017-11-16 ENCOUNTER — Inpatient Hospital Stay: Payer: 59 | Attending: Internal Medicine | Admitting: Internal Medicine

## 2017-11-16 VITALS — BP 153/95 | HR 91

## 2017-11-16 VITALS — BP 170/65 | HR 112 | Temp 98.3°F | Resp 18 | Ht 73.0 in | Wt 237.9 lb

## 2017-11-16 DIAGNOSIS — I10 Essential (primary) hypertension: Secondary | ICD-10-CM | POA: Diagnosis not present

## 2017-11-16 DIAGNOSIS — I2609 Other pulmonary embolism with acute cor pulmonale: Secondary | ICD-10-CM

## 2017-11-16 DIAGNOSIS — I2699 Other pulmonary embolism without acute cor pulmonale: Secondary | ICD-10-CM

## 2017-11-16 DIAGNOSIS — C7A1 Malignant poorly differentiated neuroendocrine tumors: Secondary | ICD-10-CM

## 2017-11-16 DIAGNOSIS — Z7901 Long term (current) use of anticoagulants: Secondary | ICD-10-CM | POA: Diagnosis not present

## 2017-11-16 DIAGNOSIS — C3492 Malignant neoplasm of unspecified part of left bronchus or lung: Secondary | ICD-10-CM

## 2017-11-16 DIAGNOSIS — C7B8 Other secondary neuroendocrine tumors: Secondary | ICD-10-CM | POA: Diagnosis not present

## 2017-11-16 DIAGNOSIS — I2782 Chronic pulmonary embolism: Principal | ICD-10-CM

## 2017-11-16 DIAGNOSIS — C7931 Secondary malignant neoplasm of brain: Secondary | ICD-10-CM

## 2017-11-16 DIAGNOSIS — Z5111 Encounter for antineoplastic chemotherapy: Secondary | ICD-10-CM

## 2017-11-16 DIAGNOSIS — Z923 Personal history of irradiation: Secondary | ICD-10-CM

## 2017-11-16 DIAGNOSIS — Z5112 Encounter for antineoplastic immunotherapy: Secondary | ICD-10-CM | POA: Diagnosis not present

## 2017-11-16 LAB — COMPREHENSIVE METABOLIC PANEL
ALT: 35 U/L (ref 0–55)
AST: 24 U/L (ref 5–34)
Albumin: 3.7 g/dL (ref 3.5–5.0)
Alkaline Phosphatase: 90 U/L (ref 40–150)
Anion gap: 12 — ABNORMAL HIGH (ref 3–11)
BUN: 11 mg/dL (ref 7–26)
CO2: 22 mmol/L (ref 22–29)
Calcium: 9.6 mg/dL (ref 8.4–10.4)
Chloride: 102 mmol/L (ref 98–109)
Creatinine, Ser: 0.98 mg/dL (ref 0.70–1.30)
GFR calc Af Amer: >60 mL/min (ref >60)
GFR calc non Af Amer: >60 mL/min (ref >60)
Glucose, Bld: 178 mg/dL — ABNORMAL HIGH (ref 70–140)
Potassium: 4.5 mmol/L (ref 3.5–5.1)
Sodium: 136 mmol/L (ref 136–145)
Total Bilirubin: 0.5 mg/dL (ref 0.2–1.2)
Total Protein: 7.9 g/dL (ref 6.4–8.3)

## 2017-11-16 LAB — CBC WITH DIFFERENTIAL/PLATELET
Basophils Absolute: 0 10*3/uL (ref 0.0–0.1)
Basophils Relative: 0 %
Eosinophils Absolute: 0 10*3/uL (ref 0.0–0.5)
Eosinophils Relative: 0 %
HCT: 44.5 % (ref 38.4–49.9)
Hemoglobin: 15.2 g/dL (ref 13.0–17.1)
Lymphocytes Relative: 12 %
Lymphs Abs: 0.3 10*3/uL — ABNORMAL LOW (ref 0.9–3.3)
MCH: 37.6 pg — ABNORMAL HIGH (ref 27.2–33.4)
MCHC: 34.1 g/dL (ref 32.0–36.0)
MCV: 110.3 fL — ABNORMAL HIGH (ref 79.3–98.0)
Monocytes Absolute: 0.1 10*3/uL (ref 0.1–0.9)
Monocytes Relative: 2 %
Neutro Abs: 2.3 10*3/uL (ref 1.5–6.5)
Neutrophils Relative %: 86 %
Platelets: 271 10*3/uL (ref 140–400)
RBC: 4.04 MIL/uL — ABNORMAL LOW (ref 4.20–5.82)
RDW: 15.8 % — ABNORMAL HIGH (ref 11.0–14.6)
WBC: 2.6 10*3/uL — ABNORMAL LOW (ref 4.0–10.3)

## 2017-11-16 LAB — UA PROTEIN, DIPSTICK - CHCC: Protein, ur: NEGATIVE mg/dL

## 2017-11-16 MED ORDER — SODIUM CHLORIDE 0.9 % IV SOLN
15.1000 mg/kg | Freq: Once | INTRAVENOUS | Status: AC
  Administered 2017-11-16: 1600 mg via INTRAVENOUS
  Filled 2017-11-16: qty 64

## 2017-11-16 MED ORDER — SODIUM CHLORIDE 0.9 % IV SOLN
Freq: Once | INTRAVENOUS | Status: AC
  Administered 2017-11-16: 10:00:00 via INTRAVENOUS

## 2017-11-16 MED ORDER — PROCHLORPERAZINE MALEATE 10 MG PO TABS
10.0000 mg | ORAL_TABLET | Freq: Once | ORAL | Status: AC
  Administered 2017-11-16: 10 mg via ORAL

## 2017-11-16 MED ORDER — CLONIDINE HCL 0.1 MG PO TABS
0.2000 mg | ORAL_TABLET | Freq: Once | ORAL | Status: AC
  Administered 2017-11-16: 0.2 mg via ORAL

## 2017-11-16 MED ORDER — FUROSEMIDE 20 MG PO TABS: 20.0000 mg | ORAL_TABLET | Freq: Every day | ORAL | 0 refills | Status: DC | PRN

## 2017-11-16 MED ORDER — RIVAROXABAN (XARELTO) VTE STARTER PACK (15 & 20 MG): ORAL_TABLET | ORAL | 0 refills | Status: DC

## 2017-11-16 MED ORDER — PROCHLORPERAZINE MALEATE 10 MG PO TABS
ORAL_TABLET | ORAL | Status: AC
  Filled 2017-11-16: qty 1

## 2017-11-16 MED ORDER — CLONIDINE HCL 0.1 MG PO TABS
ORAL_TABLET | ORAL | Status: AC
  Filled 2017-11-16: qty 2

## 2017-11-16 MED ORDER — SODIUM CHLORIDE 0.9 % IV SOLN
510.0000 mg/m2 | Freq: Once | INTRAVENOUS | Status: AC
  Administered 2017-11-16: 1200 mg via INTRAVENOUS
  Filled 2017-11-16: qty 40

## 2017-11-16 NOTE — Patient Instructions (Signed)
[facility] Discharge Instructions for Patients Receiving Chemotherapy  Today you received the following chemotherapy agents Alimta and Avastin.  To help prevent nausea and vomiting after your treatment, we encourage you to take your nausea medication as directed.   If you develop nausea and vomiting that is not controlled by your nausea medication, call the clinic.   BELOW ARE SYMPTOMS THAT SHOULD BE REPORTED IMMEDIATELY:  *FEVER GREATER THAN 100.5 F  *CHILLS WITH OR WITHOUT FEVER  NAUSEA AND VOMITING THAT IS NOT CONTROLLED WITH YOUR NAUSEA MEDICATION  *UNUSUAL SHORTNESS OF BREATH  *UNUSUAL BRUISING OR BLEEDING  TENDERNESS IN MOUTH AND THROAT WITH OR WITHOUT PRESENCE OF ULCERS  *URINARY PROBLEMS  *BOWEL PROBLEMS  UNUSUAL RASH Items with * indicate a potential emergency and should be followed up as soon as possible.  Feel free to call the clinic should you have any questions or concerns. The clinic phone number is [phone].  Please show the [facility] at check-in to the Emergency Department and triage nurse.

## 2017-11-16 NOTE — Progress Notes (Signed)
[name] [phone]   [phone]  OFFICE PROGRESS NOTE  [name], MD 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  PRIOR THERAPY:  1) Stereotactic radiotherapy to a solitary brain metastasis under the care of Dr. Lisbeth Renshaw on 01/25/2017. 2) Short course of concurrent chemoradiation with weekly carboplatin for AUC of 2 and paclitaxel 45 MG/M2 to the locally advanced disease in the chest. Status post 3 cycles. 3) Systemic chemotherapy with carboplatin for AUC of 5, Alimta 500 MG/M2 and Avastin 15 MG/KG every 3 weeks. First dose of 02/15/2017. Status post 6 cycles. Last dose 06/08/2017 with stable disease.   CURRENT THERAPY: Maintenance systemic chemotherapy with Alimta 500 MG/M2 in addition to Avastin 15 MG/KG every 3 weeks. First dose 07/13/2017.  Status post 6 cycles.  INTERVAL HISTORY: Calvin Wagner 52 y.o. male returns to the clinic today for follow-up visit accompanied by his wife.  The patient is feeling fine today with no specific complaints except for mild swelling of the lower extremities.  He was seen recently by his primary care physician for adjustment of his blood pressure medication but he continues to have hypertension.  He denied having any current chest pain, shortness breath, cough or hemoptysis.  He denied having any fever or chills.  He has no nausea, vomiting, diarrhea or constipation.  He continues to tolerate his treatment with Alimta and Avastin fairly well.  He had repeat CT scan of the chest, abdomen and pelvis performed recently and he is here for evaluation and discussion of his discuss results.  MEDICAL  HISTORY: Past Medical History:  Diagnosis Date  . Encounter for antineoplastic chemotherapy 12/31/2016  . GERD 11/08/2009   Qualifier: Diagnosis of  By: Ronnald Ramp MD, Arvid Right Goals of care, counseling/discussion 12/31/2016  . History of radiation therapy 01/13/2017 - 02/02/2017   Site/dose:  Left lung: 30 Gy in 15 fractions  . HYPERTENSION 11/08/2009   Qualifier: Diagnosis of  By: Ronnald Ramp MD, Arvid Right.   . 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 Migraine 02/25/2012  . Pneumonia   . PONV (postoperative nausea and vomiting)   . Rotator cuff tear, left     ALLERGIES:  is allergic to no known allergies.  MEDICATIONS:  Current Outpatient Medications  Medication Sig Dispense Refill  . amLODipine (NORVASC) 5 MG tablet Take 5 mg by mouth daily.    . budesonide-formoterol (SYMBICORT) 80-4.5 MCG/ACT inhaler Inhale 2 puffs into the lungs 2 (two) times daily. 1 Inhaler 5  . chlorpheniramine-HYDROcodone (TUSSIONEX) 10-8 MG/5ML SUER Take 5 mLs by mouth every 12 (twelve) hours as needed for cough. 473 mL 0  . dexamethasone (DECADRON) 4 MG tablet 4 mg by mouth twice a day the day before, day of and day after the chemotherapy 40 tablet 1  . enoxaparin (LOVENOX) 150 MG/ML injection Inject 1 mL (150 mg total) into the skin daily. 30 Syringe 2  . folic acid (FOLVITE) 1 MG tablet Take 1 tablet (1 mg total) by mouth daily. 30 tablet 4  . furosemide (LASIX) 20 MG tablet Take 1 tablet (20 mg total) by mouth daily as needed. 10 tablet 0  . LORazepam (  ATIVAN) 0.5 MG tablet 1 tablet po q 4-6 hours prn anxiety and one po 30 minutes prior to radiation or MRI 30 tablet 0  . losartan (COZAAR) 50 MG tablet Take 50 mg by mouth daily.    . Multiple Vitamin (MULTIVITAMIN WITH MINERALS) TABS tablet Take 1 tablet by mouth daily.    . pantoprazole (PROTONIX) 40 MG tablet Take 40 mg by mouth daily.  2  . prochlorperazine (COMPAZINE) 10 MG tablet TAKE 1 TABLET BY MOUTH  EVERY 6 HOURS AS NEEDED FOR NAUSEA  OR VOMITING 30 tablet 0  . traMADol (ULTRAM) 50 MG tablet Take 1 tablet (50 mg total) by mouth every 12 (twelve) hours as needed. 30 tablet 0   No current facility-administered medications for this visit.     SURGICAL HISTORY:  Past Surgical History:  Procedure Laterality Date  . INGUINAL HERNIA REPAIR    . SHOULDER ARTHROSCOPY WITH ROTATOR CUFF REPAIR Left 11/12/2016   Procedure: SHOULDER ARTHROSCOPY WITH ROTATOR CUFF REPAIR AND SUBACROMIAL DECOMPRESSION;  Surgeon: Tania Ade, MD;  Location: [facility];  Service: Orthopedics;  Laterality: Left;  SHOULDER ARTHROSCOPY WITH ROTATOR CUFF REPAIR AND SUBACROMIAL DECOMPRESSION  . TONSILLECTOMY    . TOTAL KNEE ARTHROPLASTY    . VIDEO BRONCHOSCOPY Bilateral 12/10/2016   Procedure: VIDEO BRONCHOSCOPY WITHOUT FLUORO;  Surgeon: Tanda Rockers, MD;  Location: [facility];  Service: Cardiopulmonary;  Laterality: Bilateral;    REVIEW OF SYSTEMS:  Constitutional: negative Eyes: negative Ears, nose, mouth, throat, and face: negative Respiratory: negative Cardiovascular: negative Gastrointestinal: negative Genitourinary:negative Integument/breast: negative Hematologic/lymphatic: negative Musculoskeletal:negative Neurological: negative Behavioral/Psych: negative Endocrine: negative Allergic/Immunologic: negative   PHYSICAL EXAMINATION: General appearance: alert, cooperative and no distress Head: Normocephalic, without obvious abnormality, atraumatic Neck: no adenopathy, no JVD, supple, symmetrical, trachea midline and thyroid not enlarged, symmetric, no tenderness/mass/nodules Lymph nodes: Cervical, supraclavicular, and axillary nodes normal. Resp: clear to auscultation bilaterally Back: symmetric, no curvature. ROM normal. No CVA tenderness. Cardio: regular rate and rhythm, S1, S2 normal, no murmur, click, rub or gallop GI: soft, non-tender; bowel sounds normal; no masses,  no organomegaly Extremities: edema 1+ Neurologic: Alert and oriented X  3, normal strength and tone. Normal symmetric reflexes. Normal coordination and gait  ECOG PERFORMANCE STATUS: 1 - Symptomatic but completely ambulatory  Blood pressure (!) 170/65, pulse (!) 112, temperature 98.3 F (36.8 C), temperature source Oral, resp. rate 18, height 6\' 1"  (1.854 m), weight 237 lb 14.4 oz (107.9 kg), SpO2 100 %.  LABORATORY DATA: Lab Results  Component Value Date   WBC 3.7 (L) 10/26/2017   HGB 14.2 10/26/2017   HCT 43.2 10/26/2017   MCV 111.0 (H) 10/26/2017   PLT 236 10/26/2017      Chemistry      Component Value Date/Time   NA 140 10/26/2017 0906   NA 138 10/08/2017 0907   K 4.0 10/26/2017 0906   K 4.1 10/08/2017 0907   CL 105 10/26/2017 0906   CO2 24 10/26/2017 0906   CO2 24 10/08/2017 0907   BUN 8 10/26/2017 0906   BUN 11.7 10/08/2017 0907   CREATININE 1.06 10/26/2017 0906   CREATININE 1.2 10/08/2017 0907      Component Value Date/Time   CALCIUM 9.2 10/26/2017 0906   CALCIUM 9.5 10/08/2017 0907   ALKPHOS 81 10/26/2017 0906   ALKPHOS 88 10/08/2017 0907   AST 29 10/26/2017 0906   AST 22 10/08/2017 0907   ALT 43 10/26/2017 0906   ALT 24 10/08/2017 0907   BILITOT 0.3 10/26/2017  4166   BILITOT 0.42 10/08/2017 0907       RADIOGRAPHIC STUDIES: Ct Chest W Contrast  Result Date: 11/12/2017 CLINICAL DATA:  Lung cancer diagnosed 3/18. Chemotherapy in progress. Radiation therapy complete. Asymptomatic. Non-small-cell primary. EXAM: CT CHEST, ABDOMEN, AND PELVIS WITH CONTRAST TECHNIQUE: Multidetector CT imaging of the chest, abdomen and pelvis was performed following the standard protocol during bolus administration of intravenous contrast. CONTRAST:  100 cc of Isovue-300 COMPARISON:  09/10/2017 FINDINGS: CT CHEST FINDINGS Cardiovascular: Tortuous thoracic aorta. Normal heart size with minimal anterior pericardial fluid or thickening, similar. No central pulmonary embolism, on this non-dedicated study. Mediastinum/Nodes: No supraclavicular adenopathy. No  mediastinal or hilar adenopathy. Lungs/Pleura: Trace left-sided pleural thickening is similar. Mild centrilobular emphysema. Left lower lobe pulmonary nodule measures 9 x 8 mm image 134/series 4. Felt to be slightly enlarged from 7 x 7 mm on the prior. A 2 mm superior segment left lower lobe subpleural nodule is unchanged on image 98/series 4. Further increase in left perihilar ground-glass opacity and septal thickening with traction bronchiectasis. No well-defined residual or recurrent disease in this area. Musculoskeletal: No acute osseous abnormality. CT ABDOMEN PELVIS FINDINGS Hepatobiliary: Mild hepatic steatosis, without focal liver lesion. Normal gallbladder, without biliary ductal dilatation. Pancreas: Normal, without mass or ductal dilatation. Spleen: Normal in size, without focal abnormality. Adrenals/Urinary Tract: Normal adrenal glands. Too small to characterize lower pole right renal lesion. Normal left kidney, without hydronephrosis. Normal urinary bladder. Stomach/Bowel: Normal stomach, without wall thickening. Normal colon, appendix, and terminal ileum. Normal small bowel. Vascular/Lymphatic: Aortic atherosclerosis. Upper normal right external iliac node at 10 mm image 126/series 2, similar. Stable borderline left external iliac adenopathy as well. Reproductive: Normal prostate. Other: Surgical changes within the anterior pelvis, likely of bilateral inguinal hernia repair. No significant free fluid. No evidence of omental or peritoneal disease. Presumed iatrogenic edema and gas within the anterior abdominal wall. Musculoskeletal: Degenerative disc disease at the lumbosacral junction with prominent disc bulge at L4-5 IMPRESSION: 1. Mild enlargement of a lateral left lower lobe pulmonary nodule. 2. No thoracic adenopathy. Slight progression of left perihilar and infrahilar radiation fibrosis. 3. No acute process or evidence of metastatic disease in the abdomen or pelvis. 4. Mild hepatic steatosis. 5.   Aortic Atherosclerosis (ICD10-I70.0). 6. Similar tiny superior segment left lower lobe pulmonary nodule. 7. Borderline external iliac adenopathy bilaterally is favored to be reactive and similar. Recommend attention on follow-up. Electronically Signed   By: Abigail Miyamoto M.D.   On: 11/12/2017 13:39   Ct Abdomen Pelvis W Contrast  Result Date: 11/12/2017 CLINICAL DATA:  Lung cancer diagnosed 3/18. Chemotherapy in progress. Radiation therapy complete. Asymptomatic. Non-small-cell primary. EXAM: CT CHEST, ABDOMEN, AND PELVIS WITH CONTRAST TECHNIQUE: Multidetector CT imaging of the chest, abdomen and pelvis was performed following the standard protocol during bolus administration of intravenous contrast. CONTRAST:  100 cc of Isovue-300 COMPARISON:  09/10/2017 FINDINGS: CT CHEST FINDINGS Cardiovascular: Tortuous thoracic aorta. Normal heart size with minimal anterior pericardial fluid or thickening, similar. No central pulmonary embolism, on this non-dedicated study. Mediastinum/Nodes: No supraclavicular adenopathy. No mediastinal or hilar adenopathy. Lungs/Pleura: Trace left-sided pleural thickening is similar. Mild centrilobular emphysema. Left lower lobe pulmonary nodule measures 9 x 8 mm image 134/series 4. Felt to be slightly enlarged from 7 x 7 mm on the prior. A 2 mm superior segment left lower lobe subpleural nodule is unchanged on image 98/series 4. Further increase in left perihilar ground-glass opacity and septal thickening with traction bronchiectasis. No well-defined residual or recurrent  disease in this area. Musculoskeletal: No acute osseous abnormality. CT ABDOMEN PELVIS FINDINGS Hepatobiliary: Mild hepatic steatosis, without focal liver lesion. Normal gallbladder, without biliary ductal dilatation. Pancreas: Normal, without mass or ductal dilatation. Spleen: Normal in size, without focal abnormality. Adrenals/Urinary Tract: Normal adrenal glands. Too small to characterize lower pole right renal lesion.  Normal left kidney, without hydronephrosis. Normal urinary bladder. Stomach/Bowel: Normal stomach, without wall thickening. Normal colon, appendix, and terminal ileum. Normal small bowel. Vascular/Lymphatic: Aortic atherosclerosis. Upper normal right external iliac node at 10 mm image 126/series 2, similar. Stable borderline left external iliac adenopathy as well. Reproductive: Normal prostate. Other: Surgical changes within the anterior pelvis, likely of bilateral inguinal hernia repair. No significant free fluid. No evidence of omental or peritoneal disease. Presumed iatrogenic edema and gas within the anterior abdominal wall. Musculoskeletal: Degenerative disc disease at the lumbosacral junction with prominent disc bulge at L4-5 IMPRESSION: 1. Mild enlargement of a lateral left lower lobe pulmonary nodule. 2. No thoracic adenopathy. Slight progression of left perihilar and infrahilar radiation fibrosis. 3. No acute process or evidence of metastatic disease in the abdomen or pelvis. 4. Mild hepatic steatosis. 5.  Aortic Atherosclerosis (ICD10-I70.0). 6. Similar tiny superior segment left lower lobe pulmonary nodule. 7. Borderline external iliac adenopathy bilaterally is favored to be reactive and similar. Recommend attention on follow-up. Electronically Signed   By: Abigail Miyamoto M.D.   On: 11/12/2017 13:39    ASSESSMENT AND PLAN: This is a very pleasant 53 years old white male with stage IV non-small cell lung cancer, high-grade neuroendocrine carcinoma presented with locally advanced disease in the chest as well as solitary brain metastasis and 2 liver lesions. The patient is status post stereotactic radiotherapy to the solitary brain metastasis as well as short course of concurrent chemoradiation to the locally advanced disease in the chest. He recently completed 6 cycles of systemic chemotherapy with carboplatin, Alimta and Avastin and tolerated this treatment fairly well. The patient is currently on  maintenance treatment with Alimta and Avastin status post 6 cycles and has been tolerating this treatment fairly well with no concerning complaints except for hypertension. He had repeat CT scan of the chest, abdomen and pelvis performed recently.  His a scan showed no concerning findings for disease progression except for a slight increase in left lower lobe pulmonary nodule. I personally and independently reviewed the scans and discussed the results with the patient and his wife. I recommended for him to continue his current treatment with maintenance Alimta and Avastin and he would proceed with cycle #7 today. For hypertension, the patient will receive a dose of clonidine 0.2 mg p.o. x1 in the clinic today.  He was also advised to take his blood pressure medication as prescribed and to discuss with his primary care physician for adjustment if needed. For the history of pulmonary embolism, he is tired of the Lovenox injection and he would like to switch to an oral medications.  I will switch him to Xarelto starting at 15 mg p.o. twice daily for 3 weeks followed by 20 mg p.o. Daily. The patient will come back for follow-up visit in 3 weeks for reevaluation with the start of cycle #8. He was advised to call immediately if he has any concerning symptoms in the interval. The patient voices understanding of current disease status and treatment options and is in agreement with the current care plan. All questions were answered. The patient knows to call the clinic with any problems, questions or concerns. We  can certainly see the patient much sooner if necessary.  Disclaimer: This note was dictated with voice recognition software. Similar sounding words can inadvertently be transcribed and may not be corrected upon review.

## 2017-11-16 NOTE — Telephone Encounter (Signed)
Clarified with wife that pt was prescribed xarelto.

## 2017-11-17 ENCOUNTER — Telehealth: Payer: Self-pay | Admitting: Internal Medicine

## 2017-11-17 NOTE — Telephone Encounter (Signed)
Scheduled appt per 2/5 los - patient to get an updated schedule next visit - also my chart active.

## 2017-12-07 ENCOUNTER — Inpatient Hospital Stay: Payer: 59

## 2017-12-07 ENCOUNTER — Inpatient Hospital Stay (HOSPITAL_BASED_OUTPATIENT_CLINIC_OR_DEPARTMENT_OTHER): Payer: 59 | Admitting: Internal Medicine

## 2017-12-07 ENCOUNTER — Encounter: Payer: Self-pay | Admitting: Internal Medicine

## 2017-12-07 ENCOUNTER — Other Ambulatory Visit: Payer: Self-pay | Admitting: Internal Medicine

## 2017-12-07 ENCOUNTER — Telehealth: Payer: Self-pay | Admitting: Internal Medicine

## 2017-12-07 VITALS — BP 142/92 | HR 99

## 2017-12-07 VITALS — BP 142/99 | HR 111 | Temp 98.3°F | Resp 20 | Ht 73.0 in | Wt 241.6 lb

## 2017-12-07 DIAGNOSIS — I2699 Other pulmonary embolism without acute cor pulmonale: Secondary | ICD-10-CM | POA: Diagnosis not present

## 2017-12-07 DIAGNOSIS — Z79899 Other long term (current) drug therapy: Secondary | ICD-10-CM

## 2017-12-07 DIAGNOSIS — C7A1 Malignant poorly differentiated neuroendocrine tumors: Secondary | ICD-10-CM

## 2017-12-07 DIAGNOSIS — Z7901 Long term (current) use of anticoagulants: Secondary | ICD-10-CM | POA: Diagnosis not present

## 2017-12-07 DIAGNOSIS — Z5111 Encounter for antineoplastic chemotherapy: Secondary | ICD-10-CM

## 2017-12-07 DIAGNOSIS — C3492 Malignant neoplasm of unspecified part of left bronchus or lung: Secondary | ICD-10-CM

## 2017-12-07 DIAGNOSIS — Z5112 Encounter for antineoplastic immunotherapy: Secondary | ICD-10-CM | POA: Diagnosis not present

## 2017-12-07 DIAGNOSIS — C7B8 Other secondary neuroendocrine tumors: Secondary | ICD-10-CM | POA: Diagnosis not present

## 2017-12-07 DIAGNOSIS — I10 Essential (primary) hypertension: Secondary | ICD-10-CM

## 2017-12-07 LAB — CBC WITH DIFFERENTIAL/PLATELET
Basophils Absolute: 0 10*3/uL (ref 0.0–0.1)
Basophils Relative: 0 %
Eosinophils Absolute: 0.1 10*3/uL (ref 0.0–0.5)
Eosinophils Relative: 2 %
HCT: 45.1 % (ref 38.4–49.9)
Hemoglobin: 14.8 g/dL (ref 13.0–17.1)
Lymphocytes Relative: 14 %
Lymphs Abs: 0.7 10*3/uL — ABNORMAL LOW (ref 0.9–3.3)
MCH: 37 pg — ABNORMAL HIGH (ref 27.2–33.4)
MCHC: 32.8 g/dL (ref 32.0–36.0)
MCV: 112.8 fL — ABNORMAL HIGH (ref 79.3–98.0)
Monocytes Absolute: 0.8 10*3/uL (ref 0.1–0.9)
Monocytes Relative: 14 %
Neutro Abs: 3.7 10*3/uL (ref 1.5–6.5)
Neutrophils Relative %: 70 %
Platelets: 191 10*3/uL (ref 140–400)
RBC: 4 MIL/uL — ABNORMAL LOW (ref 4.20–5.82)
RDW: 15.6 % — ABNORMAL HIGH (ref 11.0–14.6)
WBC: 5.3 10*3/uL (ref 4.0–10.3)

## 2017-12-07 LAB — COMPREHENSIVE METABOLIC PANEL
ALT: 35 U/L (ref 0–55)
AST: 27 U/L (ref 5–34)
Albumin: 3.7 g/dL (ref 3.5–5.0)
Alkaline Phosphatase: 92 U/L (ref 40–150)
Anion gap: 10 (ref 3–11)
BUN: 11 mg/dL (ref 7–26)
CO2: 26 mmol/L (ref 22–29)
Calcium: 9.7 mg/dL (ref 8.4–10.4)
Chloride: 101 mmol/L (ref 98–109)
Creatinine, Ser: 1.21 mg/dL (ref 0.70–1.30)
GFR calc Af Amer: >60 mL/min (ref >60)
GFR calc non Af Amer: >60 mL/min (ref >60)
Glucose, Bld: 153 mg/dL — ABNORMAL HIGH (ref 70–140)
Potassium: 4.4 mmol/L (ref 3.5–5.1)
Sodium: 137 mmol/L (ref 136–145)
Total Bilirubin: 0.6 mg/dL (ref 0.2–1.2)
Total Protein: 7.5 g/dL (ref 6.4–8.3)

## 2017-12-07 LAB — TOTAL PROTEIN, URINE DIPSTICK: Protein, ur: NEGATIVE mg/dL

## 2017-12-07 MED ORDER — SODIUM CHLORIDE 0.9 % IV SOLN
15.2000 mg/kg | Freq: Once | INTRAVENOUS | Status: AC
  Administered 2017-12-07: 1600 mg via INTRAVENOUS
  Filled 2017-12-07: qty 64

## 2017-12-07 MED ORDER — SODIUM CHLORIDE 0.9 % IV SOLN
Freq: Once | INTRAVENOUS | Status: AC
  Administered 2017-12-07: 15:00:00 via INTRAVENOUS

## 2017-12-07 MED ORDER — PROCHLORPERAZINE MALEATE 10 MG PO TABS
10.0000 mg | ORAL_TABLET | Freq: Once | ORAL | Status: AC
  Administered 2017-12-07: 10 mg via ORAL

## 2017-12-07 MED ORDER — SODIUM CHLORIDE 0.9 % IV SOLN
510.0000 mg/m2 | Freq: Once | INTRAVENOUS | Status: AC
  Administered 2017-12-07: 1200 mg via INTRAVENOUS
  Filled 2017-12-07: qty 40

## 2017-12-07 MED ORDER — PROCHLORPERAZINE MALEATE 10 MG PO TABS
ORAL_TABLET | ORAL | Status: AC
  Filled 2017-12-07: qty 1

## 2017-12-07 NOTE — Telephone Encounter (Signed)
3 cycles already scheduled per 2/26 los.  

## 2017-12-07 NOTE — Patient Instructions (Signed)
[facility] Discharge Instructions for Patients Receiving Chemotherapy  Today you received the following chemotherapy agents alimta   To help prevent nausea and vomiting after your treatment, we encourage you to take your nausea medication as directed  If you develop nausea and vomiting that is not controlled by your nausea medication, call the clinic.   BELOW ARE SYMPTOMS THAT SHOULD BE REPORTED IMMEDIATELY:  *FEVER GREATER THAN 100.5 F  *CHILLS WITH OR WITHOUT FEVER  NAUSEA AND VOMITING THAT IS NOT CONTROLLED WITH YOUR NAUSEA MEDICATION  *UNUSUAL SHORTNESS OF BREATH  *UNUSUAL BRUISING OR BLEEDING  TENDERNESS IN MOUTH AND THROAT WITH OR WITHOUT PRESENCE OF ULCERS  *URINARY PROBLEMS  *BOWEL PROBLEMS  UNUSUAL RASH Items with * indicate a potential emergency and should be followed up as soon as possible.  Feel free to call the clinic you have any questions or concerns. The clinic phone number is [phone].

## 2017-12-07 NOTE — Progress Notes (Signed)
[facility] Telephone:([phone]) [phone]   [phone]  OFFICE PROGRESS NOTE  [name], MD 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  PRIOR THERAPY:  1) Stereotactic radiotherapy to a solitary brain metastasis under the care of Dr. Lisbeth Renshaw on 01/25/2017. 2) Short course of concurrent chemoradiation with weekly carboplatin for AUC of 2 and paclitaxel 45 MG/M2 to the locally advanced disease in the chest. Status post 3 cycles. 3) Systemic chemotherapy with carboplatin for AUC of 5, Alimta 500 MG/M2 and Avastin 15 MG/KG every 3 weeks. First dose of 02/15/2017. Status post 6 cycles. Last dose 06/08/2017 with stable disease.   CURRENT THERAPY: Maintenance systemic chemotherapy with Alimta 500 MG/M2 in addition to Avastin 15 MG/KG every 3 weeks. First dose 07/13/2017.  Status post 7 cycles.  INTERVAL HISTORY: KAGAN MUTCHLER 52 y.o. male returns to the clinic today for follow-up visit accompanied by his wife.  The patient is feeling fine today with no specific complaints except for the swelling in his neck and feet.  He was on Lasix but he is not taking it at regular basis.  He denied having any current chest pain, shortness of breath, cough or hemoptysis.  He has delayed nausea for a few days.  He denied having any fever or chills.  He is here today for evaluation before starting cycle #8.  MEDICAL HISTORY: Past Medical History:  Diagnosis Date  . Encounter for antineoplastic chemotherapy 12/31/2016  . GERD 11/08/2009   Qualifier: Diagnosis of  By: Ronnald Ramp MD, Arvid Right Goals of care, counseling/discussion 12/31/2016  . History of radiation therapy  01/13/2017 - 02/02/2017   Site/dose:  Left lung: 30 Gy in 15 fractions  . HYPERTENSION 11/08/2009   Qualifier: Diagnosis of  By: Ronnald Ramp MD, Arvid Right.   . Large cell carcinoma of left lung, stage 4 ([facility]) 12/31/2016  . Migraine 02/25/2012  . Pneumonia   . PONV (postoperative nausea and vomiting)   . Rotator cuff tear, left     ALLERGIES:  is allergic to no known allergies.  MEDICATIONS:  Current Outpatient Medications  Medication Sig Dispense Refill  . amLODipine (NORVASC) 5 MG tablet Take 5 mg by mouth daily.    . budesonide-formoterol (SYMBICORT) 80-4.5 MCG/ACT inhaler Inhale 2 puffs into the lungs 2 (two) times daily. 1 Inhaler 5  . chlorpheniramine-HYDROcodone (TUSSIONEX) 10-8 MG/5ML SUER Take 5 mLs by mouth every 12 (twelve) hours as needed for cough. 473 mL 0  . dexamethasone (DECADRON) 4 MG tablet 4 mg by mouth twice a day the day before, day of and day after the chemotherapy 40 tablet 1  . folic acid (FOLVITE) 1 MG tablet Take 1 tablet (1 mg total) by mouth daily. 30 tablet 4  . furosemide (LASIX) 20 MG tablet Take 1 tablet (20 mg total) by mouth daily as needed. 20 tablet 0  . LORazepam (ATIVAN) 0.5 MG tablet 1 tablet po q 4-6 hours prn anxiety and one po 30 minutes prior to radiation or MRI 30 tablet 0  . losartan (COZAAR) 50 MG tablet Take 50 mg by mouth daily.    . Multiple Vitamin (MULTIVITAMIN WITH MINERALS) TABS tablet Take 1 tablet by mouth daily.    Marland Kitchen  pantoprazole (PROTONIX) 40 MG tablet Take 40 mg by mouth daily.  2  . prochlorperazine (COMPAZINE) 10 MG tablet TAKE 1 TABLET BY MOUTH  EVERY 6 HOURS AS NEEDED FOR NAUSEA OR VOMITING 30 tablet 0  . Rivaroxaban 15 & 20 MG TBPK Take as directed on package: Start with one 15mg  tablet by mouth twice a day with food. On Day 22, switch to one 20mg  tablet once a day with food. 51 each 0  . traMADol (ULTRAM) 50 MG tablet Take 1 tablet (50 mg total) by mouth every 12 (twelve) hours as needed. 30 tablet 0   No current  facility-administered medications for this visit.     SURGICAL HISTORY:  Past Surgical History:  Procedure Laterality Date  . INGUINAL HERNIA REPAIR    . SHOULDER ARTHROSCOPY WITH ROTATOR CUFF REPAIR Left 11/12/2016   Procedure: SHOULDER ARTHROSCOPY WITH ROTATOR CUFF REPAIR AND SUBACROMIAL DECOMPRESSION;  Surgeon: Tania Ade, MD;  Location: [facility];  Service: Orthopedics;  Laterality: Left;  SHOULDER ARTHROSCOPY WITH ROTATOR CUFF REPAIR AND SUBACROMIAL DECOMPRESSION  . TONSILLECTOMY    . TOTAL KNEE ARTHROPLASTY    . VIDEO BRONCHOSCOPY Bilateral 12/10/2016   Procedure: VIDEO BRONCHOSCOPY WITHOUT FLUORO;  Surgeon: Tanda Rockers, MD;  Location: [facility];  Service: Cardiopulmonary;  Laterality: Bilateral;    REVIEW OF SYSTEMS:  A comprehensive review of systems was negative except for: Swelling of the neck and lower extremities.   PHYSICAL EXAMINATION: General appearance: alert, cooperative and no distress Head: Normocephalic, without obvious abnormality, atraumatic Neck: no adenopathy, no JVD, supple, symmetrical, trachea midline and thyroid not enlarged, symmetric, no tenderness/mass/nodules Lymph nodes: Cervical, supraclavicular, and axillary nodes normal. Resp: clear to auscultation bilaterally Back: symmetric, no curvature. ROM normal. No CVA tenderness. Cardio: regular rate and rhythm, S1, S2 normal, no murmur, click, rub or gallop GI: soft, non-tender; bowel sounds normal; no masses,  no organomegaly Extremities: edema 1+  ECOG PERFORMANCE STATUS: 1 - Symptomatic but completely ambulatory  Blood pressure (!) 142/99, pulse (!) 111, temperature 98.3 F (36.8 C), temperature source Oral, resp. rate 20, height 6\' 1"  (1.854 m), weight 241 lb 9.6 oz (109.6 kg), SpO2 100 %.  LABORATORY DATA: Lab Results  Component Value Date   WBC 5.3 12/07/2017   HGB 14.8 12/07/2017   HCT 45.1 12/07/2017   MCV 112.8 (H) 12/07/2017   PLT 191 12/07/2017      Chemistry      Component Value  Date/Time   NA 136 11/15/2017 0850   NA 138 10/08/2017 0907   K 4.5 11/15/2017 0850   K 4.1 10/08/2017 0907   CL 102 11/15/2017 0850   CO2 22 11/15/2017 0850   CO2 24 10/08/2017 0907   BUN 11 11/15/2017 0850   BUN 11.7 10/08/2017 0907   CREATININE 0.98 11/15/2017 0850   CREATININE 1.2 10/08/2017 0907      Component Value Date/Time   CALCIUM 9.6 11/15/2017 0850   CALCIUM 9.5 10/08/2017 0907   ALKPHOS 90 11/15/2017 0850   ALKPHOS 88 10/08/2017 0907   AST 24 11/15/2017 0850   AST 22 10/08/2017 0907   ALT 35 11/15/2017 0850   ALT 24 10/08/2017 0907   BILITOT 0.5 11/15/2017 0850   BILITOT 0.42 10/08/2017 0907       RADIOGRAPHIC STUDIES: Ct Chest W Contrast  Result Date: 11/12/2017 CLINICAL DATA:  Lung cancer diagnosed 3/18. Chemotherapy in progress. Radiation therapy complete. Asymptomatic. Non-small-cell primary. EXAM: CT CHEST, ABDOMEN, AND PELVIS WITH CONTRAST TECHNIQUE: Multidetector CT imaging  of the chest, abdomen and pelvis was performed following the standard protocol during bolus administration of intravenous contrast. CONTRAST:  100 cc of Isovue-300 COMPARISON:  09/10/2017 FINDINGS: CT CHEST FINDINGS Cardiovascular: Tortuous thoracic aorta. Normal heart size with minimal anterior pericardial fluid or thickening, similar. No central pulmonary embolism, on this non-dedicated study. Mediastinum/Nodes: No supraclavicular adenopathy. No mediastinal or hilar adenopathy. Lungs/Pleura: Trace left-sided pleural thickening is similar. Mild centrilobular emphysema. Left lower lobe pulmonary nodule measures 9 x 8 mm image 134/series 4. Felt to be slightly enlarged from 7 x 7 mm on the prior. A 2 mm superior segment left lower lobe subpleural nodule is unchanged on image 98/series 4. Further increase in left perihilar ground-glass opacity and septal thickening with traction bronchiectasis. No well-defined residual or recurrent disease in this area. Musculoskeletal: No acute osseous abnormality.  CT ABDOMEN PELVIS FINDINGS Hepatobiliary: Mild hepatic steatosis, without focal liver lesion. Normal gallbladder, without biliary ductal dilatation. Pancreas: Normal, without mass or ductal dilatation. Spleen: Normal in size, without focal abnormality. Adrenals/Urinary Tract: Normal adrenal glands. Too small to characterize lower pole right renal lesion. Normal left kidney, without hydronephrosis. Normal urinary bladder. Stomach/Bowel: Normal stomach, without wall thickening. Normal colon, appendix, and terminal ileum. Normal small bowel. Vascular/Lymphatic: Aortic atherosclerosis. Upper normal right external iliac node at 10 mm image 126/series 2, similar. Stable borderline left external iliac adenopathy as well. Reproductive: Normal prostate. Other: Surgical changes within the anterior pelvis, likely of bilateral inguinal hernia repair. No significant free fluid. No evidence of omental or peritoneal disease. Presumed iatrogenic edema and gas within the anterior abdominal wall. Musculoskeletal: Degenerative disc disease at the lumbosacral junction with prominent disc bulge at L4-5 IMPRESSION: 1. Mild enlargement of a lateral left lower lobe pulmonary nodule. 2. No thoracic adenopathy. Slight progression of left perihilar and infrahilar radiation fibrosis. 3. No acute process or evidence of metastatic disease in the abdomen or pelvis. 4. Mild hepatic steatosis. 5.  Aortic Atherosclerosis (ICD10-I70.0). 6. Similar tiny superior segment left lower lobe pulmonary nodule. 7. Borderline external iliac adenopathy bilaterally is favored to be reactive and similar. Recommend attention on follow-up. Electronically Signed   By: Abigail Miyamoto M.D.   On: 11/12/2017 13:39   Ct Abdomen Pelvis W Contrast  Result Date: 11/12/2017 CLINICAL DATA:  Lung cancer diagnosed 3/18. Chemotherapy in progress. Radiation therapy complete. Asymptomatic. Non-small-cell primary. EXAM: CT CHEST, ABDOMEN, AND PELVIS WITH CONTRAST TECHNIQUE:  Multidetector CT imaging of the chest, abdomen and pelvis was performed following the standard protocol during bolus administration of intravenous contrast. CONTRAST:  100 cc of Isovue-300 COMPARISON:  09/10/2017 FINDINGS: CT CHEST FINDINGS Cardiovascular: Tortuous thoracic aorta. Normal heart size with minimal anterior pericardial fluid or thickening, similar. No central pulmonary embolism, on this non-dedicated study. Mediastinum/Nodes: No supraclavicular adenopathy. No mediastinal or hilar adenopathy. Lungs/Pleura: Trace left-sided pleural thickening is similar. Mild centrilobular emphysema. Left lower lobe pulmonary nodule measures 9 x 8 mm image 134/series 4. Felt to be slightly enlarged from 7 x 7 mm on the prior. A 2 mm superior segment left lower lobe subpleural nodule is unchanged on image 98/series 4. Further increase in left perihilar ground-glass opacity and septal thickening with traction bronchiectasis. No well-defined residual or recurrent disease in this area. Musculoskeletal: No acute osseous abnormality. CT ABDOMEN PELVIS FINDINGS Hepatobiliary: Mild hepatic steatosis, without focal liver lesion. Normal gallbladder, without biliary ductal dilatation. Pancreas: Normal, without mass or ductal dilatation. Spleen: Normal in size, without focal abnormality. Adrenals/Urinary Tract: Normal adrenal glands. Too small to characterize lower  pole right renal lesion. Normal left kidney, without hydronephrosis. Normal urinary bladder. Stomach/Bowel: Normal stomach, without wall thickening. Normal colon, appendix, and terminal ileum. Normal small bowel. Vascular/Lymphatic: Aortic atherosclerosis. Upper normal right external iliac node at 10 mm image 126/series 2, similar. Stable borderline left external iliac adenopathy as well. Reproductive: Normal prostate. Other: Surgical changes within the anterior pelvis, likely of bilateral inguinal hernia repair. No significant free fluid. No evidence of omental or  peritoneal disease. Presumed iatrogenic edema and gas within the anterior abdominal wall. Musculoskeletal: Degenerative disc disease at the lumbosacral junction with prominent disc bulge at L4-5 IMPRESSION: 1. Mild enlargement of a lateral left lower lobe pulmonary nodule. 2. No thoracic adenopathy. Slight progression of left perihilar and infrahilar radiation fibrosis. 3. No acute process or evidence of metastatic disease in the abdomen or pelvis. 4. Mild hepatic steatosis. 5.  Aortic Atherosclerosis (ICD10-I70.0). 6. Similar tiny superior segment left lower lobe pulmonary nodule. 7. Borderline external iliac adenopathy bilaterally is favored to be reactive and similar. Recommend attention on follow-up. Electronically Signed   By: Abigail Miyamoto M.D.   On: 11/12/2017 13:39    ASSESSMENT AND PLAN: This is a very pleasant 52 years old white male with stage IV non-small cell lung cancer, high-grade neuroendocrine carcinoma presented with locally advanced disease in the chest as well as solitary brain metastasis and 2 liver lesions. The patient is status post stereotactic radiotherapy to the solitary brain metastasis as well as short course of concurrent chemoradiation to the locally advanced disease in the chest. He recently completed 6 cycles of systemic chemotherapy with carboplatin, Alimta and Avastin and tolerated this treatment fairly well. The patient is currently on maintenance treatment with Alimta and Avastin status post 7 cycles. The patient continues to tolerate this treatment well.  I recommended for him to proceed with cycle #8 today as a schedule. I will see him back for follow-up visit in 3 weeks for evaluation before the next cycle of his treatment. For the pulmonary embolism, he will continue treatment with Xarelto. He was advised to call immediately if he has any concerning symptoms in the interval. The patient voices understanding of current disease status and treatment options and is in  agreement with the current care plan. All questions were answered. The patient knows to call the clinic with any problems, questions or concerns. We can certainly see the patient much sooner if necessary.  Disclaimer: This note was dictated with voice recognition software. Similar sounding words can inadvertently be transcribed and may not be corrected upon review.

## 2017-12-16 ENCOUNTER — Other Ambulatory Visit: Payer: Self-pay | Admitting: Medical Oncology

## 2017-12-16 ENCOUNTER — Other Ambulatory Visit: Payer: 59

## 2017-12-16 ENCOUNTER — Other Ambulatory Visit: Payer: Self-pay | Admitting: Internal Medicine

## 2017-12-16 DIAGNOSIS — I2609 Other pulmonary embolism with acute cor pulmonale: Secondary | ICD-10-CM

## 2017-12-16 DIAGNOSIS — I2782 Chronic pulmonary embolism: Principal | ICD-10-CM

## 2017-12-16 MED ORDER — RIVAROXABAN 20 MG PO TABS: 20.0000 mg | ORAL_TABLET | Freq: Every day | ORAL | 1 refills | Status: DC

## 2017-12-20 ENCOUNTER — Ambulatory Visit: Payer: Self-pay | Admitting: Radiation Oncology

## 2017-12-28 ENCOUNTER — Inpatient Hospital Stay (HOSPITAL_BASED_OUTPATIENT_CLINIC_OR_DEPARTMENT_OTHER): Payer: 59 | Admitting: Internal Medicine

## 2017-12-28 ENCOUNTER — Encounter: Payer: Self-pay | Admitting: Internal Medicine

## 2017-12-28 ENCOUNTER — Inpatient Hospital Stay: Payer: 59 | Attending: Internal Medicine

## 2017-12-28 ENCOUNTER — Inpatient Hospital Stay: Payer: 59

## 2017-12-28 VITALS — BP 135/84 | HR 98 | Resp 16

## 2017-12-28 DIAGNOSIS — C3492 Malignant neoplasm of unspecified part of left bronchus or lung: Secondary | ICD-10-CM

## 2017-12-28 DIAGNOSIS — R110 Nausea: Secondary | ICD-10-CM

## 2017-12-28 DIAGNOSIS — I2699 Other pulmonary embolism without acute cor pulmonale: Secondary | ICD-10-CM

## 2017-12-28 DIAGNOSIS — C7A1 Malignant poorly differentiated neuroendocrine tumors: Secondary | ICD-10-CM | POA: Diagnosis not present

## 2017-12-28 DIAGNOSIS — C3490 Malignant neoplasm of unspecified part of unspecified bronchus or lung: Secondary | ICD-10-CM

## 2017-12-28 DIAGNOSIS — C7B8 Other secondary neuroendocrine tumors: Secondary | ICD-10-CM | POA: Insufficient documentation

## 2017-12-28 DIAGNOSIS — Z5111 Encounter for antineoplastic chemotherapy: Secondary | ICD-10-CM | POA: Diagnosis not present

## 2017-12-28 DIAGNOSIS — Z7901 Long term (current) use of anticoagulants: Secondary | ICD-10-CM | POA: Diagnosis not present

## 2017-12-28 DIAGNOSIS — Z79899 Other long term (current) drug therapy: Secondary | ICD-10-CM | POA: Insufficient documentation

## 2017-12-28 DIAGNOSIS — Z5112 Encounter for antineoplastic immunotherapy: Secondary | ICD-10-CM | POA: Insufficient documentation

## 2017-12-28 LAB — CBC WITH DIFFERENTIAL/PLATELET
Basophils Absolute: 0 10*3/uL (ref 0.0–0.1)
Basophils Relative: 0 %
Eosinophils Absolute: 0 10*3/uL (ref 0.0–0.5)
Eosinophils Relative: 0 %
HCT: 45.4 % (ref 38.4–49.9)
Hemoglobin: 15.4 g/dL (ref 13.0–17.1)
Lymphocytes Relative: 8 %
Lymphs Abs: 0.4 10*3/uL — ABNORMAL LOW (ref 0.9–3.3)
MCH: 37.7 pg — ABNORMAL HIGH (ref 27.2–33.4)
MCHC: 33.9 g/dL (ref 32.0–36.0)
MCV: 111.3 fL — ABNORMAL HIGH (ref 79.3–98.0)
Monocytes Absolute: 0.3 10*3/uL (ref 0.1–0.9)
Monocytes Relative: 6 %
Neutro Abs: 3.7 10*3/uL (ref 1.5–6.5)
Neutrophils Relative %: 86 %
Platelets: 211 10*3/uL (ref 140–400)
RBC: 4.08 MIL/uL — ABNORMAL LOW (ref 4.20–5.82)
RDW: 16.1 % — ABNORMAL HIGH (ref 11.0–14.6)
WBC: 4.3 10*3/uL (ref 4.0–10.3)

## 2017-12-28 LAB — COMPREHENSIVE METABOLIC PANEL
ALT: 31 U/L (ref 0–55)
AST: 22 U/L (ref 5–34)
Albumin: 3.7 g/dL (ref 3.5–5.0)
Alkaline Phosphatase: 95 U/L (ref 40–150)
Anion gap: 12 — ABNORMAL HIGH (ref 3–11)
BUN: 11 mg/dL (ref 7–26)
CO2: 20 mmol/L — ABNORMAL LOW (ref 22–29)
Calcium: 9.8 mg/dL (ref 8.4–10.4)
Chloride: 103 mmol/L (ref 98–109)
Creatinine, Ser: 1.08 mg/dL (ref 0.70–1.30)
GFR calc Af Amer: >60 mL/min (ref >60)
GFR calc non Af Amer: >60 mL/min (ref >60)
Glucose, Bld: 196 mg/dL — ABNORMAL HIGH (ref 70–140)
Potassium: 4.4 mmol/L (ref 3.5–5.1)
Sodium: 135 mmol/L — ABNORMAL LOW (ref 136–145)
Total Bilirubin: 0.8 mg/dL (ref 0.2–1.2)
Total Protein: 7.8 g/dL (ref 6.4–8.3)

## 2017-12-28 LAB — TOTAL PROTEIN, URINE DIPSTICK: Protein, ur: NEGATIVE mg/dL

## 2017-12-28 MED ORDER — SODIUM CHLORIDE 0.9 % IV SOLN
15.2000 mg/kg | Freq: Once | INTRAVENOUS | Status: AC
  Administered 2017-12-28: 1600 mg via INTRAVENOUS
  Filled 2017-12-28: qty 64

## 2017-12-28 MED ORDER — CYANOCOBALAMIN 1000 MCG/ML IJ SOLN
INTRAMUSCULAR | Status: AC
  Filled 2017-12-28: qty 1

## 2017-12-28 MED ORDER — SODIUM CHLORIDE 0.9 % IV SOLN
510.0000 mg/m2 | Freq: Once | INTRAVENOUS | Status: AC
  Administered 2017-12-28: 1200 mg via INTRAVENOUS
  Filled 2017-12-28: qty 40

## 2017-12-28 MED ORDER — PROCHLORPERAZINE MALEATE 10 MG PO TABS
10.0000 mg | ORAL_TABLET | Freq: Once | ORAL | Status: AC
  Administered 2017-12-28: 10 mg via ORAL

## 2017-12-28 MED ORDER — PROCHLORPERAZINE MALEATE 10 MG PO TABS
ORAL_TABLET | ORAL | Status: AC
  Filled 2017-12-28: qty 1

## 2017-12-28 MED ORDER — SODIUM CHLORIDE 0.9 % IV SOLN
Freq: Once | INTRAVENOUS | Status: AC
  Administered 2017-12-28: 11:00:00 via INTRAVENOUS

## 2017-12-28 MED ORDER — CYANOCOBALAMIN 1000 MCG/ML IJ SOLN
1000.0000 ug | Freq: Once | INTRAMUSCULAR | Status: AC
  Administered 2017-12-28: 1000 ug via INTRAMUSCULAR

## 2017-12-28 NOTE — Patient Instructions (Signed)
[facility] Discharge Instructions for Patients Receiving Chemotherapy  Today you received the following chemotherapy agents:  Avastin and Alimta.  To help prevent nausea and vomiting after your treatment, we encourage you to take your nausea medication as directed.   If you develop nausea and vomiting that is not controlled by your nausea medication, call the clinic.   BELOW ARE SYMPTOMS THAT SHOULD BE REPORTED IMMEDIATELY:  *FEVER GREATER THAN 100.5 F  *CHILLS WITH OR WITHOUT FEVER  NAUSEA AND VOMITING THAT IS NOT CONTROLLED WITH YOUR NAUSEA MEDICATION  *UNUSUAL SHORTNESS OF BREATH  *UNUSUAL BRUISING OR BLEEDING  TENDERNESS IN MOUTH AND THROAT WITH OR WITHOUT PRESENCE OF ULCERS  *URINARY PROBLEMS  *BOWEL PROBLEMS  UNUSUAL RASH Items with * indicate a potential emergency and should be followed up as soon as possible.  Feel free to call the clinic should you have any questions or concerns. The clinic phone number is [phone].  Please show the [facility] at check-in to the Emergency Department and triage nurse.

## 2017-12-28 NOTE — Progress Notes (Signed)
[facility] Telephone:([phone]   [phone]  OFFICE PROGRESS NOTE  [name], MD 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  PRIOR THERAPY:  1) Stereotactic radiotherapy to a solitary brain metastasis under the care of Dr. Lisbeth Renshaw on 01/25/2017. 2) Short course of concurrent chemoradiation with weekly carboplatin for AUC of 2 and paclitaxel 45 MG/M2 to the locally advanced disease in the chest. Status post 3 cycles. 3) Systemic chemotherapy with carboplatin for AUC of 5, Alimta 500 MG/M2 and Avastin 15 MG/KG every 3 weeks. First dose of 02/15/2017. Status post 6 cycles. Last dose 06/08/2017 with stable disease.   CURRENT THERAPY: Maintenance systemic chemotherapy with Alimta 500 MG/M2 in addition to Avastin 15 MG/KG every 3 weeks. First dose 07/13/2017.  Status post 8 cycles.  INTERVAL HISTORY: Calvin Wagner 52 y.o. male returns to the clinic today for follow-up visit accompanied by his wife.  The patient has no complaints today except for persistent nausea for several days after his chemotherapy.  It improves with Compazine but he would like to try different medication for his nausea.  He denied having any recent chest pain but continues to have cough with no hemoptysis.  He has shortness of breath with exertion.  He denied having any recent weight loss or night sweats.  He has no fever or chills.  He also continues to have swelling of the lower extremities. He is here today for evaluation before starting cycle #9.  MEDICAL HISTORY: Past Medical History:  Diagnosis Date  . Encounter for antineoplastic chemotherapy 12/31/2016  . GERD  11/08/2009   Qualifier: Diagnosis of  By: Ronnald Ramp MD, Arvid Right Goals of care, counseling/discussion 12/31/2016  . History of radiation therapy 01/13/2017 - 02/02/2017   Site/dose:  Left lung: 30 Gy in 15 fractions  . HYPERTENSION 11/08/2009   Qualifier: Diagnosis of  By: Ronnald Ramp MD, Arvid Right.   . Large cell carcinoma of left lung, stage 4 ([place]) 12/31/2016  . Migraine 02/25/2012  . Pneumonia   . PONV (postoperative nausea and vomiting)   . Rotator cuff tear, left     ALLERGIES:  is allergic to no known allergies.  MEDICATIONS:  Current Outpatient Medications  Medication Sig Dispense Refill  . amLODipine (NORVASC) 5 MG tablet Take 5 mg by mouth daily.    . budesonide-formoterol (SYMBICORT) 80-4.5 MCG/ACT inhaler Inhale 2 puffs into the lungs 2 (two) times daily. 1 Inhaler 5  . chlorpheniramine-HYDROcodone (TUSSIONEX) 10-8 MG/5ML SUER Take 5 mLs by mouth every 12 (twelve) hours as needed for cough. 473 mL 0  . dexamethasone (DECADRON) 4 MG tablet 4 mg by mouth twice a day the day before, day of and day after the chemotherapy 40 tablet 1  . folic acid (FOLVITE) 1 MG tablet Take 1 tablet (1 mg total) by mouth daily. 30 tablet 4  . furosemide (LASIX) 20 MG tablet Take 1 tablet (20 mg total) by mouth daily as needed. 20 tablet 0  . LORazepam (ATIVAN) 0.5 MG tablet 1 tablet po q 4-6 hours prn anxiety and one po 30 minutes prior to radiation or MRI 30 tablet 0  . losartan (COZAAR) 50 MG tablet Take 50 mg  by mouth daily.    . Multiple Vitamin (MULTIVITAMIN WITH MINERALS) TABS tablet Take 1 tablet by mouth daily.    . pantoprazole (PROTONIX) 40 MG tablet Take 40 mg by mouth daily.  2  . prochlorperazine (COMPAZINE) 10 MG tablet TAKE 1 TABLET BY MOUTH  EVERY 6 HOURS AS NEEDED FOR NAUSEA OR VOMITING 30 tablet 0  . prochlorperazine (COMPAZINE) 10 MG tablet TAKE 1 TABLET BY MOUTH  EVERY 6 HOURS AS NEEDED FOR NAUSEA OR VOMITING 30 tablet 1  . rivaroxaban (XARELTO) 20 MG TABS tablet Take 1 tablet (20 mg  total) by mouth daily with supper. 30 tablet 1  . traMADol (ULTRAM) 50 MG tablet Take 1 tablet (50 mg total) by mouth every 12 (twelve) hours as needed. 30 tablet 0  . XARELTO STARTER PACK 15 & 20 MG TBPK TAKE AS DIRECTED ON PACKAGE 51 each 0   No current facility-administered medications for this visit.     SURGICAL HISTORY:  Past Surgical History:  Procedure Laterality Date  . INGUINAL HERNIA REPAIR    . SHOULDER ARTHROSCOPY WITH ROTATOR CUFF REPAIR Left 11/12/2016   Procedure: SHOULDER ARTHROSCOPY WITH ROTATOR CUFF REPAIR AND SUBACROMIAL DECOMPRESSION;  Surgeon: Tania Ade, MD;  Location: [facility];  Service: Orthopedics;  Laterality: Left;  SHOULDER ARTHROSCOPY WITH ROTATOR CUFF REPAIR AND SUBACROMIAL DECOMPRESSION  . TONSILLECTOMY    . TOTAL KNEE ARTHROPLASTY    . VIDEO BRONCHOSCOPY Bilateral 12/10/2016   Procedure: VIDEO BRONCHOSCOPY WITHOUT FLUORO;  Surgeon: Tanda Rockers, MD;  Location: [facility];  Service: Cardiopulmonary;  Laterality: Bilateral;    REVIEW OF SYSTEMS:  A comprehensive review of systems was negative except for: Constitutional: positive for fatigue Respiratory: positive for cough and dyspnea on exertion Gastrointestinal: positive for nausea   PHYSICAL EXAMINATION: General appearance: alert, cooperative, fatigued and no distress Head: Normocephalic, without obvious abnormality, atraumatic Neck: no adenopathy, no JVD, supple, symmetrical, trachea midline and thyroid not enlarged, symmetric, no tenderness/mass/nodules Lymph nodes: Cervical, supraclavicular, and axillary nodes normal. Resp: clear to auscultation bilaterally Back: symmetric, no curvature. ROM normal. No CVA tenderness. Cardio: regular rate and rhythm, S1, S2 normal, no murmur, click, rub or gallop GI: soft, non-tender; bowel sounds normal; no masses,  no organomegaly Extremities: edema 1+  ECOG PERFORMANCE STATUS: 1 - Symptomatic but completely ambulatory  Blood pressure (!) 144/95, pulse (!)  113, temperature 97.9 F (36.6 C), temperature source Oral, resp. rate 18, height 6\' 1"  (1.854 m), weight 242 lb 3.2 oz (109.9 kg), SpO2 100 %.  LABORATORY DATA: Lab Results  Component Value Date   WBC 5.3 12/07/2017   HGB 14.8 12/07/2017   HCT 45.1 12/07/2017   MCV 112.8 (H) 12/07/2017   PLT 191 12/07/2017      Chemistry      Component Value Date/Time   NA 137 12/07/2017 1310   NA 138 10/08/2017 0907   K 4.4 12/07/2017 1310   K 4.1 10/08/2017 0907   CL 101 12/07/2017 1310   CO2 26 12/07/2017 1310   CO2 24 10/08/2017 0907   BUN 11 12/07/2017 1310   BUN 11.7 10/08/2017 0907   CREATININE 1.21 12/07/2017 1310   CREATININE 1.2 10/08/2017 0907      Component Value Date/Time   CALCIUM 9.7 12/07/2017 1310   CALCIUM 9.5 10/08/2017 0907   ALKPHOS 92 12/07/2017 1310   ALKPHOS 88 10/08/2017 0907   AST 27 12/07/2017 1310   AST 22 10/08/2017 0907   ALT 35 12/07/2017 1310   ALT 24  10/08/2017 0907   BILITOT 0.6 12/07/2017 1310   BILITOT 0.42 10/08/2017 0907       RADIOGRAPHIC STUDIES: No results found.  ASSESSMENT AND PLAN: This is a very pleasant 52 years old white male with stage IV non-small cell lung cancer, high-grade neuroendocrine carcinoma presented with locally advanced disease in the chest as well as solitary brain metastasis and 2 liver lesions. The patient is status post stereotactic radiotherapy to the solitary brain metastasis as well as short course of concurrent chemoradiation to the locally advanced disease in the chest. He recently completed 6 cycles of systemic chemotherapy with carboplatin, Alimta and Avastin and tolerated this treatment fairly well. The patient is currently on maintenance treatment with Alimta and Avastin status post 8 cycles. The patient tolerated the last cycle of his treatment well except for the delayed nausea for several days as well as fatigue. I will start the patient on Zofran 8 mg p.o. every 8 hours as needed for nausea. For the  pulmonary embolism, he will continue treatment with Xarelto. I will see him back for follow-up visit in 3 weeks for evaluation after repeating CT scan of the chest, abdomen and pelvis for restaging of his disease. The patient was advised to call immediately if he has any concerning symptoms in the interval. The patient voices understanding of current disease status and treatment options and is in agreement with the current care plan. All questions were answered. The patient knows to call the clinic with any problems, questions or concerns. We can certainly see the patient much sooner if necessary.  Disclaimer: This note was dictated with voice recognition software. Similar sounding words can inadvertently be transcribed and may not be corrected upon review.

## 2017-12-29 ENCOUNTER — Telehealth: Payer: Self-pay | Admitting: Internal Medicine

## 2017-12-29 NOTE — Telephone Encounter (Signed)
Scheduled appt per 3/19 los - Patient to get an updated schedule next visit. / also my chart active.

## 2017-12-30 ENCOUNTER — Other Ambulatory Visit: Payer: Self-pay | Admitting: Internal Medicine

## 2017-12-30 DIAGNOSIS — C3492 Malignant neoplasm of unspecified part of left bronchus or lung: Secondary | ICD-10-CM

## 2018-01-03 ENCOUNTER — Other Ambulatory Visit: Payer: 59

## 2018-01-05 ENCOUNTER — Inpatient Hospital Stay
Admission: RE | Admit: 2018-01-05 | Discharge: 2018-01-05 | Disposition: A | Discharge status: Home / Self Care | Payer: Self-pay | Source: Ambulatory Visit | Attending: Radiation Oncology | Admitting: Radiation Oncology

## 2018-01-12 ENCOUNTER — Telehealth: Payer: Self-pay | Admitting: *Deleted

## 2018-01-12 NOTE — Telephone Encounter (Signed)
Oncology Nurse Navigator Documentation  Oncology Nurse Navigator Flowsheets 01/12/2018  Navigator Location [facility]  Navigator Encounter Type Telephone/I followed up on [name] schedule. Per Dr. Worthy Flank last note, patient needs ct scan. I called [name] and left him a vm message to call to assist him with making appt for scan.   Telephone Outgoing Call  Treatment Phase Treatment  Barriers/Navigation Needs Education  Education Other  Interventions Education  Education Method Verbal  Acuity Level 1  Time Spent with Patient 30

## 2018-01-13 ENCOUNTER — Telehealth: Payer: Self-pay | Admitting: *Deleted

## 2018-01-13 NOTE — Telephone Encounter (Signed)
Oncology Nurse Navigator Documentation  Oncology Nurse Navigator Flowsheets 01/13/2018  Navigator Location [facility]  Navigator Encounter Type Telephone/I received a message patient family called. I called patient back and left vm message. I clarified why I was calling and if he needed to call me back. I also gave my name and phone number.  Telephone Outgoing Call  Treatment Phase Treatment  Barriers/Navigation Needs Education  Education Other  Interventions Education  Education Method Verbal  Acuity Level 1  Time Spent with Patient 15

## 2018-01-14 ENCOUNTER — Ambulatory Visit
Admission: RE | Admit: 2018-01-14 | Discharge: 2018-01-14 | Disposition: A | Discharge status: Home / Self Care | Payer: 59 | Source: Ambulatory Visit | Attending: Radiation Oncology | Admitting: Radiation Oncology

## 2018-01-14 DIAGNOSIS — C7931 Secondary malignant neoplasm of brain: Secondary | ICD-10-CM

## 2018-01-14 DIAGNOSIS — C7949 Secondary malignant neoplasm of other parts of nervous system: Principal | ICD-10-CM

## 2018-01-14 MED ORDER — GADOBENATE DIMEGLUMINE 529 MG/ML IV SOLN
20.0000 mL | Freq: Once | INTRAVENOUS | Status: AC | PRN
  Administered 2018-01-14: 20 mL via INTRAVENOUS

## 2018-01-17 ENCOUNTER — Ambulatory Visit (HOSPITAL_COMMUNITY)
Admission: RE | Admit: 2018-01-17 | Discharge: 2018-01-17 | Disposition: A | Discharge status: Home / Self Care | Payer: 59 | Source: Ambulatory Visit | Attending: Internal Medicine | Admitting: Internal Medicine

## 2018-01-17 ENCOUNTER — Ambulatory Visit
Admission: RE | Admit: 2018-01-17 | Discharge: 2018-01-17 | Disposition: A | Discharge status: Home / Self Care | Payer: 59 | Source: Ambulatory Visit | Attending: Radiation Oncology | Admitting: Radiation Oncology

## 2018-01-17 ENCOUNTER — Encounter: Payer: Self-pay | Admitting: *Deleted

## 2018-01-17 ENCOUNTER — Telehealth: Payer: Self-pay | Admitting: Radiation Oncology

## 2018-01-17 DIAGNOSIS — C3490 Malignant neoplasm of unspecified part of unspecified bronchus or lung: Secondary | ICD-10-CM | POA: Diagnosis present

## 2018-01-17 DIAGNOSIS — I700 Atherosclerosis of aorta: Secondary | ICD-10-CM | POA: Insufficient documentation

## 2018-01-17 DIAGNOSIS — M4184 Other forms of scoliosis, thoracic region: Secondary | ICD-10-CM | POA: Diagnosis not present

## 2018-01-17 DIAGNOSIS — R911 Solitary pulmonary nodule: Secondary | ICD-10-CM | POA: Diagnosis not present

## 2018-01-17 DIAGNOSIS — R918 Other nonspecific abnormal finding of lung field: Secondary | ICD-10-CM | POA: Diagnosis not present

## 2018-01-17 MED ORDER — IOHEXOL 300 MG/ML  SOLN
100.0000 mL | Freq: Once | INTRAMUSCULAR | Status: AC | PRN
  Administered 2018-01-17: 100 mL via INTRAVENOUS

## 2018-01-17 NOTE — Progress Notes (Signed)
[id number] Called no answer left a message on his cell phone to call [phone].   [phone] Called Mrs. Medlock telephone number spoke with her they had the appointment written down for tomorrow.  Mentioned I would speak with Shona Simpson PA-C about coming in today at 1400 and call her back. [place] spoke with Mrs. Corona again to let her know  Shona Simpson PA-C will call her back with the MRI brain scan result today Bryson Ha said the scan result is good;also they will not need to come in for the follow up appointment.

## 2018-01-17 NOTE — Telephone Encounter (Signed)
The patient missed his appointment today but his wife and I discussed by phone his MRI imaging. The plan is to reimage in 3 months. He is having some headaches and dizziness that his wife believes to be the result of blood pressure issues.

## 2018-01-18 ENCOUNTER — Inpatient Hospital Stay: Payer: 59

## 2018-01-18 ENCOUNTER — Encounter: Payer: Self-pay | Admitting: Internal Medicine

## 2018-01-18 ENCOUNTER — Inpatient Hospital Stay (HOSPITAL_BASED_OUTPATIENT_CLINIC_OR_DEPARTMENT_OTHER): Payer: 59 | Admitting: Internal Medicine

## 2018-01-18 ENCOUNTER — Inpatient Hospital Stay: Payer: 59 | Attending: Internal Medicine

## 2018-01-18 ENCOUNTER — Telehealth: Payer: Self-pay | Admitting: Internal Medicine

## 2018-01-18 ENCOUNTER — Other Ambulatory Visit: Payer: Self-pay

## 2018-01-18 VITALS — BP 162/97 | HR 97 | Temp 98.3°F | Resp 18

## 2018-01-18 VITALS — BP 146/95 | HR 106 | Temp 98.5°F | Resp 18 | Ht 73.0 in | Wt 243.9 lb

## 2018-01-18 DIAGNOSIS — C7A1 Malignant poorly differentiated neuroendocrine tumors: Secondary | ICD-10-CM | POA: Insufficient documentation

## 2018-01-18 DIAGNOSIS — R110 Nausea: Secondary | ICD-10-CM | POA: Insufficient documentation

## 2018-01-18 DIAGNOSIS — Z923 Personal history of irradiation: Secondary | ICD-10-CM | POA: Diagnosis not present

## 2018-01-18 DIAGNOSIS — C7B8 Other secondary neuroendocrine tumors: Secondary | ICD-10-CM | POA: Insufficient documentation

## 2018-01-18 DIAGNOSIS — Z5111 Encounter for antineoplastic chemotherapy: Secondary | ICD-10-CM | POA: Insufficient documentation

## 2018-01-18 DIAGNOSIS — I10 Essential (primary) hypertension: Secondary | ICD-10-CM | POA: Insufficient documentation

## 2018-01-18 DIAGNOSIS — I700 Atherosclerosis of aorta: Secondary | ICD-10-CM | POA: Diagnosis not present

## 2018-01-18 DIAGNOSIS — Z79899 Other long term (current) drug therapy: Secondary | ICD-10-CM | POA: Diagnosis not present

## 2018-01-18 DIAGNOSIS — Z5112 Encounter for antineoplastic immunotherapy: Secondary | ICD-10-CM | POA: Diagnosis not present

## 2018-01-18 DIAGNOSIS — M7989 Other specified soft tissue disorders: Secondary | ICD-10-CM | POA: Diagnosis not present

## 2018-01-18 DIAGNOSIS — C7931 Secondary malignant neoplasm of brain: Secondary | ICD-10-CM

## 2018-01-18 DIAGNOSIS — C3492 Malignant neoplasm of unspecified part of left bronchus or lung: Secondary | ICD-10-CM

## 2018-01-18 LAB — COMPREHENSIVE METABOLIC PANEL WITH GFR
ALT: 32 U/L (ref 0–55)
AST: 24 U/L (ref 5–34)
Albumin: 3.4 g/dL — ABNORMAL LOW (ref 3.5–5.0)
Alkaline Phosphatase: 87 U/L (ref 40–150)
Anion gap: 9 (ref 3–11)
BUN: 10 mg/dL (ref 7–26)
CO2: 24 mmol/L (ref 22–29)
Calcium: 9.4 mg/dL (ref 8.4–10.4)
Chloride: 104 mmol/L (ref 98–109)
Creatinine, Ser: 1.15 mg/dL (ref 0.70–1.30)
GFR calc Af Amer: >60 mL/min (ref >60)
GFR calc non Af Amer: >60 mL/min (ref >60)
Glucose, Bld: 159 mg/dL — ABNORMAL HIGH (ref 70–140)
Potassium: 3.9 mmol/L (ref 3.5–5.1)
Sodium: 137 mmol/L (ref 136–145)
Total Bilirubin: 0.4 mg/dL (ref 0.2–1.2)
Total Protein: 7.1 g/dL (ref 6.4–8.3)

## 2018-01-18 LAB — CBC WITH DIFFERENTIAL/PLATELET
Basophils Absolute: 0 10*3/uL (ref 0.0–0.1)
Basophils Relative: 0 %
Eosinophils Absolute: 0.1 10*3/uL (ref 0.0–0.5)
Eosinophils Relative: 4 %
HCT: 43.1 % (ref 38.4–49.9)
Hemoglobin: 14.5 g/dL (ref 13.0–17.1)
Lymphocytes Relative: 20 %
Lymphs Abs: 0.6 10*3/uL — ABNORMAL LOW (ref 0.9–3.3)
MCH: 38.2 pg — ABNORMAL HIGH (ref 27.2–33.4)
MCHC: 33.6 g/dL (ref 32.0–36.0)
MCV: 113.4 fL — ABNORMAL HIGH (ref 79.3–98.0)
Monocytes Absolute: 0.5 10*3/uL (ref 0.1–0.9)
Monocytes Relative: 16 %
Neutro Abs: 1.8 10*3/uL (ref 1.5–6.5)
Neutrophils Relative %: 60 %
Platelets: 186 10*3/uL (ref 140–400)
RBC: 3.8 MIL/uL — ABNORMAL LOW (ref 4.20–5.82)
RDW: 16.1 % — ABNORMAL HIGH (ref 11.0–14.6)
WBC: 3 10*3/uL — ABNORMAL LOW (ref 4.0–10.3)

## 2018-01-18 LAB — TOTAL PROTEIN, URINE DIPSTICK: Protein, ur: NEGATIVE mg/dL

## 2018-01-18 MED ORDER — SODIUM CHLORIDE 0.9 % IV SOLN
1200.0000 mg | Freq: Once | INTRAVENOUS | Status: AC
  Administered 2018-01-18: 1200 mg via INTRAVENOUS
  Filled 2018-01-18: qty 40

## 2018-01-18 MED ORDER — PROCHLORPERAZINE MALEATE 10 MG PO TABS: ORAL_TABLET | ORAL | 1 refills | Status: DC

## 2018-01-18 MED ORDER — DEXAMETHASONE 4 MG PO TABS: ORAL_TABLET | ORAL | 1 refills | Status: DC

## 2018-01-18 MED ORDER — PROCHLORPERAZINE MALEATE 10 MG PO TABS
ORAL_TABLET | ORAL | Status: AC
Start: 2018-01-18 — End: ?
  Filled 2018-01-18: qty 1

## 2018-01-18 MED ORDER — SODIUM CHLORIDE 0.9 % IV SOLN
1600.0000 mg | Freq: Once | INTRAVENOUS | Status: AC
  Administered 2018-01-18: 1600 mg via INTRAVENOUS
  Filled 2018-01-18: qty 64

## 2018-01-18 MED ORDER — PROCHLORPERAZINE MALEATE 10 MG PO TABS
10.0000 mg | ORAL_TABLET | Freq: Once | ORAL | Status: AC
  Administered 2018-01-18: 10 mg via ORAL

## 2018-01-18 MED ORDER — ONDANSETRON HCL 8 MG PO TABS: 8.0000 mg | ORAL_TABLET | Freq: Three times a day (TID) | ORAL | 0 refills | Status: DC | PRN

## 2018-01-18 MED ORDER — SODIUM CHLORIDE 0.9 % IV SOLN
Freq: Once | INTRAVENOUS | Status: AC
  Administered 2018-01-18: 12:00:00 via INTRAVENOUS

## 2018-01-18 NOTE — Telephone Encounter (Signed)
3 cycles already scheduled per 4/9 los - no additional appts to add at the moment.

## 2018-01-18 NOTE — Patient Instructions (Signed)
[facility] Discharge Instructions for Patients Receiving Chemotherapy  Today you received the following chemotherapy agents:  Avastin and Alimta.  To help prevent nausea and vomiting after your treatment, we encourage you to take your nausea medication as directed.   If you develop nausea and vomiting that is not controlled by your nausea medication, call the clinic.   BELOW ARE SYMPTOMS THAT SHOULD BE REPORTED IMMEDIATELY:  *FEVER GREATER THAN 100.5 F  *CHILLS WITH OR WITHOUT FEVER  NAUSEA AND VOMITING THAT IS NOT CONTROLLED WITH YOUR NAUSEA MEDICATION  *UNUSUAL SHORTNESS OF BREATH  *UNUSUAL BRUISING OR BLEEDING  TENDERNESS IN MOUTH AND THROAT WITH OR WITHOUT PRESENCE OF ULCERS  *URINARY PROBLEMS  *BOWEL PROBLEMS  UNUSUAL RASH Items with * indicate a potential emergency and should be followed up as soon as possible.  Feel free to call the clinic should you have any questions or concerns. The clinic phone number is [phone].  Please show the [facility] at check-in to the Emergency Department and triage nurse.

## 2018-01-18 NOTE — Progress Notes (Signed)
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  PRIOR THERAPY:  1) Stereotactic radiotherapy to a solitary brain metastasis under the care of Dr. Lisbeth Renshaw on 01/25/2017. 2) Short course of concurrent chemoradiation with weekly carboplatin for AUC of 2 and paclitaxel 45 MG/M2 to the locally advanced disease in the chest. Status post 3 cycles. 3) Systemic chemotherapy with carboplatin for AUC of 5, Alimta 500 MG/M2 and Avastin 15 MG/KG every 3 weeks. First dose of 02/15/2017. Status post 6 cycles. Last dose 06/08/2017 with stable disease.   CURRENT THERAPY: Maintenance systemic chemotherapy with Alimta 500 MG/M2 in addition to Avastin 15 MG/KG every 3 weeks. First dose 07/13/2017.  Status post 9 cycles.  INTERVAL HISTORY: Calvin Wagner 52 y.o. male returns to the clinic today for follow-up visit accompanied by his wife.  The patient is feeling fine with no specific complaints.  He continues to tolerate his treatment with Alimta and Avastin fairly well.  He has delayed nausea that last for around 10 days and he is currently taking Compazine.  He denied having any chest pain, shortness of breath but continues to have dry cough with no hemoptysis.  He has no significant weight loss or night sweats.  He has no fever or chills.    MEDICAL HISTORY: Past Medical History:  Diagnosis Date  . Encounter for antineoplastic chemotherapy 12/31/2016  . GERD 11/08/2009   Qualifier: Diagnosis of  By: Ronnald Ramp MD, Arvid Right Goals of care, counseling/discussion 12/31/2016  . History  of radiation therapy 01/13/2017 - 02/02/2017   Site/dose:  Left lung: 30 Gy in 15 fractions  . HYPERTENSION 11/08/2009   Qualifier: Diagnosis of  By: Ronnald Ramp MD, Arvid Right.   . 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 Migraine 02/25/2012  . Pneumonia   . PONV (postoperative nausea and vomiting)   . Rotator cuff tear, left     ALLERGIES:  is allergic to no known allergies.  MEDICATIONS:  Current Outpatient Medications  Medication Sig Dispense Refill  . amLODipine (NORVASC) 5 MG tablet Take 5 mg by mouth daily.    . budesonide-formoterol (SYMBICORT) 80-4.5 MCG/ACT inhaler Inhale 2 puffs into the lungs 2 (two) times daily. 1 Inhaler 5  . chlorpheniramine-HYDROcodone (TUSSIONEX) 10-8 MG/5ML SUER Take 5 mLs by mouth every 12 (twelve) hours as needed for cough. 473 mL 0  . dexamethasone (DECADRON) 4 MG tablet 4 mg by mouth twice a day the day before, day of and day after the chemotherapy 40 tablet 1  . folic acid (FOLVITE) 1 MG tablet Take 1 tablet (1 mg total) by mouth daily. 30 tablet 4  . furosemide (LASIX) 20 MG tablet Take 1 tablet (20 mg total) by mouth daily as needed. 20 tablet 0  . LORazepam (ATIVAN) 0.5 MG tablet 1 tablet po q 4-6 hours prn anxiety and one po 30 minutes prior to radiation or MRI 30 tablet 0  . losartan (COZAAR) 50 MG tablet Take 50 mg by mouth daily.    . Multiple Vitamin (MULTIVITAMIN WITH MINERALS) TABS tablet Take 1 tablet by  mouth daily.    . pantoprazole (PROTONIX) 40 MG tablet Take 40 mg by mouth daily.  2  . prochlorperazine (COMPAZINE) 10 MG tablet TAKE 1 TABLET BY MOUTH  EVERY 6 HOURS AS NEEDED FOR NAUSEA OR VOMITING 30 tablet 0  . prochlorperazine (COMPAZINE) 10 MG tablet TAKE 1 TABLET BY MOUTH  EVERY 6 HOURS AS NEEDED FOR NAUSEA OR VOMITING 30 tablet 1  . rivaroxaban (XARELTO) 20 MG TABS tablet Take 1 tablet (20 mg total) by mouth daily with supper. 30 tablet 1  . traMADol (ULTRAM) 50 MG tablet TAKE 1 TABLET BY MOUTH EVERY 12 HOURS AS  NEEDED 30 tablet 0   No current facility-administered medications for this visit.     SURGICAL HISTORY:  Past Surgical History:  Procedure Laterality Date  . INGUINAL HERNIA REPAIR    . SHOULDER ARTHROSCOPY WITH ROTATOR CUFF REPAIR Left 11/12/2016   Procedure: SHOULDER ARTHROSCOPY WITH ROTATOR CUFF REPAIR AND SUBACROMIAL DECOMPRESSION;  Surgeon: Tania Ade, MD;  Location: [facility];  Service: Orthopedics;  Laterality: Left;  SHOULDER ARTHROSCOPY WITH ROTATOR CUFF REPAIR AND SUBACROMIAL DECOMPRESSION  . TONSILLECTOMY    . TOTAL KNEE ARTHROPLASTY    . VIDEO BRONCHOSCOPY Bilateral 12/10/2016   Procedure: VIDEO BRONCHOSCOPY WITHOUT FLUORO;  Surgeon: Tanda Rockers, MD;  Location: [facility];  Service: Cardiopulmonary;  Laterality: Bilateral;    REVIEW OF SYSTEMS:  Constitutional: positive for fatigue Eyes: negative Ears, nose, mouth, throat, and face: negative Respiratory: positive for cough Cardiovascular: negative Gastrointestinal: negative Genitourinary:negative Integument/breast: negative Hematologic/lymphatic: negative Musculoskeletal:negative Neurological: negative Behavioral/Psych: negative Endocrine: negative Allergic/Immunologic: negative   PHYSICAL EXAMINATION: General appearance: alert, cooperative, fatigued and no distress Head: Normocephalic, without obvious abnormality, atraumatic Neck: no adenopathy, no JVD, supple, symmetrical, trachea midline and thyroid not enlarged, symmetric, no tenderness/mass/nodules Lymph nodes: Cervical, supraclavicular, and axillary nodes normal. Resp: clear to auscultation bilaterally Back: symmetric, no curvature. ROM normal. No CVA tenderness. Cardio: regular rate and rhythm, S1, S2 normal, no murmur, click, rub or gallop GI: soft, non-tender; bowel sounds normal; no masses,  no organomegaly Extremities: edema 1+ Neurologic: Alert and oriented X 3, normal strength and tone. Normal symmetric reflexes. Normal coordination and gait  ECOG  PERFORMANCE STATUS: 1 - Symptomatic but completely ambulatory  Blood pressure (!) 146/95, pulse (!) 106, temperature 98.5 F (36.9 C), temperature source Oral, resp. rate 18, height 6\' 1"  (1.854 m), weight 243 lb 14.4 oz (110.6 kg), SpO2 100 %.  LABORATORY DATA: Lab Results  Component Value Date   WBC 3.0 (L) 01/18/2018   HGB 14.5 01/18/2018   HCT 43.1 01/18/2018   MCV 113.4 (H) 01/18/2018   PLT 186 01/18/2018      Chemistry      Component Value Date/Time   NA 135 (L) 12/28/2017 1017   NA 138 10/08/2017 0907   K 4.4 12/28/2017 1017   K 4.1 10/08/2017 0907   CL 103 12/28/2017 1017   CO2 20 (L) 12/28/2017 1017   CO2 24 10/08/2017 0907   BUN 11 12/28/2017 1017   BUN 11.7 10/08/2017 0907   CREATININE 1.08 12/28/2017 1017   CREATININE 1.2 10/08/2017 0907      Component Value Date/Time   CALCIUM 9.8 12/28/2017 1017   CALCIUM 9.5 10/08/2017 0907   ALKPHOS 95 12/28/2017 1017   ALKPHOS 88 10/08/2017 0907   AST 22 12/28/2017 1017   AST 22 10/08/2017 0907   ALT 31 12/28/2017 1017   ALT 24 10/08/2017 0907   BILITOT 0.8 12/28/2017 1017  BILITOT 0.42 10/08/2017 4431       RADIOGRAPHIC STUDIES: Ct Chest W Contrast  Result Date: 01/17/2018 CLINICAL DATA:  Metastatic non-small cell lung cancer with poorly differentiated high-grade neuroendocrine carcinoma, status post stereotactic radiotherapy of a solitary brain metastatic lesion and chemo radiation as well as systemic chemotherapy. Patient currently on maintenance systemic chemotherapy. EXAM: CT CHEST, ABDOMEN, AND PELVIS WITH CONTRAST TECHNIQUE: Multidetector CT imaging of the chest, abdomen and pelvis was performed following the standard protocol during bolus administration of intravenous contrast. CONTRAST:  175mL OMNIPAQUE IOHEXOL 300 MG/ML  SOLN COMPARISON:  11/12/2017 FINDINGS: CT CHEST FINDINGS Cardiovascular: Unremarkable Mediastinum/Nodes: No pathologic thoracic adenopathy. Lungs/Pleura: Stable left perihilar ground-glass  opacities attributable to prior radiation therapy. No solid progressive nodularity in this vicinity. Posterolaterally along the left costophrenic angle, an intrapulmonary nodule measures 1.1 by 0.9 cm on image 122/4, and by my measurements was previously 1.0 by 0.9 cm. On the prior PET-CT from 12/17/2016 this lesion measured 1.1 cm in long axis and was mildly hypermetabolic. No additional or new significant nodule is observed. Musculoskeletal: Levoconvex upper thoracic scoliosis. No compelling findings of osseous metastatic disease. CT ABDOMEN PELVIS FINDINGS Hepatobiliary: Suspected hepatic steatosis without focal lesion observed. Gallbladder unremarkable. Pancreas: Unremarkable. Spleen: Unremarkable.  Small accessory spleen noted. Adrenals/Urinary Tract: Normal adrenal glands. Exophytic 9 mm hypodense lesion from the right kidney lower pole, likely a cyst technically too small to characterize. The kidneys, ureters, and urinary bladder appear otherwise unremarkable. Stomach/Bowel: Unremarkable Vascular/Lymphatic: Mild aortoiliac atherosclerotic vascular disease. Left external iliac lymph node 9 mm in diameter, previously 10 mm. Right external iliac lymph node 9 mm in diameter, previously 10 mm. No overt pathologic adenopathy. Reproductive: Stable hazy stranding around the seminal vesicles, otherwise unremarkable. Other: No supplemental non-categorized findings. Musculoskeletal: Hernia mesh along the lower pelvis anteriorly. Degenerative disc disease at L5-S1. Facet arthropathy in the lower lumbar spine especially at L3-4. Foraminal impingement on the right at L3-4, L4-5, and L5-S1; and on the left at L4-5 and L5-S1. There is 3 mm of degenerative grade 1 retrolisthesis at L4-5. IMPRESSION: 1. Essentially stable left lower lobe pulmonary nodule. This measures 1.1 cm in long axis, as it did on the prior PET-CT from 12/17/2016 where the nodule demonstrated low-grade accentuated metabolic activity. No significant  progression or new nodule. 2. Left perihilar ground-glass opacity favoring radiation pneumonitis/radiation fibrosis. 3. Other imaging findings of potential clinical significance: Levoconvex upper thoracic scoliosis. Suspected hepatic steatosis. Aortic Atherosclerosis (ICD10-I70.0). Hernia mesh along the lower pelvis anteriorly. Multilevel lower lumbar impingement. Electronically Signed   By: Van Clines M.D.   On: 01/17/2018 16:49   Mr Jeri Cos VQ Contrast  Result Date: 01/14/2018 CLINICAL DATA:  Follow-up brain metastases. S RS. Lung cancer. Previous chemotherapy and radiation. EXAM: MRI HEAD WITHOUT AND WITH CONTRAST TECHNIQUE: Multiplanar, multiecho pulse sequences of the brain and surrounding structures were obtained without and with intravenous contrast. CONTRAST:  50mL MULTIHANCE GADOBENATE DIMEGLUMINE 529 MG/ML IV SOLN COMPARISON:  09/11/2017.  05/04/2017. FINDINGS: Brain: Diffusion imaging does not show any acute or subacute infarction or other cause of restricted diffusion. There is no evidence of new or recurrent brain metastasis. Previously treated inferior frontal metastasis on the left remains represented by a 3 mm nonenhancing cyst. The remainder the brain appears negative without ischemic changes. No hemorrhage, hydrocephalus or extra-axial collection. Vascular: Major vessels at the base of the brain show flow. Skull and upper cervical spine: Negative Sinuses/Orbits: Clear/normal Other: None IMPRESSION: No residual or recurrent disease. 3 mm nonenhancing  cyst at the inferior frontal lobe on the left is the only sequela of the previously treated disease. Electronically Signed   By: Nelson Chimes M.D.   On: 01/14/2018 19:30   Ct Abdomen Pelvis W Contrast  Result Date: 01/17/2018 CLINICAL DATA:  Metastatic non-small cell lung cancer with poorly differentiated high-grade neuroendocrine carcinoma, status post stereotactic radiotherapy of a solitary brain metastatic lesion and chemo radiation as  well as systemic chemotherapy. Patient currently on maintenance systemic chemotherapy. EXAM: CT CHEST, ABDOMEN, AND PELVIS WITH CONTRAST TECHNIQUE: Multidetector CT imaging of the chest, abdomen and pelvis was performed following the standard protocol during bolus administration of intravenous contrast. CONTRAST:  167mL OMNIPAQUE IOHEXOL 300 MG/ML  SOLN COMPARISON:  11/12/2017 FINDINGS: CT CHEST FINDINGS Cardiovascular: Unremarkable Mediastinum/Nodes: No pathologic thoracic adenopathy. Lungs/Pleura: Stable left perihilar ground-glass opacities attributable to prior radiation therapy. No solid progressive nodularity in this vicinity. Posterolaterally along the left costophrenic angle, an intrapulmonary nodule measures 1.1 by 0.9 cm on image 122/4, and by my measurements was previously 1.0 by 0.9 cm. On the prior PET-CT from 12/17/2016 this lesion measured 1.1 cm in long axis and was mildly hypermetabolic. No additional or new significant nodule is observed. Musculoskeletal: Levoconvex upper thoracic scoliosis. No compelling findings of osseous metastatic disease. CT ABDOMEN PELVIS FINDINGS Hepatobiliary: Suspected hepatic steatosis without focal lesion observed. Gallbladder unremarkable. Pancreas: Unremarkable. Spleen: Unremarkable.  Small accessory spleen noted. Adrenals/Urinary Tract: Normal adrenal glands. Exophytic 9 mm hypodense lesion from the right kidney lower pole, likely a cyst technically too small to characterize. The kidneys, ureters, and urinary bladder appear otherwise unremarkable. Stomach/Bowel: Unremarkable Vascular/Lymphatic: Mild aortoiliac atherosclerotic vascular disease. Left external iliac lymph node 9 mm in diameter, previously 10 mm. Right external iliac lymph node 9 mm in diameter, previously 10 mm. No overt pathologic adenopathy. Reproductive: Stable hazy stranding around the seminal vesicles, otherwise unremarkable. Other: No supplemental non-categorized findings. Musculoskeletal: Hernia  mesh along the lower pelvis anteriorly. Degenerative disc disease at L5-S1. Facet arthropathy in the lower lumbar spine especially at L3-4. Foraminal impingement on the right at L3-4, L4-5, and L5-S1; and on the left at L4-5 and L5-S1. There is 3 mm of degenerative grade 1 retrolisthesis at L4-5. IMPRESSION: 1. Essentially stable left lower lobe pulmonary nodule. This measures 1.1 cm in long axis, as it did on the prior PET-CT from 12/17/2016 where the nodule demonstrated low-grade accentuated metabolic activity. No significant progression or new nodule. 2. Left perihilar ground-glass opacity favoring radiation pneumonitis/radiation fibrosis. 3. Other imaging findings of potential clinical significance: Levoconvex upper thoracic scoliosis. Suspected hepatic steatosis. Aortic Atherosclerosis (ICD10-I70.0). Hernia mesh along the lower pelvis anteriorly. Multilevel lower lumbar impingement. Electronically Signed   By: Van Clines M.D.   On: 01/17/2018 16:49    ASSESSMENT AND PLAN: This is a very pleasant 52 years old white male with stage IV non-small cell lung cancer, high-grade neuroendocrine carcinoma presented with locally advanced disease in the chest as well as solitary brain metastasis and 2 liver lesions. The patient is status post stereotactic radiotherapy to the solitary brain metastasis as well as short course of concurrent chemoradiation to the locally advanced disease in the chest. He recently completed 6 cycles of systemic chemotherapy with carboplatin, Alimta and Avastin and tolerated this treatment fairly well. The patient is currently on maintenance treatment with Alimta and Avastin status post 9 cycles. He continues to tolerate this treatment well with no concerning complaints.  He had repeat CT scan of the chest, abdomen and pelvis that showed  no concerning findings for disease progression. I discussed the scan results with the patient and his wife.  I recommended for him to continue  his current treatment with maintenance Alimta and Avastin and he will proceed with cycle #10 today. For the lower extremity edema, the patient will continue his current treatment with Lasix on as-needed basis. For the delayed nausea, I will start the patient on Zofran 8 mg p.o. every 8 hours as needed. He will come back for follow-up visit in 3 weeks for evaluation before starting the next cycle of his treatment. The patient was advised to call if he has any concerning symptoms in the interval. The patient voices understanding of current disease status and treatment options and is in agreement with the current care plan. All questions were answered. The patient knows to call the clinic with any problems, questions or concerns. We can certainly see the patient much sooner if necessary.  Disclaimer: This note was dictated with voice recognition software. Similar sounding words can inadvertently be transcribed and may not be corrected upon review.

## 2018-01-21 ENCOUNTER — Other Ambulatory Visit: Payer: Self-pay | Admitting: Radiation Therapy

## 2018-01-21 DIAGNOSIS — C7931 Secondary malignant neoplasm of brain: Secondary | ICD-10-CM

## 2018-01-21 DIAGNOSIS — C7949 Secondary malignant neoplasm of other parts of nervous system: Principal | ICD-10-CM

## 2018-02-03 DIAGNOSIS — R609 Edema, unspecified: Secondary | ICD-10-CM | POA: Diagnosis not present

## 2018-02-03 DIAGNOSIS — I10 Essential (primary) hypertension: Secondary | ICD-10-CM | POA: Diagnosis not present

## 2018-02-03 DIAGNOSIS — C3490 Malignant neoplasm of unspecified part of unspecified bronchus or lung: Secondary | ICD-10-CM | POA: Diagnosis not present

## 2018-02-08 ENCOUNTER — Inpatient Hospital Stay: Payer: 59

## 2018-02-08 ENCOUNTER — Encounter: Payer: Self-pay | Admitting: Internal Medicine

## 2018-02-08 ENCOUNTER — Inpatient Hospital Stay (HOSPITAL_BASED_OUTPATIENT_CLINIC_OR_DEPARTMENT_OTHER): Payer: 59 | Admitting: Internal Medicine

## 2018-02-08 ENCOUNTER — Telehealth: Payer: Self-pay | Admitting: Internal Medicine

## 2018-02-08 VITALS — BP 161/107 | HR 113 | Temp 98.4°F | Resp 18 | Ht 73.0 in | Wt 243.3 lb

## 2018-02-08 VITALS — BP 151/91 | HR 96

## 2018-02-08 DIAGNOSIS — Z5111 Encounter for antineoplastic chemotherapy: Secondary | ICD-10-CM

## 2018-02-08 DIAGNOSIS — C7B8 Other secondary neuroendocrine tumors: Secondary | ICD-10-CM

## 2018-02-08 DIAGNOSIS — C7A1 Malignant poorly differentiated neuroendocrine tumors: Secondary | ICD-10-CM

## 2018-02-08 DIAGNOSIS — Z5112 Encounter for antineoplastic immunotherapy: Secondary | ICD-10-CM | POA: Diagnosis not present

## 2018-02-08 DIAGNOSIS — M7989 Other specified soft tissue disorders: Secondary | ICD-10-CM | POA: Diagnosis not present

## 2018-02-08 DIAGNOSIS — I10 Essential (primary) hypertension: Secondary | ICD-10-CM | POA: Diagnosis not present

## 2018-02-08 DIAGNOSIS — R110 Nausea: Secondary | ICD-10-CM

## 2018-02-08 DIAGNOSIS — C3492 Malignant neoplasm of unspecified part of left bronchus or lung: Secondary | ICD-10-CM

## 2018-02-08 DIAGNOSIS — Z923 Personal history of irradiation: Secondary | ICD-10-CM

## 2018-02-08 LAB — COMPREHENSIVE METABOLIC PANEL
ALT: 27 U/L (ref 0–55)
AST: 23 U/L (ref 5–34)
Albumin: 3.9 g/dL (ref 3.5–5.0)
Alkaline Phosphatase: 99 U/L (ref 40–150)
Anion gap: 10 (ref 3–11)
BUN: 11 mg/dL (ref 7–26)
CO2: 23 mmol/L (ref 22–29)
Calcium: 9.9 mg/dL (ref 8.4–10.4)
Chloride: 102 mmol/L (ref 98–109)
Creatinine, Ser: 1.07 mg/dL (ref 0.70–1.30)
GFR calc Af Amer: >60 mL/min (ref >60)
GFR calc non Af Amer: >60 mL/min (ref >60)
Glucose, Bld: 198 mg/dL — ABNORMAL HIGH (ref 70–140)
Potassium: 4.3 mmol/L (ref 3.5–5.1)
Sodium: 135 mmol/L — ABNORMAL LOW (ref 136–145)
Total Bilirubin: 0.6 mg/dL (ref 0.2–1.2)
Total Protein: 8 g/dL (ref 6.4–8.3)

## 2018-02-08 LAB — CBC WITH DIFFERENTIAL/PLATELET
Basophils Absolute: 0 10*3/uL (ref 0.0–0.1)
Basophils Relative: 0 %
Eosinophils Absolute: 0 10*3/uL (ref 0.0–0.5)
Eosinophils Relative: 0 %
HCT: 45.9 % (ref 38.4–49.9)
Hemoglobin: 15.7 g/dL (ref 13.0–17.1)
Lymphocytes Relative: 12 %
Lymphs Abs: 0.3 10*3/uL — ABNORMAL LOW (ref 0.9–3.3)
MCH: 39 pg — ABNORMAL HIGH (ref 27.2–33.4)
MCHC: 34.2 g/dL (ref 32.0–36.0)
MCV: 113.9 fL — ABNORMAL HIGH (ref 79.3–98.0)
Monocytes Absolute: 0.1 10*3/uL (ref 0.1–0.9)
Monocytes Relative: 3 %
Neutro Abs: 2.5 10*3/uL (ref 1.5–6.5)
Neutrophils Relative %: 85 %
Platelets: 248 10*3/uL (ref 140–400)
RBC: 4.03 MIL/uL — ABNORMAL LOW (ref 4.20–5.82)
RDW: 15.3 % — ABNORMAL HIGH (ref 11.0–14.6)
WBC: 2.9 10*3/uL — ABNORMAL LOW (ref 4.0–10.3)

## 2018-02-08 LAB — TOTAL PROTEIN, URINE DIPSTICK: Protein, ur: <30 mg/dL — AB

## 2018-02-08 MED ORDER — PROCHLORPERAZINE MALEATE 10 MG PO TABS
10.0000 mg | ORAL_TABLET | Freq: Once | ORAL | Status: AC
  Administered 2018-02-08: 10 mg via ORAL

## 2018-02-08 MED ORDER — PROCHLORPERAZINE MALEATE 10 MG PO TABS
ORAL_TABLET | ORAL | Status: AC
  Filled 2018-02-08: qty 1

## 2018-02-08 MED ORDER — SODIUM CHLORIDE 0.9 % IV SOLN
1200.0000 mg | Freq: Once | INTRAVENOUS | Status: AC
  Administered 2018-02-08: 1200 mg via INTRAVENOUS
  Filled 2018-02-08: qty 40

## 2018-02-08 MED ORDER — SODIUM CHLORIDE 0.9 % IV SOLN
15.2000 mg/kg | Freq: Once | INTRAVENOUS | Status: AC
  Administered 2018-02-08: 1600 mg via INTRAVENOUS
  Filled 2018-02-08: qty 64

## 2018-02-08 MED ORDER — SODIUM CHLORIDE 0.9 % IV SOLN
Freq: Once | INTRAVENOUS | Status: AC
  Administered 2018-02-08: 14:00:00 via INTRAVENOUS

## 2018-02-08 NOTE — Progress Notes (Signed)
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  PRIOR THERAPY:  1) Stereotactic radiotherapy to a solitary brain metastasis under the care of Dr. Lisbeth Renshaw on 01/25/2017. 2) Short course of concurrent chemoradiation with weekly carboplatin for AUC of 2 and paclitaxel 45 MG/M2 to the locally advanced disease in the chest. Status post 3 cycles. 3) Systemic chemotherapy with carboplatin for AUC of 5, Alimta 500 MG/M2 and Avastin 15 MG/KG every 3 weeks. First dose of 02/15/2017. Status post 6 cycles. Last dose 06/08/2017 with stable disease.   CURRENT THERAPY: Maintenance systemic chemotherapy with Alimta 500 MG/M2 in addition to Avastin 15 MG/KG every 3 weeks. First dose 07/13/2017.  Status post 10 cycles.  INTERVAL HISTORY: Calvin Wagner 52 y.o. male returns to the clinic today for follow-up visit accompanied by his wife.  The patient is feeling fine with no specific complaints except for the usual swelling of the lower extremities.  He was a started on Lasix and felt very well but has not used it for the last few days.  He denied having any chest pain but has shortness of breath with mild cough and no hemoptysis.  He denied having any recent weight loss or night sweats.  His blood pressure is elevated today but it is usually elevated at the cancer center and normal at home.  He has no fever or chills.  He is here today for evaluation before starting cycle #11 of his treatment.  MEDICAL HISTORY: Past Medical History:  Diagnosis Date  . Encounter for antineoplastic  chemotherapy 12/31/2016  . GERD 11/08/2009   Qualifier: Diagnosis of  By: Ronnald Ramp MD, Arvid Right Goals of care, counseling/discussion 12/31/2016  . History of radiation therapy 01/13/2017 - 02/02/2017   Site/dose:  Left lung: 30 Gy in 15 fractions  . HYPERTENSION 11/08/2009   Qualifier: Diagnosis of  By: Ronnald Ramp MD, Arvid Right.   . Large cell carcinoma of left lung, stage 4 ([facility]) 12/31/2016  . Migraine 02/25/2012  . Pneumonia   . PONV (postoperative nausea and vomiting)   . Rotator cuff tear, left     ALLERGIES:  is allergic to no known allergies.  MEDICATIONS:  Current Outpatient Medications  Medication Sig Dispense Refill  . amLODipine (NORVASC) 5 MG tablet Take 5 mg by mouth daily.    . budesonide-formoterol (SYMBICORT) 80-4.5 MCG/ACT inhaler Inhale 2 puffs into the lungs 2 (two) times daily. 1 Inhaler 5  . chlorpheniramine-HYDROcodone (TUSSIONEX) 10-8 MG/5ML SUER Take 5 mLs by mouth every 12 (twelve) hours as needed for cough. 473 mL 0  . dexamethasone (DECADRON) 4 MG tablet 4 mg by mouth twice a day the day before, day of and day after the chemotherapy 40 tablet 1  . folic acid (FOLVITE) 1 MG tablet Take 1 tablet (1 mg total) by mouth daily. 30 tablet 4  . furosemide (LASIX) 20 MG tablet Take 1 tablet (20 mg total) by mouth daily as needed. 20 tablet 0  . LORazepam (ATIVAN) 0.5 MG tablet 1 tablet po q 4-6 hours prn anxiety and one po 30 minutes prior to radiation or  MRI 30 tablet 0  . losartan (COZAAR) 50 MG tablet Take 50 mg by mouth daily.    . Multiple Vitamin (MULTIVITAMIN WITH MINERALS) TABS tablet Take 1 tablet by mouth daily.    . ondansetron (ZOFRAN) 8 MG tablet Take 1 tablet (8 mg total) by mouth every 8 (eight) hours as needed for nausea or vomiting. 20 tablet 0  . pantoprazole (PROTONIX) 40 MG tablet Take 40 mg by mouth daily.  2  . prochlorperazine (COMPAZINE) 10 MG tablet TAKE 1 TABLET BY MOUTH  EVERY 6 HOURS AS NEEDED FOR NAUSEA OR VOMITING 30 tablet 0  . prochlorperazine  (COMPAZINE) 10 MG tablet TAKE 1 TABLET BY MOUTH  EVERY 6 HOURS AS NEEDED FOR NAUSEA OR VOMITING 30 tablet 1  . rivaroxaban (XARELTO) 20 MG TABS tablet Take 1 tablet (20 mg total) by mouth daily with supper. 30 tablet 1  . traMADol (ULTRAM) 50 MG tablet TAKE 1 TABLET BY MOUTH EVERY 12 HOURS AS NEEDED 30 tablet 0   No current facility-administered medications for this visit.     SURGICAL HISTORY:  Past Surgical History:  Procedure Laterality Date  . INGUINAL HERNIA REPAIR    . SHOULDER ARTHROSCOPY WITH ROTATOR CUFF REPAIR Left 11/12/2016   Procedure: SHOULDER ARTHROSCOPY WITH ROTATOR CUFF REPAIR AND SUBACROMIAL DECOMPRESSION;  Surgeon: Tania Ade, MD;  Location: [facility];  Service: Orthopedics;  Laterality: Left;  SHOULDER ARTHROSCOPY WITH ROTATOR CUFF REPAIR AND SUBACROMIAL DECOMPRESSION  . TONSILLECTOMY    . TOTAL KNEE ARTHROPLASTY    . VIDEO BRONCHOSCOPY Bilateral 12/10/2016   Procedure: VIDEO BRONCHOSCOPY WITHOUT FLUORO;  Surgeon: Tanda Rockers, MD;  Location: [facility];  Service: Cardiopulmonary;  Laterality: Bilateral;    REVIEW OF SYSTEMS:  A comprehensive review of systems was negative except for: Constitutional: positive for fatigue Respiratory: positive for dyspnea on exertion Swelling of the lower extremities   PHYSICAL EXAMINATION: General appearance: alert, cooperative, fatigued and no distress Head: Normocephalic, without obvious abnormality, atraumatic Neck: no adenopathy, no JVD, supple, symmetrical, trachea midline and thyroid not enlarged, symmetric, no tenderness/mass/nodules Lymph nodes: Cervical, supraclavicular, and axillary nodes normal. Resp: clear to auscultation bilaterally Back: symmetric, no curvature. ROM normal. No CVA tenderness. Cardio: regular rate and rhythm, S1, S2 normal, no murmur, click, rub or gallop GI: soft, non-tender; bowel sounds normal; no masses,  no organomegaly Extremities: edema 1+  ECOG PERFORMANCE STATUS: 1 - Symptomatic but  completely ambulatory  Blood pressure (!) 161/107, pulse (!) 113, temperature 98.4 F (36.9 C), temperature source Oral, resp. rate 18, height 6\' 1"  (1.854 m), weight 243 lb 4.8 oz (110.4 kg), SpO2 100 %.  LABORATORY DATA: Lab Results  Component Value Date   WBC 2.9 (L) 02/08/2018   HGB 15.7 02/08/2018   HCT 45.9 02/08/2018   MCV 113.9 (H) 02/08/2018   PLT 248 02/08/2018      Chemistry      Component Value Date/Time   NA 137 01/18/2018 1110   NA 138 10/08/2017 0907   K 3.9 01/18/2018 1110   K 4.1 10/08/2017 0907   CL 104 01/18/2018 1110   CO2 24 01/18/2018 1110   CO2 24 10/08/2017 0907   BUN 10 01/18/2018 1110   BUN 11.7 10/08/2017 0907   CREATININE 1.15 01/18/2018 1110   CREATININE 1.2 10/08/2017 0907      Component Value Date/Time   CALCIUM 9.4 01/18/2018 1110   CALCIUM 9.5 10/08/2017 0907   ALKPHOS 87 01/18/2018 1110   ALKPHOS 88 10/08/2017 0907  AST 24 01/18/2018 1110   AST 22 10/08/2017 0907   ALT 32 01/18/2018 1110   ALT 24 10/08/2017 0907   BILITOT 0.4 01/18/2018 1110   BILITOT 0.42 10/08/2017 3557       RADIOGRAPHIC STUDIES: Ct Chest W Contrast  Result Date: 01/17/2018 CLINICAL DATA:  Metastatic non-small cell lung cancer with poorly differentiated high-grade neuroendocrine carcinoma, status post stereotactic radiotherapy of a solitary brain metastatic lesion and chemo radiation as well as systemic chemotherapy. Patient currently on maintenance systemic chemotherapy. EXAM: CT CHEST, ABDOMEN, AND PELVIS WITH CONTRAST TECHNIQUE: Multidetector CT imaging of the chest, abdomen and pelvis was performed following the standard protocol during bolus administration of intravenous contrast. CONTRAST:  128mL OMNIPAQUE IOHEXOL 300 MG/ML  SOLN COMPARISON:  11/12/2017 FINDINGS: CT CHEST FINDINGS Cardiovascular: Unremarkable Mediastinum/Nodes: No pathologic thoracic adenopathy. Lungs/Pleura: Stable left perihilar ground-glass opacities attributable to prior radiation therapy.  No solid progressive nodularity in this vicinity. Posterolaterally along the left costophrenic angle, an intrapulmonary nodule measures 1.1 by 0.9 cm on image 122/4, and by my measurements was previously 1.0 by 0.9 cm. On the prior PET-CT from 12/17/2016 this lesion measured 1.1 cm in long axis and was mildly hypermetabolic. No additional or new significant nodule is observed. Musculoskeletal: Levoconvex upper thoracic scoliosis. No compelling findings of osseous metastatic disease. CT ABDOMEN PELVIS FINDINGS Hepatobiliary: Suspected hepatic steatosis without focal lesion observed. Gallbladder unremarkable. Pancreas: Unremarkable. Spleen: Unremarkable.  Small accessory spleen noted. Adrenals/Urinary Tract: Normal adrenal glands. Exophytic 9 mm hypodense lesion from the right kidney lower pole, likely a cyst technically too small to characterize. The kidneys, ureters, and urinary bladder appear otherwise unremarkable. Stomach/Bowel: Unremarkable Vascular/Lymphatic: Mild aortoiliac atherosclerotic vascular disease. Left external iliac lymph node 9 mm in diameter, previously 10 mm. Right external iliac lymph node 9 mm in diameter, previously 10 mm. No overt pathologic adenopathy. Reproductive: Stable hazy stranding around the seminal vesicles, otherwise unremarkable. Other: No supplemental non-categorized findings. Musculoskeletal: Hernia mesh along the lower pelvis anteriorly. Degenerative disc disease at L5-S1. Facet arthropathy in the lower lumbar spine especially at L3-4. Foraminal impingement on the right at L3-4, L4-5, and L5-S1; and on the left at L4-5 and L5-S1. There is 3 mm of degenerative grade 1 retrolisthesis at L4-5. IMPRESSION: 1. Essentially stable left lower lobe pulmonary nodule. This measures 1.1 cm in long axis, as it did on the prior PET-CT from 12/17/2016 where the nodule demonstrated low-grade accentuated metabolic activity. No significant progression or new nodule. 2. Left perihilar ground-glass  opacity favoring radiation pneumonitis/radiation fibrosis. 3. Other imaging findings of potential clinical significance: Levoconvex upper thoracic scoliosis. Suspected hepatic steatosis. Aortic Atherosclerosis (ICD10-I70.0). Hernia mesh along the lower pelvis anteriorly. Multilevel lower lumbar impingement. Electronically Signed   By: Van Clines M.D.   On: 01/17/2018 16:49   Mr Jeri Cos DU Contrast  Result Date: 01/14/2018 CLINICAL DATA:  Follow-up brain metastases. S RS. Lung cancer. Previous chemotherapy and radiation. EXAM: MRI HEAD WITHOUT AND WITH CONTRAST TECHNIQUE: Multiplanar, multiecho pulse sequences of the brain and surrounding structures were obtained without and with intravenous contrast. CONTRAST:  18mL MULTIHANCE GADOBENATE DIMEGLUMINE 529 MG/ML IV SOLN COMPARISON:  09/11/2017.  05/04/2017. FINDINGS: Brain: Diffusion imaging does not show any acute or subacute infarction or other cause of restricted diffusion. There is no evidence of new or recurrent brain metastasis. Previously treated inferior frontal metastasis on the left remains represented by a 3 mm nonenhancing cyst. The remainder the brain appears negative without ischemic changes. No hemorrhage, hydrocephalus or extra-axial collection.  Vascular: Major vessels at the base of the brain show flow. Skull and upper cervical spine: Negative Sinuses/Orbits: Clear/normal Other: None IMPRESSION: No residual or recurrent disease. 3 mm nonenhancing cyst at the inferior frontal lobe on the left is the only sequela of the previously treated disease. Electronically Signed   By: Nelson Chimes M.D.   On: 01/14/2018 19:30   Ct Abdomen Pelvis W Contrast  Result Date: 01/17/2018 CLINICAL DATA:  Metastatic non-small cell lung cancer with poorly differentiated high-grade neuroendocrine carcinoma, status post stereotactic radiotherapy of a solitary brain metastatic lesion and chemo radiation as well as systemic chemotherapy. Patient currently on  maintenance systemic chemotherapy. EXAM: CT CHEST, ABDOMEN, AND PELVIS WITH CONTRAST TECHNIQUE: Multidetector CT imaging of the chest, abdomen and pelvis was performed following the standard protocol during bolus administration of intravenous contrast. CONTRAST:  138mL OMNIPAQUE IOHEXOL 300 MG/ML  SOLN COMPARISON:  11/12/2017 FINDINGS: CT CHEST FINDINGS Cardiovascular: Unremarkable Mediastinum/Nodes: No pathologic thoracic adenopathy. Lungs/Pleura: Stable left perihilar ground-glass opacities attributable to prior radiation therapy. No solid progressive nodularity in this vicinity. Posterolaterally along the left costophrenic angle, an intrapulmonary nodule measures 1.1 by 0.9 cm on image 122/4, and by my measurements was previously 1.0 by 0.9 cm. On the prior PET-CT from 12/17/2016 this lesion measured 1.1 cm in long axis and was mildly hypermetabolic. No additional or new significant nodule is observed. Musculoskeletal: Levoconvex upper thoracic scoliosis. No compelling findings of osseous metastatic disease. CT ABDOMEN PELVIS FINDINGS Hepatobiliary: Suspected hepatic steatosis without focal lesion observed. Gallbladder unremarkable. Pancreas: Unremarkable. Spleen: Unremarkable.  Small accessory spleen noted. Adrenals/Urinary Tract: Normal adrenal glands. Exophytic 9 mm hypodense lesion from the right kidney lower pole, likely a cyst technically too small to characterize. The kidneys, ureters, and urinary bladder appear otherwise unremarkable. Stomach/Bowel: Unremarkable Vascular/Lymphatic: Mild aortoiliac atherosclerotic vascular disease. Left external iliac lymph node 9 mm in diameter, previously 10 mm. Right external iliac lymph node 9 mm in diameter, previously 10 mm. No overt pathologic adenopathy. Reproductive: Stable hazy stranding around the seminal vesicles, otherwise unremarkable. Other: No supplemental non-categorized findings. Musculoskeletal: Hernia mesh along the lower pelvis anteriorly. Degenerative  disc disease at L5-S1. Facet arthropathy in the lower lumbar spine especially at L3-4. Foraminal impingement on the right at L3-4, L4-5, and L5-S1; and on the left at L4-5 and L5-S1. There is 3 mm of degenerative grade 1 retrolisthesis at L4-5. IMPRESSION: 1. Essentially stable left lower lobe pulmonary nodule. This measures 1.1 cm in long axis, as it did on the prior PET-CT from 12/17/2016 where the nodule demonstrated low-grade accentuated metabolic activity. No significant progression or new nodule. 2. Left perihilar ground-glass opacity favoring radiation pneumonitis/radiation fibrosis. 3. Other imaging findings of potential clinical significance: Levoconvex upper thoracic scoliosis. Suspected hepatic steatosis. Aortic Atherosclerosis (ICD10-I70.0). Hernia mesh along the lower pelvis anteriorly. Multilevel lower lumbar impingement. Electronically Signed   By: Van Clines M.D.   On: 01/17/2018 16:49    ASSESSMENT AND PLAN: This is a very pleasant 52 years old white male with stage IV non-small cell lung cancer, high-grade neuroendocrine carcinoma presented with locally advanced disease in the chest as well as solitary brain metastasis and 2 liver lesions. The patient is status post stereotactic radiotherapy to the solitary brain metastasis as well as short course of concurrent chemoradiation to the locally advanced disease in the chest. He recently completed 6 cycles of systemic chemotherapy with carboplatin, Alimta and Avastin and tolerated this treatment fairly well. The patient is currently on maintenance treatment with Alimta and  Avastin status post 10 cycles. He continues to tolerate the treatment well except for fatigue.  I recommended for him to proceed with cycle #11 today as a scheduled. For the lower extremity edema, the patient will continue his current treatment with Lasix on as-needed basis. For the delayed nausea, I will start the patient on Zofran 8 mg p.o. every 8 hours as  needed. The patient will come back for follow-up visit in 3 weeks for evaluation before starting cycle #12. He was advised to call if he has any concerning symptoms in the interval. The patient voices understanding of current disease status and treatment options and is in agreement with the current care plan. All questions were answered. The patient knows to call the clinic with any problems, questions or concerns. We can certainly see the patient much sooner if necessary.  Disclaimer: This note was dictated with voice recognition software. Similar sounding words can inadvertently be transcribed and may not be corrected upon review.

## 2018-02-08 NOTE — Patient Instructions (Signed)
[facility] Discharge Instructions for Patients Receiving Chemotherapy  Today you received the following chemotherapy agents:  Alimta, Avastin  To help prevent nausea and vomiting after your treatment, we encourage you to take your nausea medication as prescribed.   If you develop nausea and vomiting that is not controlled by your nausea medication, call the clinic.   BELOW ARE SYMPTOMS THAT SHOULD BE REPORTED IMMEDIATELY:  *FEVER GREATER THAN 100.5 F  *CHILLS WITH OR WITHOUT FEVER  NAUSEA AND VOMITING THAT IS NOT CONTROLLED WITH YOUR NAUSEA MEDICATION  *UNUSUAL SHORTNESS OF BREATH  *UNUSUAL BRUISING OR BLEEDING  TENDERNESS IN MOUTH AND THROAT WITH OR WITHOUT PRESENCE OF ULCERS  *URINARY PROBLEMS  *BOWEL PROBLEMS  UNUSUAL RASH Items with * indicate a potential emergency and should be followed up as soon as possible.  Feel free to call the clinic should you have any questions or concerns. The clinic phone number is [phone].  Please show the [facility] at check-in to the Emergency Department and triage nurse.

## 2018-02-08 NOTE — Telephone Encounter (Signed)
Scheduled appt per 4/30 los - pt to get an updated schedule in the treatment area,.

## 2018-02-14 ENCOUNTER — Other Ambulatory Visit: Payer: Self-pay | Admitting: Internal Medicine

## 2018-02-14 DIAGNOSIS — I2782 Chronic pulmonary embolism: Principal | ICD-10-CM

## 2018-02-14 DIAGNOSIS — I2609 Other pulmonary embolism with acute cor pulmonale: Secondary | ICD-10-CM

## 2018-02-17 NOTE — Progress Notes (Signed)
Prior auth for Xarelto has been submitted. Status is pending.

## 2018-03-01 ENCOUNTER — Inpatient Hospital Stay: Payer: 59 | Attending: Internal Medicine

## 2018-03-01 ENCOUNTER — Inpatient Hospital Stay (HOSPITAL_BASED_OUTPATIENT_CLINIC_OR_DEPARTMENT_OTHER): Payer: 59 | Admitting: Oncology

## 2018-03-01 ENCOUNTER — Telehealth: Payer: Self-pay | Admitting: Oncology

## 2018-03-01 ENCOUNTER — Other Ambulatory Visit: Payer: Self-pay

## 2018-03-01 ENCOUNTER — Encounter: Payer: Self-pay | Admitting: Oncology

## 2018-03-01 ENCOUNTER — Inpatient Hospital Stay: Payer: 59

## 2018-03-01 ENCOUNTER — Telehealth: Payer: Self-pay | Admitting: *Deleted

## 2018-03-01 VITALS — BP 143/91 | HR 91 | Resp 18

## 2018-03-01 VITALS — BP 143/101 | HR 105 | Temp 98.3°F | Resp 18 | Ht 73.0 in | Wt 245.2 lb

## 2018-03-01 DIAGNOSIS — Z5112 Encounter for antineoplastic immunotherapy: Secondary | ICD-10-CM | POA: Diagnosis not present

## 2018-03-01 DIAGNOSIS — R5383 Other fatigue: Secondary | ICD-10-CM | POA: Diagnosis not present

## 2018-03-01 DIAGNOSIS — Z5111 Encounter for antineoplastic chemotherapy: Secondary | ICD-10-CM | POA: Diagnosis not present

## 2018-03-01 DIAGNOSIS — R600 Localized edema: Secondary | ICD-10-CM

## 2018-03-01 DIAGNOSIS — C3492 Malignant neoplasm of unspecified part of left bronchus or lung: Secondary | ICD-10-CM

## 2018-03-01 DIAGNOSIS — C7B8 Other secondary neuroendocrine tumors: Secondary | ICD-10-CM | POA: Diagnosis not present

## 2018-03-01 DIAGNOSIS — C7A1 Malignant poorly differentiated neuroendocrine tumors: Secondary | ICD-10-CM

## 2018-03-01 DIAGNOSIS — R110 Nausea: Secondary | ICD-10-CM | POA: Insufficient documentation

## 2018-03-01 DIAGNOSIS — Z79899 Other long term (current) drug therapy: Secondary | ICD-10-CM | POA: Insufficient documentation

## 2018-03-01 DIAGNOSIS — I10 Essential (primary) hypertension: Secondary | ICD-10-CM | POA: Insufficient documentation

## 2018-03-01 LAB — COMPREHENSIVE METABOLIC PANEL
ALT: 30 U/L (ref 0–55)
AST: 27 U/L (ref 5–34)
Albumin: 3.5 g/dL (ref 3.5–5.0)
Alkaline Phosphatase: 82 U/L (ref 40–150)
Anion gap: 9 (ref 3–11)
BUN: 11 mg/dL (ref 7–26)
CO2: 23 mmol/L (ref 22–29)
Calcium: 9.1 mg/dL (ref 8.4–10.4)
Chloride: 104 mmol/L (ref 98–109)
Creatinine, Ser: 1.22 mg/dL (ref 0.70–1.30)
GFR calc Af Amer: >60 mL/min (ref >60)
GFR calc non Af Amer: >60 mL/min (ref >60)
Glucose, Bld: 175 mg/dL — ABNORMAL HIGH (ref 70–140)
Potassium: 4 mmol/L (ref 3.5–5.1)
Sodium: 136 mmol/L (ref 136–145)
Total Bilirubin: 0.5 mg/dL (ref 0.2–1.2)
Total Protein: 7.3 g/dL (ref 6.4–8.3)

## 2018-03-01 LAB — CBC WITH DIFFERENTIAL/PLATELET
Basophils Absolute: 0 10*3/uL (ref 0.0–0.1)
Basophils Relative: 1 %
Eosinophils Absolute: 0.2 10*3/uL (ref 0.0–0.5)
Eosinophils Relative: 4 %
HCT: 43.8 % (ref 38.4–49.9)
Hemoglobin: 14.5 g/dL (ref 13.0–17.1)
Lymphocytes Relative: 17 %
Lymphs Abs: 0.6 10*3/uL — ABNORMAL LOW (ref 0.9–3.3)
MCH: 38.3 pg — ABNORMAL HIGH (ref 27.2–33.4)
MCHC: 33.1 g/dL (ref 32.0–36.0)
MCV: 115.6 fL — ABNORMAL HIGH (ref 79.3–98.0)
Monocytes Absolute: 0.5 10*3/uL (ref 0.1–0.9)
Monocytes Relative: 13 %
Neutro Abs: 2.5 10*3/uL (ref 1.5–6.5)
Neutrophils Relative %: 65 %
Platelets: 200 10*3/uL (ref 140–400)
RBC: 3.79 MIL/uL — ABNORMAL LOW (ref 4.20–5.82)
RDW: 15 % — ABNORMAL HIGH (ref 11.0–14.6)
WBC: 3.8 10*3/uL — ABNORMAL LOW (ref 4.0–10.3)

## 2018-03-01 LAB — TOTAL PROTEIN, URINE DIPSTICK: Protein, ur: NEGATIVE mg/dL

## 2018-03-01 MED ORDER — SODIUM CHLORIDE 0.9 % IV SOLN
15.2000 mg/kg | Freq: Once | INTRAVENOUS | Status: AC
  Administered 2018-03-01: 1600 mg via INTRAVENOUS
  Filled 2018-03-01: qty 64

## 2018-03-01 MED ORDER — CYANOCOBALAMIN 1000 MCG/ML IJ SOLN
INTRAMUSCULAR | Status: AC
  Filled 2018-03-01: qty 1

## 2018-03-01 MED ORDER — PROCHLORPERAZINE MALEATE 10 MG PO TABS
10.0000 mg | ORAL_TABLET | Freq: Once | ORAL | Status: AC
  Administered 2018-03-01: 10 mg via ORAL

## 2018-03-01 MED ORDER — SODIUM CHLORIDE 0.9 % IV SOLN
Freq: Once | INTRAVENOUS | Status: AC
  Administered 2018-03-01: 12:00:00 via INTRAVENOUS

## 2018-03-01 MED ORDER — SODIUM CHLORIDE 0.9 % IV SOLN
510.0000 mg/m2 | Freq: Once | INTRAVENOUS | Status: AC
  Administered 2018-03-01: 1200 mg via INTRAVENOUS
  Filled 2018-03-01: qty 40

## 2018-03-01 MED ORDER — CYANOCOBALAMIN 1000 MCG/ML IJ SOLN
1000.0000 ug | Freq: Once | INTRAMUSCULAR | Status: AC
  Administered 2018-03-01: 1000 ug via INTRAMUSCULAR

## 2018-03-01 MED ORDER — PROCHLORPERAZINE MALEATE 10 MG PO TABS
ORAL_TABLET | ORAL | Status: AC
  Filled 2018-03-01: qty 1

## 2018-03-01 NOTE — Progress Notes (Signed)
Erasmo Downer NP aware of BP and baseline BP and will treat today

## 2018-03-01 NOTE — Telephone Encounter (Signed)
3 cycles already scheduled per 5/21 los.

## 2018-03-01 NOTE — Assessment & Plan Note (Addendum)
This is a very pleasant 52 years old white male with stage IV non-small cell lung cancer, high-grade neuroendocrine carcinoma presented with locally advanced disease in the chest as well as solitary brain metastasis and 2 liver lesions. The patient is status post stereotactic radiotherapy to the solitary brain metastasis as well as short course of concurrent chemoradiation to the locally advanced disease in the chest. He recently completed 6 cycles of systemic chemotherapy with carboplatin, Alimta and Avastin and tolerated this treatment fairly well. The patient is currently on maintenance treatment with Alimta and Avastin status post 11 cycles. He continues to tolerate the treatment well except for fatigue.  I recommended for him to proceed with cycle #12 today as a scheduled. For the lower extremity edema, the patient will continue his current treatment with Lasix on as-needed basis.  The patient plans to get compression stockings to help with his lower extremity edema. For the delayed nausea, the patient has a prescription for Zofran 8 mg p.o. every 8 hours as needed. The patient will have a restaging CT scan of the chest, abdomen, pelvis prior to his next visit. The patient will come back for follow-up visit in 3 weeks for evaluation before starting cycle #13.  He was advised to call if he has any concerning symptoms in the interval. The patient voices understanding of current disease status and treatment options and is in agreement with the current care plan. All questions were answered. The patient knows to call the clinic with any problems, questions or concerns. We can certainly see the patient much sooner if necessary.

## 2018-03-01 NOTE — Patient Instructions (Signed)
[facility] Discharge Instructions for Patients Receiving Chemotherapy  Today you received the following chemotherapy agents:  Alimta, Avastin  To help prevent nausea and vomiting after your treatment, we encourage you to take your nausea medication as prescribed.   If you develop nausea and vomiting that is not controlled by your nausea medication, call the clinic.   BELOW ARE SYMPTOMS THAT SHOULD BE REPORTED IMMEDIATELY:  *FEVER GREATER THAN 100.5 F  *CHILLS WITH OR WITHOUT FEVER  NAUSEA AND VOMITING THAT IS NOT CONTROLLED WITH YOUR NAUSEA MEDICATION  *UNUSUAL SHORTNESS OF BREATH  *UNUSUAL BRUISING OR BLEEDING  TENDERNESS IN MOUTH AND THROAT WITH OR WITHOUT PRESENCE OF ULCERS  *URINARY PROBLEMS  *BOWEL PROBLEMS  UNUSUAL RASH Items with * indicate a potential emergency and should be followed up as soon as possible.  Feel free to call the clinic should you have any questions or concerns. The clinic phone number is [phone].  Please show the [facility] at check-in to the Emergency Department and triage nurse.

## 2018-03-01 NOTE — Progress Notes (Signed)
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  PRIOR THERAPY: 1) Stereotactic radiotherapy to a solitary brain metastasis under the care of Dr. Lisbeth Renshaw on 01/25/2017. 2) Short course of concurrent chemoradiation with weekly carboplatin for AUC of 2 and paclitaxel 45 MG/M2 to the locally advanced disease in the chest. Status post 3 cycles. 3) Systemic chemotherapy with carboplatin for AUC of 5, Alimta 500 MG/M2 and Avastin 15 MG/KG every 3 weeks. First dose of 02/15/2017. Status post 6 cycles. Last dose 06/08/2017 with stable disease.  CURRENT THERAPY: Maintenance systemic chemotherapy with Alimta 500 MG/M2 in addition to Avastin 15 MG/KG every 3 weeks. First dose 07/13/2017.  Status post 11 cycles.  INTERVAL HISTORY: Calvin Wagner 52 y.o. male returns for a routine follow-up visit accompanied by his wife.  The patient is feeling fine today has no specific complaints except for lower extremity edema.  He reports that his lower extreme edema worsens a few days following each cycle of treatment.  It has now improved.  The patient denies fevers and chills.  Denies chest pain, shortness of breath, cough, hemoptysis.  Denies nausea, vomiting, constipation, diarrhea.  The patient is here for evaluation prior to cycle #12 his treatment.  MEDICAL HISTORY: Past Medical History:  Diagnosis Date  . Encounter for antineoplastic chemotherapy 12/31/2016  . GERD 11/08/2009   Qualifier: Diagnosis of  By: Ronnald Ramp MD, Arvid Right Goals of care, counseling/discussion 12/31/2016  . History of radiation therapy 01/13/2017 - 02/02/2017   Site/dose:  Left lung: 30 Gy in 15 fractions  . HYPERTENSION 11/08/2009   Qualifier: Diagnosis of  By: Ronnald Ramp MD, Arvid Right.   . Large cell carcinoma of left lung, stage 4 ([facility]) 12/31/2016  . Migraine 02/25/2012  . Pneumonia   . PONV (postoperative nausea and vomiting)   . Rotator cuff tear, left     ALLERGIES:  is allergic to no known allergies.  MEDICATIONS:  Current Outpatient Medications  Medication Sig Dispense Refill  . amLODipine (NORVASC) 5 MG tablet Take 5 mg by mouth daily.    . budesonide-formoterol (SYMBICORT) 80-4.5 MCG/ACT inhaler Inhale 2 puffs into the lungs 2 (two) times daily. 1 Inhaler 5  . dexamethasone (DECADRON) 4 MG tablet 4 mg by mouth twice a day the day before, day of and day after the chemotherapy 40 tablet 1  . folic acid (FOLVITE) 1 MG tablet Take 1 tablet (1 mg total) by mouth daily. 30 tablet 4  . furosemide (LASIX) 20 MG tablet Take 1 tablet (20 mg total) by mouth daily as needed. 20 tablet 0  . LORazepam (ATIVAN) 0.5 MG tablet 1 tablet po q 4-6 hours prn anxiety and one po 30 minutes prior to radiation or MRI 30 tablet 0  . losartan (COZAAR) 50 MG tablet Take 50 mg by mouth daily.    . Multiple Vitamin (MULTIVITAMIN WITH MINERALS) TABS tablet Take 1 tablet by mouth daily.    . ondansetron (ZOFRAN) 8 MG tablet Take 1 tablet (8 mg total) by mouth every 8 (eight) hours as needed for nausea or vomiting. 20 tablet 0  . pantoprazole (PROTONIX) 40 MG tablet Take 40 mg by mouth  daily.  2  . prochlorperazine (COMPAZINE) 10 MG tablet TAKE 1 TABLET BY MOUTH  EVERY 6 HOURS AS NEEDED FOR NAUSEA OR VOMITING 30 tablet 0  . prochlorperazine (COMPAZINE) 10 MG tablet TAKE 1 TABLET BY MOUTH  EVERY 6 HOURS AS NEEDED FOR NAUSEA OR VOMITING 30 tablet 1  . traMADol (ULTRAM) 50 MG tablet TAKE 1 TABLET BY MOUTH EVERY 12 HOURS AS NEEDED 30 tablet 0  . XARELTO 20 MG TABS tablet TAKE 1 TABLET BY MOUTH EVERY DAY WITH SUPPER 30 tablet 1  . chlorpheniramine-HYDROcodone (TUSSIONEX) 10-8  MG/5ML SUER Take 5 mLs by mouth every 12 (twelve) hours as needed for cough. (Patient not taking: Reported on 03/01/2018) 473 mL 0   No current facility-administered medications for this visit.     SURGICAL HISTORY:  Past Surgical History:  Procedure Laterality Date  . INGUINAL HERNIA REPAIR    . SHOULDER ARTHROSCOPY WITH ROTATOR CUFF REPAIR Left 11/12/2016   Procedure: SHOULDER ARTHROSCOPY WITH ROTATOR CUFF REPAIR AND SUBACROMIAL DECOMPRESSION;  Surgeon: Tania Ade, MD;  Location: [facility];  Service: Orthopedics;  Laterality: Left;  SHOULDER ARTHROSCOPY WITH ROTATOR CUFF REPAIR AND SUBACROMIAL DECOMPRESSION  . TONSILLECTOMY    . TOTAL KNEE ARTHROPLASTY    . VIDEO BRONCHOSCOPY Bilateral 12/10/2016   Procedure: VIDEO BRONCHOSCOPY WITHOUT FLUORO;  Surgeon: Tanda Rockers, MD;  Location: [facility];  Service: Cardiopulmonary;  Laterality: Bilateral;    REVIEW OF SYSTEMS:   Review of Systems  Constitutional: Negative for appetite change, chills, fever and unexpected weight change. Positive for fatigue. HENT:   Negative for mouth sores, nosebleeds, sore throat and trouble swallowing.   Eyes: Negative for eye problems and icterus.  Respiratory: Negative for cough, hemoptysis, shortness of breath and wheezing.   Cardiovascular: Negative for chest pain.  Positive for lower extremity edema.  Gastrointestinal: Negative for abdominal pain, constipation, diarrhea, nausea and vomiting.  Genitourinary: Negative for bladder incontinence, difficulty urinating, dysuria, frequency and hematuria.   Musculoskeletal: Negative for back pain, gait problem, neck pain and neck stiffness.  Skin: Negative for itching and rash.  Neurological: Negative for dizziness, extremity weakness, gait problem, headaches, light-headedness and seizures.  Hematological: Negative for adenopathy. Does not bruise/bleed easily.  Psychiatric/Behavioral: Negative for confusion, depression and sleep disturbance. The patient is not  nervous/anxious.     PHYSICAL EXAMINATION:  Blood pressure (!) 143/101, pulse (!) 105, temperature 98.3 F (36.8 C), temperature source Oral, resp. rate 18, height 6\' 1"  (1.854 m), weight 245 lb 3.2 oz (111.2 kg), SpO2 100 %.  ECOG PERFORMANCE STATUS: 1 - Symptomatic but completely ambulatory  Physical Exam  Constitutional: Oriented to person, place, and time and well-developed, well-nourished, and in no distress. No distress.  HENT:  Head: Normocephalic and atraumatic.  Mouth/Throat: Oropharynx is clear and moist. No oropharyngeal exudate.  Eyes: Conjunctivae are normal. Right eye exhibits no discharge. Left eye exhibits no discharge. No scleral icterus.  Neck: Normal range of motion. Neck supple.  Cardiovascular: Normal rate, regular rhythm, normal heart sounds and intact distal pulses.  Trace pedal edema bilaterally.  Pulmonary/Chest: Effort normal and breath sounds normal. No respiratory distress. No wheezes. No rales.  Abdominal: Soft. Bowel sounds are normal. Exhibits no distension and no mass. There is no tenderness.  Musculoskeletal: Normal range of motion. Exhibits no edema.  Lymphadenopathy:    No cervical adenopathy.  Neurological: Alert and oriented to person, place, and time. Exhibits normal muscle tone. Gait normal. Coordination normal.  Skin: Skin is warm and dry. No rash  noted. Not diaphoretic. No erythema. No pallor.  Psychiatric: Mood, memory and judgment normal.  Vitals reviewed.  LABORATORY DATA: Lab Results  Component Value Date   WBC 3.8 (L) 03/01/2018   HGB 14.5 03/01/2018   HCT 43.8 03/01/2018   MCV 115.6 (H) 03/01/2018   PLT 200 03/01/2018      Chemistry      Component Value Date/Time   NA 136 03/01/2018 1058   NA 138 10/08/2017 0907   K 4.0 03/01/2018 1058   K 4.1 10/08/2017 0907   CL 104 03/01/2018 1058   CO2 23 03/01/2018 1058   CO2 24 10/08/2017 0907   BUN 11 03/01/2018 1058   BUN 11.7 10/08/2017 0907   CREATININE 1.22 03/01/2018 1058    CREATININE 1.2 10/08/2017 0907      Component Value Date/Time   CALCIUM 9.1 03/01/2018 1058   CALCIUM 9.5 10/08/2017 0907   ALKPHOS 82 03/01/2018 1058   ALKPHOS 88 10/08/2017 0907   AST 27 03/01/2018 1058   AST 22 10/08/2017 0907   ALT 30 03/01/2018 1058   ALT 24 10/08/2017 0907   BILITOT 0.5 03/01/2018 1058   BILITOT 0.42 10/08/2017 0907       RADIOGRAPHIC STUDIES:  No results found.   ASSESSMENT/PLAN:  Large cell carcinoma of left lung, stage 4 ([facility]) This is a very pleasant 52 years old white male with stage IV non-small cell lung cancer, high-grade neuroendocrine carcinoma presented with locally advanced disease in the chest as well as solitary brain metastasis and 2 liver lesions. The patient is status post stereotactic radiotherapy to the solitary brain metastasis as well as short course of concurrent chemoradiation to the locally advanced disease in the chest. He recently completed 6 cycles of systemic chemotherapy with carboplatin, Alimta and Avastin and tolerated this treatment fairly well. The patient is currently on maintenance treatment with Alimta and Avastin status post 11 cycles. He continues to tolerate the treatment well except for fatigue.  I recommended for him to proceed with cycle #12 today as a scheduled. For the lower extremity edema, the patient will continue his current treatment with Lasix on as-needed basis.  The patient plans to get compression stockings to help with his lower extremity edema. For the delayed nausea, the patient has a prescription for Zofran 8 mg p.o. every 8 hours as needed. The patient will have a restaging CT scan of the chest, abdomen, pelvis prior to his next visit. The patient will come back for follow-up visit in 3 weeks for evaluation before starting cycle #13.  He was advised to call if he has any concerning symptoms in the interval. The patient voices understanding of current disease status and treatment options and is in  agreement with the current care plan. All questions were answered. The patient knows to call the clinic with any problems, questions or concerns. We can certainly see the patient much sooner if necessary.   Orders Placed This Encounter  Procedures  . CT ABDOMEN PELVIS W CONTRAST    Standing Status:   Future    Standing Expiration Date:   03/02/2019    Order Specific Question:   If indicated for the ordered procedure, I authorize the administration of contrast media per Radiology protocol    Answer:   Yes    Order Specific Question:   Preferred imaging location?    Answer:   [facility]    Order Specific Question:   Radiology Contrast Protocol - do NOT remove file path  Answer:   \\[email or web address]    Order Specific Question:   Reason for Exam additional comments    Answer:   lung cancer. Restaging.  . CT CHEST W CONTRAST    Standing Status:   Future    Standing Expiration Date:   03/02/2019    Order Specific Question:   If indicated for the ordered procedure, I authorize the administration of contrast media per Radiology protocol    Answer:   Yes    Order Specific Question:   Preferred imaging location?    Answer:   [facility]    Order Specific Question:   Radiology Contrast Protocol - do NOT remove file path    Answer:   \\[email or web address]    Order Specific Question:   Reason for Exam additional comments    Answer:   Lung cancer. Restaging.   Mikey Bussing, DNP, AGPCNP-BC, AOCNP 03/01/18

## 2018-03-01 NOTE — Telephone Encounter (Signed)
Received call from Calvin Wagner wife asking for call back regarding taking vacation week of July 7-15.  They would like to have treatment June 28.  Return call to pt's wife's cell #.  Message routed to Dr Mohamed/Pod RN

## 2018-03-04 ENCOUNTER — Telehealth: Payer: Self-pay | Admitting: *Deleted

## 2018-03-04 NOTE — Telephone Encounter (Signed)
Wife Called request to have scan appt scheduled after the next treatment as wife will not be able to attend the 6/11 office visit and would like to be present for pt's results. Wife also asked if pt's 7/2 infusion appt could be moved to 6/28 or 7/1 as they have a beach trip scheduled and she would like him to feel as good as possible on the trip. Discussed with wife I will give her request to MD for review and she will be called with any additional changes. No further concerns.

## 2018-03-05 NOTE — Telephone Encounter (Signed)
Ok to delay the scan.  We will discuss the following treatment when I see him on March 22, 2018.

## 2018-03-08 ENCOUNTER — Telehealth: Payer: Self-pay | Admitting: Medical Oncology

## 2018-03-08 NOTE — Telephone Encounter (Signed)
Wife notified that scan will be delayed so they can go on vacation., She wants to know if tx can be done June 28th or July 1st - anniversary is July 2 and he wants to feel good on his vacation. I told her that Julien Nordmann will talk to them about it on June 11th.

## 2018-03-16 ENCOUNTER — Other Ambulatory Visit: Payer: Self-pay | Admitting: Internal Medicine

## 2018-03-22 ENCOUNTER — Encounter: Payer: Self-pay | Admitting: Internal Medicine

## 2018-03-22 ENCOUNTER — Inpatient Hospital Stay: Payer: 59 | Attending: Internal Medicine

## 2018-03-22 ENCOUNTER — Inpatient Hospital Stay (HOSPITAL_BASED_OUTPATIENT_CLINIC_OR_DEPARTMENT_OTHER): Payer: 59 | Admitting: Internal Medicine

## 2018-03-22 ENCOUNTER — Telehealth: Payer: Self-pay | Admitting: Internal Medicine

## 2018-03-22 ENCOUNTER — Inpatient Hospital Stay: Payer: 59

## 2018-03-22 VITALS — BP 155/102 | HR 123 | Temp 97.7°F | Resp 18 | Ht 73.0 in | Wt 243.6 lb

## 2018-03-22 VITALS — BP 148/93 | HR 98 | Temp 98.4°F | Resp 18

## 2018-03-22 DIAGNOSIS — Z79899 Other long term (current) drug therapy: Secondary | ICD-10-CM | POA: Diagnosis not present

## 2018-03-22 DIAGNOSIS — I10 Essential (primary) hypertension: Secondary | ICD-10-CM | POA: Diagnosis not present

## 2018-03-22 DIAGNOSIS — C3492 Malignant neoplasm of unspecified part of left bronchus or lung: Secondary | ICD-10-CM

## 2018-03-22 DIAGNOSIS — Z7901 Long term (current) use of anticoagulants: Secondary | ICD-10-CM | POA: Diagnosis not present

## 2018-03-22 DIAGNOSIS — C7A1 Malignant poorly differentiated neuroendocrine tumors: Secondary | ICD-10-CM | POA: Insufficient documentation

## 2018-03-22 DIAGNOSIS — Z5111 Encounter for antineoplastic chemotherapy: Secondary | ICD-10-CM

## 2018-03-22 DIAGNOSIS — C3490 Malignant neoplasm of unspecified part of unspecified bronchus or lung: Secondary | ICD-10-CM

## 2018-03-22 DIAGNOSIS — C7B8 Other secondary neuroendocrine tumors: Secondary | ICD-10-CM | POA: Diagnosis not present

## 2018-03-22 DIAGNOSIS — R110 Nausea: Secondary | ICD-10-CM | POA: Diagnosis not present

## 2018-03-22 DIAGNOSIS — Z5112 Encounter for antineoplastic immunotherapy: Secondary | ICD-10-CM | POA: Diagnosis not present

## 2018-03-22 LAB — CBC WITH DIFFERENTIAL/PLATELET
Basophils Absolute: 0 10*3/uL (ref 0.0–0.1)
Basophils Relative: 0 %
Eosinophils Absolute: 0 10*3/uL (ref 0.0–0.5)
Eosinophils Relative: 0 %
HCT: 45.2 % (ref 38.4–49.9)
Hemoglobin: 15.6 g/dL (ref 13.0–17.1)
Lymphocytes Relative: 8 %
Lymphs Abs: 0.4 10*3/uL — ABNORMAL LOW (ref 0.9–3.3)
MCH: 39.8 pg — ABNORMAL HIGH (ref 27.2–33.4)
MCHC: 34.4 g/dL (ref 32.0–36.0)
MCV: 115.8 fL — ABNORMAL HIGH (ref 79.3–98.0)
Monocytes Absolute: 0.2 10*3/uL (ref 0.1–0.9)
Monocytes Relative: 4 %
Neutro Abs: 4.4 10*3/uL (ref 1.5–6.5)
Neutrophils Relative %: 88 %
Platelets: 234 10*3/uL (ref 140–400)
RBC: 3.9 MIL/uL — ABNORMAL LOW (ref 4.20–5.82)
RDW: 15.2 % — ABNORMAL HIGH (ref 11.0–14.6)
WBC: 5 10*3/uL (ref 4.0–10.3)

## 2018-03-22 LAB — COMPREHENSIVE METABOLIC PANEL WITH GFR
ALT: 27 U/L (ref 0–55)
AST: 23 U/L (ref 5–34)
Albumin: 3.8 g/dL (ref 3.5–5.0)
Alkaline Phosphatase: 94 U/L (ref 40–150)
Anion gap: 12 — ABNORMAL HIGH (ref 3–11)
BUN: 13 mg/dL (ref 7–26)
CO2: 21 mmol/L — ABNORMAL LOW (ref 22–29)
Calcium: 9.8 mg/dL (ref 8.4–10.4)
Chloride: 102 mmol/L (ref 98–109)
Creatinine, Ser: 1.25 mg/dL (ref 0.70–1.30)
GFR calc Af Amer: >60 mL/min
GFR calc non Af Amer: >60 mL/min
Glucose, Bld: 203 mg/dL — ABNORMAL HIGH (ref 70–140)
Potassium: 4.2 mmol/L (ref 3.5–5.1)
Sodium: 135 mmol/L — ABNORMAL LOW (ref 136–145)
Total Bilirubin: 0.7 mg/dL (ref 0.2–1.2)
Total Protein: 7.9 g/dL (ref 6.4–8.3)

## 2018-03-22 LAB — TOTAL PROTEIN, URINE DIPSTICK: Protein, ur: <30 mg/dL — AB

## 2018-03-22 MED ORDER — SODIUM CHLORIDE 0.9 % IV SOLN
15.2000 mg/kg | Freq: Once | INTRAVENOUS | Status: AC
  Administered 2018-03-22: 1600 mg via INTRAVENOUS
  Filled 2018-03-22: qty 64

## 2018-03-22 MED ORDER — SODIUM CHLORIDE 0.9 % IV SOLN
510.0000 mg/m2 | Freq: Once | INTRAVENOUS | Status: AC
  Administered 2018-03-22: 1200 mg via INTRAVENOUS
  Filled 2018-03-22: qty 40

## 2018-03-22 MED ORDER — SODIUM CHLORIDE 0.9 % IV SOLN
Freq: Once | INTRAVENOUS | Status: AC
  Administered 2018-03-22: 13:00:00 via INTRAVENOUS

## 2018-03-22 MED ORDER — PROCHLORPERAZINE MALEATE 10 MG PO TABS
10.0000 mg | ORAL_TABLET | Freq: Once | ORAL | Status: AC
  Administered 2018-03-22: 10 mg via ORAL

## 2018-03-22 MED ORDER — SODIUM CHLORIDE 0.9 % IV SOLN: Freq: Once | INTRAVENOUS | Status: AC

## 2018-03-22 MED ORDER — PROCHLORPERAZINE MALEATE 10 MG PO TABS
ORAL_TABLET | ORAL | Status: AC
  Filled 2018-03-22: qty 1

## 2018-03-22 NOTE — Telephone Encounter (Signed)
Scheduled appt per 6/11 los - left message with with appt date and time.

## 2018-03-22 NOTE — Progress Notes (Signed)
[facility] [phone]   [phone]  OFFICE PROGRESS NOTE  [name], MD 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  PRIOR THERAPY:  1) Stereotactic radiotherapy to a solitary brain metastasis under the care of Dr. Lisbeth Renshaw on 01/25/2017. 2) Short course of concurrent chemoradiation with weekly carboplatin for AUC of 2 and paclitaxel 45 MG/M2 to the locally advanced disease in the chest. Status post 3 cycles. 3) Systemic chemotherapy with carboplatin for AUC of 5, Alimta 500 MG/M2 and Avastin 15 MG/KG every 3 weeks. First dose of 02/15/2017. Status post 6 cycles. Last dose 06/08/2017 with stable disease.   CURRENT THERAPY: Maintenance systemic chemotherapy with Alimta 500 MG/M2 in addition to Avastin 15 MG/KG every 3 weeks. First dose 07/13/2017.  Status post 12 cycles.  INTERVAL HISTORY: Calvin Wagner 52 y.o. male returns to the clinic today for follow-up visit.  The patient is feeling fine today with no specific complaints.  He denied having any chest pain, shortness of breath except with exertion.  He has no cough or hemoptysis.  He denied having any current nausea or vomiting.  He used Zofran after his chemotherapy for nausea as needed.  He denied having any recent weight loss or night sweats.  He continues to tolerate his maintenance treatment with Alimta and Avastin fairly well.  He is here for evaluation before starting cycle #13.  MEDICAL HISTORY: Past Medical History:  Diagnosis Date  . Encounter for antineoplastic chemotherapy 12/31/2016  . GERD 11/08/2009   Qualifier: Diagnosis of  By: Ronnald Ramp MD, Arvid Right Goals of  care, counseling/discussion 12/31/2016  . History of radiation therapy 01/13/2017 - 02/02/2017   Site/dose:  Left lung: 30 Gy in 15 fractions  . HYPERTENSION 11/08/2009   Qualifier: Diagnosis of  By: Ronnald Ramp MD, Arvid Right.   . Large cell carcinoma of left lung, stage 4 ([facility]) 12/31/2016  . Migraine 02/25/2012  . Pneumonia   . PONV (postoperative nausea and vomiting)   . Rotator cuff tear, left     ALLERGIES:  is allergic to no known allergies.  MEDICATIONS:  Current Outpatient Medications  Medication Sig Dispense Refill  . amLODipine (NORVASC) 5 MG tablet Take 5 mg by mouth daily.    . budesonide-formoterol (SYMBICORT) 80-4.5 MCG/ACT inhaler Inhale 2 puffs into the lungs 2 (two) times daily. 1 Inhaler 5  . chlorpheniramine-HYDROcodone (TUSSIONEX) 10-8 MG/5ML SUER Take 5 mLs by mouth every 12 (twelve) hours as needed for cough. (Patient not taking: Reported on 03/01/2018) 473 mL 0  . dexamethasone (DECADRON) 4 MG tablet 4 mg by mouth twice a day the day before, day of and day after the chemotherapy 40 tablet 1  . folic acid (FOLVITE) 1 MG tablet Take 1 tablet (1 mg total) by mouth daily. 30 tablet 4  . furosemide (LASIX) 20 MG tablet Take 1 tablet (20 mg total) by mouth daily as needed. 20 tablet 0  . LORazepam (ATIVAN) 0.5 MG tablet 1 tablet po q 4-6 hours prn anxiety and one po 30 minutes prior to radiation or MRI 30 tablet 0  . losartan (COZAAR) 50 MG tablet Take 50 mg by mouth daily.    . Multiple  Vitamin (MULTIVITAMIN WITH MINERALS) TABS tablet Take 1 tablet by mouth daily.    . ondansetron (ZOFRAN) 8 MG tablet Take 1 tablet (8 mg total) by mouth every 8 (eight) hours as needed for nausea or vomiting. 20 tablet 0  . pantoprazole (PROTONIX) 40 MG tablet Take 40 mg by mouth daily.  2  . prochlorperazine (COMPAZINE) 10 MG tablet TAKE 1 TABLET BY MOUTH  EVERY 6 HOURS AS NEEDED FOR NAUSEA OR VOMITING 30 tablet 0  . prochlorperazine (COMPAZINE) 10 MG tablet TAKE 1 TABLET BY MOUTH  EVERY 6 HOURS AS  NEEDED FOR NAUSEA OR VOMITING 30 tablet 1  . traMADol (ULTRAM) 50 MG tablet TAKE 1 TABLET BY MOUTH EVERY 12 HOURS AS NEEDED 30 tablet 0  . XARELTO 20 MG TABS tablet TAKE 1 TABLET BY MOUTH EVERY DAY WITH SUPPER 30 tablet 1   No current facility-administered medications for this visit.     SURGICAL HISTORY:  Past Surgical History:  Procedure Laterality Date  . INGUINAL HERNIA REPAIR    . SHOULDER ARTHROSCOPY WITH ROTATOR CUFF REPAIR Left 11/12/2016   Procedure: SHOULDER ARTHROSCOPY WITH ROTATOR CUFF REPAIR AND SUBACROMIAL DECOMPRESSION;  Surgeon: Tania Ade, MD;  Location: [facility];  Service: Orthopedics;  Laterality: Left;  SHOULDER ARTHROSCOPY WITH ROTATOR CUFF REPAIR AND SUBACROMIAL DECOMPRESSION  . TONSILLECTOMY    . TOTAL KNEE ARTHROPLASTY    . VIDEO BRONCHOSCOPY Bilateral 12/10/2016   Procedure: VIDEO BRONCHOSCOPY WITHOUT FLUORO;  Surgeon: Tanda Rockers, MD;  Location: [facility];  Service: Cardiopulmonary;  Laterality: Bilateral;    REVIEW OF SYSTEMS:  A comprehensive review of systems was negative except for: Constitutional: positive for fatigue Respiratory: positive for dyspnea on exertion   PHYSICAL EXAMINATION: General appearance: alert, cooperative, fatigued and no distress Head: Normocephalic, without obvious abnormality, atraumatic Neck: no adenopathy, no JVD, supple, symmetrical, trachea midline and thyroid not enlarged, symmetric, no tenderness/mass/nodules Lymph nodes: Cervical, supraclavicular, and axillary nodes normal. Resp: clear to auscultation bilaterally Back: symmetric, no curvature. ROM normal. No CVA tenderness. Cardio: regular rate and rhythm, S1, S2 normal, no murmur, click, rub or gallop GI: soft, non-tender; bowel sounds normal; no masses,  no organomegaly Extremities: edema 1+  ECOG PERFORMANCE STATUS: 1 - Symptomatic but completely ambulatory  Blood pressure (!) 155/102, pulse (!) 123, temperature 97.7 F (36.5 C), temperature source Oral, resp. rate  18, height 6\' 1"  (1.854 m), weight 243 lb 9.6 oz (110.5 kg), SpO2 100 %.  LABORATORY DATA: Lab Results  Component Value Date   WBC 5.0 03/22/2018   HGB 15.6 03/22/2018   HCT 45.2 03/22/2018   MCV 115.8 (H) 03/22/2018   PLT 234 03/22/2018      Chemistry      Component Value Date/Time   NA 136 03/01/2018 1058   NA 138 10/08/2017 0907   K 4.0 03/01/2018 1058   K 4.1 10/08/2017 0907   CL 104 03/01/2018 1058   CO2 23 03/01/2018 1058   CO2 24 10/08/2017 0907   BUN 11 03/01/2018 1058   BUN 11.7 10/08/2017 0907   CREATININE 1.22 03/01/2018 1058   CREATININE 1.2 10/08/2017 0907      Component Value Date/Time   CALCIUM 9.1 03/01/2018 1058   CALCIUM 9.5 10/08/2017 0907   ALKPHOS 82 03/01/2018 1058   ALKPHOS 88 10/08/2017 0907   AST 27 03/01/2018 1058   AST 22 10/08/2017 0907   ALT 30 03/01/2018 1058   ALT 24 10/08/2017 0907   BILITOT 0.5 03/01/2018 1058   BILITOT  0.42 10/08/2017 0907       RADIOGRAPHIC STUDIES: No results found.  ASSESSMENT AND PLAN: This is a very pleasant 52 years old white male with stage IV non-small cell lung cancer, high-grade neuroendocrine carcinoma presented with locally advanced disease in the chest as well as solitary brain metastasis and 2 liver lesions. The patient is status post stereotactic radiotherapy to the solitary brain metastasis as well as short course of concurrent chemoradiation to the locally advanced disease in the chest. He recently completed 6 cycles of systemic chemotherapy with carboplatin, Alimta and Avastin and tolerated this treatment fairly well. The patient is currently on maintenance treatment with Alimta and Avastin status post 12 cycles. The patient continues to tolerate his treatment fairly well.  Will proceed with cycle #13 today as a schedule. He will come back for follow-up visit in 3 weeks for evaluation before the next cycle of his treatment. For the lower extremity edema, the patient will continue his current  treatment with Lasix on as-needed basis. For the delayed nausea, I will start the patient on Zofran 8 mg p.o. every 8 hours as needed. The patient was advised to call immediately if he has any concerning symptoms in the interval. The patient voices understanding of current disease status and treatment options and is in agreement with the current care plan. All questions were answered. The patient knows to call the clinic with any problems, questions or concerns. We can certainly see the patient much sooner if necessary.  Disclaimer: This note was dictated with voice recognition software. Similar sounding words can inadvertently be transcribed and may not be corrected upon review.

## 2018-03-22 NOTE — Patient Instructions (Signed)
[facility] Discharge Instructions for Patients Receiving Chemotherapy  Today you received the following chemotherapy agents Alimta and Avastin  To help prevent nausea and vomiting after your treatment, we encourage you to take your nausea medication as directed   If you develop nausea and vomiting that is not controlled by your nausea medication, call the clinic.   BELOW ARE SYMPTOMS THAT SHOULD BE REPORTED IMMEDIATELY:  *FEVER GREATER THAN 100.5 F  *CHILLS WITH OR WITHOUT FEVER  NAUSEA AND VOMITING THAT IS NOT CONTROLLED WITH YOUR NAUSEA MEDICATION  *UNUSUAL SHORTNESS OF BREATH  *UNUSUAL BRUISING OR BLEEDING  TENDERNESS IN MOUTH AND THROAT WITH OR WITHOUT PRESENCE OF ULCERS  *URINARY PROBLEMS  *BOWEL PROBLEMS  UNUSUAL RASH Items with * indicate a potential emergency and should be followed up as soon as possible.  Feel free to call the clinic should you have any questions or concerns. The clinic phone number is [phone].  Please show the [facility] at check-in to the Emergency Department and triage nurse.

## 2018-03-25 ENCOUNTER — Other Ambulatory Visit: Payer: Self-pay | Admitting: Internal Medicine

## 2018-03-29 ENCOUNTER — Other Ambulatory Visit: Payer: Self-pay | Admitting: Internal Medicine

## 2018-03-29 DIAGNOSIS — C3492 Malignant neoplasm of unspecified part of left bronchus or lung: Secondary | ICD-10-CM

## 2018-03-30 ENCOUNTER — Telehealth: Payer: Self-pay

## 2018-03-30 ENCOUNTER — Telehealth: Payer: Self-pay | Admitting: *Deleted

## 2018-03-30 NOTE — Telephone Encounter (Signed)
Patient wanted verification of upcoming appointment for MRI. Per 6/19 phone msg return

## 2018-03-30 NOTE — Telephone Encounter (Signed)
Pt wife called this am regarding CT scan. MD to see pt 7/1 with scan completed to discuss results.  Wife advised she has not received a call about CT appt. Spoke with CS ad Managed care, numerous attempts made to reach pt and they were unsuccessful.  Gave pt's wife # to Central scheduling to make pt's appt as she requesting Brain MRI and CT CAP be done same day. No further concerns.

## 2018-04-07 ENCOUNTER — Ambulatory Visit (HOSPITAL_COMMUNITY): Payer: 59

## 2018-04-07 ENCOUNTER — Ambulatory Visit (HOSPITAL_COMMUNITY)
Admission: RE | Admit: 2018-04-07 | Discharge: 2018-04-07 | Disposition: A | Discharge status: Home / Self Care | Payer: 59 | Source: Ambulatory Visit | Attending: Oncology | Admitting: Oncology

## 2018-04-07 DIAGNOSIS — Z5111 Encounter for antineoplastic chemotherapy: Secondary | ICD-10-CM | POA: Diagnosis not present

## 2018-04-07 DIAGNOSIS — K760 Fatty (change of) liver, not elsewhere classified: Secondary | ICD-10-CM | POA: Diagnosis not present

## 2018-04-07 DIAGNOSIS — I700 Atherosclerosis of aorta: Secondary | ICD-10-CM | POA: Diagnosis not present

## 2018-04-07 DIAGNOSIS — R918 Other nonspecific abnormal finding of lung field: Secondary | ICD-10-CM | POA: Diagnosis not present

## 2018-04-07 DIAGNOSIS — C3492 Malignant neoplasm of unspecified part of left bronchus or lung: Secondary | ICD-10-CM | POA: Insufficient documentation

## 2018-04-07 DIAGNOSIS — M47896 Other spondylosis, lumbar region: Secondary | ICD-10-CM | POA: Diagnosis not present

## 2018-04-07 DIAGNOSIS — M5136 Other intervertebral disc degeneration, lumbar region: Secondary | ICD-10-CM | POA: Insufficient documentation

## 2018-04-07 DIAGNOSIS — C3490 Malignant neoplasm of unspecified part of unspecified bronchus or lung: Secondary | ICD-10-CM | POA: Diagnosis not present

## 2018-04-07 MED ORDER — IOPAMIDOL (ISOVUE-300) INJECTION 61%
INTRAVENOUS | Status: AC
  Filled 2018-04-07: qty 100

## 2018-04-07 MED ORDER — IOPAMIDOL (ISOVUE-300) INJECTION 61%
100.0000 mL | Freq: Once | INTRAVENOUS | Status: AC | PRN
  Administered 2018-04-07: 100 mL via INTRAVENOUS

## 2018-04-11 ENCOUNTER — Inpatient Hospital Stay (HOSPITAL_BASED_OUTPATIENT_CLINIC_OR_DEPARTMENT_OTHER): Payer: 59 | Admitting: Internal Medicine

## 2018-04-11 ENCOUNTER — Telehealth: Payer: Self-pay | Admitting: Internal Medicine

## 2018-04-11 ENCOUNTER — Inpatient Hospital Stay: Payer: 59

## 2018-04-11 ENCOUNTER — Encounter: Payer: Self-pay | Admitting: Internal Medicine

## 2018-04-11 ENCOUNTER — Other Ambulatory Visit: Payer: Self-pay | Admitting: *Deleted

## 2018-04-11 ENCOUNTER — Inpatient Hospital Stay: Payer: 59 | Attending: Internal Medicine

## 2018-04-11 VITALS — BP 137/91 | HR 106 | Temp 98.5°F | Resp 18 | Ht 73.0 in | Wt 243.9 lb

## 2018-04-11 VITALS — BP 130/84 | HR 89

## 2018-04-11 DIAGNOSIS — Z5112 Encounter for antineoplastic immunotherapy: Secondary | ICD-10-CM | POA: Insufficient documentation

## 2018-04-11 DIAGNOSIS — I10 Essential (primary) hypertension: Secondary | ICD-10-CM | POA: Diagnosis not present

## 2018-04-11 DIAGNOSIS — Z79899 Other long term (current) drug therapy: Secondary | ICD-10-CM

## 2018-04-11 DIAGNOSIS — Z5111 Encounter for antineoplastic chemotherapy: Secondary | ICD-10-CM

## 2018-04-11 DIAGNOSIS — R110 Nausea: Secondary | ICD-10-CM | POA: Insufficient documentation

## 2018-04-11 DIAGNOSIS — Z923 Personal history of irradiation: Secondary | ICD-10-CM | POA: Diagnosis not present

## 2018-04-11 DIAGNOSIS — C3492 Malignant neoplasm of unspecified part of left bronchus or lung: Secondary | ICD-10-CM

## 2018-04-11 DIAGNOSIS — C7A1 Malignant poorly differentiated neuroendocrine tumors: Secondary | ICD-10-CM | POA: Diagnosis not present

## 2018-04-11 DIAGNOSIS — C7B8 Other secondary neuroendocrine tumors: Secondary | ICD-10-CM | POA: Diagnosis not present

## 2018-04-11 LAB — CBC WITH DIFFERENTIAL/PLATELET
Basophils Absolute: 0 10*3/uL (ref 0.0–0.1)
Basophils Relative: 1 %
Eosinophils Absolute: 0.1 10*3/uL (ref 0.0–0.5)
Eosinophils Relative: 2 %
HCT: 43 % (ref 38.4–49.9)
Hemoglobin: 14.7 g/dL (ref 13.0–17.1)
Lymphocytes Relative: 17 %
Lymphs Abs: 0.7 10*3/uL — ABNORMAL LOW (ref 0.9–3.3)
MCH: 39.4 pg — ABNORMAL HIGH (ref 27.2–33.4)
MCHC: 34.1 g/dL (ref 32.0–36.0)
MCV: 115.5 fL — ABNORMAL HIGH (ref 79.3–98.0)
Monocytes Absolute: 0.6 10*3/uL (ref 0.1–0.9)
Monocytes Relative: 15 %
Neutro Abs: 2.6 10*3/uL (ref 1.5–6.5)
Neutrophils Relative %: 65 %
Platelets: 228 10*3/uL (ref 140–400)
RBC: 3.72 MIL/uL — ABNORMAL LOW (ref 4.20–5.82)
RDW: 15.2 % — ABNORMAL HIGH (ref 11.0–14.6)
WBC: 4 10*3/uL (ref 4.0–10.3)

## 2018-04-11 LAB — COMPREHENSIVE METABOLIC PANEL
ALT: 30 U/L (ref 0–44)
AST: 28 U/L (ref 15–41)
Albumin: 3.8 g/dL (ref 3.5–5.0)
Alkaline Phosphatase: 103 U/L (ref 38–126)
Anion gap: 9 (ref 5–15)
BUN: 13 mg/dL (ref 6–20)
CO2: 26 mmol/L (ref 22–32)
Calcium: 9.3 mg/dL (ref 8.9–10.3)
Chloride: 102 mmol/L (ref 98–111)
Creatinine, Ser: 1.19 mg/dL (ref 0.61–1.24)
GFR calc Af Amer: >60 mL/min (ref >60)
GFR calc non Af Amer: >60 mL/min (ref >60)
Glucose, Bld: 134 mg/dL — ABNORMAL HIGH (ref 70–99)
Potassium: 3.8 mmol/L (ref 3.5–5.1)
Sodium: 137 mmol/L (ref 135–145)
Total Bilirubin: 0.6 mg/dL (ref 0.3–1.2)
Total Protein: 7.4 g/dL (ref 6.5–8.1)

## 2018-04-11 LAB — TOTAL PROTEIN, URINE DIPSTICK: Protein, ur: NEGATIVE mg/dL

## 2018-04-11 MED ORDER — ONDANSETRON HCL 8 MG PO TABS: ORAL_TABLET | ORAL | 1 refills | Status: DC

## 2018-04-11 MED ORDER — PROCHLORPERAZINE MALEATE 10 MG PO TABS
ORAL_TABLET | ORAL | Status: AC
  Filled 2018-04-11: qty 1

## 2018-04-11 MED ORDER — SODIUM CHLORIDE 0.9 % IV SOLN
Freq: Once | INTRAVENOUS | Status: AC
  Administered 2018-04-11: 10:00:00 via INTRAVENOUS

## 2018-04-11 MED ORDER — BEVACIZUMAB CHEMO INJECTION 400 MG/16ML
1600.0000 mg | Freq: Once | INTRAVENOUS | Status: AC
  Administered 2018-04-11: 1600 mg via INTRAVENOUS
  Filled 2018-04-11: qty 64

## 2018-04-11 MED ORDER — PROCHLORPERAZINE MALEATE 10 MG PO TABS
10.0000 mg | ORAL_TABLET | Freq: Once | ORAL | Status: AC
  Administered 2018-04-11: 10 mg via ORAL

## 2018-04-11 MED ORDER — SODIUM CHLORIDE 0.9 % IV SOLN
510.0000 mg/m2 | Freq: Once | INTRAVENOUS | Status: AC
  Administered 2018-04-11: 1200 mg via INTRAVENOUS
  Filled 2018-04-11: qty 40

## 2018-04-11 NOTE — Telephone Encounter (Signed)
3 cycles already scheduled per 7/1 los - no additional appts added.

## 2018-04-11 NOTE — Progress Notes (Signed)
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  PRIOR THERAPY:  1) Stereotactic radiotherapy to a solitary brain metastasis under the care of Dr. Lisbeth Renshaw on 01/25/2017. 2) Short course of concurrent chemoradiation with weekly carboplatin for AUC of 2 and paclitaxel 45 MG/M2 to the locally advanced disease in the chest. Status post 3 cycles. 3) Systemic chemotherapy with carboplatin for AUC of 5, Alimta 500 MG/M2 and Avastin 15 MG/KG every 3 weeks. First dose of 02/15/2017. Status post 6 cycles. Last dose 06/08/2017 with stable disease.   CURRENT THERAPY: Maintenance systemic chemotherapy with Alimta 500 MG/M2 in addition to Avastin 15 MG/KG every 3 weeks. First dose 07/13/2017.  Status post 13 cycles.  INTERVAL HISTORY: Calvin Wagner 52 y.o. male returns to the clinic today for follow-up visit accompanied by his wife.  The patient is feeling fine today with no concerning complaints except for the mild swelling of the lower extremities and occasional nausea.  His nausea has significantly improved after using Zofran and sometimes he use Compazine 2.  He denied having any chest pain, shortness of breath, cough or hemoptysis.  He denied having any weight loss or night sweats.  He has no nausea, vomiting, diarrhea or constipation.  He denied having any fever or chills.  He is here today for evaluation with repeat CT scan of the chest, abdomen and pelvis for restaging of his disease before starting cycle #14.   MEDICAL HISTORY: Past Medical History:  Diagnosis Date  .  Encounter for antineoplastic chemotherapy 12/31/2016  . GERD 11/08/2009   Qualifier: Diagnosis of  By: Ronnald Ramp MD, Arvid Right Goals of care, counseling/discussion 12/31/2016  . History of radiation therapy 01/13/2017 - 02/02/2017   Site/dose:  Left lung: 30 Gy in 15 fractions  . HYPERTENSION 11/08/2009   Qualifier: Diagnosis of  By: Ronnald Ramp MD, Arvid Right.   . Large cell carcinoma of left lung, stage 4 ([place]) 12/31/2016  . Migraine 02/25/2012  . Pneumonia   . PONV (postoperative nausea and vomiting)   . Rotator cuff tear, left     ALLERGIES:  is allergic to no known allergies.  MEDICATIONS:  Current Outpatient Medications  Medication Sig Dispense Refill  . amLODipine (NORVASC) 5 MG tablet Take 5 mg by mouth daily.    . budesonide-formoterol (SYMBICORT) 80-4.5 MCG/ACT inhaler Inhale 2 puffs into the lungs 2 (two) times daily. 1 Inhaler 5  . chlorpheniramine-HYDROcodone (TUSSIONEX) 10-8 MG/5ML SUER Take 5 mLs by mouth every 12 (twelve) hours as needed for cough. (Patient not taking: Reported on 03/01/2018) 473 mL 0  . dexamethasone (DECADRON) 4 MG tablet 4 mg by mouth twice a day the day before, day of and day after the chemotherapy 40 tablet 1  . folic acid (FOLVITE) 1 MG tablet Take 1 tablet (1 mg total) by mouth daily. 30 tablet 4  . furosemide (LASIX) 20 MG tablet TAKE 1 TABLET BY MOUTH EVERY DAY AS NEEDED 20 tablet 0  . LORazepam (ATIVAN) 0.5 MG tablet TAKE 1 TAB BY MOUTH EVERY 4 TO 6 HRS AS NEEDED FOR ANXIETY AND 1 TAB 1HR  PRIOR TO RADIATION/MRI 30 tablet 0  . losartan (COZAAR) 50 MG tablet Take 50 mg by mouth daily.    . Multiple Vitamin (MULTIVITAMIN WITH MINERALS) TABS tablet Take 1 tablet by mouth daily.    . ondansetron (ZOFRAN) 8 MG tablet TAKE 1 TABLET BY MOUTH EVERY 8 HOURS AS NEEDED FOR NAUSEA OR VOMITING. 20 tablet 0  . pantoprazole (PROTONIX) 40 MG tablet Take 40 mg by mouth daily.  2  . prochlorperazine (COMPAZINE) 10 MG tablet TAKE 1 TABLET BY MOUTH  EVERY 6 HOURS AS NEEDED FOR  NAUSEA OR VOMITING 30 tablet 0  . prochlorperazine (COMPAZINE) 10 MG tablet TAKE 1 TABLET BY MOUTH  EVERY 6 HOURS AS NEEDED FOR NAUSEA OR VOMITING 30 tablet 1  . traMADol (ULTRAM) 50 MG tablet TAKE 1 TABLET BY MOUTH EVERY 12 HOURS AS NEEDED 30 tablet 0  . XARELTO 20 MG TABS tablet TAKE 1 TABLET BY MOUTH EVERY DAY WITH SUPPER 30 tablet 1   No current facility-administered medications for this visit.     SURGICAL HISTORY:  Past Surgical History:  Procedure Laterality Date  . INGUINAL HERNIA REPAIR    . SHOULDER ARTHROSCOPY WITH ROTATOR CUFF REPAIR Left 11/12/2016   Procedure: SHOULDER ARTHROSCOPY WITH ROTATOR CUFF REPAIR AND SUBACROMIAL DECOMPRESSION;  Surgeon: Tania Ade, MD;  Location: [facility];  Service: Orthopedics;  Laterality: Left;  SHOULDER ARTHROSCOPY WITH ROTATOR CUFF REPAIR AND SUBACROMIAL DECOMPRESSION  . TONSILLECTOMY    . TOTAL KNEE ARTHROPLASTY    . VIDEO BRONCHOSCOPY Bilateral 12/10/2016   Procedure: VIDEO BRONCHOSCOPY WITHOUT FLUORO;  Surgeon: Tanda Rockers, MD;  Location: [facility];  Service: Cardiopulmonary;  Laterality: Bilateral;    REVIEW OF SYSTEMS:  Constitutional: negative Eyes: negative Ears, nose, mouth, throat, and face: negative Respiratory: negative Cardiovascular: negative Gastrointestinal: negative Genitourinary:negative Integument/breast: negative Hematologic/lymphatic: negative Musculoskeletal:negative Neurological: negative Behavioral/Psych: negative Endocrine: negative Allergic/Immunologic: negative   PHYSICAL EXAMINATION: General appearance: alert, cooperative and no distress Head: Normocephalic, without obvious abnormality, atraumatic Neck: no adenopathy, no JVD, supple, symmetrical, trachea midline and thyroid not enlarged, symmetric, no tenderness/mass/nodules Lymph nodes: Cervical, supraclavicular, and axillary nodes normal. Resp: clear to auscultation bilaterally Back: symmetric, no curvature. ROM normal. No CVA tenderness. Cardio:  regular rate and rhythm, S1, S2 normal, no murmur, click, rub or gallop GI: soft, non-tender; bowel sounds normal; no masses,  no organomegaly Extremities: edema 1+ Neurologic: Alert and oriented X 3, normal strength and tone. Normal symmetric reflexes. Normal coordination and gait  ECOG PERFORMANCE STATUS: 1 - Symptomatic but completely ambulatory  Blood pressure (!) 137/91, pulse (!) 106, temperature 98.5 F (36.9 C), temperature source Oral, resp. rate 18, height 6\' 1"  (1.854 m), weight 243 lb 14.4 oz (110.6 kg), SpO2 100 %.  LABORATORY DATA: Lab Results  Component Value Date   WBC 4.0 04/11/2018   HGB 14.7 04/11/2018   HCT 43.0 04/11/2018   MCV 115.5 (H) 04/11/2018   PLT 228 04/11/2018      Chemistry      Component Value Date/Time   NA 135 (L) 03/22/2018 1108   NA 138 10/08/2017 0907   K 4.2 03/22/2018 1108   K 4.1 10/08/2017 0907   CL 102 03/22/2018 1108   CO2 21 (L) 03/22/2018 1108   CO2 24 10/08/2017 0907   BUN 13 03/22/2018 1108   BUN 11.7 10/08/2017 0907   CREATININE 1.25 03/22/2018 1108   CREATININE 1.2 10/08/2017 0907      Component Value Date/Time   CALCIUM 9.8 03/22/2018 1108  CALCIUM 9.5 10/08/2017 0907   ALKPHOS 94 03/22/2018 1108   ALKPHOS 88 10/08/2017 0907   AST 23 03/22/2018 1108   AST 22 10/08/2017 0907   ALT 27 03/22/2018 1108   ALT 24 10/08/2017 0907   BILITOT 0.7 03/22/2018 1108   BILITOT 0.42 10/08/2017 0907       RADIOGRAPHIC STUDIES: Ct Chest W Contrast  Result Date: 04/08/2018 CLINICAL DATA:  Metastatic non-small cell lung cancer with poorly differentiated high-grade neuroendocrine carcinoma, prior stereotactic radiotherapy of a solitary brain metastatic lesion and chemotherapy. EXAM: CT CHEST, ABDOMEN, AND PELVIS WITH CONTRAST TECHNIQUE: Multidetector CT imaging of the chest, abdomen and pelvis was performed following the standard protocol during bolus administration of intravenous contrast. CONTRAST:  176mL ISOVUE-300 IOPAMIDOL  (ISOVUE-300) INJECTION 61% COMPARISON:  01/17/2018 FINDINGS: CT CHEST FINDINGS Cardiovascular: Unremarkable Mediastinum/Nodes: Unremarkable Lungs/Pleura: Left lower lobe nodule 1.2 by 1.0 cm on image 133/4, previously 1.0 by 0.9 cm on 06/08/2016, and previously mildly hypermetabolic. Stable perihilar ground-glass opacities in the left upper lobe and superior segment left lower lobe likely related to interval radiation therapy, without root residual significant mass identified. Nodal tissue interposed between the left lower lobe pulmonary artery and the lingular bronchus measures up to 8 mm in short axis, previously the same. Musculoskeletal: Mild levoconvex upper thoracic scoliosis. CT ABDOMEN PELVIS FINDINGS Hepatobiliary: Suspected diffuse hepatic steatosis. No well-defined focal hepatic lesion observed. Gallbladder unremarkable. Pancreas: Unremarkable Spleen: Unremarkable Adrenals/Urinary Tract: Adrenal glands normal. Exophytic 1.1 cm hypodense lesion from the right kidney lower pole is stable and probably a cyst although technically too small to characterize. Stomach/Bowel: Unremarkable Vascular/Lymphatic: Aortoiliac atherosclerotic vascular disease. Reproductive: Chronic mild stranding around the seminal vesicles. Punctate calcifications centrally in the prostate gland. Other: No supplemental non-categorized findings. Musculoskeletal: Lower anterior abdominopelvic ventral hernia mesh. Degenerative disc disease and spondylosis at L5-S1. Facet arthropathy and degenerative disc disease cause suspected mild foraminal impingement bilaterally at L3-4. Small umbilical hernia contains adipose tissue. IMPRESSION: 1. The left lower lobe nodule currently measures 1.2 by 1.0 cm. This has very minimally increased in size over the past 2 years, and measured 1.0 by 0.9 cm in 2017. The lesion has low-grade metabolic activity on a PET-CT from 2018, and could reflect low-grade malignancy. 2. No recurrence along the left  perihilar region. Left perihilar ground-glass opacity favoring radiation pneumonitis/fibrosis. 3. Other imaging findings of potential clinical significance: Aortic Atherosclerosis (ICD10-I70.0). Diffuse hepatic steatosis. Lumbar spondylosis and degenerative disc disease. Electronically Signed   By: Van Clines M.D.   On: 04/08/2018 11:35   Ct Abdomen Pelvis W Contrast  Result Date: 04/08/2018 CLINICAL DATA:  Metastatic non-small cell lung cancer with poorly differentiated high-grade neuroendocrine carcinoma, prior stereotactic radiotherapy of a solitary brain metastatic lesion and chemotherapy. EXAM: CT CHEST, ABDOMEN, AND PELVIS WITH CONTRAST TECHNIQUE: Multidetector CT imaging of the chest, abdomen and pelvis was performed following the standard protocol during bolus administration of intravenous contrast. CONTRAST:  187mL ISOVUE-300 IOPAMIDOL (ISOVUE-300) INJECTION 61% COMPARISON:  01/17/2018 FINDINGS: CT CHEST FINDINGS Cardiovascular: Unremarkable Mediastinum/Nodes: Unremarkable Lungs/Pleura: Left lower lobe nodule 1.2 by 1.0 cm on image 133/4, previously 1.0 by 0.9 cm on 06/08/2016, and previously mildly hypermetabolic. Stable perihilar ground-glass opacities in the left upper lobe and superior segment left lower lobe likely related to interval radiation therapy, without root residual significant mass identified. Nodal tissue interposed between the left lower lobe pulmonary artery and the lingular bronchus measures up to 8 mm in short axis, previously the same. Musculoskeletal: Mild levoconvex upper thoracic scoliosis. CT  ABDOMEN PELVIS FINDINGS Hepatobiliary: Suspected diffuse hepatic steatosis. No well-defined focal hepatic lesion observed. Gallbladder unremarkable. Pancreas: Unremarkable Spleen: Unremarkable Adrenals/Urinary Tract: Adrenal glands normal. Exophytic 1.1 cm hypodense lesion from the right kidney lower pole is stable and probably a cyst although technically too small to characterize.  Stomach/Bowel: Unremarkable Vascular/Lymphatic: Aortoiliac atherosclerotic vascular disease. Reproductive: Chronic mild stranding around the seminal vesicles. Punctate calcifications centrally in the prostate gland. Other: No supplemental non-categorized findings. Musculoskeletal: Lower anterior abdominopelvic ventral hernia mesh. Degenerative disc disease and spondylosis at L5-S1. Facet arthropathy and degenerative disc disease cause suspected mild foraminal impingement bilaterally at L3-4. Small umbilical hernia contains adipose tissue. IMPRESSION: 1. The left lower lobe nodule currently measures 1.2 by 1.0 cm. This has very minimally increased in size over the past 2 years, and measured 1.0 by 0.9 cm in 2017. The lesion has low-grade metabolic activity on a PET-CT from 2018, and could reflect low-grade malignancy. 2. No recurrence along the left perihilar region. Left perihilar ground-glass opacity favoring radiation pneumonitis/fibrosis. 3. Other imaging findings of potential clinical significance: Aortic Atherosclerosis (ICD10-I70.0). Diffuse hepatic steatosis. Lumbar spondylosis and degenerative disc disease. Electronically Signed   By: Van Clines M.D.   On: 04/08/2018 11:35    ASSESSMENT AND PLAN: This is a very pleasant 52 years old white male with stage IV non-small cell lung cancer, high-grade neuroendocrine carcinoma presented with locally advanced disease in the chest as well as solitary brain metastasis and 2 liver lesions. The patient is status post stereotactic radiotherapy to the solitary brain metastasis as well as short course of concurrent chemoradiation to the locally advanced disease in the chest. He recently completed 6 cycles of systemic chemotherapy with carboplatin, Alimta and Avastin and tolerated this treatment fairly well. The patient is currently on maintenance treatment with Alimta and Avastin status post 13 cycles. He has been tolerating this treatment well with no  concerning complaints. Repeat CT scan of the chest, abdomen and pelvis were performed recently.  I personally and independently reviewed the scan images and discussed the results with the patient and his wife.  His a scan showed no concerning findings for disease progression except for a slightly enlarging left lower lobe nodule that minimally increased compared to 2 years ago. I recommended for the patient to continue his current treatment with maintenance Alimta and Avastin and he will proceed with cycle #14 today. For hypertension he will continue to monitor his blood pressure closely. I will see the patient back for follow-up visit in 3 weeks for evaluation before the next cycle of his treatment. He was advised to call immediately if he has any concerning symptoms in the interval. The patient voices understanding of current disease status and treatment options and is in agreement with the current care plan. All questions were answered. The patient knows to call the clinic with any problems, questions or concerns. We can certainly see the patient much sooner if necessary.  Disclaimer: This note was dictated with voice recognition software. Similar sounding words can inadvertently be transcribed and may not be corrected upon review.

## 2018-04-11 NOTE — Patient Instructions (Signed)
[facility] Discharge Instructions for Patients Receiving Chemotherapy  Today you received the following chemotherapy agents Alimta and Avastin  To help prevent nausea and vomiting after your treatment, we encourage you to take your nausea medication as directed   If you develop nausea and vomiting that is not controlled by your nausea medication, call the clinic.   BELOW ARE SYMPTOMS THAT SHOULD BE REPORTED IMMEDIATELY:  *FEVER GREATER THAN 100.5 F  *CHILLS WITH OR WITHOUT FEVER  NAUSEA AND VOMITING THAT IS NOT CONTROLLED WITH YOUR NAUSEA MEDICATION  *UNUSUAL SHORTNESS OF BREATH  *UNUSUAL BRUISING OR BLEEDING  TENDERNESS IN MOUTH AND THROAT WITH OR WITHOUT PRESENCE OF ULCERS  *URINARY PROBLEMS  *BOWEL PROBLEMS  UNUSUAL RASH Items with * indicate a potential emergency and should be followed up as soon as possible.  Feel free to call the clinic should you have any questions or concerns. The clinic phone number is [phone].  Please show the [facility] at check-in to the Emergency Department and triage nurse.

## 2018-04-11 NOTE — Progress Notes (Signed)
MD reviewed vital signs ok to treat despite elevated BP and heart rate.

## 2018-04-12 ENCOUNTER — Ambulatory Visit: Payer: 59

## 2018-04-12 ENCOUNTER — Ambulatory Visit: Payer: 59 | Admitting: Internal Medicine

## 2018-04-12 ENCOUNTER — Other Ambulatory Visit: Payer: 59

## 2018-04-20 NOTE — Progress Notes (Addendum)
Calvin Wagner 53 y.o. man with Stage IV(T1a, N2, M1b) non-small cell lung cancer consistent with poorly differentiated high-grade neuroendocrine carcinoma of the left lung with liver and brain metastases completed radiation 02-02-17 review 04-28-18 MRI brain w wo contrast FU.   Headache:No Pain:Yes abdomen 6/10 Tramdaol Dizziness:Yes Nausea/vomiting:Nausea Zofran and compazine Ringing in ears: Right ear at times comes and goes Visual changes (Blurred/ diplopia double vision,blind spots, and peripheral vsion changes):Blurred vision in the right ear, wears reading glasses Fatigue:Yes Cognitive changes:No Wt Readings from Last 3 Encounters:  04/11/18 243 lb 14.4 oz (110.6 kg)  03/22/18 243 lb 9.6 oz (110.5 kg)  03/01/18 245 lb 3.2 oz (111.2 kg)   BP (!) 137/93 (BP Location: Left Arm, Patient Position: Bed low/side rails up, Cuff Size: Large)   Pulse 99   Temp 99 F (37.2 C) (Oral)   Resp 20   Ht 6\' 1"  (1.854 m)   Wt 240 lb 9.6 oz (109.1 kg)   SpO2 100%   BMI 31.74 kg/m

## 2018-04-21 ENCOUNTER — Other Ambulatory Visit: Payer: 59

## 2018-04-25 ENCOUNTER — Ambulatory Visit: Payer: Self-pay | Admitting: Radiation Oncology

## 2018-04-28 ENCOUNTER — Ambulatory Visit
Admission: RE | Admit: 2018-04-28 | Discharge: 2018-04-28 | Disposition: A | Discharge status: Home / Self Care | Payer: 59 | Source: Ambulatory Visit | Attending: Radiation Oncology | Admitting: Radiation Oncology

## 2018-04-28 DIAGNOSIS — C7949 Secondary malignant neoplasm of other parts of nervous system: Principal | ICD-10-CM

## 2018-04-28 DIAGNOSIS — C7931 Secondary malignant neoplasm of brain: Secondary | ICD-10-CM

## 2018-04-28 DIAGNOSIS — C3490 Malignant neoplasm of unspecified part of unspecified bronchus or lung: Secondary | ICD-10-CM | POA: Diagnosis not present

## 2018-04-28 MED ORDER — GADOBENATE DIMEGLUMINE 529 MG/ML IV SOLN
20.0000 mL | Freq: Once | INTRAVENOUS | Status: AC | PRN
  Administered 2018-04-28: 20 mL via INTRAVENOUS

## 2018-05-03 ENCOUNTER — Encounter: Payer: Self-pay | Admitting: Radiation Oncology

## 2018-05-03 ENCOUNTER — Inpatient Hospital Stay: Payer: 59

## 2018-05-03 ENCOUNTER — Telehealth: Payer: Self-pay

## 2018-05-03 ENCOUNTER — Other Ambulatory Visit: Payer: Self-pay | Admitting: Internal Medicine

## 2018-05-03 ENCOUNTER — Encounter: Payer: Self-pay | Admitting: Internal Medicine

## 2018-05-03 ENCOUNTER — Inpatient Hospital Stay (HOSPITAL_BASED_OUTPATIENT_CLINIC_OR_DEPARTMENT_OTHER): Payer: 59 | Admitting: Internal Medicine

## 2018-05-03 ENCOUNTER — Ambulatory Visit
Admission: RE | Admit: 2018-05-03 | Discharge: 2018-05-03 | Disposition: A | Discharge status: Home / Self Care | Payer: 59 | Source: Ambulatory Visit | Attending: Radiation Oncology | Admitting: Radiation Oncology

## 2018-05-03 VITALS — BP 137/93 | HR 99 | Temp 99.0°F | Resp 20 | Ht 73.0 in | Wt 240.6 lb

## 2018-05-03 VITALS — BP 134/102 | HR 92 | Temp 98.0°F | Resp 17 | Ht 73.0 in | Wt 243.0 lb

## 2018-05-03 VITALS — BP 136/98 | HR 88 | Temp 98.6°F | Resp 18

## 2018-05-03 DIAGNOSIS — G43909 Migraine, unspecified, not intractable, without status migrainosus: Secondary | ICD-10-CM | POA: Diagnosis not present

## 2018-05-03 DIAGNOSIS — C7B8 Other secondary neuroendocrine tumors: Secondary | ICD-10-CM | POA: Diagnosis not present

## 2018-05-03 DIAGNOSIS — K209 Esophagitis, unspecified: Secondary | ICD-10-CM | POA: Diagnosis not present

## 2018-05-03 DIAGNOSIS — Z7901 Long term (current) use of anticoagulants: Secondary | ICD-10-CM | POA: Diagnosis not present

## 2018-05-03 DIAGNOSIS — C787 Secondary malignant neoplasm of liver and intrahepatic bile duct: Secondary | ICD-10-CM | POA: Insufficient documentation

## 2018-05-03 DIAGNOSIS — I10 Essential (primary) hypertension: Secondary | ICD-10-CM

## 2018-05-03 DIAGNOSIS — C7A1 Malignant poorly differentiated neuroendocrine tumors: Secondary | ICD-10-CM

## 2018-05-03 DIAGNOSIS — Z79899 Other long term (current) drug therapy: Secondary | ICD-10-CM | POA: Diagnosis not present

## 2018-05-03 DIAGNOSIS — C3492 Malignant neoplasm of unspecified part of left bronchus or lung: Secondary | ICD-10-CM

## 2018-05-03 DIAGNOSIS — K219 Gastro-esophageal reflux disease without esophagitis: Secondary | ICD-10-CM | POA: Diagnosis not present

## 2018-05-03 DIAGNOSIS — Z5111 Encounter for antineoplastic chemotherapy: Secondary | ICD-10-CM

## 2018-05-03 DIAGNOSIS — Z8249 Family history of ischemic heart disease and other diseases of the circulatory system: Secondary | ICD-10-CM | POA: Insufficient documentation

## 2018-05-03 DIAGNOSIS — F419 Anxiety disorder, unspecified: Secondary | ICD-10-CM | POA: Diagnosis not present

## 2018-05-03 DIAGNOSIS — Z7951 Long term (current) use of inhaled steroids: Secondary | ICD-10-CM | POA: Insufficient documentation

## 2018-05-03 DIAGNOSIS — C3490 Malignant neoplasm of unspecified part of unspecified bronchus or lung: Secondary | ICD-10-CM | POA: Diagnosis present

## 2018-05-03 DIAGNOSIS — C7949 Secondary malignant neoplasm of other parts of nervous system: Secondary | ICD-10-CM

## 2018-05-03 DIAGNOSIS — C7931 Secondary malignant neoplasm of brain: Secondary | ICD-10-CM | POA: Diagnosis not present

## 2018-05-03 DIAGNOSIS — Z96659 Presence of unspecified artificial knee joint: Secondary | ICD-10-CM | POA: Diagnosis not present

## 2018-05-03 DIAGNOSIS — Z08 Encounter for follow-up examination after completed treatment for malignant neoplasm: Secondary | ICD-10-CM | POA: Diagnosis not present

## 2018-05-03 DIAGNOSIS — Z5112 Encounter for antineoplastic immunotherapy: Secondary | ICD-10-CM | POA: Diagnosis not present

## 2018-05-03 LAB — COMPREHENSIVE METABOLIC PANEL
ALT: 25 U/L (ref 0–44)
AST: 22 U/L (ref 15–41)
Albumin: 3.4 g/dL — ABNORMAL LOW (ref 3.5–5.0)
Alkaline Phosphatase: 91 U/L (ref 38–126)
Anion gap: 10 (ref 5–15)
BUN: 9 mg/dL (ref 6–20)
CO2: 26 mmol/L (ref 22–32)
Calcium: 9.5 mg/dL (ref 8.9–10.3)
Chloride: 104 mmol/L (ref 98–111)
Creatinine, Ser: 1.25 mg/dL — ABNORMAL HIGH (ref 0.61–1.24)
GFR calc Af Amer: >60 mL/min (ref >60)
GFR calc non Af Amer: >60 mL/min (ref >60)
Glucose, Bld: 134 mg/dL — ABNORMAL HIGH (ref 70–99)
Potassium: 4.3 mmol/L (ref 3.5–5.1)
Sodium: 140 mmol/L (ref 135–145)
Total Bilirubin: 0.5 mg/dL (ref 0.3–1.2)
Total Protein: 7.1 g/dL (ref 6.5–8.1)

## 2018-05-03 LAB — CBC WITH DIFFERENTIAL/PLATELET
Basophils Absolute: 0 10*3/uL (ref 0.0–0.1)
Basophils Relative: 1 %
Eosinophils Absolute: 0.1 10*3/uL (ref 0.0–0.5)
Eosinophils Relative: 3 %
HCT: 43.4 % (ref 38.4–49.9)
Hemoglobin: 14.8 g/dL (ref 13.0–17.1)
Lymphocytes Relative: 19 %
Lymphs Abs: 0.6 10*3/uL — ABNORMAL LOW (ref 0.9–3.3)
MCH: 39.9 pg — ABNORMAL HIGH (ref 27.2–33.4)
MCHC: 34.1 g/dL (ref 32.0–36.0)
MCV: 117.1 fL — ABNORMAL HIGH (ref 79.3–98.0)
Monocytes Absolute: 0.6 10*3/uL (ref 0.1–0.9)
Monocytes Relative: 20 %
Neutro Abs: 1.8 10*3/uL (ref 1.5–6.5)
Neutrophils Relative %: 57 %
Platelets: 241 10*3/uL (ref 140–400)
RBC: 3.71 MIL/uL — ABNORMAL LOW (ref 4.20–5.82)
RDW: 16 % — ABNORMAL HIGH (ref 11.0–14.6)
WBC: 3.1 10*3/uL — ABNORMAL LOW (ref 4.0–10.3)

## 2018-05-03 MED ORDER — CYANOCOBALAMIN 1000 MCG/ML IJ SOLN
INTRAMUSCULAR | Status: AC
  Filled 2018-05-03: qty 1

## 2018-05-03 MED ORDER — CYANOCOBALAMIN 1000 MCG/ML IJ SOLN
1000.0000 ug | Freq: Once | INTRAMUSCULAR | Status: AC
  Administered 2018-05-03: 1000 ug via INTRAMUSCULAR

## 2018-05-03 MED ORDER — SODIUM CHLORIDE 0.9 % IV SOLN
15.2000 mg/kg | Freq: Once | INTRAVENOUS | Status: AC
  Administered 2018-05-03: 1600 mg via INTRAVENOUS
  Filled 2018-05-03: qty 64

## 2018-05-03 MED ORDER — SODIUM CHLORIDE 0.9 % IV SOLN
510.0000 mg/m2 | Freq: Once | INTRAVENOUS | Status: AC
  Administered 2018-05-03: 1200 mg via INTRAVENOUS
  Filled 2018-05-03: qty 40

## 2018-05-03 MED ORDER — PROCHLORPERAZINE MALEATE 10 MG PO TABS
ORAL_TABLET | ORAL | Status: AC
  Filled 2018-05-03: qty 1

## 2018-05-03 MED ORDER — SODIUM CHLORIDE 0.9 % IV SOLN
Freq: Once | INTRAVENOUS | Status: AC
  Administered 2018-05-03: 12:00:00 via INTRAVENOUS

## 2018-05-03 MED ORDER — PROCHLORPERAZINE MALEATE 10 MG PO TABS
10.0000 mg | ORAL_TABLET | Freq: Once | ORAL | Status: AC
  Administered 2018-05-03: 10 mg via ORAL

## 2018-05-03 NOTE — Telephone Encounter (Signed)
Patient wife called to r/s labs for earlier time. Per 7/23 phone que

## 2018-05-03 NOTE — Progress Notes (Signed)
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 Left lung: 30 Gy in 15 fractions  01/25/17 SRS Treatment: 1. PTV1 Left Frontal 26mm: 20 Gy in 1 fraction   Narrative:  [name] is a pleasant, 52 y.o. gentleman who was diagnosed with stage IV lung cancer in the spring of 2018. He received SRS to a left frontal lesion and he has responded well to this with complete resolution noted on imaging since 09/2017. He has also received radiotherapy to the lung, and has been continued on systemic therapy as well and is currently receiving Avastin/Alimta. He had a recent MRI of the brain on 04/28/18 which did not reveal new or persistent disease. He is due to see Dr. Julien Nordmann today for his next cycle of treatment.   On review of systems, the patient reports that he is doing well overall. He does have anxiety when he comes for treatment and despite taking Ativan 0.5 mg (2) tabs prior to arriving, he is still anxious. He describes anticipatory nausea related to his chemotherapy days. He is also complaining of concerns with a diastasis recti along the midline of the anterior abdominal wall. He feels like this is uncomfortable, and reports a history as well of eosinophilic esophagitis several years ago. Since that time he's used either an H2 blocker or PPI. He currently takes omeprazole. He describes a gnawing abdominal pain, and denies any bowel or bladder dysfunction. He denies any chest pain, shortness of breath, cough, fevers, chills, night sweats, unintended weight changes. He denies  headaches, visual disturbances, or dizziness. He denies bladder disturbances, and denies nausea or vomiting. He denies any new musculoskeletal or joint aches or pains, new skin lesions or concerns. A complete review of systems is obtained and is otherwise negative.       Past Medical History:  Past Medical History:  Diagnosis Date  . Encounter for antineoplastic chemotherapy 12/31/2016  . GERD 11/08/2009   Qualifier: Diagnosis of  By: Ronnald Ramp MD, Arvid Right Goals of care, counseling/discussion 12/31/2016  . History of radiation therapy 01/13/2017 - 02/02/2017   Site/dose:  Left lung: 30 Gy in 15 fractions  . HYPERTENSION 11/08/2009   Qualifier: Diagnosis of  By: Ronnald Ramp MD, Arvid Right.   . 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 Migraine 02/25/2012  . Pneumonia   . PONV (postoperative nausea and vomiting)   . Rotator cuff tear, left     Past Surgical History: Past Surgical History:  Procedure Laterality Date  . INGUINAL HERNIA REPAIR    . SHOULDER ARTHROSCOPY WITH ROTATOR CUFF REPAIR Left 11/12/2016   Procedure: SHOULDER ARTHROSCOPY WITH ROTATOR CUFF REPAIR AND SUBACROMIAL DECOMPRESSION;  Surgeon: Tania Ade, MD;  Location: [facility];  Service: Orthopedics;  Laterality: Left;  SHOULDER ARTHROSCOPY WITH ROTATOR CUFF REPAIR AND SUBACROMIAL DECOMPRESSION  . TONSILLECTOMY    . TOTAL KNEE ARTHROPLASTY    . VIDEO BRONCHOSCOPY Bilateral 12/10/2016   Procedure: VIDEO BRONCHOSCOPY WITHOUT FLUORO;  Surgeon: Tanda Rockers, MD;  Location: [facility];  Service: Cardiopulmonary;  Laterality:  Bilateral;    Social History:  Social History   Socioeconomic History  . Marital status: Married    Spouse name: Baker Janus  . Number of children: 2  . Years of education: Not on file  . Highest education level: Not on file  Occupational History  . Occupation: Psychologist, occupational: [employer]  Social Needs  . Financial resource strain: Not on file  . Food insecurity:     Worry: Not on file    Inability: Not on file  . Transportation needs:    Medical: Not on file    Non-medical: Not on file  Tobacco Use  . Smoking status: Never Smoker  . Smokeless tobacco: Never Used  Substance and Sexual Activity  . Alcohol use: Yes    Comment: social  . Drug use: No  . Sexual activity: Not Currently  Lifestyle  . Physical activity:    Days per week: Not on file    Minutes per session: Not on file  . Stress: Not on file  Relationships  . Social connections:    Talks on phone: Not on file    Gets together: Not on file    Attends religious service: Not on file    Active member of club or organization: Not on file    Attends meetings of clubs or organizations: Not on file    Relationship status: Not on file  . Intimate partner violence:    Fear of current or ex partner: Not on file    Emotionally abused: Not on file    Physically abused: Not on file    Forced sexual activity: Not on file  Other Topics Concern  . Not on file  Social History Narrative   Lives at home wife and 2 kids   Caffeine use: none      Family History: Family History  Problem Relation Age of Onset  . Heart disease Mother 32  . Lung cancer Unknown        family history  . CAD Unknown        strong family history male and male <50  . Mental retardation Unknown   . Heart attack Brother 15  . Alcohol abuse Neg Hx   . Diabetes Neg Hx   . Early death Neg Hx   . Hyperlipidemia Neg Hx   . Hypertension Neg Hx   . Kidney disease Neg Hx   . Stroke Neg Hx   . Migraines Neg Hx                   ALLERGIES:  has No Known Allergies.  Meds: Current Outpatient Medications  Medication Sig Dispense Refill  . amLODipine (NORVASC) 5 MG tablet Take 5 mg by mouth daily.    . budesonide-formoterol (SYMBICORT) 80-4.5 MCG/ACT inhaler Inhale 2 puffs into the lungs 2 (two) times daily. 1 Inhaler 5  . dexamethasone (DECADRON) 4 MG tablet 4 mg by mouth twice a day the day before, day of and day  after the chemotherapy 40 tablet 1  . folic acid (FOLVITE) 1 MG tablet Take 1 tablet (1 mg total) by mouth daily. 30 tablet 4  . furosemide (LASIX) 20 MG tablet TAKE 1 TABLET BY MOUTH EVERY DAY AS NEEDED 20 tablet 0  . LORazepam (ATIVAN) 0.5 MG tablet TAKE 1 TAB BY MOUTH EVERY 4 TO 6 HRS AS NEEDED FOR ANXIETY AND 1 TAB 1HR PRIOR TO RADIATION/MRI 30 tablet 0  . losartan (COZAAR) 50  MG tablet Take 50 mg by mouth daily.    . Multiple Vitamin (MULTIVITAMIN WITH MINERALS) TABS tablet Take 1 tablet by mouth daily.    . ondansetron (ZOFRAN) 8 MG tablet TAKE 1 TABLET BY MOUTH EVERY 8 HOURS AS NEEDED FOR NAUSEA OR VOMITING. 30 tablet 1  . pantoprazole (PROTONIX) 40 MG tablet Take 40 mg by mouth daily.  2  . prochlorperazine (COMPAZINE) 10 MG tablet TAKE 1 TABLET BY MOUTH  EVERY 6 HOURS AS NEEDED FOR NAUSEA OR VOMITING 30 tablet 1  . traMADol (ULTRAM) 50 MG tablet TAKE 1 TABLET BY MOUTH EVERY 12 HOURS AS NEEDED 30 tablet 0  . XARELTO 20 MG TABS tablet TAKE 1 TABLET BY MOUTH EVERY DAY WITH SUPPER 30 tablet 1  . chlorpheniramine-HYDROcodone (TUSSIONEX) 10-8 MG/5ML SUER Take 5 mLs by mouth every 12 (twelve) hours as needed for cough. (Patient not taking: Reported on 03/01/2018) 473 mL 0  . prochlorperazine (COMPAZINE) 10 MG tablet TAKE 1 TABLET BY MOUTH  EVERY 6 HOURS AS NEEDED FOR NAUSEA OR VOMITING (Patient not taking: Reported on 05/03/2018) 30 tablet 0   No current facility-administered medications for this encounter.     Physical Findings:  height is 6\' 1"  (1.854 m) and weight is 240 lb 9.6 oz (109.1 kg). His oral temperature is 99 F (37.2 C). His blood pressure is 137/93 (abnormal) and his pulse is 99. His respiration is 20 and oxygen saturation is 100%.  Pain Assessment Pain Score: 6  Pain Loc: Abdomen/10 In general this is a well appearing Caucasian male in no acute distress. He's alert and oriented x4 and appropriate throughout the examination. Cardiopulmonary assessment is negative for acute  distress and he exhibits normal effort. Skin is intact without any evidence of gross lesions. Abdomen has active bowel sounds in all quadrants and is intact. There is a visible distasis recti noted along the upper midline of his abdomen. It is reducible without distinct hernia. The abdomen is soft, non tender, non distended. Lower extremities are negative for pretibial pitting edema, deep calf tenderness, cyanosis or clubbing.   Lab Findings: Lab Results  Component Value Date   WBC 3.1 (L) 05/03/2018   HGB 14.8 05/03/2018   HCT 43.4 05/03/2018   MCV 117.1 (H) 05/03/2018   PLT 241 05/03/2018     Radiographic Findings: Ct Chest W Contrast  Result Date: 04/08/2018 CLINICAL DATA:  Metastatic non-small cell lung cancer with poorly differentiated high-grade neuroendocrine carcinoma, prior stereotactic radiotherapy of a solitary brain metastatic lesion and chemotherapy. EXAM: CT CHEST, ABDOMEN, AND PELVIS WITH CONTRAST TECHNIQUE: Multidetector CT imaging of the chest, abdomen and pelvis was performed following the standard protocol during bolus administration of intravenous contrast. CONTRAST:  144mL ISOVUE-300 IOPAMIDOL (ISOVUE-300) INJECTION 61% COMPARISON:  01/17/2018 FINDINGS: CT CHEST FINDINGS Cardiovascular: Unremarkable Mediastinum/Nodes: Unremarkable Lungs/Pleura: Left lower lobe nodule 1.2 by 1.0 cm on image 133/4, previously 1.0 by 0.9 cm on 06/08/2016, and previously mildly hypermetabolic. Stable perihilar ground-glass opacities in the left upper lobe and superior segment left lower lobe likely related to interval radiation therapy, without root residual significant mass identified. Nodal tissue interposed between the left lower lobe pulmonary artery and the lingular bronchus measures up to 8 mm in short axis, previously the same. Musculoskeletal: Mild levoconvex upper thoracic scoliosis. CT ABDOMEN PELVIS FINDINGS Hepatobiliary: Suspected diffuse hepatic steatosis. No well-defined focal hepatic  lesion observed. Gallbladder unremarkable. Pancreas: Unremarkable Spleen: Unremarkable Adrenals/Urinary Tract: Adrenal glands normal. Exophytic 1.1 cm hypodense lesion from the right kidney lower  pole is stable and probably a cyst although technically too small to characterize. Stomach/Bowel: Unremarkable Vascular/Lymphatic: Aortoiliac atherosclerotic vascular disease. Reproductive: Chronic mild stranding around the seminal vesicles. Punctate calcifications centrally in the prostate gland. Other: No supplemental non-categorized findings. Musculoskeletal: Lower anterior abdominopelvic ventral hernia mesh. Degenerative disc disease and spondylosis at L5-S1. Facet arthropathy and degenerative disc disease cause suspected mild foraminal impingement bilaterally at L3-4. Small umbilical hernia contains adipose tissue. IMPRESSION: 1. The left lower lobe nodule currently measures 1.2 by 1.0 cm. This has very minimally increased in size over the past 2 years, and measured 1.0 by 0.9 cm in 2017. The lesion has low-grade metabolic activity on a PET-CT from 2018, and could reflect low-grade malignancy. 2. No recurrence along the left perihilar region. Left perihilar ground-glass opacity favoring radiation pneumonitis/fibrosis. 3. Other imaging findings of potential clinical significance: Aortic Atherosclerosis (ICD10-I70.0). Diffuse hepatic steatosis. Lumbar spondylosis and degenerative disc disease. Electronically Signed   By: Van Clines M.D.   On: 04/08/2018 11:35   Mr Jeri Cos JY Contrast  Result Date: 04/28/2018 CLINICAL DATA:  Follow-up metastatic lung cancer post SRS. EXAM: MRI HEAD WITHOUT AND WITH CONTRAST TECHNIQUE: Multiplanar, multiecho pulse sequences of the brain and surrounding structures were obtained without and with intravenous contrast. CONTRAST:  17mL MULTIHANCE GADOBENATE DIMEGLUMINE 529 MG/ML IV SOLN COMPARISON:  MRI 01/14/2018 FINDINGS: Brain: Ventricle size normal. Cerebral volume normal.  Negative for acute infarct. Small white matter hyperintensities in the frontal lobe stable from prior studies. No hemorrhage mass or edema. No enhancing metastatic deposits in the brain. Small cyst in the left inferior frontal lobe from prior treated tumor is unchanged without recurrence. Vascular: Normal arterial flow voids Skull and upper cervical spine: Negative Sinuses/Orbits: Moderate mucosal edema in the paranasal sinuses with progression from the prior MRI. Normal orbit Other: None IMPRESSION: Negative for recurrent metastatic disease in the brain. No change from the prior study Progression of sinus mucosal disease. Electronically Signed   By: Franchot Gallo M.D.   On: 04/28/2018 16:13   Ct Abdomen Pelvis W Contrast  Result Date: 04/08/2018 CLINICAL DATA:  Metastatic non-small cell lung cancer with poorly differentiated high-grade neuroendocrine carcinoma, prior stereotactic radiotherapy of a solitary brain metastatic lesion and chemotherapy. EXAM: CT CHEST, ABDOMEN, AND PELVIS WITH CONTRAST TECHNIQUE: Multidetector CT imaging of the chest, abdomen and pelvis was performed following the standard protocol during bolus administration of intravenous contrast. CONTRAST:  143mL ISOVUE-300 IOPAMIDOL (ISOVUE-300) INJECTION 61% COMPARISON:  01/17/2018 FINDINGS: CT CHEST FINDINGS Cardiovascular: Unremarkable Mediastinum/Nodes: Unremarkable Lungs/Pleura: Left lower lobe nodule 1.2 by 1.0 cm on image 133/4, previously 1.0 by 0.9 cm on 06/08/2016, and previously mildly hypermetabolic. Stable perihilar ground-glass opacities in the left upper lobe and superior segment left lower lobe likely related to interval radiation therapy, without root residual significant mass identified. Nodal tissue interposed between the left lower lobe pulmonary artery and the lingular bronchus measures up to 8 mm in short axis, previously the same. Musculoskeletal: Mild levoconvex upper thoracic scoliosis. CT ABDOMEN PELVIS FINDINGS  Hepatobiliary: Suspected diffuse hepatic steatosis. No well-defined focal hepatic lesion observed. Gallbladder unremarkable. Pancreas: Unremarkable Spleen: Unremarkable Adrenals/Urinary Tract: Adrenal glands normal. Exophytic 1.1 cm hypodense lesion from the right kidney lower pole is stable and probably a cyst although technically too small to characterize. Stomach/Bowel: Unremarkable Vascular/Lymphatic: Aortoiliac atherosclerotic vascular disease. Reproductive: Chronic mild stranding around the seminal vesicles. Punctate calcifications centrally in the prostate gland. Other: No supplemental non-categorized findings. Musculoskeletal: Lower anterior abdominopelvic ventral hernia mesh. Degenerative disc disease  and spondylosis at L5-S1. Facet arthropathy and degenerative disc disease cause suspected mild foraminal impingement bilaterally at L3-4. Small umbilical hernia contains adipose tissue. IMPRESSION: 1. The left lower lobe nodule currently measures 1.2 by 1.0 cm. This has very minimally increased in size over the past 2 years, and measured 1.0 by 0.9 cm in 2017. The lesion has low-grade metabolic activity on a PET-CT from 2018, and could reflect low-grade malignancy. 2. No recurrence along the left perihilar region. Left perihilar ground-glass opacity favoring radiation pneumonitis/fibrosis. 3. Other imaging findings of potential clinical significance: Aortic Atherosclerosis (ICD10-I70.0). Diffuse hepatic steatosis. Lumbar spondylosis and degenerative disc disease. Electronically Signed   By: Van Clines M.D.   On: 04/08/2018 11:35    Impression/Plan: 1. 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 The patient has been doing very well since completing radiotherapy. He will follow up with me in 4 months for repeat MRI of the brain, and with Dr. Julien Nordmann as scheduled for his  Alimta/Avastin infusions and visits. The patient will keep [place] informed of questions or concerns prior to his next visit. 2. GI concerns. I encouraged him to continue his PPI, and to discuss with Dr. Julien Nordmann when he could be seen by GI, and once he notifies me, I'd be happy to follow up by referring him to [place]. He is in aggreement.  3. Anxiety. I also encouraged him to discuss this with Dr. Julien Nordmann has 1 mg of Ativan doesn't seem to alleviate his anxiety and anticipatory nausea.     Carola Rhine, PAC

## 2018-05-03 NOTE — Addendum Note (Signed)
Encounter addended by: Malena Edman, RN on: 05/03/2018 12:42 PM  Actions taken: Charge Capture section accepted

## 2018-05-03 NOTE — Patient Instructions (Signed)
[facility] Discharge Instructions for Patients Receiving Chemotherapy  Today you received the following chemotherapy agents Avastin and alimta  To help prevent nausea and vomiting after your treatment, we encourage you to take your nausea medication as directed   If you develop nausea and vomiting that is not controlled by your nausea medication, call the clinic.   BELOW ARE SYMPTOMS THAT SHOULD BE REPORTED IMMEDIATELY:  *FEVER GREATER THAN 100.5 F  *CHILLS WITH OR WITHOUT FEVER  NAUSEA AND VOMITING THAT IS NOT CONTROLLED WITH YOUR NAUSEA MEDICATION  *UNUSUAL SHORTNESS OF BREATH  *UNUSUAL BRUISING OR BLEEDING  TENDERNESS IN MOUTH AND THROAT WITH OR WITHOUT PRESENCE OF ULCERS  *URINARY PROBLEMS  *BOWEL PROBLEMS  UNUSUAL RASH Items with * indicate a potential emergency and should be followed up as soon as possible.  Feel free to call the clinic should you have any questions or concerns. The clinic phone number is [phone].  Please show the [facility] at check-in to the Emergency Department and triage nurse.

## 2018-05-03 NOTE — Progress Notes (Signed)
[place] [phone]   [phone]  OFFICE PROGRESS NOTE  [name], MD 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  PRIOR THERAPY:  1) Stereotactic radiotherapy to a solitary brain metastasis under the care of Dr. Lisbeth Renshaw on 01/25/2017. 2) Short course of concurrent chemoradiation with weekly carboplatin for AUC of 2 and paclitaxel 45 MG/M2 to the locally advanced disease in the chest. Status post 3 cycles. 3) Systemic chemotherapy with carboplatin for AUC of 5, Alimta 500 MG/M2 and Avastin 15 MG/KG every 3 weeks. First dose of 02/15/2017. Status post 6 cycles. Last dose 06/08/2017 with stable disease.   CURRENT THERAPY: Maintenance systemic chemotherapy with Alimta 500 MG/M2 in addition to Avastin 15 MG/KG every 3 weeks. First dose 07/13/2017.  Status post 14 cycles.  INTERVAL HISTORY: Calvin Wagner 52 y.o. male returns to the clinic today for follow-up visit accompanied by his wife.  The patient is feeling fine today except for the epigastric pain and dry heaves.  He is currently on Zofran for the nausea.  He also has Compazine available on as-needed basis.  He was seen in the past by Dr. Penelope Coop for eosinophilic esophagitis.  He denied having any current fever or chills.  He has no nausea, vomiting, abdominal pain, diarrhea or constipation.  He has no chest pain, shortness breath, cough or hemoptysis.  He continues to tolerate his treatment with maintenance Alimta and Avastin fairly well.  He had MRI of the brain performed recently that was unremarkable for any metastatic disease to the brain.  He is here for evaluation before  starting cycle #15.  MEDICAL HISTORY: Past Medical History:  Diagnosis Date  . Encounter for antineoplastic chemotherapy 12/31/2016  . GERD 11/08/2009   Qualifier: Diagnosis of  By: Ronnald Ramp MD, Arvid Right Goals of care, counseling/discussion 12/31/2016  . History of radiation therapy 01/13/2017 - 02/02/2017   Site/dose:  Left lung: 30 Gy in 15 fractions  . HYPERTENSION 11/08/2009   Qualifier: Diagnosis of  By: Ronnald Ramp MD, Arvid Right.   . 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 Migraine 02/25/2012  . Pneumonia   . PONV (postoperative nausea and vomiting)   . Rotator cuff tear, left     ALLERGIES:  has No Known Allergies.  MEDICATIONS:  Current Outpatient Medications  Medication Sig Dispense Refill  . amLODipine (NORVASC) 5 MG tablet Take 5 mg by mouth daily.    . budesonide-formoterol (SYMBICORT) 80-4.5 MCG/ACT inhaler Inhale 2 puffs into the lungs 2 (two) times daily. 1 Inhaler 5  . chlorpheniramine-HYDROcodone (TUSSIONEX) 10-8 MG/5ML SUER Take 5 mLs by mouth every 12 (twelve) hours as needed for cough. (Patient not taking: Reported on 03/01/2018) 473 mL 0  . dexamethasone (DECADRON) 4 MG tablet 4 mg by mouth twice a day the day before, day of and day after the chemotherapy 40 tablet 1  . folic acid (FOLVITE) 1 MG tablet Take 1 tablet (1 mg total) by mouth daily. 30 tablet 4  . furosemide (LASIX) 20 MG tablet TAKE 1 TABLET BY MOUTH EVERY DAY AS NEEDED 20 tablet 0  . LORazepam (ATIVAN) 0.5 MG tablet TAKE 1 TAB BY MOUTH  EVERY 4 TO 6 HRS AS NEEDED FOR ANXIETY AND 1 TAB 1HR PRIOR TO RADIATION/MRI 30 tablet 0  . losartan (COZAAR) 50 MG tablet Take 50 mg by mouth daily.    . Multiple Vitamin (MULTIVITAMIN WITH MINERALS) TABS tablet Take 1 tablet by mouth daily.    . ondansetron (ZOFRAN) 8 MG tablet TAKE 1 TABLET BY MOUTH EVERY 8 HOURS AS NEEDED FOR NAUSEA OR VOMITING. 30 tablet 1  . pantoprazole (PROTONIX) 40 MG tablet Take 40 mg by mouth daily.  2  . prochlorperazine  (COMPAZINE) 10 MG tablet TAKE 1 TABLET BY MOUTH  EVERY 6 HOURS AS NEEDED FOR NAUSEA OR VOMITING (Patient not taking: Reported on 05/03/2018) 30 tablet 0  . prochlorperazine (COMPAZINE) 10 MG tablet TAKE 1 TABLET BY MOUTH  EVERY 6 HOURS AS NEEDED FOR NAUSEA OR VOMITING 30 tablet 1  . traMADol (ULTRAM) 50 MG tablet TAKE 1 TABLET BY MOUTH EVERY 12 HOURS AS NEEDED 30 tablet 0  . XARELTO 20 MG TABS tablet TAKE 1 TABLET BY MOUTH EVERY DAY WITH SUPPER 30 tablet 1   No current facility-administered medications for this visit.     SURGICAL HISTORY:  Past Surgical History:  Procedure Laterality Date  . INGUINAL HERNIA REPAIR    . SHOULDER ARTHROSCOPY WITH ROTATOR CUFF REPAIR Left 11/12/2016   Procedure: SHOULDER ARTHROSCOPY WITH ROTATOR CUFF REPAIR AND SUBACROMIAL DECOMPRESSION;  Surgeon: Tania Ade, MD;  Location: [facility];  Service: Orthopedics;  Laterality: Left;  SHOULDER ARTHROSCOPY WITH ROTATOR CUFF REPAIR AND SUBACROMIAL DECOMPRESSION  . TONSILLECTOMY    . TOTAL KNEE ARTHROPLASTY    . VIDEO BRONCHOSCOPY Bilateral 12/10/2016   Procedure: VIDEO BRONCHOSCOPY WITHOUT FLUORO;  Surgeon: Tanda Rockers, MD;  Location: [facility];  Service: Cardiopulmonary;  Laterality: Bilateral;    REVIEW OF SYSTEMS:  A comprehensive review of systems was negative except for: Gastrointestinal: positive for dyspepsia and reflux symptoms   PHYSICAL EXAMINATION: General appearance: alert, cooperative and no distress Head: Normocephalic, without obvious abnormality, atraumatic Neck: no adenopathy, no JVD, supple, symmetrical, trachea midline and thyroid not enlarged, symmetric, no tenderness/mass/nodules Lymph nodes: Cervical, supraclavicular, and axillary nodes normal. Resp: clear to auscultation bilaterally Back: symmetric, no curvature. ROM normal. No CVA tenderness. Cardio: regular rate and rhythm, S1, S2 normal, no murmur, click, rub or gallop GI: soft, non-tender; bowel sounds normal; no masses,  no  organomegaly Extremities: edema 1+  ECOG PERFORMANCE STATUS: 1 - Symptomatic but completely ambulatory  Blood pressure (!) 134/102, pulse 92, temperature 98 F (36.7 C), temperature source Oral, resp. rate 17, height 6\' 1"  (1.854 m), weight 243 lb (110.2 kg), SpO2 99 %.  LABORATORY DATA: Lab Results  Component Value Date   WBC 3.1 (L) 05/03/2018   HGB 14.8 05/03/2018   HCT 43.4 05/03/2018   MCV 117.1 (H) 05/03/2018   PLT 241 05/03/2018      Chemistry      Component Value Date/Time   NA 140 05/03/2018 1018   NA 138 10/08/2017 0907   K 4.3 05/03/2018 1018   K 4.1 10/08/2017 0907   CL 104 05/03/2018 1018   CO2 26 05/03/2018 1018   CO2 24 10/08/2017 0907   BUN 9 05/03/2018 1018   BUN 11.7 10/08/2017 0907   CREATININE 1.25 (H) 05/03/2018 1018   CREATININE 1.2 10/08/2017 0907      Component Value Date/Time   CALCIUM 9.5 05/03/2018 1018   CALCIUM 9.5 10/08/2017 0907   ALKPHOS 91 05/03/2018 1018   ALKPHOS 88 10/08/2017  0907   AST 22 05/03/2018 1018   AST 22 10/08/2017 0907   ALT 25 05/03/2018 1018   ALT 24 10/08/2017 0907   BILITOT 0.5 05/03/2018 1018   BILITOT 0.42 10/08/2017 5643       RADIOGRAPHIC STUDIES: Ct Chest W Contrast  Result Date: 04/08/2018 CLINICAL DATA:  Metastatic non-small cell lung cancer with poorly differentiated high-grade neuroendocrine carcinoma, prior stereotactic radiotherapy of a solitary brain metastatic lesion and chemotherapy. EXAM: CT CHEST, ABDOMEN, AND PELVIS WITH CONTRAST TECHNIQUE: Multidetector CT imaging of the chest, abdomen and pelvis was performed following the standard protocol during bolus administration of intravenous contrast. CONTRAST:  17mL ISOVUE-300 IOPAMIDOL (ISOVUE-300) INJECTION 61% COMPARISON:  01/17/2018 FINDINGS: CT CHEST FINDINGS Cardiovascular: Unremarkable Mediastinum/Nodes: Unremarkable Lungs/Pleura: Left lower lobe nodule 1.2 by 1.0 cm on image 133/4, previously 1.0 by 0.9 cm on 06/08/2016, and previously mildly  hypermetabolic. Stable perihilar ground-glass opacities in the left upper lobe and superior segment left lower lobe likely related to interval radiation therapy, without root residual significant mass identified. Nodal tissue interposed between the left lower lobe pulmonary artery and the lingular bronchus measures up to 8 mm in short axis, previously the same. Musculoskeletal: Mild levoconvex upper thoracic scoliosis. CT ABDOMEN PELVIS FINDINGS Hepatobiliary: Suspected diffuse hepatic steatosis. No well-defined focal hepatic lesion observed. Gallbladder unremarkable. Pancreas: Unremarkable Spleen: Unremarkable Adrenals/Urinary Tract: Adrenal glands normal. Exophytic 1.1 cm hypodense lesion from the right kidney lower pole is stable and probably a cyst although technically too small to characterize. Stomach/Bowel: Unremarkable Vascular/Lymphatic: Aortoiliac atherosclerotic vascular disease. Reproductive: Chronic mild stranding around the seminal vesicles. Punctate calcifications centrally in the prostate gland. Other: No supplemental non-categorized findings. Musculoskeletal: Lower anterior abdominopelvic ventral hernia mesh. Degenerative disc disease and spondylosis at L5-S1. Facet arthropathy and degenerative disc disease cause suspected mild foraminal impingement bilaterally at L3-4. Small umbilical hernia contains adipose tissue. IMPRESSION: 1. The left lower lobe nodule currently measures 1.2 by 1.0 cm. This has very minimally increased in size over the past 2 years, and measured 1.0 by 0.9 cm in 2017. The lesion has low-grade metabolic activity on a PET-CT from 2018, and could reflect low-grade malignancy. 2. No recurrence along the left perihilar region. Left perihilar ground-glass opacity favoring radiation pneumonitis/fibrosis. 3. Other imaging findings of potential clinical significance: Aortic Atherosclerosis (ICD10-I70.0). Diffuse hepatic steatosis. Lumbar spondylosis and degenerative disc disease.  Electronically Signed   By: Van Clines M.D.   On: 04/08/2018 11:35   Mr Jeri Cos PI Contrast  Result Date: 04/28/2018 CLINICAL DATA:  Follow-up metastatic lung cancer post SRS. EXAM: MRI HEAD WITHOUT AND WITH CONTRAST TECHNIQUE: Multiplanar, multiecho pulse sequences of the brain and surrounding structures were obtained without and with intravenous contrast. CONTRAST:  11mL MULTIHANCE GADOBENATE DIMEGLUMINE 529 MG/ML IV SOLN COMPARISON:  MRI 01/14/2018 FINDINGS: Brain: Ventricle size normal. Cerebral volume normal. Negative for acute infarct. Small white matter hyperintensities in the frontal lobe stable from prior studies. No hemorrhage mass or edema. No enhancing metastatic deposits in the brain. Small cyst in the left inferior frontal lobe from prior treated tumor is unchanged without recurrence. Vascular: Normal arterial flow voids Skull and upper cervical spine: Negative Sinuses/Orbits: Moderate mucosal edema in the paranasal sinuses with progression from the prior MRI. Normal orbit Other: None IMPRESSION: Negative for recurrent metastatic disease in the brain. No change from the prior study Progression of sinus mucosal disease. Electronically Signed   By: Franchot Gallo M.D.   On: 04/28/2018 16:13   Ct Abdomen Pelvis W Contrast  Result Date: 04/08/2018 CLINICAL DATA:  Metastatic non-small cell lung cancer with poorly differentiated high-grade neuroendocrine carcinoma, prior stereotactic radiotherapy of a solitary brain metastatic lesion and chemotherapy. EXAM: CT CHEST, ABDOMEN, AND PELVIS WITH CONTRAST TECHNIQUE: Multidetector CT imaging of the chest, abdomen and pelvis was performed following the standard protocol during bolus administration of intravenous contrast. CONTRAST:  189mL ISOVUE-300 IOPAMIDOL (ISOVUE-300) INJECTION 61% COMPARISON:  01/17/2018 FINDINGS: CT CHEST FINDINGS Cardiovascular: Unremarkable Mediastinum/Nodes: Unremarkable Lungs/Pleura: Left lower lobe nodule 1.2 by 1.0 cm on  image 133/4, previously 1.0 by 0.9 cm on 06/08/2016, and previously mildly hypermetabolic. Stable perihilar ground-glass opacities in the left upper lobe and superior segment left lower lobe likely related to interval radiation therapy, without root residual significant mass identified. Nodal tissue interposed between the left lower lobe pulmonary artery and the lingular bronchus measures up to 8 mm in short axis, previously the same. Musculoskeletal: Mild levoconvex upper thoracic scoliosis. CT ABDOMEN PELVIS FINDINGS Hepatobiliary: Suspected diffuse hepatic steatosis. No well-defined focal hepatic lesion observed. Gallbladder unremarkable. Pancreas: Unremarkable Spleen: Unremarkable Adrenals/Urinary Tract: Adrenal glands normal. Exophytic 1.1 cm hypodense lesion from the right kidney lower pole is stable and probably a cyst although technically too small to characterize. Stomach/Bowel: Unremarkable Vascular/Lymphatic: Aortoiliac atherosclerotic vascular disease. Reproductive: Chronic mild stranding around the seminal vesicles. Punctate calcifications centrally in the prostate gland. Other: No supplemental non-categorized findings. Musculoskeletal: Lower anterior abdominopelvic ventral hernia mesh. Degenerative disc disease and spondylosis at L5-S1. Facet arthropathy and degenerative disc disease cause suspected mild foraminal impingement bilaterally at L3-4. Small umbilical hernia contains adipose tissue. IMPRESSION: 1. The left lower lobe nodule currently measures 1.2 by 1.0 cm. This has very minimally increased in size over the past 2 years, and measured 1.0 by 0.9 cm in 2017. The lesion has low-grade metabolic activity on a PET-CT from 2018, and could reflect low-grade malignancy. 2. No recurrence along the left perihilar region. Left perihilar ground-glass opacity favoring radiation pneumonitis/fibrosis. 3. Other imaging findings of potential clinical significance: Aortic Atherosclerosis (ICD10-I70.0). Diffuse  hepatic steatosis. Lumbar spondylosis and degenerative disc disease. Electronically Signed   By: Van Clines M.D.   On: 04/08/2018 11:35    ASSESSMENT AND PLAN: This is a very pleasant 52 years old white male with stage IV non-small cell lung cancer, high-grade neuroendocrine carcinoma presented with locally advanced disease in the chest as well as solitary brain metastasis and 2 liver lesions. The patient is status post stereotactic radiotherapy to the solitary brain metastasis as well as short course of concurrent chemoradiation to the locally advanced disease in the chest. He recently completed 6 cycles of systemic chemotherapy with carboplatin, Alimta and Avastin and tolerated this treatment fairly well. The patient is currently on maintenance treatment with Alimta and Avastin status post 14 cycles. The patient continues to tolerate this treatment well except for intermittent nausea.  He is currently on Zofran and Compazine.  He has a history of acid reflux and eosinophilic esophagitis.  I recommended for the patient to see his gastroenterologist for reevaluation. I also recommend for the patient to proceed with cycle #15 today as scheduled. He will come back for follow-up visit in 3 weeks for evaluation before the next cycle of his treatment. He was advised to call immediately if he has any concerning symptoms in the interval. The patient voices understanding of current disease status and treatment options and is in agreement with the current care plan. All questions were answered. The patient knows to call the clinic with any problems, questions  or concerns. We can certainly see the patient much sooner if necessary.  Disclaimer: This note was dictated with voice recognition software. Similar sounding words can inadvertently be transcribed and may not be corrected upon review.

## 2018-05-04 ENCOUNTER — Telehealth: Payer: Self-pay | Admitting: Internal Medicine

## 2018-05-04 NOTE — Telephone Encounter (Signed)
Scheduled appt per 7/23 los - unable to r/s appt from 8/13 to 8/12 - on add on sheet - will call pt when able to r/s

## 2018-05-11 ENCOUNTER — Telehealth: Payer: Self-pay | Admitting: *Deleted

## 2018-05-11 NOTE — Telephone Encounter (Signed)
Pt wife called lmovm states "Jacari has been very tired lately , some nausea no fever. His next treatment is 8/12. I have some question about his treatment. Returned call to wife, unable to reach, unable to leave messages as vm is full.

## 2018-05-12 ENCOUNTER — Other Ambulatory Visit: Payer: Self-pay | Admitting: Internal Medicine

## 2018-05-13 ENCOUNTER — Other Ambulatory Visit: Payer: Self-pay | Admitting: Medical Oncology

## 2018-05-13 ENCOUNTER — Telehealth: Payer: Self-pay | Admitting: Medical Oncology

## 2018-05-13 DIAGNOSIS — C3492 Malignant neoplasm of unspecified part of left bronchus or lung: Secondary | ICD-10-CM

## 2018-05-13 NOTE — Telephone Encounter (Addendum)
"  Calvin Wagner is not good - GI - persistent nausea, never throws up , "cannot stomach anything and takes an hour to get mentally prepared to eat a bowl of cereal." appt with Dr Darrel Hoover 8/6. Double vision- has had for months, but last night was "major. He sees everything on top of each other". Referred back to Dr Katy Fitch. Anxiety- Wants to visit a sick friend but not sure he can make trip based on how bad he feels. Calvin Wagner is worried that his treatment is compromising his heart. He wants to skip next treatment. Requests ativan refill. Per Calvin Wagner may skip next treatment and ok for refill for ativan q day prn anxiety. Referral to [facility] requested-  Done -sent to Dr Lamar Benes.

## 2018-05-18 DIAGNOSIS — R1013 Epigastric pain: Secondary | ICD-10-CM | POA: Diagnosis not present

## 2018-05-18 DIAGNOSIS — K200 Eosinophilic esophagitis: Secondary | ICD-10-CM | POA: Diagnosis not present

## 2018-05-20 ENCOUNTER — Other Ambulatory Visit: Payer: Self-pay | Admitting: *Deleted

## 2018-05-20 ENCOUNTER — Telehealth: Payer: Self-pay | Admitting: *Deleted

## 2018-05-20 DIAGNOSIS — C3492 Malignant neoplasm of unspecified part of left bronchus or lung: Secondary | ICD-10-CM

## 2018-05-20 NOTE — Telephone Encounter (Signed)
Fax confirmation received. 

## 2018-05-20 NOTE — Telephone Encounter (Signed)
Pt wife Calvin Wagner called with the following concerns.  Debra referral coordinator at [facility] informed pts wife they did not have a referral from Dr. Julien Nordmann. Discussed with [facility] referral was sent on 8/2 electronically and Diane faxed referral on 8/2. Claretta Fraise I will refax. Calvin Wagner provided a fax# [phone]. Referral re faxed  to above #.  Calvin Wagner also questioned if pt should go to [facility] for a referral, how many appointments pt may have if he chooses to go to [place] or [place].  Calvin Wagner asked for MD to refer pt to [facility] or if MD had a specific Physician pt should be seen by other than [facility].  Discussed in length with Calvin Wagner, pt and MD should discuss his concerns about 2nd and 3rd opinion at next office visit.   MD has made a referral to [facility] per pt/wife request.   Informed Calvin Wagner it is ultimately up to the pt where he would like to continue care  At other hospitals ([facility]) it's possible there is a research trial that may be offered that we do not have here at [facility]. Again it's a personal choice and pt should address additional concerns directly with MD. No further concerns.

## 2018-05-23 ENCOUNTER — Telehealth: Payer: Self-pay | Admitting: Adult Health

## 2018-05-23 ENCOUNTER — Other Ambulatory Visit: Payer: Self-pay | Admitting: *Deleted

## 2018-05-23 ENCOUNTER — Inpatient Hospital Stay: Payer: 59 | Attending: Internal Medicine

## 2018-05-23 ENCOUNTER — Inpatient Hospital Stay (HOSPITAL_BASED_OUTPATIENT_CLINIC_OR_DEPARTMENT_OTHER): Payer: 59 | Admitting: Adult Health

## 2018-05-23 ENCOUNTER — Encounter: Payer: Self-pay | Admitting: Adult Health

## 2018-05-23 ENCOUNTER — Inpatient Hospital Stay: Payer: 59

## 2018-05-23 VITALS — BP 137/71 | HR 95 | Temp 98.3°F | Resp 18 | Ht 73.0 in | Wt 239.7 lb

## 2018-05-23 DIAGNOSIS — G629 Polyneuropathy, unspecified: Secondary | ICD-10-CM | POA: Insufficient documentation

## 2018-05-23 DIAGNOSIS — C3492 Malignant neoplasm of unspecified part of left bronchus or lung: Secondary | ICD-10-CM

## 2018-05-23 DIAGNOSIS — C7B8 Other secondary neuroendocrine tumors: Secondary | ICD-10-CM

## 2018-05-23 DIAGNOSIS — C7A1 Malignant poorly differentiated neuroendocrine tumors: Secondary | ICD-10-CM

## 2018-05-23 DIAGNOSIS — I10 Essential (primary) hypertension: Secondary | ICD-10-CM

## 2018-05-23 DIAGNOSIS — Z7901 Long term (current) use of anticoagulants: Secondary | ICD-10-CM

## 2018-05-23 LAB — CBC WITH DIFFERENTIAL (CANCER CENTER ONLY)
Basophils Absolute: 0 10*3/uL (ref 0.0–0.1)
Basophils Relative: 0 %
Eosinophils Absolute: 0.1 10*3/uL (ref 0.0–0.5)
Eosinophils Relative: 2 %
HCT: 42 % (ref 38.4–49.9)
Hemoglobin: 14.1 g/dL (ref 13.0–17.1)
Lymphocytes Relative: 18 %
Lymphs Abs: 0.7 10*3/uL — ABNORMAL LOW (ref 0.9–3.3)
MCH: 39 pg — ABNORMAL HIGH (ref 27.2–33.4)
MCHC: 33.6 g/dL (ref 32.0–36.0)
MCV: 116 fL — ABNORMAL HIGH (ref 79.3–98.0)
Monocytes Absolute: 0.6 10*3/uL (ref 0.1–0.9)
Monocytes Relative: 17 %
Neutro Abs: 2.3 10*3/uL (ref 1.5–6.5)
Neutrophils Relative %: 63 %
Platelet Count: 244 10*3/uL (ref 140–400)
RBC: 3.62 MIL/uL — ABNORMAL LOW (ref 4.20–5.82)
RDW: 14.5 % (ref 11.0–14.6)
WBC Count: 3.7 10*3/uL — ABNORMAL LOW (ref 4.0–10.3)

## 2018-05-23 LAB — CMP (CANCER CENTER ONLY)
ALT: 29 U/L (ref 0–44)
AST: 22 U/L (ref 15–41)
Albumin: 3.4 g/dL — ABNORMAL LOW (ref 3.5–5.0)
Alkaline Phosphatase: 88 U/L (ref 38–126)
Anion gap: 9 (ref 5–15)
BUN: 10 mg/dL (ref 6–20)
CO2: 26 mmol/L (ref 22–32)
Calcium: 9.3 mg/dL (ref 8.9–10.3)
Chloride: 102 mmol/L (ref 98–111)
Creatinine: 1.28 mg/dL — ABNORMAL HIGH (ref 0.61–1.24)
GFR, Est AFR Am: >60 mL/min (ref >60)
GFR, Estimated: >60 mL/min (ref >60)
Glucose, Bld: 117 mg/dL — ABNORMAL HIGH (ref 70–99)
Potassium: 4.4 mmol/L (ref 3.5–5.1)
Sodium: 137 mmol/L (ref 135–145)
Total Bilirubin: 0.5 mg/dL (ref 0.3–1.2)
Total Protein: 7.2 g/dL (ref 6.5–8.1)

## 2018-05-23 NOTE — Telephone Encounter (Signed)
Per Lindsey/Dr. Earlie Server referral faxed to [facility] for second opinion for [place] Fax # [phone]

## 2018-05-23 NOTE — Progress Notes (Signed)
[facility] Follow up:    Calvin Pepper, MD Calvin Wagner [address] [id number]   DIAGNOSIS: Cancer Staging No matching staging information was found for the patient.  SUMMARY OF ONCOLOGIC HISTORY: Oncology History   Patient presented with onset of cough then followed with scans.         Cancer ([place]) (Resolved)   11/25/2016 Initial Diagnosis    Cancer ([place])    11/25/2016 Imaging    CTA IMPRESSION: 1. Multiple bilateral acute pulmonary emboli, segmental to subsegmental size. 2. 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 There is complete obstruction of the lingular and left lower lobe airways. 1 cm pulmonary nodule in the left lateral costophrenic sulcus.    11/26/2016 Imaging    CT Abdomen No mass or adenopathy in the abdomen or pelvis    12/10/2016 Surgery    Operation: Video Flexible fiberoptic bronchoscopy, diagnostic     12/17/2016 Imaging    PET IMPRESSION: 1. Extensive left hilar and mediastinal all hypermetabolic lymphadenopathy, compatible with underlying malignancy. These findings could reflect either primary lymphoma or bronchogenic carcinoma such as a small cell carcinoma. 2. There is also a small left lower lobe pulmonary nodule measuring 11 mm which is mildly hypermetabolic. This could potentially represent the primary bronchogenic neoplasm, but it is un usual for a small nodule of this size to result in such extensive lymphadenopathy.    01/02/2017 Imaging    MRI Brain IMPRESSION:  Positive for a solitary 13 mm rim enhancing metastasis in the left anterior inferior frontal gyrus (series 10, image 72. No associated edema or mass effect.    01/04/2017 -  Radiation Therapy    SIM Chest     01/11/2017 -  Chemotherapy    The patient had palonosetron (ALOXI) injection 0.25 mg, 0.25 mg, Intravenous,  Once, 1 of 7 cycles  CARBOplatin (PARAPLATIN) 280 mg in sodium chloride 0.9 % 250  mL chemo infusion, 280 mg (100 % of original dose 281.6 mg), Intravenous,  Once, 1 of 7 cycles Dose modification: 281.6 mg (original dose 281.6 mg, Cycle 1)  PACLitaxel (TAXOL) 102 mg in dextrose 5 % 250 mL chemo infusion ( for chemotherapy treatment.       CURRENT THERAPY: Alimta/Avastin  INTERVAL HISTORY: Calvin Wagner 52 y.o. male returns for evaluation today of his stage IV lung cancer.  He is accompanied by his wife Calvin Wagner. He would like to skip his treatment today since his last treatment he suffered from side effects of nausea, dizziness, inability to eat that lasted almost 3 weeks.  He is just now starting to feel better.  He has also had some intermittent palpitations.  He denies chest pain.  He says he has had double vision since his initial treatment that has worsened with every subsequent treatment.  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  He denies any motor changes.  He has lower extremity swelling as well.    [name] is taking Xarelto daily and is tolerating this well.  He denies any easy bruising or bleeding.  He was referred to Dr. Aniceto Boss at [facility], however the earliest he can be evaluated is end of September.  They would like a referral to [facility] instead, in hopes to get things expedited.    Jasen's dentist would like to do a light dental eval and cleaning.  They want to know if he can do that.  Patient Active Problem List   Diagnosis Date Noted  . Brain metastasis ([place]) 02/17/2017  . Hypersensitivity reaction 01/12/2017  . Long term current use of anticoagulant therapy 01/12/2017  . Large cell carcinoma of left lung, stage 4 ([place]) 12/31/2016  . Goals of care, counseling/discussion 12/31/2016  . Encounter for antineoplastic chemotherapy 12/31/2016  . Obstructive pneumonia 11/25/2016  . Pulmonary embolus ([place]) 11/25/2016  . Bronchial obstruction   . Lung nodule 11/24/2016  . Dyspnea 11/24/2016  . Cough 10/20/2016  . Pulmonary  infiltrates on CXR 10/20/2016  . Asthmatic bronchitis , chronic ([place]) 10/20/2016  . Headache 10/10/2015  . Worsening headaches 10/10/2015  . Vision changes 10/10/2015  . Snoring 10/10/2015  . Uncontrolled morning headache 10/10/2015  . 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 Dizziness 10/10/2015  . Nausea with vomiting 10/10/2015  . Chest pain 07/21/2014  . Alcohol dependence ([place]) 07/21/2014  . Abdominal pain 07/21/2014  . Chest pain, atypical 07/21/2014  . Chest pain at rest 07/21/2014  . Other abnormal glucose 10/26/2012  . Essential hypertension 11/08/2009  . GERD 11/08/2009    has No Known Allergies.  MEDICAL HISTORY: Past Medical History:  Diagnosis Date  . Encounter for antineoplastic chemotherapy 12/31/2016  . GERD 11/08/2009   Qualifier: Diagnosis of  By: Ronnald Ramp MD, Arvid Right Goals of care, counseling/discussion 12/31/2016  . History of radiation therapy 01/13/2017 - 02/02/2017   Site/dose:  Left lung: 30 Gy in 15 fractions  . HYPERTENSION 11/08/2009   Qualifier: Diagnosis of  By: Ronnald Ramp MD, Arvid Right.   . Large cell carcinoma of left lung, stage 4 ([place]) 12/31/2016  . Migraine 02/25/2012  . Pneumonia   . PONV (postoperative nausea and vomiting)   . Rotator cuff tear, left     SURGICAL HISTORY: Past Surgical History:  Procedure Laterality Date  . INGUINAL HERNIA REPAIR    . SHOULDER ARTHROSCOPY WITH ROTATOR CUFF REPAIR Left 11/12/2016   Procedure: SHOULDER ARTHROSCOPY WITH ROTATOR CUFF REPAIR AND SUBACROMIAL DECOMPRESSION;  Surgeon: Tania Ade, MD;  Location: [facility];  Service: Orthopedics;  Laterality: Left;  SHOULDER ARTHROSCOPY WITH ROTATOR CUFF REPAIR AND SUBACROMIAL DECOMPRESSION  . TONSILLECTOMY    . TOTAL KNEE ARTHROPLASTY    . VIDEO BRONCHOSCOPY Bilateral 12/10/2016   Procedure: VIDEO BRONCHOSCOPY WITHOUT FLUORO;  Surgeon: Tanda Rockers, MD;  Location: [facility];  Service: Cardiopulmonary;  Laterality: Bilateral;    SOCIAL HISTORY: Social History    Socioeconomic History  . Marital status: Married    Spouse name: Calvin Wagner  . Number of children: 2  . Years of education: Not on file  . Highest education level: Not on file  Occupational History  . Occupation: Psychologist, occupational: [employer]  Social Needs  . Financial resource strain: Not on file  . Food insecurity:    Worry: Not on file    Inability: Not on file  . Transportation needs:    Medical: Not on file    Non-medical: Not on file  Tobacco Use  . Smoking status: Never Smoker  . Smokeless tobacco: Never Used  Substance and Sexual Activity  . Alcohol use: Yes    Comment: social  . Drug use: No  . Sexual activity: Not Currently  Lifestyle  . Physical activity:    Days per week: Not on file    Minutes per session: Not on file  . Stress: Not on file  Relationships  . Social connections:    Talks on phone: Not  on file    Gets together: Not on file    Attends religious service: Not on file    Active member of club or organization: Not on file    Attends meetings of clubs or organizations: Not on file    Relationship status: Not on file  . Intimate partner violence:    Fear of current or ex partner: Not on file    Emotionally abused: Not on file    Physically abused: Not on file    Forced sexual activity: Not on file  Other Topics Concern  . Not on file  Social History Narrative   Lives at home wife and 2 kids   Caffeine use: none      FAMILY HISTORY: Family History  Problem Relation Age of Onset  . Heart disease Mother 35  . Lung cancer Unknown        family history  . CAD Unknown        strong family history male and male <50  . Mental retardation Unknown   . Heart attack Brother 68  . Alcohol abuse Neg Hx   . Diabetes Neg Hx   . Early death Neg Hx   . Hyperlipidemia Neg Hx   . Hypertension Neg Hx   . Kidney disease Neg Hx   . Stroke Neg Hx   . Migraines Neg Hx     Review of Systems  Constitutional: Positive for appetite  change, fatigue and unexpected weight change (down 4 pounds since last appt in July). Negative for chills, diaphoresis and fever.  HENT:   Negative for hearing loss, sore throat and trouble swallowing.   Eyes: Positive for eye problems (see interval history).  Respiratory: Negative for chest tightness, cough and shortness of breath.   Cardiovascular: Positive for leg swelling and palpitations. Negative for chest pain.  Gastrointestinal: Positive for nausea. Negative for abdominal distention, abdominal pain, constipation, diarrhea and vomiting.  Endocrine: Negative for hot flashes.  Skin: Negative for itching and rash.  Neurological: Positive for dizziness (see interval history) and numbness (see interval history). Negative for extremity weakness.  Hematological: Negative for adenopathy. Does not bruise/bleed easily.      PHYSICAL EXAMINATION  ECOG PERFORMANCE STATUS: 2 - Symptomatic, <50% confined to bed  Vitals:   05/23/18 1205  BP: 137/71  Pulse: 95  Resp: 18  Temp: 98.3 F (36.8 C)  SpO2: 100%    Physical Exam  Constitutional: He is oriented to person, place, and time. He appears well-developed and well-nourished.  HENT:  Head: Normocephalic and atraumatic.  Mouth/Throat: Oropharynx is clear and moist. No oropharyngeal exudate.  Eyes: Pupils are equal, round, and reactive to light. No scleral icterus.  Neck: Neck supple.  Cardiovascular: Normal rate, regular rhythm and normal heart sounds.  Pulmonary/Chest: Effort normal and breath sounds normal.  Abdominal: Soft. Bowel sounds are normal. He exhibits no distension. There is no tenderness. There is no guarding.  Musculoskeletal: Normal range of motion. He exhibits edema (1+ bilateral lower ext).  Lymphadenopathy:    He has no cervical adenopathy.  Neurological: He is alert and oriented to person, place, and time.  Skin: Skin is warm and dry. Capillary refill takes less than 2 seconds.  Psychiatric: He has a normal mood and  affect.    LABORATORY DATA:  CBC    Component Value Date/Time   WBC 3.7 (L) 05/23/2018 1159   WBC 3.1 (L) 05/03/2018 1018   RBC 3.62 (L) 05/23/2018 1159   HGB 14.1 05/23/2018  1159   HGB 14.4 10/08/2017 0907   HCT 42.0 05/23/2018 1159   HCT 43.6 10/08/2017 0907   PLT 244 05/23/2018 1159   PLT 198 10/08/2017 0907   MCV 116.0 (H) 05/23/2018 1159   MCV 112.7 (H) 10/08/2017 0907   MCH 39.0 (H) 05/23/2018 1159   MCHC 33.6 05/23/2018 1159   RDW 14.5 05/23/2018 1159   RDW 14.2 10/08/2017 0907   LYMPHSABS 0.7 (L) 05/23/2018 1159   LYMPHSABS 0.8 (L) 10/08/2017 0907   MONOABS 0.6 05/23/2018 1159   MONOABS 0.6 10/08/2017 0907   EOSABS 0.1 05/23/2018 1159   EOSABS 0.2 10/08/2017 0907   BASOSABS 0.0 05/23/2018 1159   BASOSABS 0.0 10/08/2017 0907    CMP     Component Value Date/Time   NA 137 05/23/2018 1159   NA 138 10/08/2017 0907   K 4.4 05/23/2018 1159   K 4.1 10/08/2017 0907   CL 102 05/23/2018 1159   CO2 26 05/23/2018 1159   CO2 24 10/08/2017 0907   GLUCOSE 117 (H) 05/23/2018 1159   GLUCOSE 138 10/08/2017 0907   BUN 10 05/23/2018 1159   BUN 11.7 10/08/2017 0907   CREATININE 1.28 (H) 05/23/2018 1159   CREATININE 1.2 10/08/2017 0907   CALCIUM 9.3 05/23/2018 1159   CALCIUM 9.5 10/08/2017 0907   PROT 7.2 05/23/2018 1159   PROT 7.5 10/08/2017 0907   ALBUMIN 3.4 (L) 05/23/2018 1159   ALBUMIN 3.6 10/08/2017 0907   AST 22 05/23/2018 1159   AST 22 10/08/2017 0907   ALT 29 05/23/2018 1159   ALT 24 10/08/2017 0907   ALKPHOS 88 05/23/2018 1159   ALKPHOS 88 10/08/2017 0907   BILITOT 0.5 05/23/2018 1159   BILITOT 0.42 10/08/2017 0907   GFRNONAA >60 05/23/2018 1159   GFRAA >60 05/23/2018 1159       PENDING LABS:   RADIOGRAPHIC STUDIES:  No results found.   PATHOLOGY:     ASSESSMENT and THERAPY PLAN:   Large cell carcinoma of left lung, stage 4 (HCC) This is a very pleasant 52 years old white male with stage IV non-small cell lung cancer, high-grade  neuroendocrine carcinoma presented with locally advanced disease in the chest as well as solitary brain metastasis and 2 liver lesions. The has undergone stereotactic radiotherapy to the solitary brain metastasis as well as short course of concurrent chemoradiation to the locally advanced disease in the chest.  He then completed 6 cycles of systemic chemotherapy with carboplatin, Alimta and Avastin and tolerated this treatment fairly well.   Hasaan is currently receiving maintenance treatment with Alimta and Avastin.  He has had increasing difficulty with tolerating this treatment regimen.  Due to this, we will hold his treatment today.  I reviewed his case in detail with is oncologist, Dr. Julien Nordmann.  I recommended referral to Dr. Ardyth Man. Aundra Dubin.  Patient declined, and requested referral to [facility] for further evaluation in everything.  Dr. Julien Nordmann has recommended Dr. Mindi Junker, and a referral was placed.  He was cleared to undergo dental evaluation during the next couple of weeks while he is skipping his treatment.  He was recommended to hold Xarelto two days prior to the dental evaluation.    Giankarlo will tentatively return on 9/3 for labs, f/u with Dr. Julien Nordmann and his treatment.  They will let us know if they get in with Dr. Mindi Junker sooner, and opt for treatment and f/u at [facility] This Encounter  Procedures  . CBC with Differential [facility]  Only)    Standing Status:   Standing    Number of Occurrences:   52    Standing Expiration Date:   05/25/2019  . CMP ([place] only)    Standing Status:   Standing    Number of Occurrences:   52    Standing Expiration Date:   05/25/2019  . Total Protein, Urine dipstick    Standing Status:   Standing    Number of Occurrences:   52    Standing Expiration Date:   05/25/2019    All questions were answered. The patient knows to call the clinic with any problems, questions or concerns. We can certainly see the patient much sooner if  necessary.  A total of (50) minutes of face-to-face time was spent with this patient with greater than 50% of that time in counseling and care-coordination.  This note was electronically signed. Scot Dock, NP 05/24/2018

## 2018-05-24 ENCOUNTER — Ambulatory Visit: Payer: 59 | Admitting: Internal Medicine

## 2018-05-24 ENCOUNTER — Telehealth: Payer: Self-pay | Admitting: Adult Health

## 2018-05-24 ENCOUNTER — Other Ambulatory Visit: Payer: 59

## 2018-05-24 ENCOUNTER — Ambulatory Visit: Payer: 59

## 2018-05-24 NOTE — Telephone Encounter (Signed)
Per 8/12 no los

## 2018-05-24 NOTE — Telephone Encounter (Signed)
Wife called about referral - I called her back, LVM  and said it was sent yesterday and may be a few days or next week when they call with an appointment.

## 2018-05-24 NOTE — Assessment & Plan Note (Signed)
This is a very pleasant 52 years old white male with stage IV non-small cell lung cancer, high-grade neuroendocrine carcinoma presented with locally advanced disease in the chest as well as solitary brain metastasis and 2 liver lesions. The has undergone stereotactic radiotherapy to the solitary brain metastasis as well as short course of concurrent chemoradiation to the locally advanced disease in the chest.  He then completed 6 cycles of systemic chemotherapy with carboplatin, Alimta and Avastin and tolerated this treatment fairly well.   Calvin Wagner is currently receiving maintenance treatment with Alimta and Avastin.  He has had increasing difficulty with tolerating this treatment regimen.  Due to this, we will hold his treatment today.  I reviewed his case in detail with is oncologist, Dr. Julien Nordmann.  I recommended referral to Dr. Ardyth Man. Calvin Wagner.  Patient declined, and requested referral to [facility] for further evaluation in everything.  Dr. Julien Nordmann has recommended Dr. Mindi Junker, and a referral was placed.  He was cleared to undergo dental evaluation during the next couple of weeks while he is skipping his treatment.  He was recommended to hold Xarelto two days prior to the dental evaluation.    Calvin Wagner will tentatively return on 9/3 for labs, f/u with Dr. Julien Nordmann and his treatment.  They will let us know if they get in with Dr. Mindi Junker sooner, and opt for treatment and f/u at [facility].

## 2018-05-25 ENCOUNTER — Other Ambulatory Visit: Payer: Self-pay | Admitting: Internal Medicine

## 2018-05-25 DIAGNOSIS — I2609 Other pulmonary embolism with acute cor pulmonale: Secondary | ICD-10-CM

## 2018-05-25 DIAGNOSIS — I2782 Chronic pulmonary embolism: Principal | ICD-10-CM

## 2018-05-25 NOTE — Telephone Encounter (Signed)
Pt wife called request for Xerelto refill sent to [facility]. 90 day supply is required per pt insurance. Medication called into [facility] on [address] and [address]

## 2018-05-30 ENCOUNTER — Other Ambulatory Visit: Payer: Self-pay | Admitting: Internal Medicine

## 2018-05-31 ENCOUNTER — Telehealth: Payer: Self-pay | Admitting: Medical Oncology

## 2018-05-31 ENCOUNTER — Other Ambulatory Visit: Payer: Self-pay | Admitting: Internal Medicine

## 2018-05-31 ENCOUNTER — Other Ambulatory Visit: Payer: Self-pay | Admitting: Medical Oncology

## 2018-05-31 DIAGNOSIS — C3492 Malignant neoplasm of unspecified part of left bronchus or lung: Secondary | ICD-10-CM

## 2018-05-31 DIAGNOSIS — F419 Anxiety disorder, unspecified: Secondary | ICD-10-CM

## 2018-05-31 MED ORDER — LORAZEPAM 0.5 MG PO TABS: 0.5000 mg | ORAL_TABLET | Freq: Every day | ORAL | 0 refills | Status: DC | PRN

## 2018-05-31 NOTE — Telephone Encounter (Addendum)
Ativan Refill requested-called to pharmacy

## 2018-05-31 NOTE — Telephone Encounter (Signed)
Referrals - Pt has appt at [facility] on Sept 2nd. She requests referral to [facility]. Wife stated we need to call in referral. I called [facility] and made referral. Records fax[facility] to [facility].

## 2018-06-03 ENCOUNTER — Telehealth: Payer: Self-pay | Admitting: Internal Medicine

## 2018-06-03 NOTE — Telephone Encounter (Signed)
MM PAL - moved 9/3 appointments to 9/6. Left message for patient. Schedule mailed.

## 2018-06-08 DIAGNOSIS — C3490 Malignant neoplasm of unspecified part of unspecified bronchus or lung: Secondary | ICD-10-CM | POA: Diagnosis not present

## 2018-06-08 DIAGNOSIS — C3432 Malignant neoplasm of lower lobe, left bronchus or lung: Secondary | ICD-10-CM | POA: Insufficient documentation

## 2018-06-09 ENCOUNTER — Telehealth: Payer: Self-pay | Admitting: *Deleted

## 2018-06-09 NOTE — Telephone Encounter (Signed)
Pt wife called [facility] regarding concerns for upcoming appt. Returned call to [facility], discussed lab/Provider and infusion appt moved to Friday 9/6. Wife had additional concerns requested to s/w scheduling. Call transferred per request.

## 2018-06-10 ENCOUNTER — Telehealth: Payer: Self-pay | Admitting: Internal Medicine

## 2018-06-10 NOTE — Telephone Encounter (Signed)
Returned call to patient wife re message left about 9/3 appointments being moved to 9/6. Per wife she needs to accompany patient, will  be out of town, and needs appointments moved back to 9/3 or 9/4.  MM will now be in the office for a short period of time the morning of 9/3. Lab/fu/tx moved from 9/6 back to 9/3 @ 8:30 am. Left message for wife at both the number she left [phone]) and the cell number listed in Epic. Patient also my chart active.

## 2018-06-14 ENCOUNTER — Ambulatory Visit: Payer: 59

## 2018-06-14 ENCOUNTER — Encounter: Payer: Self-pay | Admitting: Internal Medicine

## 2018-06-14 ENCOUNTER — Inpatient Hospital Stay: Payer: 59

## 2018-06-14 ENCOUNTER — Inpatient Hospital Stay (HOSPITAL_BASED_OUTPATIENT_CLINIC_OR_DEPARTMENT_OTHER): Payer: 59 | Admitting: Internal Medicine

## 2018-06-14 ENCOUNTER — Other Ambulatory Visit: Payer: 59

## 2018-06-14 ENCOUNTER — Telehealth: Payer: Self-pay | Admitting: Internal Medicine

## 2018-06-14 ENCOUNTER — Inpatient Hospital Stay: Payer: 59 | Attending: Internal Medicine

## 2018-06-14 ENCOUNTER — Ambulatory Visit: Payer: 59 | Admitting: Internal Medicine

## 2018-06-14 VITALS — BP 149/92 | HR 104 | Temp 98.6°F | Resp 14 | Ht 73.0 in | Wt 243.6 lb

## 2018-06-14 DIAGNOSIS — R002 Palpitations: Secondary | ICD-10-CM | POA: Insufficient documentation

## 2018-06-14 DIAGNOSIS — Z923 Personal history of irradiation: Secondary | ICD-10-CM | POA: Insufficient documentation

## 2018-06-14 DIAGNOSIS — C7B8 Other secondary neuroendocrine tumors: Secondary | ICD-10-CM | POA: Diagnosis not present

## 2018-06-14 DIAGNOSIS — Z9221 Personal history of antineoplastic chemotherapy: Secondary | ICD-10-CM | POA: Insufficient documentation

## 2018-06-14 DIAGNOSIS — K219 Gastro-esophageal reflux disease without esophagitis: Secondary | ICD-10-CM | POA: Diagnosis not present

## 2018-06-14 DIAGNOSIS — C7A1 Malignant poorly differentiated neuroendocrine tumors: Secondary | ICD-10-CM | POA: Insufficient documentation

## 2018-06-14 DIAGNOSIS — I10 Essential (primary) hypertension: Secondary | ICD-10-CM

## 2018-06-14 DIAGNOSIS — C3492 Malignant neoplasm of unspecified part of left bronchus or lung: Secondary | ICD-10-CM

## 2018-06-14 DIAGNOSIS — Z5111 Encounter for antineoplastic chemotherapy: Secondary | ICD-10-CM

## 2018-06-14 DIAGNOSIS — C7931 Secondary malignant neoplasm of brain: Secondary | ICD-10-CM

## 2018-06-14 LAB — CBC WITH DIFFERENTIAL (CANCER CENTER ONLY)
Basophils Absolute: 0 10*3/uL (ref 0.0–0.1)
Basophils Relative: 1 %
Eosinophils Absolute: 0.1 10*3/uL (ref 0.0–0.5)
Eosinophils Relative: 4 %
HCT: 44.5 % (ref 38.4–49.9)
Hemoglobin: 15.1 g/dL (ref 13.0–17.1)
Lymphocytes Relative: 21 %
Lymphs Abs: 0.8 10*3/uL — ABNORMAL LOW (ref 0.9–3.3)
MCH: 38.7 pg — ABNORMAL HIGH (ref 27.2–33.4)
MCHC: 33.8 g/dL (ref 32.0–36.0)
MCV: 114.4 fL — ABNORMAL HIGH (ref 79.3–98.0)
Monocytes Absolute: 0.5 10*3/uL (ref 0.1–0.9)
Monocytes Relative: 14 %
Neutro Abs: 2.2 10*3/uL (ref 1.5–6.5)
Neutrophils Relative %: 60 %
Platelet Count: 167 10*3/uL (ref 140–400)
RBC: 3.89 MIL/uL — ABNORMAL LOW (ref 4.20–5.82)
RDW: 14.8 % — ABNORMAL HIGH (ref 11.0–14.6)
WBC Count: 3.7 10*3/uL — ABNORMAL LOW (ref 4.0–10.3)

## 2018-06-14 LAB — CMP (CANCER CENTER ONLY)
ALT: 17 U/L (ref 0–44)
AST: 19 U/L (ref 15–41)
Albumin: 3.5 g/dL (ref 3.5–5.0)
Alkaline Phosphatase: 73 U/L (ref 38–126)
Anion gap: 11 (ref 5–15)
BUN: 11 mg/dL (ref 6–20)
CO2: 24 mmol/L (ref 22–32)
Calcium: 9.4 mg/dL (ref 8.9–10.3)
Chloride: 102 mmol/L (ref 98–111)
Creatinine: 1.18 mg/dL (ref 0.61–1.24)
GFR, Est AFR Am: >60 mL/min (ref >60)
GFR, Estimated: >60 mL/min (ref >60)
Glucose, Bld: 161 mg/dL — ABNORMAL HIGH (ref 70–99)
Potassium: 4 mmol/L (ref 3.5–5.1)
Sodium: 137 mmol/L (ref 135–145)
Total Bilirubin: 0.6 mg/dL (ref 0.3–1.2)
Total Protein: 6.9 g/dL (ref 6.5–8.1)

## 2018-06-14 LAB — TOTAL PROTEIN, URINE DIPSTICK: Protein, ur: NEGATIVE mg/dL

## 2018-06-14 MED ORDER — PROCHLORPERAZINE MALEATE 10 MG PO TABS
10.0000 mg | ORAL_TABLET | Freq: Once | ORAL | Status: AC
  Administered 2018-06-14: 10 mg via ORAL

## 2018-06-14 MED ORDER — PROCHLORPERAZINE MALEATE 10 MG PO TABS
ORAL_TABLET | ORAL | Status: AC
  Filled 2018-06-14: qty 1

## 2018-06-14 MED ORDER — SODIUM CHLORIDE 0.9 % IV SOLN
Freq: Once | INTRAVENOUS | Status: AC
  Administered 2018-06-14: 10:00:00 via INTRAVENOUS
  Filled 2018-06-14: qty 250

## 2018-06-14 MED ORDER — SODIUM CHLORIDE 0.9 % IV SOLN
510.0000 mg/m2 | Freq: Once | INTRAVENOUS | Status: AC
  Administered 2018-06-14: 1200 mg via INTRAVENOUS
  Filled 2018-06-14: qty 40

## 2018-06-14 NOTE — Progress Notes (Signed)
[place] Telephone:(336) [phone]   Fax:(336) [phone]  OFFICE PROGRESS NOTE  [name], MD 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  PRIOR THERAPY:  1) Stereotactic radiotherapy to a solitary brain metastasis under the care of Dr. Lisbeth Renshaw on 01/25/2017. 2) Short course of concurrent chemoradiation with weekly carboplatin for AUC of 2 and paclitaxel 45 MG/M2 to the locally advanced disease in the chest. Status post 3 cycles. 3) Systemic chemotherapy with carboplatin for AUC of 5, Alimta 500 MG/M2 and Avastin 15 MG/KG every 3 weeks. First dose of 02/15/2017. Status post 6 cycles. Last dose 06/08/2017 with stable disease.   CURRENT THERAPY: Maintenance systemic chemotherapy with Alimta 500 MG/M2 in addition to Avastin 15 MG/KG every 3 weeks. First dose 07/13/2017.  Status post 15 cycles.  Starting from cycle #16 he will be treated with single agent Alimta only secondary to intolerance.  INTERVAL HISTORY: Calvin Wagner 52 y.o. male returns to the clinic today for follow-up visit.  The patient is feeling fine today except for the persistent fatigue as well as intermittent nausea.  He mentioned that his vision is better.  He continues to have palpitations.  He denied having any chest pain, shortness of breath, cough or hemoptysis.  He denied having any fever or chills.  He has no diarrhea or constipation.  He has no weight loss or night sweats.  He was seen by a second opinion at [facility] and they are in agreement with the current plan and suggested continuation with single agent Alimta as a test to see if he will tolerate this regimen better  than the combination of Alimta and Avastin.  He is here today for evaluation before starting the first dose of this treatment.  MEDICAL HISTORY: Past Medical History:  Diagnosis Date  . Encounter for antineoplastic chemotherapy 12/31/2016  . GERD 11/08/2009   Qualifier: Diagnosis of  By: Ronnald Ramp MD, Arvid Right Goals of care, counseling/discussion 12/31/2016  . History of radiation therapy 01/13/2017 - 02/02/2017   Site/dose:  Left lung: 30 Gy in 15 fractions  . HYPERTENSION 11/08/2009   Qualifier: Diagnosis of  By: Ronnald Ramp MD, Arvid Right.   . 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 Migraine 02/25/2012  . Pneumonia   . PONV (postoperative nausea and vomiting)   . Rotator cuff tear, left     ALLERGIES:  has No Known Allergies.  MEDICATIONS:  Current Outpatient Medications  Medication Sig Dispense Refill  . amLODipine (NORVASC) 5 MG tablet Take 5 mg by mouth daily.    . budesonide (RA BUDESONIDE) 32 MCG/ACT nasal spray Place into both nostrils daily.    . BUDESONIDE PO Take by mouth. Budesonide slurry    . budesonide-formoterol (SYMBICORT) 80-4.5 MCG/ACT inhaler Inhale 2 puffs into the lungs 2 (two) times daily. 1 Inhaler 5  . chlorpheniramine-HYDROcodone (TUSSIONEX) 10-8 MG/5ML SUER Take 5 mLs by mouth every 12 (twelve) hours as needed for cough. 473 mL 0  . dexamethasone (DECADRON) 4 MG tablet 4 mg by mouth twice a day the day before, day of and day after the chemotherapy 40 tablet 1  . folic acid (FOLVITE) 1 MG  tablet Take 1 tablet (1 mg total) by mouth daily. 30 tablet 4  . furosemide (LASIX) 20 MG tablet TAKE 1 TABLET BY MOUTH EVERY DAY AS NEEDED 20 tablet 0  . LORazepam (ATIVAN) 0.5 MG tablet Take 1 tablet (0.5 mg total) by mouth daily as needed for anxiety (anxiety). 30 tablet 0  . losartan (COZAAR) 50 MG tablet Take 50 mg by mouth daily.    . Multiple Vitamin (MULTIVITAMIN WITH MINERALS) TABS tablet Take 1 tablet by mouth daily.    . ondansetron (ZOFRAN) 8 MG  tablet TAKE 1 TABLET BY MOUTH EVERY 8 HOURS AS NEEDED FOR NAUSEA OR VOMITING. 30 tablet 1  . pantoprazole (PROTONIX) 40 MG tablet Take 40 mg by mouth daily.  2  . prochlorperazine (COMPAZINE) 10 MG tablet TAKE 1 TABLET BY MOUTH  EVERY 6 HOURS AS NEEDED FOR NAUSEA OR VOMITING 30 tablet 0  . prochlorperazine (COMPAZINE) 10 MG tablet TAKE 1 TABLET BY MOUTH  EVERY 6 HOURS AS NEEDED FOR NAUSEA OR VOMITING 30 tablet 1  . traMADol (ULTRAM) 50 MG tablet TAKE 1 TABLET BY MOUTH EVERY 12 HOURS AS NEEDED 30 tablet 0  . XARELTO 20 MG TABS tablet TAKE 1 TABLET BY MOUTH EVERY DAY WITH SUPPER 90 tablet 0   No current facility-administered medications for this visit.     SURGICAL HISTORY:  Past Surgical History:  Procedure Laterality Date  . INGUINAL HERNIA REPAIR    . SHOULDER ARTHROSCOPY WITH ROTATOR CUFF REPAIR Left 11/12/2016   Procedure: SHOULDER ARTHROSCOPY WITH ROTATOR CUFF REPAIR AND SUBACROMIAL DECOMPRESSION;  Surgeon: Tania Ade, MD;  Location: [facility];  Service: Orthopedics;  Laterality: Left;  SHOULDER ARTHROSCOPY WITH ROTATOR CUFF REPAIR AND SUBACROMIAL DECOMPRESSION  . TONSILLECTOMY    . TOTAL KNEE ARTHROPLASTY    . VIDEO BRONCHOSCOPY Bilateral 12/10/2016   Procedure: VIDEO BRONCHOSCOPY WITHOUT FLUORO;  Surgeon: Tanda Rockers, MD;  Location: [facility];  Service: Cardiopulmonary;  Laterality: Bilateral;    REVIEW OF SYSTEMS:  A comprehensive review of systems was negative except for: Constitutional: positive for fatigue Gastrointestinal: positive for dyspepsia, nausea and reflux symptoms   PHYSICAL EXAMINATION: General appearance: alert, cooperative and no distress Head: Normocephalic, without obvious abnormality, atraumatic Neck: no adenopathy, no JVD, supple, symmetrical, trachea midline and thyroid not enlarged, symmetric, no tenderness/mass/nodules Lymph nodes: Cervical, supraclavicular, and axillary nodes normal. Resp: clear to auscultation bilaterally Back: symmetric, no curvature.  ROM normal. No CVA tenderness. Cardio: regular rate and rhythm, S1, S2 normal, no murmur, click, rub or gallop GI: soft, non-tender; bowel sounds normal; no masses,  no organomegaly Extremities: extremities normal, atraumatic, no cyanosis or edema  ECOG PERFORMANCE STATUS: 1 - Symptomatic but completely ambulatory  Blood pressure (!) 149/92, pulse (!) 104, temperature 98.6 F (37 C), temperature source Oral, resp. rate 14, height 6\' 1"  (1.854 m), weight 243 lb 9.6 oz (110.5 kg), SpO2 99 %.  LABORATORY DATA: Lab Results  Component Value Date   WBC 3.7 (L) 05/23/2018   HGB 14.1 05/23/2018   HCT 42.0 05/23/2018   MCV 116.0 (H) 05/23/2018   PLT 244 05/23/2018      Chemistry      Component Value Date/Time   NA 137 05/23/2018 1159   NA 138 10/08/2017 0907   K 4.4 05/23/2018 1159   K 4.1 10/08/2017 0907   CL 102 05/23/2018 1159   CO2 26 05/23/2018 1159   CO2 24 10/08/2017 0907   BUN 10 05/23/2018 1159   BUN 11.7 10/08/2017 1062  CREATININE 1.28 (H) 05/23/2018 1159   CREATININE 1.2 10/08/2017 0907      Component Value Date/Time   CALCIUM 9.3 05/23/2018 1159   CALCIUM 9.5 10/08/2017 0907   ALKPHOS 88 05/23/2018 1159   ALKPHOS 88 10/08/2017 0907   AST 22 05/23/2018 1159   AST 22 10/08/2017 0907   ALT 29 05/23/2018 1159   ALT 24 10/08/2017 0907   BILITOT 0.5 05/23/2018 1159   BILITOT 0.42 10/08/2017 0907       RADIOGRAPHIC STUDIES: No results found.  ASSESSMENT AND PLAN: This is a very pleasant 52 years old white male with stage IV non-small cell lung cancer, high-grade neuroendocrine carcinoma presented with locally advanced disease in the chest as well as solitary brain metastasis and 2 liver lesions. The patient is status post stereotactic radiotherapy to the solitary brain metastasis as well as short course of concurrent chemoradiation to the locally advanced disease in the chest. He recently completed 6 cycles of systemic chemotherapy with carboplatin, Alimta and  Avastin and tolerated this treatment fairly well. The patient is currently on maintenance treatment with Alimta and Avastin status post 15 cycles. The patient has a rough time tolerating this combination maintenance therapy.  He was seen for a second opinion at [facility] and the suggested proceeding with 1 of the 2 drugs only mainly Alimta. We will proceed with the first dose of this treatment today.  I will continue to hold Avastin for now. For the intermittent nausea and dyspepsia, he was seen by Dr. Penelope Coop from gastroenterology. For the palpitation, I recommended for the patient to get referral from his primary care physician to see a cardiologist for evaluation of his condition. He will come back for follow-up visit in 3 weeks for evaluation before starting cycle #17. The patient voices understanding of current disease status and treatment options and is in agreement with the current care plan. All questions were answered. The patient knows to call the clinic with any problems, questions or concerns. We can certainly see the patient much sooner if necessary.  Disclaimer: This note was dictated with voice recognition software. Similar sounding words can inadvertently be transcribed and may not be corrected upon review.

## 2018-06-14 NOTE — Telephone Encounter (Signed)
Scheduled appt per 9/3 los - pt to get an updated schedule next visit.  

## 2018-06-14 NOTE — Patient Instructions (Addendum)
[facility] Discharge Instructions for Patients Receiving Chemotherapy  Today you received the following chemotherapy agents Alimta  To help prevent nausea and vomiting after your treatment, we encourage you to take your nausea medication as directed   If you develop nausea and vomiting that is not controlled by your nausea medication, call the clinic.   BELOW ARE SYMPTOMS THAT SHOULD BE REPORTED IMMEDIATELY:  *FEVER GREATER THAN 100.5 F  *CHILLS WITH OR WITHOUT FEVER  NAUSEA AND VOMITING THAT IS NOT CONTROLLED WITH YOUR NAUSEA MEDICATION  *UNUSUAL SHORTNESS OF BREATH  *UNUSUAL BRUISING OR BLEEDING  TENDERNESS IN MOUTH AND THROAT WITH OR WITHOUT PRESENCE OF ULCERS  *URINARY PROBLEMS  *BOWEL PROBLEMS  UNUSUAL RASH Items with * indicate a potential emergency and should be followed up as soon as possible.  Feel free to call the clinic should you have any questions or concerns. The clinic phone number is [phone].  Please show the [facility] at check-in to the Emergency Department and triage nurse.

## 2018-06-14 NOTE — Addendum Note (Signed)
Addended by: Ardeen Garland on: 06/14/2018 10:22 AM   Modules accepted: Orders

## 2018-06-17 ENCOUNTER — Other Ambulatory Visit: Payer: 59

## 2018-06-17 ENCOUNTER — Ambulatory Visit: Payer: 59

## 2018-06-17 ENCOUNTER — Ambulatory Visit: Payer: 59 | Admitting: Oncology

## 2018-06-20 DIAGNOSIS — C3402 Malignant neoplasm of left main bronchus: Secondary | ICD-10-CM | POA: Diagnosis not present

## 2018-06-27 DIAGNOSIS — K200 Eosinophilic esophagitis: Secondary | ICD-10-CM | POA: Diagnosis not present

## 2018-07-05 ENCOUNTER — Inpatient Hospital Stay: Payer: 59

## 2018-07-05 ENCOUNTER — Inpatient Hospital Stay (HOSPITAL_BASED_OUTPATIENT_CLINIC_OR_DEPARTMENT_OTHER): Payer: 59 | Admitting: Internal Medicine

## 2018-07-05 ENCOUNTER — Encounter: Payer: Self-pay | Admitting: Internal Medicine

## 2018-07-05 ENCOUNTER — Telehealth: Payer: Self-pay | Admitting: Internal Medicine

## 2018-07-05 VITALS — BP 160/100 | HR 100 | Temp 98.1°F | Resp 18 | Ht 73.0 in | Wt 242.1 lb

## 2018-07-05 DIAGNOSIS — C3492 Malignant neoplasm of unspecified part of left bronchus or lung: Secondary | ICD-10-CM

## 2018-07-05 DIAGNOSIS — I10 Essential (primary) hypertension: Secondary | ICD-10-CM | POA: Diagnosis not present

## 2018-07-05 DIAGNOSIS — Z9221 Personal history of antineoplastic chemotherapy: Secondary | ICD-10-CM

## 2018-07-05 DIAGNOSIS — C7B8 Other secondary neuroendocrine tumors: Secondary | ICD-10-CM

## 2018-07-05 DIAGNOSIS — Z5111 Encounter for antineoplastic chemotherapy: Secondary | ICD-10-CM

## 2018-07-05 DIAGNOSIS — C7A1 Malignant poorly differentiated neuroendocrine tumors: Secondary | ICD-10-CM | POA: Diagnosis not present

## 2018-07-05 DIAGNOSIS — C7931 Secondary malignant neoplasm of brain: Secondary | ICD-10-CM

## 2018-07-05 DIAGNOSIS — Z923 Personal history of irradiation: Secondary | ICD-10-CM | POA: Diagnosis not present

## 2018-07-05 DIAGNOSIS — R112 Nausea with vomiting, unspecified: Secondary | ICD-10-CM

## 2018-07-05 DIAGNOSIS — C3490 Malignant neoplasm of unspecified part of unspecified bronchus or lung: Secondary | ICD-10-CM

## 2018-07-05 LAB — CBC WITH DIFFERENTIAL (CANCER CENTER ONLY)
Basophils Absolute: 0 10*3/uL (ref 0.0–0.1)
Basophils Relative: 1 %
Eosinophils Absolute: 0 10*3/uL (ref 0.0–0.5)
Eosinophils Relative: 1 %
HCT: 46 % (ref 38.4–49.9)
Hemoglobin: 15.4 g/dL (ref 13.0–17.1)
Lymphocytes Relative: 16 %
Lymphs Abs: 0.6 10*3/uL — ABNORMAL LOW (ref 0.9–3.3)
MCH: 38 pg — ABNORMAL HIGH (ref 27.2–33.4)
MCHC: 33.4 g/dL (ref 32.0–36.0)
MCV: 113.6 fL — ABNORMAL HIGH (ref 79.3–98.0)
Monocytes Absolute: 0.5 10*3/uL (ref 0.1–0.9)
Monocytes Relative: 14 %
Neutro Abs: 2.5 10*3/uL (ref 1.5–6.5)
Neutrophils Relative %: 68 %
Platelet Count: 206 10*3/uL (ref 140–400)
RBC: 4.05 MIL/uL — ABNORMAL LOW (ref 4.20–5.82)
RDW: 14.8 % — ABNORMAL HIGH (ref 11.0–14.6)
WBC Count: 3.7 10*3/uL — ABNORMAL LOW (ref 4.0–10.3)

## 2018-07-05 LAB — CMP (CANCER CENTER ONLY)
ALT: 23 U/L (ref 0–44)
AST: 23 U/L (ref 15–41)
Albumin: 3.6 g/dL (ref 3.5–5.0)
Alkaline Phosphatase: 85 U/L (ref 38–126)
Anion gap: 12 (ref 5–15)
BUN: 9 mg/dL (ref 6–20)
CO2: 23 mmol/L (ref 22–32)
Calcium: 9.2 mg/dL (ref 8.9–10.3)
Chloride: 103 mmol/L (ref 98–111)
Creatinine: 1.21 mg/dL (ref 0.61–1.24)
GFR, Est AFR Am: >60 mL/min (ref >60)
GFR, Estimated: >60 mL/min (ref >60)
Glucose, Bld: 178 mg/dL — ABNORMAL HIGH (ref 70–99)
Potassium: 3.8 mmol/L (ref 3.5–5.1)
Sodium: 138 mmol/L (ref 135–145)
Total Bilirubin: 0.4 mg/dL (ref 0.3–1.2)
Total Protein: 7.4 g/dL (ref 6.5–8.1)

## 2018-07-05 LAB — TOTAL PROTEIN, URINE DIPSTICK: Protein, ur: <30 mg/dL — AB

## 2018-07-05 MED ORDER — PROCHLORPERAZINE MALEATE 10 MG PO TABS
ORAL_TABLET | ORAL | Status: AC
  Filled 2018-07-05: qty 1

## 2018-07-05 MED ORDER — PROCHLORPERAZINE MALEATE 10 MG PO TABS
10.0000 mg | ORAL_TABLET | Freq: Once | ORAL | Status: AC
  Administered 2018-07-05: 10 mg via ORAL

## 2018-07-05 MED ORDER — SODIUM CHLORIDE 0.9 % IV SOLN
Freq: Once | INTRAVENOUS | Status: AC
  Administered 2018-07-05: 12:00:00 via INTRAVENOUS
  Filled 2018-07-05: qty 250

## 2018-07-05 MED ORDER — SODIUM CHLORIDE 0.9 % IV SOLN
1200.0000 mg | Freq: Once | INTRAVENOUS | Status: AC
  Administered 2018-07-05: 1200 mg via INTRAVENOUS
  Filled 2018-07-05: qty 40

## 2018-07-05 MED ORDER — CYANOCOBALAMIN 1000 MCG/ML IJ SOLN
INTRAMUSCULAR | Status: AC
  Filled 2018-07-05: qty 1

## 2018-07-05 MED ORDER — CYANOCOBALAMIN 1000 MCG/ML IJ SOLN
1000.0000 ug | Freq: Once | INTRAMUSCULAR | Status: AC
  Administered 2018-07-05: 1000 ug via INTRAMUSCULAR

## 2018-07-05 NOTE — Patient Instructions (Signed)
[facility] Discharge Instructions for Patients Receiving Chemotherapy  Today you received the following chemotherapy agents Alimta  To help prevent nausea and vomiting after your treatment, we encourage you to take your nausea medication as directed   If you develop nausea and vomiting that is not controlled by your nausea medication, call the clinic.   BELOW ARE SYMPTOMS THAT SHOULD BE REPORTED IMMEDIATELY:  *FEVER GREATER THAN 100.5 F  *CHILLS WITH OR WITHOUT FEVER  NAUSEA AND VOMITING THAT IS NOT CONTROLLED WITH YOUR NAUSEA MEDICATION  *UNUSUAL SHORTNESS OF BREATH  *UNUSUAL BRUISING OR BLEEDING  TENDERNESS IN MOUTH AND THROAT WITH OR WITHOUT PRESENCE OF ULCERS  *URINARY PROBLEMS  *BOWEL PROBLEMS  UNUSUAL RASH Items with * indicate a potential emergency and should be followed up as soon as possible.  Feel free to call the clinic should you have any questions or concerns. The clinic phone number is [phone].  Please show the [facility] at check-in to the Emergency Department and triage nurse.

## 2018-07-05 NOTE — Progress Notes (Signed)
[name] Telephone:([phone]) [phone]   Fax:(336) [phone]  OFFICE PROGRESS NOTE  [name], MD 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  PRIOR THERAPY:  1) Stereotactic radiotherapy to a solitary brain metastasis under the care of Dr. Lisbeth Renshaw on 01/25/2017. 2) Short course of concurrent chemoradiation with weekly carboplatin for AUC of 2 and paclitaxel 45 MG/M2 to the locally advanced disease in the chest. Status post 3 cycles. 3) Systemic chemotherapy with carboplatin for AUC of 5, Alimta 500 MG/M2 and Avastin 15 MG/KG every 3 weeks. First dose of 02/15/2017. Status post 6 cycles. Last dose 06/08/2017 with stable disease.   CURRENT THERAPY: Maintenance systemic chemotherapy with Alimta 500 MG/M2 in addition to Avastin 15 MG/KG every 3 weeks. First dose 07/13/2017.  Status post 16 cycles.  Starting from cycle #16 he will be treated with single agent Alimta only secondary to intolerance.  INTERVAL HISTORY: Calvin Wagner 52 y.o. male returns to the clinic today for follow-up visit accompanied by his wife.  The patient is feeling fine today except for the generalized fatigue and intermittent nausea.  He is using Zofran as well as Compazine as needed.  He denied having any significant fever or chills.  He has no chest pain, shortness of breath, cough or hemoptysis.  He tolerated the last cycle of his maintenance treatment with Alimta well.  He is here today for evaluation before starting cycle #17.  MEDICAL HISTORY: Past Medical History:  Diagnosis Date  . Encounter for antineoplastic chemotherapy 12/31/2016  . GERD 11/08/2009   Qualifier: Diagnosis of   By: Ronnald Ramp MD, Arvid Right Goals of care, counseling/discussion 12/31/2016  . History of radiation therapy 01/13/2017 - 02/02/2017   Site/dose:  Left lung: 30 Gy in 15 fractions  . HYPERTENSION 11/08/2009   Qualifier: Diagnosis of  By: Ronnald Ramp MD, Arvid Right.   . Large cell carcinoma of left lung, stage 4 ([place]) 12/31/2016  . Migraine 02/25/2012  . Pneumonia   . PONV (postoperative nausea and vomiting)   . Rotator cuff tear, left     ALLERGIES:  has No Known Allergies.  MEDICATIONS:  Current Outpatient Medications  Medication Sig Dispense Refill  . albuterol (PROVENTIL HFA;VENTOLIN HFA) 108 (90 Base) MCG/ACT inhaler Inhale 2 puffs into the lungs every 4 (four) hours as needed.    Marland Kitchen amLODipine (NORVASC) 5 MG tablet Take 5 mg by mouth daily.    . budesonide (RA BUDESONIDE) 32 MCG/ACT nasal spray Place into both nostrils daily.    . BUDESONIDE PO Take by mouth. Budesonide slurry    . budesonide-formoterol (SYMBICORT) 80-4.5 MCG/ACT inhaler Inhale 2 puffs into the lungs 2 (two) times daily. 1 Inhaler 5  . chlorpheniramine-HYDROcodone (TUSSIONEX) 10-8 MG/5ML SUER Take 5 mLs by mouth every 12 (twelve) hours as needed for cough. (Patient not taking: Reported on 06/14/2018) 473 mL 0  . dexamethasone (DECADRON) 4 MG tablet 4 mg by mouth twice a day the day before, day of and day after the chemotherapy 40 tablet 1  . folic acid (FOLVITE) 1 MG tablet Take 1 tablet (1 mg total) by mouth daily. 30 tablet 4  . furosemide (LASIX) 20 MG tablet TAKE 1 TABLET BY  MOUTH EVERY DAY AS NEEDED 20 tablet 0  . LORazepam (ATIVAN) 0.5 MG tablet Take 1 tablet (0.5 mg total) by mouth daily as needed for anxiety (anxiety). 30 tablet 0  . losartan (COZAAR) 50 MG tablet Take 50 mg by mouth daily.    . Multiple Vitamin (MULTIVITAMIN WITH MINERALS) TABS tablet Take 1 tablet by mouth daily.    . ondansetron (ZOFRAN) 8 MG tablet TAKE 1 TABLET BY MOUTH EVERY 8 HOURS AS NEEDED FOR NAUSEA OR VOMITING. 30 tablet 1  . pantoprazole  (PROTONIX) 40 MG tablet Take 40 mg by mouth daily.  2  . PROAIR HFA 108 (90 Base) MCG/ACT inhaler Inhale 2 puffs into the lungs every 4 (four) hours as needed.  5  . prochlorperazine (COMPAZINE) 10 MG tablet TAKE 1 TABLET BY MOUTH  EVERY 6 HOURS AS NEEDED FOR NAUSEA OR VOMITING (Patient not taking: Reported on 06/14/2018) 30 tablet 0  . traMADol (ULTRAM) 50 MG tablet TAKE 1 TABLET BY MOUTH EVERY 12 HOURS AS NEEDED 30 tablet 0  . XARELTO 20 MG TABS tablet TAKE 1 TABLET BY MOUTH EVERY DAY WITH SUPPER 90 tablet 0   No current facility-administered medications for this visit.     SURGICAL HISTORY:  Past Surgical History:  Procedure Laterality Date  . INGUINAL HERNIA REPAIR    . SHOULDER ARTHROSCOPY WITH ROTATOR CUFF REPAIR Left 11/12/2016   Procedure: SHOULDER ARTHROSCOPY WITH ROTATOR CUFF REPAIR AND SUBACROMIAL DECOMPRESSION;  Surgeon: Tania Ade, MD;  Location: [facility];  Service: Orthopedics;  Laterality: Left;  SHOULDER ARTHROSCOPY WITH ROTATOR CUFF REPAIR AND SUBACROMIAL DECOMPRESSION  . TONSILLECTOMY    . TOTAL KNEE ARTHROPLASTY    . VIDEO BRONCHOSCOPY Bilateral 12/10/2016   Procedure: VIDEO BRONCHOSCOPY WITHOUT FLUORO;  Surgeon: Tanda Rockers, MD;  Location: [facility];  Service: Cardiopulmonary;  Laterality: Bilateral;    REVIEW OF SYSTEMS:  A comprehensive review of systems was negative except for: Constitutional: positive for fatigue Gastrointestinal: positive for nausea   PHYSICAL EXAMINATION: General appearance: alert, cooperative and no distress Head: Normocephalic, without obvious abnormality, atraumatic Neck: no adenopathy, no JVD, supple, symmetrical, trachea midline and thyroid not enlarged, symmetric, no tenderness/mass/nodules Lymph nodes: Cervical, supraclavicular, and axillary nodes normal. Resp: clear to auscultation bilaterally Back: symmetric, no curvature. ROM normal. No CVA tenderness. Cardio: regular rate and rhythm, S1, S2 normal, no murmur, click, rub or  gallop GI: soft, non-tender; bowel sounds normal; no masses,  no organomegaly Extremities: extremities normal, atraumatic, no cyanosis or edema  ECOG PERFORMANCE STATUS: 1 - Symptomatic but completely ambulatory  Blood pressure (!) 160/100, pulse 100, temperature 98.1 F (36.7 C), temperature source Oral, resp. rate 18, height 6\' 1"  (1.854 m), weight 242 lb 1.6 oz (109.8 kg), SpO2 100 %.  LABORATORY DATA: Lab Results  Component Value Date   WBC 3.7 (L) 07/05/2018   HGB 15.4 07/05/2018   HCT 46.0 07/05/2018   MCV 113.6 (H) 07/05/2018   PLT 206 07/05/2018      Chemistry      Component Value Date/Time   NA 137 06/14/2018 0852   NA 138 10/08/2017 0907   K 4.0 06/14/2018 0852   K 4.1 10/08/2017 0907   CL 102 06/14/2018 0852   CO2 24 06/14/2018 0852   CO2 24 10/08/2017 0907   BUN 11 06/14/2018 0852   BUN 11.7 10/08/2017 0907   CREATININE 1.18 06/14/2018 0852   CREATININE 1.2 10/08/2017 0907      Component Value Date/Time   CALCIUM 9.4 06/14/2018  9144   CALCIUM 9.5 10/08/2017 0907   ALKPHOS 73 06/14/2018 0852   ALKPHOS 88 10/08/2017 0907   AST 19 06/14/2018 0852   AST 22 10/08/2017 0907   ALT 17 06/14/2018 0852   ALT 24 10/08/2017 0907   BILITOT 0.6 06/14/2018 0852   BILITOT 0.42 10/08/2017 0907       RADIOGRAPHIC STUDIES: No results found.  ASSESSMENT AND PLAN: This is a very pleasant 52 years old white male with stage IV non-small cell lung cancer, high-grade neuroendocrine carcinoma presented with locally advanced disease in the chest as well as solitary brain metastasis and 2 liver lesions. The patient is status post stereotactic radiotherapy to the solitary brain metastasis as well as short course of concurrent chemoradiation to the locally advanced disease in the chest. He recently completed 6 cycles of systemic chemotherapy with carboplatin, Alimta and Avastin and tolerated this treatment fairly well. He was started on maintenance treatment with Alimta and  Avastin status post 15 cycles.  Avastin was discontinued secondary to intolerance to the combination treatment. The patient is currently on single agent Alimta every 3 weeks.  I recommended for the patient to proceed with cycle #17 today as a schedule. I will see him back for follow-up visit in 3 weeks for evaluation after repeating CT scan of the chest, abdomen and pelvis for restaging of his disease. For hypertension he was advised to continue with his blood pressure medications and to monitor it closely. He was advised to call immediately if he has any concerning symptoms in the interval. The patient voices understanding of current disease status and treatment options and is in agreement with the current care plan. All questions were answered. The patient knows to call the clinic with any problems, questions or concerns. We can certainly see the patient much sooner if necessary.  Disclaimer: This note was dictated with voice recognition software. Similar sounding words can inadvertently be transcribed and may not be corrected upon review.

## 2018-07-05 NOTE — Telephone Encounter (Signed)
3 cycles already scheduled per 9/24 los . - no additional appts added.

## 2018-07-07 ENCOUNTER — Other Ambulatory Visit: Payer: Self-pay | Admitting: Internal Medicine

## 2018-07-07 ENCOUNTER — Telehealth: Payer: Self-pay | Admitting: Medical Oncology

## 2018-07-07 ENCOUNTER — Other Ambulatory Visit: Payer: Self-pay | Admitting: Medical Oncology

## 2018-07-07 ENCOUNTER — Other Ambulatory Visit: Payer: Self-pay | Admitting: Oncology

## 2018-07-07 DIAGNOSIS — C7931 Secondary malignant neoplasm of brain: Secondary | ICD-10-CM

## 2018-07-07 DIAGNOSIS — Z5111 Encounter for antineoplastic chemotherapy: Secondary | ICD-10-CM

## 2018-07-07 DIAGNOSIS — C3492 Malignant neoplasm of unspecified part of left bronchus or lung: Secondary | ICD-10-CM

## 2018-07-07 MED ORDER — HYDROCOD POLST-CPM POLST ER 10-8 MG/5ML PO SUER: 5.0000 mL | Freq: Two times a day (BID) | ORAL | 0 refills | Status: DC | PRN

## 2018-07-07 NOTE — Telephone Encounter (Signed)
Refill Tussionex

## 2018-07-18 ENCOUNTER — Other Ambulatory Visit: Payer: Self-pay | Admitting: Internal Medicine

## 2018-07-18 DIAGNOSIS — I2782 Chronic pulmonary embolism: Principal | ICD-10-CM

## 2018-07-18 DIAGNOSIS — I2609 Other pulmonary embolism with acute cor pulmonale: Secondary | ICD-10-CM

## 2018-07-22 ENCOUNTER — Ambulatory Visit (HOSPITAL_COMMUNITY)
Admission: RE | Admit: 2018-07-22 | Discharge: 2018-07-22 | Disposition: A | Discharge status: Home / Self Care | Payer: 59 | Source: Ambulatory Visit | Attending: Internal Medicine | Admitting: Internal Medicine

## 2018-07-22 DIAGNOSIS — R911 Solitary pulmonary nodule: Secondary | ICD-10-CM | POA: Diagnosis not present

## 2018-07-22 DIAGNOSIS — C3490 Malignant neoplasm of unspecified part of unspecified bronchus or lung: Secondary | ICD-10-CM | POA: Insufficient documentation

## 2018-07-22 MED ORDER — SODIUM CHLORIDE 0.9 % IJ SOLN
INTRAMUSCULAR | Status: AC
  Filled 2018-07-22: qty 50

## 2018-07-22 MED ORDER — IOHEXOL 300 MG/ML  SOLN
100.0000 mL | Freq: Once | INTRAMUSCULAR | Status: AC | PRN
  Administered 2018-07-22: 100 mL via INTRAVENOUS

## 2018-07-26 ENCOUNTER — Encounter: Payer: Self-pay | Admitting: Internal Medicine

## 2018-07-26 ENCOUNTER — Inpatient Hospital Stay: Payer: 59 | Attending: Internal Medicine

## 2018-07-26 ENCOUNTER — Inpatient Hospital Stay: Payer: 59

## 2018-07-26 ENCOUNTER — Other Ambulatory Visit: Payer: Self-pay | Admitting: Internal Medicine

## 2018-07-26 ENCOUNTER — Inpatient Hospital Stay (HOSPITAL_BASED_OUTPATIENT_CLINIC_OR_DEPARTMENT_OTHER): Payer: 59 | Admitting: Internal Medicine

## 2018-07-26 ENCOUNTER — Telehealth: Payer: Self-pay | Admitting: Internal Medicine

## 2018-07-26 VITALS — HR 99

## 2018-07-26 VITALS — BP 153/94 | HR 112 | Temp 98.1°F | Resp 18 | Ht 73.0 in | Wt 242.7 lb

## 2018-07-26 DIAGNOSIS — C7B02 Secondary carcinoid tumors of liver: Secondary | ICD-10-CM

## 2018-07-26 DIAGNOSIS — C7A019 Malignant carcinoid tumor of the small intestine, unspecified portion: Secondary | ICD-10-CM

## 2018-07-26 DIAGNOSIS — C7931 Secondary malignant neoplasm of brain: Secondary | ICD-10-CM

## 2018-07-26 DIAGNOSIS — C3492 Malignant neoplasm of unspecified part of left bronchus or lung: Secondary | ICD-10-CM

## 2018-07-26 DIAGNOSIS — C7A1 Malignant poorly differentiated neuroendocrine tumors: Secondary | ICD-10-CM | POA: Diagnosis not present

## 2018-07-26 DIAGNOSIS — I10 Essential (primary) hypertension: Secondary | ICD-10-CM | POA: Diagnosis not present

## 2018-07-26 DIAGNOSIS — Z5111 Encounter for antineoplastic chemotherapy: Secondary | ICD-10-CM | POA: Insufficient documentation

## 2018-07-26 DIAGNOSIS — C7B8 Other secondary neuroendocrine tumors: Secondary | ICD-10-CM | POA: Diagnosis not present

## 2018-07-26 DIAGNOSIS — R112 Nausea with vomiting, unspecified: Secondary | ICD-10-CM

## 2018-07-26 DIAGNOSIS — J40 Bronchitis, not specified as acute or chronic: Secondary | ICD-10-CM

## 2018-07-26 LAB — CMP (CANCER CENTER ONLY)
ALT: 21 U/L (ref 0–44)
AST: 21 U/L (ref 15–41)
Albumin: 3.6 g/dL (ref 3.5–5.0)
Alkaline Phosphatase: 93 U/L (ref 38–126)
Anion gap: 12 (ref 5–15)
BUN: 11 mg/dL (ref 6–20)
CO2: 20 mmol/L — ABNORMAL LOW (ref 22–32)
Calcium: 9.5 mg/dL (ref 8.9–10.3)
Chloride: 103 mmol/L (ref 98–111)
Creatinine: 1.45 mg/dL — ABNORMAL HIGH (ref 0.61–1.24)
GFR, Est AFR Am: >60 mL/min (ref >60)
GFR, Estimated: 54 mL/min — ABNORMAL LOW (ref >60)
Glucose, Bld: 256 mg/dL — ABNORMAL HIGH (ref 70–99)
Potassium: 4.6 mmol/L (ref 3.5–5.1)
Sodium: 135 mmol/L (ref 135–145)
Total Bilirubin: 0.6 mg/dL (ref 0.3–1.2)
Total Protein: 7.8 g/dL (ref 6.5–8.1)

## 2018-07-26 LAB — CBC WITH DIFFERENTIAL (CANCER CENTER ONLY)
Abs Immature Granulocytes: 0.01 10*3/uL (ref 0.00–0.07)
Basophils Absolute: 0 10*3/uL (ref 0.0–0.1)
Basophils Relative: 0 %
Eosinophils Absolute: 0 10*3/uL (ref 0.0–0.5)
Eosinophils Relative: 0 %
HCT: 44 % (ref 39.0–52.0)
Hemoglobin: 14.9 g/dL (ref 13.0–17.0)
Immature Granulocytes: 0 %
Lymphocytes Relative: 6 %
Lymphs Abs: 0.3 10*3/uL — ABNORMAL LOW (ref 0.7–4.0)
MCH: 37.8 pg — ABNORMAL HIGH (ref 26.0–34.0)
MCHC: 33.9 g/dL (ref 30.0–36.0)
MCV: 111.7 fL — ABNORMAL HIGH (ref 80.0–100.0)
Monocytes Absolute: 0.1 10*3/uL (ref 0.1–1.0)
Monocytes Relative: 2 %
Neutro Abs: 3.7 10*3/uL (ref 1.7–7.7)
Neutrophils Relative %: 92 %
Platelet Count: 243 10*3/uL (ref 150–400)
RBC: 3.94 MIL/uL — ABNORMAL LOW (ref 4.22–5.81)
RDW: 14.9 % (ref 11.5–15.5)
WBC Count: 4 10*3/uL (ref 4.0–10.5)
nRBC: 0 % (ref 0.0–0.2)

## 2018-07-26 MED ORDER — SODIUM CHLORIDE 0.9 % IV SOLN
510.0000 mg/m2 | Freq: Once | INTRAVENOUS | Status: AC
  Administered 2018-07-26: 1200 mg via INTRAVENOUS
  Filled 2018-07-26: qty 48

## 2018-07-26 MED ORDER — PROCHLORPERAZINE MALEATE 10 MG PO TABS
ORAL_TABLET | ORAL | Status: AC
  Filled 2018-07-26: qty 1

## 2018-07-26 MED ORDER — AZITHROMYCIN 250 MG PO TABS: ORAL_TABLET | ORAL | 0 refills | Status: DC

## 2018-07-26 MED ORDER — PROCHLORPERAZINE MALEATE 10 MG PO TABS
10.0000 mg | ORAL_TABLET | Freq: Once | ORAL | Status: AC
  Administered 2018-07-26: 10 mg via ORAL

## 2018-07-26 MED ORDER — SODIUM CHLORIDE 0.9 % IV SOLN
Freq: Once | INTRAVENOUS | Status: AC
  Administered 2018-07-26: 12:00:00 via INTRAVENOUS
  Filled 2018-07-26: qty 250

## 2018-07-26 NOTE — Patient Instructions (Signed)
[facility] Discharge Instructions for Patients Receiving Chemotherapy  Today you received the following chemotherapy agents Alimta  To help prevent nausea and vomiting after your treatment, we encourage you to take your nausea medication as directed   If you develop nausea and vomiting that is not controlled by your nausea medication, call the clinic.   BELOW ARE SYMPTOMS THAT SHOULD BE REPORTED IMMEDIATELY:  *FEVER GREATER THAN 100.5 F  *CHILLS WITH OR WITHOUT FEVER  NAUSEA AND VOMITING THAT IS NOT CONTROLLED WITH YOUR NAUSEA MEDICATION  *UNUSUAL SHORTNESS OF BREATH  *UNUSUAL BRUISING OR BLEEDING  TENDERNESS IN MOUTH AND THROAT WITH OR WITHOUT PRESENCE OF ULCERS  *URINARY PROBLEMS  *BOWEL PROBLEMS  UNUSUAL RASH Items with * indicate a potential emergency and should be followed up as soon as possible.  Feel free to call the clinic should you have any questions or concerns. The clinic phone number is [phone].  Please show the [facility] at check-in to the Emergency Department and triage nurse.

## 2018-07-26 NOTE — Progress Notes (Signed)
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  PRIOR THERAPY:  1) Stereotactic radiotherapy to a solitary brain metastasis under the care of Dr. Lisbeth Renshaw on 01/25/2017. 2) Short course of concurrent chemoradiation with weekly carboplatin for AUC of 2 and paclitaxel 45 MG/M2 to the locally advanced disease in the chest. Status post 3 cycles. 3) Systemic chemotherapy with carboplatin for AUC of 5, Alimta 500 MG/M2 and Avastin 15 MG/KG every 3 weeks. First dose of 02/15/2017. Status post 6 cycles. Last dose 06/08/2017 with stable disease.   CURRENT THERAPY: Maintenance systemic chemotherapy with Alimta 500 MG/M2 in addition to Avastin 15 MG/KG every 3 weeks. First dose 07/13/2017.  Status post 17 cycles.  Starting from cycle #16 he will be treated with single agent Alimta only secondary to intolerance.  INTERVAL HISTORY: Calvin Wagner 52 y.o. male returns to the clinic today for follow-up visit accompanied by his wife.  The patient is feeling fine today with no specific complaints except for chest congestion, sore throat and cough productive of yellowish sputum.  He denied having any shortness of breath.  He continues to have few episodes of intermittent nausea with no vomiting.  He denied having any recent weight loss or night sweats.  He has no fever or chills.  The patient denied having any headache or visual changes.  He had repeat CT scan of the chest, abdomen and pelvis performed recently and he is here for evaluation and discussion of his discuss  results.  MEDICAL HISTORY: Past Medical History:  Diagnosis Date  . Encounter for antineoplastic chemotherapy 12/31/2016  . GERD 11/08/2009   Qualifier: Diagnosis of  By: Ronnald Ramp MD, Arvid Right Goals of care, counseling/discussion 12/31/2016  . History of radiation therapy 01/13/2017 - 02/02/2017   Site/dose:  Left lung: 30 Gy in 15 fractions  . HYPERTENSION 11/08/2009   Qualifier: Diagnosis of  By: Ronnald Ramp MD, Arvid Right.   . 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 Migraine 02/25/2012  . Pneumonia   . PONV (postoperative nausea and vomiting)   . Rotator cuff tear, left     ALLERGIES:  has No Known Allergies.  MEDICATIONS:  Current Outpatient Medications  Medication Sig Dispense Refill  . albuterol (PROVENTIL HFA;VENTOLIN HFA) 108 (90 Base) MCG/ACT inhaler Inhale 2 puffs into the lungs every 4 (four) hours as needed.    Marland Kitchen amLODipine (NORVASC) 5 MG tablet Take 5 mg by mouth daily.    . budesonide (RA BUDESONIDE) 32 MCG/ACT nasal spray Place into both nostrils daily.    . BUDESONIDE PO Take by mouth. Budesonide slurry    . budesonide-formoterol (SYMBICORT) 80-4.5 MCG/ACT inhaler Inhale 2 puffs into the lungs 2 (two) times daily. 1 Inhaler 5  . chlorpheniramine-HYDROcodone (TUSSIONEX) 10-8 MG/5ML SUER Take 5 mLs by mouth every 12 (twelve) hours as needed for cough. 473 mL 0  . dexamethasone (DECADRON) 4 MG tablet 4 mg by mouth twice a day the day before, day of and day after the chemotherapy 40 tablet 1  . folic acid (FOLVITE) 1 MG tablet Take 1  tablet (1 mg total) by mouth daily. 30 tablet 4  . furosemide (LASIX) 20 MG tablet TAKE 1 TABLET BY MOUTH EVERY DAY AS NEEDED 20 tablet 0  . LORazepam (ATIVAN) 0.5 MG tablet Take 1 tablet (0.5 mg total) by mouth daily as needed for anxiety (anxiety). 30 tablet 0  . losartan (COZAAR) 50 MG tablet Take 50 mg by mouth daily.    . Multiple Vitamin (MULTIVITAMIN WITH MINERALS) TABS tablet Take 1 tablet by mouth daily.    .  ondansetron (ZOFRAN) 8 MG tablet TAKE 1 TABLET BY MOUTH EVERY 8 HOURS AS NEEDED FOR NAUSEA OR VOMITING. 30 tablet 1  . ondansetron (ZOFRAN) 8 MG tablet TAKE 1 TABLET BY MOUTH EVERY 8 HOURS AS NEEDED FOR NAUSEA OR VOMITING. 20 tablet 0  . pantoprazole (PROTONIX) 40 MG tablet Take 40 mg by mouth daily.  2  . PROAIR HFA 108 (90 Base) MCG/ACT inhaler Inhale 2 puffs into the lungs every 4 (four) hours as needed.  5  . prochlorperazine (COMPAZINE) 10 MG tablet TAKE 1 TABLET BY MOUTH  EVERY 6 HOURS AS NEEDED FOR NAUSEA OR VOMITING (Patient not taking: Reported on 06/14/2018) 30 tablet 0  . traMADol (ULTRAM) 50 MG tablet TAKE 1 TABLET BY MOUTH EVERY 12 HOURS AS NEEDED 30 tablet 0  . XARELTO 20 MG TABS tablet TAKE 1 TABLET EVERY DAY WITH SUPPER 90 tablet 0   No current facility-administered medications for this visit.     SURGICAL HISTORY:  Past Surgical History:  Procedure Laterality Date  . INGUINAL HERNIA REPAIR    . SHOULDER ARTHROSCOPY WITH ROTATOR CUFF REPAIR Left 11/12/2016   Procedure: SHOULDER ARTHROSCOPY WITH ROTATOR CUFF REPAIR AND SUBACROMIAL DECOMPRESSION;  Surgeon: Tania Ade, MD;  Location: [facility];  Service: Orthopedics;  Laterality: Left;  SHOULDER ARTHROSCOPY WITH ROTATOR CUFF REPAIR AND SUBACROMIAL DECOMPRESSION  . TONSILLECTOMY    . TOTAL KNEE ARTHROPLASTY    . VIDEO BRONCHOSCOPY Bilateral 12/10/2016   Procedure: VIDEO BRONCHOSCOPY WITHOUT FLUORO;  Surgeon: Tanda Rockers, MD;  Location: [facility];  Service: Cardiopulmonary;  Laterality: Bilateral;    REVIEW OF SYSTEMS:  Constitutional: positive for fatigue Eyes: negative Ears, nose, mouth, throat, and face: negative Respiratory: positive for cough Cardiovascular: negative Gastrointestinal: negative Genitourinary:negative Integument/breast: negative Hematologic/lymphatic: negative Musculoskeletal:negative Neurological: negative Behavioral/Psych: negative Endocrine: negative Allergic/Immunologic: negative   PHYSICAL  EXAMINATION: General appearance: alert, cooperative, fatigued and no distress Head: Normocephalic, without obvious abnormality, atraumatic Neck: no adenopathy, no JVD, supple, symmetrical, trachea midline and thyroid not enlarged, symmetric, no tenderness/mass/nodules Lymph nodes: Cervical, supraclavicular, and axillary nodes normal. Resp: clear to auscultation bilaterally Back: symmetric, no curvature. ROM normal. No CVA tenderness. Cardio: regular rate and rhythm, S1, S2 normal, no murmur, click, rub or gallop GI: soft, non-tender; bowel sounds normal; no masses,  no organomegaly Extremities: extremities normal, atraumatic, no cyanosis or edema Neurologic: Alert and oriented X 3, normal strength and tone. Normal symmetric reflexes. Normal coordination and gait  ECOG PERFORMANCE STATUS: 1 - Symptomatic but completely ambulatory  Blood pressure (!) 153/94, pulse (!) 112, temperature 98.1 F (36.7 C), temperature source Oral, resp. rate 18, height 6\' 1"  (1.854 m), weight 242 lb 11.2 oz (110.1 kg), SpO2 100 %.  LABORATORY DATA: Lab Results  Component Value Date   WBC 4.0 07/26/2018   HGB 14.9 07/26/2018   HCT 44.0 07/26/2018   MCV 111.7 (H) 07/26/2018   PLT 243 07/26/2018      Chemistry      Component Value Date/Time  NA 138 07/05/2018 1003   NA 138 10/08/2017 0907   K 3.8 07/05/2018 1003   K 4.1 10/08/2017 0907   CL 103 07/05/2018 1003   CO2 23 07/05/2018 1003   CO2 24 10/08/2017 0907   BUN 9 07/05/2018 1003   BUN 11.7 10/08/2017 0907   CREATININE 1.21 07/05/2018 1003   CREATININE 1.2 10/08/2017 0907      Component Value Date/Time   CALCIUM 9.2 07/05/2018 1003   CALCIUM 9.5 10/08/2017 0907   ALKPHOS 85 07/05/2018 1003   ALKPHOS 88 10/08/2017 0907   AST 23 07/05/2018 1003   AST 22 10/08/2017 0907   ALT 23 07/05/2018 1003   ALT 24 10/08/2017 0907   BILITOT 0.4 07/05/2018 1003   BILITOT 0.42 10/08/2017 0907       RADIOGRAPHIC STUDIES: Ct Chest W  Contrast  Result Date: 07/22/2018 CLINICAL DATA:  Patient with history of metastatic non-small cell lung cancer with poorly differentiated high-grade neuroendocrine carcinoma. Follow-up exam. EXAM: CT CHEST, ABDOMEN, AND PELVIS WITH CONTRAST TECHNIQUE: Multidetector CT imaging of the chest, abdomen and pelvis was performed following the standard protocol during bolus administration of intravenous contrast. CONTRAST:  130mL OMNIPAQUE IOHEXOL 300 MG/ML  SOLN COMPARISON:  CT CAP 04/07/2018 FINDINGS: CT CHEST FINDINGS Cardiovascular: Normal heart size. Trace pericardial fluid. Aorta and main pulmonary artery normal in caliber. Mediastinum/Nodes: No enlarged axillary, mediastinal or hilar lymphadenopathy. Normal appearance of the esophagus. Lungs/Pleura: Central airways are patent. Minimal increase in size of left lower lobe nodule measuring 1.4 x 1.2 cm (image 116; series 6), previously 1.2 x 0.8 cm. Similar-appearing postradiation changes paramediastinal left lung. No new pulmonary nodules. No pleural effusion or pneumothorax. Musculoskeletal: No aggressive or acute appearing osseous lesions. Thoracic spine degenerative changes. CT ABDOMEN PELVIS FINDINGS Hepatobiliary: Liver is normal in size and contour. No focal lesion identified. Gallbladder is unremarkable. No intrahepatic or extrahepatic biliary ductal dilatation. Pancreas: Unremarkable Spleen: Unremarkable Adrenals/Urinary Tract: Adrenal glands are normal. Kidneys enhance symmetrically with contrast. Unchanged 1.2 cm exophytic low-attenuation lesion inferior pole right kidney (image 85; series 2). Mild urinary bladder wall thickening and surrounding fat stranding. Stomach/Bowel: Oral contrast material to the rectum. Normal appendix. No evidence for small bowel obstruction. Normal morphology of the stomach. No free fluid or free intraperitoneal air. Vascular/Lymphatic: Normal caliber abdominal aorta. Peripheral calcified atherosclerotic plaque. No  retroperitoneal lymphadenopathy. Reproductive: Heterogeneous prostate. Other: Postsurgical changes in the pelvis. Musculoskeletal: Lumbar spine degenerative changes. No aggressive or acute appearing osseous lesions. IMPRESSION: 1. Slight interval increase in size of left lower lobe pulmonary nodule when compared to recent prior exam. 2. Stable left perihilar postradiation changes. Electronically Signed   By: Lovey Newcomer M.D.   On: 07/22/2018 14:40   Ct Abdomen Pelvis W Contrast  Result Date: 07/22/2018 CLINICAL DATA:  Patient with history of metastatic non-small cell lung cancer with poorly differentiated high-grade neuroendocrine carcinoma. Follow-up exam. EXAM: CT CHEST, ABDOMEN, AND PELVIS WITH CONTRAST TECHNIQUE: Multidetector CT imaging of the chest, abdomen and pelvis was performed following the standard protocol during bolus administration of intravenous contrast. CONTRAST:  185mL OMNIPAQUE IOHEXOL 300 MG/ML  SOLN COMPARISON:  CT CAP 04/07/2018 FINDINGS: CT CHEST FINDINGS Cardiovascular: Normal heart size. Trace pericardial fluid. Aorta and main pulmonary artery normal in caliber. Mediastinum/Nodes: No enlarged axillary, mediastinal or hilar lymphadenopathy. Normal appearance of the esophagus. Lungs/Pleura: Central airways are patent. Minimal increase in size of left lower lobe nodule measuring 1.4 x 1.2 cm (image 116; series 6), previously 1.2 x 0.8 cm. Similar-appearing  postradiation changes paramediastinal left lung. No new pulmonary nodules. No pleural effusion or pneumothorax. Musculoskeletal: No aggressive or acute appearing osseous lesions. Thoracic spine degenerative changes. CT ABDOMEN PELVIS FINDINGS Hepatobiliary: Liver is normal in size and contour. No focal lesion identified. Gallbladder is unremarkable. No intrahepatic or extrahepatic biliary ductal dilatation. Pancreas: Unremarkable Spleen: Unremarkable Adrenals/Urinary Tract: Adrenal glands are normal. Kidneys enhance symmetrically with  contrast. Unchanged 1.2 cm exophytic low-attenuation lesion inferior pole right kidney (image 85; series 2). Mild urinary bladder wall thickening and surrounding fat stranding. Stomach/Bowel: Oral contrast material to the rectum. Normal appendix. No evidence for small bowel obstruction. Normal morphology of the stomach. No free fluid or free intraperitoneal air. Vascular/Lymphatic: Normal caliber abdominal aorta. Peripheral calcified atherosclerotic plaque. No retroperitoneal lymphadenopathy. Reproductive: Heterogeneous prostate. Other: Postsurgical changes in the pelvis. Musculoskeletal: Lumbar spine degenerative changes. No aggressive or acute appearing osseous lesions. IMPRESSION: 1. Slight interval increase in size of left lower lobe pulmonary nodule when compared to recent prior exam. 2. Stable left perihilar postradiation changes. Electronically Signed   By: Lovey Newcomer M.D.   On: 07/22/2018 14:40    ASSESSMENT AND PLAN: This is a very pleasant 52 years old white male with stage IV non-small cell lung cancer, high-grade neuroendocrine carcinoma presented with locally advanced disease in the chest as well as solitary brain metastasis and 2 liver lesions. The patient is status post stereotactic radiotherapy to the solitary brain metastasis as well as short course of concurrent chemoradiation to the locally advanced disease in the chest. He recently completed 6 cycles of systemic chemotherapy with carboplatin, Alimta and Avastin and tolerated this treatment fairly well. He was started on maintenance treatment with Alimta and Avastin status post 15 cycles.  Avastin was discontinued secondary to intolerance to the combination treatment. The patient is currently on treatment with single agent Alimta and tolerating it well except for few episodes of nausea. Repeat CT scan of the chest, abdomen and pelvis were performed recently.  I personally and independently reviewed the scan images and discussed the result  and showed the images to the patient and his wife.  His a scan showed no concerning findings for disease progression except for a slight increase in size of left lower lobe pulmonary nodule. I recommended for the patient to continue his current treatment with single agent Alimta.  I will continue to monitor the left lower lobe pulmonary nodule closely on the upcoming scan and if it continues to increase in size as a single focus of disease progression, we may consider The patient for a stereotactic radiotherapy to this area. For the chest congestion and bronchitis, I will start the patient on Z pak. For hypertension, the patient was advised to monitor his blood pressure closely and to discuss with his primary care physician for adjustment of his medication if needed. He will come back for follow-up visit in 3 weeks for evaluation before starting cycle #19. The patient was advised to call immediately if he has any concerning symptoms in the interval. The patient voices understanding of current disease status and treatment options and is in agreement with the current care plan. All questions were answered. The patient knows to call the clinic with any problems, questions or concerns. We can certainly see the patient much sooner if necessary.  Disclaimer: This note was dictated with voice recognition software. Similar sounding words can inadvertently be transcribed and may not be corrected upon review.

## 2018-07-26 NOTE — Telephone Encounter (Signed)
Scheduled appt per 10/15 los - pt to get an updated schedule next visit.

## 2018-07-29 ENCOUNTER — Telehealth: Payer: Self-pay | Admitting: *Deleted

## 2018-07-29 NOTE — Telephone Encounter (Signed)
Received VM from pt's wife asking about OTC meds for helping pt with URI symptoms, mostly sore throat. He is currently taking an antibiotic (ZPak)per Dr. Julien Nordmann.  Advised wife that pt can take usual OTC meds for sore throat : Tylenol/Motrin, chlorapseptic spray, gargle with warm salt water.etc.  Wife voiced understanding  No other questions or concerns.

## 2018-08-05 DIAGNOSIS — E785 Hyperlipidemia, unspecified: Secondary | ICD-10-CM | POA: Diagnosis not present

## 2018-08-05 DIAGNOSIS — I10 Essential (primary) hypertension: Secondary | ICD-10-CM | POA: Diagnosis not present

## 2018-08-05 DIAGNOSIS — Z1211 Encounter for screening for malignant neoplasm of colon: Secondary | ICD-10-CM | POA: Diagnosis not present

## 2018-08-08 ENCOUNTER — Other Ambulatory Visit: Payer: Self-pay | Admitting: Medical Oncology

## 2018-08-08 ENCOUNTER — Telehealth: Payer: Self-pay | Admitting: Medical Oncology

## 2018-08-08 DIAGNOSIS — K1379 Other lesions of oral mucosa: Secondary | ICD-10-CM

## 2018-08-08 MED ORDER — MAGIC MOUTHWASH: 5.0000 mL | Freq: Four times a day (QID) | ORAL | 0 refills | Status: DC | PRN

## 2018-08-08 NOTE — Telephone Encounter (Signed)
Pt has sores in his mouth . Requesting [facility]W called to pharmacy.

## 2018-08-12 DIAGNOSIS — Z23 Encounter for immunization: Secondary | ICD-10-CM | POA: Diagnosis not present

## 2018-08-16 ENCOUNTER — Encounter: Payer: Self-pay | Admitting: Internal Medicine

## 2018-08-16 ENCOUNTER — Inpatient Hospital Stay (HOSPITAL_BASED_OUTPATIENT_CLINIC_OR_DEPARTMENT_OTHER): Payer: 59 | Admitting: Internal Medicine

## 2018-08-16 ENCOUNTER — Inpatient Hospital Stay: Payer: 59 | Attending: Internal Medicine

## 2018-08-16 ENCOUNTER — Inpatient Hospital Stay: Payer: 59

## 2018-08-16 ENCOUNTER — Other Ambulatory Visit: Payer: Self-pay | Admitting: Internal Medicine

## 2018-08-16 VITALS — BP 148/88 | HR 100 | Temp 98.0°F | Resp 18

## 2018-08-16 VITALS — BP 154/103 | HR 125 | Temp 98.6°F | Resp 18 | Ht 73.0 in | Wt 242.5 lb

## 2018-08-16 DIAGNOSIS — F419 Anxiety disorder, unspecified: Secondary | ICD-10-CM | POA: Insufficient documentation

## 2018-08-16 DIAGNOSIS — Z79899 Other long term (current) drug therapy: Secondary | ICD-10-CM | POA: Insufficient documentation

## 2018-08-16 DIAGNOSIS — Z7901 Long term (current) use of anticoagulants: Secondary | ICD-10-CM | POA: Diagnosis not present

## 2018-08-16 DIAGNOSIS — I10 Essential (primary) hypertension: Secondary | ICD-10-CM | POA: Insufficient documentation

## 2018-08-16 DIAGNOSIS — I2782 Chronic pulmonary embolism: Secondary | ICD-10-CM | POA: Insufficient documentation

## 2018-08-16 DIAGNOSIS — I2609 Other pulmonary embolism with acute cor pulmonale: Secondary | ICD-10-CM

## 2018-08-16 DIAGNOSIS — C7B02 Secondary carcinoid tumors of liver: Secondary | ICD-10-CM | POA: Diagnosis not present

## 2018-08-16 DIAGNOSIS — C3492 Malignant neoplasm of unspecified part of left bronchus or lung: Secondary | ICD-10-CM

## 2018-08-16 DIAGNOSIS — R000 Tachycardia, unspecified: Secondary | ICD-10-CM | POA: Insufficient documentation

## 2018-08-16 DIAGNOSIS — C7A019 Malignant carcinoid tumor of the small intestine, unspecified portion: Secondary | ICD-10-CM | POA: Diagnosis not present

## 2018-08-16 DIAGNOSIS — C7931 Secondary malignant neoplasm of brain: Secondary | ICD-10-CM

## 2018-08-16 DIAGNOSIS — Z5111 Encounter for antineoplastic chemotherapy: Secondary | ICD-10-CM | POA: Diagnosis not present

## 2018-08-16 LAB — CBC WITH DIFFERENTIAL (CANCER CENTER ONLY)
Abs Immature Granulocytes: 0.01 10*3/uL (ref 0.00–0.07)
Basophils Absolute: 0 10*3/uL (ref 0.0–0.1)
Basophils Relative: 0 %
Eosinophils Absolute: 0 10*3/uL (ref 0.0–0.5)
Eosinophils Relative: 0 %
HCT: 44.1 % (ref 39.0–52.0)
Hemoglobin: 14.9 g/dL (ref 13.0–17.0)
Immature Granulocytes: 0 %
Lymphocytes Relative: 6 %
Lymphs Abs: 0.4 10*3/uL — ABNORMAL LOW (ref 0.7–4.0)
MCH: 37.3 pg — ABNORMAL HIGH (ref 26.0–34.0)
MCHC: 33.8 g/dL (ref 30.0–36.0)
MCV: 110.5 fL — ABNORMAL HIGH (ref 80.0–100.0)
Monocytes Absolute: 0.1 10*3/uL (ref 0.1–1.0)
Monocytes Relative: 2 %
Neutro Abs: 5.3 10*3/uL (ref 1.7–7.7)
Neutrophils Relative %: 92 %
Platelet Count: 257 10*3/uL (ref 150–400)
RBC: 3.99 MIL/uL — ABNORMAL LOW (ref 4.22–5.81)
RDW: 15 % (ref 11.5–15.5)
WBC Count: 5.8 10*3/uL (ref 4.0–10.5)
nRBC: 0 % (ref 0.0–0.2)

## 2018-08-16 LAB — CMP (CANCER CENTER ONLY)
ALT: 18 U/L (ref 0–44)
AST: 18 U/L (ref 15–41)
Albumin: 3.6 g/dL (ref 3.5–5.0)
Alkaline Phosphatase: 105 U/L (ref 38–126)
Anion gap: 12 (ref 5–15)
BUN: 10 mg/dL (ref 6–20)
CO2: 21 mmol/L — ABNORMAL LOW (ref 22–32)
Calcium: 9.5 mg/dL (ref 8.9–10.3)
Chloride: 101 mmol/L (ref 98–111)
Creatinine: 1.3 mg/dL — ABNORMAL HIGH (ref 0.61–1.24)
GFR, Est AFR Am: >60 mL/min (ref >60)
GFR, Estimated: >60 mL/min (ref >60)
Glucose, Bld: 234 mg/dL — ABNORMAL HIGH (ref 70–99)
Potassium: 4.4 mmol/L (ref 3.5–5.1)
Sodium: 134 mmol/L — ABNORMAL LOW (ref 135–145)
Total Bilirubin: 0.6 mg/dL (ref 0.3–1.2)
Total Protein: 7.7 g/dL (ref 6.5–8.1)

## 2018-08-16 MED ORDER — SODIUM CHLORIDE 0.9 % IV SOLN
510.0000 mg/m2 | Freq: Once | INTRAVENOUS | Status: AC
  Administered 2018-08-16: 1200 mg via INTRAVENOUS
  Filled 2018-08-16: qty 40

## 2018-08-16 MED ORDER — SODIUM CHLORIDE 0.9 % IV SOLN
Freq: Once | INTRAVENOUS | Status: AC
  Administered 2018-08-16: 12:00:00 via INTRAVENOUS
  Filled 2018-08-16: qty 250

## 2018-08-16 MED ORDER — LORAZEPAM 0.5 MG PO TABS: 0.5000 mg | ORAL_TABLET | Freq: Every day | ORAL | 0 refills | Status: DC | PRN

## 2018-08-16 MED ORDER — PROCHLORPERAZINE MALEATE 10 MG PO TABS
10.0000 mg | ORAL_TABLET | Freq: Once | ORAL | Status: AC
  Administered 2018-08-16: 10 mg via ORAL

## 2018-08-16 MED ORDER — PROCHLORPERAZINE MALEATE 10 MG PO TABS
ORAL_TABLET | ORAL | Status: AC
  Filled 2018-08-16: qty 1

## 2018-08-16 NOTE — Patient Instructions (Signed)
[facility] Discharge Instructions for Patients Receiving Chemotherapy  Today you received the following chemotherapy agents Pemetrexed (Alimta).  To help prevent nausea and vomiting after your treatment, we encourage you to take your nausea medication as prescribed.   If you develop nausea and vomiting that is not controlled by your nausea medication, call the clinic.   BELOW ARE SYMPTOMS THAT SHOULD BE REPORTED IMMEDIATELY:  *FEVER GREATER THAN 100.5 F  *CHILLS WITH OR WITHOUT FEVER  NAUSEA AND VOMITING THAT IS NOT CONTROLLED WITH YOUR NAUSEA MEDICATION  *UNUSUAL SHORTNESS OF BREATH  *UNUSUAL BRUISING OR BLEEDING  TENDERNESS IN MOUTH AND THROAT WITH OR WITHOUT PRESENCE OF ULCERS  *URINARY PROBLEMS  *BOWEL PROBLEMS  UNUSUAL RASH Items with * indicate a potential emergency and should be followed up as soon as possible.  Feel free to call the clinic should you have any questions or concerns. The clinic phone number is [phone].  Please show the [facility] at check-in to the Emergency Department and triage nurse.

## 2018-08-16 NOTE — Progress Notes (Signed)
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  PRIOR THERAPY:  1) Stereotactic radiotherapy to a solitary brain metastasis under the care of Dr. Lisbeth Renshaw on 01/25/2017. 2) Short course of concurrent chemoradiation with weekly carboplatin for AUC of 2 and paclitaxel 45 MG/M2 to the locally advanced disease in the chest. Status post 3 cycles. 3) Systemic chemotherapy with carboplatin for AUC of 5, Alimta 500 MG/M2 and Avastin 15 MG/KG every 3 weeks. First dose of 02/15/2017. Status post 6 cycles. Last dose 06/08/2017 with stable disease.   CURRENT THERAPY: Maintenance systemic chemotherapy with Alimta 500 MG/M2 in addition to Avastin 15 MG/KG every 3 weeks. First dose 07/13/2017.  Status post 18 cycles.  Starting from cycle #16 he will be treated with single agent Alimta only secondary to intolerance.  INTERVAL HISTORY: Calvin Wagner 52 y.o. male returns to the clinic today for follow-up visit accompanied by his wife.  The patient is feeling fine today with no specific complaints except for increasing fatigue.  He also gets short of breath with minimal exertion.  His heart rate is elevated today.  He denied having any chest pain but has increasing cough with no hemoptysis.  He has no weight loss or night sweats.  He continues to have nausea for few days after his treatment but much better than before.  He denied having any vomiting, diarrhea or constipation.  He is here for evaluation before starting cycle #19 of his treatment.  MEDICAL HISTORY: Past Medical History:    Diagnosis Date  . Encounter for antineoplastic chemotherapy 12/31/2016  . GERD 11/08/2009   Qualifier: Diagnosis of  By: Ronnald Ramp MD, Arvid Right Goals of care, counseling/discussion 12/31/2016  . History of radiation therapy 01/13/2017 - 02/02/2017   Site/dose:  Left lung: 30 Gy in 15 fractions  . HYPERTENSION 11/08/2009   Qualifier: Diagnosis of  By: Ronnald Ramp MD, Arvid Right.   . 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 Migraine 02/25/2012  . Pneumonia   . PONV (postoperative nausea and vomiting)   . Rotator cuff tear, left     ALLERGIES:  has No Known Allergies.  MEDICATIONS:  Current Outpatient Medications  Medication Sig Dispense Refill  . albuterol (PROVENTIL HFA;VENTOLIN HFA) 108 (90 Base) MCG/ACT inhaler Inhale 2 puffs into the lungs every 4 (four) hours as needed.    Marland Kitchen amLODipine (NORVASC) 5 MG tablet Take 5 mg by mouth daily.    . budesonide (RA BUDESONIDE) 32 MCG/ACT nasal spray Place into both nostrils daily.    . BUDESONIDE PO Take by mouth. Budesonide slurry    . budesonide-formoterol (SYMBICORT) 80-4.5 MCG/ACT inhaler Inhale 2 puffs into the lungs 2 (two) times daily. 1 Inhaler 5  . chlorpheniramine-HYDROcodone (TUSSIONEX) 10-8 MG/5ML SUER Take 5 mLs by mouth every 12 (twelve) hours as needed for cough. 473 mL 0  . dexamethasone (DECADRON) 4 MG tablet 4 mg by mouth twice a day the day before, day of and day after the chemotherapy 40 tablet 1  . folic acid (FOLVITE) 1 MG tablet Take 1 tablet (1 mg  total) by mouth daily. 30 tablet 4  . furosemide (LASIX) 20 MG tablet TAKE 1 TABLET BY MOUTH EVERY DAY AS NEEDED 20 tablet 0  . LORazepam (ATIVAN) 0.5 MG tablet Take 1 tablet (0.5 mg total) by mouth daily as needed for anxiety (anxiety). 30 tablet 0  . losartan (COZAAR) 50 MG tablet Take 50 mg by mouth daily.    . magic mouthwash SOLN Take 5 mLs by mouth 4 (four) times daily as needed for mouth pain. 1:1:1 mix-Nystatin. Benadryl and extra strength maalox. 240 mL 0   . Multiple Vitamin (MULTIVITAMIN WITH MINERALS) TABS tablet Take 1 tablet by mouth daily.    . ondansetron (ZOFRAN) 8 MG tablet TAKE 1 TABLET BY MOUTH EVERY 8 HOURS AS NEEDED FOR NAUSEA OR VOMITING. 30 tablet 1  . ondansetron (ZOFRAN) 8 MG tablet TAKE 1 TABLET BY MOUTH EVERY 8 HOURS AS NEEDED FOR NAUSEA OR VOMITING. 20 tablet 0  . pantoprazole (PROTONIX) 40 MG tablet Take 40 mg by mouth daily.  2  . PROAIR HFA 108 (90 Base) MCG/ACT inhaler Inhale 2 puffs into the lungs every 4 (four) hours as needed.  5  . prochlorperazine (COMPAZINE) 10 MG tablet TAKE 1 TABLET BY MOUTH  EVERY 6 HOURS AS NEEDED FOR NAUSEA OR VOMITING 30 tablet 0  . traMADol (ULTRAM) 50 MG tablet TAKE 1 TABLET BY MOUTH EVERY 12 HOURS AS NEEDED 30 tablet 0  . XARELTO 20 MG TABS tablet TAKE 1 TABLET EVERY DAY WITH SUPPER 90 tablet 0  . magic mouthwash SOLN SWISH AND SPIT OR SWALLOW 5MLS 4 TIMES A DAY AS NEEDED  0   No current facility-administered medications for this visit.     SURGICAL HISTORY:  Past Surgical History:  Procedure Laterality Date  . INGUINAL HERNIA REPAIR    . SHOULDER ARTHROSCOPY WITH ROTATOR CUFF REPAIR Left 11/12/2016   Procedure: SHOULDER ARTHROSCOPY WITH ROTATOR CUFF REPAIR AND SUBACROMIAL DECOMPRESSION;  Surgeon: Tania Ade, MD;  Location: [facility];  Service: Orthopedics;  Laterality: Left;  SHOULDER ARTHROSCOPY WITH ROTATOR CUFF REPAIR AND SUBACROMIAL DECOMPRESSION  . TONSILLECTOMY    . TOTAL KNEE ARTHROPLASTY    . VIDEO BRONCHOSCOPY Bilateral 12/10/2016   Procedure: VIDEO BRONCHOSCOPY WITHOUT FLUORO;  Surgeon: Tanda Rockers, MD;  Location: [facility];  Service: Cardiopulmonary;  Laterality: Bilateral;    REVIEW OF SYSTEMS:  A comprehensive review of systems was negative except for: Constitutional: positive for fatigue Respiratory: positive for cough Gastrointestinal: positive for nausea   PHYSICAL EXAMINATION: General appearance: alert, cooperative, fatigued and no distress Head: Normocephalic,  without obvious abnormality, atraumatic Neck: no adenopathy, no JVD, supple, symmetrical, trachea midline and thyroid not enlarged, symmetric, no tenderness/mass/nodules Lymph nodes: Cervical, supraclavicular, and axillary nodes normal. Resp: clear to auscultation bilaterally Back: symmetric, no curvature. ROM normal. No CVA tenderness. Cardio: regular rate and rhythm, S1, S2 normal, no murmur, click, rub or gallop GI: soft, non-tender; bowel sounds normal; no masses,  no organomegaly Extremities: extremities normal, atraumatic, no cyanosis or edema  ECOG PERFORMANCE STATUS: 1 - Symptomatic but completely ambulatory  Blood pressure (!) 154/103, pulse (!) 125, temperature 98.6 F (37 C), temperature source Oral, resp. rate 18, height 6\' 1"  (1.854 m), weight 242 lb 8 oz (110 kg), SpO2 98 %.  LABORATORY DATA: Lab Results  Component Value Date   WBC 5.8 08/16/2018   HGB 14.9 08/16/2018   HCT 44.1 08/16/2018   MCV 110.5 (H) 08/16/2018   PLT 257 08/16/2018      Chemistry  Component Value Date/Time   NA 134 (L) 08/16/2018 1030   NA 138 10/08/2017 0907   K 4.4 08/16/2018 1030   K 4.1 10/08/2017 0907   CL 101 08/16/2018 1030   CO2 21 (L) 08/16/2018 1030   CO2 24 10/08/2017 0907   BUN 10 08/16/2018 1030   BUN 11.7 10/08/2017 0907   CREATININE 1.30 (H) 08/16/2018 1030   CREATININE 1.2 10/08/2017 0907      Component Value Date/Time   CALCIUM 9.5 08/16/2018 1030   CALCIUM 9.5 10/08/2017 0907   ALKPHOS 105 08/16/2018 1030   ALKPHOS 88 10/08/2017 0907   AST 18 08/16/2018 1030   AST 22 10/08/2017 0907   ALT 18 08/16/2018 1030   ALT 24 10/08/2017 0907   BILITOT 0.6 08/16/2018 1030   BILITOT 0.42 10/08/2017 0907       RADIOGRAPHIC STUDIES: Ct Chest W Contrast  Result Date: 07/22/2018 CLINICAL DATA:  Patient with history of metastatic non-small cell lung cancer with poorly differentiated high-grade neuroendocrine carcinoma. Follow-up exam. EXAM: CT CHEST, ABDOMEN, AND  PELVIS WITH CONTRAST TECHNIQUE: Multidetector CT imaging of the chest, abdomen and pelvis was performed following the standard protocol during bolus administration of intravenous contrast. CONTRAST:  144mL OMNIPAQUE IOHEXOL 300 MG/ML  SOLN COMPARISON:  CT CAP 04/07/2018 FINDINGS: CT CHEST FINDINGS Cardiovascular: Normal heart size. Trace pericardial fluid. Aorta and main pulmonary artery normal in caliber. Mediastinum/Nodes: No enlarged axillary, mediastinal or hilar lymphadenopathy. Normal appearance of the esophagus. Lungs/Pleura: Central airways are patent. Minimal increase in size of left lower lobe nodule measuring 1.4 x 1.2 cm (image 116; series 6), previously 1.2 x 0.8 cm. Similar-appearing postradiation changes paramediastinal left lung. No new pulmonary nodules. No pleural effusion or pneumothorax. Musculoskeletal: No aggressive or acute appearing osseous lesions. Thoracic spine degenerative changes. CT ABDOMEN PELVIS FINDINGS Hepatobiliary: Liver is normal in size and contour. No focal lesion identified. Gallbladder is unremarkable. No intrahepatic or extrahepatic biliary ductal dilatation. Pancreas: Unremarkable Spleen: Unremarkable Adrenals/Urinary Tract: Adrenal glands are normal. Kidneys enhance symmetrically with contrast. Unchanged 1.2 cm exophytic low-attenuation lesion inferior pole right kidney (image 85; series 2). Mild urinary bladder wall thickening and surrounding fat stranding. Stomach/Bowel: Oral contrast material to the rectum. Normal appendix. No evidence for small bowel obstruction. Normal morphology of the stomach. No free fluid or free intraperitoneal air. Vascular/Lymphatic: Normal caliber abdominal aorta. Peripheral calcified atherosclerotic plaque. No retroperitoneal lymphadenopathy. Reproductive: Heterogeneous prostate. Other: Postsurgical changes in the pelvis. Musculoskeletal: Lumbar spine degenerative changes. No aggressive or acute appearing osseous lesions. IMPRESSION: 1.  Slight interval increase in size of left lower lobe pulmonary nodule when compared to recent prior exam. 2. Stable left perihilar postradiation changes. Electronically Signed   By: Lovey Newcomer M.D.   On: 07/22/2018 14:40   Ct Abdomen Pelvis W Contrast  Result Date: 07/22/2018 CLINICAL DATA:  Patient with history of metastatic non-small cell lung cancer with poorly differentiated high-grade neuroendocrine carcinoma. Follow-up exam. EXAM: CT CHEST, ABDOMEN, AND PELVIS WITH CONTRAST TECHNIQUE: Multidetector CT imaging of the chest, abdomen and pelvis was performed following the standard protocol during bolus administration of intravenous contrast. CONTRAST:  180mL OMNIPAQUE IOHEXOL 300 MG/ML  SOLN COMPARISON:  CT CAP 04/07/2018 FINDINGS: CT CHEST FINDINGS Cardiovascular: Normal heart size. Trace pericardial fluid. Aorta and main pulmonary artery normal in caliber. Mediastinum/Nodes: No enlarged axillary, mediastinal or hilar lymphadenopathy. Normal appearance of the esophagus. Lungs/Pleura: Central airways are patent. Minimal increase in size of left lower lobe nodule measuring 1.4 x 1.2 cm (image 116;  series 6), previously 1.2 x 0.8 cm. Similar-appearing postradiation changes paramediastinal left lung. No new pulmonary nodules. No pleural effusion or pneumothorax. Musculoskeletal: No aggressive or acute appearing osseous lesions. Thoracic spine degenerative changes. CT ABDOMEN PELVIS FINDINGS Hepatobiliary: Liver is normal in size and contour. No focal lesion identified. Gallbladder is unremarkable. No intrahepatic or extrahepatic biliary ductal dilatation. Pancreas: Unremarkable Spleen: Unremarkable Adrenals/Urinary Tract: Adrenal glands are normal. Kidneys enhance symmetrically with contrast. Unchanged 1.2 cm exophytic low-attenuation lesion inferior pole right kidney (image 85; series 2). Mild urinary bladder wall thickening and surrounding fat stranding. Stomach/Bowel: Oral contrast material to the rectum.  Normal appendix. No evidence for small bowel obstruction. Normal morphology of the stomach. No free fluid or free intraperitoneal air. Vascular/Lymphatic: Normal caliber abdominal aorta. Peripheral calcified atherosclerotic plaque. No retroperitoneal lymphadenopathy. Reproductive: Heterogeneous prostate. Other: Postsurgical changes in the pelvis. Musculoskeletal: Lumbar spine degenerative changes. No aggressive or acute appearing osseous lesions. IMPRESSION: 1. Slight interval increase in size of left lower lobe pulmonary nodule when compared to recent prior exam. 2. Stable left perihilar postradiation changes. Electronically Signed   By: Lovey Newcomer M.D.   On: 07/22/2018 14:40    ASSESSMENT AND PLAN: This is a very pleasant 52 years old white male with stage IV non-small cell lung cancer, high-grade neuroendocrine carcinoma presented with locally advanced disease in the chest as well as solitary brain metastasis and 2 liver lesions. The patient is status post stereotactic radiotherapy to the solitary brain metastasis as well as short course of concurrent chemoradiation to the locally advanced disease in the chest. He recently completed 6 cycles of systemic chemotherapy with carboplatin, Alimta and Avastin and tolerated this treatment fairly well. He was started on maintenance treatment with Alimta and Avastin status post 15 cycles.  Avastin was discontinued secondary to intolerance to the combination treatment.  He is currently on treatment with single agent Alimta status post 3 cycles. The patient continues to tolerate this treatment fairly well with single agent Alimta. I recommended for him to proceed with cycle #18 today as scheduled. I will see him back for follow-up visit in 3 weeks for evaluation before the next cycle of his treatment. For the tachycardia I will order an EKG today to rule out any cardiac abnormality. For anxiety, I gave the patient refill of Ativan today. The patient was advised  to call immediately if he has any concerning symptoms in the interval. The patient voices understanding of current disease status and treatment options and is in agreement with the current care plan. All questions were answered. The patient knows to call the clinic with any problems, questions or concerns. We can certainly see the patient much sooner if necessary.  Disclaimer: This note was dictated with voice recognition software. Similar sounding words can inadvertently be transcribed and may not be corrected upon review.

## 2018-08-26 ENCOUNTER — Telehealth: Payer: Self-pay | Admitting: *Deleted

## 2018-08-26 NOTE — Telephone Encounter (Signed)
Wife Baker Janus called requesting appts to be changed to after Thanksgiving - either Monday or Tuesday. [facility]   Phone      [phone].

## 2018-08-31 ENCOUNTER — Telehealth: Payer: Self-pay | Admitting: *Deleted

## 2018-08-31 NOTE — Telephone Encounter (Signed)
Message on vm from wife requesting pt appt 11/26 be moved to 12/2 or 12/3. Message to scheduling with this request.

## 2018-09-01 ENCOUNTER — Telehealth: Payer: Self-pay | Admitting: Internal Medicine

## 2018-09-01 NOTE — Telephone Encounter (Signed)
R/s appt per 11/20 sch message - left message for patient with appt date and time.

## 2018-09-02 ENCOUNTER — Other Ambulatory Visit: Payer: Self-pay | Admitting: *Deleted

## 2018-09-02 DIAGNOSIS — D4989 Neoplasm of unspecified behavior of other specified sites: Secondary | ICD-10-CM

## 2018-09-06 ENCOUNTER — Other Ambulatory Visit: Payer: 59

## 2018-09-06 ENCOUNTER — Ambulatory Visit: Payer: 59

## 2018-09-06 ENCOUNTER — Ambulatory Visit: Payer: 59 | Admitting: Internal Medicine

## 2018-09-12 ENCOUNTER — Inpatient Hospital Stay: Payer: 59

## 2018-09-12 ENCOUNTER — Other Ambulatory Visit: Payer: Self-pay

## 2018-09-12 ENCOUNTER — Inpatient Hospital Stay (HOSPITAL_BASED_OUTPATIENT_CLINIC_OR_DEPARTMENT_OTHER): Payer: 59 | Admitting: Oncology

## 2018-09-12 ENCOUNTER — Encounter: Payer: Self-pay | Admitting: Oncology

## 2018-09-12 ENCOUNTER — Telehealth: Payer: Self-pay | Admitting: Internal Medicine

## 2018-09-12 VITALS — BP 164/103 | HR 128 | Temp 98.9°F | Resp 17 | Ht 73.0 in | Wt 244.2 lb

## 2018-09-12 VITALS — BP 149/87 | HR 98

## 2018-09-12 DIAGNOSIS — C7A019 Malignant carcinoid tumor of the small intestine, unspecified portion: Secondary | ICD-10-CM

## 2018-09-12 DIAGNOSIS — R110 Nausea: Secondary | ICD-10-CM

## 2018-09-12 DIAGNOSIS — F1020 Alcohol dependence, uncomplicated: Secondary | ICD-10-CM

## 2018-09-12 DIAGNOSIS — C7931 Secondary malignant neoplasm of brain: Secondary | ICD-10-CM | POA: Diagnosis present

## 2018-09-12 DIAGNOSIS — C3492 Malignant neoplasm of unspecified part of left bronchus or lung: Secondary | ICD-10-CM

## 2018-09-12 DIAGNOSIS — C7B09 Secondary carcinoid tumors of other sites: Secondary | ICD-10-CM | POA: Insufficient documentation

## 2018-09-12 DIAGNOSIS — C7B02 Secondary carcinoid tumors of liver: Secondary | ICD-10-CM

## 2018-09-12 DIAGNOSIS — Z79899 Other long term (current) drug therapy: Secondary | ICD-10-CM

## 2018-09-12 DIAGNOSIS — I10 Essential (primary) hypertension: Secondary | ICD-10-CM

## 2018-09-12 DIAGNOSIS — Z5111 Encounter for antineoplastic chemotherapy: Secondary | ICD-10-CM

## 2018-09-12 DIAGNOSIS — Z86711 Personal history of pulmonary embolism: Secondary | ICD-10-CM

## 2018-09-12 DIAGNOSIS — Z7901 Long term (current) use of anticoagulants: Secondary | ICD-10-CM | POA: Insufficient documentation

## 2018-09-12 DIAGNOSIS — Z923 Personal history of irradiation: Secondary | ICD-10-CM

## 2018-09-12 LAB — CBC WITH DIFFERENTIAL (CANCER CENTER ONLY)
Abs Immature Granulocytes: 0.03 10*3/uL (ref 0.00–0.07)
Basophils Absolute: 0 10*3/uL (ref 0.0–0.1)
Basophils Relative: 0 %
Eosinophils Absolute: 0 10*3/uL (ref 0.0–0.5)
Eosinophils Relative: 0 %
HCT: 44.7 % (ref 39.0–52.0)
Hemoglobin: 15.2 g/dL (ref 13.0–17.0)
Immature Granulocytes: 1 %
Lymphocytes Relative: 6 %
Lymphs Abs: 0.4 10*3/uL — ABNORMAL LOW (ref 0.7–4.0)
MCH: 37.9 pg — ABNORMAL HIGH (ref 26.0–34.0)
MCHC: 34 g/dL (ref 30.0–36.0)
MCV: 111.5 fL — ABNORMAL HIGH (ref 80.0–100.0)
Monocytes Absolute: 0.3 10*3/uL (ref 0.1–1.0)
Monocytes Relative: 5 %
Neutro Abs: 5.7 10*3/uL (ref 1.7–7.7)
Neutrophils Relative %: 88 %
Platelet Count: 227 10*3/uL (ref 150–400)
RBC: 4.01 MIL/uL — ABNORMAL LOW (ref 4.22–5.81)
RDW: 15.9 % — ABNORMAL HIGH (ref 11.5–15.5)
WBC Count: 6.4 10*3/uL (ref 4.0–10.5)
nRBC: 0 % (ref 0.0–0.2)

## 2018-09-12 LAB — CMP (CANCER CENTER ONLY)
ALT: 18 U/L (ref 0–44)
AST: 18 U/L (ref 15–41)
Albumin: 3.6 g/dL (ref 3.5–5.0)
Alkaline Phosphatase: 91 U/L (ref 38–126)
Anion gap: 10 (ref 5–15)
BUN: 10 mg/dL (ref 6–20)
CO2: 22 mmol/L (ref 22–32)
Calcium: 9.4 mg/dL (ref 8.9–10.3)
Chloride: 102 mmol/L (ref 98–111)
Creatinine: 1.35 mg/dL — ABNORMAL HIGH (ref 0.61–1.24)
GFR, Est AFR Am: >60 mL/min (ref >60)
GFR, Estimated: >60 mL/min (ref >60)
Glucose, Bld: 231 mg/dL — ABNORMAL HIGH (ref 70–99)
Potassium: 4.4 mmol/L (ref 3.5–5.1)
Sodium: 134 mmol/L — ABNORMAL LOW (ref 135–145)
Total Bilirubin: 0.5 mg/dL (ref 0.3–1.2)
Total Protein: 7.5 g/dL (ref 6.5–8.1)

## 2018-09-12 MED ORDER — PROCHLORPERAZINE MALEATE 10 MG PO TABS
ORAL_TABLET | ORAL | Status: AC
  Filled 2018-09-12: qty 1

## 2018-09-12 MED ORDER — PROCHLORPERAZINE MALEATE 10 MG PO TABS
10.0000 mg | ORAL_TABLET | Freq: Once | ORAL | Status: AC
  Administered 2018-09-12: 10 mg via ORAL

## 2018-09-12 MED ORDER — CYANOCOBALAMIN 1000 MCG/ML IJ SOLN
INTRAMUSCULAR | Status: AC
  Filled 2018-09-12: qty 1

## 2018-09-12 MED ORDER — SODIUM CHLORIDE 0.9 % IV SOLN
510.0000 mg/m2 | Freq: Once | INTRAVENOUS | Status: AC
  Administered 2018-09-12: 1200 mg via INTRAVENOUS
  Filled 2018-09-12: qty 40

## 2018-09-12 MED ORDER — SODIUM CHLORIDE 0.9 % IV SOLN
Freq: Once | INTRAVENOUS | Status: AC
  Administered 2018-09-12: 15:00:00 via INTRAVENOUS
  Filled 2018-09-12: qty 250

## 2018-09-12 MED ORDER — CYANOCOBALAMIN 1000 MCG/ML IJ SOLN
1000.0000 ug | Freq: Once | INTRAMUSCULAR | Status: AC
  Administered 2018-09-12: 1000 ug via INTRAMUSCULAR

## 2018-09-12 NOTE — Telephone Encounter (Signed)
°    09/12/18 1:56 PM  Note    Patient bypassed scheduling. Appointments schedule per 12/2 los. Spoke with patient re next appointment 12/31 (date per los). Patient will confirm appointments via my chart and get updated schedule at next visit 12/31

## 2018-09-12 NOTE — Progress Notes (Signed)
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  PRIOR THERAPY:  1) Stereotactic radiotherapy to a solitary brain metastasis under the care of Dr. Lisbeth Renshaw on 01/25/2017. 2) Short course of concurrent chemoradiation with weekly carboplatin for AUC of 2 and paclitaxel 45 MG/M2 to the locally advanced disease in the chest. Status post 3 cycles. 3) Systemic chemotherapy with carboplatin for AUC of 5, Alimta 500 MG/M2 and Avastin 15 MG/KG every 3 weeks. First dose of 02/15/2017. Status post 6 cycles. Last dose 06/08/2017 with stable disease.   CURRENT THERAPY: Maintenance systemic chemotherapy with Alimta 500 MG/M2 in addition to Avastin 15 MG/KG every 3 weeks. First dose 07/13/2017.  Status post 19 cycles.  Starting from cycle #16 he will be treated with single agent Alimta only secondary to intolerance.  INTERVAL HISTORY: CAYDN JUSTEN 52 y.o. male returns for a routine follow-up visit by himself.  The patient is feeling fine today and has no specific complaints except for generalized fatigue.  This is really unchanged.  He denies fevers and chills.  Denies chest pain, shortness of breath, cough, hemoptysis.  Reports mild intermittent nausea.  Denies vomiting, constipation, diarrhea.  Denies recent weight loss or night sweats.  Patient is here for evaluation prior to cycle #20 of his treatment.  MEDICAL HISTORY: Past Medical History:  Diagnosis Date  . Encounter for antineoplastic chemotherapy 12/31/2016  . GERD 11/08/2009   Qualifier: Diagnosis of  By: Ronnald Ramp MD, Arvid Right Goals of care, counseling/discussion  12/31/2016  . History of radiation therapy 01/13/2017 - 02/02/2017   Site/dose:  Left lung: 30 Gy in 15 fractions  . HYPERTENSION 11/08/2009   Qualifier: Diagnosis of  By: Ronnald Ramp MD, Arvid Right.   . Large cell carcinoma of left lung, stage 4 ([facility]) 12/31/2016  . Migraine 02/25/2012  . Pneumonia   . PONV (postoperative nausea and vomiting)   . Rotator cuff tear, left     ALLERGIES:  has No Known Allergies.  MEDICATIONS:  Current Outpatient Medications  Medication Sig Dispense Refill  . albuterol (PROVENTIL HFA;VENTOLIN HFA) 108 (90 Base) MCG/ACT inhaler Inhale 2 puffs into the lungs every 4 (four) hours as needed.    Marland Kitchen amLODipine (NORVASC) 5 MG tablet Take 5 mg by mouth daily.    . budesonide (RA BUDESONIDE) 32 MCG/ACT nasal spray Place into both nostrils daily.    . BUDESONIDE PO Take by mouth. Budesonide slurry    . budesonide-formoterol (SYMBICORT) 80-4.5 MCG/ACT inhaler Inhale 2 puffs into the lungs 2 (two) times daily. 1 Inhaler 5  . chlorpheniramine-HYDROcodone (TUSSIONEX) 10-8 MG/5ML SUER Take 5 mLs by mouth every 12 (twelve) hours as needed for cough. 473 mL 0  . dexamethasone (DECADRON) 4 MG tablet 4 mg by mouth twice a day the day before, day of and day after the chemotherapy 40 tablet 1  . folic acid (FOLVITE) 1 MG tablet Take 1 tablet (1 mg total) by mouth daily. 30 tablet 4  . furosemide (LASIX) 20 MG tablet TAKE 1 TABLET BY MOUTH EVERY DAY AS NEEDED 20 tablet 0  . LORazepam (ATIVAN) 0.5 MG tablet Take 1 tablet (0.5 mg total) by mouth daily as  needed for anxiety (anxiety). 30 tablet 0  . losartan (COZAAR) 50 MG tablet Take 50 mg by mouth daily.    . magic mouthwash SOLN Take 5 mLs by mouth 4 (four) times daily as needed for mouth pain. 1:1:1 mix-Nystatin. Benadryl and extra strength maalox. 240 mL 0  . Multiple Vitamin (MULTIVITAMIN WITH MINERALS) TABS tablet Take 1 tablet by mouth daily.    . ondansetron (ZOFRAN) 8 MG tablet TAKE 1 TABLET BY MOUTH EVERY 8 HOURS AS NEEDED FOR NAUSEA  OR VOMITING. 30 tablet 1  . pantoprazole (PROTONIX) 40 MG tablet Take 40 mg by mouth daily.  2  . PROAIR HFA 108 (90 Base) MCG/ACT inhaler Inhale 2 puffs into the lungs every 4 (four) hours as needed.  5  . prochlorperazine (COMPAZINE) 10 MG tablet TAKE 1 TABLET BY MOUTH  EVERY 6 HOURS AS NEEDED FOR NAUSEA OR VOMITING 30 tablet 0  . traMADol (ULTRAM) 50 MG tablet TAKE 1 TABLET BY MOUTH EVERY 12 HOURS AS NEEDED 30 tablet 0  . XARELTO 20 MG TABS tablet TAKE 1 TABLET EVERY DAY WITH SUPPER 90 tablet 0  . magic mouthwash SOLN SWISH AND SPIT OR SWALLOW 5MLS 4 TIMES A DAY AS NEEDED  0  . ondansetron (ZOFRAN) 8 MG tablet TAKE 1 TABLET BY MOUTH EVERY 8 HOURS AS NEEDED FOR NAUSEA OR VOMITING. (Patient not taking: Reported on 09/12/2018) 20 tablet 0   No current facility-administered medications for this visit.     SURGICAL HISTORY:  Past Surgical History:  Procedure Laterality Date  . INGUINAL HERNIA REPAIR    . SHOULDER ARTHROSCOPY WITH ROTATOR CUFF REPAIR Left 11/12/2016   Procedure: SHOULDER ARTHROSCOPY WITH ROTATOR CUFF REPAIR AND SUBACROMIAL DECOMPRESSION;  Surgeon: Tania Ade, MD;  Location: [facility];  Service: Orthopedics;  Laterality: Left;  SHOULDER ARTHROSCOPY WITH ROTATOR CUFF REPAIR AND SUBACROMIAL DECOMPRESSION  . TONSILLECTOMY    . TOTAL KNEE ARTHROPLASTY    . VIDEO BRONCHOSCOPY Bilateral 12/10/2016   Procedure: VIDEO BRONCHOSCOPY WITHOUT FLUORO;  Surgeon: Tanda Rockers, MD;  Location: [facility];  Service: Cardiopulmonary;  Laterality: Bilateral;    REVIEW OF SYSTEMS:   Review of Systems  Constitutional: Negative for appetite change, chills, fever and unexpected weight change.  Positive for fatigue. HENT:   Negative for mouth sores, nosebleeds, sore throat and trouble swallowing.   Eyes: Negative for eye problems and icterus.  Respiratory: Negative for cough, hemoptysis, shortness of breath and wheezing.   Cardiovascular: Negative for chest pain and leg swelling.   Gastrointestinal: Negative for abdominal pain, constipation, diarrhea, and vomiting.  Positive for intermittent nausea. Genitourinary: Negative for bladder incontinence, difficulty urinating, dysuria, frequency and hematuria.   Musculoskeletal: Negative for back pain, gait problem, neck pain and neck stiffness.  Skin: Negative for itching and rash.  Neurological: Negative for dizziness, extremity weakness, gait problem, headaches, light-headedness and seizures.  Hematological: Negative for adenopathy. Does not bruise/bleed easily.  Psychiatric/Behavioral: Negative for confusion, depression and sleep disturbance. The patient is not nervous/anxious.     PHYSICAL EXAMINATION:  Blood pressure (!) 164/103, pulse (!) 128, temperature 98.9 F (37.2 C), temperature source Oral, resp. rate 17, height 6\' 1"  (1.854 m), weight 244 lb 3.2 oz (110.8 kg), SpO2 99 %.  ECOG PERFORMANCE STATUS: 1 - Symptomatic but completely ambulatory  Physical Exam  Constitutional: Oriented to person, place, and time and well-developed, well-nourished, and in no distress. No distress.  HENT:  Head: Normocephalic and atraumatic.  Mouth/Throat: Oropharynx is clear and moist. No oropharyngeal  exudate.  Eyes: Conjunctivae are normal. Right eye exhibits no discharge. Left eye exhibits no discharge. No scleral icterus.  Neck: Normal range of motion. Neck supple.  Cardiovascular: Tachycardic, regular rhythm, normal heart sounds and intact distal pulses.   Pulmonary/Chest: Effort normal and breath sounds normal. No respiratory distress. No wheezes. No rales.  Abdominal: Soft. Bowel sounds are normal. Exhibits no distension and no mass. There is no tenderness.  Musculoskeletal: Normal range of motion. Exhibits no edema.  Lymphadenopathy:    No cervical adenopathy.  Neurological: Alert and oriented to person, place, and time. Exhibits normal muscle tone. Gait normal. Coordination normal.  Skin: Skin is warm and dry. No rash  noted. Not diaphoretic. No erythema. No pallor.  Psychiatric: Mood, memory and judgment normal.  Vitals reviewed.  LABORATORY DATA: Lab Results  Component Value Date   WBC 6.4 09/12/2018   HGB 15.2 09/12/2018   HCT 44.7 09/12/2018   MCV 111.5 (H) 09/12/2018   PLT 227 09/12/2018      Chemistry      Component Value Date/Time   NA 134 (L) 09/12/2018 1247   NA 138 10/08/2017 0907   K 4.4 09/12/2018 1247   K 4.1 10/08/2017 0907   CL 102 09/12/2018 1247   CO2 22 09/12/2018 1247   CO2 24 10/08/2017 0907   BUN 10 09/12/2018 1247   BUN 11.7 10/08/2017 0907   CREATININE 1.35 (H) 09/12/2018 1247   CREATININE 1.2 10/08/2017 0907      Component Value Date/Time   CALCIUM 9.4 09/12/2018 1247   CALCIUM 9.5 10/08/2017 0907   ALKPHOS 91 09/12/2018 1247   ALKPHOS 88 10/08/2017 0907   AST 18 09/12/2018 1247   AST 22 10/08/2017 0907   ALT 18 09/12/2018 1247   ALT 24 10/08/2017 0907   BILITOT 0.5 09/12/2018 1247   BILITOT 0.42 10/08/2017 0907       RADIOGRAPHIC STUDIES:  No results found.   ASSESSMENT/PLAN:  Large cell carcinoma of left lung, stage 4 ([facility]) This is a very pleasant 52 year old white male with stage IV non-small cell lung cancer, high-grade neuroendocrine carcinoma presented with locally advanced disease in the chest as well as solitary brain metastasis and 2 liver lesions. The patient is status post stereotactic radiotherapy to the solitary brain metastasis as well as short course of concurrent chemoradiation to the locally advanced disease in the chest. He recently completed 6 cycles of systemic chemotherapy with carboplatin, Alimta and Avastin and tolerated this treatment fairly well. He was started on maintenance treatment with Alimta and Avastin status post 15 cycles.  Avastin was discontinued secondary to intolerance to the combination treatment.  He is currently on treatment with single agent Alimta status post 4 cycles. The patient continues to tolerate this  treatment fairly well with single agent Alimta. I recommended for him to proceed with cycle #20 today as scheduled. I will see him back for follow-up visit in 3 weeks for evaluation before the next cycle of his treatment.  For hypertension, he will continue on his current blood pressure medications.  He checks his blood pressure at home and reports that it is not elevated.  He will follow-up with his primary care provider for ongoing management of his hypertension.  The patient was advised to call immediately if he has any concerning symptoms in the interval. The patient voices understanding of current disease status and treatment options and is in agreement with the current care plan. All questions were answered. The patient knows  to call the clinic with any problems, questions or concerns. We can certainly see the patient much sooner if necessary.   No orders of the defined types were placed in this encounter.    Mikey Bussing, DNP, AGPCNP-BC, AOCNP 09/12/18

## 2018-09-12 NOTE — Telephone Encounter (Signed)
No 12/2 los/referrals/orders

## 2018-09-12 NOTE — Telephone Encounter (Signed)
Patient bypassed scheduling. Appointments schedule per 12/2 los. Spoke with patient re next appointment 12/31 (date per los). Patient will confirm appointments via my chart and get updated schedule at next visit 12/31.

## 2018-09-12 NOTE — Assessment & Plan Note (Addendum)
This is a very pleasant 52 year old white male with stage IV non-small cell lung cancer, high-grade neuroendocrine carcinoma presented with locally advanced disease in the chest as well as solitary brain metastasis and 2 liver lesions. The patient is status post stereotactic radiotherapy to the solitary brain metastasis as well as short course of concurrent chemoradiation to the locally advanced disease in the chest. He recently completed 6 cycles of systemic chemotherapy with carboplatin, Alimta and Avastin and tolerated this treatment fairly well. He was started on maintenance treatment with Alimta and Avastin status post 15 cycles.  Avastin was discontinued secondary to intolerance to the combination treatment.  He is currently on treatment with single agent Alimta status post 4 cycles. The patient continues to tolerate this treatment fairly well with single agent Alimta. I recommended for him to proceed with cycle #20 today as scheduled. I will see him back for follow-up visit in 3 weeks for evaluation before the next cycle of his treatment.  For hypertension, he will continue on his current blood pressure medications.  He checks his blood pressure at home and reports that it is not elevated.  He will follow-up with his primary care provider for ongoing management of his hypertension.  The patient was advised to call immediately if he has any concerning symptoms in the interval. The patient voices understanding of current disease status and treatment options and is in agreement with the current care plan. All questions were answered. The patient knows to call the clinic with any problems, questions or concerns. We can certainly see the patient much sooner if necessary.

## 2018-09-12 NOTE — Patient Instructions (Signed)
[facility] Discharge Instructions for Patients Receiving Chemotherapy  Today you received the following chemotherapy agents Pemetrexed (Alimta).  To help prevent nausea and vomiting after your treatment, we encourage you to take your nausea medication as prescribed.   If you develop nausea and vomiting that is not controlled by your nausea medication, call the clinic.   BELOW ARE SYMPTOMS THAT SHOULD BE REPORTED IMMEDIATELY:  *FEVER GREATER THAN 100.5 F  *CHILLS WITH OR WITHOUT FEVER  NAUSEA AND VOMITING THAT IS NOT CONTROLLED WITH YOUR NAUSEA MEDICATION  *UNUSUAL SHORTNESS OF BREATH  *UNUSUAL BRUISING OR BLEEDING  TENDERNESS IN MOUTH AND THROAT WITH OR WITHOUT PRESENCE OF ULCERS  *URINARY PROBLEMS  *BOWEL PROBLEMS  UNUSUAL RASH Items with * indicate a potential emergency and should be followed up as soon as possible.  Feel free to call the clinic should you have any questions or concerns. The clinic phone number is [phone].  Please show the [facility] at check-in to the Emergency Department and triage nurse.

## 2018-09-14 ENCOUNTER — Other Ambulatory Visit: Payer: Self-pay | Admitting: Radiation Therapy

## 2018-09-14 DIAGNOSIS — C7949 Secondary malignant neoplasm of other parts of nervous system: Principal | ICD-10-CM

## 2018-09-14 DIAGNOSIS — C7931 Secondary malignant neoplasm of brain: Secondary | ICD-10-CM

## 2018-09-17 ENCOUNTER — Other Ambulatory Visit: Payer: 59

## 2018-09-19 ENCOUNTER — Ambulatory Visit: Payer: Self-pay | Admitting: Radiation Oncology

## 2018-09-19 ENCOUNTER — Other Ambulatory Visit: Payer: Self-pay | Admitting: Oncology

## 2018-09-19 ENCOUNTER — Telehealth: Payer: Self-pay | Admitting: *Deleted

## 2018-09-19 ENCOUNTER — Inpatient Hospital Stay (HOSPITAL_BASED_OUTPATIENT_CLINIC_OR_DEPARTMENT_OTHER): Payer: 59 | Admitting: Oncology

## 2018-09-19 VITALS — BP 139/98 | HR 96 | Temp 98.3°F | Resp 20 | Ht 73.0 in | Wt 242.6 lb

## 2018-09-19 DIAGNOSIS — C7A019 Malignant carcinoid tumor of the small intestine, unspecified portion: Secondary | ICD-10-CM | POA: Diagnosis not present

## 2018-09-19 DIAGNOSIS — F419 Anxiety disorder, unspecified: Secondary | ICD-10-CM

## 2018-09-19 DIAGNOSIS — R21 Rash and other nonspecific skin eruption: Secondary | ICD-10-CM | POA: Diagnosis not present

## 2018-09-19 DIAGNOSIS — C7B02 Secondary carcinoid tumors of liver: Secondary | ICD-10-CM

## 2018-09-19 DIAGNOSIS — F32A Depression, unspecified: Secondary | ICD-10-CM

## 2018-09-19 DIAGNOSIS — F329 Major depressive disorder, single episode, unspecified: Secondary | ICD-10-CM

## 2018-09-19 DIAGNOSIS — C7931 Secondary malignant neoplasm of brain: Secondary | ICD-10-CM | POA: Diagnosis not present

## 2018-09-19 MED ORDER — METHYLPREDNISOLONE 4 MG PO TABS: ORAL_TABLET | ORAL | 0 refills | Status: DC

## 2018-09-19 NOTE — Telephone Encounter (Signed)
Wife notified of medrol dose pack and will bring Elta Guadeloupe in to see Sandi Mealy today at 3:00pm. Message to scheduler

## 2018-09-19 NOTE — Telephone Encounter (Signed)
Wife states patient broke out in an itchy rash on stomach, chest and feet over the weekend. Had chills, no fever. Rash and itching are worse at night. Has taken benadryl the past 2 nights. Is taking decadron as instructed.

## 2018-09-19 NOTE — Telephone Encounter (Signed)
I can send in a Medrol dose pack for him. He should also come to [facility] for a visit.

## 2018-09-19 NOTE — Progress Notes (Signed)
medrol 

## 2018-09-19 NOTE — Progress Notes (Signed)
SYMPTOM MANAGEMENT CLINIC    Chief Complaint: Rash  HPI:  Calvin Wagner 52 y.o. 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  He is currently undergoing treatment with single agent Alimta 500 mg/m.  Last dose was given 1 week ago on 09/12/2018.  The patient contacted our office earlier today to report that he had developed a rash starting on 09/15/2018.  He reports that he took his dexamethasone premedication with his last cycle of chemotherapy.  Rash is on his bilateral arms, bilateral legs, chest, and on his feet.  He has been using hydrocortisone cream and Allegra cream.  He used Benadryl at bedtime to help with the itching.  Rash is somewhat better today.  Denies new soaps and detergents.  He denies associated symptoms including fever, chills, chest discomfort, shortness of breath, cough, hemoptysis.  He reports his baseline mild intermittent nausea.  Denies vomiting, constipation, diarrhea.  Denies recent weight loss or night sweats.  The patient's wife also reports that she feels he is more anxious.  He has lorazepam at home but only takes it when he has a scan or on days of treatment.  She feels though his mood is depressed.  The patient was not really willing to talk about these issues today.  The patient is here for evaluation of his rash.  Oncology History   Patient presented with onset of cough then followed with scans.         Cancer ([place]) (Resolved)   11/25/2016 Initial Diagnosis    Cancer ([place])    11/25/2016 Imaging    CTA IMPRESSION: 1. Multiple bilateral acute pulmonary emboli, segmental to subsegmental size. 2. 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 There is complete obstruction of the lingular and left lower lobe airways. 1 cm pulmonary nodule in the left lateral costophrenic sulcus.    11/26/2016 Imaging    CT Abdomen No mass or adenopathy in  the abdomen or pelvis    12/10/2016 Surgery    Operation: Video Flexible fiberoptic bronchoscopy, diagnostic     12/17/2016 Imaging    PET IMPRESSION: 1. Extensive left hilar and mediastinal all hypermetabolic lymphadenopathy, compatible with underlying malignancy. These findings could reflect either primary lymphoma or bronchogenic carcinoma such as a small cell carcinoma. 2. There is also a small left lower lobe pulmonary nodule measuring 11 mm which is mildly hypermetabolic. This could potentially represent the primary bronchogenic neoplasm, but it is un usual for a small nodule of this size to result in such extensive lymphadenopathy.    01/02/2017 Imaging    MRI Brain IMPRESSION:  Positive for a solitary 13 mm rim enhancing metastasis in the left anterior inferior frontal gyrus (series 10, image 72. No associated edema or mass effect.    01/04/2017 -  Radiation Therapy    SIM Chest     01/11/2017 -  Chemotherapy    The patient had palonosetron (ALOXI) injection 0.25 mg, 0.25 mg, Intravenous,  Once, 1 of 7 cycles  CARBOplatin (PARAPLATIN) 280 mg in sodium chloride 0.9 % 250 mL chemo infusion, 280 mg (100 % of original dose 281.6 mg), Intravenous,  Once, 1 of 7 cycles Dose modification: 281.6 mg (original dose 281.6 mg, Cycle 1)  PACLitaxel (TAXOL) 102 mg in dextrose 5 % 250 mL chemo infusion ( for chemotherapy treatment.       Review of Systems  Constitutional: Negative for chills, fever, malaise/fatigue and weight loss.  HENT:  Negative.   Eyes: Negative.   Respiratory: Negative.   Cardiovascular: Negative.   Gastrointestinal: Negative.   Genitourinary: Negative.   Musculoskeletal: Negative.   Skin: Positive for itching and rash.  Neurological: Negative.   Endo/Heme/Allergies: Negative.   Psychiatric/Behavioral: Positive for depression. The patient is nervous/anxious.        Patient's wife reported anxiety and depression.     Past Medical History:  Diagnosis Date    . Encounter for antineoplastic chemotherapy 12/31/2016  . GERD 11/08/2009   Qualifier: Diagnosis of  By: Ronnald Ramp MD, Arvid Right Goals of care, counseling/discussion 12/31/2016  . History of radiation therapy 01/13/2017 - 02/02/2017   Site/dose:  Left lung: 30 Gy in 15 fractions  . HYPERTENSION 11/08/2009   Qualifier: Diagnosis of  By: Ronnald Ramp MD, Arvid Right.   . 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 Migraine 02/25/2012  . Pneumonia   . PONV (postoperative nausea and vomiting)   . Rotator cuff tear, left     Past Surgical History:  Procedure Laterality Date  . INGUINAL HERNIA REPAIR    . SHOULDER ARTHROSCOPY WITH ROTATOR CUFF REPAIR Left 11/12/2016   Procedure: SHOULDER ARTHROSCOPY WITH ROTATOR CUFF REPAIR AND SUBACROMIAL DECOMPRESSION;  Surgeon: Tania Ade, MD;  Location: [facility];  Service: Orthopedics;  Laterality: Left;  SHOULDER ARTHROSCOPY WITH ROTATOR CUFF REPAIR AND SUBACROMIAL DECOMPRESSION  . TONSILLECTOMY    . TOTAL KNEE ARTHROPLASTY    . VIDEO BRONCHOSCOPY Bilateral 12/10/2016   Procedure: VIDEO BRONCHOSCOPY WITHOUT FLUORO;  Surgeon: Tanda Rockers, MD;  Location: [facility];  Service: Cardiopulmonary;  Laterality: Bilateral;    has Essential hypertension; GERD; Other abnormal glucose; Chest pain; Alcohol dependence ([place]); Abdominal pain; Chest pain, atypical; Chest pain at rest; Headache; Worsening headaches; Vision changes; Snoring; Uncontrolled morning headache; Excessive somnolence disorder; Dizziness; Nausea with vomiting; Cough; Pulmonary infiltrates on CXR; Asthmatic bronchitis , chronic ([place]); Lung nodule; Dyspnea; Bronchial obstruction; Obstructive pneumonia; Pulmonary embolus ([place]); Large cell carcinoma of left lung, stage 4 ([place]); Goals of care, counseling/discussion; Encounter for antineoplastic chemotherapy; Hypersensitivity reaction; Long term current use of anticoagulant therapy; and Brain metastasis ([place]) on their problem list.    has No Known  Allergies.  Allergies as of 09/19/2018   No Known Allergies     Medication List        Accurate as of 09/19/18  2:47 PM. Always use your most recent med list.          albuterol 108 (90 Base) MCG/ACT inhaler Commonly known as:  PROVENTIL HFA;VENTOLIN HFA Inhale 2 puffs into the lungs every 4 (four) hours as needed.   PROAIR HFA 108 (90 Base) MCG/ACT inhaler Generic drug:  albuterol Inhale 2 puffs into the lungs every 4 (four) hours as needed.   amLODipine 5 MG tablet Commonly known as:  NORVASC Take 5 mg by mouth daily.   BUDESONIDE PO Take by mouth. Budesonide slurry   budesonide-formoterol 80-4.5 MCG/ACT inhaler Commonly known as:  SYMBICORT Inhale 2 puffs into the lungs 2 (two) times daily.   chlorpheniramine-HYDROcodone 10-8 MG/5ML Suer Commonly known as:  TUSSIONEX Take 5 mLs by mouth every 12 (twelve) hours as needed for cough.   dexamethasone 4 MG tablet Commonly known as:  DECADRON 4 mg by mouth twice a day the day before, day of and day after the chemotherapy   folic acid 1 MG tablet Commonly known as:  FOLVITE Take 1 tablet (1 mg total) by mouth daily.  furosemide 20 MG tablet Commonly known as:  LASIX TAKE 1 TABLET BY MOUTH EVERY DAY AS NEEDED   LORazepam 0.5 MG tablet Commonly known as:  ATIVAN Take 1 tablet (0.5 mg total) by mouth daily as needed for anxiety (anxiety).   losartan 50 MG tablet Commonly known as:  COZAAR Take 50 mg by mouth daily.   magic mouthwash Soln Take 5 mLs by mouth 4 (four) times daily as needed for mouth pain. 1:1:1 mix-Nystatin. Benadryl and extra strength maalox.   magic mouthwash Soln SWISH AND SPIT OR SWALLOW 5MLS 4 TIMES A DAY AS NEEDED   methylPREDNISolone 4 MG tablet Commonly known as:  MEDROL Take 6 tabs on day 1, 5 tabs on day 2, 4 tabs on day 3, 3 tabs on day 4, 2 tabs on day 5, 1 tab on day 6, and then stop   multivitamin with minerals Tabs tablet Take 1 tablet by mouth daily.   ondansetron 8 MG  tablet Commonly known as:  ZOFRAN TAKE 1 TABLET BY MOUTH EVERY 8 HOURS AS NEEDED FOR NAUSEA OR VOMITING.   ondansetron 8 MG tablet Commonly known as:  ZOFRAN TAKE 1 TABLET BY MOUTH EVERY 8 HOURS AS NEEDED FOR NAUSEA OR VOMITING.   pantoprazole 40 MG tablet Commonly known as:  PROTONIX Take 40 mg by mouth daily.   prochlorperazine 10 MG tablet Commonly known as:  COMPAZINE TAKE 1 TABLET BY MOUTH  EVERY 6 HOURS AS NEEDED FOR NAUSEA OR VOMITING   RA BUDESONIDE 32 MCG/ACT nasal spray Generic drug:  budesonide Place into both nostrils daily.   traMADol 50 MG tablet Commonly known as:  ULTRAM TAKE 1 TABLET BY MOUTH EVERY 12 HOURS AS NEEDED   XARELTO 20 MG Tabs tablet Generic drug:  rivaroxaban TAKE 1 TABLET EVERY DAY WITH SUPPER        PHYSICAL EXAMINATION  Oncology Vitals 09/19/2018 09/12/2018  Height 185 cm -  Weight 110.043 kg -  Weight (lbs) 242 lbs 10 oz -  BMI (kg/m2) 32.01 kg/m2 -  Temp 98.3 -  Pulse 96 98  Resp 20 -  SpO2 97 -  BSA (m2) 2.38 m2 -   BP Readings from Last 2 Encounters:  09/19/18 (!) 139/98  09/12/18 (!) 149/87    Physical Exam  Constitutional: He is oriented to person, place, and time. He appears well-developed and well-nourished.  HENT:  Head: Normocephalic and atraumatic.  Mouth/Throat: No oropharyngeal exudate.  Eyes: Conjunctivae are normal. Right eye exhibits no discharge. Left eye exhibits no discharge. No scleral icterus.  Neck: Normal range of motion. Neck supple.  Cardiovascular: Normal rate, regular rhythm, normal heart sounds and intact distal pulses.  Trace bilateral pedal edema noted.  Pulmonary/Chest: Effort normal and breath sounds normal. No respiratory distress. He has no wheezes.  Abdominal: Soft. Bowel sounds are normal. He exhibits no distension. There is no tenderness.  Musculoskeletal: Normal range of motion.  Lymphadenopathy:    He has no cervical adenopathy.  Neurological: He is alert and oriented to person, place,  and time. He exhibits normal muscle tone. Coordination normal.  Skin: Skin is warm and dry. Rash noted. He is not diaphoretic. There is erythema.  Psychiatric: He has a normal mood and affect. His behavior is normal. Judgment and thought content normal.          LABORATORY DATA:. No visits with results within 3 Day(s) from this visit.  Latest known visit with results is:  Appointment on 09/12/2018  Component Date Value  Ref Range Status  . Sodium 09/12/2018 134* 135 - 145 mmol/L Final  . Potassium 09/12/2018 4.4  3.5 - 5.1 mmol/L Final  . Chloride 09/12/2018 102  98 - 111 mmol/L Final  . CO2 09/12/2018 22  22 - 32 mmol/L Final  . Glucose, Bld 09/12/2018 231* 70 - 99 mg/dL Final  . BUN 09/12/2018 10  6 - 20 mg/dL Final  . Creatinine 09/12/2018 1.35* 0.61 - 1.24 mg/dL Final  . Calcium 09/12/2018 9.4  8.9 - 10.3 mg/dL Final  . Total Protein 09/12/2018 7.5  6.5 - 8.1 g/dL Final  . Albumin 09/12/2018 3.6  3.5 - 5.0 g/dL Final  . AST 09/12/2018 18  15 - 41 U/L Final  . ALT 09/12/2018 18  0 - 44 U/L Final  . Alkaline Phosphatase 09/12/2018 91  38 - 126 U/L Final  . Total Bilirubin 09/12/2018 0.5  0.3 - 1.2 mg/dL Final  . GFR, Est Non Af Am 09/12/2018 >60  >60 mL/min Final  . GFR, Est AFR Am 09/12/2018 >60  >60 mL/min Final  . Anion gap 09/12/2018 10  5 - 15 Final   Performed at [facility], [place] [address], [place] [address]  . WBC Count 09/12/2018 6.4  4.0 - 10.5 K/uL Final  . RBC 09/12/2018 4.01* 4.22 - 5.81 MIL/uL Final  . Hemoglobin 09/12/2018 15.2  13.0 - 17.0 g/dL Final  . HCT 09/12/2018 44.7  39.0 - 52.0 % Final  . MCV 09/12/2018 111.5* 80.0 - 100.0 fL Final  . MCH 09/12/2018 37.9* 26.0 - 34.0 pg Final  . MCHC 09/12/2018 34.0  30.0 - 36.0 g/dL Final  . RDW 09/12/2018 15.9* 11.5 - 15.5 % Final  . Platelet Count 09/12/2018 227  150 - 400 K/uL Final  . nRBC 09/12/2018 0.0  0.0 - 0.2 % Final  . Neutrophils Relative % 09/12/2018 88  % Final  .  Neutro Abs 09/12/2018 5.7  1.7 - 7.7 K/uL Final  . Lymphocytes Relative 09/12/2018 6  % Final  . Lymphs Abs 09/12/2018 0.4* 0.7 - 4.0 K/uL Final  . Monocytes Relative 09/12/2018 5  % Final  . Monocytes Absolute 09/12/2018 0.3  0.1 - 1.0 K/uL Final  . Eosinophils Relative 09/12/2018 0  % Final  . Eosinophils Absolute 09/12/2018 0.0  0.0 - 0.5 K/uL Final  . Basophils Relative 09/12/2018 0  % Final  . Basophils Absolute 09/12/2018 0.0  0.0 - 0.1 K/uL Final  . Immature Granulocytes 09/12/2018 1  % Final  . Abs Immature Granulocytes 09/12/2018 0.03  0.00 - 0.07 K/uL Final   Performed at [facility], [place] [address], [place], [place] [address]    RADIOGRAPHIC STUDIES: No results found.  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  Last dose given on 09/12/2018.  He developed a rash approximately 3 days after his last dose of chemotherapy.  He took his dexamethasone premedication with his last cycle of chemotherapy.  Rash improving.  I have discussed with the patient that rash can be a side effect of Alimta.  He had no other symptoms indicative of an infusion reaction including shortness of breath or chest discomfort.  Recommend a Medrol Dosepak and use of antihistamines.  Benadryl makes him sedated and I have offered him prescription for either Atarax  or to use over-the-counter Zyrtec.  The patient prefers to use Zyrtec.  We discussed continued use of hydrocortisone cream.  I will reach out to the pharmacist to see if any additional steroids will be needed around the timing of his next cycle of chemotherapy.  Will update Dr. Julien Nordmann regarding his rash.  The patient's wife expresses that he is more anxious and depressed.  I have explained to the patient that he may use  his Lorazepam once a day as needed.  He is not taking this on a regular basis.  We also talked about the fact that there are other medications to help control depression anxiety such as Lexapro or Celexa.  The patient was not interested in discussing this further today.  He was advised to let us know if he changes his mind and we can consider giving him prescription for Lexapro or Celexa.  Patient stated understanding of all instructions; and was in agreement with this plan of care. The patient knows to call the clinic with any problems, questions or concerns.   The patient will keep his follow-up after the Christmas holiday for evaluation prior to his next cycle of chemotherapy.  Mikey Bussing, NP 09/19/2018

## 2018-09-21 ENCOUNTER — Ambulatory Visit (HOSPITAL_COMMUNITY)
Admission: RE | Admit: 2018-09-21 | Discharge: 2018-09-21 | Disposition: A | Discharge status: Home / Self Care | Payer: 59 | Source: Ambulatory Visit | Attending: Radiation Oncology | Admitting: Radiation Oncology

## 2018-09-21 ENCOUNTER — Other Ambulatory Visit: Payer: 59

## 2018-09-21 DIAGNOSIS — C7931 Secondary malignant neoplasm of brain: Secondary | ICD-10-CM | POA: Insufficient documentation

## 2018-09-21 DIAGNOSIS — C801 Malignant (primary) neoplasm, unspecified: Secondary | ICD-10-CM | POA: Diagnosis not present

## 2018-09-21 DIAGNOSIS — C7949 Secondary malignant neoplasm of other parts of nervous system: Secondary | ICD-10-CM | POA: Diagnosis not present

## 2018-09-21 MED ORDER — GADOBUTROL 1 MMOL/ML IV SOLN
10.0000 mL | Freq: Once | INTRAVENOUS | Status: AC | PRN
  Administered 2018-09-21: 10 mL via INTRAVENOUS

## 2018-09-22 ENCOUNTER — Other Ambulatory Visit: Payer: Self-pay | Admitting: Radiation Therapy

## 2018-09-26 ENCOUNTER — Inpatient Hospital Stay: Payer: 59

## 2018-09-26 ENCOUNTER — Encounter: Payer: Self-pay | Admitting: Genetic Counselor

## 2018-09-26 ENCOUNTER — Telehealth: Payer: Self-pay | Admitting: Radiation Oncology

## 2018-09-26 NOTE — Telephone Encounter (Signed)
I called and let the patient's wife know the MRI findings. We will meet back Wednesday to review options of whole brain radiotherapy.

## 2018-09-27 ENCOUNTER — Telehealth: Payer: Self-pay | Admitting: Medical Oncology

## 2018-09-27 ENCOUNTER — Ambulatory Visit: Payer: 59 | Admitting: Internal Medicine

## 2018-09-27 ENCOUNTER — Other Ambulatory Visit: Payer: Self-pay | Admitting: Medical Oncology

## 2018-09-27 ENCOUNTER — Telehealth: Payer: Self-pay | Admitting: Radiation Oncology

## 2018-09-27 ENCOUNTER — Ambulatory Visit: Payer: 59

## 2018-09-27 ENCOUNTER — Other Ambulatory Visit: Payer: 59

## 2018-09-27 DIAGNOSIS — I2782 Chronic pulmonary embolism: Secondary | ICD-10-CM

## 2018-09-27 DIAGNOSIS — F419 Anxiety disorder, unspecified: Secondary | ICD-10-CM

## 2018-09-27 DIAGNOSIS — C3492 Malignant neoplasm of unspecified part of left bronchus or lung: Secondary | ICD-10-CM

## 2018-09-27 DIAGNOSIS — I2609 Other pulmonary embolism with acute cor pulmonale: Secondary | ICD-10-CM

## 2018-09-27 MED ORDER — LORAZEPAM 0.5 MG PO TABS: 0.5000 mg | ORAL_TABLET | Freq: Every day | ORAL | 0 refills | Status: DC | PRN

## 2018-09-27 NOTE — Telephone Encounter (Signed)
LM for the patient's wife to call us back. She had left a message earlier today.

## 2018-09-27 NOTE — Telephone Encounter (Signed)
called in ativan refill.

## 2018-09-28 ENCOUNTER — Ambulatory Visit
Admission: RE | Admit: 2018-09-28 | Discharge: 2018-09-28 | Disposition: A | Discharge status: Home / Self Care | Payer: 59 | Source: Ambulatory Visit | Attending: Radiation Oncology | Admitting: Radiation Oncology

## 2018-09-28 ENCOUNTER — Other Ambulatory Visit: Payer: Self-pay

## 2018-09-28 ENCOUNTER — Encounter: Payer: Self-pay | Admitting: Radiation Oncology

## 2018-09-28 VITALS — BP 140/95 | HR 115 | Temp 98.7°F | Resp 20 | Ht 73.0 in | Wt 244.0 lb

## 2018-09-28 DIAGNOSIS — C7931 Secondary malignant neoplasm of brain: Secondary | ICD-10-CM | POA: Insufficient documentation

## 2018-09-28 DIAGNOSIS — C787 Secondary malignant neoplasm of liver and intrahepatic bile duct: Secondary | ICD-10-CM | POA: Diagnosis not present

## 2018-09-28 DIAGNOSIS — F419 Anxiety disorder, unspecified: Secondary | ICD-10-CM | POA: Insufficient documentation

## 2018-09-28 DIAGNOSIS — Z923 Personal history of irradiation: Secondary | ICD-10-CM | POA: Diagnosis not present

## 2018-09-28 DIAGNOSIS — C3492 Malignant neoplasm of unspecified part of left bronchus or lung: Secondary | ICD-10-CM | POA: Insufficient documentation

## 2018-09-28 DIAGNOSIS — K929 Disease of digestive system, unspecified: Secondary | ICD-10-CM | POA: Diagnosis not present

## 2018-09-28 DIAGNOSIS — Z85841 Personal history of malignant neoplasm of brain: Secondary | ICD-10-CM | POA: Diagnosis not present

## 2018-09-28 MED ORDER — LORAZEPAM 1 MG PO TABS: 1.0000 mg | ORAL_TABLET | ORAL | 0 refills | Status: DC | PRN

## 2018-09-28 NOTE — Progress Notes (Signed)
  Radiation Oncology         [phone]) [phone] ________________________________  Name: RENNER SEBALD MRN: [id number]  Date: 09/28/2018  DOB: 04/09/1966    Simulation and treatment planning note  DIAGNOSIS:     ICD-10-CM   1. Brain metastasis ([place]) C79.31      The patient presented for simulation for the patient's upcoming course of whole brain radiation treatment. The patient was placed in a supine position and a customized thermoplastic head cast was constructed to aid in patient immobilization during the treatment. This complex treatment device will be used on a daily basis. In this fashion a CT scan was obtained through the head and neck region and isocenter was placed near midline within the brain.  The patient will be planned to receive a course of whole brain radiation treatment to a dose of 30 gray in 10 fractions at 3 gray per fraction. To accomplish this, 2 customized blocks have been designed which corresponds to left and right whole brain radiation fields. These 2 complex treatment devices will be used on a daily basis during the course of radiation. A complex isodose plan is requested to insure that the target area is adequately covered in to facilitate optimization of the treatment plan. A forward planning technique will also be evaluated to determine if this approach significantly improves the plan.   ________________________________   Jodelle Gross, MD, PhD

## 2018-09-28 NOTE — Progress Notes (Signed)
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  Interval Since Last Radiation: 1 year, 8 months  01/13/17 - 02/02/17: 1. Left lung: 30 Gy in 15 fractions  01/25/17 SRS Treatment: 1. PTV1 Left Frontal 43mm: 20 Gy in 1 fraction   Narrative:  Mr. Neilan is a pleasant, 52 y.o. gentleman who was diagnosed with stage IV lung cancer in the spring of 2018. He received SRS to a left frontal lesion and he has responded well to this with complete resolution noted on imaging since 09/2017. He has also received radiotherapy to the lung, and has been continued on systemic therapy as well and is currently receiving Alimta. MRI of the brain on 04/28/18 which did not reveal new or persistent disease, and he had a recent scan on 09/21/18 that revealed 11 new metastatic lesions, the largest measured 6 mm. He comes today to discuss treatment recommendations. Of note he is scheduled for Alimta on 10/11/18, and during brain oncology conference, it was recommended that he proceed with whole brain radiation. Dr. Julien Nordmann would hold his Alimta an extra week so we can proceed.  On review of systems, the patient reports that he is doing okay. He has anticipatory nausea on a regular basis when he comes to the cancer center but denies any new or progressive nausea. He denies headaches, seizure activity, visual or speech changes. He denies any chest pain, shortness of breath, cough, fevers, chills, night sweats, unintended weight changes. He reports improvement in his GI symptoms since taking PPIs. He denies any bowel or bladder disturbances, and denies abdominal pain, nausea or  vomiting. He denies any new musculoskeletal or joint aches or pains, new skin lesions or concerns. A complete review of systems is obtained and is otherwise negative.       Past Medical History:  Past Medical History:  Diagnosis Date  . Encounter for antineoplastic chemotherapy 12/31/2016  . GERD 11/08/2009   Qualifier: Diagnosis of  By: Ronnald Ramp MD, Arvid Right Goals of care, counseling/discussion 12/31/2016  . History of radiation therapy 01/13/2017 - 02/02/2017   Site/dose:  Left lung: 30 Gy in 15 fractions  . HYPERTENSION 11/08/2009   Qualifier: Diagnosis of  By: Ronnald Ramp MD, Arvid Right.   . 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 Migraine 02/25/2012  . Pneumonia   . PONV (postoperative nausea and vomiting)   . Rotator cuff tear, left     Past Surgical History: Past Surgical History:  Procedure Laterality Date  . INGUINAL HERNIA REPAIR    . SHOULDER ARTHROSCOPY WITH ROTATOR CUFF REPAIR Left 11/12/2016   Procedure: SHOULDER ARTHROSCOPY WITH ROTATOR CUFF REPAIR AND SUBACROMIAL DECOMPRESSION;  Surgeon: Tania Ade, MD;  Location: [facility];  Service: Orthopedics;  Laterality: Left;  SHOULDER ARTHROSCOPY WITH ROTATOR CUFF REPAIR AND SUBACROMIAL DECOMPRESSION  . TONSILLECTOMY    . TOTAL KNEE ARTHROPLASTY    . VIDEO BRONCHOSCOPY Bilateral 12/10/2016   Procedure: VIDEO BRONCHOSCOPY WITHOUT FLUORO;  Surgeon: Tanda Rockers, MD;  Location: [facility];  Service: Cardiopulmonary;  Laterality: Bilateral;    Social History:  Social History   Socioeconomic History  . Marital  status: Married    Spouse name: Baker Janus  . Number of children: 2  . Years of education: Not on file  . Highest education level: Not on file  Occupational History  . Occupation: Psychologist, occupational: [employer]  Social Needs  . Financial resource strain: Not on file  . Food insecurity:    Worry: Not on file    Inability: Not on file  . Transportation needs:    Medical: Not on file     Non-medical: Not on file  Tobacco Use  . Smoking status: Never Smoker  . Smokeless tobacco: Never Used  Substance and Sexual Activity  . Alcohol use: Yes    Comment: social  . Drug use: No  . Sexual activity: Not Currently  Lifestyle  . Physical activity:    Days per week: Not on file    Minutes per session: Not on file  . Stress: Not on file  Relationships  . Social connections:    Talks on phone: Not on file    Gets together: Not on file    Attends religious service: Not on file    Active member of club or organization: Not on file    Attends meetings of clubs or organizations: Not on file    Relationship status: Not on file  . Intimate partner violence:    Fear of current or ex partner: Not on file    Emotionally abused: Not on file    Physically abused: Not on file    Forced sexual activity: Not on file  Other Topics Concern  . Not on file  Social History Narrative   Lives at home wife and 2 kids   Caffeine use: none    The patient is on disability and is no longer working at [employer]. He lives in [place] and is accompanied by his wife.  Family History: Family History  Problem Relation Age of Onset  . Heart disease Mother 46  . Lung cancer Other        family history  . CAD Other        strong family history male and male <50  . Mental retardation Other   . Heart attack Brother 6  . Alcohol abuse Neg Hx   . Diabetes Neg Hx   . Early death Neg Hx   . Hyperlipidemia Neg Hx   . Hypertension Neg Hx   . Kidney disease Neg Hx   . Stroke Neg Hx   . Migraines Neg Hx                   ALLERGIES:  has No Known Allergies.  Meds: Current Outpatient Medications  Medication Sig Dispense Refill  . albuterol (PROVENTIL HFA;VENTOLIN HFA) 108 (90 Base) MCG/ACT inhaler Inhale 2 puffs into the lungs every 4 (four) hours as needed.    Marland Kitchen amLODipine (NORVASC) 5 MG tablet Take 5 mg by mouth daily.    . budesonide-formoterol (SYMBICORT) 80-4.5 MCG/ACT inhaler Inhale  2 puffs into the lungs 2 (two) times daily. 1 Inhaler 5  . dexamethasone (DECADRON) 4 MG tablet 4 mg by mouth twice a day the day before, day of and day after the chemotherapy 40 tablet 1  . folic acid (FOLVITE) 1 MG tablet Take 1 tablet (1 mg total) by mouth daily. 30 tablet 4  . losartan (COZAAR) 50 MG tablet Take 50 mg by mouth daily.    . magic mouthwash SOLN Take 5 mLs by  mouth 4 (four) times daily as needed for mouth pain. 1:1:1 mix-Nystatin. Benadryl and extra strength maalox. 240 mL 0  . magic mouthwash SOLN SWISH AND SPIT OR SWALLOW 5MLS 4 TIMES A DAY AS NEEDED  0  . Multiple Vitamin (MULTIVITAMIN WITH MINERALS) TABS tablet Take 1 tablet by mouth daily.    . ondansetron (ZOFRAN) 8 MG tablet TAKE 1 TABLET BY MOUTH EVERY 8 HOURS AS NEEDED FOR NAUSEA OR VOMITING. 30 tablet 1  . ondansetron (ZOFRAN) 8 MG tablet TAKE 1 TABLET BY MOUTH EVERY 8 HOURS AS NEEDED FOR NAUSEA OR VOMITING. 20 tablet 0  . pantoprazole (PROTONIX) 40 MG tablet Take 40 mg by mouth daily.  2  . PROAIR HFA 108 (90 Base) MCG/ACT inhaler Inhale 2 puffs into the lungs every 4 (four) hours as needed.  5  . prochlorperazine (COMPAZINE) 10 MG tablet TAKE 1 TABLET BY MOUTH  EVERY 6 HOURS AS NEEDED FOR NAUSEA OR VOMITING 30 tablet 0  . XARELTO 20 MG TABS tablet TAKE 1 TABLET EVERY DAY WITH SUPPER 90 tablet 0  . budesonide (RA BUDESONIDE) 32 MCG/ACT nasal spray Place into both nostrils daily.    . BUDESONIDE PO Take by mouth. Budesonide slurry    . chlorpheniramine-HYDROcodone (TUSSIONEX) 10-8 MG/5ML SUER Take 5 mLs by mouth every 12 (twelve) hours as needed for cough. (Patient not taking: Reported on 09/28/2018) 473 mL 0  . furosemide (LASIX) 20 MG tablet TAKE 1 TABLET BY MOUTH EVERY DAY AS NEEDED (Patient not taking: Reported on 09/28/2018) 20 tablet 0  . LORazepam (ATIVAN) 1 MG tablet Take 1-1.5 tablets (1-1.5 mg total) by mouth every 4 (four) hours as needed for anxiety (30 minutes prior to radiotherapy or MRI scans). 60 tablet 0    . methylPREDNISolone (MEDROL) 4 MG tablet Take 6 tabs on day 1, 5 tabs on day 2, 4 tabs on day 3, 3 tabs on day 4, 2 tabs on day 5, 1 tab on day 6, and then stop (Patient not taking: Reported on 09/28/2018) 21 tablet 0  . traMADol (ULTRAM) 50 MG tablet TAKE 1 TABLET BY MOUTH EVERY 12 HOURS AS NEEDED (Patient not taking: Reported on 09/28/2018) 30 tablet 0   No current facility-administered medications for this encounter.     Physical Findings:  height is 6\' 1"  (1.854 m) and weight is 244 lb (110.7 kg). His oral temperature is 98.7 F (37.1 C). His blood pressure is 140/95 (abnormal) and his pulse is 115 (abnormal). His respiration is 20 and oxygen saturation is 100%.  Pain Assessment Pain Score: 2  Pain Loc: Abdomen/10 In general this is a well appearing caucasian male in no acute distress. He's alert and oriented x4 and appropriate throughout the examination. Cardiopulmonary assessment is negative for acute distress and he exhibits normal effort. He appears to be neurologically intact without focal deficits.    Lab Findings: Lab Results  Component Value Date   WBC 6.4 09/12/2018   HGB 15.2 09/12/2018   HCT 44.7 09/12/2018   MCV 111.5 (H) 09/12/2018   PLT 227 09/12/2018     Radiographic Findings: Mr Jeri Cos RW Contrast  Result Date: 09/21/2018 CLINICAL DATA:  History of single brain metastasis.  Follow-up. EXAM: MRI HEAD WITHOUT AND WITH CONTRAST TECHNIQUE: Multiplanar, multiecho pulse sequences of the brain and surrounding structures were obtained without and with intravenous contrast. CONTRAST:  10 cc Gadavist intravenous COMPARISON:  Brain MRI 04/28/2018 FINDINGS: BRAIN New Lesions: 1. 5 mm enhancing lesion located along the superficial  right temporal seen on 11:66. 2. Punctate enhancing lesion located in the left pons seen on 11:46 3. 4 mm enhancing lesion along the right posterior cerebral cortex on 11: 85 4. 3 mm enhancing lesion along the left parietal cortex seen on 11:132.  5. Punctate enhancing left frontal white matter lesion seen on 11:114 6. Punctate enhancing lesion along the lower left parietal cortex on 11:113 7. Punctate enhancing lesion along posterior right frontal cortex on 11:123 8. 6 mm enhancing lesion along the right frontal operculum on 11:86 9. Punctate enhancing lesion in the right posterior parietal subcortical white matter on 11:106, convincing on coronal postcontrast imaging 10. Punctate parasagittal left frontal metastasis on 11:115, convincing on coronal postcontrast imaging 11. Punctate enhancing lesion along the superficial left temporal lobe, convincing on reformats, seen on 11:51 Larger lesions: None. Stable or Smaller lesions: None. A previously treated left inferior frontal metastasis is still nonenhancing. Other Brain findings: No acute infarct, hemorrhage, hydrocephalus, shift, or edema. Vascular: Major flow voids are preserved Skull and upper cervical spine: No focal marrow lesion. Sinuses/Orbits: Negative Other: These results will be called to the ordering clinician or representative by the Radiologist Assistant, These results will be called to the ordering clinician or representative by the Radiologist Assistant, and communication documented in the PACS or zVision Dashboard. IMPRESSION: 1. 11 new subcentimeter brain metastases as described above. 2. Single previously treated left frontal metastasis remains nonenhancing. Electronically Signed   By: Monte Fantasia M.D.   On: 09/21/2018 15:28    Impression/Plan: 1. 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 Dr. Lisbeth Renshaw discusses the radiologic findings. He is counseled on the findings and that his case was discussed in conference and recommendations were to proceed with whole brain radiotherapy. We discussed the risks, benefits, short, and long term effects of  radiotherapy, and the patient is interested in proceeding. Dr. Lisbeth Renshaw discusses the delivery and logistics of radiotherapy and anticipates a course of 2 weeks of radiotherapy. Written consent is obtained and placed in the chart, a copy was provided to the patient. He will simulate today following his appointment. We will plan to start treatment tomorrow.  2. GI concerns. This has resolved with PPI therapy. We will follow this expectantly. 3. Anxiety. A new Rx for Ativan 1 mg q 4-6 hours or 30 minutes prior to radiotherapy. This was sent to his pharmacy.  In a visit lasting 45 minutes, greater than 50% of the time was spent face to face discussing his case, and coordinating the patient's care.    Carola Rhine, PAC

## 2018-09-29 ENCOUNTER — Ambulatory Visit
Admission: RE | Admit: 2018-09-29 | Discharge: 2018-09-29 | Disposition: A | Discharge status: Home / Self Care | Payer: 59 | Source: Ambulatory Visit | Attending: Radiation Oncology | Admitting: Radiation Oncology

## 2018-09-29 DIAGNOSIS — C7931 Secondary malignant neoplasm of brain: Secondary | ICD-10-CM | POA: Diagnosis not present

## 2018-09-29 DIAGNOSIS — C3492 Malignant neoplasm of unspecified part of left bronchus or lung: Secondary | ICD-10-CM | POA: Diagnosis not present

## 2018-09-29 NOTE — Progress Notes (Signed)
Brain and Spine Tumor Board Documentation  Calvin Wagner was presented by Cecil Cobbs, MD at Brain and Spine Tumor Board on 09/29/2018, which included representatives from neuro oncology, radiation oncology, surgical oncology, radiology, pathology, navigation.  Calvin Wagner was presented as a current patient with history of the following treatments: adjuvant radiation.  Additionally, we reviewed previous medical and familial history, history of present illness, and recent lab results along with all available histopathologic and imaging studies. The tumor board considered available treatment options and made the following recommendations:  Radiation therapy (primary modality) Multiple new metastases, rec WBRT vs SRS (TBD)  Tumor board is a meeting of clinicians from various specialty areas who evaluate and discuss patients for whom a multidisciplinary approach is being considered. Final determinations in the plan of care are those of the provider(s). The responsibility for follow up of recommendations given during tumor board is that of the provider.   Today's extended care, comprehensive team conference, Calvin Wagner was not present for the discussion and was not examined.

## 2018-09-30 ENCOUNTER — Ambulatory Visit
Admission: RE | Admit: 2018-09-30 | Discharge: 2018-09-30 | Disposition: A | Discharge status: Home / Self Care | Payer: 59 | Source: Ambulatory Visit | Attending: Radiation Oncology | Admitting: Radiation Oncology

## 2018-09-30 DIAGNOSIS — C7931 Secondary malignant neoplasm of brain: Secondary | ICD-10-CM | POA: Diagnosis not present

## 2018-10-03 ENCOUNTER — Ambulatory Visit
Admission: RE | Admit: 2018-10-03 | Discharge: 2018-10-03 | Disposition: A | Discharge status: Home / Self Care | Payer: 59 | Source: Ambulatory Visit | Attending: Radiation Oncology | Admitting: Radiation Oncology

## 2018-10-03 ENCOUNTER — Other Ambulatory Visit: Payer: Self-pay | Admitting: Radiation Oncology

## 2018-10-03 ENCOUNTER — Telehealth: Payer: Self-pay | Admitting: Internal Medicine

## 2018-10-03 DIAGNOSIS — C7931 Secondary malignant neoplasm of brain: Secondary | ICD-10-CM | POA: Diagnosis not present

## 2018-10-03 NOTE — Progress Notes (Signed)
I spoke with therapy to recommend 2 mg BID of dexamethasone due to increasing nausea and vomiting as well as worsened headaches. He will be encouraged to take this and continue protonix. We will taper this following treatment.

## 2018-10-03 NOTE — Telephone Encounter (Signed)
Mm PM PAL - moved 12/31 f/u to APP in the AM. Left message for patient. Schedule mailed.

## 2018-10-04 ENCOUNTER — Ambulatory Visit
Admission: RE | Admit: 2018-10-04 | Discharge: 2018-10-04 | Disposition: A | Discharge status: Home / Self Care | Payer: 59 | Source: Ambulatory Visit | Attending: Radiation Oncology | Admitting: Radiation Oncology

## 2018-10-04 ENCOUNTER — Telehealth: Payer: Self-pay | Admitting: Radiation Oncology

## 2018-10-04 DIAGNOSIS — C7931 Secondary malignant neoplasm of brain: Secondary | ICD-10-CM | POA: Diagnosis not present

## 2018-10-04 NOTE — Telephone Encounter (Signed)
I called the patient's wife but had to leave a message to see how he is doing. He had been having increased nausea and we started dexamethasone 2 mg BID to see if this would help yesterday. He has Zofran, Compazine, and Ativan that can be used for prn relief and we discussed options for scheduling Zofran, prn compazine, and prn Ativan for these symptoms. I also left the pt a message.

## 2018-10-06 ENCOUNTER — Ambulatory Visit
Admission: RE | Admit: 2018-10-06 | Discharge: 2018-10-06 | Disposition: A | Discharge status: Home / Self Care | Payer: 59 | Source: Ambulatory Visit | Attending: Radiation Oncology | Admitting: Radiation Oncology

## 2018-10-06 DIAGNOSIS — C7931 Secondary malignant neoplasm of brain: Secondary | ICD-10-CM | POA: Diagnosis not present

## 2018-10-06 DIAGNOSIS — C3492 Malignant neoplasm of unspecified part of left bronchus or lung: Secondary | ICD-10-CM | POA: Diagnosis not present

## 2018-10-07 ENCOUNTER — Ambulatory Visit
Admission: RE | Admit: 2018-10-07 | Discharge: 2018-10-07 | Disposition: A | Discharge status: Home / Self Care | Payer: 59 | Source: Ambulatory Visit | Attending: Radiation Oncology | Admitting: Radiation Oncology

## 2018-10-07 DIAGNOSIS — C7931 Secondary malignant neoplasm of brain: Secondary | ICD-10-CM | POA: Diagnosis not present

## 2018-10-10 ENCOUNTER — Ambulatory Visit
Admission: RE | Admit: 2018-10-10 | Discharge: 2018-10-10 | Disposition: A | Discharge status: Home / Self Care | Payer: 59 | Source: Ambulatory Visit | Attending: Radiation Oncology | Admitting: Radiation Oncology

## 2018-10-10 ENCOUNTER — Telehealth: Payer: Self-pay | Admitting: Internal Medicine

## 2018-10-10 ENCOUNTER — Telehealth: Payer: Self-pay | Admitting: *Deleted

## 2018-10-10 DIAGNOSIS — C7931 Secondary malignant neoplasm of brain: Secondary | ICD-10-CM | POA: Diagnosis not present

## 2018-10-10 NOTE — Telephone Encounter (Signed)
Called patient per 12/30 sch message - left message with appt date and time for 12/31 appts.

## 2018-10-10 NOTE — Telephone Encounter (Signed)
Wife called regarding pt schedule, reviewed with MD. Message to scheduling for pt to be seen 12/ w/treatment.

## 2018-10-11 ENCOUNTER — Telehealth: Payer: Self-pay

## 2018-10-11 ENCOUNTER — Ambulatory Visit: Payer: 59 | Admitting: Adult Health

## 2018-10-11 ENCOUNTER — Encounter: Payer: Self-pay | Admitting: Internal Medicine

## 2018-10-11 ENCOUNTER — Ambulatory Visit: Payer: 59

## 2018-10-11 ENCOUNTER — Other Ambulatory Visit: Payer: 59

## 2018-10-11 ENCOUNTER — Inpatient Hospital Stay (HOSPITAL_BASED_OUTPATIENT_CLINIC_OR_DEPARTMENT_OTHER): Payer: 59 | Admitting: Internal Medicine

## 2018-10-11 ENCOUNTER — Inpatient Hospital Stay: Payer: 59

## 2018-10-11 ENCOUNTER — Ambulatory Visit: Payer: 59 | Admitting: Internal Medicine

## 2018-10-11 ENCOUNTER — Ambulatory Visit
Admission: RE | Admit: 2018-10-11 | Discharge: 2018-10-11 | Disposition: A | Discharge status: Home / Self Care | Payer: 59 | Source: Ambulatory Visit | Attending: Radiation Oncology | Admitting: Radiation Oncology

## 2018-10-11 VITALS — BP 157/97 | HR 105 | Temp 98.5°F | Resp 20 | Ht 73.0 in | Wt 246.3 lb

## 2018-10-11 DIAGNOSIS — C3492 Malignant neoplasm of unspecified part of left bronchus or lung: Secondary | ICD-10-CM

## 2018-10-11 DIAGNOSIS — C7A019 Malignant carcinoid tumor of the small intestine, unspecified portion: Secondary | ICD-10-CM | POA: Diagnosis not present

## 2018-10-11 DIAGNOSIS — R5383 Other fatigue: Secondary | ICD-10-CM | POA: Diagnosis not present

## 2018-10-11 DIAGNOSIS — C7B09 Secondary carcinoid tumors of other sites: Secondary | ICD-10-CM

## 2018-10-11 DIAGNOSIS — Z5111 Encounter for antineoplastic chemotherapy: Secondary | ICD-10-CM

## 2018-10-11 DIAGNOSIS — C7B02 Secondary carcinoid tumors of liver: Secondary | ICD-10-CM

## 2018-10-11 DIAGNOSIS — I10 Essential (primary) hypertension: Secondary | ICD-10-CM

## 2018-10-11 DIAGNOSIS — C7931 Secondary malignant neoplasm of brain: Secondary | ICD-10-CM | POA: Diagnosis not present

## 2018-10-11 DIAGNOSIS — C3490 Malignant neoplasm of unspecified part of unspecified bronchus or lung: Secondary | ICD-10-CM

## 2018-10-11 LAB — CMP (CANCER CENTER ONLY)
ALT: 29 U/L (ref 0–44)
AST: 17 U/L (ref 15–41)
Albumin: 3.5 g/dL (ref 3.5–5.0)
Alkaline Phosphatase: 78 U/L (ref 38–126)
Anion gap: 10 (ref 5–15)
BUN: 17 mg/dL (ref 6–20)
CO2: 21 mmol/L — ABNORMAL LOW (ref 22–32)
Calcium: 8.8 mg/dL — ABNORMAL LOW (ref 8.9–10.3)
Chloride: 106 mmol/L (ref 98–111)
Creatinine: 1.24 mg/dL (ref 0.61–1.24)
GFR, Est AFR Am: >60 mL/min (ref >60)
GFR, Estimated: >60 mL/min (ref >60)
Glucose, Bld: 218 mg/dL — ABNORMAL HIGH (ref 70–99)
Potassium: 3.9 mmol/L (ref 3.5–5.1)
Sodium: 137 mmol/L (ref 135–145)
Total Bilirubin: 0.5 mg/dL (ref 0.3–1.2)
Total Protein: 6.8 g/dL (ref 6.5–8.1)

## 2018-10-11 LAB — CBC WITH DIFFERENTIAL (CANCER CENTER ONLY)
Abs Immature Granulocytes: 0.04 10*3/uL (ref 0.00–0.07)
Basophils Absolute: 0 10*3/uL (ref 0.0–0.1)
Basophils Relative: 0 %
Eosinophils Absolute: 0 10*3/uL (ref 0.0–0.5)
Eosinophils Relative: 0 %
HCT: 43.4 % (ref 39.0–52.0)
Hemoglobin: 14.7 g/dL (ref 13.0–17.0)
Immature Granulocytes: 1 %
Lymphocytes Relative: 8 %
Lymphs Abs: 0.6 10*3/uL — ABNORMAL LOW (ref 0.7–4.0)
MCH: 37.4 pg — ABNORMAL HIGH (ref 26.0–34.0)
MCHC: 33.9 g/dL (ref 30.0–36.0)
MCV: 110.4 fL — ABNORMAL HIGH (ref 80.0–100.0)
Monocytes Absolute: 0.4 10*3/uL (ref 0.1–1.0)
Monocytes Relative: 6 %
Neutro Abs: 6.2 10*3/uL (ref 1.7–7.7)
Neutrophils Relative %: 85 %
Platelet Count: 206 10*3/uL (ref 150–400)
RBC: 3.93 MIL/uL — ABNORMAL LOW (ref 4.22–5.81)
RDW: 13.3 % (ref 11.5–15.5)
WBC Count: 7.2 10*3/uL (ref 4.0–10.5)
nRBC: 0 % (ref 0.0–0.2)

## 2018-10-11 NOTE — Progress Notes (Signed)
[name]   [phone]  OFFICE PROGRESS NOTE  [name], MD 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  PRIOR THERAPY:  1) Stereotactic radiotherapy to a solitary brain metastasis under the care of Dr. Lisbeth Renshaw on 01/25/2017. 2) Short course of concurrent chemoradiation with weekly carboplatin for AUC of 2 and paclitaxel 45 MG/M2 to the locally advanced disease in the chest. Status post 3 cycles. 3) Systemic chemotherapy with carboplatin for AUC of 5, Alimta 500 MG/M2 and Avastin 15 MG/KG every 3 weeks. First dose of 02/15/2017. Status post 6 cycles. Last dose 06/08/2017 with stable disease. 4) whole brain irradiation under the care of Dr. Lisbeth Renshaw expected to complete on October 14, 2018.   CURRENT THERAPY: Maintenance systemic chemotherapy with Alimta 500 MG/M2 in addition to Avastin 15 MG/KG every 3 weeks. First dose 07/13/2017.  Status post 20 cycles.  Starting from cycle #16 he will be treated with single agent Alimta only secondary to intolerance.  INTERVAL HISTORY: Calvin Wagner 52 y.o. male follow-up visit accompanied by his wife.  The patient is feeling fine today with no concerning complaints except for fatigue as well as redness of his face with the radiotherapy.  He denied having any current chest pain, shortness of breath, cough or hemoptysis.  He is currently on a taper dose of Decadron and he gained several pounds.  He was recently found to have multiple brain metastasis on repeat MRI of the brain and he is undergoing whole brain irradiation under the care of Dr. Lisbeth Renshaw.  He is expected to complete this course of  radiation on October 14, 2018.  The patient has been off treatment for the last few weeks.  He is here today for evaluation and discussion of his condition and treatment options.  He has no current nausea or vomiting, no fever or chills.   MEDICAL HISTORY: Past Medical History:  Diagnosis Date  . Encounter for antineoplastic chemotherapy 12/31/2016  . GERD 11/08/2009   Qualifier: Diagnosis of  By: Ronnald Ramp MD, Arvid Right Goals of care, counseling/discussion 12/31/2016  . History of radiation therapy 01/13/2017 - 02/02/2017   Site/dose:  Left lung: 30 Gy in 15 fractions  . HYPERTENSION 11/08/2009   Qualifier: Diagnosis of  By: Ronnald Ramp MD, Arvid Right.   . Large cell carcinoma of left lung, stage 4 ([facility]) 12/31/2016  . Migraine 02/25/2012  . Pneumonia   . PONV (postoperative nausea and vomiting)   . Rotator cuff tear, left     ALLERGIES:  has No Known Allergies.  MEDICATIONS:  Current Outpatient Medications  Medication Sig Dispense Refill  . albuterol (PROVENTIL HFA;VENTOLIN HFA) 108 (90 Base) MCG/ACT inhaler Inhale 2 puffs into the lungs every 4 (four) hours as needed.    Marland Kitchen amLODipine (NORVASC) 5 MG tablet Take 5 mg by mouth daily.    . budesonide (RA BUDESONIDE) 32 MCG/ACT nasal spray Place into both nostrils daily.    . BUDESONIDE PO Take by mouth. Budesonide slurry    . budesonide-formoterol (SYMBICORT) 80-4.5 MCG/ACT inhaler Inhale 2 puffs into the lungs 2 (two) times daily. 1 Inhaler 5  . chlorpheniramine-HYDROcodone (TUSSIONEX) 10-8 MG/5ML SUER Take 5 mLs by  mouth every 12 (twelve) hours as needed for cough. (Patient not taking: Reported on 09/28/2018) 473 mL 0  . dexamethasone (DECADRON) 4 MG tablet 4 mg by mouth twice a day the day before, day of and day after the chemotherapy 40 tablet 1  . folic acid (FOLVITE) 1 MG tablet Take 1 tablet (1 mg total) by mouth daily. 30 tablet 4  . furosemide (LASIX) 20 MG tablet TAKE 1 TABLET BY MOUTH EVERY DAY AS NEEDED (Patient not taking: Reported on  09/28/2018) 20 tablet 0  . LORazepam (ATIVAN) 1 MG tablet Take 1-1.5 tablets (1-1.5 mg total) by mouth every 4 (four) hours as needed for anxiety (30 minutes prior to radiotherapy or MRI scans). 60 tablet 0  . losartan (COZAAR) 50 MG tablet Take 50 mg by mouth daily.    . magic mouthwash SOLN Take 5 mLs by mouth 4 (four) times daily as needed for mouth pain. 1:1:1 mix-Nystatin. Benadryl and extra strength maalox. 240 mL 0  . magic mouthwash SOLN SWISH AND SPIT OR SWALLOW 5MLS 4 TIMES A DAY AS NEEDED  0  . methylPREDNISolone (MEDROL) 4 MG tablet Take 6 tabs on day 1, 5 tabs on day 2, 4 tabs on day 3, 3 tabs on day 4, 2 tabs on day 5, 1 tab on day 6, and then stop (Patient not taking: Reported on 09/28/2018) 21 tablet 0  . Multiple Vitamin (MULTIVITAMIN WITH MINERALS) TABS tablet Take 1 tablet by mouth daily.    . ondansetron (ZOFRAN) 8 MG tablet TAKE 1 TABLET BY MOUTH EVERY 8 HOURS AS NEEDED FOR NAUSEA OR VOMITING. 30 tablet 1  . ondansetron (ZOFRAN) 8 MG tablet TAKE 1 TABLET BY MOUTH EVERY 8 HOURS AS NEEDED FOR NAUSEA OR VOMITING. 20 tablet 0  . pantoprazole (PROTONIX) 40 MG tablet Take 40 mg by mouth daily.  2  . PROAIR HFA 108 (90 Base) MCG/ACT inhaler Inhale 2 puffs into the lungs every 4 (four) hours as needed.  5  . prochlorperazine (COMPAZINE) 10 MG tablet TAKE 1 TABLET BY MOUTH  EVERY 6 HOURS AS NEEDED FOR NAUSEA OR VOMITING 30 tablet 0  . traMADol (ULTRAM) 50 MG tablet TAKE 1 TABLET BY MOUTH EVERY 12 HOURS AS NEEDED (Patient not taking: Reported on 09/28/2018) 30 tablet 0  . XARELTO 20 MG TABS tablet TAKE 1 TABLET EVERY DAY WITH SUPPER 90 tablet 0   No current facility-administered medications for this visit.     SURGICAL HISTORY:  Past Surgical History:  Procedure Laterality Date  . INGUINAL HERNIA REPAIR    . SHOULDER ARTHROSCOPY WITH ROTATOR CUFF REPAIR Left 11/12/2016   Procedure: SHOULDER ARTHROSCOPY WITH ROTATOR CUFF REPAIR AND SUBACROMIAL DECOMPRESSION;  Surgeon: Tania Ade,  MD;  Location: [facility];  Service: Orthopedics;  Laterality: Left;  SHOULDER ARTHROSCOPY WITH ROTATOR CUFF REPAIR AND SUBACROMIAL DECOMPRESSION  . TONSILLECTOMY    . TOTAL KNEE ARTHROPLASTY    . VIDEO BRONCHOSCOPY Bilateral 12/10/2016   Procedure: VIDEO BRONCHOSCOPY WITHOUT FLUORO;  Surgeon: Tanda Rockers, MD;  Location: [facility];  Service: Cardiopulmonary;  Laterality: Bilateral;    REVIEW OF SYSTEMS:  A comprehensive review of systems was negative except for: Constitutional: positive for fatigue Respiratory: positive for cough Integument/breast: positive for rash   PHYSICAL EXAMINATION: General appearance: alert, cooperative, fatigued and no distress Head: Normocephalic, without obvious abnormality, atraumatic Neck: no adenopathy, no JVD, supple, symmetrical, trachea midline and thyroid not enlarged, symmetric, no tenderness/mass/nodules Lymph nodes: Cervical, supraclavicular, and axillary nodes normal. Resp: clear  to auscultation bilaterally Back: symmetric, no curvature. ROM normal. No CVA tenderness. Cardio: regular rate and rhythm, S1, S2 normal, no murmur, click, rub or gallop GI: soft, non-tender; bowel sounds normal; no masses,  no organomegaly Extremities: extremities normal, atraumatic, no cyanosis or edema Neurologic: Alert and oriented X 3, normal strength and tone. Normal symmetric reflexes. Normal coordination and gait  ECOG PERFORMANCE STATUS: 1 - Symptomatic but completely ambulatory  Blood pressure (!) 157/97, pulse (!) 105, temperature 98.5 F (36.9 C), temperature source Oral, resp. rate 20, height 6\' 1"  (1.854 m), weight 246 lb 4.8 oz (111.7 kg), SpO2 100 %.  LABORATORY DATA: Lab Results  Component Value Date   WBC 7.2 10/11/2018   HGB 14.7 10/11/2018   HCT 43.4 10/11/2018   MCV 110.4 (H) 10/11/2018   PLT 206 10/11/2018      Chemistry      Component Value Date/Time   NA 137 10/11/2018 1320   NA 138 10/08/2017 0907   K 3.9 10/11/2018 1320   K 4.1  10/08/2017 0907   CL 106 10/11/2018 1320   CO2 21 (L) 10/11/2018 1320   CO2 24 10/08/2017 0907   BUN 17 10/11/2018 1320   BUN 11.7 10/08/2017 0907   CREATININE 1.24 10/11/2018 1320   CREATININE 1.2 10/08/2017 0907      Component Value Date/Time   CALCIUM 8.8 (L) 10/11/2018 1320   CALCIUM 9.5 10/08/2017 0907   ALKPHOS 78 10/11/2018 1320   ALKPHOS 88 10/08/2017 0907   AST 17 10/11/2018 1320   AST 22 10/08/2017 0907   ALT 29 10/11/2018 1320   ALT 24 10/08/2017 0907   BILITOT 0.5 10/11/2018 1320   BILITOT 0.42 10/08/2017 3419       RADIOGRAPHIC STUDIES: Mr Jeri Cos FX Contrast  Result Date: 09/21/2018 CLINICAL DATA:  History of single brain metastasis.  Follow-up. EXAM: MRI HEAD WITHOUT AND WITH CONTRAST TECHNIQUE: Multiplanar, multiecho pulse sequences of the brain and surrounding structures were obtained without and with intravenous contrast. CONTRAST:  10 cc Gadavist intravenous COMPARISON:  Brain MRI 04/28/2018 FINDINGS: BRAIN New Lesions: 1. 5 mm enhancing lesion located along the superficial right temporal seen on 11:66. 2. Punctate enhancing lesion located in the left pons seen on 11:46 3. 4 mm enhancing lesion along the right posterior cerebral cortex on 11: 85 4. 3 mm enhancing lesion along the left parietal cortex seen on 11:132. 5. Punctate enhancing left frontal white matter lesion seen on 11:114 6. Punctate enhancing lesion along the lower left parietal cortex on 11:113 7. Punctate enhancing lesion along posterior right frontal cortex on 11:123 8. 6 mm enhancing lesion along the right frontal operculum on 11:86 9. Punctate enhancing lesion in the right posterior parietal subcortical white matter on 11:106, convincing on coronal postcontrast imaging 10. Punctate parasagittal left frontal metastasis on 11:115, convincing on coronal postcontrast imaging 11. Punctate enhancing lesion along the superficial left temporal lobe, convincing on reformats, seen on 11:51 Larger lesions: None.  Stable or Smaller lesions: None. A previously treated left inferior frontal metastasis is still nonenhancing. Other Brain findings: No acute infarct, hemorrhage, hydrocephalus, shift, or edema. Vascular: Major flow voids are preserved Skull and upper cervical spine: No focal marrow lesion. Sinuses/Orbits: Negative Other: These results will be called to the ordering clinician or representative by the Radiologist Assistant, These results will be called to the ordering clinician or representative by the Radiologist Assistant, and communication documented in the PACS or zVision Dashboard. IMPRESSION: 1. 11 new subcentimeter brain metastases  as described above. 2. Single previously treated left frontal metastasis remains nonenhancing. Electronically Signed   By: Monte Fantasia M.D.   On: 09/21/2018 15:28    ASSESSMENT AND PLAN: This is a very pleasant 52 years old white male with stage IV non-small cell lung cancer, high-grade neuroendocrine carcinoma presented with locally advanced disease in the chest as well as solitary brain metastasis and 2 liver lesions. The patient is status post stereotactic radiotherapy to the solitary brain metastasis as well as short course of concurrent chemoradiation to the locally advanced disease in the chest. He recently completed 6 cycles of systemic chemotherapy with carboplatin, Alimta and Avastin and tolerated this treatment fairly well. He was started on maintenance treatment with Alimta and Avastin status post 15 cycles.  Avastin was discontinued secondary to intolerance to the combination treatment.  He is currently on treatment with single agent Alimta status post 5 cycles. He has been tolerating his treatment with Alimta well except for skin rash after the last cycle of his treatment. He was recently found to have 11 new subcentimeter brain metastasis and the patient is currently undergoing whole brain irradiation. His current chemotherapy is on hold. I recommended for  the patient to have repeat CT scan of the chest, abdomen and pelvis for restaging of his disease next week before resuming his systemic chemotherapy next week. He was advised to call immediately if he has any concerning symptoms in the interval. The patient voices understanding of current disease status and treatment options and is in agreement with the current care plan. All questions were answered. The patient knows to call the clinic with any problems, questions or concerns. We can certainly see the patient much sooner if necessary.  Disclaimer: This note was dictated with voice recognition software. Similar sounding words can inadvertently be transcribed and may not be corrected upon review.

## 2018-10-11 NOTE — Telephone Encounter (Signed)
Called patient to see where they were and they were in the lobby. Natalie, NT could not find them and so I called for her.  Patient answered and was in the lobby. Gardiner Rhyme

## 2018-10-13 ENCOUNTER — Ambulatory Visit
Admission: RE | Admit: 2018-10-13 | Discharge: 2018-10-13 | Disposition: A | Discharge status: Home / Self Care | Payer: 59 | Source: Ambulatory Visit | Attending: Radiation Oncology | Admitting: Radiation Oncology

## 2018-10-13 DIAGNOSIS — C7931 Secondary malignant neoplasm of brain: Secondary | ICD-10-CM | POA: Insufficient documentation

## 2018-10-14 ENCOUNTER — Encounter: Payer: Self-pay | Admitting: Radiation Oncology

## 2018-10-14 ENCOUNTER — Ambulatory Visit
Admission: RE | Admit: 2018-10-14 | Discharge: 2018-10-14 | Disposition: A | Discharge status: Home / Self Care | Payer: 59 | Source: Ambulatory Visit | Attending: Radiation Oncology | Admitting: Radiation Oncology

## 2018-10-14 DIAGNOSIS — C3492 Malignant neoplasm of unspecified part of left bronchus or lung: Secondary | ICD-10-CM | POA: Diagnosis not present

## 2018-10-14 DIAGNOSIS — C7931 Secondary malignant neoplasm of brain: Secondary | ICD-10-CM | POA: Diagnosis not present

## 2018-10-18 ENCOUNTER — Other Ambulatory Visit: Payer: 59

## 2018-10-18 ENCOUNTER — Ambulatory Visit: Payer: 59

## 2018-10-18 ENCOUNTER — Ambulatory Visit: Payer: 59 | Admitting: Internal Medicine

## 2018-10-18 ENCOUNTER — Telehealth: Payer: Self-pay | Admitting: Internal Medicine

## 2018-10-18 NOTE — Telephone Encounter (Signed)
Scheduled appt per 12/31 los - pt is aware of appt date and time

## 2018-10-19 ENCOUNTER — Ambulatory Visit (HOSPITAL_COMMUNITY)
Admission: RE | Admit: 2018-10-19 | Discharge: 2018-10-19 | Disposition: A | Discharge status: Home / Self Care | Payer: 59 | Source: Ambulatory Visit | Attending: Internal Medicine | Admitting: Internal Medicine

## 2018-10-19 ENCOUNTER — Telehealth: Payer: Self-pay | Admitting: Radiation Oncology

## 2018-10-19 DIAGNOSIS — Z5111 Encounter for antineoplastic chemotherapy: Secondary | ICD-10-CM | POA: Diagnosis not present

## 2018-10-19 DIAGNOSIS — C3490 Malignant neoplasm of unspecified part of unspecified bronchus or lung: Secondary | ICD-10-CM | POA: Insufficient documentation

## 2018-10-19 MED ORDER — SODIUM CHLORIDE (PF) 0.9 % IJ SOLN
INTRAMUSCULAR | Status: AC
  Filled 2018-10-19: qty 50

## 2018-10-19 MED ORDER — IOHEXOL 300 MG/ML  SOLN
100.0000 mL | Freq: Once | INTRAMUSCULAR | Status: AC | PRN
  Administered 2018-10-19: 100 mL via INTRAVENOUS

## 2018-10-19 NOTE — Telephone Encounter (Signed)
LM for the patient's wife to see how he was doing. They had some questions per nursing that they wanted to discuss prior to his 1 month appt.

## 2018-10-20 ENCOUNTER — Inpatient Hospital Stay: Payer: 59

## 2018-10-20 ENCOUNTER — Inpatient Hospital Stay (HOSPITAL_BASED_OUTPATIENT_CLINIC_OR_DEPARTMENT_OTHER): Payer: 59 | Admitting: Internal Medicine

## 2018-10-20 ENCOUNTER — Inpatient Hospital Stay: Payer: 59 | Attending: Internal Medicine

## 2018-10-20 VITALS — BP 162/95 | HR 113 | Temp 98.1°F | Resp 18 | Ht 73.0 in | Wt 246.9 lb

## 2018-10-20 DIAGNOSIS — Z79899 Other long term (current) drug therapy: Secondary | ICD-10-CM

## 2018-10-20 DIAGNOSIS — C7931 Secondary malignant neoplasm of brain: Secondary | ICD-10-CM

## 2018-10-20 DIAGNOSIS — C7A1 Malignant poorly differentiated neuroendocrine tumors: Secondary | ICD-10-CM

## 2018-10-20 DIAGNOSIS — I700 Atherosclerosis of aorta: Secondary | ICD-10-CM | POA: Diagnosis not present

## 2018-10-20 DIAGNOSIS — C7B8 Other secondary neuroendocrine tumors: Secondary | ICD-10-CM | POA: Diagnosis not present

## 2018-10-20 DIAGNOSIS — Z923 Personal history of irradiation: Secondary | ICD-10-CM

## 2018-10-20 DIAGNOSIS — Z5112 Encounter for antineoplastic immunotherapy: Secondary | ICD-10-CM | POA: Diagnosis not present

## 2018-10-20 DIAGNOSIS — C3492 Malignant neoplasm of unspecified part of left bronchus or lung: Secondary | ICD-10-CM

## 2018-10-20 DIAGNOSIS — Z7901 Long term (current) use of anticoagulants: Secondary | ICD-10-CM | POA: Diagnosis not present

## 2018-10-20 DIAGNOSIS — I10 Essential (primary) hypertension: Secondary | ICD-10-CM

## 2018-10-20 DIAGNOSIS — Z7189 Other specified counseling: Secondary | ICD-10-CM

## 2018-10-20 LAB — CMP (CANCER CENTER ONLY)
ALT: 30 U/L (ref 0–44)
AST: 14 U/L — ABNORMAL LOW (ref 15–41)
Albumin: 3.5 g/dL (ref 3.5–5.0)
Alkaline Phosphatase: 77 U/L (ref 38–126)
Anion gap: 12 (ref 5–15)
BUN: 19 mg/dL (ref 6–20)
CO2: 23 mmol/L (ref 22–32)
Calcium: 9.1 mg/dL (ref 8.9–10.3)
Chloride: 103 mmol/L (ref 98–111)
Creatinine: 1.3 mg/dL — ABNORMAL HIGH (ref 0.61–1.24)
GFR, Est AFR Am: >60 mL/min (ref >60)
GFR, Estimated: >60 mL/min (ref >60)
Glucose, Bld: 158 mg/dL — ABNORMAL HIGH (ref 70–99)
Potassium: 3.8 mmol/L (ref 3.5–5.1)
Sodium: 138 mmol/L (ref 135–145)
Total Bilirubin: 0.7 mg/dL (ref 0.3–1.2)
Total Protein: 7.1 g/dL (ref 6.5–8.1)

## 2018-10-20 LAB — CBC WITH DIFFERENTIAL (CANCER CENTER ONLY)
Abs Immature Granulocytes: 0.05 10*3/uL (ref 0.00–0.07)
Basophils Absolute: 0 10*3/uL (ref 0.0–0.1)
Basophils Relative: 0 %
Eosinophils Absolute: 0 10*3/uL (ref 0.0–0.5)
Eosinophils Relative: 0 %
HCT: 45.8 % (ref 39.0–52.0)
Hemoglobin: 15.5 g/dL (ref 13.0–17.0)
Immature Granulocytes: 0 %
Lymphocytes Relative: 5 %
Lymphs Abs: 0.7 10*3/uL (ref 0.7–4.0)
MCH: 37.6 pg — ABNORMAL HIGH (ref 26.0–34.0)
MCHC: 33.8 g/dL (ref 30.0–36.0)
MCV: 111.2 fL — ABNORMAL HIGH (ref 80.0–100.0)
Monocytes Absolute: 0.8 10*3/uL (ref 0.1–1.0)
Monocytes Relative: 6 %
Neutro Abs: 11.4 10*3/uL — ABNORMAL HIGH (ref 1.7–7.7)
Neutrophils Relative %: 89 %
Platelet Count: 176 10*3/uL (ref 150–400)
RBC: 4.12 MIL/uL — ABNORMAL LOW (ref 4.22–5.81)
RDW: 13.2 % (ref 11.5–15.5)
WBC Count: 12.9 10*3/uL — ABNORMAL HIGH (ref 4.0–10.5)
nRBC: 0 % (ref 0.0–0.2)

## 2018-10-20 NOTE — Progress Notes (Signed)
DISCONTINUE ON PATHWAY REGIMEN - Non-Small Cell Lung     A cycle is every 21 days:     Pemetrexed   **Always confirm dose/schedule in your pharmacy ordering system**  REASON: Disease Progression PRIOR TREATMENT: [id number]: Pemetrexed 500 mg/m2 q21 Days Until Progression or Unacceptable Toxicity TREATMENT RESPONSE: Progressive Disease (PD)  START ON PATHWAY REGIMEN - Non-Small Cell Lung     A cycle is every 28 days:     Nivolumab   **Always confirm dose/schedule in your pharmacy ordering system**  Patient Characteristics: Stage IV Metastatic, Nonsquamous, Second Line - Chemotherapy/Immunotherapy, PS = 0, 1, No Prior PD-1/PD-L1  Inhibitor and Immunotherapy Candidate AJCC T Category: T1b Current Disease Status: Distant Metastases AJCC N Category: N2 AJCC M Category: M1c AJCC 8 Stage Grouping: IVB Histology: Nonsquamous Cell ROS1 Rearrangement Status: Negative T790M Mutation Status: Not Applicable - EGFR Mutation Negative/Unknown Other Mutations/Biomarkers: No Other Actionable Mutations NTRK Gene Fusion Status: Negative PD-L1 Expression Status: Did Not Order Test Chemotherapy/Immunotherapy LOT: Second Line Chemotherapy/Immunotherapy Molecular Targeted Therapy: Not Appropriate ALK Translocation Status: Negative EGFR Mutation Status: Negative/Wild Type BRAF [id number] Mutation Status: Negative Performance Status: PS = 0, 1 Immunotherapy Candidate Status: Candidate for Immunotherapy Prior Immunotherapy Status: No Prior PD-1/PD-L1 Inhibitor Intent of Therapy: Non-Curative / Palliative Intent, Discussed with Patient

## 2018-10-20 NOTE — Progress Notes (Signed)
[facility] Telephone:([phone]) [phone]   [phone]) [phone]  OFFICE PROGRESS NOTE  [name], MD 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  PRIOR THERAPY:  1) Stereotactic radiotherapy to a solitary brain metastasis under the care of Dr. Lisbeth Renshaw on 01/25/2017. 2) Short course of concurrent chemoradiation with weekly carboplatin for AUC of 2 and paclitaxel 45 MG/M2 to the locally advanced disease in the chest. Status post 3 cycles. 3) Systemic chemotherapy with carboplatin for AUC of 5, Alimta 500 MG/M2 and Avastin 15 MG/KG every 3 weeks. First dose of 02/15/2017. Status post 6 cycles. Last dose 06/08/2017 with stable disease. 4) whole brain irradiation under the care of Dr. Lisbeth Renshaw expected to complete on October 14, 2018. 5) Maintenance systemic chemotherapy with Alimta 500 MG/M2 in addition to Avastin 15 MG/KG every 3 weeks. First dose 07/13/2017.  Status post 20 cycles.  Starting from cycle #16 he will be treated with single agent Alimta only secondary to intolerance.   CURRENT THERAPY: Second line treatment with immunotherapy with nivolumab 480 mg IV every 4 weeks.  First dose October 26, 2018.  INTERVAL HISTORY: Calvin Wagner 53 y.o. male returns to the clinic today for follow-up visit accompanied by his wife.  The patient is feeling fine today with no concerning complaints except for fatigue and redness of his head after the radiation therapy.  He is feeling a little bit better.  He denied having any current chest pain, shortness breath, cough or hemoptysis.  He denied having any fever or chills.  He has no nausea, vomiting, diarrhea or constipation.  He  denied having any headache or visual changes.  He has no recent weight loss or night sweats.  He is currently on a taper dose of Decadron 2 mg p.o. twice daily.  The patient had repeat CT scan of the chest, abdomen and pelvis performed recently and he is here for evaluation and discussion of his scan results.   MEDICAL HISTORY: Past Medical History:  Diagnosis Date  . Encounter for antineoplastic chemotherapy 12/31/2016  . GERD 11/08/2009   Qualifier: Diagnosis of  By: Ronnald Ramp MD, Arvid Right Goals of care, counseling/discussion 12/31/2016  . History of radiation therapy 01/13/2017 - 02/02/2017   Site/dose:  Left lung: 30 Gy in 15 fractions  . HYPERTENSION 11/08/2009   Qualifier: Diagnosis of  By: Ronnald Ramp MD, Arvid Right.   . Large cell carcinoma of left lung, stage 4 ([facility]) 12/31/2016  . Migraine 02/25/2012  . Pneumonia   . PONV (postoperative nausea and vomiting)   . Rotator cuff tear, left     ALLERGIES:  has No Known Allergies.  MEDICATIONS:  Current Outpatient Medications  Medication Sig Dispense Refill  . albuterol (PROVENTIL HFA;VENTOLIN HFA) 108 (90 Base) MCG/ACT inhaler Inhale 2 puffs into the lungs every 4 (four) hours as needed.    Marland Kitchen amLODipine (NORVASC) 5 MG tablet Take 5 mg by mouth daily.    . budesonide (RA BUDESONIDE) 32 MCG/ACT nasal spray Place into both nostrils daily.    . BUDESONIDE PO Take by mouth. Budesonide slurry    . budesonide-formoterol (SYMBICORT) 80-4.5 MCG/ACT inhaler Inhale 2 puffs into the lungs 2 (two) times daily. 1  Inhaler 5  . chlorpheniramine-HYDROcodone (TUSSIONEX) 10-8 MG/5ML SUER Take 5 mLs by mouth every 12 (twelve) hours as needed for cough. 473 mL 0  . dexamethasone (DECADRON) 4 MG tablet 4 mg by mouth twice a day the day before, day of and day after the chemotherapy 40 tablet 1  . dexamethasone (DECADRON) 4 MG tablet Take 2 mg by mouth 2 (two) times daily. Pt takes 1/2 tablet bid per radiation and brain mets. .    . folic acid (FOLVITE) 1 MG tablet  Take 1 tablet (1 mg total) by mouth daily. 30 tablet 4  . furosemide (LASIX) 20 MG tablet TAKE 1 TABLET BY MOUTH EVERY DAY AS NEEDED (Patient not taking: Reported on 09/28/2018) 20 tablet 0  . LORazepam (ATIVAN) 1 MG tablet Take 1-1.5 tablets (1-1.5 mg total) by mouth every 4 (four) hours as needed for anxiety (30 minutes prior to radiotherapy or MRI scans). 60 tablet 0  . losartan (COZAAR) 50 MG tablet Take 50 mg by mouth daily.    . magic mouthwash SOLN Take 5 mLs by mouth 4 (four) times daily as needed for mouth pain. 1:1:1 mix-Nystatin. Benadryl and extra strength maalox. (Patient not taking: Reported on 10/11/2018) 240 mL 0  . methylPREDNISolone (MEDROL) 4 MG tablet Take 6 tabs on day 1, 5 tabs on day 2, 4 tabs on day 3, 3 tabs on day 4, 2 tabs on day 5, 1 tab on day 6, and then stop (Patient not taking: Reported on 09/28/2018) 21 tablet 0  . Multiple Vitamin (MULTIVITAMIN WITH MINERALS) TABS tablet Take 1 tablet by mouth daily.    . ondansetron (ZOFRAN) 8 MG tablet TAKE 1 TABLET BY MOUTH EVERY 8 HOURS AS NEEDED FOR NAUSEA OR VOMITING. 30 tablet 1  . pantoprazole (PROTONIX) 40 MG tablet Take 40 mg by mouth daily.  2  . PROAIR HFA 108 (90 Base) MCG/ACT inhaler Inhale 2 puffs into the lungs every 4 (four) hours as needed.  5  . prochlorperazine (COMPAZINE) 10 MG tablet TAKE 1 TABLET BY MOUTH  EVERY 6 HOURS AS NEEDED FOR NAUSEA OR VOMITING 30 tablet 0  . traMADol (ULTRAM) 50 MG tablet TAKE 1 TABLET BY MOUTH EVERY 12 HOURS AS NEEDED (Patient not taking: Reported on 09/28/2018) 30 tablet 0  . XARELTO 20 MG TABS tablet TAKE 1 TABLET EVERY DAY WITH SUPPER 90 tablet 0   No current facility-administered medications for this visit.     SURGICAL HISTORY:  Past Surgical History:  Procedure Laterality Date  . INGUINAL HERNIA REPAIR    . SHOULDER ARTHROSCOPY WITH ROTATOR CUFF REPAIR Left 11/12/2016   Procedure: SHOULDER ARTHROSCOPY WITH ROTATOR CUFF REPAIR AND SUBACROMIAL DECOMPRESSION;  Surgeon: Tania Ade, MD;  Location: [facility];  Service: Orthopedics;  Laterality: Left;  SHOULDER ARTHROSCOPY WITH ROTATOR CUFF REPAIR AND SUBACROMIAL DECOMPRESSION  . TONSILLECTOMY    . TOTAL KNEE ARTHROPLASTY    . VIDEO BRONCHOSCOPY Bilateral 12/10/2016   Procedure: VIDEO BRONCHOSCOPY WITHOUT FLUORO;  Surgeon: Tanda Rockers, MD;  Location: [facility];  Service: Cardiopulmonary;  Laterality: Bilateral;    REVIEW OF SYSTEMS:  Constitutional: positive for fatigue Eyes: negative Ears, nose, mouth, throat, and face: negative Respiratory: negative Cardiovascular: negative Gastrointestinal: negative Genitourinary:negative Integument/breast: negative Hematologic/lymphatic: negative Musculoskeletal:negative Neurological: negative Behavioral/Psych: negative Endocrine: negative Allergic/Immunologic: negative   PHYSICAL EXAMINATION: General appearance: alert, cooperative, fatigued and no distress Head: Normocephalic, without obvious abnormality, atraumatic Neck: no adenopathy, no JVD, supple, symmetrical, trachea midline and thyroid not enlarged, symmetric, no  tenderness/mass/nodules Lymph nodes: Cervical, supraclavicular, and axillary nodes normal. Resp: clear to auscultation bilaterally Back: symmetric, no curvature. ROM normal. No CVA tenderness. Cardio: regular rate and rhythm, S1, S2 normal, no murmur, click, rub or gallop GI: soft, non-tender; bowel sounds normal; no masses,  no organomegaly Extremities: extremities normal, atraumatic, no cyanosis or edema Neurologic: Alert and oriented X 3, normal strength and tone. Normal symmetric reflexes. Normal coordination and gait  ECOG PERFORMANCE STATUS: 1 - Symptomatic but completely ambulatory  Blood pressure (!) 162/95, pulse (!) 113, temperature 98.1 F (36.7 C), temperature source Oral, resp. rate 18, height 6\' 1"  (1.854 m), weight 246 lb 14.4 oz (112 kg), SpO2 100 %.  LABORATORY DATA: Lab Results  Component Value Date   WBC 12.9 (H) 10/20/2018    HGB 15.5 10/20/2018   HCT 45.8 10/20/2018   MCV 111.2 (H) 10/20/2018   PLT 176 10/20/2018      Chemistry      Component Value Date/Time   NA 138 10/20/2018 1310   NA 138 10/08/2017 0907   K 3.8 10/20/2018 1310   K 4.1 10/08/2017 0907   CL 103 10/20/2018 1310   CO2 23 10/20/2018 1310   CO2 24 10/08/2017 0907   BUN 19 10/20/2018 1310   BUN 11.7 10/08/2017 0907   CREATININE 1.30 (H) 10/20/2018 1310   CREATININE 1.2 10/08/2017 0907      Component Value Date/Time   CALCIUM 9.1 10/20/2018 1310   CALCIUM 9.5 10/08/2017 0907   ALKPHOS 77 10/20/2018 1310   ALKPHOS 88 10/08/2017 0907   AST 14 (L) 10/20/2018 1310   AST 22 10/08/2017 0907   ALT 30 10/20/2018 1310   ALT 24 10/08/2017 0907   BILITOT 0.7 10/20/2018 1310   BILITOT 0.42 10/08/2017 0907       RADIOGRAPHIC STUDIES: Ct Chest W Contrast  Result Date: 10/19/2018 CLINICAL DATA:  Metastatic lung cancer restaging, ongoing chemotherapy EXAM: CT CHEST, ABDOMEN, AND PELVIS WITH CONTRAST TECHNIQUE: Multidetector CT imaging of the chest, abdomen and pelvis was performed following the standard protocol during bolus administration of intravenous contrast. CONTRAST:  153mL OMNIPAQUE IOHEXOL 300 MG/ML  SOLN COMPARISON:  Multiple exams, including 07/22/2018 FINDINGS: CT CHEST FINDINGS Cardiovascular: Unremarkable Mediastinum/Nodes: Left hilar/infrahilar node 1.3 cm in short axis on image 27/2, formerly 0.7 cm. Lungs/Pleura: The left lower lobe nodule measures 1.7 by 1.2 cm on image 115/4, formerly 1.4 by 1.2 cm by my measurement. Perihilar ground-glass opacity on the left is likely therapy related. There is some mild focal narrowing of left lower lobe central bronchi on images 73-80 of series 4, similar to prior and probably therapy related. Musculoskeletal: Unremarkable CT ABDOMEN PELVIS FINDINGS Hepatobiliary: Heterogeneous but primarily hypodense lesion in segment 5 of the liver adjacent to the gallbladder measuring 1.9 by 1.5 cm on image  66/2. This was not readily appreciable on the prior exam, although a King back at the prior PET-CT from 12/17/2016 there was some faint hypermetabolic activity mentioned and observed in this location. Gallbladder unremarkable. No additional liver lesions identified. Pancreas: Unremarkable Spleen: Unremarkable Adrenals/Urinary Tract: Nonspecific 1.1 by 0.9 cm hypodense exophytic lesion from the right kidney lower pole. Adrenal glands normal. Otherwise unremarkable. Stomach/Bowel: Unremarkable Vascular/Lymphatic: Mild abdominal aortic atherosclerotic calcification. No appreciable pathologic adenopathy. Reproductive: Punctate calcifications in the prostate gland. Other: No supplemental non-categorized findings. Musculoskeletal: Ventral hernia mesh in the pelvis. Spondylosis and degenerative disc disease in the lower lumbar spine with suspected impingement at L4-5 and L5-S1, and possibly L3-4. IMPRESSION: 1. Enlarging  left lower lobe nodule, with enlarging left hilar adenopathy and a recurrent heterogeneous mass in segment 5 of the liver suspicious for metastatic disease. Overall appearance compatible with progression. 2. Otherwise stable ancillary findings as noted above. Electronically Signed   By: Van Clines M.D.   On: 10/19/2018 13:58   Mr Jeri Cos EX Contrast  Result Date: 09/21/2018 CLINICAL DATA:  History of single brain metastasis.  Follow-up. EXAM: MRI HEAD WITHOUT AND WITH CONTRAST TECHNIQUE: Multiplanar, multiecho pulse sequences of the brain and surrounding structures were obtained without and with intravenous contrast. CONTRAST:  10 cc Gadavist intravenous COMPARISON:  Brain MRI 04/28/2018 FINDINGS: BRAIN New Lesions: 1. 5 mm enhancing lesion located along the superficial right temporal seen on 11:66. 2. Punctate enhancing lesion located in the left pons seen on 11:46 3. 4 mm enhancing lesion along the right posterior cerebral cortex on 11: 85 4. 3 mm enhancing lesion along the left parietal  cortex seen on 11:132. 5. Punctate enhancing left frontal white matter lesion seen on 11:114 6. Punctate enhancing lesion along the lower left parietal cortex on 11:113 7. Punctate enhancing lesion along posterior right frontal cortex on 11:123 8. 6 mm enhancing lesion along the right frontal operculum on 11:86 9. Punctate enhancing lesion in the right posterior parietal subcortical white matter on 11:106, convincing on coronal postcontrast imaging 10. Punctate parasagittal left frontal metastasis on 11:115, convincing on coronal postcontrast imaging 11. Punctate enhancing lesion along the superficial left temporal lobe, convincing on reformats, seen on 11:51 Larger lesions: None. Stable or Smaller lesions: None. A previously treated left inferior frontal metastasis is still nonenhancing. Other Brain findings: No acute infarct, hemorrhage, hydrocephalus, shift, or edema. Vascular: Major flow voids are preserved Skull and upper cervical spine: No focal marrow lesion. Sinuses/Orbits: Negative Other: These results will be called to the ordering clinician or representative by the Radiologist Assistant, These results will be called to the ordering clinician or representative by the Radiologist Assistant, and communication documented in the PACS or zVision Dashboard. IMPRESSION: 1. 11 new subcentimeter brain metastases as described above. 2. Single previously treated left frontal metastasis remains nonenhancing. Electronically Signed   By: Monte Fantasia M.D.   On: 09/21/2018 15:28   Ct Abdomen Pelvis W Contrast  Result Date: 10/19/2018 CLINICAL DATA:  Metastatic lung cancer restaging, ongoing chemotherapy EXAM: CT CHEST, ABDOMEN, AND PELVIS WITH CONTRAST TECHNIQUE: Multidetector CT imaging of the chest, abdomen and pelvis was performed following the standard protocol during bolus administration of intravenous contrast. CONTRAST:  156mL OMNIPAQUE IOHEXOL 300 MG/ML  SOLN COMPARISON:  Multiple exams, including  07/22/2018 FINDINGS: CT CHEST FINDINGS Cardiovascular: Unremarkable Mediastinum/Nodes: Left hilar/infrahilar node 1.3 cm in short axis on image 27/2, formerly 0.7 cm. Lungs/Pleura: The left lower lobe nodule measures 1.7 by 1.2 cm on image 115/4, formerly 1.4 by 1.2 cm by my measurement. Perihilar ground-glass opacity on the left is likely therapy related. There is some mild focal narrowing of left lower lobe central bronchi on images 73-80 of series 4, similar to prior and probably therapy related. Musculoskeletal: Unremarkable CT ABDOMEN PELVIS FINDINGS Hepatobiliary: Heterogeneous but primarily hypodense lesion in segment 5 of the liver adjacent to the gallbladder measuring 1.9 by 1.5 cm on image 66/2. This was not readily appreciable on the prior exam, although a King back at the prior PET-CT from 12/17/2016 there was some faint hypermetabolic activity mentioned and observed in this location. Gallbladder unremarkable. No additional liver lesions identified. Pancreas: Unremarkable Spleen: Unremarkable Adrenals/Urinary Tract: Nonspecific 1.1 by  0.9 cm hypodense exophytic lesion from the right kidney lower pole. Adrenal glands normal. Otherwise unremarkable. Stomach/Bowel: Unremarkable Vascular/Lymphatic: Mild abdominal aortic atherosclerotic calcification. No appreciable pathologic adenopathy. Reproductive: Punctate calcifications in the prostate gland. Other: No supplemental non-categorized findings. Musculoskeletal: Ventral hernia mesh in the pelvis. Spondylosis and degenerative disc disease in the lower lumbar spine with suspected impingement at L4-5 and L5-S1, and possibly L3-4. IMPRESSION: 1. Enlarging left lower lobe nodule, with enlarging left hilar adenopathy and a recurrent heterogeneous mass in segment 5 of the liver suspicious for metastatic disease. Overall appearance compatible with progression. 2. Otherwise stable ancillary findings as noted above. Electronically Signed   By: Van Clines M.D.    On: 10/19/2018 13:58    ASSESSMENT AND PLAN: This is a very pleasant 53 years old white male with stage IV non-small cell lung cancer, high-grade neuroendocrine carcinoma presented with locally advanced disease in the chest as well as solitary brain metastasis and 2 liver lesions. The patient is status post stereotactic radiotherapy to the solitary brain metastasis as well as short course of concurrent chemoradiation to the locally advanced disease in the chest. He recently completed 6 cycles of systemic chemotherapy with carboplatin, Alimta and Avastin and tolerated this treatment fairly well. He was started on maintenance treatment with Alimta and Avastin status post 15 cycles.  Avastin was discontinued secondary to intolerance to the combination treatment.  He is currently on treatment with single agent Alimta status post 5 cycles. He has been tolerating his treatment well. Unfortunately he was recently found to have multiple metastatic brain lesion and the patient underwent whole brain irradiation. I recommended for the patient to have repeat staging work-up with CT scan of the chest, abdomen and pelvis which was performed yesterday. I personally and independently reviewed the scan images and discussed the results with the patient and his wife. His scan showed evidence for disease progression with enlargement of left lower lobe lung nodule in addition to hilar lymphadenopathy as well as progression of the liver lesion. I had a lengthy discussion with the patient and his wife about his current condition and treatment options. I recommended for him to discontinue his current treatment with Alimta at this point. I discussed with the patient his treatment options including palliative care versus palliative second line treatment with immunotherapy with nivolumab 480 mg IV every 4 weeks. The patient is interested in proceeding with the immunotherapy and he is expected to start the first cycle of this  treatment next week.  I discussed with the patient the adverse effect of this treatment including but not limited to immunotherapy mediated skin rash, diarrhea, inflammation of the lung, kidney, liver, thyroid or other endocrine dysfunction. I also recommended for the patient to start tapering his dose of Decadron and he will start with 2 mg p.o. daily for the next 7 days before starting his first cycle of the treatment. I will see him back for follow-up visit in 5 weeks for evaluation before starting cycle #2. He was advised to call immediately if he has any concerning symptoms in the interval. The patient voices understanding of current disease status and treatment options and is in agreement with the current care plan. All questions were answered. The patient knows to call the clinic with any problems, questions or concerns. We can certainly see the patient much sooner if necessary.  Disclaimer: This note was dictated with voice recognition software. Similar sounding words can inadvertently be transcribed and may not be corrected upon review.

## 2018-10-21 ENCOUNTER — Encounter: Payer: Self-pay | Admitting: Internal Medicine

## 2018-10-21 ENCOUNTER — Telehealth: Payer: Self-pay | Admitting: Internal Medicine

## 2018-10-21 DIAGNOSIS — Z5112 Encounter for antineoplastic immunotherapy: Secondary | ICD-10-CM | POA: Insufficient documentation

## 2018-10-21 NOTE — Telephone Encounter (Signed)
Called patient - unable to reach - left message - per 1/9 los scheduled .

## 2018-10-27 ENCOUNTER — Inpatient Hospital Stay: Payer: 59

## 2018-10-27 VITALS — BP 144/95 | HR 88 | Temp 98.6°F | Resp 14

## 2018-10-27 DIAGNOSIS — C3492 Malignant neoplasm of unspecified part of left bronchus or lung: Secondary | ICD-10-CM

## 2018-10-27 DIAGNOSIS — Z5112 Encounter for antineoplastic immunotherapy: Secondary | ICD-10-CM | POA: Diagnosis not present

## 2018-10-27 LAB — CMP (CANCER CENTER ONLY)
ALT: 24 U/L (ref 0–44)
AST: 15 U/L (ref 15–41)
Albumin: 3.5 g/dL (ref 3.5–5.0)
Alkaline Phosphatase: 86 U/L (ref 38–126)
Anion gap: 9 (ref 5–15)
BUN: 18 mg/dL (ref 6–20)
CO2: 26 mmol/L (ref 22–32)
Calcium: 9.1 mg/dL (ref 8.9–10.3)
Chloride: 101 mmol/L (ref 98–111)
Creatinine: 1.22 mg/dL (ref 0.61–1.24)
GFR, Est AFR Am: >60 mL/min (ref >60)
GFR, Estimated: >60 mL/min (ref >60)
Glucose, Bld: 81 mg/dL (ref 70–99)
Potassium: 3.9 mmol/L (ref 3.5–5.1)
Sodium: 136 mmol/L (ref 135–145)
Total Bilirubin: 0.5 mg/dL (ref 0.3–1.2)
Total Protein: 7.4 g/dL (ref 6.5–8.1)

## 2018-10-27 LAB — CBC WITH DIFFERENTIAL (CANCER CENTER ONLY)
Abs Immature Granulocytes: 0.01 10*3/uL (ref 0.00–0.07)
Basophils Absolute: 0 10*3/uL (ref 0.0–0.1)
Basophils Relative: 0 %
Eosinophils Absolute: 0.1 10*3/uL (ref 0.0–0.5)
Eosinophils Relative: 1 %
HCT: 47.1 % (ref 39.0–52.0)
Hemoglobin: 15.7 g/dL (ref 13.0–17.0)
Immature Granulocytes: 0 %
Lymphocytes Relative: 10 %
Lymphs Abs: 0.7 10*3/uL (ref 0.7–4.0)
MCH: 36.9 pg — ABNORMAL HIGH (ref 26.0–34.0)
MCHC: 33.3 g/dL (ref 30.0–36.0)
MCV: 110.8 fL — ABNORMAL HIGH (ref 80.0–100.0)
Monocytes Absolute: 0.7 10*3/uL (ref 0.1–1.0)
Monocytes Relative: 10 %
Neutro Abs: 5.5 10*3/uL (ref 1.7–7.7)
Neutrophils Relative %: 79 %
Platelet Count: 140 10*3/uL — ABNORMAL LOW (ref 150–400)
RBC: 4.25 MIL/uL (ref 4.22–5.81)
RDW: 13.1 % (ref 11.5–15.5)
WBC Count: 7.1 10*3/uL (ref 4.0–10.5)
nRBC: 0 % (ref 0.0–0.2)

## 2018-10-27 MED ORDER — SODIUM CHLORIDE 0.9 % IV SOLN
Freq: Once | INTRAVENOUS | Status: AC
  Administered 2018-10-27: 15:00:00 via INTRAVENOUS
  Filled 2018-10-27: qty 250

## 2018-10-27 MED ORDER — SODIUM CHLORIDE 0.9 % IV SOLN
480.0000 mg | Freq: Once | INTRAVENOUS | Status: AC
  Administered 2018-10-27: 480 mg via INTRAVENOUS
  Filled 2018-10-27: qty 48

## 2018-10-27 NOTE — Progress Notes (Signed)
  Radiation Oncology         [id number]) [phone] ________________________________  Name: Calvin Wagner MRN: [id number]  Date: 10/14/2018  DOB: Oct 08, 1966  End of Treatment Note  Diagnosis:   53 y.o. male with Stage IV(T1a, N2, M1b) non-small cell lung cancer consistent with poorly differentiated high-grade neuroendocrine carcinoma of the left lung with liver and brain metastases    Indication for treatment:  Palliative       Radiation treatment dates:   09/29/2018 - 10/14/2018  Site/dose:   Whole Brain / 30 Gy in 10 fractions  Beams/energy:   Isodose Plan / 6X Photon  Narrative: The patient tolerated radiation treatment relatively well.  He endorses continued nausea, stiff neck, dizziness, and headache. He was prescribed Decadron 2 mg BID and took a few doses, then stopped. He was encouraged to restart.  Plan: The patient has completed radiation treatment. The patient will return to radiation oncology clinic for routine followup in one month. I advised them to call or return sooner if they have any questions or concerns related to their recovery or treatment.  ------------------------------------------------  Jodelle Gross, MD, PhD  This document serves as a record of services personally performed by Kyung Rudd, MD. It was created on his behalf by Rae Lips, a trained medical scribe. The creation of this record is based on the scribe's personal observations and the provider's statements to them. This document has been checked and approved by the attending provider.

## 2018-10-27 NOTE — Patient Instructions (Signed)
[facility] Discharge Instructions for Patients Receiving Chemotherapy  Today you received the following chemotherapy agents Nivolumab.  To help prevent nausea and vomiting after your treatment, we encourage you to take your nausea medication.   If you develop nausea and vomiting that is not controlled by your nausea medication, call the clinic.   BELOW ARE SYMPTOMS THAT SHOULD BE REPORTED IMMEDIATELY:  *FEVER GREATER THAN 100.5 F  *CHILLS WITH OR WITHOUT FEVER  NAUSEA AND VOMITING THAT IS NOT CONTROLLED WITH YOUR NAUSEA MEDICATION  *UNUSUAL SHORTNESS OF BREATH  *UNUSUAL BRUISING OR BLEEDING  TENDERNESS IN MOUTH AND THROAT WITH OR WITHOUT PRESENCE OF ULCERS  *URINARY PROBLEMS  *BOWEL PROBLEMS  UNUSUAL RASH Items with * indicate a potential emergency and should be followed up as soon as possible.  Feel free to call the clinic should you have any questions or concerns. The clinic phone number is [phone].  Please show the [facility] at check-in to the Emergency Department and triage nurse.

## 2018-10-28 ENCOUNTER — Telehealth: Payer: Self-pay | Admitting: *Deleted

## 2018-10-28 LAB — TSH: TSH: 1.2 u[IU]/mL (ref 0.320–4.118)

## 2018-10-28 NOTE — Telephone Encounter (Signed)
TCT patient to follow up with him after his 1st nivolumab treatment on 10/27/18.  No answer but was able to leave vm message on identified phone #  Advised pt to call back if he had any questions or concerns.

## 2018-10-28 NOTE — Telephone Encounter (Signed)
-----   Message from Zola Button, RN sent at 10/27/2018  5:08 PM EST ----- Regarding: Dr. Julien Nordmann; First time F/U Call Pt received first time Nivoumab. Tolerated well. Thank you!

## 2018-11-01 ENCOUNTER — Ambulatory Visit: Payer: 59

## 2018-11-01 ENCOUNTER — Ambulatory Visit: Payer: 59 | Admitting: Internal Medicine

## 2018-11-01 ENCOUNTER — Other Ambulatory Visit: Payer: 59

## 2018-11-10 ENCOUNTER — Ambulatory Visit: Payer: 59 | Admitting: Internal Medicine

## 2018-11-10 ENCOUNTER — Ambulatory Visit: Payer: 59

## 2018-11-10 ENCOUNTER — Other Ambulatory Visit: Payer: 59

## 2018-11-15 ENCOUNTER — Other Ambulatory Visit: Payer: Self-pay | Admitting: Internal Medicine

## 2018-11-15 ENCOUNTER — Telehealth: Payer: Self-pay | Admitting: Medical Oncology

## 2018-11-15 DIAGNOSIS — Z5111 Encounter for antineoplastic chemotherapy: Secondary | ICD-10-CM

## 2018-11-15 DIAGNOSIS — C3492 Malignant neoplasm of unspecified part of left bronchus or lung: Secondary | ICD-10-CM

## 2018-11-15 DIAGNOSIS — C7931 Secondary malignant neoplasm of brain: Secondary | ICD-10-CM

## 2018-11-15 MED ORDER — HYDROCOD POLST-CPM POLST ER 10-8 MG/5ML PO SUER: 5.0000 mL | Freq: Two times a day (BID) | ORAL | 0 refills | Status: DC | PRN

## 2018-11-15 NOTE — Telephone Encounter (Signed)
I will refill Tussionex.  I did not prescribe Symbicort to him before.

## 2018-11-15 NOTE — Telephone Encounter (Signed)
Med refill -Tussionex and symbicort.

## 2018-11-16 ENCOUNTER — Encounter: Payer: Self-pay | Admitting: Medical Oncology

## 2018-11-16 ENCOUNTER — Other Ambulatory Visit: Payer: Self-pay | Admitting: Internal Medicine

## 2018-11-21 ENCOUNTER — Inpatient Hospital Stay (HOSPITAL_BASED_OUTPATIENT_CLINIC_OR_DEPARTMENT_OTHER): Payer: 59 | Admitting: Internal Medicine

## 2018-11-21 ENCOUNTER — Telehealth: Payer: Self-pay | Admitting: Internal Medicine

## 2018-11-21 ENCOUNTER — Encounter: Payer: Self-pay | Admitting: Internal Medicine

## 2018-11-21 ENCOUNTER — Other Ambulatory Visit: Payer: Self-pay

## 2018-11-21 ENCOUNTER — Inpatient Hospital Stay: Payer: 59

## 2018-11-21 ENCOUNTER — Encounter: Payer: Self-pay | Admitting: Radiation Oncology

## 2018-11-21 ENCOUNTER — Ambulatory Visit
Admission: RE | Admit: 2018-11-21 | Discharge: 2018-11-21 | Disposition: A | Discharge status: Home / Self Care | Payer: 59 | Source: Ambulatory Visit | Attending: Radiation Oncology | Admitting: Radiation Oncology

## 2018-11-21 VITALS — BP 139/94 | HR 115 | Temp 98.7°F | Resp 18 | Wt 247.0 lb

## 2018-11-21 VITALS — BP 132/96 | HR 102 | Temp 98.6°F | Resp 20 | Ht 73.0 in | Wt 247.1 lb

## 2018-11-21 DIAGNOSIS — C7931 Secondary malignant neoplasm of brain: Secondary | ICD-10-CM | POA: Insufficient documentation

## 2018-11-21 DIAGNOSIS — R05 Cough: Secondary | ICD-10-CM

## 2018-11-21 DIAGNOSIS — C3492 Malignant neoplasm of unspecified part of left bronchus or lung: Secondary | ICD-10-CM

## 2018-11-21 DIAGNOSIS — C3490 Malignant neoplasm of unspecified part of unspecified bronchus or lung: Secondary | ICD-10-CM

## 2018-11-21 DIAGNOSIS — C7B8 Other secondary neuroendocrine tumors: Secondary | ICD-10-CM | POA: Insufficient documentation

## 2018-11-21 DIAGNOSIS — Z7901 Long term (current) use of anticoagulants: Secondary | ICD-10-CM

## 2018-11-21 DIAGNOSIS — R112 Nausea with vomiting, unspecified: Secondary | ICD-10-CM

## 2018-11-21 DIAGNOSIS — Z5112 Encounter for antineoplastic immunotherapy: Secondary | ICD-10-CM

## 2018-11-21 DIAGNOSIS — C7A1 Malignant poorly differentiated neuroendocrine tumors: Secondary | ICD-10-CM

## 2018-11-21 DIAGNOSIS — Z79899 Other long term (current) drug therapy: Secondary | ICD-10-CM | POA: Insufficient documentation

## 2018-11-21 LAB — CMP (CANCER CENTER ONLY)
ALT: 17 U/L (ref 0–44)
AST: 17 U/L (ref 15–41)
Albumin: 3.7 g/dL (ref 3.5–5.0)
Alkaline Phosphatase: 85 U/L (ref 38–126)
Anion gap: 9 (ref 5–15)
BUN: 10 mg/dL (ref 6–20)
CO2: 27 mmol/L (ref 22–32)
Calcium: 9.5 mg/dL (ref 8.9–10.3)
Chloride: 102 mmol/L (ref 98–111)
Creatinine: 1.35 mg/dL — ABNORMAL HIGH (ref 0.61–1.24)
GFR, Est AFR Am: >60 mL/min (ref >60)
GFR, Estimated: >60 mL/min (ref >60)
Glucose, Bld: 116 mg/dL — ABNORMAL HIGH (ref 70–99)
Potassium: 4.3 mmol/L (ref 3.5–5.1)
Sodium: 138 mmol/L (ref 135–145)
Total Bilirubin: 0.5 mg/dL (ref 0.3–1.2)
Total Protein: 7.2 g/dL (ref 6.5–8.1)

## 2018-11-21 LAB — CBC WITH DIFFERENTIAL (CANCER CENTER ONLY)
Abs Immature Granulocytes: 0.02 10*3/uL (ref 0.00–0.07)
Basophils Absolute: 0 10*3/uL (ref 0.0–0.1)
Basophils Relative: 0 %
Eosinophils Absolute: 0.1 10*3/uL (ref 0.0–0.5)
Eosinophils Relative: 2 %
HCT: 43.6 % (ref 39.0–52.0)
Hemoglobin: 14.8 g/dL (ref 13.0–17.0)
Immature Granulocytes: 0 %
Lymphocytes Relative: 14 %
Lymphs Abs: 0.7 10*3/uL (ref 0.7–4.0)
MCH: 36.2 pg — ABNORMAL HIGH (ref 26.0–34.0)
MCHC: 33.9 g/dL (ref 30.0–36.0)
MCV: 106.6 fL — ABNORMAL HIGH (ref 80.0–100.0)
Monocytes Absolute: 0.7 10*3/uL (ref 0.1–1.0)
Monocytes Relative: 14 %
Neutro Abs: 3.5 10*3/uL (ref 1.7–7.7)
Neutrophils Relative %: 70 %
Platelet Count: 188 10*3/uL (ref 150–400)
RBC: 4.09 MIL/uL — ABNORMAL LOW (ref 4.22–5.81)
RDW: 12.7 % (ref 11.5–15.5)
WBC Count: 5 10*3/uL (ref 4.0–10.5)
nRBC: 0 % (ref 0.0–0.2)

## 2018-11-21 LAB — TSH: TSH: 0.683 u[IU]/mL (ref 0.320–4.118)

## 2018-11-21 MED ORDER — LORAZEPAM 1 MG PO TABS: 1.0000 mg | ORAL_TABLET | ORAL | 0 refills | Status: DC | PRN

## 2018-11-21 MED ORDER — PROCHLORPERAZINE MALEATE 10 MG PO TABS: ORAL_TABLET | ORAL | 0 refills | Status: DC

## 2018-11-21 MED ORDER — ONDANSETRON HCL 8 MG PO TABS: ORAL_TABLET | ORAL | 0 refills | Status: AC

## 2018-11-21 NOTE — Progress Notes (Signed)
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  Interval Since Last Radiation:   09/29/2018 - 10/14/2018:  Whole Brain / 30 Gy in 10 fractions  01/13/17 - 02/02/17: Left lung: 30 Gy in 15 fractions  01/25/17 SRS Treatment: PTV1 Left Frontal 68mm: 20 Gy in 1 fraction   Narrative:  Mr. Calvin Wagner is a pleasant, 53 y.o. gentleman who was diagnosed with stage IV lung cancer in the spring of 2018. He received SRS to a left frontal lesion and he has responded well to this with complete resolution noted on imaging since 09/2017. He has also received radiotherapy to the lung, and has been continued on systemic therapy as well and is currently receiving Alimta. MRI of the brain on 04/28/18 which did not reveal new or persistent disease, and he had a recent scan on 09/21/18 that revealed 11 new metastatic lesions, the largest measured 6 mm. He was offered whole brain radiotherapy which he completed on 10/14/2018. He comes today for a 1 month follow up. He is currently taking nivolumab infusions every 4 weeks which he began on 10/26/2018.  On review of systems, he continues to have nausea and reports that he's been off dexamethasone now for several weeks. He describes morning time nausea, no emesis, and minimal relief from taking Zofran and Compazine prn. He denies any abdominal pain. His skin is healing well per report but he did have some modest reddening of the skin following treatment. He denies any chest pain or shortness of breath. No other complaints are noted.  Past Medical History:  Past Medical History:  Diagnosis Date  . Encounter for antineoplastic  chemotherapy 12/31/2016  . GERD 11/08/2009   Qualifier: Diagnosis of  By: Ronnald Ramp MD, Arvid Right Goals of care, counseling/discussion 12/31/2016  . History of radiation therapy 01/13/2017 - 02/02/2017   Site/dose:  Left lung: 30 Gy in 15 fractions  . HYPERTENSION 11/08/2009   Qualifier: Diagnosis of  By: Ronnald Ramp MD, Arvid Right.   . 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 Migraine 02/25/2012  . Pneumonia   . PONV (postoperative nausea and vomiting)   . Rotator cuff tear, left     Past Surgical History: Past Surgical History:  Procedure Laterality Date  . INGUINAL HERNIA REPAIR    . SHOULDER ARTHROSCOPY WITH ROTATOR CUFF REPAIR Left 11/12/2016   Procedure: SHOULDER ARTHROSCOPY WITH ROTATOR CUFF REPAIR AND SUBACROMIAL DECOMPRESSION;  Surgeon: Tania Ade, MD;  Location: [facility];  Service: Orthopedics;  Laterality: Left;  SHOULDER ARTHROSCOPY WITH ROTATOR CUFF REPAIR AND SUBACROMIAL DECOMPRESSION  . TONSILLECTOMY    . TOTAL KNEE ARTHROPLASTY    . VIDEO BRONCHOSCOPY Bilateral 12/10/2016   Procedure: VIDEO BRONCHOSCOPY WITHOUT FLUORO;  Surgeon: Tanda Rockers, MD;  Location: [facility];  Service: Cardiopulmonary;  Laterality: Bilateral;    Social History:  Social History   Socioeconomic History  . Marital status: Married    Spouse name: Calvin Wagner  . Number of children: 2  . Years of education: Not on file  . Highest education level: Not on file  Occupational History  . Occupation: Associate Professor  Employer: [employer]  Social Needs  . Financial resource strain: Not on file  . Food insecurity:    Worry: Not on file    Inability: Not on file  . Transportation needs:    Medical: No    Non-medical: No  Tobacco Use  . Smoking status: Never Smoker  . Smokeless tobacco: Never Used  Substance and Sexual Activity  . Alcohol use: Yes    Comment: social  . Drug use: No  . Sexual activity: Not Currently  Lifestyle  . Physical activity:    Days per week: Not  on file    Minutes per session: Not on file  . Stress: Not on file  Relationships  . Social connections:    Talks on phone: Not on file    Gets together: Not on file    Attends religious service: Not on file    Active member of club or organization: Not on file    Attends meetings of clubs or organizations: Not on file    Relationship status: Not on file  . Intimate partner violence:    Fear of current or ex partner: Not on file    Emotionally abused: Not on file    Physically abused: Not on file    Forced sexual activity: Not on file  Other Topics Concern  . Not on file  Social History Narrative   Lives at home wife and 2 kids   Caffeine use: none    The patient is on disability and is no longer working at [employer]. He lives in [place] and is accompanied by his wife.  Family History: Family History  Problem Relation Age of Onset  . Heart disease Mother 69  . Lung cancer Other        family history  . CAD Other        strong family history male and male <50  . Mental retardation Other   . Heart attack Brother 20  . Alcohol abuse Neg Hx   . Diabetes Neg Hx   . Early death Neg Hx   . Hyperlipidemia Neg Hx   . Hypertension Neg Hx   . Kidney disease Neg Hx   . Stroke Neg Hx   . Migraines Neg Hx                   ALLERGIES:  has No Known Allergies.  Meds: Current Outpatient Medications  Medication Sig Dispense Refill  . albuterol (PROVENTIL HFA;VENTOLIN HFA) 108 (90 Base) MCG/ACT inhaler Inhale 2 puffs into the lungs every 4 (four) hours as needed.    Marland Kitchen amLODipine (NORVASC) 5 MG tablet Take 5 mg by mouth daily.    . budesonide (RA BUDESONIDE) 32 MCG/ACT nasal spray Place into both nostrils daily.    . BUDESONIDE PO Take by mouth. Budesonide slurry    . budesonide-formoterol (SYMBICORT) 80-4.5 MCG/ACT inhaler Inhale 2 puffs into the lungs 2 (two) times daily. 1 Inhaler 5  . chlorpheniramine-HYDROcodone (TUSSIONEX) 10-8 MG/5ML SUER Take 5 mLs by mouth every 12  (twelve) hours as needed for cough. 473 mL 0  . dexamethasone (DECADRON) 4 MG tablet 4 mg by mouth twice a day the day before, day of and day after the chemotherapy 40 tablet 1  . dexamethasone (DECADRON) 4 MG tablet Take 2 mg by mouth 2 (two) times daily. Pt takes 1/2 tablet bid per radiation and brain mets. .    . folic acid (FOLVITE) 1 MG tablet Take 1  tablet (1 mg total) by mouth daily. 30 tablet 4  . furosemide (LASIX) 20 MG tablet TAKE 1 TABLET BY MOUTH EVERY DAY AS NEEDED 20 tablet 0  . LORazepam (ATIVAN) 1 MG tablet Take 1-1.5 tablets (1-1.5 mg total) by mouth every 4 (four) hours as needed for anxiety (30 minutes prior to radiotherapy or MRI scans). 60 tablet 0  . losartan (COZAAR) 50 MG tablet Take 50 mg by mouth daily.    . magic mouthwash SOLN Take 5 mLs by mouth 4 (four) times daily as needed for mouth pain. 1:1:1 mix-Nystatin. Benadryl and extra strength maalox. 240 mL 0  . methylPREDNISolone (MEDROL) 4 MG tablet Take 6 tabs on day 1, 5 tabs on day 2, 4 tabs on day 3, 3 tabs on day 4, 2 tabs on day 5, 1 tab on day 6, and then stop 21 tablet 0  . Multiple Vitamin (MULTIVITAMIN WITH MINERALS) TABS tablet Take 1 tablet by mouth daily.    . ondansetron (ZOFRAN) 8 MG tablet TAKE 1 TABLET BY MOUTH EVERY 8 HOURS AS NEEDED FOR NAUSEA OR VOMITING. 30 tablet 1  . pantoprazole (PROTONIX) 40 MG tablet Take 40 mg by mouth daily.  2  . PROAIR HFA 108 (90 Base) MCG/ACT inhaler Inhale 2 puffs into the lungs every 4 (four) hours as needed.  5  . prochlorperazine (COMPAZINE) 10 MG tablet TAKE 1 TABLET BY MOUTH  EVERY 6 HOURS AS NEEDED FOR NAUSEA OR VOMITING 30 tablet 0  . traMADol (ULTRAM) 50 MG tablet TAKE 1 TABLET BY MOUTH EVERY 12 HOURS AS NEEDED 30 tablet 0  . XARELTO 20 MG TABS tablet TAKE 1 TABLET EVERY DAY WITH SUPPER 90 tablet 0   No current facility-administered medications for this encounter.     Physical Findings:  weight is 247 lb (112 kg). His oral temperature is 98.7 F (37.1 C). His  blood pressure is 139/94 (abnormal) and his pulse is 115 (abnormal). His respiration is 18 and oxygen saturation is 100%.  Pain Assessment Pain Score: 0-No pain/10 In general this is a well appearing caucasian male in no acute distress. He's alert and oriented x4 and appropriate throughout the examination. Cardiopulmonary assessment is negative for acute distress and he exhibits normal effort. He appears to be neurologically intact without focal deficits. His scalp is dry without desquamation and mild hyperpigmentation is noted of the base of the ears along the scalp.     Lab Findings: Lab Results  Component Value Date   WBC 7.1 10/27/2018   HGB 15.7 10/27/2018   HCT 47.1 10/27/2018   MCV 110.8 (H) 10/27/2018   PLT 140 (L) 10/27/2018     Radiographic Findings: No results found.  Impression/Plan: 1. 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  The patient is recovering from radiotherapy and will proceed with immunotherapy with Dr. Julien Nordmann. We will plan for his first post treatment MRI of the brain in early April 2020. I will contact him with results at his request by phone. He is in agreement with this plan. 2. Nausea. It is unclear if his symptoms are anticipatory in addition to being related to his brain disease. He is going to try scheduling zofran and using compazine prn, and his ativan prn. New rx's were sent in for each of these as he is running low.  3. Claustrophobia. The patient will also use ativan prn MRI scans. We  will follow this expectantly.      Carola Rhine, PAC

## 2018-11-21 NOTE — Telephone Encounter (Signed)
Scheduled appt per 02/10 los.  Gave patient contrast and instructions fro CT scan.

## 2018-11-21 NOTE — Progress Notes (Signed)
[facility] Telephone:([phone]) [phone]   Fax:([phone]) [phone]  OFFICE PROGRESS NOTE  [name], MD [address] [place] [address]  DIAGNOSIS: stage IV (T1a, N2, M1b) non-small cell lung cancer consistent with poorly differentiated high-grade neuroendocrine carcinoma presented with small left lower lobe pulmonary nodule in addition to left hilar and subcarinal lymphadenopathy as well as liver and brain metastasis diagnosed in March 2018.  PRIOR THERAPY:  1) Stereotactic radiotherapy to a solitary brain metastasis under the care of Dr. Lisbeth Renshaw on 01/25/2017. 2) Short course of concurrent chemoradiation with weekly carboplatin for AUC of 2 and paclitaxel 45 MG/M2 to the locally advanced disease in the chest. Status post 3 cycles. 3) Systemic chemotherapy with carboplatin for AUC of 5, Alimta 500 MG/M2 and Avastin 15 MG/KG every 3 weeks. First dose of 02/15/2017. Status post 6 cycles. Last dose 06/08/2017 with stable disease. 4) whole brain irradiation under the care of Dr. Lisbeth Renshaw expected to complete on October 14, 2018. 5) Maintenance systemic chemotherapy with Alimta 500 MG/M2 in addition to Avastin 15 MG/KG every 3 weeks. First dose 07/13/2017.  Status post 20 cycles.  Starting from cycle #16 he will be treated with single agent Alimta only secondary to intolerance.   CURRENT THERAPY: Second line treatment with immunotherapy with nivolumab 480 mg IV every 4 weeks.  First dose October 26, 2018.  Status post 2 cycles.  INTERVAL HISTORY: Calvin Wagner 53 y.o. male returns to the clinic today for follow-up visit accompanied by his wife.  The patient is feeling fine today with no concerning complaints except for arthralgia in his knees as well as few episodes of nausea after the treatment.  He denied having any current chest pain, shortness of breath, cough or hemoptysis.  He has no fever or chills.  He denied having any current nausea, vomiting, diarrhea or  constipation.  He has no skin rash or itching.  He is here today for evaluation before starting cycle #3 of his treatment.   MEDICAL HISTORY: Past Medical History:  Diagnosis Date  . Encounter for antineoplastic chemotherapy 12/31/2016  . GERD 11/08/2009   Qualifier: Diagnosis of  By: Ronnald Ramp MD, Arvid Right Goals of care, counseling/discussion 12/31/2016  . History of radiation therapy 01/13/2017 - 02/02/2017   Site/dose:  Left lung: 30 Gy in 15 fractions  . HYPERTENSION 11/08/2009   Qualifier: Diagnosis of  By: Ronnald Ramp MD, Arvid Right.   . Large cell carcinoma of left lung, stage 4 ([facility]) 12/31/2016  . Migraine 02/25/2012  . Pneumonia   . PONV (postoperative nausea and vomiting)   . Rotator cuff tear, left     ALLERGIES:  has No Known Allergies.  MEDICATIONS:  Current Outpatient Medications  Medication Sig Dispense Refill  . albuterol (PROVENTIL HFA;VENTOLIN HFA) 108 (90 Base) MCG/ACT inhaler Inhale 2 puffs into the lungs every 4 (four) hours as needed.    Marland Kitchen amLODipine (NORVASC) 5 MG tablet Take 5 mg by mouth daily.    . budesonide (RA BUDESONIDE) 32 MCG/ACT nasal spray Place into both nostrils daily.    . BUDESONIDE PO Take by mouth. Budesonide slurry    . budesonide-formoterol (SYMBICORT) 80-4.5 MCG/ACT inhaler Inhale 2 puffs into the lungs 2 (two) times daily. 1 Inhaler 5  . chlorpheniramine-HYDROcodone (TUSSIONEX) 10-8 MG/5ML SUER Take 5 mLs by mouth every 12 (twelve) hours as needed for cough. 678 mL 0  . folic acid (FOLVITE) 1 MG tablet Take 1 tablet (  1 mg total) by mouth daily. 30 tablet 4  . furosemide (LASIX) 20 MG tablet TAKE 1 TABLET BY MOUTH EVERY DAY AS NEEDED 20 tablet 0  . LORazepam (ATIVAN) 1 MG tablet Take 1-1.5 tablets (1-1.5 mg total) by mouth every 4 (four) hours as needed for anxiety (30 minutes prior to radiotherapy or MRI scans, and nausea). 120 tablet 0  . losartan (COZAAR) 50 MG tablet Take 50 mg by mouth daily.    . magic mouthwash SOLN Take 5 mLs by mouth 4 (four)  times daily as needed for mouth pain. 1:1:1 mix-Nystatin. Benadryl and extra strength maalox. 240 mL 0  . methylPREDNISolone (MEDROL) 4 MG tablet Take 6 tabs on day 1, 5 tabs on day 2, 4 tabs on day 3, 3 tabs on day 4, 2 tabs on day 5, 1 tab on day 6, and then stop 21 tablet 0  . Multiple Vitamin (MULTIVITAMIN WITH MINERALS) TABS tablet Take 1 tablet by mouth daily.    . ondansetron (ZOFRAN) 8 MG tablet TAKE 1 TABLET BY MOUTH EVERY 8 HOURS AS NEEDED FOR NAUSEA OR VOMITING. 90 tablet 0  . pantoprazole (PROTONIX) 40 MG tablet Take 40 mg by mouth daily.  2  . PROAIR HFA 108 (90 Base) MCG/ACT inhaler Inhale 2 puffs into the lungs every 4 (four) hours as needed.  5  . prochlorperazine (COMPAZINE) 10 MG tablet TAKE 1 TABLET BY MOUTH  EVERY 6 HOURS AS NEEDED FOR NAUSEA OR VOMITING 90 tablet 0  . traMADol (ULTRAM) 50 MG tablet TAKE 1 TABLET BY MOUTH EVERY 12 HOURS AS NEEDED 30 tablet 0  . XARELTO 20 MG TABS tablet TAKE 1 TABLET EVERY DAY WITH SUPPER 90 tablet 0   No current facility-administered medications for this visit.     SURGICAL HISTORY:  Past Surgical History:  Procedure Laterality Date  . INGUINAL HERNIA REPAIR    . SHOULDER ARTHROSCOPY WITH ROTATOR CUFF REPAIR Left 11/12/2016   Procedure: SHOULDER ARTHROSCOPY WITH ROTATOR CUFF REPAIR AND SUBACROMIAL DECOMPRESSION;  Surgeon: Tania Ade, MD;  Location: [facility];  Service: Orthopedics;  Laterality: Left;  SHOULDER ARTHROSCOPY WITH ROTATOR CUFF REPAIR AND SUBACROMIAL DECOMPRESSION  . TONSILLECTOMY    . TOTAL KNEE ARTHROPLASTY    . VIDEO BRONCHOSCOPY Bilateral 12/10/2016   Procedure: VIDEO BRONCHOSCOPY WITHOUT FLUORO;  Surgeon: Tanda Rockers, MD;  Location: [facility];  Service: Cardiopulmonary;  Laterality: Bilateral;    REVIEW OF SYSTEMS:  A comprehensive review of systems was negative except for: Constitutional: positive for fatigue Respiratory: positive for dyspnea on exertion Musculoskeletal: positive for arthralgias   PHYSICAL  EXAMINATION: General appearance: alert, cooperative, fatigued and no distress Head: Normocephalic, without obvious abnormality, atraumatic Neck: no adenopathy, no JVD, supple, symmetrical, trachea midline and thyroid not enlarged, symmetric, no tenderness/mass/nodules Lymph nodes: Cervical, supraclavicular, and axillary nodes normal. Resp: clear to auscultation bilaterally Back: symmetric, no curvature. ROM normal. No CVA tenderness. Cardio: regular rate and rhythm, S1, S2 normal, no murmur, click, rub or gallop GI: soft, non-tender; bowel sounds normal; no masses,  no organomegaly Extremities: extremities normal, atraumatic, no cyanosis or edema  ECOG PERFORMANCE STATUS: 1 - Symptomatic but completely ambulatory  Blood pressure (!) 132/96, pulse (!) 102, temperature 98.6 F (37 C), temperature source Oral, resp. rate 20, height 6\' 1"  (1.854 m), weight 247 lb 1.6 oz (112.1 kg), SpO2 100 %.  LABORATORY DATA: Lab Results  Component Value Date   WBC 5.0 11/21/2018   HGB 14.8 11/21/2018   HCT 43.6 11/21/2018  MCV 106.6 (H) 11/21/2018   PLT 188 11/21/2018      Chemistry      Component Value Date/Time   NA 136 10/27/2018 1440   NA 138 10/08/2017 0907   K 3.9 10/27/2018 1440   K 4.1 10/08/2017 0907   CL 101 10/27/2018 1440   CO2 26 10/27/2018 1440   CO2 24 10/08/2017 0907   BUN 18 10/27/2018 1440   BUN 11.7 10/08/2017 0907   CREATININE 1.22 10/27/2018 1440   CREATININE 1.2 10/08/2017 0907      Component Value Date/Time   CALCIUM 9.1 10/27/2018 1440   CALCIUM 9.5 10/08/2017 0907   ALKPHOS 86 10/27/2018 1440   ALKPHOS 88 10/08/2017 0907   AST 15 10/27/2018 1440   AST 22 10/08/2017 0907   ALT 24 10/27/2018 1440   ALT 24 10/08/2017 0907   BILITOT 0.5 10/27/2018 1440   BILITOT 0.42 10/08/2017 0907       RADIOGRAPHIC STUDIES: No results found.  ASSESSMENT AND PLAN: This is a very pleasant 53 years old white male with stage IV non-small cell lung cancer, high-grade  neuroendocrine carcinoma presented with locally advanced disease in the chest as well as solitary brain metastasis and 2 liver lesions. The patient is status post stereotactic radiotherapy to the solitary brain metastasis as well as short course of concurrent chemoradiation to the locally advanced disease in the chest. He recently completed 6 cycles of systemic chemotherapy with carboplatin, Alimta and Avastin and tolerated this treatment fairly well. He was started on maintenance treatment with Alimta and Avastin status post 15 cycles.  Avastin was discontinued secondary to intolerance to the combination treatment.  He is currently on treatment with single agent Alimta status post 5 cycles. He has been tolerating his treatment well. Unfortunately he was recently found to have multiple metastatic brain lesion and the patient underwent whole brain irradiation. His scan showed evidence for disease progression with enlargement of left lower lobe lung nodule in addition to hilar lymphadenopathy as well as progression of the liver lesion. He was started on treatment with nivolumab 480 mg IV every 4 weeks status post 2 cycles.  He has been tolerating this treatment well with no concerning adverse effects. I recommended for him to proceed with cycle #3 today as scheduled. I will see the patient back for follow-up visit in 4 weeks for evaluation with repeat CT scan of the chest, abdomen and pelvis for restaging of his disease. The patient was given refill for Tussionex for the cough today. He was advised to call immediately if he has any concerning symptoms in the interval. The patient voices understanding of current disease status and treatment options and is in agreement with the current care plan. All questions were answered. The patient knows to call the clinic with any problems, questions or concerns. We can certainly see the patient much sooner if necessary.  Disclaimer: This note was dictated with voice  recognition software. Similar sounding words can inadvertently be transcribed and may not be corrected upon review.

## 2018-11-22 ENCOUNTER — Ambulatory Visit: Payer: 59 | Admitting: Internal Medicine

## 2018-11-22 ENCOUNTER — Other Ambulatory Visit: Payer: 59

## 2018-11-22 ENCOUNTER — Other Ambulatory Visit: Payer: Self-pay | Admitting: Medical Oncology

## 2018-11-22 ENCOUNTER — Ambulatory Visit: Payer: 59

## 2018-11-22 ENCOUNTER — Other Ambulatory Visit: Payer: Self-pay | Admitting: Internal Medicine

## 2018-11-22 DIAGNOSIS — I2782 Chronic pulmonary embolism: Principal | ICD-10-CM

## 2018-11-22 DIAGNOSIS — I2609 Other pulmonary embolism with acute cor pulmonale: Secondary | ICD-10-CM

## 2018-11-22 MED ORDER — RIVAROXABAN 20 MG PO TABS: ORAL_TABLET | ORAL | 0 refills | Status: DC

## 2018-11-24 ENCOUNTER — Ambulatory Visit: Payer: 59

## 2018-11-24 ENCOUNTER — Inpatient Hospital Stay: Payer: 59

## 2018-11-24 VITALS — BP 125/93 | HR 109 | Temp 98.8°F | Resp 20

## 2018-11-24 DIAGNOSIS — C3492 Malignant neoplasm of unspecified part of left bronchus or lung: Secondary | ICD-10-CM

## 2018-11-24 DIAGNOSIS — C7931 Secondary malignant neoplasm of brain: Secondary | ICD-10-CM | POA: Diagnosis not present

## 2018-11-24 MED ORDER — SODIUM CHLORIDE 0.9 % IV SOLN
Freq: Once | INTRAVENOUS | Status: AC
  Administered 2018-11-24: 16:00:00 via INTRAVENOUS
  Filled 2018-11-24: qty 250

## 2018-11-24 MED ORDER — SODIUM CHLORIDE 0.9 % IV SOLN
480.0000 mg | Freq: Once | INTRAVENOUS | Status: AC
  Administered 2018-11-24: 480 mg via INTRAVENOUS
  Filled 2018-11-24: qty 48

## 2018-11-24 NOTE — Patient Instructions (Signed)
[facility] Discharge Instructions for Patients Receiving Chemotherapy  Today you received the following chemotherapy agents Nivolumab.  To help prevent nausea and vomiting after your treatment, we encourage you to take your nausea medication.   If you develop nausea and vomiting that is not controlled by your nausea medication, call the clinic.   BELOW ARE SYMPTOMS THAT SHOULD BE REPORTED IMMEDIATELY:  *FEVER GREATER THAN 100.5 F  *CHILLS WITH OR WITHOUT FEVER  NAUSEA AND VOMITING THAT IS NOT CONTROLLED WITH YOUR NAUSEA MEDICATION  *UNUSUAL SHORTNESS OF BREATH  *UNUSUAL BRUISING OR BLEEDING  TENDERNESS IN MOUTH AND THROAT WITH OR WITHOUT PRESENCE OF ULCERS  *URINARY PROBLEMS  *BOWEL PROBLEMS  UNUSUAL RASH Items with * indicate a potential emergency and should be followed up as soon as possible.  Feel free to call the clinic should you have any questions or concerns. The clinic phone number is [phone].  Please show the [facility] at check-in to the Emergency Department and triage nurse.

## 2018-11-24 NOTE — Progress Notes (Signed)
Per Dr Julien Nordmann ok to tx with heart rate of 109, no new orders

## 2018-11-30 ENCOUNTER — Telehealth: Payer: Self-pay | Admitting: Radiation Oncology

## 2018-11-30 NOTE — Telephone Encounter (Signed)
l called the patient's wife and LM to see how he's been doing with the scheduling of Zofran and prn use of Compazine and Ativan for his nausea.

## 2018-12-01 ENCOUNTER — Ambulatory Visit: Payer: 59 | Admitting: Internal Medicine

## 2018-12-01 ENCOUNTER — Ambulatory Visit: Payer: 59

## 2018-12-01 ENCOUNTER — Other Ambulatory Visit: Payer: 59

## 2018-12-13 ENCOUNTER — Ambulatory Visit: Payer: Self-pay | Admitting: Radiation Oncology

## 2018-12-13 ENCOUNTER — Telehealth: Payer: Self-pay | Admitting: Medical Oncology

## 2018-12-13 DIAGNOSIS — T7840XA Allergy, unspecified, initial encounter: Secondary | ICD-10-CM | POA: Diagnosis not present

## 2018-12-13 NOTE — Telephone Encounter (Signed)
New problems -Facial swelling including eyes , left ear w decreased hearing, no appetite , nausea, rumbling stomach,, LBM yesterday and normal  . Using eye packs over eyes and compazine for nausea, tums ,gas -x .  Plan- Seeing PCP today for ear. Mohamed to advise.

## 2018-12-19 ENCOUNTER — Encounter (HOSPITAL_COMMUNITY): Payer: Self-pay

## 2018-12-19 ENCOUNTER — Ambulatory Visit (HOSPITAL_COMMUNITY)
Admission: RE | Admit: 2018-12-19 | Discharge: 2018-12-19 | Disposition: A | Discharge status: Home / Self Care | Payer: 59 | Source: Ambulatory Visit | Attending: Internal Medicine | Admitting: Internal Medicine

## 2018-12-19 DIAGNOSIS — C3490 Malignant neoplasm of unspecified part of unspecified bronchus or lung: Secondary | ICD-10-CM | POA: Diagnosis not present

## 2018-12-19 MED ORDER — IOHEXOL 300 MG/ML  SOLN
100.0000 mL | Freq: Once | INTRAMUSCULAR | Status: AC | PRN
  Administered 2018-12-19: 100 mL via INTRAVENOUS

## 2018-12-19 MED ORDER — SODIUM CHLORIDE (PF) 0.9 % IJ SOLN
INTRAMUSCULAR | Status: AC
  Filled 2018-12-19: qty 50

## 2018-12-20 ENCOUNTER — Other Ambulatory Visit: Payer: Self-pay | Admitting: Radiation Oncology

## 2018-12-20 ENCOUNTER — Ambulatory Visit (HOSPITAL_COMMUNITY): Payer: 59

## 2018-12-20 DIAGNOSIS — R112 Nausea with vomiting, unspecified: Secondary | ICD-10-CM

## 2018-12-20 DIAGNOSIS — C3492 Malignant neoplasm of unspecified part of left bronchus or lung: Secondary | ICD-10-CM

## 2018-12-22 ENCOUNTER — Ambulatory Visit: Payer: 59 | Admitting: Internal Medicine

## 2018-12-22 ENCOUNTER — Other Ambulatory Visit: Payer: 59

## 2018-12-22 ENCOUNTER — Other Ambulatory Visit: Payer: Self-pay

## 2018-12-22 ENCOUNTER — Inpatient Hospital Stay: Payer: 59

## 2018-12-22 ENCOUNTER — Encounter: Payer: Self-pay | Admitting: Internal Medicine

## 2018-12-22 ENCOUNTER — Ambulatory Visit: Payer: 59

## 2018-12-22 ENCOUNTER — Inpatient Hospital Stay: Payer: 59 | Attending: Internal Medicine

## 2018-12-22 ENCOUNTER — Inpatient Hospital Stay (HOSPITAL_BASED_OUTPATIENT_CLINIC_OR_DEPARTMENT_OTHER): Payer: 59 | Admitting: Internal Medicine

## 2018-12-22 VITALS — HR 98

## 2018-12-22 VITALS — BP 122/89 | HR 110 | Temp 99.1°F | Resp 18 | Wt 241.0 lb

## 2018-12-22 DIAGNOSIS — C7B8 Other secondary neuroendocrine tumors: Secondary | ICD-10-CM | POA: Diagnosis not present

## 2018-12-22 DIAGNOSIS — Z923 Personal history of irradiation: Secondary | ICD-10-CM | POA: Diagnosis not present

## 2018-12-22 DIAGNOSIS — I10 Essential (primary) hypertension: Secondary | ICD-10-CM

## 2018-12-22 DIAGNOSIS — C7A1 Malignant poorly differentiated neuroendocrine tumors: Secondary | ICD-10-CM | POA: Diagnosis not present

## 2018-12-22 DIAGNOSIS — Z5112 Encounter for antineoplastic immunotherapy: Secondary | ICD-10-CM

## 2018-12-22 DIAGNOSIS — C3492 Malignant neoplasm of unspecified part of left bronchus or lung: Secondary | ICD-10-CM

## 2018-12-22 DIAGNOSIS — Z9221 Personal history of antineoplastic chemotherapy: Secondary | ICD-10-CM | POA: Insufficient documentation

## 2018-12-22 DIAGNOSIS — Z79899 Other long term (current) drug therapy: Secondary | ICD-10-CM

## 2018-12-22 DIAGNOSIS — R221 Localized swelling, mass and lump, neck: Secondary | ICD-10-CM | POA: Insufficient documentation

## 2018-12-22 DIAGNOSIS — R600 Localized edema: Secondary | ICD-10-CM

## 2018-12-22 LAB — CMP (CANCER CENTER ONLY)
ALT: 14 U/L (ref 0–44)
AST: 12 U/L — ABNORMAL LOW (ref 15–41)
Albumin: 3.6 g/dL (ref 3.5–5.0)
Alkaline Phosphatase: 77 U/L (ref 38–126)
Anion gap: 11 (ref 5–15)
BUN: 10 mg/dL (ref 6–20)
CO2: 23 mmol/L (ref 22–32)
Calcium: 9.1 mg/dL (ref 8.9–10.3)
Chloride: 104 mmol/L (ref 98–111)
Creatinine: 1.39 mg/dL — ABNORMAL HIGH (ref 0.61–1.24)
GFR, Est AFR Am: >60 mL/min (ref >60)
GFR, Estimated: 58 mL/min — ABNORMAL LOW (ref >60)
Glucose, Bld: 134 mg/dL — ABNORMAL HIGH (ref 70–99)
Potassium: 3.5 mmol/L (ref 3.5–5.1)
Sodium: 138 mmol/L (ref 135–145)
Total Bilirubin: 0.8 mg/dL (ref 0.3–1.2)
Total Protein: 6.8 g/dL (ref 6.5–8.1)

## 2018-12-22 LAB — CBC WITH DIFFERENTIAL (CANCER CENTER ONLY)
Abs Immature Granulocytes: 0.01 10*3/uL (ref 0.00–0.07)
Basophils Absolute: 0 10*3/uL (ref 0.0–0.1)
Basophils Relative: 0 %
Eosinophils Absolute: 0.2 10*3/uL (ref 0.0–0.5)
Eosinophils Relative: 5 %
HCT: 44.6 % (ref 39.0–52.0)
Hemoglobin: 14.9 g/dL (ref 13.0–17.0)
Immature Granulocytes: 0 %
Lymphocytes Relative: 15 %
Lymphs Abs: 0.8 10*3/uL (ref 0.7–4.0)
MCH: 34.7 pg — ABNORMAL HIGH (ref 26.0–34.0)
MCHC: 33.4 g/dL (ref 30.0–36.0)
MCV: 104 fL — ABNORMAL HIGH (ref 80.0–100.0)
Monocytes Absolute: 0.5 10*3/uL (ref 0.1–1.0)
Monocytes Relative: 10 %
Neutro Abs: 3.4 10*3/uL (ref 1.7–7.7)
Neutrophils Relative %: 70 %
Platelet Count: 176 10*3/uL (ref 150–400)
RBC: 4.29 MIL/uL (ref 4.22–5.81)
RDW: 12.2 % (ref 11.5–15.5)
WBC Count: 4.9 10*3/uL (ref 4.0–10.5)
nRBC: 0 % (ref 0.0–0.2)

## 2018-12-22 LAB — TSH: TSH: <0.08 u[IU]/mL — ABNORMAL LOW (ref 0.320–4.118)

## 2018-12-22 MED ORDER — PROCHLORPERAZINE MALEATE 10 MG PO TABS
10.0000 mg | ORAL_TABLET | Freq: Once | ORAL | Status: AC
  Administered 2018-12-22: 10 mg via ORAL

## 2018-12-22 MED ORDER — FUROSEMIDE 20 MG PO TABS: 20.0000 mg | ORAL_TABLET | Freq: Every day | ORAL | 0 refills | Status: DC | PRN

## 2018-12-22 MED ORDER — PROCHLORPERAZINE MALEATE 10 MG PO TABS
ORAL_TABLET | ORAL | Status: AC
  Filled 2018-12-22: qty 1

## 2018-12-22 MED ORDER — SODIUM CHLORIDE 0.9 % IV SOLN
Freq: Once | INTRAVENOUS | Status: AC
  Administered 2018-12-22: 13:00:00 via INTRAVENOUS
  Filled 2018-12-22: qty 250

## 2018-12-22 MED ORDER — SODIUM CHLORIDE 0.9 % IV SOLN
480.0000 mg | Freq: Once | INTRAVENOUS | Status: AC
  Administered 2018-12-22: 480 mg via INTRAVENOUS
  Filled 2018-12-22: qty 48

## 2018-12-22 NOTE — Progress Notes (Signed)
Ok to treat with elevated heart rate per Dr. Julien Nordmann

## 2018-12-22 NOTE — Progress Notes (Signed)
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  PRIOR THERAPY:  1) Stereotactic radiotherapy to a solitary brain metastasis under the care of Dr. Lisbeth Renshaw on 01/25/2017. 2) Short course of concurrent chemoradiation with weekly carboplatin for AUC of 2 and paclitaxel 45 MG/M2 to the locally advanced disease in the chest. Status post 3 cycles. 3) Systemic chemotherapy with carboplatin for AUC of 5, Alimta 500 MG/M2 and Avastin 15 MG/KG every 3 weeks. First dose of 02/15/2017. Status post 6 cycles. Last dose 06/08/2017 with stable disease. 4) whole brain irradiation under the care of Dr. Lisbeth Renshaw expected to complete on October 14, 2018. 5) Maintenance systemic chemotherapy with Alimta 500 MG/M2 in addition to Avastin 15 MG/KG every 3 weeks. First dose 07/13/2017.  Status post 20 cycles.  Starting from cycle #16 he will be treated with single agent Alimta only secondary to intolerance.   CURRENT THERAPY: Second line treatment with immunotherapy with nivolumab 480 mg IV every 4 weeks.  First dose October 26, 2018.  Status post 2 cycles.  INTERVAL HISTORY: Calvin Wagner 53 y.o. male returns to the clinic today for follow-up visit accompanied by his wife.  The patient is feeling fine today with no concerning complaints except for swelling of his neck and eyelids more on the left side.  He denied having any nausea, vomiting, diarrhea or constipation.  He denied having any fever or chills.  He has no headache or visual changes.  The patient has no chest pain, shortness breath,  cough or hemoptysis.  He has no recent weight loss or night sweats.  He has been tolerating his treatment with nivolumab fairly well.  He had repeat CT scan of the chest, abdomen and pelvis performed recently and he is here for evaluation and discussion of his scan results.  MEDICAL HISTORY: Past Medical History:  Diagnosis Date   Encounter for antineoplastic chemotherapy 12/31/2016   GERD 11/08/2009   Qualifier: Diagnosis of  By: Ronnald Ramp MD, Arvid Right.    Goals of care, counseling/discussion 12/31/2016   History of radiation therapy 01/13/2017 - 02/02/2017   Site/dose:  Left lung: 30 Gy in 15 fractions   HYPERTENSION 11/08/2009   Qualifier: Diagnosis of  By: Ronnald Ramp MD, Arvid Right.    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  MEDICATIONS:  Current Outpatient Medications  Medication Sig Dispense Refill   albuterol (PROVENTIL HFA;VENTOLIN HFA) 108 (90 Base) MCG/ACT inhaler Inhale 2 puffs into the lungs every 4 (four) hours as needed.     amLODipine (NORVASC) 5 MG tablet Take 5 mg by mouth daily.     budesonide-formoterol (SYMBICORT) 80-4.5 MCG/ACT inhaler Inhale 2 puffs into the lungs 2 (two) times daily. 1 Inhaler 5   famotidine (PEPCID) 20 MG tablet Take 20 mg by mouth 2 (two) times daily.     furosemide (LASIX) 20 MG tablet TAKE 1 TABLET BY MOUTH EVERY DAY AS NEEDED  20 tablet 0   LORazepam (ATIVAN) 1 MG tablet Take 1-1.5 tablets (1-1.5 mg total) by mouth every 4 (four) hours as needed for anxiety (30 minutes prior to radiotherapy or MRI scans, and nausea). 120 tablet 0   losartan (COZAAR) 50 MG tablet Take 50 mg by mouth daily.     Multiple Vitamin (MULTIVITAMIN WITH MINERALS) TABS tablet Take 1 tablet by mouth daily.     ondansetron (ZOFRAN) 8 MG tablet TAKE 1 TABLET BY MOUTH EVERY 8 HOURS AS NEEDED FOR NAUSEA OR  VOMITING. 90 tablet 0   pantoprazole (PROTONIX) 40 MG tablet Take 40 mg by mouth daily.  2   PROAIR HFA 108 (90 Base) MCG/ACT inhaler Inhale 2 puffs into the lungs every 4 (four) hours as needed.  5   prochlorperazine (COMPAZINE) 10 MG tablet TAKE 1 TABLET BY MOUTH  EVERY 6 HOURS AS NEEDED FOR NAUSEA OR VOMITING 90 tablet 0   rivaroxaban (XARELTO) 20 MG TABS tablet TAKE 1 TABLET EVERY DAY WITH SUPPER 90 tablet 0   budesonide (RA BUDESONIDE) 32 MCG/ACT nasal spray Place into both nostrils daily.     BUDESONIDE PO Take by mouth. Budesonide slurry     chlorpheniramine-HYDROcodone (TUSSIONEX) 10-8 MG/5ML SUER Take 5 mLs by mouth every 12 (twelve) hours as needed for cough. (Patient not taking: Reported on 3/73/4287) 681 mL 0   folic acid (FOLVITE) 1 MG tablet Take 1 tablet (1 mg total) by mouth daily. (Patient not taking: Reported on 12/22/2018) 30 tablet 4   magic mouthwash SOLN Take 5 mLs by mouth 4 (four) times daily as needed for mouth pain. 1:1:1 mix-Nystatin. Benadryl and extra strength maalox. (Patient not taking: Reported on 12/22/2018) 240 mL 0   traMADol (ULTRAM) 50 MG tablet TAKE 1 TABLET BY MOUTH EVERY 12 HOURS AS NEEDED (Patient not taking: Reported on 12/22/2018) 30 tablet 0   No current facility-administered medications for this visit.     SURGICAL HISTORY:  Past Surgical History:  Procedure Laterality Date   INGUINAL HERNIA REPAIR     SHOULDER ARTHROSCOPY WITH ROTATOR CUFF REPAIR Left 11/12/2016   Procedure: SHOULDER ARTHROSCOPY WITH ROTATOR CUFF REPAIR AND SUBACROMIAL DECOMPRESSION;  Surgeon: Tania Ade, MD;  Location: [facility];  Service: Orthopedics;  Laterality: Left;  SHOULDER ARTHROSCOPY WITH ROTATOR CUFF REPAIR AND SUBACROMIAL DECOMPRESSION   TONSILLECTOMY     TOTAL KNEE ARTHROPLASTY     VIDEO BRONCHOSCOPY Bilateral 12/10/2016   Procedure: VIDEO BRONCHOSCOPY WITHOUT FLUORO;  Surgeon: Tanda Rockers, MD;  Location: [facility];  Service: Cardiopulmonary;   Laterality: Bilateral;    REVIEW OF SYSTEMS:  Constitutional: positive for fatigue Eyes: positive for Swelling of the left eyelid Ears, nose, mouth, throat, and face: negative Respiratory: negative Cardiovascular: negative Gastrointestinal: negative Genitourinary:negative Integument/breast: negative Hematologic/lymphatic: negative Musculoskeletal:negative Neurological: negative Behavioral/Psych: negative Endocrine: negative Allergic/Immunologic: negative   PHYSICAL EXAMINATION: General appearance: alert, cooperative, fatigued and no distress Head: Normocephalic, without obvious abnormality, atraumatic Neck: no adenopathy, no JVD, supple, symmetrical, trachea midline and thyroid not enlarged, symmetric, no tenderness/mass/nodules Lymph nodes: Cervical, supraclavicular, and axillary nodes normal. Resp: clear to auscultation bilaterally Back: symmetric, no curvature. ROM normal. No CVA tenderness. Cardio: regular rate and rhythm, S1, S2 normal, no murmur, click, rub or gallop GI: soft, non-tender; bowel sounds normal; no masses,  no organomegaly Extremities: extremities normal, atraumatic, no cyanosis or edema Neurologic: Alert and oriented X 3, normal strength and tone. Normal symmetric reflexes. Normal coordination and gait  ECOG PERFORMANCE STATUS: 1 - Symptomatic but completely  ambulatory  Blood pressure 122/89, pulse (!) 110, temperature 99.1 F (37.3 C), temperature source Oral, resp. rate 18, weight 241 lb 0.2 oz (109.3 kg), SpO2 100 %.  LABORATORY DATA: Lab Results  Component Value Date   WBC 4.9 12/22/2018   HGB 14.9 12/22/2018   HCT 44.6 12/22/2018   MCV 104.0 (H) 12/22/2018   PLT 176 12/22/2018      Chemistry      Component Value Date/Time   NA 138 11/21/2018 1424   NA 138 10/08/2017 0907   K 4.3 11/21/2018 1424   K 4.1 10/08/2017 0907   CL 102 11/21/2018 1424   CO2 27 11/21/2018 1424   CO2 24 10/08/2017 0907   BUN 10 11/21/2018 1424   BUN 11.7  10/08/2017 0907   CREATININE 1.35 (H) 11/21/2018 1424   CREATININE 1.2 10/08/2017 0907      Component Value Date/Time   CALCIUM 9.5 11/21/2018 1424   CALCIUM 9.5 10/08/2017 0907   ALKPHOS 85 11/21/2018 1424   ALKPHOS 88 10/08/2017 0907   AST 17 11/21/2018 1424   AST 22 10/08/2017 0907   ALT 17 11/21/2018 1424   ALT 24 10/08/2017 0907   BILITOT 0.5 11/21/2018 1424   BILITOT 0.42 10/08/2017 0907       RADIOGRAPHIC STUDIES: Ct Chest W Contrast  Result Date: 12/20/2018 CLINICAL DATA:  Followup non-small cell lung cancer. EXAM: CT CHEST, ABDOMEN, AND PELVIS WITH CONTRAST TECHNIQUE: Multidetector CT imaging of the chest, abdomen and pelvis was performed following the standard protocol during bolus administration of intravenous contrast. CONTRAST:  132mL OMNIPAQUE IOHEXOL 300 MG/ML  SOLN COMPARISON:  10/19/2018 FINDINGS: CT CHEST FINDINGS Cardiovascular: Normal heart size. No pericardial effusion. Similar appearance of eccentric wall thickening involving the lingular and left lower lobe branches of the left pulmonary artery Mediastinum/Nodes: Normal appearance of the thyroid gland. The trachea appears patent and is midline. Normal appearance of the esophagus. Index left hilar lymph node measures 1.3 cm, image 32/2. Unchanged. Left infrahilar node measures 1.3 cm, image 35/2. Previously 1.2 cm. Lungs/Pleura: No pleural effusion. Paramediastinal radiation change with fibrosis and ground-glass attenuation within the left lung. Left posterolateral lung base index lesion measures 0.6 cm, image 121/6. Previously 1.7 cm. No new lesions identified. Musculoskeletal: No chest wall mass or suspicious bone lesions identified. CT ABDOMEN PELVIS FINDINGS Hepatobiliary: Lesion within segment 5 measures 2.1 by 1.7 cm, image 68/2. Previously 1.9 by 1.5 cm. No new liver lesions. Gallbladder normal. No biliary dilatation. Pancreas: Unremarkable. No pancreatic ductal dilatation or surrounding inflammatory changes.  Spleen: Normal in size without focal abnormality. Adrenals/Urinary Tract: Normal appearance of the adrenal glands. No hydronephrosis identified bilaterally. Exophytic cystic lesion arising from inferior pole of right kidney measures 1.5 cm, image 84/2. Stomach/Bowel: Stomach is within normal limits. Appendix appears normal. No evidence of bowel wall thickening, distention, or inflammatory changes. Vascular/Lymphatic: Mild aortic atherosclerosis. No aneurysm. No abdominopelvic adenopathy. Reproductive: Prostate is unremarkable. Other: Previous bilateral inguinal herniorrhaphy. Musculoskeletal: Lumbar spondylosis. No acute or suspicious bone lesions. IMPRESSION: 1. The index nodule within the left lower lobe has decreased in size from previous exam. Stable left hilar adenopathy. 2. Segment 5 liver lesion demonstrates mild increase in size in the interval. Electronically Signed   By: Kerby Moors M.D.   On: 12/20/2018 08:46   Ct Abdomen Pelvis W Contrast  Result Date: 12/20/2018 CLINICAL DATA:  Followup non-small cell lung cancer. EXAM: CT CHEST, ABDOMEN, AND PELVIS WITH CONTRAST TECHNIQUE: Multidetector CT imaging of the chest, abdomen and  pelvis was performed following the standard protocol during bolus administration of intravenous contrast. CONTRAST:  14mL OMNIPAQUE IOHEXOL 300 MG/ML  SOLN COMPARISON:  10/19/2018 FINDINGS: CT CHEST FINDINGS Cardiovascular: Normal heart size. No pericardial effusion. Similar appearance of eccentric wall thickening involving the lingular and left lower lobe branches of the left pulmonary artery Mediastinum/Nodes: Normal appearance of the thyroid gland. The trachea appears patent and is midline. Normal appearance of the esophagus. Index left hilar lymph node measures 1.3 cm, image 32/2. Unchanged. Left infrahilar node measures 1.3 cm, image 35/2. Previously 1.2 cm. Lungs/Pleura: No pleural effusion. Paramediastinal radiation change with fibrosis and ground-glass attenuation  within the left lung. Left posterolateral lung base index lesion measures 0.6 cm, image 121/6. Previously 1.7 cm. No new lesions identified. Musculoskeletal: No chest wall mass or suspicious bone lesions identified. CT ABDOMEN PELVIS FINDINGS Hepatobiliary: Lesion within segment 5 measures 2.1 by 1.7 cm, image 68/2. Previously 1.9 by 1.5 cm. No new liver lesions. Gallbladder normal. No biliary dilatation. Pancreas: Unremarkable. No pancreatic ductal dilatation or surrounding inflammatory changes. Spleen: Normal in size without focal abnormality. Adrenals/Urinary Tract: Normal appearance of the adrenal glands. No hydronephrosis identified bilaterally. Exophytic cystic lesion arising from inferior pole of right kidney measures 1.5 cm, image 84/2. Stomach/Bowel: Stomach is within normal limits. Appendix appears normal. No evidence of bowel wall thickening, distention, or inflammatory changes. Vascular/Lymphatic: Mild aortic atherosclerosis. No aneurysm. No abdominopelvic adenopathy. Reproductive: Prostate is unremarkable. Other: Previous bilateral inguinal herniorrhaphy. Musculoskeletal: Lumbar spondylosis. No acute or suspicious bone lesions. IMPRESSION: 1. The index nodule within the left lower lobe has decreased in size from previous exam. Stable left hilar adenopathy. 2. Segment 5 liver lesion demonstrates mild increase in size in the interval. Electronically Signed   By: Kerby Moors M.D.   On: 12/20/2018 08:46    ASSESSMENT AND PLAN: This is a very pleasant 53 years old white male with stage IV non-small cell lung cancer, high-grade neuroendocrine carcinoma presented with locally advanced disease in the chest as well as solitary brain metastasis and 2 liver lesions. The patient is status post stereotactic radiotherapy to the solitary brain metastasis as well as short course of concurrent chemoradiation to the locally advanced disease in the chest. He recently completed 6 cycles of systemic chemotherapy  with carboplatin, Alimta and Avastin and tolerated this treatment fairly well. He was started on maintenance treatment with Alimta and Avastin status post 15 cycles.  Avastin was discontinued secondary to intolerance to the combination treatment.  He is currently on treatment with single agent Alimta status post 5 cycles. He has been tolerating his treatment well. Unfortunately he was recently found to have multiple metastatic brain lesion and the patient underwent whole brain irradiation. His scan showed evidence for disease progression with enlargement of left lower lobe lung nodule in addition to hilar lymphadenopathy as well as progression of the liver lesion. He was started on treatment with nivolumab 480 mg IV every 4 weeks status post 2 cycles.  The patient continues to tolerate this treatment well with no concerning adverse effects. He had repeat CT scan of the chest, abdomen and pelvis performed recently.  I personally and independently reviewed the scan and discussed the results with the patient and his wife. His scan showed no concerning findings for disease progression and there was some mild improvement in the left lung mass.  There was very mild increase by 2 mm in the index liver lesion. I recommended for the patient to continue his current treatment with  nivolumab and he will proceed with cycle #3 today. For the swelling of the neck and eyelid especially in the morning, I gave the patient prescription for Lasix to be used on as-needed basis.  He was also advised to increase his potassium rich diet.  If no improvement in the swelling, I may consider the patient for CT scan of the neck for further evaluation. The patient will come back for follow-up visit in 4 weeks for evaluation with the next cycle of his treatment. He was advised to call immediately if he has any concerning symptoms in the interval. The patient voices understanding of current disease status and treatment options and is  in agreement with the current care plan. All questions were answered. The patient knows to call the clinic with any problems, questions or concerns. We can certainly see the patient much sooner if necessary.  Disclaimer: This note was dictated with voice recognition software. Similar sounding words can inadvertently be transcribed and may not be corrected upon review.

## 2018-12-22 NOTE — Patient Instructions (Signed)
[facility] Discharge Instructions for Patients Receiving Chemotherapy  Today you received the following chemotherapy agents Nivolumab.  To help prevent nausea and vomiting after your treatment, we encourage you to take your nausea medication.   If you develop nausea and vomiting that is not controlled by your nausea medication, call the clinic.   BELOW ARE SYMPTOMS THAT SHOULD BE REPORTED IMMEDIATELY:  *FEVER GREATER THAN 100.5 F  *CHILLS WITH OR WITHOUT FEVER  NAUSEA AND VOMITING THAT IS NOT CONTROLLED WITH YOUR NAUSEA MEDICATION  *UNUSUAL SHORTNESS OF BREATH  *UNUSUAL BRUISING OR BLEEDING  TENDERNESS IN MOUTH AND THROAT WITH OR WITHOUT PRESENCE OF ULCERS  *URINARY PROBLEMS  *BOWEL PROBLEMS  UNUSUAL RASH Items with * indicate a potential emergency and should be followed up as soon as possible.  Feel free to call the clinic should you have any questions or concerns. The clinic phone number is [phone].  Please show the [facility] at check-in to the Emergency Department and triage nurse.

## 2018-12-23 ENCOUNTER — Telehealth: Payer: Self-pay | Admitting: Internal Medicine

## 2018-12-23 NOTE — Telephone Encounter (Signed)
Scheduled appt per 3/12 los - pt to get an updated schedule next visit.

## 2019-01-09 ENCOUNTER — Other Ambulatory Visit: Payer: Self-pay | Admitting: Radiation Therapy

## 2019-01-12 ENCOUNTER — Other Ambulatory Visit: Payer: Self-pay

## 2019-01-12 ENCOUNTER — Ambulatory Visit
Admission: RE | Admit: 2019-01-12 | Discharge: 2019-01-12 | Disposition: A | Discharge status: Home / Self Care | Payer: 59 | Source: Ambulatory Visit | Attending: Radiation Oncology | Admitting: Radiation Oncology

## 2019-01-12 DIAGNOSIS — D4989 Neoplasm of unspecified behavior of other specified sites: Secondary | ICD-10-CM

## 2019-01-12 MED ORDER — GADOBENATE DIMEGLUMINE 529 MG/ML IV SOLN
20.0000 mL | Freq: Once | INTRAVENOUS | Status: AC | PRN
  Administered 2019-01-12: 20 mL via INTRAVENOUS

## 2019-01-14 ENCOUNTER — Other Ambulatory Visit: Payer: Self-pay | Admitting: Internal Medicine

## 2019-01-14 ENCOUNTER — Other Ambulatory Visit: Payer: Self-pay | Admitting: Radiation Oncology

## 2019-01-14 DIAGNOSIS — C3492 Malignant neoplasm of unspecified part of left bronchus or lung: Secondary | ICD-10-CM

## 2019-01-14 DIAGNOSIS — R112 Nausea with vomiting, unspecified: Secondary | ICD-10-CM

## 2019-01-16 ENCOUNTER — Telehealth: Payer: Self-pay | Admitting: Radiation Oncology

## 2019-01-16 ENCOUNTER — Inpatient Hospital Stay: Payer: 59 | Attending: Internal Medicine

## 2019-01-16 DIAGNOSIS — Z79899 Other long term (current) drug therapy: Secondary | ICD-10-CM | POA: Insufficient documentation

## 2019-01-16 DIAGNOSIS — Z5112 Encounter for antineoplastic immunotherapy: Secondary | ICD-10-CM | POA: Insufficient documentation

## 2019-01-16 DIAGNOSIS — C7A1 Malignant poorly differentiated neuroendocrine tumors: Secondary | ICD-10-CM | POA: Insufficient documentation

## 2019-01-16 DIAGNOSIS — I10 Essential (primary) hypertension: Secondary | ICD-10-CM | POA: Insufficient documentation

## 2019-01-16 DIAGNOSIS — Z7901 Long term (current) use of anticoagulants: Secondary | ICD-10-CM | POA: Insufficient documentation

## 2019-01-16 DIAGNOSIS — C7B8 Other secondary neuroendocrine tumors: Secondary | ICD-10-CM | POA: Insufficient documentation

## 2019-01-16 NOTE — Telephone Encounter (Signed)
I called the patient'swife and let her know the results from Mr. Proffit MRI and that we would repeat in 3 months.

## 2019-01-19 ENCOUNTER — Inpatient Hospital Stay: Payer: 59

## 2019-01-19 ENCOUNTER — Encounter: Payer: Self-pay | Admitting: Internal Medicine

## 2019-01-19 ENCOUNTER — Inpatient Hospital Stay (HOSPITAL_BASED_OUTPATIENT_CLINIC_OR_DEPARTMENT_OTHER): Payer: 59 | Admitting: Internal Medicine

## 2019-01-19 ENCOUNTER — Other Ambulatory Visit: Payer: Self-pay

## 2019-01-19 ENCOUNTER — Ambulatory Visit: Payer: 59 | Attending: Radiation Oncology | Admitting: Radiation Oncology

## 2019-01-19 VITALS — HR 99

## 2019-01-19 VITALS — BP 122/94 | HR 110 | Temp 98.4°F | Resp 20 | Ht 73.0 in | Wt 230.8 lb

## 2019-01-19 DIAGNOSIS — I10 Essential (primary) hypertension: Secondary | ICD-10-CM | POA: Diagnosis not present

## 2019-01-19 DIAGNOSIS — Z79899 Other long term (current) drug therapy: Secondary | ICD-10-CM | POA: Diagnosis not present

## 2019-01-19 DIAGNOSIS — C3492 Malignant neoplasm of unspecified part of left bronchus or lung: Secondary | ICD-10-CM

## 2019-01-19 DIAGNOSIS — C3490 Malignant neoplasm of unspecified part of unspecified bronchus or lung: Secondary | ICD-10-CM | POA: Diagnosis not present

## 2019-01-19 DIAGNOSIS — Z5112 Encounter for antineoplastic immunotherapy: Secondary | ICD-10-CM | POA: Diagnosis present

## 2019-01-19 DIAGNOSIS — R110 Nausea: Secondary | ICD-10-CM | POA: Diagnosis not present

## 2019-01-19 DIAGNOSIS — C7931 Secondary malignant neoplasm of brain: Secondary | ICD-10-CM

## 2019-01-19 DIAGNOSIS — Z7901 Long term (current) use of anticoagulants: Secondary | ICD-10-CM | POA: Diagnosis not present

## 2019-01-19 DIAGNOSIS — R109 Unspecified abdominal pain: Secondary | ICD-10-CM | POA: Diagnosis not present

## 2019-01-19 DIAGNOSIS — C7A1 Malignant poorly differentiated neuroendocrine tumors: Secondary | ICD-10-CM

## 2019-01-19 DIAGNOSIS — C7B8 Other secondary neuroendocrine tumors: Secondary | ICD-10-CM

## 2019-01-19 LAB — CMP (CANCER CENTER ONLY)
ALT: 21 U/L (ref 0–44)
AST: 15 U/L (ref 15–41)
Albumin: 3.6 g/dL (ref 3.5–5.0)
Alkaline Phosphatase: 81 U/L (ref 38–126)
Anion gap: 11 (ref 5–15)
BUN: 8 mg/dL (ref 6–20)
CO2: 22 mmol/L (ref 22–32)
Calcium: 9.3 mg/dL (ref 8.9–10.3)
Chloride: 106 mmol/L (ref 98–111)
Creatinine: 1.33 mg/dL — ABNORMAL HIGH (ref 0.61–1.24)
GFR, Est AFR Am: >60 mL/min (ref >60)
GFR, Estimated: >60 mL/min (ref >60)
Glucose, Bld: 132 mg/dL — ABNORMAL HIGH (ref 70–99)
Potassium: 3.5 mmol/L (ref 3.5–5.1)
Sodium: 139 mmol/L (ref 135–145)
Total Bilirubin: 0.5 mg/dL (ref 0.3–1.2)
Total Protein: 6.9 g/dL (ref 6.5–8.1)

## 2019-01-19 LAB — CBC WITH DIFFERENTIAL (CANCER CENTER ONLY)
Abs Immature Granulocytes: 0.01 10*3/uL (ref 0.00–0.07)
Basophils Absolute: 0 10*3/uL (ref 0.0–0.1)
Basophils Relative: 0 %
Eosinophils Absolute: 0.1 10*3/uL (ref 0.0–0.5)
Eosinophils Relative: 4 %
HCT: 41 % (ref 39.0–52.0)
Hemoglobin: 13.9 g/dL (ref 13.0–17.0)
Immature Granulocytes: 0 %
Lymphocytes Relative: 22 %
Lymphs Abs: 0.8 10*3/uL (ref 0.7–4.0)
MCH: 33.3 pg (ref 26.0–34.0)
MCHC: 33.9 g/dL (ref 30.0–36.0)
MCV: 98.1 fL (ref 80.0–100.0)
Monocytes Absolute: 0.5 10*3/uL (ref 0.1–1.0)
Monocytes Relative: 13 %
Neutro Abs: 2.3 10*3/uL (ref 1.7–7.7)
Neutrophils Relative %: 61 %
Platelet Count: 176 10*3/uL (ref 150–400)
RBC: 4.18 MIL/uL — ABNORMAL LOW (ref 4.22–5.81)
RDW: 11.6 % (ref 11.5–15.5)
WBC Count: 3.9 10*3/uL — ABNORMAL LOW (ref 4.0–10.5)
nRBC: 0 % (ref 0.0–0.2)

## 2019-01-19 LAB — TSH: TSH: <0.08 u[IU]/mL — ABNORMAL LOW (ref 0.320–4.118)

## 2019-01-19 MED ORDER — SODIUM CHLORIDE 0.9 % IV SOLN
480.0000 mg | Freq: Once | INTRAVENOUS | Status: AC
  Administered 2019-01-19: 480 mg via INTRAVENOUS
  Filled 2019-01-19: qty 48

## 2019-01-19 MED ORDER — SODIUM CHLORIDE 0.9 % IV SOLN
Freq: Once | INTRAVENOUS | Status: AC
  Administered 2019-01-19: 12:00:00 via INTRAVENOUS
  Filled 2019-01-19: qty 250

## 2019-01-19 NOTE — Progress Notes (Signed)
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  PRIOR THERAPY:  1) Stereotactic radiotherapy to a solitary brain metastasis under the care of Dr. Lisbeth Renshaw on 01/25/2017. 2) Short course of concurrent chemoradiation with weekly carboplatin for AUC of 2 and paclitaxel 45 MG/M2 to the locally advanced disease in the chest. Status post 3 cycles. 3) Systemic chemotherapy with carboplatin for AUC of 5, Alimta 500 MG/M2 and Avastin 15 MG/KG every 3 weeks. First dose of 02/15/2017. Status post 6 cycles. Last dose 06/08/2017 with stable disease. 4) whole brain irradiation under the care of Dr. Lisbeth Renshaw expected to complete on October 14, 2018. 5) Maintenance systemic chemotherapy with Alimta 500 MG/M2 in addition to Avastin 15 MG/KG every 3 weeks. First dose 07/13/2017.  Status post 20 cycles.  Starting from cycle #16 he will be treated with single agent Alimta only secondary to intolerance.   CURRENT THERAPY: Second line treatment with immunotherapy with nivolumab 480 mg IV every 4 weeks.  First dose October 26, 2018.  Status post 3 cycles.  INTERVAL HISTORY: Calvin Wagner 53 y.o. male returns to the clinic today for follow-up visit.  The patient is feeling fine today with no concerning complaints except for mild abdominal pain and indigestion.  He is followed by Dr. Penelope Coop, with [facility] gastroenterology.  He denied having any nausea, vomiting, diarrhea or constipation.  He denied having any chest pain, shortness of breath, cough or hemoptysis.  He has no fever or chills.  He  continues to tolerate his treatment with nivolumab fairly well.  The patient is here today for evaluation before starting cycle #4.  MEDICAL HISTORY: Past Medical History:  Diagnosis Date   Encounter for antineoplastic chemotherapy 12/31/2016   GERD 11/08/2009   Qualifier: Diagnosis of  By: Ronnald Ramp MD, Arvid Right.    Goals of care, counseling/discussion 12/31/2016   History of radiation therapy 01/13/2017 - 02/02/2017   Site/dose:  Left lung: 30 Gy in 15 fractions   HYPERTENSION 11/08/2009   Qualifier: Diagnosis of  By: Ronnald Ramp MD, Arvid Right.    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  MEDICATIONS:  Current Outpatient Medications  Medication Sig Dispense Refill   albuterol (PROVENTIL HFA;VENTOLIN HFA) 108 (90 Base) MCG/ACT inhaler Inhale 2 puffs into the lungs every 4 (four) hours as needed.     amLODipine (NORVASC) 5 MG tablet Take 5 mg by mouth daily.     budesonide (RA BUDESONIDE) 32 MCG/ACT nasal spray Place into both nostrils daily.     BUDESONIDE PO Take by mouth. Budesonide slurry     budesonide-formoterol (SYMBICORT) 80-4.5 MCG/ACT inhaler Inhale 2 puffs into the lungs 2 (two) times daily. 1 Inhaler 5   chlorpheniramine-HYDROcodone (TUSSIONEX) 10-8 MG/5ML SUER Take 5 mLs by mouth every 12 (twelve) hours as needed for cough. (Patient not taking: Reported on 12/22/2018) 473 mL 0   famotidine (PEPCID) 20 MG tablet Take 20 mg  by mouth 2 (two) times daily.     folic acid (FOLVITE) 1 MG tablet Take 1 tablet (1 mg total) by mouth daily. (Patient not taking: Reported on 12/22/2018) 30 tablet 4   furosemide (LASIX) 20 MG tablet Take 1 tablet (20 mg total) by mouth daily as needed. 20 tablet 0   LORazepam (ATIVAN) 1 MG tablet Take 1-1.5 tablets (1-1.5 mg total) by mouth every 4 (four) hours as needed for anxiety (30  minutes prior to radiotherapy or MRI scans, and nausea). 120 tablet 0   losartan (COZAAR) 50 MG tablet Take 50 mg by mouth daily.     magic mouthwash SOLN Take 5 mLs by mouth 4 (four) times daily as needed for mouth pain. 1:1:1 mix-Nystatin. Benadryl and extra strength maalox. (Patient not taking: Reported on 12/22/2018) 240 mL 0   Multiple Vitamin (MULTIVITAMIN WITH MINERALS) TABS tablet Take 1 tablet by mouth daily.     ondansetron (ZOFRAN) 8 MG tablet TAKE 1 TABLET BY MOUTH EVERY 8 HOURS AS NEEDED FOR NAUSEA OR VOMITING. 90 tablet 0   pantoprazole (PROTONIX) 40 MG tablet Take 40 mg by mouth daily.  2   PROAIR HFA 108 (90 Base) MCG/ACT inhaler Inhale 2 puffs into the lungs every 4 (four) hours as needed.  5   prochlorperazine (COMPAZINE) 10 MG tablet TAKE 1 TABLET BY MOUTH EVERY 6 HOURS AS NEEDED FOR NAUSEA OR VOMITING 90 tablet 0   rivaroxaban (XARELTO) 20 MG TABS tablet TAKE 1 TABLET EVERY DAY WITH SUPPER 90 tablet 0   traMADol (ULTRAM) 50 MG tablet TAKE 1 TABLET BY MOUTH EVERY 12 HOURS AS NEEDED (Patient not taking: Reported on 12/22/2018) 30 tablet 0   No current facility-administered medications for this visit.     SURGICAL HISTORY:  Past Surgical History:  Procedure Laterality Date   INGUINAL HERNIA REPAIR     SHOULDER ARTHROSCOPY WITH ROTATOR CUFF REPAIR Left 11/12/2016   Procedure: SHOULDER ARTHROSCOPY WITH ROTATOR CUFF REPAIR AND SUBACROMIAL DECOMPRESSION;  Surgeon: Tania Ade, MD;  Location: [facility];  Service: Orthopedics;  Laterality: Left;  SHOULDER ARTHROSCOPY WITH ROTATOR CUFF REPAIR AND SUBACROMIAL DECOMPRESSION   TONSILLECTOMY     TOTAL KNEE ARTHROPLASTY     VIDEO BRONCHOSCOPY Bilateral 12/10/2016   Procedure: VIDEO BRONCHOSCOPY WITHOUT FLUORO;  Surgeon: Tanda Rockers, MD;  Location: [facility];  Service: Cardiopulmonary;  Laterality: Bilateral;    REVIEW OF SYSTEMS:  A comprehensive review of systems was negative except for: Constitutional: positive for  fatigue Gastrointestinal: positive for abdominal pain   PHYSICAL EXAMINATION: General appearance: alert, cooperative, fatigued and no distress Head: Normocephalic, without obvious abnormality, atraumatic Neck: no adenopathy, no JVD, supple, symmetrical, trachea midline and thyroid not enlarged, symmetric, no tenderness/mass/nodules Lymph nodes: Cervical, supraclavicular, and axillary nodes normal. Resp: clear to auscultation bilaterally Back: symmetric, no curvature. ROM normal. No CVA tenderness. Cardio: regular rate and rhythm, S1, S2 normal, no murmur, click, rub or gallop GI: soft, non-tender; bowel sounds normal; no masses,  no organomegaly Extremities: extremities normal, atraumatic, no cyanosis or edema  ECOG PERFORMANCE STATUS: 1 - Symptomatic but completely ambulatory  Blood pressure (!) 122/94, pulse (!) 110, temperature 98.4 F (36.9 C), temperature source Oral, resp. rate 20, height 6\' 1"  (1.854 m), weight 230 lb 12.8 oz (104.7 kg), SpO2 98 %.  LABORATORY DATA: Lab Results  Component Value Date   WBC 3.9 (L) 01/19/2019   HGB 13.9 01/19/2019   HCT 41.0 01/19/2019   MCV 98.1 01/19/2019   PLT 176  01/19/2019      Chemistry      Component Value Date/Time   NA 138 12/22/2018 1048   NA 138 10/08/2017 0907   K 3.5 12/22/2018 1048   K 4.1 10/08/2017 0907   CL 104 12/22/2018 1048   CO2 23 12/22/2018 1048   CO2 24 10/08/2017 0907   BUN 10 12/22/2018 1048   BUN 11.7 10/08/2017 0907   CREATININE 1.39 (H) 12/22/2018 1048   CREATININE 1.2 10/08/2017 0907      Component Value Date/Time   CALCIUM 9.1 12/22/2018 1048   CALCIUM 9.5 10/08/2017 0907   ALKPHOS 77 12/22/2018 1048   ALKPHOS 88 10/08/2017 0907   AST 12 (L) 12/22/2018 1048   AST 22 10/08/2017 0907   ALT 14 12/22/2018 1048   ALT 24 10/08/2017 0907   BILITOT 0.8 12/22/2018 1048   BILITOT 0.42 10/08/2017 5102       RADIOGRAPHIC STUDIES: Mr Jeri Cos HE Contrast  Result Date: 01/12/2019 CLINICAL DATA:   Continued surveillance intracranial metastatic disease. Whole-brain radiotherapy completed 10/14/2018. EXAM: MRI HEAD WITHOUT AND WITH CONTRAST TECHNIQUE: Multiplanar, multiecho pulse sequences of the brain and surrounding structures were obtained without and with intravenous contrast. CONTRAST:  88mL MULTIHANCE GADOBENATE DIMEGLUMINE 529 MG/ML IV SOLN COMPARISON:  Multiple priors, most recent 09/21/2018. FINDINGS: Brain: All of the 11 new lesions identified on the previous exam are significantly decreased in size or resolved. Single arrows are placed on each slice at the location of previously identified tumor. Single previously treated LEFT frontal metastasis remains nonenhancing. No new lesions. No significant white matter disease. Vascular: Normal flow voids. Skull and upper cervical spine: Normal marrow signal. Sinuses/Orbits: Negative. Other: None IMPRESSION: Improved/resolved intracranial metastatic deposits as described, status post whole-brain there is therapy. Electronically Signed   By: Staci Righter M.D.   On: 01/12/2019 15:50    ASSESSMENT AND PLAN: This is a very pleasant 53 years old white male with stage IV non-small cell lung cancer, high-grade neuroendocrine carcinoma presented with locally advanced disease in the chest as well as solitary brain metastasis and 2 liver lesions. The patient is status post stereotactic radiotherapy to the solitary brain metastasis as well as short course of concurrent chemoradiation to the locally advanced disease in the chest. He recently completed 6 cycles of systemic chemotherapy with carboplatin, Alimta and Avastin and tolerated this treatment fairly well. He was started on maintenance treatment with Alimta and Avastin status post 15 cycles.  Avastin was discontinued secondary to intolerance to the combination treatment.  He is currently on treatment with single agent Alimta status post 5 cycles. He has been tolerating his treatment well. Unfortunately he  was recently found to have multiple metastatic brain lesion and the patient underwent whole brain irradiation. His scan showed evidence for disease progression with enlargement of left lower lobe lung nodule in addition to hilar lymphadenopathy as well as progression of the liver lesion. He was started on treatment with nivolumab 480 mg IV every 4 weeks status post 3 cycles.  He has been tolerating this treatment well with no concerning adverse effects. I recommended for him to proceed with cycle #4 of his treatment with nivolumab. I will see him back for follow-up visit in 4 weeks for evaluation before the next cycle of his treatment. He was advised to call immediately if he has any concerning symptoms in the interval. The patient voices understanding of current disease status and treatment options and is in agreement with the current care plan. All  questions were answered. The patient knows to call the clinic with any problems, questions or concerns. We can certainly see the patient much sooner if necessary.  Disclaimer: This note was dictated with voice recognition software. Similar sounding words can inadvertently be transcribed and may not be corrected upon review.

## 2019-01-19 NOTE — Patient Instructions (Signed)
[facility] Discharge Instructions for Patients Receiving Chemotherapy  Today you received the following chemotherapy agents Nivolumab.  To help prevent nausea and vomiting after your treatment, we encourage you to take your nausea medication.   If you develop nausea and vomiting that is not controlled by your nausea medication, call the clinic.   BELOW ARE SYMPTOMS THAT SHOULD BE REPORTED IMMEDIATELY:  *FEVER GREATER THAN 100.5 F  *CHILLS WITH OR WITHOUT FEVER  NAUSEA AND VOMITING THAT IS NOT CONTROLLED WITH YOUR NAUSEA MEDICATION  *UNUSUAL SHORTNESS OF BREATH  *UNUSUAL BRUISING OR BLEEDING  TENDERNESS IN MOUTH AND THROAT WITH OR WITHOUT PRESENCE OF ULCERS  *URINARY PROBLEMS  *BOWEL PROBLEMS  UNUSUAL RASH Items with * indicate a potential emergency and should be followed up as soon as possible.  Feel free to call the clinic should you have any questions or concerns. The clinic phone number is [phone].  Please show the [facility] at check-in to the Emergency Department and triage nurse.

## 2019-01-25 ENCOUNTER — Other Ambulatory Visit: Payer: Self-pay | Admitting: Internal Medicine

## 2019-01-25 DIAGNOSIS — R109 Unspecified abdominal pain: Secondary | ICD-10-CM | POA: Diagnosis not present

## 2019-01-25 DIAGNOSIS — I2609 Other pulmonary embolism with acute cor pulmonale: Secondary | ICD-10-CM

## 2019-01-25 DIAGNOSIS — I2782 Chronic pulmonary embolism: Principal | ICD-10-CM

## 2019-02-09 ENCOUNTER — Other Ambulatory Visit: Payer: Self-pay | Admitting: Radiation Oncology

## 2019-02-09 DIAGNOSIS — R112 Nausea with vomiting, unspecified: Secondary | ICD-10-CM

## 2019-02-09 DIAGNOSIS — C3492 Malignant neoplasm of unspecified part of left bronchus or lung: Secondary | ICD-10-CM

## 2019-02-16 ENCOUNTER — Other Ambulatory Visit: Payer: Self-pay

## 2019-02-16 ENCOUNTER — Inpatient Hospital Stay: Payer: 59

## 2019-02-16 ENCOUNTER — Encounter: Payer: Self-pay | Admitting: Internal Medicine

## 2019-02-16 ENCOUNTER — Inpatient Hospital Stay: Payer: 59 | Attending: Internal Medicine | Admitting: Internal Medicine

## 2019-02-16 DIAGNOSIS — C7A8 Other malignant neuroendocrine tumors: Secondary | ICD-10-CM

## 2019-02-16 DIAGNOSIS — Z5112 Encounter for antineoplastic immunotherapy: Secondary | ICD-10-CM | POA: Insufficient documentation

## 2019-02-16 DIAGNOSIS — C7A1 Malignant poorly differentiated neuroendocrine tumors: Secondary | ICD-10-CM | POA: Insufficient documentation

## 2019-02-16 DIAGNOSIS — C7931 Secondary malignant neoplasm of brain: Secondary | ICD-10-CM

## 2019-02-16 DIAGNOSIS — I10 Essential (primary) hypertension: Secondary | ICD-10-CM | POA: Diagnosis not present

## 2019-02-16 DIAGNOSIS — C3492 Malignant neoplasm of unspecified part of left bronchus or lung: Secondary | ICD-10-CM

## 2019-02-16 DIAGNOSIS — C7B8 Other secondary neuroendocrine tumors: Secondary | ICD-10-CM

## 2019-02-16 DIAGNOSIS — Z7901 Long term (current) use of anticoagulants: Secondary | ICD-10-CM | POA: Diagnosis not present

## 2019-02-16 DIAGNOSIS — Z5111 Encounter for antineoplastic chemotherapy: Secondary | ICD-10-CM

## 2019-02-16 DIAGNOSIS — R05 Cough: Secondary | ICD-10-CM | POA: Diagnosis not present

## 2019-02-16 DIAGNOSIS — Z79899 Other long term (current) drug therapy: Secondary | ICD-10-CM

## 2019-02-16 DIAGNOSIS — C3490 Malignant neoplasm of unspecified part of unspecified bronchus or lung: Secondary | ICD-10-CM

## 2019-02-16 LAB — CBC WITH DIFFERENTIAL (CANCER CENTER ONLY)
Abs Immature Granulocytes: 0.01 10*3/uL (ref 0.00–0.07)
Basophils Absolute: 0 10*3/uL (ref 0.0–0.1)
Basophils Relative: 1 %
Eosinophils Absolute: 0.1 10*3/uL (ref 0.0–0.5)
Eosinophils Relative: 2 %
HCT: 45.2 % (ref 39.0–52.0)
Hemoglobin: 15.3 g/dL (ref 13.0–17.0)
Immature Granulocytes: 0 %
Lymphocytes Relative: 17 %
Lymphs Abs: 0.9 10*3/uL (ref 0.7–4.0)
MCH: 33 pg (ref 26.0–34.0)
MCHC: 33.8 g/dL (ref 30.0–36.0)
MCV: 97.4 fL (ref 80.0–100.0)
Monocytes Absolute: 0.4 10*3/uL (ref 0.1–1.0)
Monocytes Relative: 7 %
Neutro Abs: 3.8 10*3/uL (ref 1.7–7.7)
Neutrophils Relative %: 73 %
Platelet Count: 195 10*3/uL (ref 150–400)
RBC: 4.64 MIL/uL (ref 4.22–5.81)
RDW: 13.3 % (ref 11.5–15.5)
WBC Count: 5.3 10*3/uL (ref 4.0–10.5)
nRBC: 0 % (ref 0.0–0.2)

## 2019-02-16 LAB — CMP (CANCER CENTER ONLY)
ALT: 19 U/L (ref 0–44)
AST: 12 U/L — ABNORMAL LOW (ref 15–41)
Albumin: 3.4 g/dL — ABNORMAL LOW (ref 3.5–5.0)
Alkaline Phosphatase: 93 U/L (ref 38–126)
Anion gap: 8 (ref 5–15)
BUN: 10 mg/dL (ref 6–20)
CO2: 26 mmol/L (ref 22–32)
Calcium: 9 mg/dL (ref 8.9–10.3)
Chloride: 105 mmol/L (ref 98–111)
Creatinine: 1.3 mg/dL — ABNORMAL HIGH (ref 0.61–1.24)
GFR, Est AFR Am: >60 mL/min
GFR, Estimated: >60 mL/min
Glucose, Bld: 135 mg/dL — ABNORMAL HIGH (ref 70–99)
Potassium: 3.7 mmol/L (ref 3.5–5.1)
Sodium: 139 mmol/L (ref 135–145)
Total Bilirubin: 0.5 mg/dL (ref 0.3–1.2)
Total Protein: 6.7 g/dL (ref 6.5–8.1)

## 2019-02-16 LAB — TSH: TSH: <0.08 u[IU]/mL — ABNORMAL LOW (ref 0.320–4.118)

## 2019-02-16 MED ORDER — SODIUM CHLORIDE 0.9 % IV SOLN
Freq: Once | INTRAVENOUS | Status: AC
  Administered 2019-02-16: 13:00:00 via INTRAVENOUS
  Filled 2019-02-16: qty 250

## 2019-02-16 MED ORDER — SODIUM CHLORIDE 0.9 % IV SOLN
480.0000 mg | Freq: Once | INTRAVENOUS | Status: AC
  Administered 2019-02-16: 14:00:00 480 mg via INTRAVENOUS
  Filled 2019-02-16: qty 48

## 2019-02-16 MED ORDER — HYDROCOD POLST-CPM POLST ER 10-8 MG/5ML PO SUER: 5.0000 mL | Freq: Two times a day (BID) | ORAL | 0 refills | Status: DC | PRN

## 2019-02-16 NOTE — Progress Notes (Signed)
Per Dr. Julien Nordmann, ok to treat with HR 119.

## 2019-02-16 NOTE — Patient Instructions (Signed)
[facility] Discharge Instructions for Patients Receiving Chemotherapy  Today you received the following chemotherapy agents Nivolumab.  To help prevent nausea and vomiting after your treatment, we encourage you to take your nausea medication.   If you develop nausea and vomiting that is not controlled by your nausea medication, call the clinic.   BELOW ARE SYMPTOMS THAT SHOULD BE REPORTED IMMEDIATELY:  *FEVER GREATER THAN 100.5 F  *CHILLS WITH OR WITHOUT FEVER  NAUSEA AND VOMITING THAT IS NOT CONTROLLED WITH YOUR NAUSEA MEDICATION  *UNUSUAL SHORTNESS OF BREATH  *UNUSUAL BRUISING OR BLEEDING  TENDERNESS IN MOUTH AND THROAT WITH OR WITHOUT PRESENCE OF ULCERS  *URINARY PROBLEMS  *BOWEL PROBLEMS  UNUSUAL RASH Items with * indicate a potential emergency and should be followed up as soon as possible.  Feel free to call the clinic should you have any questions or concerns. The clinic phone number is [phone].  Please show the [facility] at check-in to the Emergency Department and triage nurse.

## 2019-02-16 NOTE — Progress Notes (Signed)
[facility] [phone]   Fax:([phone]) [phone]  OFFICE PROGRESS NOTE  [name], MD 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  PRIOR THERAPY:  1) Stereotactic radiotherapy to a solitary brain metastasis under the care of Dr. Lisbeth Renshaw on 01/25/2017. 2) Short course of concurrent chemoradiation with weekly carboplatin for AUC of 2 and paclitaxel 45 MG/M2 to the locally advanced disease in the chest. Status post 3 cycles. 3) Systemic chemotherapy with carboplatin for AUC of 5, Alimta 500 MG/M2 and Avastin 15 MG/KG every 3 weeks. First dose of 02/15/2017. Status post 6 cycles. Last dose 06/08/2017 with stable disease. 4) whole brain irradiation under the care of Dr. Lisbeth Renshaw expected to complete on October 14, 2018. 5) Maintenance systemic chemotherapy with Alimta 500 MG/M2 in addition to Avastin 15 MG/KG every 3 weeks. First dose 07/13/2017.  Status post 20 cycles.  Starting from cycle #16 he will be treated with single agent Alimta only secondary to intolerance.   CURRENT THERAPY: Second line treatment with immunotherapy with nivolumab 480 mg IV every 4 weeks.  First dose October 26, 2018.  Status post 4 cycles.  INTERVAL HISTORY: Calvin Wagner 53 y.o. male returns to the clinic today for follow-up visit.  The patient is feeling fine today with no concerning complaints except for the abdomen epigastric pain.  He was seen by his gastroenterologist Dr. Penelope Coop and was supposed to have upper endoscopy but because of the COVID19 Protonix this has been delayed.  The patient denied having any chest pain, shortness of breath, but has mild cough with no  hemoptysis.  He denied having any fever or chills.  He has intermittent nausea with no vomiting, diarrhea or constipation.  He denied having any significant weight loss.  He is here today for evaluation before starting cycle #5 of his immunotherapy.  MEDICAL HISTORY: Past Medical History:  Diagnosis Date  . Encounter for antineoplastic chemotherapy 12/31/2016  . GERD 11/08/2009   Qualifier: Diagnosis of  By: Ronnald Ramp MD, Arvid Right Goals of care, counseling/discussion 12/31/2016  . History of radiation therapy 01/13/2017 - 02/02/2017   Site/dose:  Left lung: 30 Gy in 15 fractions  . HYPERTENSION 11/08/2009   Qualifier: Diagnosis of  By: Ronnald Ramp MD, Arvid Right.   . Large cell carcinoma of left lung, stage 4 ([facility]) 12/31/2016  . Migraine 02/25/2012  . Pneumonia   . PONV (postoperative nausea and vomiting)   . Rotator cuff tear, left     ALLERGIES:  has No Known Allergies.  MEDICATIONS:  Current Outpatient Medications  Medication Sig Dispense Refill  . albuterol (PROVENTIL HFA;VENTOLIN HFA) 108 (90 Base) MCG/ACT inhaler Inhale 2 puffs into the lungs every 4 (four) hours as needed.    Marland Kitchen amLODipine (NORVASC) 5 MG tablet Take 5 mg by mouth daily.    . budesonide (RA BUDESONIDE) 32 MCG/ACT nasal spray Place into both nostrils daily.    . BUDESONIDE PO Take by mouth. Budesonide slurry    . budesonide-formoterol (SYMBICORT) 80-4.5 MCG/ACT inhaler Inhale 2 puffs into the lungs 2 (two) times daily. 1 Inhaler 5  . chlorpheniramine-HYDROcodone (TUSSIONEX) 10-8 MG/5ML SUER Take 5 mLs by mouth every 12 (twelve) hours as needed  for cough. (Patient not taking: Reported on 12/22/2018) 473 mL 0  . famotidine (PEPCID) 20 MG tablet Take 20 mg by mouth 2 (two) times daily.    . folic acid (FOLVITE) 1 MG tablet Take 1 tablet (1 mg total) by mouth daily. (Patient not taking: Reported on 12/22/2018) 30 tablet 4  . furosemide (LASIX) 20 MG tablet Take 1 tablet (20 mg total) by mouth daily as needed. 20 tablet 0  .  LORazepam (ATIVAN) 1 MG tablet Take 1-1.5 tablets (1-1.5 mg total) by mouth every 4 (four) hours as needed for anxiety (30 minutes prior to radiotherapy or MRI scans, and nausea). 120 tablet 0  . losartan (COZAAR) 50 MG tablet Take 50 mg by mouth daily.    . magic mouthwash SOLN Take 5 mLs by mouth 4 (four) times daily as needed for mouth pain. 1:1:1 mix-Nystatin. Benadryl and extra strength maalox. (Patient not taking: Reported on 12/22/2018) 240 mL 0  . Multiple Vitamin (MULTIVITAMIN WITH MINERALS) TABS tablet Take 1 tablet by mouth daily.    . ondansetron (ZOFRAN) 8 MG tablet TAKE 1 TABLET BY MOUTH EVERY 8 HOURS AS NEEDED FOR NAUSEA OR VOMITING. 90 tablet 0  . pantoprazole (PROTONIX) 40 MG tablet Take 40 mg by mouth daily.  2  . PROAIR HFA 108 (90 Base) MCG/ACT inhaler Inhale 2 puffs into the lungs every 4 (four) hours as needed.  5  . prochlorperazine (COMPAZINE) 10 MG tablet TAKE 1 TABLET BY MOUTH EVERY 6 HOURS AS NEEDED FOR NAUSEA OR VOMITING 90 tablet 0  . traMADol (ULTRAM) 50 MG tablet TAKE 1 TABLET BY MOUTH EVERY 12 HOURS AS NEEDED (Patient not taking: Reported on 12/22/2018) 30 tablet 0  . XARELTO 20 MG TABS tablet TAKE 1 TABLET BY MOUTH EVERY DAY WITH SUPPER 90 tablet 0   No current facility-administered medications for this visit.     SURGICAL HISTORY:  Past Surgical History:  Procedure Laterality Date  . INGUINAL HERNIA REPAIR    . SHOULDER ARTHROSCOPY WITH ROTATOR CUFF REPAIR Left 11/12/2016   Procedure: SHOULDER ARTHROSCOPY WITH ROTATOR CUFF REPAIR AND SUBACROMIAL DECOMPRESSION;  Surgeon: Tania Ade, MD;  Location: [facility];  Service: Orthopedics;  Laterality: Left;  SHOULDER ARTHROSCOPY WITH ROTATOR CUFF REPAIR AND SUBACROMIAL DECOMPRESSION  . TONSILLECTOMY    . TOTAL KNEE ARTHROPLASTY    . VIDEO BRONCHOSCOPY Bilateral 12/10/2016   Procedure: VIDEO BRONCHOSCOPY WITHOUT FLUORO;  Surgeon: Tanda Rockers, MD;  Location: [facility];  Service: Cardiopulmonary;  Laterality: Bilateral;     REVIEW OF SYSTEMS:  A comprehensive review of systems was negative except for: Gastrointestinal: positive for abdominal pain and nausea   PHYSICAL EXAMINATION: General appearance: alert, cooperative, fatigued and no distress Head: Normocephalic, without obvious abnormality, atraumatic Neck: no adenopathy, no JVD, supple, symmetrical, trachea midline and thyroid not enlarged, symmetric, no tenderness/mass/nodules Lymph nodes: Cervical, supraclavicular, and axillary nodes normal. Resp: clear to auscultation bilaterally Back: symmetric, no curvature. ROM normal. No CVA tenderness. Cardio: regular rate and rhythm, S1, S2 normal, no murmur, click, rub or gallop GI: soft, non-tender; bowel sounds normal; no masses,  no organomegaly Extremities: extremities normal, atraumatic, no cyanosis or edema  ECOG PERFORMANCE STATUS: 1 - Symptomatic but completely ambulatory  Blood pressure 128/90, pulse (!) 119, temperature 99.1 F (37.3 C), temperature source Oral, resp. rate 18, height 6\' 1"  (1.854 m), weight 230 lb 4.8 oz (104.5 kg), SpO2 100 %.  LABORATORY DATA: Lab Results  Component Value Date   WBC 3.9 (L) 01/19/2019  HGB 13.9 01/19/2019   HCT 41.0 01/19/2019   MCV 98.1 01/19/2019   PLT 176 01/19/2019      Chemistry      Component Value Date/Time   NA 139 01/19/2019 0956   NA 138 10/08/2017 0907   K 3.5 01/19/2019 0956   K 4.1 10/08/2017 0907   CL 106 01/19/2019 0956   CO2 22 01/19/2019 0956   CO2 24 10/08/2017 0907   BUN 8 01/19/2019 0956   BUN 11.7 10/08/2017 0907   CREATININE 1.33 (H) 01/19/2019 0956   CREATININE 1.2 10/08/2017 0907      Component Value Date/Time   CALCIUM 9.3 01/19/2019 0956   CALCIUM 9.5 10/08/2017 0907   ALKPHOS 81 01/19/2019 0956   ALKPHOS 88 10/08/2017 0907   AST 15 01/19/2019 0956   AST 22 10/08/2017 0907   ALT 21 01/19/2019 0956   ALT 24 10/08/2017 0907   BILITOT 0.5 01/19/2019 0956   BILITOT 0.42 10/08/2017 0907       RADIOGRAPHIC  STUDIES: No results found.  ASSESSMENT AND PLAN: This is a very pleasant 53 years old white male with stage IV non-small cell lung cancer, high-grade neuroendocrine carcinoma presented with locally advanced disease in the chest as well as solitary brain metastasis and 2 liver lesions. The patient is status post stereotactic radiotherapy to the solitary brain metastasis as well as short course of concurrent chemoradiation to the locally advanced disease in the chest. He recently completed 6 cycles of systemic chemotherapy with carboplatin, Alimta and Avastin and tolerated this treatment fairly well. He was started on maintenance treatment with Alimta and Avastin status post 15 cycles.  Avastin was discontinued secondary to intolerance to the combination treatment.  He is currently on treatment with single agent Alimta status post 5 cycles. He has been tolerating his treatment well. Unfortunately he was recently found to have multiple metastatic brain lesion and the patient underwent whole brain irradiation. His scan showed evidence for disease progression with enlargement of left lower lobe lung nodule in addition to hilar lymphadenopathy as well as progression of the liver lesion. He was started on treatment with nivolumab 480 mg IV every 4 weeks status post 4 cycles.  He continues to tolerate this treatment well with no concerning adverse effects. I recommended for him to proceed with cycle #5 today as scheduled. I will see him back for follow-up visit in 4 weeks for evaluation after repeating CT scan of the chest, abdomen and pelvis for restaging of his disease. For the cough, I will give the patient a refill for Tussionex. He was advised to call immediately if he has any concerning symptoms in the interval. The patient voices understanding of current disease status and treatment options and is in agreement with the current care plan. All questions were answered. The patient knows to call the  clinic with any problems, questions or concerns. We can certainly see the patient much sooner if necessary.  Disclaimer: This note was dictated with voice recognition software. Similar sounding words can inadvertently be transcribed and may not be corrected upon review.

## 2019-02-17 ENCOUNTER — Telehealth: Payer: Self-pay | Admitting: Internal Medicine

## 2019-02-17 NOTE — Telephone Encounter (Signed)
Added additional cycle per 5/07 los - pt to get an updated schedule next visit.

## 2019-02-23 ENCOUNTER — Other Ambulatory Visit: Payer: Self-pay | Admitting: Radiation Therapy

## 2019-02-23 DIAGNOSIS — C7931 Secondary malignant neoplasm of brain: Secondary | ICD-10-CM

## 2019-02-28 ENCOUNTER — Telehealth: Payer: Self-pay | Admitting: Radiation Therapy

## 2019-02-28 NOTE — Telephone Encounter (Signed)
Spoke with Calvin Wagner about Calvin Wagner's July Brain MRI and Follow-up with Calvin Wagner. She has this information and was thankful for the call.   Calvin Wagner R.T.(R)(T) Special Procedures Navigator

## 2019-03-15 ENCOUNTER — Ambulatory Visit (HOSPITAL_COMMUNITY)
Admission: RE | Admit: 2019-03-15 | Discharge: 2019-03-15 | Disposition: A | Discharge status: Home / Self Care | Payer: 59 | Source: Ambulatory Visit | Attending: Internal Medicine | Admitting: Internal Medicine

## 2019-03-15 ENCOUNTER — Other Ambulatory Visit: Payer: Self-pay

## 2019-03-15 DIAGNOSIS — C3490 Malignant neoplasm of unspecified part of unspecified bronchus or lung: Secondary | ICD-10-CM | POA: Diagnosis not present

## 2019-03-15 MED ORDER — SODIUM CHLORIDE (PF) 0.9 % IJ SOLN
INTRAMUSCULAR | Status: AC
  Filled 2019-03-15: qty 50

## 2019-03-15 MED ORDER — IOHEXOL 300 MG/ML  SOLN
100.0000 mL | Freq: Once | INTRAMUSCULAR | Status: AC | PRN
  Administered 2019-03-15: 100 mL via INTRAVENOUS

## 2019-03-16 ENCOUNTER — Inpatient Hospital Stay: Payer: 59 | Attending: Internal Medicine

## 2019-03-16 ENCOUNTER — Encounter: Payer: Self-pay | Admitting: Internal Medicine

## 2019-03-16 ENCOUNTER — Other Ambulatory Visit: Payer: Self-pay

## 2019-03-16 ENCOUNTER — Inpatient Hospital Stay: Payer: 59

## 2019-03-16 ENCOUNTER — Inpatient Hospital Stay (HOSPITAL_BASED_OUTPATIENT_CLINIC_OR_DEPARTMENT_OTHER): Payer: 59 | Admitting: Internal Medicine

## 2019-03-16 VITALS — BP 127/95 | HR 105

## 2019-03-16 VITALS — BP 130/93 | HR 115 | Temp 97.2°F | Resp 20 | Ht 73.0 in | Wt 234.2 lb

## 2019-03-16 DIAGNOSIS — Z7901 Long term (current) use of anticoagulants: Secondary | ICD-10-CM

## 2019-03-16 DIAGNOSIS — Z7951 Long term (current) use of inhaled steroids: Secondary | ICD-10-CM | POA: Diagnosis not present

## 2019-03-16 DIAGNOSIS — I10 Essential (primary) hypertension: Secondary | ICD-10-CM | POA: Insufficient documentation

## 2019-03-16 DIAGNOSIS — I700 Atherosclerosis of aorta: Secondary | ICD-10-CM

## 2019-03-16 DIAGNOSIS — Z5112 Encounter for antineoplastic immunotherapy: Secondary | ICD-10-CM

## 2019-03-16 DIAGNOSIS — R05 Cough: Secondary | ICD-10-CM | POA: Insufficient documentation

## 2019-03-16 DIAGNOSIS — C787 Secondary malignant neoplasm of liver and intrahepatic bile duct: Secondary | ICD-10-CM | POA: Insufficient documentation

## 2019-03-16 DIAGNOSIS — R110 Nausea: Secondary | ICD-10-CM | POA: Diagnosis not present

## 2019-03-16 DIAGNOSIS — Z9221 Personal history of antineoplastic chemotherapy: Secondary | ICD-10-CM | POA: Insufficient documentation

## 2019-03-16 DIAGNOSIS — C3432 Malignant neoplasm of lower lobe, left bronchus or lung: Secondary | ICD-10-CM

## 2019-03-16 DIAGNOSIS — C3492 Malignant neoplasm of unspecified part of left bronchus or lung: Secondary | ICD-10-CM

## 2019-03-16 DIAGNOSIS — C7931 Secondary malignant neoplasm of brain: Secondary | ICD-10-CM | POA: Insufficient documentation

## 2019-03-16 DIAGNOSIS — C7A1 Malignant poorly differentiated neuroendocrine tumors: Secondary | ICD-10-CM | POA: Diagnosis present

## 2019-03-16 DIAGNOSIS — R5383 Other fatigue: Secondary | ICD-10-CM | POA: Diagnosis not present

## 2019-03-16 DIAGNOSIS — Z79899 Other long term (current) drug therapy: Secondary | ICD-10-CM | POA: Insufficient documentation

## 2019-03-16 LAB — CMP (CANCER CENTER ONLY)
ALT: 15 U/L (ref 0–44)
AST: 14 U/L — ABNORMAL LOW (ref 15–41)
Albumin: 3.5 g/dL (ref 3.5–5.0)
Alkaline Phosphatase: 97 U/L (ref 38–126)
Anion gap: 10 (ref 5–15)
BUN: 9 mg/dL (ref 6–20)
CO2: 24 mmol/L (ref 22–32)
Calcium: 9.1 mg/dL (ref 8.9–10.3)
Chloride: 103 mmol/L (ref 98–111)
Creatinine: 1.33 mg/dL — ABNORMAL HIGH (ref 0.61–1.24)
GFR, Est AFR Am: >60 mL/min (ref >60)
GFR, Estimated: >60 mL/min (ref >60)
Glucose, Bld: 163 mg/dL — ABNORMAL HIGH (ref 70–99)
Potassium: 3.9 mmol/L (ref 3.5–5.1)
Sodium: 137 mmol/L (ref 135–145)
Total Bilirubin: 0.8 mg/dL (ref 0.3–1.2)
Total Protein: 6.7 g/dL (ref 6.5–8.1)

## 2019-03-16 LAB — TSH: TSH: 1.406 u[IU]/mL (ref 0.320–4.118)

## 2019-03-16 LAB — CBC WITH DIFFERENTIAL (CANCER CENTER ONLY)
Abs Immature Granulocytes: 0.01 10*3/uL (ref 0.00–0.07)
Basophils Absolute: 0 10*3/uL (ref 0.0–0.1)
Basophils Relative: 1 %
Eosinophils Absolute: 0.2 10*3/uL (ref 0.0–0.5)
Eosinophils Relative: 3 %
HCT: 47 % (ref 39.0–52.0)
Hemoglobin: 16 g/dL (ref 13.0–17.0)
Immature Granulocytes: 0 %
Lymphocytes Relative: 20 %
Lymphs Abs: 0.9 10*3/uL (ref 0.7–4.0)
MCH: 33.6 pg (ref 26.0–34.0)
MCHC: 34 g/dL (ref 30.0–36.0)
MCV: 98.7 fL (ref 80.0–100.0)
Monocytes Absolute: 0.4 10*3/uL (ref 0.1–1.0)
Monocytes Relative: 10 %
Neutro Abs: 3.1 10*3/uL (ref 1.7–7.7)
Neutrophils Relative %: 66 %
Platelet Count: 157 10*3/uL (ref 150–400)
RBC: 4.76 MIL/uL (ref 4.22–5.81)
RDW: 15.1 % (ref 11.5–15.5)
WBC Count: 4.6 10*3/uL (ref 4.0–10.5)
nRBC: 0 % (ref 0.0–0.2)

## 2019-03-16 MED ORDER — SODIUM CHLORIDE 0.9 % IV SOLN
Freq: Once | INTRAVENOUS | Status: AC
  Administered 2019-03-16: 11:00:00 via INTRAVENOUS
  Filled 2019-03-16: qty 250

## 2019-03-16 MED ORDER — SODIUM CHLORIDE 0.9 % IV SOLN
480.0000 mg | Freq: Once | INTRAVENOUS | Status: AC
  Administered 2019-03-16: 480 mg via INTRAVENOUS
  Filled 2019-03-16: qty 48

## 2019-03-16 NOTE — Progress Notes (Signed)
[facility] [phone]) [phone]   [phone]  OFFICE PROGRESS NOTE  [name], MD 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  PRIOR THERAPY:  1) Stereotactic radiotherapy to a solitary brain metastasis under the care of Dr. Lisbeth Renshaw on 01/25/2017. 2) Short course of concurrent chemoradiation with weekly carboplatin for AUC of 2 and paclitaxel 45 MG/M2 to the locally advanced disease in the chest. Status post 3 cycles. 3) Systemic chemotherapy with carboplatin for AUC of 5, Alimta 500 MG/M2 and Avastin 15 MG/KG every 3 weeks. First dose of 02/15/2017. Status post 6 cycles. Last dose 06/08/2017 with stable disease. 4) whole brain irradiation under the care of Dr. Lisbeth Renshaw expected to complete on October 14, 2018. 5) Maintenance systemic chemotherapy with Alimta 500 MG/M2 in addition to Avastin 15 MG/KG every 3 weeks. First dose 07/13/2017.  Status post 20 cycles.  Starting from cycle #16 he will be treated with single agent Alimta only secondary to intolerance.   CURRENT THERAPY: Second line treatment with immunotherapy with nivolumab 480 mg IV every 4 weeks.  First dose October 26, 2018.  Status post 5 cycles.  INTERVAL HISTORY: Calvin Wagner 53 y.o. male returns to the clinic today for follow-up visit.  The patient is feeling fine today with no concerning complaints except for mild fatigue and occasional nausea.  He denied having any chest pain, shortness of breath, cough or hemoptysis.  He has no weight loss or night sweats.  He denied having any nausea, vomiting, diarrhea or constipation.  He denied having any skin rash.  He has no headache or  visual changes.  He continues to tolerate his treatment with nivolumab fairly well.  The patient had a repeat CT scan of the chest, abdomen pelvis performed recently and he is here for evaluation and discussion of his scan results.   MEDICAL HISTORY: Past Medical History:  Diagnosis Date   Encounter for antineoplastic chemotherapy 12/31/2016   GERD 11/08/2009   Qualifier: Diagnosis of  By: Ronnald Ramp MD, Arvid Right.    Goals of care, counseling/discussion 12/31/2016   History of radiation therapy 01/13/2017 - 02/02/2017   Site/dose:  Left lung: 30 Gy in 15 fractions   HYPERTENSION 11/08/2009   Qualifier: Diagnosis of  By: Ronnald Ramp MD, Arvid Right.    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  MEDICATIONS:  Current Outpatient Medications  Medication Sig Dispense Refill   albuterol (PROVENTIL HFA;VENTOLIN HFA) 108 (90 Base) MCG/ACT inhaler Inhale 2 puffs into the lungs every 4 (four) hours as needed.     amLODipine (NORVASC) 5 MG tablet Take 5 mg by mouth daily.     budesonide (RA BUDESONIDE) 32 MCG/ACT nasal spray Place into both nostrils daily.     BUDESONIDE PO Take by mouth. Budesonide slurry     budesonide-formoterol (SYMBICORT) 80-4.5 MCG/ACT inhaler Inhale 2 puffs into the lungs 2 (two) times daily. 1 Inhaler 5   chlorpheniramine-HYDROcodone (TUSSIONEX) 10-8 MG/5ML SUER Take 5 mLs by mouth every 12 (twelve) hours  as needed for cough. 473 mL 0   famotidine (PEPCID) 20 MG tablet Take 20 mg by mouth 2 (two) times daily.     folic acid (FOLVITE) 1 MG tablet Take 1 tablet (1 mg total) by mouth daily. (Patient not taking: Reported on 12/22/2018) 30 tablet 4   furosemide (LASIX) 20 MG tablet Take 1 tablet (20 mg total) by mouth daily as needed. 20 tablet 0   LORazepam (ATIVAN) 1 MG tablet Take 1-1.5 tablets (1-1.5 mg total)  by mouth every 4 (four) hours as needed for anxiety (30 minutes prior to radiotherapy or MRI scans, and nausea). 120 tablet 0   losartan (COZAAR) 50 MG tablet Take 50 mg by mouth daily.     magic mouthwash SOLN Take 5 mLs by mouth 4 (four) times daily as needed for mouth pain. 1:1:1 mix-Nystatin. Benadryl and extra strength maalox. (Patient not taking: Reported on 12/22/2018) 240 mL 0   Multiple Vitamin (MULTIVITAMIN WITH MINERALS) TABS tablet Take 1 tablet by mouth daily.     ondansetron (ZOFRAN) 8 MG tablet TAKE 1 TABLET BY MOUTH EVERY 8 HOURS AS NEEDED FOR NAUSEA OR VOMITING. 90 tablet 0   pantoprazole (PROTONIX) 40 MG tablet Take 40 mg by mouth daily.  2   PROAIR HFA 108 (90 Base) MCG/ACT inhaler Inhale 2 puffs into the lungs every 4 (four) hours as needed.  5   prochlorperazine (COMPAZINE) 10 MG tablet TAKE 1 TABLET BY MOUTH EVERY 6 HOURS AS NEEDED FOR NAUSEA OR VOMITING 90 tablet 0   traMADol (ULTRAM) 50 MG tablet TAKE 1 TABLET BY MOUTH EVERY 12 HOURS AS NEEDED (Patient not taking: Reported on 12/22/2018) 30 tablet 0   XARELTO 20 MG TABS tablet TAKE 1 TABLET BY MOUTH EVERY DAY WITH SUPPER 90 tablet 0   No current facility-administered medications for this visit.     SURGICAL HISTORY:  Past Surgical History:  Procedure Laterality Date   INGUINAL HERNIA REPAIR     SHOULDER ARTHROSCOPY WITH ROTATOR CUFF REPAIR Left 11/12/2016   Procedure: SHOULDER ARTHROSCOPY WITH ROTATOR CUFF REPAIR AND SUBACROMIAL DECOMPRESSION;  Surgeon: Tania Ade, MD;  Location: [facility];  Service: Orthopedics;  Laterality: Left;  SHOULDER ARTHROSCOPY WITH ROTATOR CUFF REPAIR AND SUBACROMIAL DECOMPRESSION   TONSILLECTOMY     TOTAL KNEE ARTHROPLASTY     VIDEO BRONCHOSCOPY Bilateral 12/10/2016   Procedure: VIDEO BRONCHOSCOPY WITHOUT FLUORO;  Surgeon: Tanda Rockers, MD;  Location: [facility];  Service: Cardiopulmonary;  Laterality: Bilateral;    REVIEW OF SYSTEMS:  Constitutional: positive for  fatigue Eyes: negative Ears, nose, mouth, throat, and face: negative Respiratory: negative Cardiovascular: negative Gastrointestinal: positive for nausea Genitourinary:negative Integument/breast: negative Hematologic/lymphatic: negative Musculoskeletal:negative Neurological: negative Behavioral/Psych: negative Endocrine: negative Allergic/Immunologic: negative   PHYSICAL EXAMINATION: General appearance: alert, cooperative, fatigued and no distress Head: Normocephalic, without obvious abnormality, atraumatic Neck: no adenopathy, no JVD, supple, symmetrical, trachea midline and thyroid not enlarged, symmetric, no tenderness/mass/nodules Lymph nodes: Cervical, supraclavicular, and axillary nodes normal. Resp: clear to auscultation bilaterally Back: symmetric, no curvature. ROM normal. No CVA tenderness. Cardio: regular rate and rhythm, S1, S2 normal, no murmur, click, rub or gallop GI: soft, non-tender; bowel sounds normal; no masses,  no organomegaly Extremities: extremities normal, atraumatic, no cyanosis or edema Neurologic: Alert and oriented X 3, normal strength and tone. Normal symmetric reflexes. Normal coordination and gait  ECOG PERFORMANCE STATUS: 1 - Symptomatic but completely ambulatory  Blood pressure (!) 130/93, pulse (!) 115, temperature (!) 97.2 F (36.2 C), temperature source  Oral, resp. rate 20, height 6\' 1"  (1.854 m), weight 234 lb 3.2 oz (106.2 kg), SpO2 100 %.  LABORATORY DATA: Lab Results  Component Value Date   WBC 4.6 03/16/2019   HGB 16.0 03/16/2019   HCT 47.0 03/16/2019   MCV 98.7 03/16/2019   PLT 157 03/16/2019      Chemistry      Component Value Date/Time   NA 139 02/16/2019 1141   NA 138 10/08/2017 0907   K 3.7 02/16/2019 1141   K 4.1 10/08/2017 0907   CL 105 02/16/2019 1141   CO2 26 02/16/2019 1141   CO2 24 10/08/2017 0907   BUN 10 02/16/2019 1141   BUN 11.7 10/08/2017 0907   CREATININE 1.30 (H) 02/16/2019 1141   CREATININE 1.2  10/08/2017 0907      Component Value Date/Time   CALCIUM 9.0 02/16/2019 1141   CALCIUM 9.5 10/08/2017 0907   ALKPHOS 93 02/16/2019 1141   ALKPHOS 88 10/08/2017 0907   AST 12 (L) 02/16/2019 1141   AST 22 10/08/2017 0907   ALT 19 02/16/2019 1141   ALT 24 10/08/2017 0907   BILITOT 0.5 02/16/2019 1141   BILITOT 0.42 10/08/2017 0907       RADIOGRAPHIC STUDIES: Ct Chest W Contrast  Result Date: 03/15/2019 CLINICAL DATA:  Metastatic lung cancer diagnosed in March of 2018. Status post chemotherapy. Immunotherapy in progress. EXAM: CT CHEST, ABDOMEN, AND PELVIS WITH CONTRAST TECHNIQUE: Multidetector CT imaging of the chest, abdomen and pelvis was performed following the standard protocol during bolus administration of intravenous contrast. CONTRAST:  136mL OMNIPAQUE IOHEXOL 300 MG/ML  SOLN COMPARISON:  12/19/2018 FINDINGS: CT CHEST FINDINGS Cardiovascular: Normal caliber of the aorta and branch vessels. Normal heart size, without pericardial effusion. No central pulmonary embolism, on this non-dedicated study. Mediastinum/Nodes: No supraclavicular adenopathy. No mediastinal or hilar adenopathy. No residual left hilar adenopathy. Lungs/Pleura: Minimal left pleural thickening is similar. Left lower lobe nodule is essentially resolved with only linear scarring remaining on image 116/6. Left perihilar interstitial thickening is likely radiation induced. 2 mm left lower lobe subpleural pulmonary nodule on image 83/6 is not significantly changed. Musculoskeletal: No acute osseous abnormality. CT ABDOMEN PELVIS FINDINGS Hepatobiliary: Focal steatosis adjacent the falciform ligament. The subcapsular segment 5 lesion measures on the order of 1.5 x 1.3 cm on image 68/2. Compare 2.1 x 1.7 cm on the prior. Normal gallbladder, without biliary ductal dilatation. Pancreas: Normal, without mass or ductal dilatation. Spleen: Normal in size, without focal abnormality. Adrenals/Urinary Tract: Normal adrenal glands. Normal  kidneys, without hydronephrosis. Normal urinary bladder. Stomach/Bowel: Normal stomach, without wall thickening. Normal colon, appendix, and terminal ileum. Normal small bowel. Vascular/Lymphatic: Aortic atherosclerosis. No abdominopelvic adenopathy. Reproductive: Normal prostate. Other: No significant free fluid. Prior pelvic ventral wall hernia repair. No evidence of omental or peritoneal disease. Tiny fat containing periumbilical hernia. Musculoskeletal: Degenerate disc disease at the lumbosacral junction. IMPRESSION: 1. Response to therapy with resolution of left lower lobe pulmonary nodule and left-sided hilar adenopathy. 2. Decrease in size of right hepatic lobe hypoattenuating lesion. 3. No new or progressive disease. 4.  Aortic Atherosclerosis (ICD10-I70.0). Electronically Signed   By: Abigail Miyamoto M.D.   On: 03/15/2019 16:42   Ct Abdomen Pelvis W Contrast  Result Date: 03/15/2019 CLINICAL DATA:  Metastatic lung cancer diagnosed in March of 2018. Status post chemotherapy. Immunotherapy in progress. EXAM: CT CHEST, ABDOMEN, AND PELVIS WITH CONTRAST TECHNIQUE: Multidetector CT imaging of the chest, abdomen and pelvis was performed following the standard protocol during bolus  administration of intravenous contrast. CONTRAST:  125mL OMNIPAQUE IOHEXOL 300 MG/ML  SOLN COMPARISON:  12/19/2018 FINDINGS: CT CHEST FINDINGS Cardiovascular: Normal caliber of the aorta and branch vessels. Normal heart size, without pericardial effusion. No central pulmonary embolism, on this non-dedicated study. Mediastinum/Nodes: No supraclavicular adenopathy. No mediastinal or hilar adenopathy. No residual left hilar adenopathy. Lungs/Pleura: Minimal left pleural thickening is similar. Left lower lobe nodule is essentially resolved with only linear scarring remaining on image 116/6. Left perihilar interstitial thickening is likely radiation induced. 2 mm left lower lobe subpleural pulmonary nodule on image 83/6 is not significantly  changed. Musculoskeletal: No acute osseous abnormality. CT ABDOMEN PELVIS FINDINGS Hepatobiliary: Focal steatosis adjacent the falciform ligament. The subcapsular segment 5 lesion measures on the order of 1.5 x 1.3 cm on image 68/2. Compare 2.1 x 1.7 cm on the prior. Normal gallbladder, without biliary ductal dilatation. Pancreas: Normal, without mass or ductal dilatation. Spleen: Normal in size, without focal abnormality. Adrenals/Urinary Tract: Normal adrenal glands. Normal kidneys, without hydronephrosis. Normal urinary bladder. Stomach/Bowel: Normal stomach, without wall thickening. Normal colon, appendix, and terminal ileum. Normal small bowel. Vascular/Lymphatic: Aortic atherosclerosis. No abdominopelvic adenopathy. Reproductive: Normal prostate. Other: No significant free fluid. Prior pelvic ventral wall hernia repair. No evidence of omental or peritoneal disease. Tiny fat containing periumbilical hernia. Musculoskeletal: Degenerate disc disease at the lumbosacral junction. IMPRESSION: 1. Response to therapy with resolution of left lower lobe pulmonary nodule and left-sided hilar adenopathy. 2. Decrease in size of right hepatic lobe hypoattenuating lesion. 3. No new or progressive disease. 4.  Aortic Atherosclerosis (ICD10-I70.0). Electronically Signed   By: Abigail Miyamoto M.D.   On: 03/15/2019 16:42    ASSESSMENT AND PLAN: This is a very pleasant 53 years old white male with stage IV non-small cell lung cancer, high-grade neuroendocrine carcinoma presented with locally advanced disease in the chest as well as solitary brain metastasis and 2 liver lesions. The patient is status post stereotactic radiotherapy to the solitary brain metastasis as well as short course of concurrent chemoradiation to the locally advanced disease in the chest. He recently completed 6 cycles of systemic chemotherapy with carboplatin, Alimta and Avastin and tolerated this treatment fairly well. He was started on maintenance  treatment with Alimta and Avastin status post 15 cycles.  Avastin was discontinued secondary to intolerance to the combination treatment.  He is currently on treatment with single agent Alimta status post 5 cycles. He has been tolerating his treatment well. Unfortunately he was recently found to have multiple metastatic brain lesion and the patient underwent whole brain irradiation. His scan showed evidence for disease progression with enlargement of left lower lobe lung nodule in addition to hilar lymphadenopathy as well as progression of the liver lesion. He was started on treatment with nivolumab 480 mg IV every 4 weeks status post 5 cycles.  The patient has been tolerating this treatment well with no concerning adverse effects. Repeat CT scan of the chest, abdomen pelvis performed recently showed further improvement of his disease. I discussed the scan results with the patient today and recommended for him to continue 4 weeks and he will proceed with cycle #6 today. For the cough, he will continue on Tussionex. The patient will come back for follow-up visit in 4 weeks for evaluation before starting cycle #7. He was advised to call immediately if he has any concerning symptoms in the interval. The patient voices understanding of current disease status and treatment options and is in agreement with the current care plan. All  questions were answered. The patient knows to call the clinic with any problems, questions or concerns. We can certainly see the patient much sooner if necessary.  Disclaimer: This note was dictated with voice recognition software. Similar sounding words can inadvertently be transcribed and may not be corrected upon review.

## 2019-03-16 NOTE — Patient Instructions (Signed)
[facility] Discharge Instructions for Patients Receiving Chemotherapy  Today you received the following chemotherapy agents Nivolumab (OPDIVO).  To help prevent nausea and vomiting after your treatment, we encourage you to take your nausea medication as prescribed.  If you develop nausea and vomiting that is not controlled by your nausea medication, call the clinic.   BELOW ARE SYMPTOMS THAT SHOULD BE REPORTED IMMEDIATELY:  *FEVER GREATER THAN 100.5 F  *CHILLS WITH OR WITHOUT FEVER  NAUSEA AND VOMITING THAT IS NOT CONTROLLED WITH YOUR NAUSEA MEDICATION  *UNUSUAL SHORTNESS OF BREATH  *UNUSUAL BRUISING OR BLEEDING  TENDERNESS IN MOUTH AND THROAT WITH OR WITHOUT PRESENCE OF ULCERS  *URINARY PROBLEMS  *BOWEL PROBLEMS  UNUSUAL RASH Items with * indicate a potential emergency and should be followed up as soon as possible.  Feel free to call the clinic should you have any questions or concerns. The clinic phone number is [phone].  Please show the [facility] at check-in to the Emergency Department and triage nurse.  Coronavirus (COVID-19) Are you at risk?  Are you at risk for the Coronavirus (COVID-19)?  To be considered HIGH RISK for Coronavirus (COVID-19), you have to meet the following criteria:  . Traveled to [place], [place], [place], [place] or [place]; or in the [place] to [place], [place], [place], or [place]; and have fever, cough, and shortness of breath within the last 2 weeks of travel OR . Been in close contact with a person diagnosed with COVID-19 within the last 2 weeks and have fever, cough, and shortness of breath . IF YOU DO NOT MEET THESE CRITERIA, YOU ARE CONSIDERED LOW RISK FOR COVID-19.  What to do if you are HIGH RISK for COVID-19?  Marland Kitchen If you are having a medical emergency, call 911. . Seek medical care right away. Before you go to a doctor's office, urgent care or emergency department, call ahead and tell them about  your recent travel, contact with someone diagnosed with COVID-19, and your symptoms. You should receive instructions from your physician's office regarding next steps of care.  . When you arrive at healthcare provider, tell the healthcare staff immediately you have returned from visiting [place], [place], [place], [place] or [place]; or traveled in the [place] to [place], [place], [place], or [place]; in the last two weeks or you have been in close contact with a person diagnosed with COVID-19 in the last 2 weeks.   . Tell the health care staff about your symptoms: fever, cough and shortness of breath. . After you have been seen by a medical provider, you will be either: o Tested for (COVID-19) and discharged home on quarantine except to seek medical care if symptoms worsen, and asked to  - Stay home and avoid contact with others until you get your results (4-5 days)  - Avoid travel on public transportation if possible (such as bus, train, or airplane) or o Sent to the Emergency Department by EMS for evaluation, COVID-19 testing, and possible admission depending on your condition and test results.  What to do if you are LOW RISK for COVID-19?  Reduce your risk of any infection by using the same precautions used for avoiding the common cold or flu:  Marland Kitchen Wash your hands often with soap and warm water for at least 20 seconds.  If soap and water are not readily available, use an alcohol-based hand sanitizer with at least 60% alcohol.  . If coughing or sneezing,  cover your mouth and nose by coughing or sneezing into the elbow areas of your shirt or coat, into a tissue or into your sleeve (not your hands). . Avoid shaking hands with others and consider head nods or verbal greetings only. . Avoid touching your eyes, nose, or mouth with unwashed hands.  . Avoid close contact with people who are sick. . Avoid places or events with large numbers of people in one location, like concerts or sporting  events. . Carefully consider travel plans you have or are making. . If you are planning any travel outside or inside the [place], visit the CDC's Travelers' Health webpage for the latest health notices. . If you have some symptoms but not all symptoms, continue to monitor at home and seek medical attention if your symptoms worsen. . If you are having a medical emergency, call 911.   [facility] / e-Visit: [email or web address]         [facility]: [facility]: [phone]                   [facility]: [phone]

## 2019-03-16 NOTE — Progress Notes (Signed)
Per Dr Julien Nordmann it is okay to treat pt today with nivolumab and heart rate of 105.

## 2019-03-19 ENCOUNTER — Other Ambulatory Visit: Payer: Self-pay | Admitting: Radiation Oncology

## 2019-03-19 DIAGNOSIS — C3492 Malignant neoplasm of unspecified part of left bronchus or lung: Secondary | ICD-10-CM

## 2019-03-19 DIAGNOSIS — R112 Nausea with vomiting, unspecified: Secondary | ICD-10-CM

## 2019-03-30 ENCOUNTER — Telehealth: Payer: Self-pay | Admitting: Medical Oncology

## 2019-03-30 NOTE — Telephone Encounter (Signed)
Message sent to [facility] and Demetrius Charity to see if NIVO will be covered if it is given a June 29th -afew days earlier. He is on a 4 week cycle.

## 2019-03-30 NOTE — Telephone Encounter (Signed)
Ok but there could be insurance concerns about giving him treatment earlier.

## 2019-03-30 NOTE — Telephone Encounter (Signed)
Request Change treatment date to earlier from July 2 to  June 29th so he will feel good to go on vacation July 4-11th

## 2019-03-31 ENCOUNTER — Telehealth: Payer: Self-pay | Admitting: Physician Assistant

## 2019-03-31 NOTE — Telephone Encounter (Signed)
Per 6/19 schedule message moved 7/2 appointments to 6/29 and called wife re change. Left message. Schedule mailed.

## 2019-03-31 NOTE — Telephone Encounter (Signed)
Per Lanora Manis nivo authorized for every 2 weeks and pharmacy said clinically 3 days early should be fine. Schedule message sent. Wife notified.

## 2019-04-10 ENCOUNTER — Other Ambulatory Visit: Payer: Self-pay

## 2019-04-10 ENCOUNTER — Inpatient Hospital Stay (HOSPITAL_BASED_OUTPATIENT_CLINIC_OR_DEPARTMENT_OTHER): Payer: 59 | Admitting: Physician Assistant

## 2019-04-10 ENCOUNTER — Inpatient Hospital Stay: Payer: 59

## 2019-04-10 VITALS — BP 128/91 | HR 104 | Temp 99.8°F | Resp 18 | Ht 73.0 in | Wt 243.5 lb

## 2019-04-10 DIAGNOSIS — I10 Essential (primary) hypertension: Secondary | ICD-10-CM

## 2019-04-10 DIAGNOSIS — C3492 Malignant neoplasm of unspecified part of left bronchus or lung: Secondary | ICD-10-CM

## 2019-04-10 DIAGNOSIS — C7B8 Other secondary neuroendocrine tumors: Secondary | ICD-10-CM | POA: Diagnosis not present

## 2019-04-10 DIAGNOSIS — R5383 Other fatigue: Secondary | ICD-10-CM

## 2019-04-10 DIAGNOSIS — Z5112 Encounter for antineoplastic immunotherapy: Secondary | ICD-10-CM

## 2019-04-10 DIAGNOSIS — R12 Heartburn: Secondary | ICD-10-CM

## 2019-04-10 DIAGNOSIS — C7A1 Malignant poorly differentiated neuroendocrine tumors: Secondary | ICD-10-CM | POA: Diagnosis not present

## 2019-04-10 DIAGNOSIS — R0602 Shortness of breath: Secondary | ICD-10-CM | POA: Diagnosis not present

## 2019-04-10 LAB — CBC WITH DIFFERENTIAL (CANCER CENTER ONLY)
Abs Immature Granulocytes: 0.01 10*3/uL (ref 0.00–0.07)
Basophils Absolute: 0 10*3/uL (ref 0.0–0.1)
Basophils Relative: 0 %
Eosinophils Absolute: 0.1 10*3/uL (ref 0.0–0.5)
Eosinophils Relative: 3 %
HCT: 47.2 % (ref 39.0–52.0)
Hemoglobin: 15.9 g/dL (ref 13.0–17.0)
Immature Granulocytes: 0 %
Lymphocytes Relative: 19 %
Lymphs Abs: 0.9 10*3/uL (ref 0.7–4.0)
MCH: 33.8 pg (ref 26.0–34.0)
MCHC: 33.7 g/dL (ref 30.0–36.0)
MCV: 100.4 fL — ABNORMAL HIGH (ref 80.0–100.0)
Monocytes Absolute: 0.4 10*3/uL (ref 0.1–1.0)
Monocytes Relative: 8 %
Neutro Abs: 3.2 10*3/uL (ref 1.7–7.7)
Neutrophils Relative %: 70 %
Platelet Count: 171 10*3/uL (ref 150–400)
RBC: 4.7 MIL/uL (ref 4.22–5.81)
RDW: 14.6 % (ref 11.5–15.5)
WBC Count: 4.6 10*3/uL (ref 4.0–10.5)
nRBC: 0 % (ref 0.0–0.2)

## 2019-04-10 LAB — CMP (CANCER CENTER ONLY)
ALT: 20 U/L (ref 0–44)
AST: 19 U/L (ref 15–41)
Albumin: 3.7 g/dL (ref 3.5–5.0)
Alkaline Phosphatase: 101 U/L (ref 38–126)
Anion gap: 10 (ref 5–15)
BUN: 14 mg/dL (ref 6–20)
CO2: 25 mmol/L (ref 22–32)
Calcium: 9.1 mg/dL (ref 8.9–10.3)
Chloride: 104 mmol/L (ref 98–111)
Creatinine: 1.34 mg/dL — ABNORMAL HIGH (ref 0.61–1.24)
GFR, Est AFR Am: >60 mL/min (ref >60)
GFR, Estimated: >60 mL/min (ref >60)
Glucose, Bld: 129 mg/dL — ABNORMAL HIGH (ref 70–99)
Potassium: 3.9 mmol/L (ref 3.5–5.1)
Sodium: 139 mmol/L (ref 135–145)
Total Bilirubin: 0.5 mg/dL (ref 0.3–1.2)
Total Protein: 7.2 g/dL (ref 6.5–8.1)

## 2019-04-10 LAB — TSH: TSH: 2.658 u[IU]/mL (ref 0.320–4.118)

## 2019-04-10 MED ORDER — SODIUM CHLORIDE 0.9 % IV SOLN
480.0000 mg | Freq: Once | INTRAVENOUS | Status: AC
  Administered 2019-04-10: 480 mg via INTRAVENOUS
  Filled 2019-04-10: qty 48

## 2019-04-10 MED ORDER — SODIUM CHLORIDE 0.9 % IV SOLN
Freq: Once | INTRAVENOUS | Status: AC
  Administered 2019-04-10: 10:00:00 via INTRAVENOUS
  Filled 2019-04-10: qty 250

## 2019-04-10 NOTE — Progress Notes (Signed)
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  PRIOR THERAPY:  1) Stereotactic radiotherapy to a solitary brain metastasis under the care of Dr. Lisbeth Renshaw on 01/25/2017. 2) Short course of concurrent chemoradiation with weekly carboplatin for AUC of 2 and paclitaxel 45 MG/M2 to the locally advanced disease in the chest. Status post 3 cycles. 3) Systemic chemotherapy with carboplatin for AUC of 5, Alimta 500 MG/M2 and Avastin 15 MG/KG every 3 weeks. First dose of 02/15/2017. Status post 6 cycles. Last dose 06/08/2017 with stable disease. 4) whole brain irradiation under the care of Dr. Lisbeth Renshaw expected to complete on October 14, 2018. 5) Maintenance systemic chemotherapy with Alimta 500 MG/M2 in addition to Avastin 15 MG/KG every 3 weeks. First dose 07/13/2017.  Status post 20 cycles.  Starting from cycle #16 he will be treated with single agent Alimta only secondary to intolerance.  CURRENT THERAPY:  Second line treatment with immunotherapy with nivolumab 480 mg IV every 4 weeks.  First dose October 26, 2018.  Status post 6 cycles.  INTERVAL HISTORY: Calvin Wagner 53 y.o. male returns to the clinic for a follow-up visit.  The patient is feeling well today without any concerning complaints except for continued fatigue and heart burn. He takes protonix and pepcid for his symptoms with some improvement. The patient otherwise continues to tolerate his treatment with immunotherapy well without any adverse effects. He is planning on traveling to the beach this week for the holiday.  He denies any fever, chills, night sweats, or  weight loss.  He denies any chest pain or hemoptysis.  He continues to experience baseline shortness of breath and cough for which he uses Tussionex as well as Symbicort and proair.  He denies any headache or visual changes.  He has a repeat brain MRI scheduled in a few weeks for his history of brain metastasis. He denies any rashes or skin changes.  He denies any nausea, vomiting, diarrhea, or constipation.  He is here today for evaluation before starting cycle #7.  MEDICAL HISTORY: Past Medical History:  Diagnosis Date  . Encounter for antineoplastic chemotherapy 12/31/2016  . GERD 11/08/2009   Qualifier: Diagnosis of  By: Ronnald Ramp MD, Arvid Right Goals of care, counseling/discussion 12/31/2016  . History of radiation therapy 01/13/2017 - 02/02/2017   Site/dose:  Left lung: 30 Gy in 15 fractions  . HYPERTENSION 11/08/2009   Qualifier: Diagnosis of  By: Ronnald Ramp MD, Arvid Right.   . 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 Migraine 02/25/2012  . Pneumonia   . PONV (postoperative nausea and vomiting)   . Rotator cuff tear, left     ALLERGIES:  has No Known Allergies.  MEDICATIONS:  Current Outpatient Medications  Medication Sig Dispense Refill  . albuterol (PROVENTIL HFA;VENTOLIN HFA) 108 (90 Base) MCG/ACT inhaler Inhale 2 puffs into the lungs every 4 (four) hours as needed.    Marland Kitchen amLODipine (NORVASC) 5 MG tablet Take 5 mg by mouth daily.    . budesonide (RA BUDESONIDE) 32 MCG/ACT nasal spray Place into both nostrils daily.    . BUDESONIDE PO Take by mouth. Budesonide slurry    .  budesonide-formoterol (SYMBICORT) 80-4.5 MCG/ACT inhaler Inhale 2 puffs into the lungs 2 (two) times daily. 1 Inhaler 5  . chlorpheniramine-HYDROcodone (TUSSIONEX) 10-8 MG/5ML SUER Take 5 mLs by mouth every 12 (twelve) hours as needed for cough. 473 mL 0  . famotidine (PEPCID) 20 MG tablet Take 20 mg by mouth 2 (two) times daily.    . folic acid (FOLVITE) 1 MG tablet Take 1 tablet (1 mg total) by  mouth daily. 30 tablet 4  . furosemide (LASIX) 20 MG tablet Take 1 tablet (20 mg total) by mouth daily as needed. 20 tablet 0  . LORazepam (ATIVAN) 1 MG tablet Take 1-1.5 tablets (1-1.5 mg total) by mouth every 4 (four) hours as needed for anxiety (30 minutes prior to radiotherapy or MRI scans, and nausea). 120 tablet 0  . losartan (COZAAR) 50 MG tablet Take 50 mg by mouth daily.    . magic mouthwash SOLN Take 5 mLs by mouth 4 (four) times daily as needed for mouth pain. 1:1:1 mix-Nystatin. Benadryl and extra strength maalox. 240 mL 0  . Multiple Vitamin (MULTIVITAMIN WITH MINERALS) TABS tablet Take 1 tablet by mouth daily.    . ondansetron (ZOFRAN) 8 MG tablet TAKE 1 TABLET BY MOUTH EVERY 8 HOURS AS NEEDED FOR NAUSEA OR VOMITING. 90 tablet 0  . pantoprazole (PROTONIX) 40 MG tablet Take 40 mg by mouth daily.  2  . PROAIR HFA 108 (90 Base) MCG/ACT inhaler Inhale 2 puffs into the lungs every 4 (four) hours as needed.  5  . prochlorperazine (COMPAZINE) 10 MG tablet TAKE 1 TABLET BY MOUTH EVERY 6 HOURS AS NEEDED FOR NAUSEA OR VOMITING 90 tablet 0  . traMADol (ULTRAM) 50 MG tablet TAKE 1 TABLET BY MOUTH EVERY 12 HOURS AS NEEDED 30 tablet 0  . XARELTO 20 MG TABS tablet TAKE 1 TABLET BY MOUTH EVERY DAY WITH SUPPER 90 tablet 0   No current facility-administered medications for this visit.     SURGICAL HISTORY:  Past Surgical History:  Procedure Laterality Date  . INGUINAL HERNIA REPAIR    . SHOULDER ARTHROSCOPY WITH ROTATOR CUFF REPAIR Left 11/12/2016   Procedure: SHOULDER ARTHROSCOPY WITH ROTATOR CUFF REPAIR AND SUBACROMIAL DECOMPRESSION;  Surgeon: Tania Ade, MD;  Location: [facility];  Service: Orthopedics;  Laterality: Left;  SHOULDER ARTHROSCOPY WITH ROTATOR CUFF REPAIR AND SUBACROMIAL DECOMPRESSION  . TONSILLECTOMY    . TOTAL KNEE ARTHROPLASTY    . VIDEO BRONCHOSCOPY Bilateral 12/10/2016   Procedure: VIDEO BRONCHOSCOPY WITHOUT FLUORO;  Surgeon: Tanda Rockers, MD;  Location: [facility];   Service: Cardiopulmonary;  Laterality: Bilateral;    REVIEW OF SYSTEMS:   Review of Systems  Constitutional: Positive for fatigue. Negative for appetite change, chills, fever and unexpected weight change.  HENT:  Negative for mouth sores, nosebleeds, sore throat and trouble swallowing.  Eyes: Negative for eye problems and icterus.  Respiratory: Positive for baseline cough and shortness of breath. Negative for  hemoptysis and wheezing.   Cardiovascular: Negative for chest pain and leg swelling.  Gastrointestinal: Positive for heart burn. Negative for abdominal pain, constipation, diarrhea, nausea and vomiting.  Genitourinary: Negative for bladder incontinence, difficulty urinating, dysuria, frequency and hematuria.   Musculoskeletal: Negative for back pain, gait problem, neck pain and neck stiffness.  Skin: Negative for itching and rash.  Neurological: Negative for dizziness, extremity weakness, gait problem, headaches, light-headedness and seizures.  Hematological: Negative for adenopathy. Does not bruise/bleed easily.  Psychiatric/Behavioral: Negative for confusion, depression and sleep disturbance. The patient is not nervous/anxious.  PHYSICAL EXAMINATION:  There were no vitals taken for this visit.  ECOG PERFORMANCE STATUS: 1 - Symptomatic but completely ambulatory  Physical Exam  Constitutional: Oriented to person, place, and time and well-developed, well-nourished, and in no distress.  HENT:  Head: Normocephalic and atraumatic.  Mouth/Throat: Oropharynx is clear and moist. No oropharyngeal exudate.  Eyes: Conjunctivae are normal. Right eye exhibits no discharge. Left eye exhibits no discharge. No scleral icterus.  Neck: Normal range of motion. Neck supple.  Cardiovascular: Normal rate, regular rhythm, normal heart sounds and intact distal pulses.   Pulmonary/Chest: Effort normal and breath sounds normal. No respiratory distress. No wheezes. No rales.  Abdominal: Soft. Bowel  sounds are normal. Exhibits no distension and no mass. There is no tenderness.  Musculoskeletal: Normal range of motion. Exhibits no edema.  Lymphadenopathy:    No cervical adenopathy.  Neurological: Alert and oriented to person, place, and time. Exhibits normal muscle tone. Gait normal. Coordination normal.  Skin: Skin is warm and dry. No rash noted. Not diaphoretic. No erythema. No pallor.  Psychiatric: Mood, memory and judgment normal.  Vitals reviewed.  LABORATORY DATA: Lab Results  Component Value Date   WBC 4.6 04/10/2019   HGB 15.9 04/10/2019   HCT 47.2 04/10/2019   MCV 100.4 (H) 04/10/2019   PLT 171 04/10/2019      Chemistry      Component Value Date/Time   NA 137 03/16/2019 0939   NA 138 10/08/2017 0907   K 3.9 03/16/2019 0939   K 4.1 10/08/2017 0907   CL 103 03/16/2019 0939   CO2 24 03/16/2019 0939   CO2 24 10/08/2017 0907   BUN 9 03/16/2019 0939   BUN 11.7 10/08/2017 0907   CREATININE 1.33 (H) 03/16/2019 0939   CREATININE 1.2 10/08/2017 0907      Component Value Date/Time   CALCIUM 9.1 03/16/2019 0939   CALCIUM 9.5 10/08/2017 0907   ALKPHOS 97 03/16/2019 0939   ALKPHOS 88 10/08/2017 0907   AST 14 (L) 03/16/2019 0939   AST 22 10/08/2017 0907   ALT 15 03/16/2019 0939   ALT 24 10/08/2017 0907   BILITOT 0.8 03/16/2019 0939   BILITOT 0.42 10/08/2017 0907       RADIOGRAPHIC STUDIES:  Ct Chest W Contrast  Result Date: 03/15/2019 CLINICAL DATA:  Metastatic lung cancer diagnosed in March of 2018. Status post chemotherapy. Immunotherapy in progress. EXAM: CT CHEST, ABDOMEN, AND PELVIS WITH CONTRAST TECHNIQUE: Multidetector CT imaging of the chest, abdomen and pelvis was performed following the standard protocol during bolus administration of intravenous contrast. CONTRAST:  120mL OMNIPAQUE IOHEXOL 300 MG/ML  SOLN COMPARISON:  12/19/2018 FINDINGS: CT CHEST FINDINGS Cardiovascular: Normal caliber of the aorta and branch vessels. Normal heart size, without  pericardial effusion. No central pulmonary embolism, on this non-dedicated study. Mediastinum/Nodes: No supraclavicular adenopathy. No mediastinal or hilar adenopathy. No residual left hilar adenopathy. Lungs/Pleura: Minimal left pleural thickening is similar. Left lower lobe nodule is essentially resolved with only linear scarring remaining on image 116/6. Left perihilar interstitial thickening is likely radiation induced. 2 mm left lower lobe subpleural pulmonary nodule on image 83/6 is not significantly changed. Musculoskeletal: No acute osseous abnormality. CT ABDOMEN PELVIS FINDINGS Hepatobiliary: Focal steatosis adjacent the falciform ligament. The subcapsular segment 5 lesion measures on the order of 1.5 x 1.3 cm on image 68/2. Compare 2.1 x 1.7 cm on the prior. Normal gallbladder, without biliary ductal dilatation. Pancreas: Normal, without mass or ductal dilatation. Spleen: Normal in size, without focal abnormality. Adrenals/Urinary  Tract: Normal adrenal glands. Normal kidneys, without hydronephrosis. Normal urinary bladder. Stomach/Bowel: Normal stomach, without wall thickening. Normal colon, appendix, and terminal ileum. Normal small bowel. Vascular/Lymphatic: Aortic atherosclerosis. No abdominopelvic adenopathy. Reproductive: Normal prostate. Other: No significant free fluid. Prior pelvic ventral wall hernia repair. No evidence of omental or peritoneal disease. Tiny fat containing periumbilical hernia. Musculoskeletal: Degenerate disc disease at the lumbosacral junction. IMPRESSION: 1. Response to therapy with resolution of left lower lobe pulmonary nodule and left-sided hilar adenopathy. 2. Decrease in size of right hepatic lobe hypoattenuating lesion. 3. No new or progressive disease. 4.  Aortic Atherosclerosis (ICD10-I70.0). Electronically Signed   By: Abigail Miyamoto M.D.   On: 03/15/2019 16:42   Ct Abdomen Pelvis W Contrast  Result Date: 03/15/2019 CLINICAL DATA:  Metastatic lung cancer diagnosed in  March of 2018. Status post chemotherapy. Immunotherapy in progress. EXAM: CT CHEST, ABDOMEN, AND PELVIS WITH CONTRAST TECHNIQUE: Multidetector CT imaging of the chest, abdomen and pelvis was performed following the standard protocol during bolus administration of intravenous contrast. CONTRAST:  185mL OMNIPAQUE IOHEXOL 300 MG/ML  SOLN COMPARISON:  12/19/2018 FINDINGS: CT CHEST FINDINGS Cardiovascular: Normal caliber of the aorta and branch vessels. Normal heart size, without pericardial effusion. No central pulmonary embolism, on this non-dedicated study. Mediastinum/Nodes: No supraclavicular adenopathy. No mediastinal or hilar adenopathy. No residual left hilar adenopathy. Lungs/Pleura: Minimal left pleural thickening is similar. Left lower lobe nodule is essentially resolved with only linear scarring remaining on image 116/6. Left perihilar interstitial thickening is likely radiation induced. 2 mm left lower lobe subpleural pulmonary nodule on image 83/6 is not significantly changed. Musculoskeletal: No acute osseous abnormality. CT ABDOMEN PELVIS FINDINGS Hepatobiliary: Focal steatosis adjacent the falciform ligament. The subcapsular segment 5 lesion measures on the order of 1.5 x 1.3 cm on image 68/2. Compare 2.1 x 1.7 cm on the prior. Normal gallbladder, without biliary ductal dilatation. Pancreas: Normal, without mass or ductal dilatation. Spleen: Normal in size, without focal abnormality. Adrenals/Urinary Tract: Normal adrenal glands. Normal kidneys, without hydronephrosis. Normal urinary bladder. Stomach/Bowel: Normal stomach, without wall thickening. Normal colon, appendix, and terminal ileum. Normal small bowel. Vascular/Lymphatic: Aortic atherosclerosis. No abdominopelvic adenopathy. Reproductive: Normal prostate. Other: No significant free fluid. Prior pelvic ventral wall hernia repair. No evidence of omental or peritoneal disease. Tiny fat containing periumbilical hernia. Musculoskeletal: Degenerate  disc disease at the lumbosacral junction. IMPRESSION: 1. Response to therapy with resolution of left lower lobe pulmonary nodule and left-sided hilar adenopathy. 2. Decrease in size of right hepatic lobe hypoattenuating lesion. 3. No new or progressive disease. 4.  Aortic Atherosclerosis (ICD10-I70.0). Electronically Signed   By: Abigail Miyamoto M.D.   On: 03/15/2019 16:42     ASSESSMENT/PLAN:  This is a very pleasant 53 year old Caucasian male with stage IV non-small cell lung cancer, high-grade neuroendocrine carcinoma.  He presented with locally advanced disease in the chest, a solitary brain metastasis, and 2 liver lesions.  The patient was diagnosed in March 2018.  The patient is status post stereotactic radiotherapy to the solitary brain metastasis as well with a short course of concurrent chemoradiation to the locally advanced disease in the chest. ` He then underwent 6 cycles of systemic chemotherapy with carboplatin, Alimta, and Avastin.  He tolerated treatment well.  He was then started on maintenance treatment with Alimta and Avastin.  He is status post 15 cycles.  Avastin was discontinued due to intolerance with combination therapy.  He then completed 5 more cycles of single agent Alimta.  This was discontinued due  to developing multiple metastatic brain lesions and progression of the liver lesion. Then completed whole brain irradiation.  He is currently being treated with nivolumab 480 mg IV every 4 weeks.  He is status post 6 cycles and is tolerating it well without any adverse effects. Labs were reviewed with the patient.  I recommend that he proceed with cycle #7 today as scheduled.  I will see him back for follow-up visit in 4 weeks for evaluation before starting cycle #8.  We will continue to use Tussinex for his cough.   The patient was advised to call immediately if he has any concerning symptoms in the interval. The patient voices understanding of current disease status and  treatment options and is in agreement with the current care plan. All questions were answered. The patient knows to call the clinic with any problems, questions or concerns. We can certainly see the patient much sooner if necessary  No orders of the defined types were placed in this encounter.    Ki Luckman L Jawad Wiacek, PA-C 04/10/19

## 2019-04-10 NOTE — Patient Instructions (Signed)
[facility] Discharge Instructions for Patients Receiving Chemotherapy  Today you received the following chemotherapy agents: Nivolumab (OPDIVO).  To help prevent nausea and vomiting after your treatment, we encourage you to take your nausea medication as prescribed.  If you develop nausea and vomiting that is not controlled by your nausea medication, call the clinic.   BELOW ARE SYMPTOMS THAT SHOULD BE REPORTED IMMEDIATELY:  *FEVER GREATER THAN 100.5 F  *CHILLS WITH OR WITHOUT FEVER  NAUSEA AND VOMITING THAT IS NOT CONTROLLED WITH YOUR NAUSEA MEDICATION  *UNUSUAL SHORTNESS OF BREATH  *UNUSUAL BRUISING OR BLEEDING  TENDERNESS IN MOUTH AND THROAT WITH OR WITHOUT PRESENCE OF ULCERS  *URINARY PROBLEMS  *BOWEL PROBLEMS  UNUSUAL RASH Items with * indicate a potential emergency and should be followed up as soon as possible.  Feel free to call the clinic should you have any questions or concerns. The clinic phone number is [phone].  Please show the [facility] at check-in to the Emergency Department and triage nurse.  Coronavirus (COVID-19) Are you at risk?  Are you at risk for the Coronavirus (COVID-19)?  To be considered HIGH RISK for Coronavirus (COVID-19), you have to meet the following criteria:  . Traveled to [place], [place], [place], [place] or [place]; or in the [place] to [place], [place], [place], or [place]; and have fever, cough, and shortness of breath within the last 2 weeks of travel OR . Been in close contact with a person diagnosed with COVID-19 within the last 2 weeks and have fever, cough, and shortness of breath . IF YOU DO NOT MEET THESE CRITERIA, YOU ARE CONSIDERED LOW RISK FOR COVID-19.  What to do if you are HIGH RISK for COVID-19?  Marland Kitchen If you are having a medical emergency, call 911. . Seek medical care right away. Before you go to a doctor's office, urgent care or emergency department, call ahead and tell them  about your recent travel, contact with someone diagnosed with COVID-19, and your symptoms. You should receive instructions from your physician's office regarding next steps of care.  . When you arrive at healthcare provider, tell the healthcare staff immediately you have returned from visiting [place], [place], [place], [place] or [place]; or traveled in the [place] to [place], [place], [place], or [place]; in the last two weeks or you have been in close contact with a person diagnosed with COVID-19 in the last 2 weeks.   . Tell the health care staff about your symptoms: fever, cough and shortness of breath. . After you have been seen by a medical provider, you will be either: o Tested for (COVID-19) and discharged home on quarantine except to seek medical care if symptoms worsen, and asked to  - Stay home and avoid contact with others until you get your results (4-5 days)  - Avoid travel on public transportation if possible (such as bus, train, or airplane) or o Sent to the Emergency Department by EMS for evaluation, COVID-19 testing, and possible admission depending on your condition and test results.  What to do if you are LOW RISK for COVID-19?  Reduce your risk of any infection by using the same precautions used for avoiding the common cold or flu:  Marland Kitchen Wash your hands often with soap and warm water for at least 20 seconds.  If soap and water are not readily available, use an alcohol-based hand sanitizer with at least 60% alcohol.  . If coughing or sneezing,  cover your mouth and nose by coughing or sneezing into the elbow areas of your shirt or coat, into a tissue or into your sleeve (not your hands). . Avoid shaking hands with others and consider head nods or verbal greetings only. . Avoid touching your eyes, nose, or mouth with unwashed hands.  . Avoid close contact with people who are sick. . Avoid places or events with large numbers of people in one location, like concerts or  sporting events. . Carefully consider travel plans you have or are making. . If you are planning any travel outside or inside the [place], visit the CDC's Travelers' Health webpage for the latest health notices. . If you have some symptoms but not all symptoms, continue to monitor at home and seek medical attention if your symptoms worsen. . If you are having a medical emergency, call 911.   [facility] / e-Visit: [email or web address]         [facility]: [facility]: [phone]                   [facility]: [phone]

## 2019-04-11 ENCOUNTER — Other Ambulatory Visit: Payer: Self-pay | Admitting: Radiation Oncology

## 2019-04-13 ENCOUNTER — Ambulatory Visit: Payer: 59

## 2019-04-13 ENCOUNTER — Ambulatory Visit: Payer: 59 | Admitting: Internal Medicine

## 2019-04-13 ENCOUNTER — Other Ambulatory Visit: Payer: 59

## 2019-04-20 ENCOUNTER — Other Ambulatory Visit: Payer: 59

## 2019-04-24 ENCOUNTER — Ambulatory Visit: Payer: Self-pay | Admitting: Radiation Oncology

## 2019-04-26 ENCOUNTER — Other Ambulatory Visit: Payer: Self-pay | Admitting: Radiation Therapy

## 2019-04-27 ENCOUNTER — Ambulatory Visit
Admission: RE | Admit: 2019-04-27 | Discharge: 2019-04-27 | Disposition: A | Discharge status: Home / Self Care | Payer: 59 | Source: Ambulatory Visit | Attending: Radiation Oncology | Admitting: Radiation Oncology

## 2019-04-27 DIAGNOSIS — C7949 Secondary malignant neoplasm of other parts of nervous system: Secondary | ICD-10-CM

## 2019-04-27 DIAGNOSIS — C7931 Secondary malignant neoplasm of brain: Secondary | ICD-10-CM

## 2019-04-27 MED ORDER — GADOBENATE DIMEGLUMINE 529 MG/ML IV SOLN
20.0000 mL | Freq: Once | INTRAVENOUS | Status: AC | PRN
  Administered 2019-04-27: 20 mL via INTRAVENOUS

## 2019-05-01 ENCOUNTER — Other Ambulatory Visit: Payer: Self-pay

## 2019-05-01 ENCOUNTER — Ambulatory Visit
Admission: RE | Admit: 2019-05-01 | Discharge: 2019-05-01 | Disposition: A | Discharge status: Home / Self Care | Payer: 59 | Source: Ambulatory Visit | Attending: Radiation Oncology | Admitting: Radiation Oncology

## 2019-05-01 DIAGNOSIS — C7931 Secondary malignant neoplasm of brain: Secondary | ICD-10-CM

## 2019-05-01 DIAGNOSIS — C3492 Malignant neoplasm of unspecified part of left bronchus or lung: Secondary | ICD-10-CM

## 2019-05-01 NOTE — Progress Notes (Signed)
Radiation Oncology         [phone] ________________________________  Outpatient Follow Up - Conducted via telephone due to current COVID-19 concerns for limiting patient exposure  I spoke with the patient to conduct this consult visit via telephone to spare the patient unnecessary potential exposure in the healthcare setting during the current COVID-19 pandemic. The patient was notified in advance and was offered a WebEX/Mychart meeting to allow for face to face communication this was attempted but did not launch properly so we agreed to proceed with a telephone visit.  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  Interval Since Last Radiation: 6 months   09/29/2018 - 10/14/2018:  Whole Brain / 30 Gy in 10 fractions  01/13/17 - 02/02/17: Left lung: 30 Gy in 15 fractions  01/25/17 SRS Treatment: PTV1 Left Frontal 55mm: 20 Gy in 1 fraction   Narrative:  Mr. Swayze is a pleasant, 53 y.o. gentleman who was diagnosed with stage IV lung cancer in the spring of 2018. He received SRS to a left frontal lesion and he has responded well to this with complete resolution noted on imaging since 09/2017. He has also received radiotherapy to the lung. He was found to have progression in December 2019, and had multiple lesions in the brain, and received whole brain radiotherapy. Following that, he began receiving Nivolumab in January 2020. He continues on this with Dr. Julien Nordmann. He had a recent MRI of the brain on 04/27/2019 that showed enhancement about the left frontal lesion. 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 It was recommended that we repeat his MRI in 3 months. He was set up for mychart but his end did not launch and we continued with telephone encounter.   On review of systems, he continues to have nausea and reports it happens for about 10 days after his treatment with immunotherapy. He denies any concerns with frequent headaches, visual changes, loss of function, or progressive nausea. He denies any chest pain or shortness of breath. No other complaints are noted.  Past Medical History:  Past Medical History:  Diagnosis Date   Encounter for antineoplastic chemotherapy 12/31/2016   GERD 11/08/2009   Qualifier: Diagnosis of  By: Ronnald Ramp MD, Arvid Right.    Goals of care, counseling/discussion 12/31/2016   History of radiation therapy 01/13/2017 - 02/02/2017   Site/dose:  Left lung: 30 Gy in 15 fractions   HYPERTENSION 11/08/2009   Qualifier: Diagnosis of  By: Ronnald Ramp MD, Arvid Right.    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   Migraine 02/25/2012   Pneumonia    PONV (postoperative nausea and vomiting)    Rotator cuff tear, left     Past Surgical History: Past Surgical History:  Procedure Laterality Date   INGUINAL HERNIA REPAIR     SHOULDER ARTHROSCOPY WITH ROTATOR CUFF REPAIR Left 11/12/2016   Procedure: SHOULDER ARTHROSCOPY WITH ROTATOR CUFF REPAIR AND SUBACROMIAL DECOMPRESSION;  Surgeon: Tania Ade, MD;  Location: [facility];  Service: Orthopedics;  Laterality: Left;  SHOULDER ARTHROSCOPY WITH ROTATOR CUFF REPAIR AND SUBACROMIAL DECOMPRESSION  TONSILLECTOMY     TOTAL KNEE ARTHROPLASTY     VIDEO BRONCHOSCOPY Bilateral 12/10/2016   Procedure: VIDEO BRONCHOSCOPY WITHOUT FLUORO;  Surgeon: Tanda Rockers, MD;  Location: [facility];  Service: Cardiopulmonary;  Laterality: Bilateral;    Social History:  Social History   Socioeconomic History   Marital status: Married    Spouse name: Baker Janus   Number of children:  2   Years of education: Not on file   Highest education level: Not on file  Occupational History   Occupation: Psychologist, occupational: [employer] resource strain: Not on file   Food insecurity    Worry: Not on file    Inability: Not on file   Transportation needs    Medical: No    Non-medical: No  Tobacco Use   Smoking status: Never Smoker   Smokeless tobacco: Never Used  Substance and Sexual Activity   Alcohol use: Yes    Comment: social   Drug use: No   Sexual activity: Not Currently  Lifestyle   Physical activity    Days per week: Not on file    Minutes per session: Not on file   Stress: Not on file  Relationships   Social connections    Talks on phone: Not on file    Gets together: Not on file    Attends religious service: Not on file    Active member of club or organization: Not on file    Attends meetings of clubs or organizations: Not on file    Relationship status: Not on file   Intimate partner violence    Fear of current or ex partner: Not on file    Emotionally abused: Not on file    Physically abused: Not on file    Forced sexual activity: Not on file  Other Topics Concern   Not on file  Social History Narrative   Lives at home wife and 2 kids   Caffeine use: none    The patient is on disability and is no longer working at [employer]. He lives in [place] and is accompanied by his wife on the call.  Family History: Family History  Problem Relation Age of Onset   Heart disease Mother 77   Lung cancer Other        family history   CAD Other        strong family history male and male <50   Mental retardation Other    Heart attack Brother 19   Alcohol abuse Neg Hx    Diabetes Neg Hx    Early death Neg Hx    Hyperlipidemia Neg Hx    Hypertension Neg Hx    Kidney disease Neg Hx    Stroke Neg Hx    Migraines Neg Hx                   ALLERGIES:  has No Known  Allergies.  Meds: Current Outpatient Medications  Medication Sig Dispense Refill   albuterol (PROVENTIL HFA;VENTOLIN HFA) 108 (90 Base) MCG/ACT inhaler Inhale 2 puffs into the lungs every 4 (four) hours as needed.     amLODipine (NORVASC) 5 MG tablet Take 5 mg by mouth daily.     budesonide (RA BUDESONIDE) 32 MCG/ACT nasal spray Place into both nostrils daily.     BUDESONIDE PO Take by mouth. Budesonide slurry     budesonide-formoterol (SYMBICORT) 80-4.5 MCG/ACT inhaler Inhale 2 puffs into  the lungs 2 (two) times daily. 1 Inhaler 5   chlorpheniramine-HYDROcodone (TUSSIONEX) 10-8 MG/5ML SUER Take 5 mLs by mouth every 12 (twelve) hours as needed for cough. 473 mL 0   famotidine (PEPCID) 20 MG tablet Take 20 mg by mouth 2 (two) times daily.     folic acid (FOLVITE) 1 MG tablet Take 1 tablet (1 mg total) by mouth daily. 30 tablet 4   furosemide (LASIX) 20 MG tablet Take 1 tablet (20 mg total) by mouth daily as needed. 20 tablet 0   LORazepam (ATIVAN) 1 MG tablet TAKE 1-1 AND 1/2 TABSEVERY 4 HOURS AS NEEDED FOR ANXIETY (30 MINUTES PRIOR TO RADIOTHERAPY OR MRI SCANS, AND NAUSEA). 90 tablet 1   losartan (COZAAR) 50 MG tablet Take 50 mg by mouth daily.     magic mouthwash SOLN Take 5 mLs by mouth 4 (four) times daily as needed for mouth pain. 1:1:1 mix-Nystatin. Benadryl and extra strength maalox. 240 mL 0   Multiple Vitamin (MULTIVITAMIN WITH MINERALS) TABS tablet Take 1 tablet by mouth daily.     ondansetron (ZOFRAN) 8 MG tablet TAKE 1 TABLET BY MOUTH EVERY 8 HOURS AS NEEDED FOR NAUSEA OR VOMITING. 90 tablet 0   pantoprazole (PROTONIX) 40 MG tablet Take 40 mg by mouth daily.  2   PROAIR HFA 108 (90 Base) MCG/ACT inhaler Inhale 2 puffs into the lungs every 4 (four) hours as needed.  5   prochlorperazine (COMPAZINE) 10 MG tablet TAKE 1 TABLET BY MOUTH EVERY 6 HOURS AS NEEDED FOR NAUSEA OR VOMITING 90 tablet 0   traMADol (ULTRAM) 50 MG tablet TAKE 1 TABLET BY MOUTH EVERY 12 HOURS AS  NEEDED (Patient not taking: Reported on 04/10/2019) 30 tablet 0   XARELTO 20 MG TABS tablet TAKE 1 TABLET BY MOUTH EVERY DAY WITH SUPPER 90 tablet 0   No current facility-administered medications for this encounter.     Physical Findings:  vitals were not taken for this visit.  Unable to assess due to nature of the call  Lab Findings: Lab Results  Component Value Date   WBC 4.6 04/10/2019   HGB 15.9 04/10/2019   HCT 47.2 04/10/2019   MCV 100.4 (H) 04/10/2019   PLT 171 04/10/2019     Radiographic Findings: Mr Jeri Cos CX Contrast  Result Date: 04/28/2019 CLINICAL DATA:  Follow-up multiple brain metastases. EXAM: MRI HEAD WITHOUT AND WITH CONTRAST TECHNIQUE: Multiplanar, multiecho pulse sequences of the brain and surrounding structures were obtained without and with intravenous contrast. CONTRAST:  15mL MULTIHANCE GADOBENATE DIMEGLUMINE 529 MG/ML IV SOLN COMPARISON:  01/12/2019 multiple prior studies as distant as March 2018. FINDINGS: BRAIN New Lesions: 2 adjacent foci of peripheral enhancement with central necrosis in the inferior frontal region on the left, the larger more superior lesion measuring 14 x 8 mm and the smaller more inferior lesion measuring 11 x 5 mm. Regional vasogenic edema. This is the site of the initial treated lesion first seen in a pleural of 2018. Larger lesions: None. Stable or Smaller lesions: All other previous lesions identified in December of 2019 show resolution, without residual enhancement or abnormal FLAIR or T2 signal. Other Brain findings: No evidence of hydrocephalus or extra-axial collection. No evidence of ischemic infarction. Vascular: Major vessels at the base of the brain show flow. Skull and upper cervical spine: Negative Sinuses/Orbits: Ordinary mucosal inflammatory changes of the paranasal sinuses. Other: None IMPRESSION: 1. Two adjacent foci of irregular peripheral enhancement with central nonenhancement and surrounding vasogenic edema in the  inferior frontal lobe on the left, at the site of the initial treated lesion first seen in March of 2018. 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 2.  Other previously treated lesions remain invisible. Electronically Signed   By: Nelson Chimes M.D.   On: 04/28/2019 09:44    Impression/Plan: 1. 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  The patient's scan was reviewed in brain oncology conference and was felt to be amenable to continue to follow in surveillance. He does have some modest change around the left frontal lobe around a previously treated site. We will plan to continue repeat imaging in 3 months. He will continue immunotherapy with Dr. Julien Nordmann as well. 2. Nausea. It is unclear if his symptoms are anticipatory in addition to being related to his brain disease. He is going to try scheduling zofran and using compazine prn, and his ativan prn. New rx's were sent in for each of these as he is running low.  3. Claustrophobia. The patient will also use ativan prn MRI scans. We will follow this expectantly.  Given current concerns for patient exposure during the COVID-19 pandemic, this encounter was conducted via telephone.  The patient has given verbal consent for this type of encounter. The time spent during this encounter was 10 minutes and 50% of that time was spent in the coordination of his care. The attendants for this meeting included  Shona Simpson, [facility] and Elta Guadeloupe and Pryor Curia  During the encounter, Shona Simpson [facility] was located at [facility] Radiation Oncology Department.  Haskel Khan and his wife  were located at home.    Carola Rhine, PAC

## 2019-05-11 ENCOUNTER — Telehealth: Payer: Self-pay | Admitting: Medical Oncology

## 2019-05-11 ENCOUNTER — Other Ambulatory Visit: Payer: Self-pay | Admitting: Internal Medicine

## 2019-05-11 ENCOUNTER — Inpatient Hospital Stay: Payer: Medicare Other | Attending: Internal Medicine

## 2019-05-11 ENCOUNTER — Other Ambulatory Visit: Payer: Self-pay

## 2019-05-11 ENCOUNTER — Encounter: Payer: Self-pay | Admitting: Internal Medicine

## 2019-05-11 ENCOUNTER — Inpatient Hospital Stay: Payer: Medicare Other

## 2019-05-11 ENCOUNTER — Telehealth: Payer: Self-pay | Admitting: Internal Medicine

## 2019-05-11 ENCOUNTER — Inpatient Hospital Stay (HOSPITAL_BASED_OUTPATIENT_CLINIC_OR_DEPARTMENT_OTHER): Payer: Medicare Other | Admitting: Internal Medicine

## 2019-05-11 VITALS — BP 134/89 | HR 111 | Temp 98.2°F | Resp 20 | Ht 73.0 in | Wt 235.2 lb

## 2019-05-11 VITALS — HR 110

## 2019-05-11 DIAGNOSIS — C7931 Secondary malignant neoplasm of brain: Secondary | ICD-10-CM

## 2019-05-11 DIAGNOSIS — Z79899 Other long term (current) drug therapy: Secondary | ICD-10-CM

## 2019-05-11 DIAGNOSIS — Z7901 Long term (current) use of anticoagulants: Secondary | ICD-10-CM | POA: Diagnosis not present

## 2019-05-11 DIAGNOSIS — R590 Localized enlarged lymph nodes: Secondary | ICD-10-CM | POA: Diagnosis not present

## 2019-05-11 DIAGNOSIS — Z5112 Encounter for antineoplastic immunotherapy: Secondary | ICD-10-CM | POA: Insufficient documentation

## 2019-05-11 DIAGNOSIS — C3432 Malignant neoplasm of lower lobe, left bronchus or lung: Secondary | ICD-10-CM

## 2019-05-11 DIAGNOSIS — K1379 Other lesions of oral mucosa: Secondary | ICD-10-CM

## 2019-05-11 DIAGNOSIS — R110 Nausea: Secondary | ICD-10-CM

## 2019-05-11 DIAGNOSIS — Z5111 Encounter for antineoplastic chemotherapy: Secondary | ICD-10-CM

## 2019-05-11 DIAGNOSIS — C3492 Malignant neoplasm of unspecified part of left bronchus or lung: Secondary | ICD-10-CM

## 2019-05-11 DIAGNOSIS — C3490 Malignant neoplasm of unspecified part of unspecified bronchus or lung: Secondary | ICD-10-CM

## 2019-05-11 DIAGNOSIS — C787 Secondary malignant neoplasm of liver and intrahepatic bile duct: Secondary | ICD-10-CM | POA: Insufficient documentation

## 2019-05-11 DIAGNOSIS — R05 Cough: Secondary | ICD-10-CM | POA: Insufficient documentation

## 2019-05-11 DIAGNOSIS — I10 Essential (primary) hypertension: Secondary | ICD-10-CM | POA: Insufficient documentation

## 2019-05-11 LAB — CMP (CANCER CENTER ONLY)
ALT: 21 U/L (ref 0–44)
AST: 17 U/L (ref 15–41)
Albumin: 3.8 g/dL (ref 3.5–5.0)
Alkaline Phosphatase: 105 U/L (ref 38–126)
Anion gap: 11 (ref 5–15)
BUN: 14 mg/dL (ref 6–20)
CO2: 25 mmol/L (ref 22–32)
Calcium: 9.4 mg/dL (ref 8.9–10.3)
Chloride: 104 mmol/L (ref 98–111)
Creatinine: 1.45 mg/dL — ABNORMAL HIGH (ref 0.61–1.24)
GFR, Est AFR Am: >60 mL/min (ref >60)
GFR, Estimated: 55 mL/min — ABNORMAL LOW (ref >60)
Glucose, Bld: 142 mg/dL — ABNORMAL HIGH (ref 70–99)
Potassium: 4.2 mmol/L (ref 3.5–5.1)
Sodium: 140 mmol/L (ref 135–145)
Total Bilirubin: 0.5 mg/dL (ref 0.3–1.2)
Total Protein: 7.4 g/dL (ref 6.5–8.1)

## 2019-05-11 LAB — CBC WITH DIFFERENTIAL (CANCER CENTER ONLY)
Abs Immature Granulocytes: 0 10*3/uL (ref 0.00–0.07)
Basophils Absolute: 0 10*3/uL (ref 0.0–0.1)
Basophils Relative: 1 %
Eosinophils Absolute: 0.1 10*3/uL (ref 0.0–0.5)
Eosinophils Relative: 3 %
HCT: 49.7 % (ref 39.0–52.0)
Hemoglobin: 16.9 g/dL (ref 13.0–17.0)
Immature Granulocytes: 0 %
Lymphocytes Relative: 25 %
Lymphs Abs: 1 10*3/uL (ref 0.7–4.0)
MCH: 34.5 pg — ABNORMAL HIGH (ref 26.0–34.0)
MCHC: 34 g/dL (ref 30.0–36.0)
MCV: 101.4 fL — ABNORMAL HIGH (ref 80.0–100.0)
Monocytes Absolute: 0.3 10*3/uL (ref 0.1–1.0)
Monocytes Relative: 9 %
Neutro Abs: 2.5 10*3/uL (ref 1.7–7.7)
Neutrophils Relative %: 62 %
Platelet Count: 180 10*3/uL (ref 150–400)
RBC: 4.9 MIL/uL (ref 4.22–5.81)
RDW: 13.8 % (ref 11.5–15.5)
WBC Count: 3.9 10*3/uL — ABNORMAL LOW (ref 4.0–10.5)
nRBC: 0 % (ref 0.0–0.2)

## 2019-05-11 LAB — TSH: TSH: 3.961 u[IU]/mL (ref 0.320–4.118)

## 2019-05-11 MED ORDER — HYDROCOD POLST-CPM POLST ER 10-8 MG/5ML PO SUER: 5.0000 mL | Freq: Two times a day (BID) | ORAL | 0 refills | Status: DC | PRN

## 2019-05-11 MED ORDER — MAGIC MOUTHWASH: 5.0000 mL | Freq: Four times a day (QID) | ORAL | 0 refills | Status: DC | PRN

## 2019-05-11 MED ORDER — SODIUM CHLORIDE 0.9 % IV SOLN
480.0000 mg | Freq: Once | INTRAVENOUS | Status: AC
  Administered 2019-05-11: 480 mg via INTRAVENOUS
  Filled 2019-05-11: qty 48

## 2019-05-11 MED ORDER — SODIUM CHLORIDE 0.9 % IV SOLN
Freq: Once | INTRAVENOUS | Status: AC
  Administered 2019-05-11: 10:00:00 via INTRAVENOUS
  Filled 2019-05-11: qty 250

## 2019-05-11 NOTE — Telephone Encounter (Signed)
Scheduled appt per 7/30 los - pt to get an updated schedule next visit.

## 2019-05-11 NOTE — Progress Notes (Signed)
Per Dr Julien Nordmann it is okay to treat pt today with Nivolumab and pulse of 110/min.

## 2019-05-11 NOTE — Patient Instructions (Signed)
[facility] Discharge Instructions for Patients Receiving Chemotherapy  Today you received the following chemotherapy agents: Opdivo   To help prevent nausea and vomiting after your treatment, we encourage you to take your nausea medication as directed.    If you develop nausea and vomiting that is not controlled by your nausea medication, call the clinic.   BELOW ARE SYMPTOMS THAT SHOULD BE REPORTED IMMEDIATELY:  *FEVER GREATER THAN 100.5 F  *CHILLS WITH OR WITHOUT FEVER  NAUSEA AND VOMITING THAT IS NOT CONTROLLED WITH YOUR NAUSEA MEDICATION  *UNUSUAL SHORTNESS OF BREATH  *UNUSUAL BRUISING OR BLEEDING  TENDERNESS IN MOUTH AND THROAT WITH OR WITHOUT PRESENCE OF ULCERS  *URINARY PROBLEMS  *BOWEL PROBLEMS  UNUSUAL RASH Items with * indicate a potential emergency and should be followed up as soon as possible.  Feel free to call the clinic should you have any questions or concerns. The clinic phone number is [phone].  Please show the [facility] at check-in to the Emergency Department and triage nurse.  Coronavirus (COVID-19) Are you at risk?  Are you at risk for the Coronavirus (COVID-19)?  To be considered HIGH RISK for Coronavirus (COVID-19), you have to meet the following criteria:  . Traveled to [place], [place], [place], [place] or [place]; or in the [place] to [place], [place], [place], or [place]; and have fever, cough, and shortness of breath within the last 2 weeks of travel OR . Been in close contact with a person diagnosed with COVID-19 within the last 2 weeks and have fever, cough, and shortness of breath . IF YOU DO NOT MEET THESE CRITERIA, YOU ARE CONSIDERED LOW RISK FOR COVID-19.  What to do if you are HIGH RISK for COVID-19?  Marland Kitchen If you are having a medical emergency, call 911. . Seek medical care right away. Before you go to a doctor's office, urgent care or emergency department, call ahead and tell them about your  recent travel, contact with someone diagnosed with COVID-19, and your symptoms. You should receive instructions from your physician's office regarding next steps of care.  . When you arrive at healthcare provider, tell the healthcare staff immediately you have returned from visiting [place], [place], [place], [place] or [place]; or traveled in the [place] to [place], [place], [place], or [place]; in the last two weeks or you have been in close contact with a person diagnosed with COVID-19 in the last 2 weeks.   . Tell the health care staff about your symptoms: fever, cough and shortness of breath. . After you have been seen by a medical provider, you will be either: o Tested for (COVID-19) and discharged home on quarantine except to seek medical care if symptoms worsen, and asked to  - Stay home and avoid contact with others until you get your results (4-5 days)  - Avoid travel on public transportation if possible (such as bus, train, or airplane) or o Sent to the Emergency Department by EMS for evaluation, COVID-19 testing, and possible admission depending on your condition and test results.  What to do if you are LOW RISK for COVID-19?  Reduce your risk of any infection by using the same precautions used for avoiding the common cold or flu:  Marland Kitchen Wash your hands often with soap and warm water for at least 20 seconds.  If soap and water are not readily available, use an alcohol-based hand sanitizer with at least 60% alcohol.  . If coughing  or sneezing, cover your mouth and nose by coughing or sneezing into the elbow areas of your shirt or coat, into a tissue or into your sleeve (not your hands). . Avoid shaking hands with others and consider head nods or verbal greetings only. . Avoid touching your eyes, nose, or mouth with unwashed hands.  . Avoid close contact with people who are sick. . Avoid places or events with large numbers of people in one location, like concerts or sporting  events. . Carefully consider travel plans you have or are making. . If you are planning any travel outside or inside the [place], visit the CDC's Travelers' Health webpage for the latest health notices. . If you have some symptoms but not all symptoms, continue to monitor at home and seek medical attention if your symptoms worsen. . If you are having a medical emergency, call 911.   [place] / e-Visit: [email or web address]         [facility]: [facility]: [phone]                   [facility]: [phone]

## 2019-05-11 NOTE — Progress Notes (Signed)
[facility] [phone]   [phone]  OFFICE PROGRESS NOTE  [name], MD 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  PRIOR THERAPY:  1) Stereotactic radiotherapy to a solitary brain metastasis under the care of Dr. Lisbeth Renshaw on 01/25/2017. 2) Short course of concurrent chemoradiation with weekly carboplatin for AUC of 2 and paclitaxel 45 MG/M2 to the locally advanced disease in the chest. Status post 3 cycles. 3) Systemic chemotherapy with carboplatin for AUC of 5, Alimta 500 MG/M2 and Avastin 15 MG/KG every 3 weeks. First dose of 02/15/2017. Status post 6 cycles. Last dose 06/08/2017 with stable disease. 4) whole brain irradiation under the care of Dr. Lisbeth Renshaw expected to complete on October 14, 2018. 5) Maintenance systemic chemotherapy with Alimta 500 MG/M2 in addition to Avastin 15 MG/KG every 3 weeks. First dose 07/13/2017.  Status post 20 cycles.  Starting from cycle #16 he will be treated with single agent Alimta only secondary to intolerance.   CURRENT THERAPY: Second line treatment with immunotherapy with nivolumab 480 mg IV every 4 weeks.  First dose October 26, 2018.  Status post 7 cycles.  INTERVAL HISTORY: Calvin Wagner 53 y.o. male returns to the clinic today for follow-up visit.  The patient is feeling fine today with no concerning complaints except for occasional nausea.  He is on multiple antiemetics medications.  He denied having any significant chest pain, shortness of breath, cough or hemoptysis.  He denied having any fever or chills.  He has no vomiting, diarrhea or constipation.  He has no skin rash.  He continues to  tolerate his treatment with nivolumab fairly well.  He is here for evaluation before starting cycle #8.   MEDICAL HISTORY: Past Medical History:  Diagnosis Date   Encounter for antineoplastic chemotherapy 12/31/2016   GERD 11/08/2009   Qualifier: Diagnosis of  By: Ronnald Ramp MD, Arvid Right.    Goals of care, counseling/discussion 12/31/2016   History of radiation therapy 01/13/2017 - 02/02/2017   Site/dose:  Left lung: 30 Gy in 15 fractions   HYPERTENSION 11/08/2009   Qualifier: Diagnosis of  By: Ronnald Ramp MD, Arvid Right.    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  MEDICATIONS:  Current Outpatient Medications  Medication Sig Dispense Refill   albuterol (PROVENTIL HFA;VENTOLIN HFA) 108 (90 Base) MCG/ACT inhaler Inhale 2 puffs into the lungs every 4 (four) hours as needed.     amLODipine (NORVASC) 5 MG tablet Take 5 mg by mouth daily.     budesonide (RA BUDESONIDE) 32 MCG/ACT nasal spray Place into both nostrils daily.     BUDESONIDE PO Take by mouth. Budesonide slurry     budesonide-formoterol (SYMBICORT) 80-4.5 MCG/ACT inhaler Inhale 2 puffs into the lungs 2 (two) times daily. 1 Inhaler 5   chlorpheniramine-HYDROcodone (TUSSIONEX) 10-8 MG/5ML SUER Take 5 mLs by mouth every 12 (twelve) hours as needed for cough. 473 mL 0   famotidine (PEPCID) 20 MG tablet Take 20 mg by mouth 2 (two) times daily.  folic acid (FOLVITE) 1 MG tablet Take 1 tablet (1 mg total) by mouth daily. 30 tablet 4   furosemide (LASIX) 20 MG tablet Take 1 tablet (20 mg total) by mouth daily as needed. 20 tablet 0   LORazepam (ATIVAN) 1 MG tablet TAKE 1-1 AND 1/2 TABSEVERY 4 HOURS AS NEEDED FOR ANXIETY (30 MINUTES PRIOR TO RADIOTHERAPY OR MRI SCANS, AND NAUSEA). 90 tablet 1   losartan (COZAAR) 50 MG tablet Take 50 mg by mouth daily.     magic  mouthwash SOLN Take 5 mLs by mouth 4 (four) times daily as needed for mouth pain. 1:1:1 mix-Nystatin. Benadryl and extra strength maalox. 240 mL 0   Multiple Vitamin (MULTIVITAMIN WITH MINERALS) TABS tablet Take 1 tablet by mouth daily.     ondansetron (ZOFRAN) 8 MG tablet TAKE 1 TABLET BY MOUTH EVERY 8 HOURS AS NEEDED FOR NAUSEA OR VOMITING. 90 tablet 0   pantoprazole (PROTONIX) 40 MG tablet Take 40 mg by mouth daily.  2   PROAIR HFA 108 (90 Base) MCG/ACT inhaler Inhale 2 puffs into the lungs every 4 (four) hours as needed.  5   prochlorperazine (COMPAZINE) 10 MG tablet TAKE 1 TABLET BY MOUTH EVERY 6 HOURS AS NEEDED FOR NAUSEA OR VOMITING 90 tablet 0   traMADol (ULTRAM) 50 MG tablet TAKE 1 TABLET BY MOUTH EVERY 12 HOURS AS NEEDED (Patient not taking: Reported on 04/10/2019) 30 tablet 0   XARELTO 20 MG TABS tablet TAKE 1 TABLET BY MOUTH EVERY DAY WITH SUPPER 90 tablet 0   No current facility-administered medications for this visit.     SURGICAL HISTORY:  Past Surgical History:  Procedure Laterality Date   INGUINAL HERNIA REPAIR     SHOULDER ARTHROSCOPY WITH ROTATOR CUFF REPAIR Left 11/12/2016   Procedure: SHOULDER ARTHROSCOPY WITH ROTATOR CUFF REPAIR AND SUBACROMIAL DECOMPRESSION;  Surgeon: Tania Ade, MD;  Location: [facility];  Service: Orthopedics;  Laterality: Left;  SHOULDER ARTHROSCOPY WITH ROTATOR CUFF REPAIR AND SUBACROMIAL DECOMPRESSION   TONSILLECTOMY     TOTAL KNEE ARTHROPLASTY     VIDEO BRONCHOSCOPY Bilateral 12/10/2016   Procedure: VIDEO BRONCHOSCOPY WITHOUT FLUORO;  Surgeon: Tanda Rockers, MD;  Location: [facility];  Service: Cardiopulmonary;  Laterality: Bilateral;    REVIEW OF SYSTEMS:  A comprehensive review of systems was negative except for: Gastrointestinal: positive for nausea   PHYSICAL EXAMINATION: General appearance: alert, cooperative and no distress Head: Normocephalic, without obvious abnormality, atraumatic Neck: no adenopathy, no JVD, supple,  symmetrical, trachea midline and thyroid not enlarged, symmetric, no tenderness/mass/nodules Lymph nodes: Cervical, supraclavicular, and axillary nodes normal. Resp: clear to auscultation bilaterally Back: symmetric, no curvature. ROM normal. No CVA tenderness. Cardio: regular rate and rhythm, S1, S2 normal, no murmur, click, rub or gallop GI: soft, non-tender; bowel sounds normal; no masses,  no organomegaly Extremities: extremities normal, atraumatic, no cyanosis or edema  ECOG PERFORMANCE STATUS: 1 - Symptomatic but completely ambulatory  Blood pressure 134/89, pulse (!) 111, temperature 98.2 F (36.8 C), temperature source Oral, resp. rate 20, height 6\' 1"  (1.854 m), weight 235 lb 3.2 oz (106.7 kg), SpO2 100 %.  LABORATORY DATA: Lab Results  Component Value Date   WBC 3.9 (L) 05/11/2019   HGB 16.9 05/11/2019   HCT 49.7 05/11/2019   MCV 101.4 (H) 05/11/2019   PLT 180 05/11/2019      Chemistry      Component Value Date/Time   NA 139 04/10/2019 0920   NA 138 10/08/2017 0907   K 3.9 04/10/2019  0920   K 4.1 10/08/2017 0907   CL 104 04/10/2019 0920   CO2 25 04/10/2019 0920   CO2 24 10/08/2017 0907   BUN 14 04/10/2019 0920   BUN 11.7 10/08/2017 0907   CREATININE 1.34 (H) 04/10/2019 0920   CREATININE 1.2 10/08/2017 0907      Component Value Date/Time   CALCIUM 9.1 04/10/2019 0920   CALCIUM 9.5 10/08/2017 0907   ALKPHOS 101 04/10/2019 0920   ALKPHOS 88 10/08/2017 0907   AST 19 04/10/2019 0920   AST 22 10/08/2017 0907   ALT 20 04/10/2019 0920   ALT 24 10/08/2017 0907   BILITOT 0.5 04/10/2019 0920   BILITOT 0.42 10/08/2017 9476       RADIOGRAPHIC STUDIES: Mr Jeri Cos LY Contrast  Result Date: 04/28/2019 CLINICAL DATA:  Follow-up multiple brain metastases. EXAM: MRI HEAD WITHOUT AND WITH CONTRAST TECHNIQUE: Multiplanar, multiecho pulse sequences of the brain and surrounding structures were obtained without and with intravenous contrast. CONTRAST:  63mL MULTIHANCE  GADOBENATE DIMEGLUMINE 529 MG/ML IV SOLN COMPARISON:  01/12/2019 multiple prior studies as distant as March 2018. FINDINGS: BRAIN New Lesions: 2 adjacent foci of peripheral enhancement with central necrosis in the inferior frontal region on the left, the larger more superior lesion measuring 14 x 8 mm and the smaller more inferior lesion measuring 11 x 5 mm. Regional vasogenic edema. This is the site of the initial treated lesion first seen in a pleural of 2018. Larger lesions: None. Stable or Smaller lesions: All other previous lesions identified in December of 2019 show resolution, without residual enhancement or abnormal FLAIR or T2 signal. Other Brain findings: No evidence of hydrocephalus or extra-axial collection. No evidence of ischemic infarction. Vascular: Major vessels at the base of the brain show flow. Skull and upper cervical spine: Negative Sinuses/Orbits: Ordinary mucosal inflammatory changes of the paranasal sinuses. Other: None IMPRESSION: 1. Two adjacent foci of irregular peripheral enhancement with central nonenhancement and surrounding vasogenic edema in the inferior frontal lobe on the left, at the site of the initial treated lesion first seen in March of 2018. 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 2.  Other previously treated lesions remain invisible. Electronically Signed   By: Nelson Chimes M.D.   On: 04/28/2019 09:44    ASSESSMENT AND PLAN: This is a very pleasant 53 years old white male with stage IV non-small cell lung cancer, high-grade neuroendocrine carcinoma presented with locally advanced disease in the chest as well as solitary brain metastasis and 2 liver lesions. The patient is status post stereotactic radiotherapy to the solitary brain metastasis as well as short course of concurrent chemoradiation to the locally advanced disease in the chest. He recently completed 6 cycles of systemic chemotherapy with carboplatin, Alimta and Avastin and  tolerated this treatment fairly well. He was started on maintenance treatment with Alimta and Avastin status post 15 cycles.  Avastin was discontinued secondary to intolerance to the combination treatment.  He is currently on treatment with single agent Alimta status post 5 cycles. He has been tolerating his treatment well. Unfortunately he was recently found to have multiple metastatic brain lesion and the patient underwent whole brain irradiation. His scan showed evidence for disease progression with enlargement of left lower lobe lung nodule in addition to hilar lymphadenopathy as well as progression of the liver lesion. He was started on treatment with nivolumab 480 mg IV every 4 weeks status post 7 cycles.  The patient has been tolerating this treatment well  with no concerning adverse effects. I recommended for him to proceed with cycle #8 today as planned. I will see him back for follow-up visit in 4 weeks for evaluation before starting cycle #9 with repeat CT scan of the chest, abdomen and pelvis for restaging of his disease. The patient was advised to call immediately if he has any concerning symptoms in the interval. I gave him refill of Magic mouthwash as well as Tussionex for cough as needed. The patient voices understanding of current disease status and treatment options and is in agreement with the current care plan. All questions were answered. The patient knows to call the clinic with any problems, questions or concerns. We can certainly see the patient much sooner if necessary.  Disclaimer: This note was dictated with voice recognition software. Similar sounding words can inadvertently be transcribed and may not be corrected upon review.

## 2019-05-11 NOTE — Telephone Encounter (Signed)
Change tussionex quantity-Please change to 240 ml -insurance will not cover more than this amount .

## 2019-05-24 ENCOUNTER — Other Ambulatory Visit: Payer: Self-pay | Admitting: *Deleted

## 2019-05-24 ENCOUNTER — Other Ambulatory Visit: Payer: Self-pay | Admitting: Internal Medicine

## 2019-05-24 DIAGNOSIS — K1379 Other lesions of oral mucosa: Secondary | ICD-10-CM

## 2019-05-24 DIAGNOSIS — I2609 Other pulmonary embolism with acute cor pulmonale: Secondary | ICD-10-CM

## 2019-05-24 MED ORDER — MAGIC MOUTHWASH: 5.0000 mL | Freq: Four times a day (QID) | ORAL | 0 refills | Status: DC | PRN

## 2019-06-06 ENCOUNTER — Encounter (HOSPITAL_COMMUNITY): Payer: Self-pay

## 2019-06-06 ENCOUNTER — Other Ambulatory Visit: Payer: Self-pay

## 2019-06-06 ENCOUNTER — Ambulatory Visit (HOSPITAL_COMMUNITY)
Admission: RE | Admit: 2019-06-06 | Discharge: 2019-06-06 | Disposition: A | Discharge status: Home / Self Care | Payer: Medicare Other | Source: Ambulatory Visit | Attending: Internal Medicine | Admitting: Internal Medicine

## 2019-06-06 DIAGNOSIS — C3490 Malignant neoplasm of unspecified part of unspecified bronchus or lung: Secondary | ICD-10-CM | POA: Diagnosis not present

## 2019-06-06 MED ORDER — IOHEXOL 300 MG/ML  SOLN
100.0000 mL | Freq: Once | INTRAMUSCULAR | Status: AC | PRN
  Administered 2019-06-06: 100 mL via INTRAVENOUS

## 2019-06-06 MED ORDER — SODIUM CHLORIDE (PF) 0.9 % IJ SOLN
INTRAMUSCULAR | Status: AC
  Filled 2019-06-06: qty 50

## 2019-06-08 ENCOUNTER — Inpatient Hospital Stay: Payer: Medicare Other | Attending: Internal Medicine

## 2019-06-08 ENCOUNTER — Encounter: Payer: Self-pay | Admitting: Internal Medicine

## 2019-06-08 ENCOUNTER — Other Ambulatory Visit: Payer: Self-pay

## 2019-06-08 ENCOUNTER — Inpatient Hospital Stay: Payer: Medicare Other

## 2019-06-08 ENCOUNTER — Inpatient Hospital Stay (HOSPITAL_BASED_OUTPATIENT_CLINIC_OR_DEPARTMENT_OTHER): Payer: Medicare Other | Admitting: Internal Medicine

## 2019-06-08 VITALS — BP 119/86 | HR 102 | Temp 98.7°F | Resp 18 | Ht 73.0 in | Wt 239.0 lb

## 2019-06-08 DIAGNOSIS — C3432 Malignant neoplasm of lower lobe, left bronchus or lung: Secondary | ICD-10-CM | POA: Diagnosis present

## 2019-06-08 DIAGNOSIS — Z7901 Long term (current) use of anticoagulants: Secondary | ICD-10-CM | POA: Diagnosis not present

## 2019-06-08 DIAGNOSIS — I10 Essential (primary) hypertension: Secondary | ICD-10-CM | POA: Insufficient documentation

## 2019-06-08 DIAGNOSIS — C7931 Secondary malignant neoplasm of brain: Secondary | ICD-10-CM

## 2019-06-08 DIAGNOSIS — C3492 Malignant neoplasm of unspecified part of left bronchus or lung: Secondary | ICD-10-CM

## 2019-06-08 DIAGNOSIS — C787 Secondary malignant neoplasm of liver and intrahepatic bile duct: Secondary | ICD-10-CM | POA: Insufficient documentation

## 2019-06-08 DIAGNOSIS — R05 Cough: Secondary | ICD-10-CM | POA: Insufficient documentation

## 2019-06-08 DIAGNOSIS — Z5112 Encounter for antineoplastic immunotherapy: Secondary | ICD-10-CM | POA: Diagnosis present

## 2019-06-08 DIAGNOSIS — Z79899 Other long term (current) drug therapy: Secondary | ICD-10-CM | POA: Diagnosis not present

## 2019-06-08 DIAGNOSIS — K219 Gastro-esophageal reflux disease without esophagitis: Secondary | ICD-10-CM | POA: Diagnosis not present

## 2019-06-08 DIAGNOSIS — Z7951 Long term (current) use of inhaled steroids: Secondary | ICD-10-CM | POA: Diagnosis not present

## 2019-06-08 LAB — CMP (CANCER CENTER ONLY)
ALT: 18 U/L (ref 0–44)
AST: 16 U/L (ref 15–41)
Albumin: 3.5 g/dL (ref 3.5–5.0)
Alkaline Phosphatase: 91 U/L (ref 38–126)
Anion gap: 8 (ref 5–15)
BUN: 12 mg/dL (ref 6–20)
CO2: 26 mmol/L (ref 22–32)
Calcium: 8.6 mg/dL — ABNORMAL LOW (ref 8.9–10.3)
Chloride: 103 mmol/L (ref 98–111)
Creatinine: 1.28 mg/dL — ABNORMAL HIGH (ref 0.61–1.24)
GFR, Est AFR Am: >60 mL/min (ref >60)
GFR, Estimated: >60 mL/min (ref >60)
Glucose, Bld: 78 mg/dL (ref 70–99)
Potassium: 3.9 mmol/L (ref 3.5–5.1)
Sodium: 137 mmol/L (ref 135–145)
Total Bilirubin: 0.6 mg/dL (ref 0.3–1.2)
Total Protein: 6.7 g/dL (ref 6.5–8.1)

## 2019-06-08 LAB — CBC WITH DIFFERENTIAL (CANCER CENTER ONLY)
Abs Immature Granulocytes: 0.01 10*3/uL (ref 0.00–0.07)
Basophils Absolute: 0 10*3/uL (ref 0.0–0.1)
Basophils Relative: 1 %
Eosinophils Absolute: 0.2 10*3/uL (ref 0.0–0.5)
Eosinophils Relative: 5 %
HCT: 44.8 % (ref 39.0–52.0)
Hemoglobin: 15.3 g/dL (ref 13.0–17.0)
Immature Granulocytes: 0 %
Lymphocytes Relative: 27 %
Lymphs Abs: 1.1 10*3/uL (ref 0.7–4.0)
MCH: 34.2 pg — ABNORMAL HIGH (ref 26.0–34.0)
MCHC: 34.2 g/dL (ref 30.0–36.0)
MCV: 100.2 fL — ABNORMAL HIGH (ref 80.0–100.0)
Monocytes Absolute: 0.5 10*3/uL (ref 0.1–1.0)
Monocytes Relative: 12 %
Neutro Abs: 2.3 10*3/uL (ref 1.7–7.7)
Neutrophils Relative %: 55 %
Platelet Count: 176 10*3/uL (ref 150–400)
RBC: 4.47 MIL/uL (ref 4.22–5.81)
RDW: 13.2 % (ref 11.5–15.5)
WBC Count: 4.1 10*3/uL (ref 4.0–10.5)
nRBC: 0 % (ref 0.0–0.2)

## 2019-06-08 LAB — TSH: TSH: 4.277 u[IU]/mL — ABNORMAL HIGH (ref 0.320–4.118)

## 2019-06-08 MED ORDER — SODIUM CHLORIDE 0.9 % IV SOLN
Freq: Once | INTRAVENOUS | Status: AC
  Administered 2019-06-08: 10:00:00 via INTRAVENOUS
  Filled 2019-06-08: qty 250

## 2019-06-08 MED ORDER — SODIUM CHLORIDE 0.9 % IV SOLN
480.0000 mg | Freq: Once | INTRAVENOUS | Status: AC
  Administered 2019-06-08: 480 mg via INTRAVENOUS
  Filled 2019-06-08: qty 48

## 2019-06-08 NOTE — Patient Instructions (Addendum)
[facility] Discharge Instructions for Patients Receiving Chemotherapy  Today you received the following chemotherapy agents:  Opdivo  To help prevent nausea and vomiting after your treatment, we encourage you to take your nausea medication as prescribed.   If you develop nausea and vomiting that is not controlled by your nausea medication, call the clinic.   BELOW ARE SYMPTOMS THAT SHOULD BE REPORTED IMMEDIATELY:  *FEVER GREATER THAN 100.5 F  *CHILLS WITH OR WITHOUT FEVER  NAUSEA AND VOMITING THAT IS NOT CONTROLLED WITH YOUR NAUSEA MEDICATION  *UNUSUAL SHORTNESS OF BREATH  *UNUSUAL BRUISING OR BLEEDING  TENDERNESS IN MOUTH AND THROAT WITH OR WITHOUT PRESENCE OF ULCERS  *URINARY PROBLEMS  *BOWEL PROBLEMS  UNUSUAL RASH Items with * indicate a potential emergency and should be followed up as soon as possible.  Feel free to call the clinic should you have any questions or concerns. The clinic phone number is [phone].  Please show the [facility] at check-in to the Emergency Department and triage nurse.  Coronavirus (COVID-19) Are you at risk?  Are you at risk for the Coronavirus (COVID-19)?  To be considered HIGH RISK for Coronavirus (COVID-19), you have to meet the following criteria:  . Traveled to [place], [place], [place], [place] or [place]; or in the [place] to [place], [place], [place], or [place]; and have fever, cough, and shortness of breath within the last 2 weeks of travel OR . Been in close contact with a person diagnosed with COVID-19 within the last 2 weeks and have fever, cough, and shortness of breath . IF YOU DO NOT MEET THESE CRITERIA, YOU ARE CONSIDERED LOW RISK FOR COVID-19.  What to do if you are HIGH RISK for COVID-19?  Marland Kitchen If you are having a medical emergency, call 911. . Seek medical care right away. Before you go to a doctor's office, urgent care or emergency department, call ahead and tell them about your  recent travel, contact with someone diagnosed with COVID-19, and your symptoms. You should receive instructions from your physician's office regarding next steps of care.  . When you arrive at healthcare provider, tell the healthcare staff immediately you have returned from visiting [place], [place], [place], [place] or [place]; or traveled in the [place] to [place], [place], [place], or [place]; in the last two weeks or you have been in close contact with a person diagnosed with COVID-19 in the last 2 weeks.   . Tell the health care staff about your symptoms: fever, cough and shortness of breath. . After you have been seen by a medical provider, you will be either: o Tested for (COVID-19) and discharged home on quarantine except to seek medical care if symptoms worsen, and asked to  - Stay home and avoid contact with others until you get your results (4-5 days)  - Avoid travel on public transportation if possible (such as bus, train, or airplane) or o Sent to the Emergency Department by EMS for evaluation, COVID-19 testing, and possible admission depending on your condition and test results.  What to do if you are LOW RISK for COVID-19?  Reduce your risk of any infection by using the same precautions used for avoiding the common cold or flu:  Marland Kitchen Wash your hands often with soap and warm water for at least 20 seconds.  If soap and water are not readily available, use an alcohol-based hand sanitizer with at least 60% alcohol.  . If coughing or  sneezing, cover your mouth and nose by coughing or sneezing into the elbow areas of your shirt or coat, into a tissue or into your sleeve (not your hands). . Avoid shaking hands with others and consider head nods or verbal greetings only. . Avoid touching your eyes, nose, or mouth with unwashed hands.  . Avoid close contact with people who are sick. . Avoid places or events with large numbers of people in one location, like concerts or sporting  events. . Carefully consider travel plans you have or are making. . If you are planning any travel outside or inside the [place], visit the CDC's Travelers' Health webpage for the latest health notices. . If you have some symptoms but not all symptoms, continue to monitor at home and seek medical attention if your symptoms worsen. . If you are having a medical emergency, call 911.   [facility] / e-Visit: [email or web address]         [facility]: [facility]: [phone]                   [facility]: [phone]

## 2019-06-08 NOTE — Progress Notes (Signed)
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  PRIOR THERAPY:  1) Stereotactic radiotherapy to a solitary brain metastasis under the care of Dr. Lisbeth Renshaw on 01/25/2017. 2) Short course of concurrent chemoradiation with weekly carboplatin for AUC of 2 and paclitaxel 45 MG/M2 to the locally advanced disease in the chest. Status post 3 cycles. 3) Systemic chemotherapy with carboplatin for AUC of 5, Alimta 500 MG/M2 and Avastin 15 MG/KG every 3 weeks. First dose of 02/15/2017. Status post 6 cycles. Last dose 06/08/2017 with stable disease. 4) whole brain irradiation under the care of Dr. Lisbeth Renshaw expected to complete on October 14, 2018. 5) Maintenance systemic chemotherapy with Alimta 500 MG/M2 in addition to Avastin 15 MG/KG every 3 weeks. First dose 07/13/2017.  Status post 20 cycles.  Starting from cycle #16 he will be treated with single agent Alimta only secondary to intolerance.   CURRENT THERAPY: Second line treatment with immunotherapy with nivolumab 480 mg IV every 4 weeks.  First dose October 26, 2018.  Status post 8 cycles.  INTERVAL HISTORY: Calvin Wagner 53 y.o. male returns to the clinic today for follow-up visit.  The patient is feeling fine today with no concerning complaints.  He mentions that the last months was 1 of his best time.  He denied having any current nausea, vomiting, diarrhea or constipation.  He denied having any headache or visual changes.  He has no chest pain, shortness of breath, cough or hemoptysis.  He has no recent weight loss or night  sweats.  He has been tolerating his treatment with nivolumab fairly well.  He had repeat CT scan of the chest, abdomen and pelvis performed recently and is here for evaluation and discussion of his scan results.   MEDICAL HISTORY: Past Medical History:  Diagnosis Date   Encounter for antineoplastic chemotherapy 12/31/2016   GERD 11/08/2009   Qualifier: Diagnosis of  By: Ronnald Ramp MD, Arvid Right.    Goals of care, counseling/discussion 12/31/2016   History of radiation therapy 01/13/2017 - 02/02/2017   Site/dose:  Left lung: 30 Gy in 15 fractions   HYPERTENSION 11/08/2009   Qualifier: Diagnosis of  By: Ronnald Ramp MD, Arvid Right.    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  MEDICATIONS:  Current Outpatient Medications  Medication Sig Dispense Refill   albuterol (PROVENTIL HFA;VENTOLIN HFA) 108 (90 Base) MCG/ACT inhaler Inhale 2 puffs into the lungs every 4 (four) hours as needed.     amLODipine (NORVASC) 5 MG tablet Take 5 mg by mouth daily.     budesonide (RA BUDESONIDE) 32 MCG/ACT nasal spray Place into both nostrils daily.     BUDESONIDE PO Take by mouth. Budesonide slurry     budesonide-formoterol (SYMBICORT) 80-4.5 MCG/ACT inhaler Inhale 2 puffs into the lungs 2 (two) times daily. 1 Inhaler 5   chlorpheniramine-HYDROcodone (TUSSIONEX) 10-8 MG/5ML SUER Take 5 mLs by mouth every 12 (twelve) hours as  needed for cough. 240 mL 0   famotidine (PEPCID) 20 MG tablet Take 20 mg by mouth 2 (two) times daily.     folic acid (FOLVITE) 1 MG tablet Take 1 tablet (1 mg total) by mouth daily. 30 tablet 4   furosemide (LASIX) 20 MG tablet Take 1 tablet (20 mg total) by mouth daily as needed. 20 tablet 0   LORazepam (ATIVAN) 1 MG tablet TAKE 1-1 AND 1/2 TABSEVERY 4 HOURS AS NEEDED FOR ANXIETY (30 MINUTES PRIOR TO RADIOTHERAPY OR MRI  SCANS, AND NAUSEA). 90 tablet 1   losartan (COZAAR) 50 MG tablet Take 50 mg by mouth daily.     magic mouthwash SOLN Take 5 mLs by mouth 4 (four) times daily as needed for mouth pain. 1:1:1 mix-Nystatin. Benadryl and extra strength maalox. 240 mL 0   Multiple Vitamin (MULTIVITAMIN WITH MINERALS) TABS tablet Take 1 tablet by mouth daily.     ondansetron (ZOFRAN) 8 MG tablet TAKE 1 TABLET BY MOUTH EVERY 8 HOURS AS NEEDED FOR NAUSEA OR VOMITING. 90 tablet 0   pantoprazole (PROTONIX) 40 MG tablet Take 40 mg by mouth daily.  2   PROAIR HFA 108 (90 Base) MCG/ACT inhaler Inhale 2 puffs into the lungs every 4 (four) hours as needed.  5   prochlorperazine (COMPAZINE) 10 MG tablet TAKE 1 TABLET BY MOUTH EVERY 6 HOURS AS NEEDED FOR NAUSEA OR VOMITING 90 tablet 0   traMADol (ULTRAM) 50 MG tablet TAKE 1 TABLET BY MOUTH EVERY 12 HOURS AS NEEDED (Patient not taking: Reported on 04/10/2019) 30 tablet 0   XARELTO 20 MG TABS tablet TAKE 1 TABLET BY MOUTH EVERY DAY WITH SUPPER 90 tablet 0   No current facility-administered medications for this visit.     SURGICAL HISTORY:  Past Surgical History:  Procedure Laterality Date   INGUINAL HERNIA REPAIR     SHOULDER ARTHROSCOPY WITH ROTATOR CUFF REPAIR Left 11/12/2016   Procedure: SHOULDER ARTHROSCOPY WITH ROTATOR CUFF REPAIR AND SUBACROMIAL DECOMPRESSION;  Surgeon: Tania Ade, MD;  Location: [facility];  Service: Orthopedics;  Laterality: Left;  SHOULDER ARTHROSCOPY WITH ROTATOR CUFF REPAIR AND SUBACROMIAL DECOMPRESSION   TONSILLECTOMY     TOTAL KNEE ARTHROPLASTY     VIDEO BRONCHOSCOPY Bilateral 12/10/2016   Procedure: VIDEO BRONCHOSCOPY WITHOUT FLUORO;  Surgeon: Tanda Rockers, MD;  Location: [facility];  Service: Cardiopulmonary;  Laterality: Bilateral;    REVIEW OF SYSTEMS:  Constitutional: negative Eyes: negative Ears, nose, mouth, throat, and face: negative Respiratory: negative Cardiovascular: negative Gastrointestinal:  negative Genitourinary:negative Integument/breast: negative Hematologic/lymphatic: negative Musculoskeletal:negative Neurological: negative Behavioral/Psych: negative Endocrine: negative Allergic/Immunologic: negative   PHYSICAL EXAMINATION: General appearance: alert, cooperative and no distress Head: Normocephalic, without obvious abnormality, atraumatic Neck: no adenopathy, no JVD, supple, symmetrical, trachea midline and thyroid not enlarged, symmetric, no tenderness/mass/nodules Lymph nodes: Cervical, supraclavicular, and axillary nodes normal. Resp: clear to auscultation bilaterally Back: symmetric, no curvature. ROM normal. No CVA tenderness. Cardio: regular rate and rhythm, S1, S2 normal, no murmur, click, rub or gallop GI: soft, non-tender; bowel sounds normal; no masses,  no organomegaly Extremities: extremities normal, atraumatic, no cyanosis or edema Neurologic: Alert and oriented X 3, normal strength and tone. Normal symmetric reflexes. Normal coordination and gait  ECOG PERFORMANCE STATUS: 1 - Symptomatic but completely ambulatory  Blood pressure 119/86, pulse (!) 102, temperature 98.7 F (37.1 C), temperature source Oral, resp. rate 18, height 6\' 1"  (1.854 m), weight 239 lb (108.4 kg), SpO2 99 %.  LABORATORY DATA: Lab Results  Component Value  Date   WBC 3.9 (L) 05/11/2019   HGB 16.9 05/11/2019   HCT 49.7 05/11/2019   MCV 101.4 (H) 05/11/2019   PLT 180 05/11/2019      Chemistry      Component Value Date/Time   NA 140 05/11/2019 0905   NA 138 10/08/2017 0907   K 4.2 05/11/2019 0905   K 4.1 10/08/2017 0907   CL 104 05/11/2019 0905   CO2 25 05/11/2019 0905   CO2 24 10/08/2017 0907   BUN 14 05/11/2019 0905   BUN 11.7 10/08/2017 0907   CREATININE 1.45 (H) 05/11/2019 0905   CREATININE 1.2 10/08/2017 0907      Component Value Date/Time   CALCIUM 9.4 05/11/2019 0905   CALCIUM 9.5 10/08/2017 0907   ALKPHOS 105 05/11/2019 0905   ALKPHOS 88 10/08/2017 0907    AST 17 05/11/2019 0905   AST 22 10/08/2017 0907   ALT 21 05/11/2019 0905   ALT 24 10/08/2017 0907   BILITOT 0.5 05/11/2019 0905   BILITOT 0.42 10/08/2017 0907       RADIOGRAPHIC STUDIES: Ct Chest W Contrast  Result Date: 06/07/2019 CLINICAL DATA:  Lung cancer with brain metastasis. Liver lesion. Diagnosis March 2018. Immunotherapy ongoing. EXAM: CT CHEST, ABDOMEN, AND PELVIS WITH CONTRAST TECHNIQUE: Multidetector CT imaging of the chest, abdomen and pelvis was performed following the standard protocol during bolus administration of intravenous contrast. CONTRAST:  138mL OMNIPAQUE IOHEXOL 300 MG/ML  SOLN COMPARISON:  March 15, 2019, January 17, 2018 FINDINGS: CT CHEST FINDINGS Cardiovascular: No significant vascular findings. Normal heart size. No pericardial effusion. Mediastinum/Nodes: No axillary supraclavicular adenopathy. No mediastinal hilar adenopathy. No pericardial effusion. Esophagus Lungs/Pleura: No suspicious pulmonary nodules. Perihilar fibrotic change in the LEFT lower lobe consistent with radiation treatment. No measurable nodularity. Musculoskeletal: No aggressive osseous lesion. CT ABDOMEN AND PELVIS FINDINGS Hepatobiliary: Small hypodense lesion in the subcapsular RIGHT hepatic lobe measures 6 mm (image 71/2) unchanged from most recent CT scan. No new hepatic lesions. Gallbladder normal. Pancreas: Pancreas is normal. No ductal dilatation. No pancreatic inflammation. Spleen: Normal spleen. Small splenule inferior to the spleen unchanged. Adrenals/urinary tract: Small ovoid LEFT adrenal nodule measures 1 cm by 1.5 cm (image 72/2) and compares to 0.9 x 1.4 cm on CT 03/15/2019 and new from CT scan 11/12/2017. Stomach/Bowel: Stomach, small bowel, appendix, and cecum are normal. The colon and rectosigmoid colon are normal. Vascular/Lymphatic: Abdominal aorta is normal caliber. There is no retroperitoneal or periportal lymphadenopathy. No pelvic lymphadenopathy. Reproductive: Prostate normal Other:  No free fluid. Musculoskeletal: No aggressive osseous lesion. IMPRESSION: Chest Impression: 1. No evidence of pulmonary nodularity. 2. Post radiation change in the LEFT lower lobe perihilar lung. 3. No lymphadenopathy. Abdomen / Pelvis Impression: 1. New nodule of the LEFT adrenal gland is concerning finding in patient with lung cancer. Recommend close attention on follow-up CT versus FDG PET scan. 2. Stable small hypodense lesion in the RIGHT hepatic lobe. No new hepatic lesions. 3. No abdominopelvic lymphadenopathy. 4. No skeletal metastasis Electronically Signed   By: Suzy Bouchard M.D.   On: 06/07/2019 08:50   Ct Abdomen Pelvis W Contrast  Result Date: 06/07/2019 CLINICAL DATA:  Lung cancer with brain metastasis. Liver lesion. Diagnosis March 2018. Immunotherapy ongoing. EXAM: CT CHEST, ABDOMEN, AND PELVIS WITH CONTRAST TECHNIQUE: Multidetector CT imaging of the chest, abdomen and pelvis was performed following the standard protocol during bolus administration of intravenous contrast. CONTRAST:  11mL OMNIPAQUE IOHEXOL 300 MG/ML  SOLN COMPARISON:  March 15, 2019, January 17, 2018 FINDINGS:  CT CHEST FINDINGS Cardiovascular: No significant vascular findings. Normal heart size. No pericardial effusion. Mediastinum/Nodes: No axillary supraclavicular adenopathy. No mediastinal hilar adenopathy. No pericardial effusion. Esophagus Lungs/Pleura: No suspicious pulmonary nodules. Perihilar fibrotic change in the LEFT lower lobe consistent with radiation treatment. No measurable nodularity. Musculoskeletal: No aggressive osseous lesion. CT ABDOMEN AND PELVIS FINDINGS Hepatobiliary: Small hypodense lesion in the subcapsular RIGHT hepatic lobe measures 6 mm (image 71/2) unchanged from most recent CT scan. No new hepatic lesions. Gallbladder normal. Pancreas: Pancreas is normal. No ductal dilatation. No pancreatic inflammation. Spleen: Normal spleen. Small splenule inferior to the spleen unchanged. Adrenals/urinary tract:  Small ovoid LEFT adrenal nodule measures 1 cm by 1.5 cm (image 72/2) and compares to 0.9 x 1.4 cm on CT 03/15/2019 and new from CT scan 11/12/2017. Stomach/Bowel: Stomach, small bowel, appendix, and cecum are normal. The colon and rectosigmoid colon are normal. Vascular/Lymphatic: Abdominal aorta is normal caliber. There is no retroperitoneal or periportal lymphadenopathy. No pelvic lymphadenopathy. Reproductive: Prostate normal Other: No free fluid. Musculoskeletal: No aggressive osseous lesion. IMPRESSION: Chest Impression: 1. No evidence of pulmonary nodularity. 2. Post radiation change in the LEFT lower lobe perihilar lung. 3. No lymphadenopathy. Abdomen / Pelvis Impression: 1. New nodule of the LEFT adrenal gland is concerning finding in patient with lung cancer. Recommend close attention on follow-up CT versus FDG PET scan. 2. Stable small hypodense lesion in the RIGHT hepatic lobe. No new hepatic lesions. 3. No abdominopelvic lymphadenopathy. 4. No skeletal metastasis Electronically Signed   By: Suzy Bouchard M.D.   On: 06/07/2019 08:50    ASSESSMENT AND PLAN: This is a very pleasant 53 years old white male with stage IV non-small cell lung cancer, high-grade neuroendocrine carcinoma presented with locally advanced disease in the chest as well as solitary brain metastasis and 2 liver lesions. The patient is status post stereotactic radiotherapy to the solitary brain metastasis as well as short course of concurrent chemoradiation to the locally advanced disease in the chest. He recently completed 6 cycles of systemic chemotherapy with carboplatin, Alimta and Avastin and tolerated this treatment fairly well. He was started on maintenance treatment with Alimta and Avastin status post 15 cycles.  Avastin was discontinued secondary to intolerance to the combination treatment.  He is currently on treatment with single agent Alimta status post 5 cycles. He has been tolerating his treatment  well. Unfortunately he was recently found to have multiple metastatic brain lesion and the patient underwent whole brain irradiation. His scan showed evidence for disease progression with enlargement of left lower lobe lung nodule in addition to hilar lymphadenopathy as well as progression of the liver lesion. He was started on treatment with nivolumab 480 mg IV every 4 weeks status post 8 cycles.  The patient is doing fine today with no concerning complaints. He has been tolerating his treatment with nivolumab fairly well. He had repeat CT scan of the chest, abdomen pelvis performed recently.  I personally and independently reviewed the scans and discussed the results with the patient today. Has a scan showed no concerning findings for disease progression except for the stable left adrenal gland nodule that was seen on the previous scan in June 2020. I recommended for the patient to continue his current treatment with nivolumab and he will proceed with cycle #9 today.  We will continue to monitor the left adrenal gland nodule closely on the upcoming imaging studies. He will come back for follow-up visit in 4 weeks for evaluation before the next cycle  of his treatment. I gave him refill of Magic mouthwash as well as Tussionex for cough as needed. The patient was advised to call immediately if he has any concerning symptoms in the interval. The patient voices understanding of current disease status and treatment options and is in agreement with the current care plan. All questions were answered. The patient knows to call the clinic with any problems, questions or concerns. We can certainly see the patient much sooner if necessary.  Disclaimer: This note was dictated with voice recognition software. Similar sounding words can inadvertently be transcribed and may not be corrected upon review.

## 2019-06-09 ENCOUNTER — Ambulatory Visit (HOSPITAL_COMMUNITY): Admission: RE | Admit: 2019-06-09 | Payer: Medicare Other | Source: Ambulatory Visit

## 2019-06-28 ENCOUNTER — Other Ambulatory Visit: Payer: Self-pay | Admitting: *Deleted

## 2019-06-28 DIAGNOSIS — C7931 Secondary malignant neoplasm of brain: Secondary | ICD-10-CM

## 2019-06-29 ENCOUNTER — Telehealth: Payer: Self-pay | Admitting: Internal Medicine

## 2019-06-29 NOTE — Telephone Encounter (Signed)
Returned patient's phone call regarding rescheduling September and October appointments, left a voicemail.

## 2019-06-29 NOTE — Telephone Encounter (Signed)
Patient returned phone call regarding rescheduling September and October appointments, per patient's request appointment has been rescheduled.

## 2019-07-03 ENCOUNTER — Other Ambulatory Visit: Payer: Self-pay | Admitting: Radiation Therapy

## 2019-07-05 ENCOUNTER — Encounter: Payer: Self-pay | Admitting: Physician Assistant

## 2019-07-05 ENCOUNTER — Telehealth: Payer: Self-pay | Admitting: Physician Assistant

## 2019-07-05 ENCOUNTER — Other Ambulatory Visit: Payer: Self-pay

## 2019-07-05 ENCOUNTER — Inpatient Hospital Stay: Payer: Medicare Other

## 2019-07-05 ENCOUNTER — Inpatient Hospital Stay: Payer: Medicare Other | Attending: Internal Medicine

## 2019-07-05 ENCOUNTER — Inpatient Hospital Stay (HOSPITAL_BASED_OUTPATIENT_CLINIC_OR_DEPARTMENT_OTHER): Payer: Medicare Other | Admitting: Physician Assistant

## 2019-07-05 VITALS — BP 133/94 | HR 83 | Temp 98.7°F | Resp 18 | Ht 73.0 in | Wt 239.4 lb

## 2019-07-05 DIAGNOSIS — Z5112 Encounter for antineoplastic immunotherapy: Secondary | ICD-10-CM

## 2019-07-05 DIAGNOSIS — C3492 Malignant neoplasm of unspecified part of left bronchus or lung: Secondary | ICD-10-CM

## 2019-07-05 DIAGNOSIS — Z923 Personal history of irradiation: Secondary | ICD-10-CM | POA: Insufficient documentation

## 2019-07-05 DIAGNOSIS — C7931 Secondary malignant neoplasm of brain: Secondary | ICD-10-CM | POA: Insufficient documentation

## 2019-07-05 DIAGNOSIS — I10 Essential (primary) hypertension: Secondary | ICD-10-CM | POA: Insufficient documentation

## 2019-07-05 DIAGNOSIS — Z9221 Personal history of antineoplastic chemotherapy: Secondary | ICD-10-CM | POA: Diagnosis not present

## 2019-07-05 DIAGNOSIS — Z7901 Long term (current) use of anticoagulants: Secondary | ICD-10-CM | POA: Insufficient documentation

## 2019-07-05 DIAGNOSIS — Z79899 Other long term (current) drug therapy: Secondary | ICD-10-CM | POA: Diagnosis not present

## 2019-07-05 DIAGNOSIS — C3432 Malignant neoplasm of lower lobe, left bronchus or lung: Secondary | ICD-10-CM | POA: Diagnosis present

## 2019-07-05 DIAGNOSIS — Z7951 Long term (current) use of inhaled steroids: Secondary | ICD-10-CM | POA: Insufficient documentation

## 2019-07-05 LAB — CMP (CANCER CENTER ONLY)
ALT: 17 U/L (ref 0–44)
AST: 15 U/L (ref 15–41)
Albumin: 3.8 g/dL (ref 3.5–5.0)
Alkaline Phosphatase: 90 U/L (ref 38–126)
Anion gap: 9 (ref 5–15)
BUN: 11 mg/dL (ref 6–20)
CO2: 26 mmol/L (ref 22–32)
Calcium: 8.9 mg/dL (ref 8.9–10.3)
Chloride: 105 mmol/L (ref 98–111)
Creatinine: 1.27 mg/dL — ABNORMAL HIGH (ref 0.61–1.24)
GFR, Est AFR Am: >60 mL/min (ref >60)
GFR, Estimated: >60 mL/min (ref >60)
Glucose, Bld: 163 mg/dL — ABNORMAL HIGH (ref 70–99)
Potassium: 3.8 mmol/L (ref 3.5–5.1)
Sodium: 140 mmol/L (ref 135–145)
Total Bilirubin: 0.6 mg/dL (ref 0.3–1.2)
Total Protein: 6.9 g/dL (ref 6.5–8.1)

## 2019-07-05 LAB — CBC WITH DIFFERENTIAL (CANCER CENTER ONLY)
Abs Immature Granulocytes: 0.01 10*3/uL (ref 0.00–0.07)
Basophils Absolute: 0 10*3/uL (ref 0.0–0.1)
Basophils Relative: 1 %
Eosinophils Absolute: 0.2 10*3/uL (ref 0.0–0.5)
Eosinophils Relative: 4 %
HCT: 44.7 % (ref 39.0–52.0)
Hemoglobin: 15.2 g/dL (ref 13.0–17.0)
Immature Granulocytes: 0 %
Lymphocytes Relative: 19 %
Lymphs Abs: 1 10*3/uL (ref 0.7–4.0)
MCH: 34.6 pg — ABNORMAL HIGH (ref 26.0–34.0)
MCHC: 34 g/dL (ref 30.0–36.0)
MCV: 101.8 fL — ABNORMAL HIGH (ref 80.0–100.0)
Monocytes Absolute: 0.4 10*3/uL (ref 0.1–1.0)
Monocytes Relative: 8 %
Neutro Abs: 3.5 10*3/uL (ref 1.7–7.7)
Neutrophils Relative %: 68 %
Platelet Count: 174 10*3/uL (ref 150–400)
RBC: 4.39 MIL/uL (ref 4.22–5.81)
RDW: 13 % (ref 11.5–15.5)
WBC Count: 5.1 10*3/uL (ref 4.0–10.5)
nRBC: 0 % (ref 0.0–0.2)

## 2019-07-05 LAB — TSH: TSH: 1.798 u[IU]/mL (ref 0.320–4.118)

## 2019-07-05 MED ORDER — SODIUM CHLORIDE 0.9 % IV SOLN
Freq: Once | INTRAVENOUS | Status: AC
  Administered 2019-07-05: 13:00:00 via INTRAVENOUS
  Filled 2019-07-05: qty 250

## 2019-07-05 MED ORDER — SODIUM CHLORIDE 0.9 % IV SOLN
480.0000 mg | Freq: Once | INTRAVENOUS | Status: AC
  Administered 2019-07-05: 480 mg via INTRAVENOUS
  Filled 2019-07-05: qty 48

## 2019-07-05 NOTE — Telephone Encounter (Signed)
Scheduled appt per 9/23 los - pt to get an updated schedule next visit.

## 2019-07-05 NOTE — Patient Instructions (Signed)
[facility] Discharge Instructions for Patients Receiving Chemotherapy  Today you received the following chemotherapy agents:  Opdivo  To help prevent nausea and vomiting after your treatment, we encourage you to take your nausea medication as prescribed.   If you develop nausea and vomiting that is not controlled by your nausea medication, call the clinic.   BELOW ARE SYMPTOMS THAT SHOULD BE REPORTED IMMEDIATELY:  *FEVER GREATER THAN 100.5 F  *CHILLS WITH OR WITHOUT FEVER  NAUSEA AND VOMITING THAT IS NOT CONTROLLED WITH YOUR NAUSEA MEDICATION  *UNUSUAL SHORTNESS OF BREATH  *UNUSUAL BRUISING OR BLEEDING  TENDERNESS IN MOUTH AND THROAT WITH OR WITHOUT PRESENCE OF ULCERS  *URINARY PROBLEMS  *BOWEL PROBLEMS  UNUSUAL RASH Items with * indicate a potential emergency and should be followed up as soon as possible.  Feel free to call the clinic should you have any questions or concerns. The clinic phone number is [phone].  Please show the [facility] at check-in to the Emergency Department and triage nurse.  Coronavirus (COVID-19) Are you at risk?  Are you at risk for the Coronavirus (COVID-19)?  To be considered HIGH RISK for Coronavirus (COVID-19), you have to meet the following criteria:  . Traveled to [place], [place], [place], [place] or [place]; or in the [place] to [place], [place], [place], or [place]; and have fever, cough, and shortness of breath within the last 2 weeks of travel OR . Been in close contact with a person diagnosed with COVID-19 within the last 2 weeks and have fever, cough, and shortness of breath . IF YOU DO NOT MEET THESE CRITERIA, YOU ARE CONSIDERED LOW RISK FOR COVID-19.  What to do if you are HIGH RISK for COVID-19?  Marland Kitchen If you are having a medical emergency, call 911. . Seek medical care right away. Before you go to a doctor's office, urgent care or emergency department, call ahead and tell them about your  recent travel, contact with someone diagnosed with COVID-19, and your symptoms. You should receive instructions from your physician's office regarding next steps of care.  . When you arrive at healthcare provider, tell the healthcare staff immediately you have returned from visiting [place], [place], [place], [place] or [place]; or traveled in the [place] to [place], [place], [place], or [place]; in the last two weeks or you have been in close contact with a person diagnosed with COVID-19 in the last 2 weeks.   . Tell the health care staff about your symptoms: fever, cough and shortness of breath. . After you have been seen by a medical provider, you will be either: o Tested for (COVID-19) and discharged home on quarantine except to seek medical care if symptoms worsen, and asked to  - Stay home and avoid contact with others until you get your results (4-5 days)  - Avoid travel on public transportation if possible (such as bus, train, or airplane) or o Sent to the Emergency Department by EMS for evaluation, COVID-19 testing, and possible admission depending on your condition and test results.  What to do if you are LOW RISK for COVID-19?  Reduce your risk of any infection by using the same precautions used for avoiding the common cold or flu:  Marland Kitchen Wash your hands often with soap and warm water for at least 20 seconds.  If soap and water are not readily available, use an alcohol-based hand sanitizer with at least 60% alcohol.  . If coughing or  sneezing, cover your mouth and nose by coughing or sneezing into the elbow areas of your shirt or coat, into a tissue or into your sleeve (not your hands). . Avoid shaking hands with others and consider head nods or verbal greetings only. . Avoid touching your eyes, nose, or mouth with unwashed hands.  . Avoid close contact with people who are sick. . Avoid places or events with large numbers of people in one location, like concerts or sporting  events. . Carefully consider travel plans you have or are making. . If you are planning any travel outside or inside the [place], visit the CDC's Travelers' Health webpage for the latest health notices. . If you have some symptoms but not all symptoms, continue to monitor at home and seek medical attention if your symptoms worsen. . If you are having a medical emergency, call 911.   [facility] / e-Visit: [email or web address]         [facility]: [facility]: [phone]                   [facility]: [phone]

## 2019-07-05 NOTE — Progress Notes (Signed)
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  PRIOR THERAPY: 1) Stereotactic radiotherapy to a solitary brain metastasis under the care of Dr. Lisbeth Renshaw on 01/25/2017. 2) Short course of concurrent chemoradiation with weekly carboplatin for AUC of 2 and paclitaxel 45 MG/M2 to the locally advanced disease in the chest. Status post 3 cycles. 3) Systemic chemotherapy with carboplatin for AUC of 5, Alimta 500 MG/M2 and Avastin 15 MG/KG every 3 weeks. First dose of 02/15/2017. Status post 6 cycles. Last dose 06/08/2017 with stable disease. 4) whole brain irradiation under the care of Dr. Lisbeth Renshaw expected to complete on October 14, 2018. 5) Maintenance systemic chemotherapy with Alimta 500 MG/M2 in addition to Avastin 15 MG/KG every 3 weeks. First dose 07/13/2017. Status post 20 cycles. Starting from cycle #16 he will be treated with single agent Alimta only secondary to intolerance.  CURRENT THERAPY: Second line treatment with immunotherapy with nivolumab 480 mg IV every 4 weeks. First dose October 26, 2018. Status post 9cycles.  INTERVAL HISTORY: Calvin Wagner 53 y.o. male returns to the clinic for a follow up visit. The patient is feeling well today without any concerning complaints except for seasonal allergies with mild nasal congestion and puffy eyes in the morning. The patient takes zyrtec for this concern. He denies eye erythema, discharge, pain, photophobia, or visual changes. The patient states that he continues to have his baseline cough, which is worse at night. He sometimes uses tussinex at night to help his  cough.   The patient continues to tolerate treatment with nivolumab well without any adverse effects except for mild nausea the week after treatment. He states that his nausea is well controlled when he takes his anti-emetic. Denies any fever, chills, night sweats, or weight loss. Denies any chest pain, shortness of breath, or hemoptysis. Denies any diarrhea or constipation. Denies any headache or visual changes. Denies any rashes or skin changes. The patient is here today for evaluation prior to starting cycle # 10  MEDICAL HISTORY: Past Medical History:  Diagnosis Date  . Encounter for antineoplastic chemotherapy 12/31/2016  . GERD 11/08/2009   Qualifier: Diagnosis of  By: Ronnald Ramp MD, Arvid Right Goals of care, counseling/discussion 12/31/2016  . History of radiation therapy 01/13/2017 - 02/02/2017   Site/dose:  Left lung: 30 Gy in 15 fractions  . HYPERTENSION 11/08/2009   Qualifier: Diagnosis of  By: Ronnald Ramp MD, Arvid Right.   . 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 Migraine 02/25/2012  . Pneumonia   . PONV (postoperative nausea and vomiting)   . Rotator cuff tear, left     ALLERGIES:  has No Known Allergies.  MEDICATIONS:  Current Outpatient Medications  Medication Sig Dispense Refill  . albuterol (PROVENTIL HFA;VENTOLIN HFA) 108 (90 Base) MCG/ACT inhaler Inhale 2 puffs into the lungs every 4 (four) hours as needed.    Marland Kitchen amLODipine (NORVASC) 5 MG tablet Take 5 mg by mouth daily.    . budesonide (RA BUDESONIDE) 32 MCG/ACT nasal spray Place into both nostrils daily.    . BUDESONIDE PO Take by mouth. Budesonide slurry    . budesonide-formoterol (SYMBICORT) 80-4.5 MCG/ACT  inhaler Inhale 2 puffs into the lungs 2 (two) times daily. 1 Inhaler 5  . chlorpheniramine-HYDROcodone (TUSSIONEX) 10-8 MG/5ML SUER Take 5 mLs by mouth every 12 (twelve) hours as needed for cough. 240 mL 0  . famotidine (PEPCID) 20 MG tablet Take 20 mg by mouth 2 (two) times daily.    . folic acid  (FOLVITE) 1 MG tablet Take 1 tablet (1 mg total) by mouth daily. 30 tablet 4  . furosemide (LASIX) 20 MG tablet Take 1 tablet (20 mg total) by mouth daily as needed. 20 tablet 0  . LORazepam (ATIVAN) 1 MG tablet TAKE 1-1 AND 1/2 TABSEVERY 4 HOURS AS NEEDED FOR ANXIETY (30 MINUTES PRIOR TO RADIOTHERAPY OR MRI SCANS, AND NAUSEA). 90 tablet 1  . losartan (COZAAR) 50 MG tablet Take 50 mg by mouth daily.    . magic mouthwash SOLN Take 5 mLs by mouth 4 (four) times daily as needed for mouth pain. 1:1:1 mix-Nystatin. Benadryl and extra strength maalox. 240 mL 0  . Multiple Vitamin (MULTIVITAMIN WITH MINERALS) TABS tablet Take 1 tablet by mouth daily.    . ondansetron (ZOFRAN) 8 MG tablet TAKE 1 TABLET BY MOUTH EVERY 8 HOURS AS NEEDED FOR NAUSEA OR VOMITING. 90 tablet 0  . pantoprazole (PROTONIX) 40 MG tablet Take 40 mg by mouth daily.  2  . PROAIR HFA 108 (90 Base) MCG/ACT inhaler Inhale 2 puffs into the lungs every 4 (four) hours as needed.  5  . prochlorperazine (COMPAZINE) 10 MG tablet TAKE 1 TABLET BY MOUTH EVERY 6 HOURS AS NEEDED FOR NAUSEA OR VOMITING 90 tablet 0  . traMADol (ULTRAM) 50 MG tablet TAKE 1 TABLET BY MOUTH EVERY 12 HOURS AS NEEDED (Patient not taking: Reported on 04/10/2019) 30 tablet 0  . XARELTO 20 MG TABS tablet TAKE 1 TABLET BY MOUTH EVERY DAY WITH SUPPER 90 tablet 0   No current facility-administered medications for this visit.    Facility-Administered Medications Ordered in Other Visits  Medication Dose Route Frequency Provider Last Rate Last Dose  . nivolumab (OPDIVO) 480 mg in sodium chloride 0.9 % 100 mL chemo infusion  480 mg Intravenous Once Curt Bears, MD        SURGICAL HISTORY:  Past Surgical History:  Procedure Laterality Date  . INGUINAL HERNIA REPAIR    . SHOULDER ARTHROSCOPY WITH ROTATOR CUFF REPAIR Left 11/12/2016   Procedure: SHOULDER ARTHROSCOPY WITH ROTATOR CUFF REPAIR AND SUBACROMIAL DECOMPRESSION;  Surgeon: Tania Ade, MD;  Location: [facility];   Service: Orthopedics;  Laterality: Left;  SHOULDER ARTHROSCOPY WITH ROTATOR CUFF REPAIR AND SUBACROMIAL DECOMPRESSION  . TONSILLECTOMY    . TOTAL KNEE ARTHROPLASTY    . VIDEO BRONCHOSCOPY Bilateral 12/10/2016   Procedure: VIDEO BRONCHOSCOPY WITHOUT FLUORO;  Surgeon: Tanda Rockers, MD;  Location: [facility];  Service: Cardiopulmonary;  Laterality: Bilateral;    REVIEW OF SYSTEMS:   Review of Systems  Constitutional: Negative for appetite change, chills, fatigue, fever and unexpected weight change.  HENT: Positive for post nasal drainage at night. Negative for mouth sores, nosebleeds, sore throat, facial pain/pressure and trouble swallowing.   Eyes: Positive for "puffy eyelids" in the morning with mild itching. Negative for diplopia, photophobia, erythema, eye discharge, or icterus.  Respiratory: Positive for cough worse at night. Negative for hemoptysis, shortness of breath and wheezing.   Cardiovascular: Negative for chest pain and leg swelling.  Gastrointestinal: Mild nausea with one episode of vomiting following the last treatment. Negative for abdominal pain, constipation, and diarrhea,  Genitourinary: Negative  for bladder incontinence, difficulty urinating, dysuria, frequency and hematuria.   Musculoskeletal: Negative for back pain, gait problem, neck pain and neck stiffness.  Skin: Negative for itching and rash.  Neurological: Negative for dizziness, extremity weakness, gait problem, headaches, light-headedness and seizures.  Hematological: Negative for adenopathy. Does not bruise/bleed easily.  Psychiatric/Behavioral: Negative for confusion, depression and sleep disturbance. The patient is not nervous/anxious.     PHYSICAL EXAMINATION:  Blood pressure (!) 133/94, pulse 83, temperature 98.7 F (37.1 C), temperature source Oral, resp. rate 18, height 6\' 1"  (1.854 m), weight 239 lb 6.4 oz (108.6 kg), SpO2 100 %.  ECOG PERFORMANCE STATUS: 1 - Symptomatic but completely  ambulatory  Physical Exam  Constitutional: Oriented to person, place, and time and well-developed, well-nourished, and in no distress.  HENT:  Head: Normocephalic and atraumatic.  Mouth/Throat: Oropharynx is clear and moist. No oropharyngeal exudate.  Eyes: Conjunctivae are normal. Right eye exhibits no discharge. Left eye exhibits no discharge. No scleral icterus.  Neck: Normal range of motion. Neck supple.  Cardiovascular: Normal rate, regular rhythm, normal heart sounds and intact distal pulses.   Pulmonary/Chest: Effort normal and breath sounds normal. No respiratory distress. No wheezes. No rales.  Abdominal: Soft. Epigastric hernia noted. Bowel sounds are normal. Exhibits no distension and no mass. There is no tenderness.  Musculoskeletal: Normal range of motion. Exhibits no edema.  Lymphadenopathy:    No cervical adenopathy.  Neurological: Alert and oriented to person, place, and time. Exhibits normal muscle tone. Gait normal. Coordination normal.  Skin: Skin is warm and dry. No rash noted. Not diaphoretic. No erythema. No pallor.  Psychiatric: Mood, memory and judgment normal.  Vitals reviewed.  LABORATORY DATA: Lab Results  Component Value Date   WBC 5.1 07/05/2019   HGB 15.2 07/05/2019   HCT 44.7 07/05/2019   MCV 101.8 (H) 07/05/2019   PLT 174 07/05/2019      Chemistry      Component Value Date/Time   NA 140 07/05/2019 1109   NA 138 10/08/2017 0907   K 3.8 07/05/2019 1109   K 4.1 10/08/2017 0907   CL 105 07/05/2019 1109   CO2 26 07/05/2019 1109   CO2 24 10/08/2017 0907   BUN 11 07/05/2019 1109   BUN 11.7 10/08/2017 0907   CREATININE 1.27 (H) 07/05/2019 1109   CREATININE 1.2 10/08/2017 0907      Component Value Date/Time   CALCIUM 8.9 07/05/2019 1109   CALCIUM 9.5 10/08/2017 0907   ALKPHOS 90 07/05/2019 1109   ALKPHOS 88 10/08/2017 0907   AST 15 07/05/2019 1109   AST 22 10/08/2017 0907   ALT 17 07/05/2019 1109   ALT 24 10/08/2017 0907   BILITOT 0.6  07/05/2019 1109   BILITOT 0.42 10/08/2017 0907       RADIOGRAPHIC STUDIES:  Ct Chest W Contrast  Result Date: 06/07/2019 CLINICAL DATA:  Lung cancer with brain metastasis. Liver lesion. Diagnosis March 2018. Immunotherapy ongoing. EXAM: CT CHEST, ABDOMEN, AND PELVIS WITH CONTRAST TECHNIQUE: Multidetector CT imaging of the chest, abdomen and pelvis was performed following the standard protocol during bolus administration of intravenous contrast. CONTRAST:  143mL OMNIPAQUE IOHEXOL 300 MG/ML  SOLN COMPARISON:  March 15, 2019, January 17, 2018 FINDINGS: CT CHEST FINDINGS Cardiovascular: No significant vascular findings. Normal heart size. No pericardial effusion. Mediastinum/Nodes: No axillary supraclavicular adenopathy. No mediastinal hilar adenopathy. No pericardial effusion. Esophagus Lungs/Pleura: No suspicious pulmonary nodules. Perihilar fibrotic change in the LEFT lower lobe consistent with radiation treatment. No measurable nodularity. Musculoskeletal:  No aggressive osseous lesion. CT ABDOMEN AND PELVIS FINDINGS Hepatobiliary: Small hypodense lesion in the subcapsular RIGHT hepatic lobe measures 6 mm (image 71/2) unchanged from most recent CT scan. No new hepatic lesions. Gallbladder normal. Pancreas: Pancreas is normal. No ductal dilatation. No pancreatic inflammation. Spleen: Normal spleen. Small splenule inferior to the spleen unchanged. Adrenals/urinary tract: Small ovoid LEFT adrenal nodule measures 1 cm by 1.5 cm (image 72/2) and compares to 0.9 x 1.4 cm on CT 03/15/2019 and new from CT scan 11/12/2017. Stomach/Bowel: Stomach, small bowel, appendix, and cecum are normal. The colon and rectosigmoid colon are normal. Vascular/Lymphatic: Abdominal aorta is normal caliber. There is no retroperitoneal or periportal lymphadenopathy. No pelvic lymphadenopathy. Reproductive: Prostate normal Other: No free fluid. Musculoskeletal: No aggressive osseous lesion. IMPRESSION: Chest Impression: 1. No evidence of  pulmonary nodularity. 2. Post radiation change in the LEFT lower lobe perihilar lung. 3. No lymphadenopathy. Abdomen / Pelvis Impression: 1. New nodule of the LEFT adrenal gland is concerning finding in patient with lung cancer. Recommend close attention on follow-up CT versus FDG PET scan. 2. Stable small hypodense lesion in the RIGHT hepatic lobe. No new hepatic lesions. 3. No abdominopelvic lymphadenopathy. 4. No skeletal metastasis Electronically Signed   By: Suzy Bouchard M.D.   On: 06/07/2019 08:50   Ct Abdomen Pelvis W Contrast  Result Date: 06/07/2019 CLINICAL DATA:  Lung cancer with brain metastasis. Liver lesion. Diagnosis March 2018. Immunotherapy ongoing. EXAM: CT CHEST, ABDOMEN, AND PELVIS WITH CONTRAST TECHNIQUE: Multidetector CT imaging of the chest, abdomen and pelvis was performed following the standard protocol during bolus administration of intravenous contrast. CONTRAST:  155mL OMNIPAQUE IOHEXOL 300 MG/ML  SOLN COMPARISON:  March 15, 2019, January 17, 2018 FINDINGS: CT CHEST FINDINGS Cardiovascular: No significant vascular findings. Normal heart size. No pericardial effusion. Mediastinum/Nodes: No axillary supraclavicular adenopathy. No mediastinal hilar adenopathy. No pericardial effusion. Esophagus Lungs/Pleura: No suspicious pulmonary nodules. Perihilar fibrotic change in the LEFT lower lobe consistent with radiation treatment. No measurable nodularity. Musculoskeletal: No aggressive osseous lesion. CT ABDOMEN AND PELVIS FINDINGS Hepatobiliary: Small hypodense lesion in the subcapsular RIGHT hepatic lobe measures 6 mm (image 71/2) unchanged from most recent CT scan. No new hepatic lesions. Gallbladder normal. Pancreas: Pancreas is normal. No ductal dilatation. No pancreatic inflammation. Spleen: Normal spleen. Small splenule inferior to the spleen unchanged. Adrenals/urinary tract: Small ovoid LEFT adrenal nodule measures 1 cm by 1.5 cm (image 72/2) and compares to 0.9 x 1.4 cm on CT  03/15/2019 and new from CT scan 11/12/2017. Stomach/Bowel: Stomach, small bowel, appendix, and cecum are normal. The colon and rectosigmoid colon are normal. Vascular/Lymphatic: Abdominal aorta is normal caliber. There is no retroperitoneal or periportal lymphadenopathy. No pelvic lymphadenopathy. Reproductive: Prostate normal Other: No free fluid. Musculoskeletal: No aggressive osseous lesion. IMPRESSION: Chest Impression: 1. No evidence of pulmonary nodularity. 2. Post radiation change in the LEFT lower lobe perihilar lung. 3. No lymphadenopathy. Abdomen / Pelvis Impression: 1. New nodule of the LEFT adrenal gland is concerning finding in patient with lung cancer. Recommend close attention on follow-up CT versus FDG PET scan. 2. Stable small hypodense lesion in the RIGHT hepatic lobe. No new hepatic lesions. 3. No abdominopelvic lymphadenopathy. 4. No skeletal metastasis Electronically Signed   By: Suzy Bouchard M.D.   On: 06/07/2019 08:50     ASSESSMENT/PLAN:  This is a very pleasant 53 year old Caucasian male with stage IV non-small cell lung cancer, high-grade neuroendocrine carcinoma.  He presented with locally advanced disease in the chest, a solitary  brain metastasis, and 2 liver lesions.  The patient was diagnosed in March 2018.  The patient is status post stereotactic radiotherapy to the solitary brain metastasis as well with a short course of concurrent chemoradiation to the locally advanced disease in the chest.  He then underwent 6 cycles of systemic chemotherapy with carboplatin, Alimta, and Avastin.  He tolerated treatment well.  He was then started on maintenance treatment with Alimta and Avastin.  He is status post 15 cycles.  Avastin was discontinued due to intolerance with combination therapy.  He then completed 5 more cycles of single agent Alimta.  This was discontinued due to developing multiple metastatic brain lesions and progression of the liver lesion.  He then completed whole  brain irradiation.  He is currently being treated with nivolumab 480 mg IV every 4 weeks.  He is status post 9 cycles and is tolerating it well without any adverse effects.  The patient was seen with Dr. Julien Nordmann. Labs were reviewed. We recommend that he proceed with cycle #10 today as scheduled.   We will see him back for a follow up visit in 4 weeks for evaluation before starting cycle #11  He will continue tussinex for his cough.   He will continue using zyrtec for his seasonal allergies.   The patient was advised to call immediately if he has any concerning symptoms in the interval. The patient voices understanding of current disease status and treatment options and is in agreement with the current care plan. All questions were answered. The patient knows to call the clinic with any problems, questions or concerns. We can certainly see the patient much sooner if necessary  No orders of the defined types were placed in this encounter.    Cassandra L Heilingoetter, PA-C 07/05/19  ADDENDUM: Hematology/Oncology Attending: I had a face-to-face encounter with the patient today.  I recommended his care plan.  This is a very pleasant 53 years old white male with metastatic non-small cell lung cancer, high-grade neuroendocrine carcinoma status post several chemotherapy regimens and he is currently on treatment with immunotherapy with nivolumab 480 mg IV every 4 weeks status post 9 cycles and has been tolerating this treatment well. He is here today for evaluation before starting cycle #10.  The patient is doing well and I recommended for him to proceed with the treatment today as planned.  He will come back for follow-up visit in 4 weeks for evaluation before the next cycle of his treatment.  For the dry cough he will continue on Tussionex and he will get refill of pro-air from his primary care physician. He was advised to call immediately if he has any concerning symptoms in the  interval.   Disclaimer: This note was dictated with voice recognition software. Similar sounding words can inadvertently be transcribed and may be missed upon review. Eilleen Kempf, MD 07/05/19

## 2019-07-06 ENCOUNTER — Other Ambulatory Visit: Payer: 59

## 2019-07-06 ENCOUNTER — Ambulatory Visit: Payer: 59

## 2019-07-06 ENCOUNTER — Ambulatory Visit: Payer: 59 | Admitting: Internal Medicine

## 2019-08-02 ENCOUNTER — Inpatient Hospital Stay: Payer: Medicare Other | Attending: Internal Medicine

## 2019-08-02 ENCOUNTER — Other Ambulatory Visit: Payer: Self-pay

## 2019-08-02 ENCOUNTER — Inpatient Hospital Stay (HOSPITAL_BASED_OUTPATIENT_CLINIC_OR_DEPARTMENT_OTHER): Payer: Medicare Other | Admitting: Physician Assistant

## 2019-08-02 ENCOUNTER — Inpatient Hospital Stay: Payer: Medicare Other

## 2019-08-02 VITALS — BP 135/87 | HR 106 | Temp 98.0°F | Resp 17 | Ht 73.0 in | Wt 242.6 lb

## 2019-08-02 VITALS — HR 114

## 2019-08-02 DIAGNOSIS — Z9221 Personal history of antineoplastic chemotherapy: Secondary | ICD-10-CM | POA: Insufficient documentation

## 2019-08-02 DIAGNOSIS — Z7951 Long term (current) use of inhaled steroids: Secondary | ICD-10-CM | POA: Insufficient documentation

## 2019-08-02 DIAGNOSIS — C787 Secondary malignant neoplasm of liver and intrahepatic bile duct: Secondary | ICD-10-CM | POA: Insufficient documentation

## 2019-08-02 DIAGNOSIS — Z923 Personal history of irradiation: Secondary | ICD-10-CM | POA: Diagnosis not present

## 2019-08-02 DIAGNOSIS — K1379 Other lesions of oral mucosa: Secondary | ICD-10-CM

## 2019-08-02 DIAGNOSIS — R05 Cough: Secondary | ICD-10-CM | POA: Insufficient documentation

## 2019-08-02 DIAGNOSIS — C3432 Malignant neoplasm of lower lobe, left bronchus or lung: Secondary | ICD-10-CM | POA: Insufficient documentation

## 2019-08-02 DIAGNOSIS — K219 Gastro-esophageal reflux disease without esophagitis: Secondary | ICD-10-CM | POA: Diagnosis not present

## 2019-08-02 DIAGNOSIS — Z7901 Long term (current) use of anticoagulants: Secondary | ICD-10-CM | POA: Diagnosis not present

## 2019-08-02 DIAGNOSIS — I10 Essential (primary) hypertension: Secondary | ICD-10-CM | POA: Diagnosis not present

## 2019-08-02 DIAGNOSIS — Z5112 Encounter for antineoplastic immunotherapy: Secondary | ICD-10-CM | POA: Diagnosis present

## 2019-08-02 DIAGNOSIS — C7931 Secondary malignant neoplasm of brain: Secondary | ICD-10-CM | POA: Diagnosis not present

## 2019-08-02 DIAGNOSIS — C7A1 Malignant poorly differentiated neuroendocrine tumors: Secondary | ICD-10-CM | POA: Insufficient documentation

## 2019-08-02 DIAGNOSIS — R110 Nausea: Secondary | ICD-10-CM | POA: Insufficient documentation

## 2019-08-02 DIAGNOSIS — C3492 Malignant neoplasm of unspecified part of left bronchus or lung: Secondary | ICD-10-CM

## 2019-08-02 DIAGNOSIS — Z79899 Other long term (current) drug therapy: Secondary | ICD-10-CM | POA: Diagnosis not present

## 2019-08-02 DIAGNOSIS — Z5111 Encounter for antineoplastic chemotherapy: Secondary | ICD-10-CM

## 2019-08-02 LAB — CBC WITH DIFFERENTIAL (CANCER CENTER ONLY)
Abs Immature Granulocytes: 0.01 10*3/uL (ref 0.00–0.07)
Basophils Absolute: 0 10*3/uL (ref 0.0–0.1)
Basophils Relative: 1 %
Eosinophils Absolute: 0.1 10*3/uL (ref 0.0–0.5)
Eosinophils Relative: 3 %
HCT: 44.8 % (ref 39.0–52.0)
Hemoglobin: 15.2 g/dL (ref 13.0–17.0)
Immature Granulocytes: 0 %
Lymphocytes Relative: 24 %
Lymphs Abs: 1.2 10*3/uL (ref 0.7–4.0)
MCH: 35.2 pg — ABNORMAL HIGH (ref 26.0–34.0)
MCHC: 33.9 g/dL (ref 30.0–36.0)
MCV: 103.7 fL — ABNORMAL HIGH (ref 80.0–100.0)
Monocytes Absolute: 0.4 10*3/uL (ref 0.1–1.0)
Monocytes Relative: 7 %
Neutro Abs: 3.3 10*3/uL (ref 1.7–7.7)
Neutrophils Relative %: 65 %
Platelet Count: 190 10*3/uL (ref 150–400)
RBC: 4.32 MIL/uL (ref 4.22–5.81)
RDW: 13.3 % (ref 11.5–15.5)
WBC Count: 5 10*3/uL (ref 4.0–10.5)
nRBC: 0 % (ref 0.0–0.2)

## 2019-08-02 LAB — CMP (CANCER CENTER ONLY)
ALT: 17 U/L (ref 0–44)
AST: 14 U/L — ABNORMAL LOW (ref 15–41)
Albumin: 3.5 g/dL (ref 3.5–5.0)
Alkaline Phosphatase: 86 U/L (ref 38–126)
Anion gap: 12 (ref 5–15)
BUN: 13 mg/dL (ref 6–20)
CO2: 24 mmol/L (ref 22–32)
Calcium: 9 mg/dL (ref 8.9–10.3)
Chloride: 104 mmol/L (ref 98–111)
Creatinine: 1.37 mg/dL — ABNORMAL HIGH (ref 0.61–1.24)
GFR, Est AFR Am: >60 mL/min (ref >60)
GFR, Estimated: 59 mL/min — ABNORMAL LOW (ref >60)
Glucose, Bld: 158 mg/dL — ABNORMAL HIGH (ref 70–99)
Potassium: 3.8 mmol/L (ref 3.5–5.1)
Sodium: 140 mmol/L (ref 135–145)
Total Bilirubin: 0.5 mg/dL (ref 0.3–1.2)
Total Protein: 6.9 g/dL (ref 6.5–8.1)

## 2019-08-02 LAB — TSH: TSH: 2.381 u[IU]/mL (ref 0.320–4.118)

## 2019-08-02 MED ORDER — MAGIC MOUTHWASH: 5.0000 mL | Freq: Four times a day (QID) | ORAL | 0 refills | Status: DC | PRN

## 2019-08-02 MED ORDER — SODIUM CHLORIDE 0.9 % IV SOLN
480.0000 mg | Freq: Once | INTRAVENOUS | Status: AC
  Administered 2019-08-02: 480 mg via INTRAVENOUS
  Filled 2019-08-02: qty 48

## 2019-08-02 MED ORDER — SODIUM CHLORIDE 0.9 % IV SOLN
Freq: Once | INTRAVENOUS | Status: AC
  Administered 2019-08-02: 12:00:00 via INTRAVENOUS
  Filled 2019-08-02: qty 250

## 2019-08-02 MED ORDER — HYDROCOD POLST-CPM POLST ER 10-8 MG/5ML PO SUER: 5.0000 mL | Freq: Two times a day (BID) | ORAL | 0 refills | Status: AC | PRN

## 2019-08-02 NOTE — Patient Instructions (Signed)
[facility] Discharge Instructions for Patients Receiving Chemotherapy  Today you received the following chemotherapy agents Opdivo  To help prevent nausea and vomiting after your treatment, we encourage you to take your nausea medication as directed   If you develop nausea and vomiting that is not controlled by your nausea medication, call the clinic.   BELOW ARE SYMPTOMS THAT SHOULD BE REPORTED IMMEDIATELY:  *FEVER GREATER THAN 100.5 F  *CHILLS WITH OR WITHOUT FEVER  NAUSEA AND VOMITING THAT IS NOT CONTROLLED WITH YOUR NAUSEA MEDICATION  *UNUSUAL SHORTNESS OF BREATH  *UNUSUAL BRUISING OR BLEEDING  TENDERNESS IN MOUTH AND THROAT WITH OR WITHOUT PRESENCE OF ULCERS  *URINARY PROBLEMS  *BOWEL PROBLEMS  UNUSUAL RASH Items with * indicate a potential emergency and should be followed up as soon as possible.  Feel free to call the clinic should you have any questions or concerns. The clinic phone number is [phone].  Please show the CHEMO ALERT CARD at check-in to the Emergency Department and triage nurse.   

## 2019-08-02 NOTE — Progress Notes (Signed)
OK to treat with HR 114 per Cassie Heilingoetter, PA

## 2019-08-02 NOTE — Progress Notes (Signed)
[facility] OFFICE PROGRESS NOTE  [name], MD 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  PRIOR THERAPY:  1) Stereotactic radiotherapy to a solitary brain metastasis under the care of Dr. Lisbeth Renshaw on 01/25/2017. 2) Short course of concurrent chemoradiation with weekly carboplatin for AUC of 2 and paclitaxel 45 MG/M2 to the locally advanced disease in the chest. Status post 3 cycles. 3) Systemic chemotherapy with carboplatin for AUC of 5, Alimta 500 MG/M2 and Avastin 15 MG/KG every 3 weeks. First dose of 02/15/2017. Status post 6 cycles. Last dose 06/08/2017 with stable disease. 4) whole brain irradiation under the care of Dr. Lisbeth Renshaw expected to complete on October 14, 2018. 5) Maintenance systemic chemotherapy with Alimta 500 MG/M2 in addition to Avastin 15 MG/KG every 3 weeks. First dose 07/13/2017. Status post 20 cycles. Starting from cycle #16 he will be treated with single agent Alimta only secondary to intolerance.  CURRENT THERAPY: Second line treatment with immunotherapy with nivolumab 480 mg IV every 4 weeks. First dose October 26, 2018. Status post10cycles.  INTERVAL HISTORY: Calvin Wagner 53 y.o. male returns to the clinic for a follow up visit. The patient is feeling fairly well today without any concerning complaints except for occasional nausea. He states that he experiences nausea about 1 week leading up to his treatment with immunotherapy. He takes zofran for nausea. He also reports his baseline cough in the morning for which he takes tussinex. He is requesting a refill of this today.   Otherwise, he is tolerating treatment well without any  other complaints. He states that once every 2 months or so, after he "shares a drink with someone", that get gets a sore in his mouth that is painful but will resolve on its own. He denies any sores at this time. He states that he uses his prior prescription of magic mouthwash when this happens.   He denies any recent fevers, chills, night sweats, or weight loss. He denies any shortness of breath, chest pain, or hemoptysis. He denies any vomiting, diarrhea, or constipation. He denies any headaches or visual changes. He is scheduled for a repeat brain MRI on 08/07/2019 and is scheduled for a follow up visit with radiation oncology on 08/09/2019. He denies any rashes or skin changes. He is here for evaluation before starting cycle #11.   MEDICAL HISTORY: Past Medical History:  Diagnosis Date  . Encounter for antineoplastic chemotherapy 12/31/2016  . GERD 11/08/2009   Qualifier: Diagnosis of  By: Ronnald Ramp MD, Arvid Right Goals of care, counseling/discussion 12/31/2016  . History of radiation therapy 01/13/2017 - 02/02/2017   Site/dose:  Left lung: 30 Gy in 15 fractions  . HYPERTENSION 11/08/2009   Qualifier: Diagnosis of  By: Ronnald Ramp MD, Arvid Right.   . 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 Migraine 02/25/2012  . Pneumonia   . PONV (postoperative nausea and vomiting)   . Rotator cuff tear, left     ALLERGIES:  has No Known Allergies.  MEDICATIONS:  Current Outpatient Medications  Medication Sig Dispense Refill  . albuterol (PROVENTIL HFA;VENTOLIN HFA) 108 (90 Base) MCG/ACT inhaler Inhale 2 puffs into the lungs every 4 (four) hours as needed.    Marland Kitchen  amLODipine (NORVASC) 5 MG tablet Take 5 mg by mouth daily.    . budesonide (RA BUDESONIDE) 32 MCG/ACT nasal spray Place into both nostrils daily.    . BUDESONIDE PO Take by mouth. Budesonide slurry    . budesonide-formoterol (SYMBICORT) 80-4.5 MCG/ACT inhaler Inhale 2 puffs into the lungs 2 (two) times daily. 1 Inhaler 5  .  chlorpheniramine-HYDROcodone (TUSSIONEX) 10-8 MG/5ML SUER Take 5 mLs by mouth every 12 (twelve) hours as needed for cough. 240 mL 0  . famotidine (PEPCID) 20 MG tablet Take 20 mg by mouth 2 (two) times daily.    . folic acid (FOLVITE) 1 MG tablet Take 1 tablet (1 mg total) by mouth daily. 30 tablet 4  . furosemide (LASIX) 20 MG tablet Take 1 tablet (20 mg total) by mouth daily as needed. 20 tablet 0  . losartan (COZAAR) 50 MG tablet Take 50 mg by mouth daily.    . magic mouthwash SOLN Take 5 mLs by mouth 4 (four) times daily as needed for mouth pain. 1:1:1 mix-Nystatin. Benadryl and extra strength maalox. 240 mL 0  . Multiple Vitamin (MULTIVITAMIN WITH MINERALS) TABS tablet Take 1 tablet by mouth daily.    . ondansetron (ZOFRAN) 8 MG tablet TAKE 1 TABLET BY MOUTH EVERY 8 HOURS AS NEEDED FOR NAUSEA OR VOMITING. 90 tablet 0  . pantoprazole (PROTONIX) 40 MG tablet Take 40 mg by mouth daily.  2  . PROAIR HFA 108 (90 Base) MCG/ACT inhaler Inhale 2 puffs into the lungs every 4 (four) hours as needed.  5  . XARELTO 20 MG TABS tablet TAKE 1 TABLET BY MOUTH EVERY DAY WITH SUPPER 90 tablet 0  . LORazepam (ATIVAN) 1 MG tablet TAKE 1-1 AND 1/2 TABSEVERY 4 HOURS AS NEEDED FOR ANXIETY (30 MINUTES PRIOR TO RADIOTHERAPY OR MRI SCANS, AND NAUSEA). (Patient not taking: Reported on 08/02/2019) 90 tablet 1  . prochlorperazine (COMPAZINE) 10 MG tablet TAKE 1 TABLET BY MOUTH EVERY 6 HOURS AS NEEDED FOR NAUSEA OR VOMITING (Patient not taking: Reported on 08/02/2019) 90 tablet 0  . traMADol (ULTRAM) 50 MG tablet TAKE 1 TABLET BY MOUTH EVERY 12 HOURS AS NEEDED (Patient not taking: Reported on 04/10/2019) 30 tablet 0   No current facility-administered medications for this visit.    Facility-Administered Medications Ordered in Other Visits  Medication Dose Route Frequency Provider Last Rate Last Dose  . nivolumab (OPDIVO) 480 mg in sodium chloride 0.9 % 100 mL chemo infusion  480 mg Intravenous Once Curt Bears, MD 296  mL/hr at 08/02/19 1245 480 mg at 08/02/19 1245    SURGICAL HISTORY:  Past Surgical History:  Procedure Laterality Date  . INGUINAL HERNIA REPAIR    . SHOULDER ARTHROSCOPY WITH ROTATOR CUFF REPAIR Left 11/12/2016   Procedure: SHOULDER ARTHROSCOPY WITH ROTATOR CUFF REPAIR AND SUBACROMIAL DECOMPRESSION;  Surgeon: Tania Ade, MD;  Location: [facility];  Service: Orthopedics;  Laterality: Left;  SHOULDER ARTHROSCOPY WITH ROTATOR CUFF REPAIR AND SUBACROMIAL DECOMPRESSION  . TONSILLECTOMY    . TOTAL KNEE ARTHROPLASTY    . VIDEO BRONCHOSCOPY Bilateral 12/10/2016   Procedure: VIDEO BRONCHOSCOPY WITHOUT FLUORO;  Surgeon: Tanda Rockers, MD;  Location: [facility];  Service: Cardiopulmonary;  Laterality: Bilateral;    REVIEW OF SYSTEMS:   Review of Systems  Constitutional: Negative for appetite change, chills, fatigue, fever and unexpected weight change.  HENT: Negative for mouth sores, nosebleeds, sore throat and trouble swallowing.   Eyes: Negative for eye problems and icterus.  Respiratory: Positive for cough in the  morning. Negative for hemoptysis, shortness of breath and wheezing.   Cardiovascular: Negative for chest pain and leg swelling.  Gastrointestinal: Positive for occasional nausea. Negative for abdominal pain, constipation, diarrhea, and vomiting.  Genitourinary: Negative for bladder incontinence, difficulty urinating, dysuria, frequency and hematuria.   Musculoskeletal: Negative for back pain, gait problem, neck pain and neck stiffness.  Skin: Negative for itching and rash.  Neurological: Negative for dizziness, extremity weakness, gait problem, headaches, light-headedness and seizures.  Hematological: Negative for adenopathy. Does not bruise/bleed easily.  Psychiatric/Behavioral: Negative for confusion, depression and sleep disturbance. The patient is not nervous/anxious.     PHYSICAL EXAMINATION:  Blood pressure 135/87, pulse (!) 106, temperature 98 F (36.7 C), temperature source  Temporal, resp. rate 17, height 6\' 1"  (1.854 m), weight 242 lb 9.6 oz (110 kg), SpO2 100 %.  ECOG PERFORMANCE STATUS: 1 - Symptomatic but completely ambulatory  Physical Exam  Constitutional: Oriented to person, place, and time and well-developed, well-nourished, and in no distress.  HENT:  Head: Normocephalic and atraumatic.  Mouth/Throat: Oropharynx is clear and moist. No oropharyngeal exudate.  Eyes: Conjunctivae are normal. Right eye exhibits no discharge. Left eye exhibits no discharge. No scleral icterus.  Neck: Normal range of motion. Neck supple.  Cardiovascular: Normal rate, regular rhythm, normal heart sounds and intact distal pulses.   Pulmonary/Chest: Effort normal and breath sounds normal. No respiratory distress. No wheezes. No rales.  Abdominal: Soft. Epigastric hernia noted. Bowel sounds are normal. Exhibits no distension and no mass. There is no tenderness.  Musculoskeletal: Normal range of motion. Exhibits no edema.  Lymphadenopathy:    No cervical adenopathy.  Neurological: Alert and oriented to person, place, and time. Exhibits normal muscle tone. Gait normal. Coordination normal.  Skin: Skin is warm and dry. No rash noted. Not diaphoretic. No erythema. No pallor.  Psychiatric: Mood, memory and judgment normal.  Vitals reviewed.  LABORATORY DATA: Lab Results  Component Value Date   WBC 5.0 08/02/2019   HGB 15.2 08/02/2019   HCT 44.8 08/02/2019   MCV 103.7 (H) 08/02/2019   PLT 190 08/02/2019      Chemistry      Component Value Date/Time   NA 140 08/02/2019 1051   NA 138 10/08/2017 0907   K 3.8 08/02/2019 1051   K 4.1 10/08/2017 0907   CL 104 08/02/2019 1051   CO2 24 08/02/2019 1051   CO2 24 10/08/2017 0907   BUN 13 08/02/2019 1051   BUN 11.7 10/08/2017 0907   CREATININE 1.37 (H) 08/02/2019 1051   CREATININE 1.2 10/08/2017 0907      Component Value Date/Time   CALCIUM 9.0 08/02/2019 1051   CALCIUM 9.5 10/08/2017 0907   ALKPHOS 86 08/02/2019 1051    ALKPHOS 88 10/08/2017 0907   AST 14 (L) 08/02/2019 1051   AST 22 10/08/2017 0907   ALT 17 08/02/2019 1051   ALT 24 10/08/2017 0907   BILITOT 0.5 08/02/2019 1051   BILITOT 0.42 10/08/2017 0907       RADIOGRAPHIC STUDIES:  No results found.   ASSESSMENT/PLAN:  This is a very pleasant 53 year old Caucasian male with stage IV non-small cell lung cancer, high-grade neuroendocrine carcinoma. He presented with locally advanced disease in the chest,a solitary brain metastasis,and 2 liver lesions. The patient was diagnosed in March 2018.  The patient is status post stereotactic radiotherapy to the solitary brain metastasisaswell with a short course of concurrent chemoradiation to the locally advanced disease in the chest.  He then underwent 6 cycles of  systemic chemotherapy with carboplatin, Alimta, and Avastin. He tolerated treatment well. He was then started on maintenance treatment with Alimta and Avastin. He is status post 15 cycles. Avastin was discontinued due to intolerance withcombination therapy. He then completed 5 more cycles of single agent Alimta. This was discontinued due to developing multiple metastatic brain lesions and progression of the liver lesion.  He then completed whole brain irradiation.  He is currently being treated with nivolumab 480 mg IV every 4 weeks. He is status post 10cycles and is tolerating it well without any adverse effects.  Labs were reviewed with the patient. He will proceed with cycle #11 today as scheduled.   I will arrange for a restaging CT scan of the chest, abdomen, and pelvis to be performed prior to his next visit  We will see him back for a follow up visit in 4 weeks for evaluation and to review his scan results before starting cycle #12.   I sent a refill of tussinex for his cough. Regarding his occasional mouth sores, I instructed the patient to take a picture and to alert [place] next time it happens so we can evaluate it since  mucositis is not a common adverse side effect of his current treatment. Likely mouth sore is of a different etiology.  The patient was advised to call immediately if he has any concerning symptoms in the interval. The patient voices understanding of current disease status and treatment options and is in agreement with the current care plan. All questions were answered. The patient knows to call the clinic with any problems, questions or concerns. We can certainly see the patient much sooner if necessary   Orders Placed This Encounter  Procedures  . CT Chest W Contrast    Standing Status:   Future    Standing Expiration Date:   08/01/2020    Order Specific Question:   ** REASON FOR EXAM (FREE TEXT)    Answer:   Restaging Lung Cancer    Order Specific Question:   If indicated for the ordered procedure, I authorize the administration of contrast media per Radiology protocol    Answer:   Yes    Order Specific Question:   Preferred imaging location?    Answer:   [facility]    Order Specific Question:   Radiology Contrast Protocol - do NOT remove file path    Answer:   [email or web address]  . CT Abdomen Pelvis W Contrast    Standing Status:   Future    Standing Expiration Date:   08/01/2020    Order Specific Question:   ** REASON FOR EXAM (FREE TEXT)    Answer:   Restaging Lung Cancer    Order Specific Question:   If indicated for the ordered procedure, I authorize the administration of contrast media per Radiology protocol    Answer:   Yes    Order Specific Question:   Preferred imaging location?    Answer:   [facility]    Order Specific Question:   Is Oral Contrast requested for this exam?    Answer:   Yes, Per Radiology protocol    Order Specific Question:   Radiology Contrast Protocol - do NOT remove file path    Answer:   \\[email or web address]     [place], PA-C 08/02/19

## 2019-08-03 ENCOUNTER — Ambulatory Visit: Payer: 59 | Admitting: Internal Medicine

## 2019-08-03 ENCOUNTER — Other Ambulatory Visit: Payer: 59

## 2019-08-03 ENCOUNTER — Telehealth: Payer: Self-pay | Admitting: Internal Medicine

## 2019-08-03 ENCOUNTER — Telehealth: Payer: Self-pay | Admitting: Physician Assistant

## 2019-08-03 ENCOUNTER — Ambulatory Visit: Payer: 59

## 2019-08-03 NOTE — Telephone Encounter (Signed)
Scheduled lab to go with CT scan per 10/21 los.  Left a VM of that appt date and time.

## 2019-08-03 NOTE — Telephone Encounter (Signed)
Returned patient's phone call regarding rescheduling an appointment, left a voicemail. 

## 2019-08-07 ENCOUNTER — Other Ambulatory Visit: Payer: 59

## 2019-08-07 ENCOUNTER — Ambulatory Visit
Admission: RE | Admit: 2019-08-07 | Discharge: 2019-08-07 | Disposition: A | Discharge status: Home / Self Care | Payer: Medicare Other | Source: Ambulatory Visit | Attending: Radiation Oncology | Admitting: Radiation Oncology

## 2019-08-07 DIAGNOSIS — C7931 Secondary malignant neoplasm of brain: Secondary | ICD-10-CM

## 2019-08-07 MED ORDER — GADOBENATE DIMEGLUMINE 529 MG/ML IV SOLN
20.0000 mL | Freq: Once | INTRAVENOUS | Status: AC | PRN
  Administered 2019-08-07: 20 mL via INTRAVENOUS

## 2019-08-08 ENCOUNTER — Other Ambulatory Visit: Payer: Self-pay | Admitting: Internal Medicine

## 2019-08-08 DIAGNOSIS — I2609 Other pulmonary embolism with acute cor pulmonale: Secondary | ICD-10-CM

## 2019-08-09 ENCOUNTER — Other Ambulatory Visit: Payer: Self-pay

## 2019-08-09 ENCOUNTER — Ambulatory Visit
Admission: RE | Admit: 2019-08-09 | Discharge: 2019-08-09 | Disposition: A | Discharge status: Home / Self Care | Payer: Medicare Other | Source: Ambulatory Visit | Attending: Radiation Oncology | Admitting: Radiation Oncology

## 2019-08-09 ENCOUNTER — Encounter: Payer: Self-pay | Admitting: Radiation Oncology

## 2019-08-09 DIAGNOSIS — C3492 Malignant neoplasm of unspecified part of left bronchus or lung: Secondary | ICD-10-CM

## 2019-08-09 DIAGNOSIS — C7931 Secondary malignant neoplasm of brain: Secondary | ICD-10-CM

## 2019-08-09 MED ORDER — DEXAMETHASONE 4 MG PO TABS: 4.0000 mg | ORAL_TABLET | Freq: Two times a day (BID) | ORAL | 0 refills | Status: DC

## 2019-08-09 NOTE — Progress Notes (Signed)
Radiation Oncology         [phone] ________________________________  Outpatient Follow Up - Conducted via telephone due to current COVID-19 concerns for limiting patient exposure  I spoke with the patient to conduct this consult visit via telephone to spare the patient unnecessary potential exposure in the healthcare setting during the current COVID-19 pandemic. The patient was notified in advance and was offered a WebEX/Mychart meeting to allow for face to face communication but prefers telephone encounter. 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  Interval Since Last Radiation: 9 months   09/29/2018 - 10/14/2018:  Whole Brain / 30 Gy in 10 fractions  01/13/17 - 02/02/17: Left lung: 30 Gy in 15 fractions  01/25/17 SRS Treatment: PTV1 Left Frontal 74mm: 20 Gy in 1 fraction   Narrative:  Calvin Wagner is a pleasant, 53 y.o. gentleman who was diagnosed with stage IV lung cancer in the spring of 2018. He received SRS to a left frontal lesion and he has responded well to this with complete resolution noted on imaging since 09/2017. He has also received radiotherapy to the lung. He was found to have progression in December 2019, and had multiple lesions in the brain, and received whole brain radiotherapy. Following that, he began receiving Nivolumab in January 2020. He continues on this with Dr. Julien Nordmann. He had a recent MRI of the brain on 04/27/2019 that showed enhancement about the left frontal lesion. 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 It was  recommended that we repeat his MRI in 3 months, and we did not start steroids due to his immunotherapy. 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 There was also an adjacent left frontal 2 mm enhancing lesion. In conference it was discussed that we could consider steroids depending on symptoms and have him meet with Dr. Mickeal Skinner for formal neurologic assessment, then repeat imaging in about 6 weeks.  On review of systems, the patient is not available for the call but his wife states he has been having some progressive headaches though he only gives her limited insight into his symptoms. She believes this is for fear of finding out more bad news. He had an episode over the weekend of dizziness, and a fall getting out of the car. He had not eaten well and sometimes gets car sick, so they attributed his symptoms to this. In retrospect though he has had some complaints of dizziness for a few weeks off and on but no additional falls or severe nausea. He has not verbalized concerns for fevers, chills, chest pain, or shortness of breath. No other complaints are noted.   Past Medical History:  Past Medical History:  Diagnosis Date   Encounter for antineoplastic chemotherapy 12/31/2016   GERD 11/08/2009   Qualifier: Diagnosis of  By: Ronnald Ramp MD, Arvid Right.    Goals of care, counseling/discussion 12/31/2016   History of radiation therapy 01/13/2017 - 02/02/2017   Site/dose:  Left lung: 30 Gy in 15 fractions   HYPERTENSION 11/08/2009  Qualifier: Diagnosis of  By: Ronnald Ramp MD, Arvid Right.    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   Migraine 02/25/2012   Pneumonia    PONV (postoperative nausea and vomiting)    Rotator cuff tear, left     Past Surgical History: Past Surgical History:  Procedure Laterality Date   INGUINAL HERNIA REPAIR     SHOULDER ARTHROSCOPY WITH  ROTATOR CUFF REPAIR Left 11/12/2016   Procedure: SHOULDER ARTHROSCOPY WITH ROTATOR CUFF REPAIR AND SUBACROMIAL DECOMPRESSION;  Surgeon: Tania Ade, MD;  Location: [facility];  Service: Orthopedics;  Laterality: Left;  SHOULDER ARTHROSCOPY WITH ROTATOR CUFF REPAIR AND SUBACROMIAL DECOMPRESSION   TONSILLECTOMY     TOTAL KNEE ARTHROPLASTY     VIDEO BRONCHOSCOPY Bilateral 12/10/2016   Procedure: VIDEO BRONCHOSCOPY WITHOUT FLUORO;  Surgeon: Tanda Rockers, MD;  Location: [facility];  Service: Cardiopulmonary;  Laterality: Bilateral;    Social History:  Social History   Socioeconomic History   Marital status: Married    Spouse name: Baker Janus   Number of children: 2   Years of education: Not on file   Highest education level: Not on file  Occupational History   Occupation: Psychologist, occupational: [employer] resource strain: Not on file   Food insecurity    Worry: Not on file    Inability: Not on file   Transportation needs    Medical: No    Non-medical: No  Tobacco Use   Smoking status: Never Smoker   Smokeless tobacco: Never Used  Substance and Sexual Activity   Alcohol use: Yes    Comment: social   Drug use: No   Sexual activity: Not Currently  Lifestyle   Physical activity    Days per week: Not on file    Minutes per session: Not on file   Stress: Not on file  Relationships   Social connections    Talks on phone: Not on file    Gets together: Not on file    Attends religious service: Not on file    Active member of club or organization: Not on file    Attends meetings of clubs or organizations: Not on file    Relationship status: Not on file   Intimate partner violence    Fear of current or ex partner: Not on file    Emotionally abused: Not on file    Physically abused: Not on file    Forced sexual activity: Not on file  Other Topics Concern   Not on file  Social History Narrative   Lives at home wife and 2  kids   Caffeine use: none    The patient is on disability and is no longer working at [employer] due to his disease. He lives in [place].  Family History: Family History  Problem Relation Age of Onset   Heart disease Mother 61   Lung cancer Other        family history   CAD Other        strong family history male and male <50   Mental retardation Other    Heart attack Brother 27   Alcohol abuse Neg Hx    Diabetes Neg Hx    Early death Neg Hx    Hyperlipidemia Neg Hx    Hypertension Neg Hx    Kidney disease Neg Hx    Stroke Neg Hx    Migraines Neg Hx  ALLERGIES:  has No Known Allergies.  Meds: Current Outpatient Medications  Medication Sig Dispense Refill   albuterol (PROVENTIL HFA;VENTOLIN HFA) 108 (90 Base) MCG/ACT inhaler Inhale 2 puffs into the lungs every 4 (four) hours as needed.     amLODipine (NORVASC) 5 MG tablet Take 5 mg by mouth daily.     budesonide (RA BUDESONIDE) 32 MCG/ACT nasal spray Place into both nostrils daily.     BUDESONIDE PO Take by mouth. Budesonide slurry     budesonide-formoterol (SYMBICORT) 80-4.5 MCG/ACT inhaler Inhale 2 puffs into the lungs 2 (two) times daily. 1 Inhaler 5   chlorpheniramine-HYDROcodone (TUSSIONEX) 10-8 MG/5ML SUER Take 5 mLs by mouth every 12 (twelve) hours as needed for cough. 240 mL 0   famotidine (PEPCID) 20 MG tablet Take 20 mg by mouth 2 (two) times daily.     folic acid (FOLVITE) 1 MG tablet Take 1 tablet (1 mg total) by mouth daily. 30 tablet 4   furosemide (LASIX) 20 MG tablet Take 1 tablet (20 mg total) by mouth daily as needed. 20 tablet 0   losartan (COZAAR) 50 MG tablet Take 50 mg by mouth daily.     magic mouthwash SOLN Take 5 mLs by mouth 4 (four) times daily as needed for mouth pain. 1:1:1 mix-Nystatin. Benadryl and extra strength maalox. 240 mL 0   Multiple Vitamin (MULTIVITAMIN WITH MINERALS) TABS tablet Take 1 tablet by mouth daily.     ondansetron (ZOFRAN) 8 MG  tablet TAKE 1 TABLET BY MOUTH EVERY 8 HOURS AS NEEDED FOR NAUSEA OR VOMITING. 90 tablet 0   pantoprazole (PROTONIX) 40 MG tablet Take 40 mg by mouth daily.  2   PROAIR HFA 108 (90 Base) MCG/ACT inhaler Inhale 2 puffs into the lungs every 4 (four) hours as needed.  5   XARELTO 20 MG TABS tablet TAKE 1 TABLET BY MOUTH EVERY DAY WITH SUPPER 90 tablet 0   dexamethasone (DECADRON) 4 MG tablet Take 1 tablet (4 mg total) by mouth 2 (two) times daily with a meal. 60 tablet 0   LORazepam (ATIVAN) 1 MG tablet TAKE 1-1 AND 1/2 TABSEVERY 4 HOURS AS NEEDED FOR ANXIETY (30 MINUTES PRIOR TO RADIOTHERAPY OR MRI SCANS, AND NAUSEA). (Patient not taking: Reported on 08/02/2019) 90 tablet 1   prochlorperazine (COMPAZINE) 10 MG tablet TAKE 1 TABLET BY MOUTH EVERY 6 HOURS AS NEEDED FOR NAUSEA OR VOMITING (Patient not taking: Reported on 08/02/2019) 90 tablet 0   traMADol (ULTRAM) 50 MG tablet TAKE 1 TABLET BY MOUTH EVERY 12 HOURS AS NEEDED (Patient not taking: Reported on 04/10/2019) 30 tablet 0   No current facility-administered medications for this encounter.     Physical Findings:  vitals were not taken for this visit.  Unable to assess due to nature of the call  Lab Findings: Lab Results  Component Value Date   WBC 5.0 08/02/2019   HGB 15.2 08/02/2019   HCT 44.8 08/02/2019   MCV 103.7 (H) 08/02/2019   PLT 190 08/02/2019     Radiographic Findings: Mr Jeri Cos EN Contrast  Result Date: 08/07/2019 CLINICAL DATA:  Metastatic lung cancer. Prior brain radiation 2018. Whole-brain radiation January 2020. EXAM: MRI HEAD WITHOUT AND WITH CONTRAST TECHNIQUE: Multiplanar, multiecho pulse sequences of the brain and surrounding structures were obtained without and with intravenous contrast. CONTRAST:  29mL MULTIHANCE GADOBENATE DIMEGLUMINE 529 MG/ML IV SOLN COMPARISON:  MRI head 04/27/2019, 01/12/2019 FINDINGS: Brain: Marked progression of edema in the left frontal lobe. Increased mass-effect now with  approximately 5 mm of subfalcine herniation. Marked progression of enhancement in the left frontal lobe anteriorly at the site of previous tumor. The area of enhancement shows necrosis and mild hemorrhage and now measures 43 x 23 mm on axial images. This extends down to the gyrus rectus. 2 mm enhancing lesion with slight edema has developed in the left posterior frontal operculum axial image 82. Multiple metastatic deposits have been treated previously and have resolved. Negative for hydrocephalus.  Negative for acute infarct. Vascular: Normal arterial flow voids. Skull and upper cervical spine: Negative Sinuses/Orbits: Moderate mucosal edema paranasal sinuses. Negative orbit Other: None IMPRESSION: Marked progression of edema and abnormal enhancement in the left frontal lobe compared with 04/27/2019. Increased mass-effect with midline shift. 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 New 2 mm enhancing lesion left posterior frontal operculum consistent with metastatic disease. Electronically Signed   By: Franchot Gallo M.D.   On: 08/07/2019 17:41    Impression/Plan: 1. 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 As he is symptomatic, I did reach out to Dr. Mickeal Skinner who recommends starting dexamethasone at 4 mg BID. This should help in the next day or so with his symptoms. He would like to see the patient early next week for formal neurologic assessment. We did discuss that we would like a short interval MRI in about 6 weeks time to reassess the 2 mm lesion in the left frontal lobe as well as the radionecrosis like changes. A new prescription was called to his pharmacy as well. We will notify Dr. Julien Nordmann as well  given his ongoing immunotherapy. . 2. Claustrophobia. The patient continues to use ativan prn MRI scans. We will follow this expectantly and provide refills as needed.   Given current concerns for patient exposure during the COVID-19 pandemic, this encounter was conducted via telephone.  The patient has given verbal consent for this type of encounter. The time spent during this encounter was 15 minutes and 50% of that time was spent in the coordination of his care. The attendants for this meeting included  Shona Simpson, [facility] and Pryor Curia  During the encounter, Shona Simpson [facility] was located at [facility] Radiation Oncology Department.  Mrs. Roback  was located at home.    Carola Rhine, PAC

## 2019-08-14 ENCOUNTER — Other Ambulatory Visit: Payer: Self-pay

## 2019-08-14 ENCOUNTER — Telehealth: Payer: Self-pay | Admitting: Internal Medicine

## 2019-08-14 ENCOUNTER — Inpatient Hospital Stay: Payer: Medicare Other | Attending: Internal Medicine | Admitting: Internal Medicine

## 2019-08-14 VITALS — BP 142/92 | HR 111 | Temp 98.0°F | Resp 18 | Ht 73.0 in | Wt 245.6 lb

## 2019-08-14 DIAGNOSIS — Z5112 Encounter for antineoplastic immunotherapy: Secondary | ICD-10-CM | POA: Diagnosis not present

## 2019-08-14 DIAGNOSIS — Z8249 Family history of ischemic heart disease and other diseases of the circulatory system: Secondary | ICD-10-CM | POA: Insufficient documentation

## 2019-08-14 DIAGNOSIS — Z7951 Long term (current) use of inhaled steroids: Secondary | ICD-10-CM | POA: Insufficient documentation

## 2019-08-14 DIAGNOSIS — Z23 Encounter for immunization: Secondary | ICD-10-CM | POA: Diagnosis not present

## 2019-08-14 DIAGNOSIS — C3432 Malignant neoplasm of lower lobe, left bronchus or lung: Secondary | ICD-10-CM | POA: Diagnosis present

## 2019-08-14 DIAGNOSIS — I10 Essential (primary) hypertension: Secondary | ICD-10-CM

## 2019-08-14 DIAGNOSIS — Z7901 Long term (current) use of anticoagulants: Secondary | ICD-10-CM | POA: Diagnosis not present

## 2019-08-14 DIAGNOSIS — R42 Dizziness and giddiness: Secondary | ICD-10-CM | POA: Insufficient documentation

## 2019-08-14 DIAGNOSIS — Z79899 Other long term (current) drug therapy: Secondary | ICD-10-CM

## 2019-08-14 DIAGNOSIS — R519 Headache, unspecified: Secondary | ICD-10-CM | POA: Insufficient documentation

## 2019-08-14 DIAGNOSIS — C3492 Malignant neoplasm of unspecified part of left bronchus or lung: Secondary | ICD-10-CM | POA: Diagnosis not present

## 2019-08-14 DIAGNOSIS — K219 Gastro-esophageal reflux disease without esophagitis: Secondary | ICD-10-CM | POA: Diagnosis not present

## 2019-08-14 DIAGNOSIS — Z923 Personal history of irradiation: Secondary | ICD-10-CM | POA: Diagnosis not present

## 2019-08-14 DIAGNOSIS — C787 Secondary malignant neoplasm of liver and intrahepatic bile duct: Secondary | ICD-10-CM | POA: Insufficient documentation

## 2019-08-14 DIAGNOSIS — Z801 Family history of malignant neoplasm of trachea, bronchus and lung: Secondary | ICD-10-CM | POA: Insufficient documentation

## 2019-08-14 DIAGNOSIS — C7931 Secondary malignant neoplasm of brain: Secondary | ICD-10-CM | POA: Diagnosis present

## 2019-08-14 MED ORDER — INFLUENZA VAC SPLIT QUAD 0.5 ML IM SUSY: 0.5000 mL | PREFILLED_SYRINGE | Freq: Once | INTRAMUSCULAR | Status: DC

## 2019-08-14 NOTE — Telephone Encounter (Signed)
Scheduled appt per 11/2 los,  Left a VM of the appt date and time.

## 2019-08-14 NOTE — Progress Notes (Signed)
[facility] at [facility] [place], [place] [address] [phone]   New Patient Evaluation  Date of Service: 08/14/19 Patient Name: ONEAL SCHOENBERGER Patient MRN: [id number] Patient DOB: 1966-01-06 Provider: Ventura Sellers, MD  Identifying Statement:  SACRAMENTO MONDS is a 53 y.o. male with Need for influenza vaccination [Z23] who presents for initial consultation and evaluation regarding cancer associated neurologic deficits.    Referring Provider: [name], MD [address] [place],  [place] [address]  Primary Cancer:  Oncologic History: Oncology History Overview Note  Patient presented with onset of cough then followed with scans.       Cancer ([place]) (Resolved)  11/25/2016 Initial Diagnosis   Cancer ([place])   11/25/2016 Imaging   CTA IMPRESSION: 1. Multiple bilateral acute pulmonary emboli, segmental to subsegmental size. 2. 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 There is complete obstruction of the lingular and left lower lobe airways. 1 cm pulmonary nodule in the left lateral costophrenic sulcus.   11/26/2016 Imaging   CT Abdomen No mass or adenopathy in the abdomen or pelvis   12/10/2016 Surgery   Operation: Video Flexible fiberoptic bronchoscopy, diagnostic    12/17/2016 Imaging   PET IMPRESSION: 1. Extensive left hilar and mediastinal all hypermetabolic lymphadenopathy, compatible with underlying malignancy. These findings could reflect either primary lymphoma or bronchogenic carcinoma such as a small cell carcinoma. 2. There is also a small left lower lobe pulmonary nodule measuring 11 mm which is mildly hypermetabolic. This could potentially represent the primary bronchogenic neoplasm, but it is un usual for a small nodule of this size to result in such extensive lymphadenopathy.   01/02/2017 Imaging   MRI Brain IMPRESSION:  Positive for a solitary 13 mm rim  enhancing metastasis in the left anterior inferior frontal gyrus (series 10, image 72. No associated edema or mass effect.   01/04/2017 -  Radiation Therapy   SIM Chest    01/11/2017 -  Chemotherapy   The patient had palonosetron (ALOXI) injection 0.25 mg, 0.25 mg, Intravenous,  Once, 1 of 7 cycles  CARBOplatin (PARAPLATIN) 280 mg in sodium chloride 0.9 % 250 mL chemo infusion, 280 mg (100 % of original dose 281.6 mg), Intravenous,  Once, 1 of 7 cycles Dose modification: 281.6 mg (original dose 281.6 mg, Cycle 1)  PACLitaxel (TAXOL) 102 mg in dextrose 5 % 250 mL chemo infusion ( for chemotherapy treatment.     Large cell carcinoma of left lung, stage 4 ([facility])  12/31/2016 Initial Diagnosis   Large cell carcinoma of left lung, stage 4 ([facility])   10/27/2018 -  Chemotherapy   The patient had nivolumab (OPDIVO) 480 mg in sodium chloride 0.9 % 100 mL chemo infusion, 480 mg, Intravenous, Once, 11 of 18 cycles Administration: 480 mg (10/27/2018), 480 mg (11/24/2018), 480 mg (12/22/2018), 480 mg (01/19/2019), 480 mg (02/16/2019), 480 mg (03/16/2019), 480 mg (04/10/2019), 480 mg (05/11/2019), 480 mg (06/08/2019), 480 mg (07/05/2019), 480 mg (08/02/2019)  for chemotherapy treatment.     CNS Oncologic History: 09/29/2018 - 10/14/2018: Whole Brain / 30 Gy in 10 fractions  01/25/17 SRS Treatment: PTV1Left Frontal 55mm: 20 Gy in 1 fraction   History of Present Illness: The patient's records from the referring physician were obtained and reviewed and the patient interviewed to confirm this HPI.  WIL SLAPE presents to clinic for evaluation following recent changes on CNS imaging.  He describes new onset of headaches, near daily, localized  to right side of his head.  There are no migrainous features.  After starting on decadron last week headaches have resolved in the interim.  He remains functionally independent and intact otherwise.  No issues with gait or cognition.  Doing well with single agent nivolumab since  the start of the year.    Medications: Current Outpatient Medications on File Prior to Visit  Medication Sig Dispense Refill  . albuterol (PROVENTIL HFA;VENTOLIN HFA) 108 (90 Base) MCG/ACT inhaler Inhale 2 puffs into the lungs every 4 (four) hours as needed.    Marland Kitchen amLODipine (NORVASC) 5 MG tablet Take 5 mg by mouth daily.    . budesonide-formoterol (SYMBICORT) 80-4.5 MCG/ACT inhaler Inhale 2 puffs into the lungs 2 (two) times daily. 1 Inhaler 5  . chlorpheniramine-HYDROcodone (TUSSIONEX) 10-8 MG/5ML SUER Take 5 mLs by mouth every 12 (twelve) hours as needed for cough. 240 mL 0  . dexamethasone (DECADRON) 4 MG tablet Take 1 tablet (4 mg total) by mouth 2 (two) times daily with a meal. 60 tablet 0  . famotidine (PEPCID) 20 MG tablet Take 20 mg by mouth 2 (two) times daily.    . folic acid (FOLVITE) 1 MG tablet Take 1 tablet (1 mg total) by mouth daily. 30 tablet 4  . losartan (COZAAR) 50 MG tablet Take 50 mg by mouth daily.    . magic mouthwash SOLN Take 5 mLs by mouth 4 (four) times daily as needed for mouth pain. 1:1:1 mix-Nystatin. Benadryl and extra strength maalox. 240 mL 0  . Multiple Vitamin (MULTIVITAMIN WITH MINERALS) TABS tablet Take 1 tablet by mouth daily.    Marland Kitchen PROAIR HFA 108 (90 Base) MCG/ACT inhaler Inhale 2 puffs into the lungs every 4 (four) hours as needed.  5  . XARELTO 20 MG TABS tablet TAKE 1 TABLET BY MOUTH EVERY DAY WITH SUPPER 90 tablet 0  . budesonide (RA BUDESONIDE) 32 MCG/ACT nasal spray Place into both nostrils daily.    . BUDESONIDE PO Take by mouth. Budesonide slurry    . furosemide (LASIX) 20 MG tablet Take 1 tablet (20 mg total) by mouth daily as needed. (Patient not taking: Reported on 08/14/2019) 20 tablet 0  . LORazepam (ATIVAN) 1 MG tablet TAKE 1-1 AND 1/2 TABSEVERY 4 HOURS AS NEEDED FOR ANXIETY (30 MINUTES PRIOR TO RADIOTHERAPY OR MRI SCANS, AND NAUSEA). (Patient not taking: Reported on 08/02/2019) 90 tablet 1  . ondansetron (ZOFRAN) 8 MG tablet TAKE 1 TABLET BY  MOUTH EVERY 8 HOURS AS NEEDED FOR NAUSEA OR VOMITING. (Patient not taking: Reported on 08/14/2019) 90 tablet 0  . pantoprazole (PROTONIX) 40 MG tablet Take 40 mg by mouth daily.  2  . prochlorperazine (COMPAZINE) 10 MG tablet TAKE 1 TABLET BY MOUTH EVERY 6 HOURS AS NEEDED FOR NAUSEA OR VOMITING (Patient not taking: Reported on 08/02/2019) 90 tablet 0  . traMADol (ULTRAM) 50 MG tablet TAKE 1 TABLET BY MOUTH EVERY 12 HOURS AS NEEDED (Patient not taking: Reported on 04/10/2019) 30 tablet 0   No current facility-administered medications on file prior to visit.     Allergies: No Known Allergies Past Medical History:  Past Medical History:  Diagnosis Date  . Encounter for antineoplastic chemotherapy 12/31/2016  . GERD 11/08/2009   Qualifier: Diagnosis of  By: Ronnald Ramp MD, Arvid Right Goals of care, counseling/discussion 12/31/2016  . History of radiation therapy 01/13/2017 - 02/02/2017   Site/dose:  Left lung: 30 Gy in 15 fractions  . HYPERTENSION 11/08/2009   Qualifier: Diagnosis  of  By: Ronnald Ramp MD, Arvid Right.   . Large cell carcinoma of left lung, stage 4 ([facility]) 12/31/2016  . Migraine 02/25/2012  . Pneumonia   . PONV (postoperative nausea and vomiting)   . Rotator cuff tear, left    Past Surgical History:  Past Surgical History:  Procedure Laterality Date  . INGUINAL HERNIA REPAIR    . SHOULDER ARTHROSCOPY WITH ROTATOR CUFF REPAIR Left 11/12/2016   Procedure: SHOULDER ARTHROSCOPY WITH ROTATOR CUFF REPAIR AND SUBACROMIAL DECOMPRESSION;  Surgeon: Tania Ade, MD;  Location: [facility];  Service: Orthopedics;  Laterality: Left;  SHOULDER ARTHROSCOPY WITH ROTATOR CUFF REPAIR AND SUBACROMIAL DECOMPRESSION  . TONSILLECTOMY    . TOTAL KNEE ARTHROPLASTY    . VIDEO BRONCHOSCOPY Bilateral 12/10/2016   Procedure: VIDEO BRONCHOSCOPY WITHOUT FLUORO;  Surgeon: Tanda Rockers, MD;  Location: [facility];  Service: Cardiopulmonary;  Laterality: Bilateral;   Social History:  Social History   Socioeconomic History   . Marital status: Married    Spouse name: Baker Janus  . Number of children: 2  . Years of education: Not on file  . Highest education level: Not on file  Occupational History  . Occupation: Psychologist, occupational: [employer]  Social Needs  . Financial resource strain: Not on file  . Food insecurity    Worry: Not on file    Inability: Not on file  . Transportation needs    Medical: No    Non-medical: No  Tobacco Use  . Smoking status: Never Smoker  . Smokeless tobacco: Never Used  Substance and Sexual Activity  . Alcohol use: Yes    Comment: social  . Drug use: No  . Sexual activity: Not Currently  Lifestyle  . Physical activity    Days per week: Not on file    Minutes per session: Not on file  . Stress: Not on file  Relationships  . Social Herbalist on phone: Not on file    Gets together: Not on file    Attends religious service: Not on file    Active member of club or organization: Not on file    Attends meetings of clubs or organizations: Not on file    Relationship status: Not on file  . Intimate partner violence    Fear of current or ex partner: Not on file    Emotionally abused: Not on file    Physically abused: Not on file    Forced sexual activity: Not on file  Other Topics Concern  . Not on file  Social History Narrative   Lives at home wife and 2 kids   Caffeine use: none     Family History:  Family History  Problem Relation Age of Onset  . Heart disease Mother 55  . Lung cancer Other        family history  . CAD Other        strong family history male and male <50  . Mental retardation Other   . Heart attack Brother 11  . Alcohol abuse Neg Hx   . Diabetes Neg Hx   . Early death Neg Hx   . Hyperlipidemia Neg Hx   . Hypertension Neg Hx   . Kidney disease Neg Hx   . Stroke Neg Hx   . Migraines Neg Hx     Review of Systems: Constitutional: Denies fevers, chills or abnormal weight loss Eyes: Denies blurriness of vision  Ears, nose, mouth, throat, and face:  Denies mucositis or sore throat Respiratory: Denies cough, dyspnea or wheezes Cardiovascular: Denies palpitation, chest discomfort or lower extremity swelling Gastrointestinal:  Denies nausea, constipation, diarrhea GU: Denies dysuria or incontinence Skin: Denies abnormal skin rashes Neurological: Per HPI Musculoskeletal: Denies joint pain, back or neck discomfort. No decrease in ROM Behavioral/Psych: Denies anxiety, disturbance in thought content, and mood instability   Physical Exam: Vitals:   08/14/19 1143  BP: (!) 142/92  Pulse: (!) 111  Resp: 18  Temp: 98 F (36.7 C)  SpO2: 100%   KPS: 90. General: Alert, cooperative, pleasant, in no acute distress Head: Normal EENT: No conjunctival injection or scleral icterus. Oral mucosa moist Lungs: Resp effort normal Cardiac: Regular rate and rhythm Abdomen: Soft, non-distended abdomen Skin: No rashes cyanosis or petechiae. Extremities: No clubbing or edema  Neurologic Exam: Mental Status: Awake, alert, attentive to examiner. Oriented to self and environment. Language is fluent with intact comprehension.  Cranial Nerves: Visual acuity is grossly normal. Visual fields are full. Extra-ocular movements intact. No ptosis. Face is symmetric, tongue midline. Motor: Tone and bulk are normal. Power is full in both arms and legs. Reflexes are symmetric, no pathologic reflexes present. Intact finger to nose bilaterally Sensory: Intact to light touch and temperature Gait: Normal    Labs: I have reviewed the data as listed    Component Value Date/Time   NA 140 08/02/2019 1051   NA 138 10/08/2017 0907   K 3.8 08/02/2019 1051   K 4.1 10/08/2017 0907   CL 104 08/02/2019 1051   CO2 24 08/02/2019 1051   CO2 24 10/08/2017 0907   GLUCOSE 158 (H) 08/02/2019 1051   GLUCOSE 138 10/08/2017 0907   BUN 13 08/02/2019 1051   BUN 11.7 10/08/2017 0907   CREATININE 1.37 (H) 08/02/2019 1051   CREATININE 1.2  10/08/2017 0907   CALCIUM 9.0 08/02/2019 1051   CALCIUM 9.5 10/08/2017 0907   PROT 6.9 08/02/2019 1051   PROT 7.5 10/08/2017 0907   ALBUMIN 3.5 08/02/2019 1051   ALBUMIN 3.6 10/08/2017 0907   AST 14 (L) 08/02/2019 1051   AST 22 10/08/2017 0907   ALT 17 08/02/2019 1051   ALT 24 10/08/2017 0907   ALKPHOS 86 08/02/2019 1051   ALKPHOS 88 10/08/2017 0907   BILITOT 0.5 08/02/2019 1051   BILITOT 0.42 10/08/2017 0907   GFRNONAA 59 (L) 08/02/2019 1051   GFRAA >60 08/02/2019 1051   Lab Results  Component Value Date   WBC 5.0 08/02/2019   NEUTROABS 3.3 08/02/2019   HGB 15.2 08/02/2019   HCT 44.8 08/02/2019   MCV 103.7 (H) 08/02/2019   PLT 190 08/02/2019    Imaging:  Mr Jeri Cos LF Contrast  Result Date: 08/07/2019 CLINICAL DATA:  Metastatic lung cancer. Prior brain radiation 2018. Whole-brain radiation January 2020. EXAM: MRI HEAD WITHOUT AND WITH CONTRAST TECHNIQUE: Multiplanar, multiecho pulse sequences of the brain and surrounding structures were obtained without and with intravenous contrast. CONTRAST:  89mL MULTIHANCE GADOBENATE DIMEGLUMINE 529 MG/ML IV SOLN COMPARISON:  MRI head 04/27/2019, 01/12/2019 FINDINGS: Brain: Marked progression of edema in the left frontal lobe. Increased mass-effect now with approximately 5 mm of subfalcine herniation. Marked progression of enhancement in the left frontal lobe anteriorly at the site of previous tumor. The area of enhancement shows necrosis and mild hemorrhage and now measures 43 x 23 mm on axial images. This extends down to the gyrus rectus. 2 mm enhancing lesion with slight edema has developed in the left posterior frontal operculum axial image  82. Multiple metastatic deposits have been treated previously and have resolved. Negative for hydrocephalus.  Negative for acute infarct. Vascular: Normal arterial flow voids. Skull and upper cervical spine: Negative Sinuses/Orbits: Moderate mucosal edema paranasal sinuses. Negative orbit Other: None  IMPRESSION: Marked progression of edema and abnormal enhancement in the left frontal lobe compared with 04/27/2019. Increased mass-effect with midline shift. 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 New 2 mm enhancing lesion left posterior frontal operculum consistent with metastatic disease. Electronically Signed   By: Franchot Gallo M.D.   On: 08/07/2019 17:41    [facility] Clinician Interpretation: I have personally reviewed the radiological images as listed.  My interpretation, in the context of the patient's clinical presentation, is treatment effect vs true progression   Assessment/Plan Brain Metastases  Mr. Cherne is clinically stable today from neurologic standpoint, as headaches have improved with dexamethasone.  Pattern of progression in the brain was reviewed in multidisciplinary brain/spine tumor board, as well as bedside with the patient.  Rate of change, localization with multiple RT exposures ([place] 2018, WBRT 2020), degree of edema, lack of symptoms, and exposure to immunotherapy; all make radio-inflammatory change more likely than organic tumor progression.  We recommended he decrease decadron to 4mg  daily x5 days, followed by 2mg  daily x5 days.  We will scheduled a phone visit at the end of next week to evaluate his response to this taper.    Of course careful attention paid to poor mix of steroids with immunotherapy, will not want to extend therapy past what is needed clinically.  We spent twenty additional minutes teaching regarding the natural history, biology, and historical experience in the treatment of neurologic complications of cancer. We also provided teaching sheets for the patient to take home as an additional resource.  We appreciate the opportunity to participate in the care of JHON MALLOZZI.  We will touch base next week via phone.  Next MRI brain should be performed in 2 months to evaluate frontal lobe changes.  All questions were answered. The patient knows to  call the clinic with any problems, questions or concerns. No barriers to learning were detected.  The total time spent in the encounter was 40 minutes and more than 50% was on counseling and review of test results   Ventura Sellers, MD Medical Director of Neuro-Oncology [facility] at [place] 08/14/19 5:18 PM

## 2019-08-17 NOTE — Addendum Note (Signed)
Addended by: Ventura Sellers on: 08/17/2019 04:56 PM   Modules accepted: Orders

## 2019-08-24 ENCOUNTER — Inpatient Hospital Stay (HOSPITAL_BASED_OUTPATIENT_CLINIC_OR_DEPARTMENT_OTHER): Payer: Medicare Other | Admitting: Internal Medicine

## 2019-08-24 DIAGNOSIS — C7931 Secondary malignant neoplasm of brain: Secondary | ICD-10-CM | POA: Diagnosis not present

## 2019-08-24 MED ORDER — DEXAMETHASONE 1 MG PO TABS: 1.0000 mg | ORAL_TABLET | Freq: Every day | ORAL | 0 refills | Status: DC

## 2019-08-24 NOTE — Progress Notes (Signed)
I connected with Haskel Khan on 08/24/19 at  9:30 AM EST by telephone visit and verified that I am speaking with the correct person using two identifiers.  I discussed the limitations, risks, security and privacy concerns of performing an evaluation and management service by telemedicine and the availability of in-person appointments. I also discussed with the patient that there may be a patient responsible charge related to this service. The patient expressed understanding and agreed to proceed.  Other persons participating in the visit and their role in the encounter:  n/a  Patient's location:  Home  Provider's location:  Office  Chief Complaint:  Progressive metastatic brain tumor  History of Present Ilness: Mr. 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  No recent headaches or seizures, no new deficits or complaints. Observations: Language fluent, cognition at baseline Assessment and Plan: Clinically stable Follow Up Instructions: Recommended decreasing decadron further to 1mg  daily x7 days, then stopping therapy.  He should return to clinic following MRI requested at the end of December following the holiday.  I discussed the assessment and treatment plan with the patient.  The patient was provided an opportunity to ask questions and all were answered.  The patient agreed with the plan and demonstrated understanding of the instructions.    The patient was advised to call back or seek an in-person evaluation if the symptoms worsen or if the condition fails to improve as anticipated.  I provided 5-10 minutes of non-face-to-face time during this enocunter.  Ventura Sellers, MD   I provided 10 minutes of non face-to-face telephone visit time during this encounter, and > 50% was spent counseling as documented under my assessment & plan.

## 2019-08-25 ENCOUNTER — Telehealth: Payer: Self-pay | Admitting: Internal Medicine

## 2019-08-25 NOTE — Telephone Encounter (Signed)
Scheduled appt per 11/12 los.  Was not able to reach the pt. Patient will be able to get a print out at his next scheduled appt.

## 2019-08-28 ENCOUNTER — Ambulatory Visit (HOSPITAL_COMMUNITY)
Admission: RE | Admit: 2019-08-28 | Discharge: 2019-08-28 | Disposition: A | Discharge status: Home / Self Care | Payer: Medicare Other | Source: Ambulatory Visit | Attending: Physician Assistant | Admitting: Physician Assistant

## 2019-08-28 ENCOUNTER — Other Ambulatory Visit: Payer: Self-pay

## 2019-08-28 ENCOUNTER — Inpatient Hospital Stay: Payer: Medicare Other

## 2019-08-28 ENCOUNTER — Encounter (HOSPITAL_COMMUNITY): Payer: Self-pay

## 2019-08-28 DIAGNOSIS — C3492 Malignant neoplasm of unspecified part of left bronchus or lung: Secondary | ICD-10-CM | POA: Diagnosis present

## 2019-08-28 DIAGNOSIS — Z5112 Encounter for antineoplastic immunotherapy: Secondary | ICD-10-CM | POA: Diagnosis not present

## 2019-08-28 LAB — CBC WITH DIFFERENTIAL (CANCER CENTER ONLY)
Abs Immature Granulocytes: 0.09 10*3/uL — ABNORMAL HIGH (ref 0.00–0.07)
Basophils Absolute: 0 10*3/uL (ref 0.0–0.1)
Basophils Relative: 0 %
Eosinophils Absolute: 0 10*3/uL (ref 0.0–0.5)
Eosinophils Relative: 0 %
HCT: 43.3 % (ref 39.0–52.0)
Hemoglobin: 14.9 g/dL (ref 13.0–17.0)
Immature Granulocytes: 1 %
Lymphocytes Relative: 16 %
Lymphs Abs: 1.4 10*3/uL (ref 0.7–4.0)
MCH: 35.1 pg — ABNORMAL HIGH (ref 26.0–34.0)
MCHC: 34.4 g/dL (ref 30.0–36.0)
MCV: 101.9 fL — ABNORMAL HIGH (ref 80.0–100.0)
Monocytes Absolute: 0.7 10*3/uL (ref 0.1–1.0)
Monocytes Relative: 8 %
Neutro Abs: 6.4 10*3/uL (ref 1.7–7.7)
Neutrophils Relative %: 75 %
Platelet Count: 184 10*3/uL (ref 150–400)
RBC: 4.25 MIL/uL (ref 4.22–5.81)
RDW: 13.4 % (ref 11.5–15.5)
WBC Count: 8.6 10*3/uL (ref 4.0–10.5)
nRBC: 0 % (ref 0.0–0.2)

## 2019-08-28 LAB — CMP (CANCER CENTER ONLY)
ALT: 46 U/L — ABNORMAL HIGH (ref 0–44)
AST: 21 U/L (ref 15–41)
Albumin: 3.6 g/dL (ref 3.5–5.0)
Alkaline Phosphatase: 78 U/L (ref 38–126)
Anion gap: 11 (ref 5–15)
BUN: 21 mg/dL — ABNORMAL HIGH (ref 6–20)
CO2: 29 mmol/L (ref 22–32)
Calcium: 9 mg/dL (ref 8.9–10.3)
Chloride: 99 mmol/L (ref 98–111)
Creatinine: 1.26 mg/dL — ABNORMAL HIGH (ref 0.61–1.24)
GFR, Est AFR Am: >60 mL/min (ref >60)
GFR, Estimated: >60 mL/min (ref >60)
Glucose, Bld: 93 mg/dL (ref 70–99)
Potassium: 4.1 mmol/L (ref 3.5–5.1)
Sodium: 139 mmol/L (ref 135–145)
Total Bilirubin: 0.6 mg/dL (ref 0.3–1.2)
Total Protein: 6.9 g/dL (ref 6.5–8.1)

## 2019-08-28 LAB — TSH: TSH: 1.528 u[IU]/mL (ref 0.320–4.118)

## 2019-08-28 MED ORDER — IOHEXOL 300 MG/ML  SOLN
100.0000 mL | Freq: Once | INTRAMUSCULAR | Status: AC | PRN
  Administered 2019-08-28: 100 mL via INTRAVENOUS

## 2019-08-28 MED ORDER — SODIUM CHLORIDE (PF) 0.9 % IJ SOLN
INTRAMUSCULAR | Status: AC
  Filled 2019-08-28: qty 50

## 2019-08-29 ENCOUNTER — Other Ambulatory Visit: Payer: Self-pay | Admitting: Radiation Therapy

## 2019-08-30 ENCOUNTER — Inpatient Hospital Stay: Payer: Medicare Other

## 2019-08-30 ENCOUNTER — Other Ambulatory Visit: Payer: Self-pay

## 2019-08-30 ENCOUNTER — Encounter: Payer: Self-pay | Admitting: Internal Medicine

## 2019-08-30 ENCOUNTER — Inpatient Hospital Stay (HOSPITAL_BASED_OUTPATIENT_CLINIC_OR_DEPARTMENT_OTHER): Payer: Medicare Other | Admitting: Internal Medicine

## 2019-08-30 VITALS — BP 135/97 | HR 98 | Temp 98.2°F | Resp 20 | Ht 73.0 in | Wt 245.0 lb

## 2019-08-30 DIAGNOSIS — I10 Essential (primary) hypertension: Secondary | ICD-10-CM | POA: Diagnosis not present

## 2019-08-30 DIAGNOSIS — C3492 Malignant neoplasm of unspecified part of left bronchus or lung: Secondary | ICD-10-CM

## 2019-08-30 DIAGNOSIS — Z5112 Encounter for antineoplastic immunotherapy: Secondary | ICD-10-CM

## 2019-08-30 LAB — CMP (CANCER CENTER ONLY)
ALT: 34 U/L (ref 0–44)
AST: 12 U/L — ABNORMAL LOW (ref 15–41)
Albumin: 3.6 g/dL (ref 3.5–5.0)
Alkaline Phosphatase: 76 U/L (ref 38–126)
Anion gap: 12 (ref 5–15)
BUN: 19 mg/dL (ref 6–20)
CO2: 27 mmol/L (ref 22–32)
Calcium: 9 mg/dL (ref 8.9–10.3)
Chloride: 101 mmol/L (ref 98–111)
Creatinine: 1.31 mg/dL — ABNORMAL HIGH (ref 0.61–1.24)
GFR, Est AFR Am: >60 mL/min (ref >60)
GFR, Estimated: >60 mL/min (ref >60)
Glucose, Bld: 101 mg/dL — ABNORMAL HIGH (ref 70–99)
Potassium: 4.1 mmol/L (ref 3.5–5.1)
Sodium: 140 mmol/L (ref 135–145)
Total Bilirubin: 0.6 mg/dL (ref 0.3–1.2)
Total Protein: 6.9 g/dL (ref 6.5–8.1)

## 2019-08-30 LAB — CBC WITH DIFFERENTIAL (CANCER CENTER ONLY)
Abs Immature Granulocytes: 0.03 10*3/uL (ref 0.00–0.07)
Basophils Absolute: 0 10*3/uL (ref 0.0–0.1)
Basophils Relative: 0 %
Eosinophils Absolute: 0 10*3/uL (ref 0.0–0.5)
Eosinophils Relative: 0 %
HCT: 45.4 % (ref 39.0–52.0)
Hemoglobin: 15.6 g/dL (ref 13.0–17.0)
Immature Granulocytes: 0 %
Lymphocytes Relative: 19 %
Lymphs Abs: 1.4 10*3/uL (ref 0.7–4.0)
MCH: 35.4 pg — ABNORMAL HIGH (ref 26.0–34.0)
MCHC: 34.4 g/dL (ref 30.0–36.0)
MCV: 102.9 fL — ABNORMAL HIGH (ref 80.0–100.0)
Monocytes Absolute: 0.5 10*3/uL (ref 0.1–1.0)
Monocytes Relative: 7 %
Neutro Abs: 5.6 10*3/uL (ref 1.7–7.7)
Neutrophils Relative %: 74 %
Platelet Count: 178 10*3/uL (ref 150–400)
RBC: 4.41 MIL/uL (ref 4.22–5.81)
RDW: 13.2 % (ref 11.5–15.5)
WBC Count: 7.6 10*3/uL (ref 4.0–10.5)
nRBC: 0 % (ref 0.0–0.2)

## 2019-08-30 MED ORDER — SODIUM CHLORIDE 0.9 % IV SOLN
Freq: Once | INTRAVENOUS | Status: AC
  Administered 2019-08-30: 11:00:00 via INTRAVENOUS
  Filled 2019-08-30: qty 250

## 2019-08-30 MED ORDER — SODIUM CHLORIDE 0.9 % IV SOLN
480.0000 mg | Freq: Once | INTRAVENOUS | Status: AC
  Administered 2019-08-30: 480 mg via INTRAVENOUS
  Filled 2019-08-30: qty 48

## 2019-08-30 NOTE — Progress Notes (Signed)
[place] [phone]   [phone]  OFFICE PROGRESS NOTE  [name], MD 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  PRIOR THERAPY:  1) Stereotactic radiotherapy to a solitary brain metastasis under the care of Dr. Lisbeth Renshaw on 01/25/2017. 2) Short course of concurrent chemoradiation with weekly carboplatin for AUC of 2 and paclitaxel 45 MG/M2 to the locally advanced disease in the chest. Status post 3 cycles. 3) Systemic chemotherapy with carboplatin for AUC of 5, Alimta 500 MG/M2 and Avastin 15 MG/KG every 3 weeks. First dose of 02/15/2017. Status post 6 cycles. Last dose 06/08/2017 with stable disease. 4) whole brain irradiation under the care of Dr. Lisbeth Renshaw expected to complete on October 14, 2018. 5) Maintenance systemic chemotherapy with Alimta 500 MG/M2 in addition to Avastin 15 MG/KG every 3 weeks. First dose 07/13/2017.  Status post 20 cycles.  Starting from cycle #16 he will be treated with single agent Alimta only secondary to intolerance.   CURRENT THERAPY: Second line treatment with immunotherapy with nivolumab 480 mg IV every 4 weeks.  First dose October 26, 2018.  Status post 11 cycles.  INTERVAL HISTORY: Calvin Wagner 53 y.o. male returns to the clinic today for follow-up visit.  The patient is feeling fine today with no concerning complaints except for occasional dizzy spells.  He denied having any current chest pain, shortness of breath, cough or hemoptysis.  He denied having any fever or chills.  He has no nausea, vomiting, diarrhea or constipation.  He has no headache or visual changes.  He denied having any recent  weight loss or night sweats.  He had repeat CT scan of the chest, abdomen pelvis performed recently and he is here today for evaluation and discussion of his discuss results.  MEDICAL HISTORY: Past Medical History:  Diagnosis Date   Encounter for antineoplastic chemotherapy 12/31/2016   GERD 11/08/2009   Qualifier: Diagnosis of  By: Ronnald Ramp MD, Arvid Right.    Goals of care, counseling/discussion 12/31/2016   History of radiation therapy 01/13/2017 - 02/02/2017   Site/dose:  Left lung: 30 Gy in 15 fractions   HYPERTENSION 11/08/2009   Qualifier: Diagnosis of  By: Ronnald Ramp MD, Arvid Right.    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  MEDICATIONS:  Current Outpatient Medications  Medication Sig Dispense Refill   albuterol (PROVENTIL HFA;VENTOLIN HFA) 108 (90 Base) MCG/ACT inhaler Inhale 2 puffs into the lungs every 4 (four) hours as needed.     amLODipine (NORVASC) 5 MG tablet Take 5 mg by mouth daily.     budesonide (RA BUDESONIDE) 32 MCG/ACT nasal spray Place into both nostrils daily.     BUDESONIDE PO Take by mouth. Budesonide slurry     budesonide-formoterol (SYMBICORT) 80-4.5 MCG/ACT inhaler Inhale 2 puffs into the lungs 2 (two) times daily. 1 Inhaler 5   chlorpheniramine-HYDROcodone (TUSSIONEX) 10-8 MG/5ML SUER Take 5 mLs by mouth every 12 (twelve) hours as needed for cough. 240 mL 0   dexamethasone (DECADRON) 1  MG tablet Take 1 tablet (1 mg total) by mouth daily. 10 tablet 0   famotidine (PEPCID) 20 MG tablet Take 20 mg by mouth 2 (two) times daily.     folic acid (FOLVITE) 1 MG tablet Take 1 tablet (1 mg total) by mouth daily. 30 tablet 4   furosemide (LASIX) 20 MG tablet Take 1 tablet (20 mg total) by mouth daily as needed. (Patient not taking: Reported on 08/14/2019) 20 tablet 0   LORazepam (ATIVAN) 1 MG  tablet TAKE 1-1 AND 1/2 TABSEVERY 4 HOURS AS NEEDED FOR ANXIETY (30 MINUTES PRIOR TO RADIOTHERAPY OR MRI SCANS, AND NAUSEA). (Patient not taking: Reported on 08/02/2019) 90 tablet 1   losartan (COZAAR) 50 MG tablet Take 50 mg by mouth daily.     magic mouthwash SOLN Take 5 mLs by mouth 4 (four) times daily as needed for mouth pain. 1:1:1 mix-Nystatin. Benadryl and extra strength maalox. 240 mL 0   Multiple Vitamin (MULTIVITAMIN WITH MINERALS) TABS tablet Take 1 tablet by mouth daily.     ondansetron (ZOFRAN) 8 MG tablet TAKE 1 TABLET BY MOUTH EVERY 8 HOURS AS NEEDED FOR NAUSEA OR VOMITING. (Patient not taking: Reported on 08/14/2019) 90 tablet 0   pantoprazole (PROTONIX) 40 MG tablet Take 40 mg by mouth daily.  2   PROAIR HFA 108 (90 Base) MCG/ACT inhaler Inhale 2 puffs into the lungs every 4 (four) hours as needed.  5   prochlorperazine (COMPAZINE) 10 MG tablet TAKE 1 TABLET BY MOUTH EVERY 6 HOURS AS NEEDED FOR NAUSEA OR VOMITING (Patient not taking: Reported on 08/02/2019) 90 tablet 0   traMADol (ULTRAM) 50 MG tablet TAKE 1 TABLET BY MOUTH EVERY 12 HOURS AS NEEDED (Patient not taking: Reported on 04/10/2019) 30 tablet 0   XARELTO 20 MG TABS tablet TAKE 1 TABLET BY MOUTH EVERY DAY WITH SUPPER 90 tablet 0   No current facility-administered medications for this visit.     SURGICAL HISTORY:  Past Surgical History:  Procedure Laterality Date   INGUINAL HERNIA REPAIR     SHOULDER ARTHROSCOPY WITH ROTATOR CUFF REPAIR Left 11/12/2016   Procedure: SHOULDER ARTHROSCOPY WITH ROTATOR CUFF REPAIR AND SUBACROMIAL DECOMPRESSION;  Surgeon: Tania Ade, MD;  Location: [facility];  Service: Orthopedics;  Laterality: Left;  SHOULDER ARTHROSCOPY WITH ROTATOR CUFF REPAIR AND SUBACROMIAL DECOMPRESSION   TONSILLECTOMY     TOTAL KNEE ARTHROPLASTY     VIDEO BRONCHOSCOPY Bilateral 12/10/2016   Procedure: VIDEO BRONCHOSCOPY WITHOUT FLUORO;  Surgeon: Tanda Rockers, MD;  Location: [facility];  Service:  Cardiopulmonary;  Laterality: Bilateral;    REVIEW OF SYSTEMS:  Constitutional: negative Eyes: negative Ears, nose, mouth, throat, and face: negative Respiratory: negative Cardiovascular: negative Gastrointestinal: negative Genitourinary:negative Integument/breast: negative Hematologic/lymphatic: negative Musculoskeletal:negative Neurological: positive for dizziness Behavioral/Psych: negative Endocrine: negative Allergic/Immunologic: negative   PHYSICAL EXAMINATION: General appearance: alert, cooperative and no distress Head: Normocephalic, without obvious abnormality, atraumatic Neck: no adenopathy, no JVD, supple, symmetrical, trachea midline and thyroid not enlarged, symmetric, no tenderness/mass/nodules Lymph nodes: Cervical, supraclavicular, and axillary nodes normal. Resp: clear to auscultation bilaterally Back: symmetric, no curvature. ROM normal. No CVA tenderness. Cardio: regular rate and rhythm, S1, S2 normal, no murmur, click, rub or gallop GI: soft, non-tender; bowel sounds normal; no masses,  no organomegaly Extremities: extremities normal, atraumatic, no cyanosis or edema Neurologic: Alert and oriented X 3, normal strength and tone. Normal symmetric reflexes. Normal coordination and gait  ECOG PERFORMANCE STATUS: 1 - Symptomatic but completely ambulatory  Blood pressure (!) 135/97,  pulse 98, temperature 98.2 F (36.8 C), temperature source Oral, resp. rate 20, height 6\' 1"  (1.854 m), weight 245 lb (111.1 kg), SpO2 99 %.  LABORATORY DATA: Lab Results  Component Value Date   WBC 7.6 08/30/2019   HGB 15.6 08/30/2019   HCT 45.4 08/30/2019   MCV 102.9 (H) 08/30/2019   PLT 178 08/30/2019      Chemistry      Component Value Date/Time   NA 139 08/28/2019 1141   NA 138 10/08/2017 0907   K 4.1 08/28/2019 1141   K 4.1 10/08/2017 0907   CL 99 08/28/2019 1141   CO2 29 08/28/2019 1141   CO2 24 10/08/2017 0907   BUN 21 (H) 08/28/2019 1141   BUN 11.7 10/08/2017  0907   CREATININE 1.26 (H) 08/28/2019 1141   CREATININE 1.2 10/08/2017 0907      Component Value Date/Time   CALCIUM 9.0 08/28/2019 1141   CALCIUM 9.5 10/08/2017 0907   ALKPHOS 78 08/28/2019 1141   ALKPHOS 88 10/08/2017 0907   AST 21 08/28/2019 1141   AST 22 10/08/2017 0907   ALT 46 (H) 08/28/2019 1141   ALT 24 10/08/2017 0907   BILITOT 0.6 08/28/2019 1141   BILITOT 0.42 10/08/2017 0907       RADIOGRAPHIC STUDIES: Ct Chest W Contrast  Result Date: 08/28/2019 CLINICAL DATA:  Restaging lung cancer EXAM: CT CHEST, ABDOMEN, AND PELVIS WITH CONTRAST TECHNIQUE: Multidetector CT imaging of the chest, abdomen and pelvis was performed following the standard protocol during bolus administration of intravenous contrast. CONTRAST:  162mL OMNIPAQUE IOHEXOL 300 MG/ML  SOLN COMPARISON:  06/06/2019 FINDINGS: CT CHEST FINDINGS Cardiovascular: Heart size appears within normal limits. No pericardial effusion identified. Mediastinum/Nodes: Normal appearance of the thyroid gland. The trachea appears patent and is midline. Normal appearance of the esophagus. Left hilar lesion measures 1.4 x 2.0 by 1.9 cm. As on the previous exam there is involvement of the left inferior pulmonary vein, image 94/4. On the prior study this measured the same. Lungs/Pleura: Perihilar ground-glass attenuation and fibrosis within the left lower lobe is unchanged. No suspicious pulmonary nodule or mass. Musculoskeletal: No aggressive lytic or sclerotic bone lesions. CT ABDOMEN PELVIS FINDINGS Hepatobiliary: Unchanged small focus of low density within segment 5 compared with previous exam. Favor focal fatty deposition. No suspicious liver abnormality noted at this time. Gallbladder is unremarkable. No biliary ductal dilatation. Pancreas: Unremarkable. No pancreatic ductal dilatation or surrounding inflammatory changes. Spleen: Normal in size without focal abnormality. Adrenals/Urinary Tract: Normal right adrenal gland. There is a nodule in  the left adrenal gland measuring 1.0 x 1.7 cm. Previously 0.9 x 0.5 cm. There are small exophytic foci arising from the inferior pole of right kidney and lateral cortex of the inferior pole of left kidney. These are too small to characterize measuring up to 8 mm. No hydronephrosis identified bilaterally. Urinary bladder is normal. Stomach/Bowel: Stomach is within normal limits. Appendix appears normal. No evidence of bowel wall thickening, distention, or inflammatory changes. Vascular/Lymphatic: Aortic atherosclerosis. No aneurysm. No abdominopelvic adenopathy. Reproductive: Prostate is unremarkable. Other: Previous bilateral inguinal herniorrhaphy. Musculoskeletal: No acute or significant osseous findings. IMPRESSION: 1. Gradual increase in size of left hilar lymph node/ soft tissue nodule which is involving the left inferior pulmonary vein. This is new since 07/22/2018. Consider further evaluation with PET-CT. 2. Also new since 07/22/2018 there is a gradually enlarging nodule in the left adrenal gland, suspicious for a focus of metastatic disease. This could also be better assessed with PET-CT.  Aortic Atherosclerosis (ICD10-I70.0). Electronically Signed   By: Kerby Moors M.D.   On: 08/28/2019 20:15   Mr Jeri Cos ZC Contrast  Result Date: 08/07/2019 CLINICAL DATA:  Metastatic lung cancer. Prior brain radiation 2018. Whole-brain radiation January 2020. EXAM: MRI HEAD WITHOUT AND WITH CONTRAST TECHNIQUE: Multiplanar, multiecho pulse sequences of the brain and surrounding structures were obtained without and with intravenous contrast. CONTRAST:  63mL MULTIHANCE GADOBENATE DIMEGLUMINE 529 MG/ML IV SOLN COMPARISON:  MRI head 04/27/2019, 01/12/2019 FINDINGS: Brain: Marked progression of edema in the left frontal lobe. Increased mass-effect now with approximately 5 mm of subfalcine herniation. Marked progression of enhancement in the left frontal lobe anteriorly at the site of previous tumor. The area of  enhancement shows necrosis and mild hemorrhage and now measures 43 x 23 mm on axial images. This extends down to the gyrus rectus. 2 mm enhancing lesion with slight edema has developed in the left posterior frontal operculum axial image 82. Multiple metastatic deposits have been treated previously and have resolved. Negative for hydrocephalus.  Negative for acute infarct. Vascular: Normal arterial flow voids. Skull and upper cervical spine: Negative Sinuses/Orbits: Moderate mucosal edema paranasal sinuses. Negative orbit Other: None IMPRESSION: Marked progression of edema and abnormal enhancement in the left frontal lobe compared with 04/27/2019. Increased mass-effect with midline shift. 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 New 2 mm enhancing lesion left posterior frontal operculum consistent with metastatic disease. Electronically Signed   By: Franchot Gallo M.D.   On: 08/07/2019 17:41   Ct Abdomen Pelvis W Contrast  Result Date: 08/28/2019 CLINICAL DATA:  Restaging lung cancer EXAM: CT CHEST, ABDOMEN, AND PELVIS WITH CONTRAST TECHNIQUE: Multidetector CT imaging of the chest, abdomen and pelvis was performed following the standard protocol during bolus administration of intravenous contrast. CONTRAST:  11mL OMNIPAQUE IOHEXOL 300 MG/ML  SOLN COMPARISON:  06/06/2019 FINDINGS: CT CHEST FINDINGS Cardiovascular: Heart size appears within normal limits. No pericardial effusion identified. Mediastinum/Nodes: Normal appearance of the thyroid gland. The trachea appears patent and is midline. Normal appearance of the esophagus. Left hilar lesion measures 1.4 x 2.0 by 1.9 cm. As on the previous exam there is involvement of the left inferior pulmonary vein, image 94/4. On the prior study this measured the same. Lungs/Pleura: Perihilar ground-glass attenuation and fibrosis within the left lower lobe is unchanged. No suspicious pulmonary nodule or mass. Musculoskeletal: No aggressive lytic or sclerotic bone  lesions. CT ABDOMEN PELVIS FINDINGS Hepatobiliary: Unchanged small focus of low density within segment 5 compared with previous exam. Favor focal fatty deposition. No suspicious liver abnormality noted at this time. Gallbladder is unremarkable. No biliary ductal dilatation. Pancreas: Unremarkable. No pancreatic ductal dilatation or surrounding inflammatory changes. Spleen: Normal in size without focal abnormality. Adrenals/Urinary Tract: Normal right adrenal gland. There is a nodule in the left adrenal gland measuring 1.0 x 1.7 cm. Previously 0.9 x 0.5 cm. There are small exophytic foci arising from the inferior pole of right kidney and lateral cortex of the inferior pole of left kidney. These are too small to characterize measuring up to 8 mm. No hydronephrosis identified bilaterally. Urinary bladder is normal. Stomach/Bowel: Stomach is within normal limits. Appendix appears normal. No evidence of bowel wall thickening, distention, or inflammatory changes. Vascular/Lymphatic: Aortic atherosclerosis. No aneurysm. No abdominopelvic adenopathy. Reproductive: Prostate is unremarkable. Other: Previous bilateral inguinal herniorrhaphy. Musculoskeletal: No acute or significant osseous findings. IMPRESSION: 1. Gradual increase in size of left hilar lymph node/ soft tissue nodule which is involving the left inferior pulmonary vein. This  is new since 07/22/2018. Consider further evaluation with PET-CT. 2. Also new since 07/22/2018 there is a gradually enlarging nodule in the left adrenal gland, suspicious for a focus of metastatic disease. This could also be better assessed with PET-CT. Aortic Atherosclerosis (ICD10-I70.0). Electronically Signed   By: Kerby Moors M.D.   On: 08/28/2019 20:15    ASSESSMENT AND PLAN: This is a very pleasant 53 years old white male with stage IV non-small cell lung cancer, high-grade neuroendocrine carcinoma presented with locally advanced disease in the chest as well as solitary brain  metastasis and 2 liver lesions. The patient is status post stereotactic radiotherapy to the solitary brain metastasis as well as short course of concurrent chemoradiation to the locally advanced disease in the chest. He recently completed 6 cycles of systemic chemotherapy with carboplatin, Alimta and Avastin and tolerated this treatment fairly well. He was started on maintenance treatment with Alimta and Avastin status post 15 cycles.  Avastin was discontinued secondary to intolerance to the combination treatment.  He is currently on treatment with single agent Alimta status post 5 cycles. He has been tolerating his treatment well. Unfortunately he was recently found to have multiple metastatic brain lesion and the patient underwent whole brain irradiation. His scan showed evidence for disease progression with enlargement of left lower lobe lung nodule in addition to hilar lymphadenopathy as well as progression of the liver lesion. He was started on treatment with nivolumab 480 mg IV every 4 weeks status post 11 cycles.  The patient has been tolerating this treatment well with no concerning adverse effects. He had repeat CT scan of the chest, abdomen pelvis performed recently.  I personally and independently reviewed the scan and discussed the result and showed the images to the patient today. Compared to his last scan in August 2020 there was no significant evidence for disease progression and the nodules mentioned in the scan has been there for several months. I recommended for the patient to continue her current treatment with nivolumab and he will proceed with cycle #12 today. For the dizzy spells, this is secondary to his disease progression in the brain and he is currently followed by Dr. Mickeal Skinner with a tapered dose of Decadron. I will see him back for follow-up visit in 4 weeks for evaluation before the next cycle of his treatment. He was advised to call immediately if he has any concerning  symptoms in the interval. The patient voices understanding of current disease status and treatment options and is in agreement with the current care plan. All questions were answered. The patient knows to call the clinic with any problems, questions or concerns. We can certainly see the patient much sooner if necessary.  Disclaimer: This note was dictated with voice recognition software. Similar sounding words can inadvertently be transcribed and may not be corrected upon review.

## 2019-08-30 NOTE — Patient Instructions (Signed)
[facility] Discharge Instructions for Patients Receiving Chemotherapy  Today you received the following chemotherapy agents Opdivo  To help prevent nausea and vomiting after your treatment, we encourage you to take your nausea medication as directed   If you develop nausea and vomiting that is not controlled by your nausea medication, call the clinic.   BELOW ARE SYMPTOMS THAT SHOULD BE REPORTED IMMEDIATELY:  *FEVER GREATER THAN 100.5 F  *CHILLS WITH OR WITHOUT FEVER  NAUSEA AND VOMITING THAT IS NOT CONTROLLED WITH YOUR NAUSEA MEDICATION  *UNUSUAL SHORTNESS OF BREATH  *UNUSUAL BRUISING OR BLEEDING  TENDERNESS IN MOUTH AND THROAT WITH OR WITHOUT PRESENCE OF ULCERS  *URINARY PROBLEMS  *BOWEL PROBLEMS  UNUSUAL RASH Items with * indicate a potential emergency and should be followed up as soon as possible.  Feel free to call the clinic should you have any questions or concerns. The clinic phone number is [phone].  Please show the CHEMO ALERT CARD at check-in to the Emergency Department and triage nurse.   

## 2019-08-31 ENCOUNTER — Telehealth: Payer: Self-pay | Admitting: Internal Medicine

## 2019-08-31 NOTE — Telephone Encounter (Signed)
Scheduled per los. Called and left msg. Mailed printout  °

## 2019-09-20 ENCOUNTER — Encounter: Payer: Self-pay | Admitting: *Deleted

## 2019-09-25 ENCOUNTER — Telehealth: Payer: Self-pay | Admitting: Internal Medicine

## 2019-09-25 NOTE — Telephone Encounter (Signed)
Called pt per 12/14 sch message - no answer - left message for patient to call back to reschedule appt

## 2019-09-27 ENCOUNTER — Ambulatory Visit: Payer: 59 | Admitting: Internal Medicine

## 2019-09-27 ENCOUNTER — Other Ambulatory Visit: Payer: Self-pay | Admitting: Internal Medicine

## 2019-09-27 ENCOUNTER — Other Ambulatory Visit: Payer: 59

## 2019-09-27 ENCOUNTER — Ambulatory Visit: Payer: 59

## 2019-09-27 DIAGNOSIS — Z23 Encounter for immunization: Secondary | ICD-10-CM

## 2019-09-27 DIAGNOSIS — C7931 Secondary malignant neoplasm of brain: Secondary | ICD-10-CM

## 2019-09-29 ENCOUNTER — Inpatient Hospital Stay: Payer: Medicare Other | Attending: Internal Medicine

## 2019-09-29 ENCOUNTER — Other Ambulatory Visit: Payer: Self-pay | Admitting: *Deleted

## 2019-09-29 ENCOUNTER — Inpatient Hospital Stay (HOSPITAL_BASED_OUTPATIENT_CLINIC_OR_DEPARTMENT_OTHER): Payer: Medicare Other | Admitting: Physician Assistant

## 2019-09-29 ENCOUNTER — Other Ambulatory Visit: Payer: Self-pay

## 2019-09-29 ENCOUNTER — Inpatient Hospital Stay: Payer: Medicare Other

## 2019-09-29 VITALS — BP 141/96 | HR 120 | Temp 98.5°F | Resp 20 | Ht 73.0 in | Wt 248.0 lb

## 2019-09-29 VITALS — HR 99

## 2019-09-29 DIAGNOSIS — C7931 Secondary malignant neoplasm of brain: Secondary | ICD-10-CM | POA: Diagnosis present

## 2019-09-29 DIAGNOSIS — Z5112 Encounter for antineoplastic immunotherapy: Secondary | ICD-10-CM | POA: Insufficient documentation

## 2019-09-29 DIAGNOSIS — Z79899 Other long term (current) drug therapy: Secondary | ICD-10-CM | POA: Diagnosis not present

## 2019-09-29 DIAGNOSIS — R5383 Other fatigue: Secondary | ICD-10-CM | POA: Diagnosis not present

## 2019-09-29 DIAGNOSIS — C3492 Malignant neoplasm of unspecified part of left bronchus or lung: Secondary | ICD-10-CM

## 2019-09-29 DIAGNOSIS — K219 Gastro-esophageal reflux disease without esophagitis: Secondary | ICD-10-CM | POA: Insufficient documentation

## 2019-09-29 DIAGNOSIS — C787 Secondary malignant neoplasm of liver and intrahepatic bile duct: Secondary | ICD-10-CM | POA: Insufficient documentation

## 2019-09-29 DIAGNOSIS — I10 Essential (primary) hypertension: Secondary | ICD-10-CM | POA: Diagnosis not present

## 2019-09-29 DIAGNOSIS — C3432 Malignant neoplasm of lower lobe, left bronchus or lung: Secondary | ICD-10-CM | POA: Insufficient documentation

## 2019-09-29 DIAGNOSIS — Z7901 Long term (current) use of anticoagulants: Secondary | ICD-10-CM | POA: Insufficient documentation

## 2019-09-29 DIAGNOSIS — Z923 Personal history of irradiation: Secondary | ICD-10-CM | POA: Diagnosis not present

## 2019-09-29 DIAGNOSIS — R110 Nausea: Secondary | ICD-10-CM | POA: Diagnosis not present

## 2019-09-29 DIAGNOSIS — R0602 Shortness of breath: Secondary | ICD-10-CM | POA: Insufficient documentation

## 2019-09-29 DIAGNOSIS — Z8249 Family history of ischemic heart disease and other diseases of the circulatory system: Secondary | ICD-10-CM | POA: Insufficient documentation

## 2019-09-29 DIAGNOSIS — K1379 Other lesions of oral mucosa: Secondary | ICD-10-CM

## 2019-09-29 DIAGNOSIS — Z7951 Long term (current) use of inhaled steroids: Secondary | ICD-10-CM | POA: Diagnosis not present

## 2019-09-29 LAB — CMP (CANCER CENTER ONLY)
ALT: 19 U/L (ref 0–44)
AST: 14 U/L — ABNORMAL LOW (ref 15–41)
Albumin: 3.5 g/dL (ref 3.5–5.0)
Alkaline Phosphatase: 93 U/L (ref 38–126)
Anion gap: 11 (ref 5–15)
BUN: 13 mg/dL (ref 6–20)
CO2: 25 mmol/L (ref 22–32)
Calcium: 8.8 mg/dL — ABNORMAL LOW (ref 8.9–10.3)
Chloride: 104 mmol/L (ref 98–111)
Creatinine: 1.28 mg/dL — ABNORMAL HIGH (ref 0.61–1.24)
GFR, Est AFR Am: >60 mL/min (ref >60)
GFR, Estimated: >60 mL/min (ref >60)
Glucose, Bld: 184 mg/dL — ABNORMAL HIGH (ref 70–99)
Potassium: 3.9 mmol/L (ref 3.5–5.1)
Sodium: 140 mmol/L (ref 135–145)
Total Bilirubin: 0.5 mg/dL (ref 0.3–1.2)
Total Protein: 6.7 g/dL (ref 6.5–8.1)

## 2019-09-29 LAB — CBC WITH DIFFERENTIAL (CANCER CENTER ONLY)
Abs Immature Granulocytes: 0.01 10*3/uL (ref 0.00–0.07)
Basophils Absolute: 0 10*3/uL (ref 0.0–0.1)
Basophils Relative: 1 %
Eosinophils Absolute: 0.1 10*3/uL (ref 0.0–0.5)
Eosinophils Relative: 2 %
HCT: 41.9 % (ref 39.0–52.0)
Hemoglobin: 14.3 g/dL (ref 13.0–17.0)
Immature Granulocytes: 0 %
Lymphocytes Relative: 23 %
Lymphs Abs: 1 10*3/uL (ref 0.7–4.0)
MCH: 35.8 pg — ABNORMAL HIGH (ref 26.0–34.0)
MCHC: 34.1 g/dL (ref 30.0–36.0)
MCV: 104.8 fL — ABNORMAL HIGH (ref 80.0–100.0)
Monocytes Absolute: 0.4 10*3/uL (ref 0.1–1.0)
Monocytes Relative: 9 %
Neutro Abs: 2.8 10*3/uL (ref 1.7–7.7)
Neutrophils Relative %: 65 %
Platelet Count: 218 10*3/uL (ref 150–400)
RBC: 4 MIL/uL — ABNORMAL LOW (ref 4.22–5.81)
RDW: 13.2 % (ref 11.5–15.5)
WBC Count: 4.3 10*3/uL (ref 4.0–10.5)
nRBC: 0 % (ref 0.0–0.2)

## 2019-09-29 MED ORDER — MAGIC MOUTHWASH: 5.0000 mL | Freq: Four times a day (QID) | ORAL | 0 refills | Status: DC | PRN

## 2019-09-29 MED ORDER — SODIUM CHLORIDE 0.9 % IV SOLN
Freq: Once | INTRAVENOUS | Status: AC
  Filled 2019-09-29: qty 250

## 2019-09-29 MED ORDER — SODIUM CHLORIDE 0.9 % IV SOLN
480.0000 mg | Freq: Once | INTRAVENOUS | Status: AC
  Administered 2019-09-29: 480 mg via INTRAVENOUS
  Filled 2019-09-29: qty 48

## 2019-09-29 NOTE — Progress Notes (Signed)
[facility] OFFICE PROGRESS NOTE  [name], MD 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  PRIOR THERAPY: 1) Stereotactic radiotherapy to a solitary brain metastasis under the care of Dr. Lisbeth Renshaw on 01/25/2017. 2) Short course of concurrent chemoradiation with weekly carboplatin for AUC of 2 and paclitaxel 45 MG/M2 to the locally advanced disease in the chest. Status post 3 cycles. 3) Systemic chemotherapy with carboplatin for AUC of 5, Alimta 500 MG/M2 and Avastin 15 MG/KG every 3 weeks. First dose of 02/15/2017. Status post 6 cycles. Last dose 06/08/2017 with stable disease. 4) whole brain irradiation under the care of Dr. Lisbeth Renshaw expected to complete on October 14, 2018. 5) Maintenance systemic chemotherapy with Alimta 500 MG/M2 in addition to Avastin 15 MG/KG every 3 weeks. First dose 07/13/2017.  Status post 20 cycles.  Starting from cycle #16 he will be treated with single agent Alimta only secondary to intolerance.  CURRENT THERAPY: Second line treatment with immunotherapy with nivolumab 480 mg IV every 4 weeks.  First dose October 26, 2018.  Status post 12 cycles.  INTERVAL HISTORY: Calvin Wagner 53 y.o. male returns to the clinic for a follow up visit. The patient is feeling well today without any concerning complaints. The patient continues to tolerate treatment with nivolumab well without any adverse side effects. Denies any fever, chills, night sweats, or weight loss. Denies any chest pain, cough, or hemoptysis. He reports his baseline shortness of breath with exertion. Denies any vomiting, diarrhea, or constipation. He occasionally experiences nausea for  which he takes his prescribed anti-emetic. Denies any headache or visual changes. He is followed closely by radiation oncology and neuro oncologist Dr. Mickeal Skinner for his history of brain metastases. He is scheduled for a repeat brain MRI on 10/09/2019. Per patient report, he recently completed his decadron taper. Denies any rashes or skin changes. The patient is here today for evaluation prior to starting cycle # 13   MEDICAL HISTORY: Past Medical History:  Diagnosis Date  . Encounter for antineoplastic chemotherapy 12/31/2016  . GERD 11/08/2009   Qualifier: Diagnosis of  By: Ronnald Ramp MD, Arvid Right Goals of care, counseling/discussion 12/31/2016  . History of radiation therapy 01/13/2017 - 02/02/2017   Site/dose:  Left lung: 30 Gy in 15 fractions  . HYPERTENSION 11/08/2009   Qualifier: Diagnosis of  By: Ronnald Ramp MD, Arvid Right.   . 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 Migraine 02/25/2012  . Pneumonia   . PONV (postoperative nausea and vomiting)   . Rotator cuff tear, left     ALLERGIES:  has No Known Allergies.  MEDICATIONS:  Current Outpatient Medications  Medication Sig Dispense Refill  . albuterol (PROVENTIL HFA;VENTOLIN HFA) 108 (90 Base) MCG/ACT inhaler Inhale 2 puffs into the lungs every 4 (four) hours as needed.    Marland Kitchen amLODipine (NORVASC) 5 MG tablet Take 5 mg by mouth daily.    . budesonide (RA BUDESONIDE) 32 MCG/ACT nasal spray Place into both nostrils daily.    . BUDESONIDE PO Take by mouth. Budesonide slurry    . budesonide-formoterol (SYMBICORT) 80-4.5 MCG/ACT inhaler Inhale 2 puffs into the lungs 2 (two) times daily. 1 Inhaler 5  . chlorpheniramine-HYDROcodone (  TUSSIONEX) 10-8 MG/5ML SUER Take 5 mLs by mouth every 12 (twelve) hours as needed for cough. 240 mL 0  . dexamethasone (DECADRON) 1 MG tablet Take 1 tablet (1 mg total) by mouth daily. 10 tablet 0  . famotidine (PEPCID) 20 MG tablet Take 20 mg by mouth 2 (two) times daily.    . folic acid (FOLVITE) 1  MG tablet Take 1 tablet (1 mg total) by mouth daily. 30 tablet 4  . furosemide (LASIX) 20 MG tablet Take 1 tablet (20 mg total) by mouth daily as needed. (Patient not taking: Reported on 08/14/2019) 20 tablet 0  . LORazepam (ATIVAN) 1 MG tablet TAKE 1-1 AND 1/2 TABSEVERY 4 HOURS AS NEEDED FOR ANXIETY (30 MINUTES PRIOR TO RADIOTHERAPY OR MRI SCANS, AND NAUSEA). (Patient not taking: Reported on 08/02/2019) 90 tablet 1  . losartan (COZAAR) 50 MG tablet Take 50 mg by mouth daily.    . magic mouthwash SOLN Take 5 mLs by mouth 4 (four) times daily as needed for mouth pain. 1:1:1 mix-Nystatin. Benadryl and extra strength maalox. 240 mL 0  . Multiple Vitamin (MULTIVITAMIN WITH MINERALS) TABS tablet Take 1 tablet by mouth daily.    . ondansetron (ZOFRAN) 8 MG tablet TAKE 1 TABLET BY MOUTH EVERY 8 HOURS AS NEEDED FOR NAUSEA OR VOMITING. (Patient not taking: Reported on 08/14/2019) 90 tablet 0  . pantoprazole (PROTONIX) 40 MG tablet Take 40 mg by mouth daily.  2  . PROAIR HFA 108 (90 Base) MCG/ACT inhaler Inhale 2 puffs into the lungs every 4 (four) hours as needed.  5  . prochlorperazine (COMPAZINE) 10 MG tablet TAKE 1 TABLET BY MOUTH EVERY 6 HOURS AS NEEDED FOR NAUSEA OR VOMITING (Patient not taking: Reported on 08/02/2019) 90 tablet 0  . traMADol (ULTRAM) 50 MG tablet TAKE 1 TABLET BY MOUTH EVERY 12 HOURS AS NEEDED (Patient not taking: Reported on 04/10/2019) 30 tablet 0  . XARELTO 20 MG TABS tablet TAKE 1 TABLET BY MOUTH EVERY DAY WITH SUPPER 90 tablet 0   No current facility-administered medications for this visit.    SURGICAL HISTORY:  Past Surgical History:  Procedure Laterality Date  . INGUINAL HERNIA REPAIR    . SHOULDER ARTHROSCOPY WITH ROTATOR CUFF REPAIR Left 11/12/2016   Procedure: SHOULDER ARTHROSCOPY WITH ROTATOR CUFF REPAIR AND SUBACROMIAL DECOMPRESSION;  Surgeon: Tania Ade, MD;  Location: [facility];  Service: Orthopedics;  Laterality: Left;  SHOULDER ARTHROSCOPY WITH ROTATOR CUFF REPAIR AND  SUBACROMIAL DECOMPRESSION  . TONSILLECTOMY    . TOTAL KNEE ARTHROPLASTY    . VIDEO BRONCHOSCOPY Bilateral 12/10/2016   Procedure: VIDEO BRONCHOSCOPY WITHOUT FLUORO;  Surgeon: Tanda Rockers, MD;  Location: [facility];  Service: Cardiopulmonary;  Laterality: Bilateral;    REVIEW OF SYSTEMS:   Review of Systems  Constitutional: Negative for appetite change, chills, fatigue, fever and unexpected weight change.  HENT:  Negative for mouth sores, nosebleeds, sore throat and trouble swallowing.   Eyes: Negative for eye problems and icterus.  Respiratory: Positive for baseline shortness of breath with exertion. Negative for cough, hemoptysis, and wheezing.  Cardiovascular: Negative for chest pain and leg swelling.  Gastrointestinal: Positive for occasional nausea. Negative for abdominal pain, constipation, diarrhea, and vomiting.  Genitourinary: Negative for bladder incontinence, difficulty urinating, dysuria, frequency and hematuria.   Musculoskeletal: Negative for back pain, gait problem, neck pain and neck stiffness.  Skin: Negative for itching and rash.  Neurological: Negative for dizziness, extremity weakness, gait problem, headaches, light-headedness and seizures.  Hematological: Negative for adenopathy.  Does not bruise/bleed easily.  Psychiatric/Behavioral: Negative for confusion, depression and sleep disturbance. The patient is not nervous/anxious.     PHYSICAL EXAMINATION:  There were no vitals taken for this visit.  ECOG PERFORMANCE STATUS: 1 - Symptomatic but completely ambulatory  Physical Exam  Constitutional: Oriented to person, place, and time and well-developed, well-nourished, and in no distress.  HENT:  Head: Normocephalic and atraumatic.  Mouth/Throat: Oropharynx is clear and moist. No oropharyngeal exudate.  Eyes: Conjunctivae are normal. Right eye exhibits no discharge. Left eye exhibits no discharge. No scleral icterus.  Neck: Normal range of motion. Neck supple.   Cardiovascular: Tachycardic, regular rhythm, normal heart sounds and intact distal pulses.  Pulmonary/Chest: Effort normal and breath sounds normal. No respiratory distress. No wheezes. No rales.  Abdominal: Soft.Epigastric hernia noted.Bowel sounds are normal. Exhibits no distension and no mass. There is no tenderness.  Musculoskeletal: Normal range of motion. Exhibits no edema.  Lymphadenopathy:  No cervical adenopathy.  Neurological: Alert and oriented to person, place, and time. Exhibits normal muscle tone. Gait normal. Coordination normal.  Skin: Skin is warm and dry. No rash noted. Not diaphoretic. No erythema. No pallor.  Psychiatric: Mood, memory and judgment normal.  Vitals reviewed.  LABORATORY DATA: Lab Results  Component Value Date   WBC 7.6 08/30/2019   HGB 15.6 08/30/2019   HCT 45.4 08/30/2019   MCV 102.9 (H) 08/30/2019   PLT 178 08/30/2019      Chemistry      Component Value Date/Time   NA 140 08/30/2019 0904   NA 138 10/08/2017 0907   K 4.1 08/30/2019 0904   K 4.1 10/08/2017 0907   CL 101 08/30/2019 0904   CO2 27 08/30/2019 0904   CO2 24 10/08/2017 0907   BUN 19 08/30/2019 0904   BUN 11.7 10/08/2017 0907   CREATININE 1.31 (H) 08/30/2019 0904   CREATININE 1.2 10/08/2017 0907      Component Value Date/Time   CALCIUM 9.0 08/30/2019 0904   CALCIUM 9.5 10/08/2017 0907   ALKPHOS 76 08/30/2019 0904   ALKPHOS 88 10/08/2017 0907   AST 12 (L) 08/30/2019 0904   AST 22 10/08/2017 0907   ALT 34 08/30/2019 0904   ALT 24 10/08/2017 0907   BILITOT 0.6 08/30/2019 0904   BILITOT 0.42 10/08/2017 0907       RADIOGRAPHIC STUDIES:  No results found.   ASSESSMENT/PLAN:  This is a very pleasant 53 year old Caucasian male with stage IV non-small cell lung cancer, high-grade neuroendocrine carcinoma. He presented with locally advanced disease in the chest,a solitary brain metastasis,and 2 liver lesions. The patient was diagnosed in March 2018.  The patient  is status post stereotactic radiotherapy to the solitary brain metastasisaswell with a short course of concurrent chemoradiation to the locally advanced disease in the chest.  He then underwent 6 cycles of systemic chemotherapy with carboplatin, Alimta, and Avastin. He tolerated treatment well. He was then started on maintenance treatment with Alimta and Avastin. He is status post 15 cycles. Avastin was discontinued due to intolerance withcombination therapy. He then completed 5 more cycles of single agent Alimta. This was discontinued due to developing multiple metastatic brain lesions and progression of the liver lesion.  He thencompleted whole brain irradiation.  He is currently being treated with nivolumab 480 mg IV every 4 weeks. He is status post12 cycles and is tolerating it well without any adverse effects.  Labs were reviewed with the patient. He will proceed with cycle #13 today as scheduled.  We  will see him back for a follow up visit in 4 weeks for evaluation before starting cycle #14.   He will have his brain MRI on 12/28 as scheduled and will follow with Dr. Mickeal Skinner a few days later.   The patient was advised to call immediately if he has any concerning symptoms in the interval. The patient voices understanding of current disease status and treatment options and is in agreement with the current care plan. All questions were answered. The patient knows to call the clinic with any problems, questions or concerns. We can certainly see the patient much sooner if necessary   No orders of the defined types were placed in this encounter.    Izaia Say L Maanav Kassabian, PA-C 09/29/19

## 2019-09-29 NOTE — Patient Instructions (Signed)
[facility] Discharge Instructions for Patients Receiving Chemotherapy  Today you received the following chemotherapy agents Opdivo  To help prevent nausea and vomiting after your treatment, we encourage you to take your nausea medication as directed   If you develop nausea and vomiting that is not controlled by your nausea medication, call the clinic.   BELOW ARE SYMPTOMS THAT SHOULD BE REPORTED IMMEDIATELY:  *FEVER GREATER THAN 100.5 F  *CHILLS WITH OR WITHOUT FEVER  NAUSEA AND VOMITING THAT IS NOT CONTROLLED WITH YOUR NAUSEA MEDICATION  *UNUSUAL SHORTNESS OF BREATH  *UNUSUAL BRUISING OR BLEEDING  TENDERNESS IN MOUTH AND THROAT WITH OR WITHOUT PRESENCE OF ULCERS  *URINARY PROBLEMS  *BOWEL PROBLEMS  UNUSUAL RASH Items with * indicate a potential emergency and should be followed up as soon as possible.  Feel free to call the clinic should you have any questions or concerns. The clinic phone number is [phone].  Please show the CHEMO ALERT CARD at check-in to the Emergency Department and triage nurse.   

## 2019-10-09 ENCOUNTER — Ambulatory Visit: Payer: 59 | Admitting: Internal Medicine

## 2019-10-09 ENCOUNTER — Other Ambulatory Visit: Payer: Self-pay

## 2019-10-09 ENCOUNTER — Ambulatory Visit (HOSPITAL_COMMUNITY)
Admission: RE | Admit: 2019-10-09 | Discharge: 2019-10-09 | Disposition: A | Discharge status: Home / Self Care | Payer: Medicare Other | Source: Ambulatory Visit | Attending: Internal Medicine | Admitting: Internal Medicine

## 2019-10-09 DIAGNOSIS — C7931 Secondary malignant neoplasm of brain: Secondary | ICD-10-CM | POA: Insufficient documentation

## 2019-10-09 DIAGNOSIS — Z23 Encounter for immunization: Secondary | ICD-10-CM | POA: Diagnosis present

## 2019-10-09 MED ORDER — GADOBUTROL 1 MMOL/ML IV SOLN
10.0000 mL | Freq: Once | INTRAVENOUS | Status: AC | PRN
  Administered 2019-10-09: 10 mL via INTRAVENOUS

## 2019-10-10 ENCOUNTER — Inpatient Hospital Stay: Payer: Medicare Other | Admitting: Internal Medicine

## 2019-10-10 ENCOUNTER — Telehealth: Payer: Self-pay | Admitting: *Deleted

## 2019-10-10 NOTE — Telephone Encounter (Signed)
NO show for visit 10/10/2019 Dr. Mickeal Skinner to review MRI results.  Attempted to reach patient to reschedule, left message, pending call back.

## 2019-10-12 ENCOUNTER — Other Ambulatory Visit: Payer: Self-pay

## 2019-10-12 ENCOUNTER — Inpatient Hospital Stay (HOSPITAL_BASED_OUTPATIENT_CLINIC_OR_DEPARTMENT_OTHER): Payer: Medicare Other | Admitting: Internal Medicine

## 2019-10-12 VITALS — BP 131/91 | HR 116 | Temp 98.9°F | Resp 18 | Ht 73.0 in | Wt 248.4 lb

## 2019-10-12 DIAGNOSIS — C3432 Malignant neoplasm of lower lobe, left bronchus or lung: Secondary | ICD-10-CM | POA: Diagnosis not present

## 2019-10-12 DIAGNOSIS — C7931 Secondary malignant neoplasm of brain: Secondary | ICD-10-CM | POA: Diagnosis not present

## 2019-10-12 NOTE — Progress Notes (Signed)
[facility] [place], [place] [address] [phone]   Interval Evaluation  Date of Service: 10/12/19 Patient Name: Calvin Wagner Patient MRN: [id number] Patient DOB: 1966/10/11 Provider: Ventura Sellers, MD  Identifying Statement:  Calvin Wagner is a 53 y.o. male with Brain metastasis ([place]) [C79.31]   Primary Cancer:  Oncologic History: Oncology History Overview Note  Patient presented with onset of cough then followed with scans.       Cancer ([place]) (Resolved)  11/25/2016 Initial Diagnosis   Cancer ([place])   11/25/2016 Imaging   CTA IMPRESSION: 1. Multiple bilateral acute pulmonary emboli, segmental to subsegmental size. 2. 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 There is complete obstruction of the lingular and left lower lobe airways. 1 cm pulmonary nodule in the left lateral costophrenic sulcus.   11/26/2016 Imaging   CT Abdomen No mass or adenopathy in the abdomen or pelvis   12/10/2016 Surgery   Operation: Video Flexible fiberoptic bronchoscopy, diagnostic    12/17/2016 Imaging   PET IMPRESSION: 1. Extensive left hilar and mediastinal all hypermetabolic lymphadenopathy, compatible with underlying malignancy. These findings could reflect either primary lymphoma or bronchogenic carcinoma such as a small cell carcinoma. 2. There is also a small left lower lobe pulmonary nodule measuring 11 mm which is mildly hypermetabolic. This could potentially represent the primary bronchogenic neoplasm, but it is un usual for a small nodule of this size to result in such extensive lymphadenopathy.   01/02/2017 Imaging   MRI Brain IMPRESSION:  Positive for a solitary 13 mm rim enhancing metastasis in the left anterior inferior frontal gyrus (series 10, image 72. No associated edema or mass effect.   01/04/2017 -  Radiation Therapy   SIM Chest    01/11/2017 -  Chemotherapy   The  patient had palonosetron (ALOXI) injection 0.25 mg, 0.25 mg, Intravenous,  Once, 1 of 7 cycles  CARBOplatin (PARAPLATIN) 280 mg in sodium chloride 0.9 % 250 mL chemo infusion, 280 mg (100 % of original dose 281.6 mg), Intravenous,  Once, 1 of 7 cycles Dose modification: 281.6 mg (original dose 281.6 mg, Cycle 1)  PACLitaxel (TAXOL) 102 mg in dextrose 5 % 250 mL chemo infusion ( for chemotherapy treatment.     Large cell carcinoma of left lung, stage 4 ([place])  12/31/2016 Initial Diagnosis   Large cell carcinoma of left lung, stage 4 ([place])   10/27/2018 -  Chemotherapy   The patient had nivolumab (OPDIVO) 480 mg in sodium chloride 0.9 % 100 mL chemo infusion, 480 mg, Intravenous, Once, 13 of 20 cycles Administration: 480 mg (10/27/2018), 480 mg (11/24/2018), 480 mg (12/22/2018), 480 mg (01/19/2019), 480 mg (02/16/2019), 480 mg (03/16/2019), 480 mg (04/10/2019), 480 mg (05/11/2019), 480 mg (06/08/2019), 480 mg (07/05/2019), 480 mg (08/02/2019), 480 mg (08/30/2019), 480 mg (09/29/2019)  for chemotherapy treatment.     CNS Oncologic History: 09/29/2018 - 10/14/2018: Whole Brain / 30 Gy in 10 fractions  01/25/17 SRS Treatment: PTV1Left Frontal 26mm: 20 Gy in 1 fraction   Interval History:  Calvin Wagner presents to clinic for evaluation following recent MRI brain.  He describes no recent headaches.  No longer on decadron.  He remains functionally independent and intact otherwise.  No issues with gait or cognition.  Doing well with single agent nivolumab since the start of 2020.    Medications: Current Outpatient Medications on File Prior to Visit  Medication Sig Dispense Refill  . albuterol (  PROVENTIL HFA;VENTOLIN HFA) 108 (90 Base) MCG/ACT inhaler Inhale 2 puffs into the lungs every 4 (four) hours as needed.    Marland Kitchen amLODipine (NORVASC) 5 MG tablet Take 5 mg by mouth daily.    . budesonide (RA BUDESONIDE) 32 MCG/ACT nasal spray Place into both nostrils daily.    . BUDESONIDE PO Take by mouth. Budesonide  slurry    . chlorpheniramine-HYDROcodone (TUSSIONEX) 10-8 MG/5ML SUER Take 5 mLs by mouth every 12 (twelve) hours as needed for cough. 240 mL 0  . famotidine (PEPCID) 20 MG tablet Take 20 mg by mouth 2 (two) times daily.    . folic acid (FOLVITE) 1 MG tablet Take 1 tablet (1 mg total) by mouth daily. 30 tablet 4  . LORazepam (ATIVAN) 1 MG tablet TAKE 1-1 AND 1/2 TABSEVERY 4 HOURS AS NEEDED FOR ANXIETY (30 MINUTES PRIOR TO RADIOTHERAPY OR MRI SCANS, AND NAUSEA). 90 tablet 1  . losartan (COZAAR) 50 MG tablet Take 50 mg by mouth daily.    . magic mouthwash SOLN Take 5 mLs by mouth 4 (four) times daily as needed for mouth pain. 1:1:1 mix-Nystatin. Benadryl and extra strength maalox. 240 mL 0  . Multiple Vitamin (MULTIVITAMIN WITH MINERALS) TABS tablet Take 1 tablet by mouth daily.    . pantoprazole (PROTONIX) 40 MG tablet Take 40 mg by mouth daily.  2  . PROAIR HFA 108 (90 Base) MCG/ACT inhaler Inhale 2 puffs into the lungs every 4 (four) hours as needed.  5  . XARELTO 20 MG TABS tablet TAKE 1 TABLET BY MOUTH EVERY DAY WITH SUPPER 90 tablet 0  . ondansetron (ZOFRAN) 8 MG tablet TAKE 1 TABLET BY MOUTH EVERY 8 HOURS AS NEEDED FOR NAUSEA OR VOMITING. (Patient not taking: Reported on 08/14/2019) 90 tablet 0  . prochlorperazine (COMPAZINE) 10 MG tablet TAKE 1 TABLET BY MOUTH EVERY 6 HOURS AS NEEDED FOR NAUSEA OR VOMITING (Patient not taking: Reported on 08/02/2019) 90 tablet 0  . traMADol (ULTRAM) 50 MG tablet TAKE 1 TABLET BY MOUTH EVERY 12 HOURS AS NEEDED (Patient not taking: Reported on 04/10/2019) 30 tablet 0   No current facility-administered medications on file prior to visit.    Allergies: No Known Allergies Past Medical History:  Past Medical History:  Diagnosis Date  . Encounter for antineoplastic chemotherapy 12/31/2016  . GERD 11/08/2009   Qualifier: Diagnosis of  By: Ronnald Ramp MD, Arvid Right Goals of care, counseling/discussion 12/31/2016  . History of radiation therapy 01/13/2017 - 02/02/2017    Site/dose:  Left lung: 30 Gy in 15 fractions  . HYPERTENSION 11/08/2009   Qualifier: Diagnosis of  By: Ronnald Ramp MD, Arvid Right.   . Large cell carcinoma of left lung, stage 4 ([place]) 12/31/2016  . Migraine 02/25/2012  . Pneumonia   . PONV (postoperative nausea and vomiting)   . Rotator cuff tear, left    Past Surgical History:  Past Surgical History:  Procedure Laterality Date  . INGUINAL HERNIA REPAIR    . SHOULDER ARTHROSCOPY WITH ROTATOR CUFF REPAIR Left 11/12/2016   Procedure: SHOULDER ARTHROSCOPY WITH ROTATOR CUFF REPAIR AND SUBACROMIAL DECOMPRESSION;  Surgeon: Tania Ade, MD;  Location: [facility];  Service: Orthopedics;  Laterality: Left;  SHOULDER ARTHROSCOPY WITH ROTATOR CUFF REPAIR AND SUBACROMIAL DECOMPRESSION  . TONSILLECTOMY    . TOTAL KNEE ARTHROPLASTY    . VIDEO BRONCHOSCOPY Bilateral 12/10/2016   Procedure: VIDEO BRONCHOSCOPY WITHOUT FLUORO;  Surgeon: Tanda Rockers, MD;  Location: [facility];  Service: Cardiopulmonary;  Laterality: Bilateral;  Social History:  Social History   Socioeconomic History  . Marital status: Married    Spouse name: Baker Janus  . Number of children: 2  . Years of education: Not on file  . Highest education level: Not on file  Occupational History  . Occupation: Material Therapist, sports: [employer]  Tobacco Use  . Smoking status: Never Smoker  . Smokeless tobacco: Never Used  Substance and Sexual Activity  . Alcohol use: Yes    Comment: social  . Drug use: No  . Sexual activity: Not Currently  Other Topics Concern  . Not on file  Social History Narrative   Lives at home wife and 2 kids   Caffeine use: none     Social Determinants of Health   Financial Resource Strain:   . Difficulty of Paying Living Expenses: Not on file  Food Insecurity:   . Worried About Charity fundraiser in the Last Year: Not on file  . Ran Out of Food in the Last Year: Not on file  Transportation Needs: No Transportation Needs  . Lack of Transportation  (Medical): No  . Lack of Transportation (Non-Medical): No  Physical Activity:   . Days of Exercise per Week: Not on file  . Minutes of Exercise per Session: Not on file  Stress:   . Feeling of Stress : Not on file  Social Connections:   . Frequency of Communication with Friends and Family: Not on file  . Frequency of Social Gatherings with Friends and Family: Not on file  . Attends Religious Services: Not on file  . Active Member of Clubs or Organizations: Not on file  . Attends Archivist Meetings: Not on file  . Marital Status: Not on file  Intimate Partner Violence:   . Fear of Current or Ex-Partner: Not on file  . Emotionally Abused: Not on file  . Physically Abused: Not on file  . Sexually Abused: Not on file   Family History:  Family History  Problem Relation Age of Onset  . Heart disease Mother 12  . Lung cancer Other        family history  . CAD Other        strong family history male and male <50  . Mental retardation Other   . Heart attack Brother 67  . Alcohol abuse Neg Hx   . Diabetes Neg Hx   . Early death Neg Hx   . Hyperlipidemia Neg Hx   . Hypertension Neg Hx   . Kidney disease Neg Hx   . Stroke Neg Hx   . Migraines Neg Hx     Review of Systems: Constitutional: Denies fevers, chills or abnormal weight loss Eyes: Denies blurriness of vision Ears, nose, mouth, throat, and face: Denies mucositis or sore throat Respiratory: Denies cough, dyspnea or wheezes Cardiovascular: Denies palpitation, chest discomfort or lower extremity swelling Gastrointestinal:  Denies nausea, constipation, diarrhea GU: Denies dysuria or incontinence Skin: Denies abnormal skin rashes Neurological: Per HPI Musculoskeletal: Denies joint pain, back or neck discomfort. No decrease in ROM Behavioral/Psych: Denies anxiety, disturbance in thought content, and mood instability   Physical Exam: Vitals:   10/12/19 1206  BP: (!) 131/91  Pulse: (!) 116  Resp: 18  Temp:  98.9 F (37.2 C)  SpO2: 99%   KPS: 90. General: Alert, cooperative, pleasant, in no acute distress Head: Normal EENT: No conjunctival injection or scleral icterus. Oral mucosa moist Lungs: Resp effort normal Cardiac: Regular rate  and rhythm Abdomen: Soft, non-distended abdomen Skin: No rashes cyanosis or petechiae. Extremities: No clubbing or edema  Neurologic Exam: Mental Status: Awake, alert, attentive to examiner. Oriented to self and environment. Language is fluent with intact comprehension.  Cranial Nerves: Visual acuity is grossly normal. Visual fields are full. Extra-ocular movements intact. No ptosis. Face is symmetric, tongue midline. Motor: Tone and bulk are normal. Power is full in both arms and legs. Reflexes are symmetric, no pathologic reflexes present. Intact finger to nose bilaterally Sensory: Intact to light touch and temperature Gait: Normal    Labs: I have reviewed the data as listed    Component Value Date/Time   NA 140 09/29/2019 1016   NA 138 10/08/2017 0907   K 3.9 09/29/2019 1016   K 4.1 10/08/2017 0907   CL 104 09/29/2019 1016   CO2 25 09/29/2019 1016   CO2 24 10/08/2017 0907   GLUCOSE 184 (H) 09/29/2019 1016   GLUCOSE 138 10/08/2017 0907   BUN 13 09/29/2019 1016   BUN 11.7 10/08/2017 0907   CREATININE 1.28 (H) 09/29/2019 1016   CREATININE 1.2 10/08/2017 0907   CALCIUM 8.8 (L) 09/29/2019 1016   CALCIUM 9.5 10/08/2017 0907   PROT 6.7 09/29/2019 1016   PROT 7.5 10/08/2017 0907   ALBUMIN 3.5 09/29/2019 1016   ALBUMIN 3.6 10/08/2017 0907   AST 14 (L) 09/29/2019 1016   AST 22 10/08/2017 0907   ALT 19 09/29/2019 1016   ALT 24 10/08/2017 0907   ALKPHOS 93 09/29/2019 1016   ALKPHOS 88 10/08/2017 0907   BILITOT 0.5 09/29/2019 1016   BILITOT 0.42 10/08/2017 0907   GFRNONAA >60 09/29/2019 1016   GFRAA >60 09/29/2019 1016   Lab Results  Component Value Date   WBC 4.3 09/29/2019   NEUTROABS 2.8 09/29/2019   HGB 14.3 09/29/2019   HCT 41.9  09/29/2019   MCV 104.8 (H) 09/29/2019   PLT 218 09/29/2019    Imaging:  MR BRAIN W WO CONTRAST  Result Date: 10/09/2019 CLINICAL DATA:  Metastatic lung cancer.  Prior radiation therapy EXAM: MRI HEAD WITHOUT AND WITH CONTRAST TECHNIQUE: Multiplanar, multiecho pulse sequences of the brain and surrounding structures were obtained without and with intravenous contrast. CONTRAST:  1mL GADAVIST GADOBUTROL 1 MMOL/ML IV SOLN COMPARISON:  MRI head 08/07/2019, 04/27/2019 FINDINGS: Brain: Irregularly-shaped rim enhancing mass lesion in the left inferior frontal lobe approximately the same size as previously, measuring 26 x 40 mm on axial images. Small amount of internal hemorrhage. Central necrosis. Surrounding white matter edema and mass-effect show mild interval improvement. Decreased displacement of the left frontal horn. Decreased edema in the corpus callosum. Enlarging enhancing mass in the left frontal operculum now measuring 6 mm. Increased surrounding edema compared to the prior study. This lesion is not identified on 04/27/2019, or 09/21/2018 and is likely metastatic disease. No other enhancing lesions. Negative for acute infarct or hydrocephalus. Vascular: Normal arterial flow voids. Skull and upper cervical spine: No focal skeletal lesion. Sinuses/Orbits: Mild mucosal edema paranasal sinuses.  Normal orbit Other: None IMPRESSION: Irregular enhancing lesion left frontal lobe approximately the same in size. Decreased surrounding edema and mass-effect. 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 Continued follow-up recommended Interval enlargement of 6 mm enhancing lesion left frontal operculum with increased surrounding edema, suspicious for new metastatic disease. Electronically Signed   By: Franchot Gallo M.D.   On: 10/09/2019 14:24    [facility] Clinician Interpretation: I have personally reviewed the radiological images as listed.  My interpretation, in the context of the patient's  clinical presentation, is stable left  frontal mass c/w radionecrosis, new left temporal metastasis   Assessment/Plan Brain Metastases  Calvin Wagner is clinically stable today from neurologic standpoint.  MRI demonstrates stability of left frontal progressive mass, most consistent with radioinflammatory process rather than organic tumor.  The left temporal lesion is very likely novel metastatic process.  This area did not demonstrate enhancement prior to WBRT.  We recommended evaluation with Dr. Lisbeth Renshaw for likely salvage radiosurgery, given time interval between most recent RT exposure.   Will continue off decadron in the interim.  We appreciate the opportunity to participate in the care of Calvin Wagner.  We will plan to follow up with him in 3 months after post [facility] MRI scan, or sooner if needed.  All questions were answered. The patient knows to call the clinic with any problems, questions or concerns. No barriers to learning were detected.  The total time spent in the encounter was 25 minutes and more than 50% was on counseling and review of test results   Ventura Sellers, MD Medical Director of Neuro-Oncology [facility] [place] 10/12/19 12:26 PM

## 2019-10-20 ENCOUNTER — Other Ambulatory Visit: Payer: 59

## 2019-10-23 ENCOUNTER — Telehealth: Payer: Self-pay | Admitting: Radiation Oncology

## 2019-10-23 NOTE — Telephone Encounter (Signed)
I called with Mont Dutton, RT. We left a message for his wife communicating the discussion from conference this am and would like to coordinate treatment and simulation. We asked that they call back to coordinate as they were already told by Dr. Mickeal Skinner that treatment was likely recommended for a new temporal lesion.

## 2019-10-24 ENCOUNTER — Ambulatory Visit: Payer: Medicare Other

## 2019-10-24 ENCOUNTER — Ambulatory Visit: Payer: Medicare Other | Admitting: Radiation Oncology

## 2019-10-25 ENCOUNTER — Inpatient Hospital Stay: Payer: Medicare Other | Attending: Internal Medicine | Admitting: Internal Medicine

## 2019-10-25 ENCOUNTER — Encounter: Payer: Self-pay | Admitting: Internal Medicine

## 2019-10-25 ENCOUNTER — Inpatient Hospital Stay: Payer: Medicare Other

## 2019-10-25 ENCOUNTER — Other Ambulatory Visit: Payer: Self-pay

## 2019-10-25 VITALS — BP 137/96 | HR 130 | Temp 98.2°F | Resp 20 | Ht 73.0 in | Wt 250.8 lb

## 2019-10-25 DIAGNOSIS — R110 Nausea: Secondary | ICD-10-CM | POA: Diagnosis not present

## 2019-10-25 DIAGNOSIS — C3432 Malignant neoplasm of lower lobe, left bronchus or lung: Secondary | ICD-10-CM | POA: Insufficient documentation

## 2019-10-25 DIAGNOSIS — C3492 Malignant neoplasm of unspecified part of left bronchus or lung: Secondary | ICD-10-CM

## 2019-10-25 DIAGNOSIS — K219 Gastro-esophageal reflux disease without esophagitis: Secondary | ICD-10-CM | POA: Diagnosis not present

## 2019-10-25 DIAGNOSIS — C7931 Secondary malignant neoplasm of brain: Secondary | ICD-10-CM | POA: Diagnosis present

## 2019-10-25 DIAGNOSIS — C3490 Malignant neoplasm of unspecified part of unspecified bronchus or lung: Secondary | ICD-10-CM

## 2019-10-25 DIAGNOSIS — Z7901 Long term (current) use of anticoagulants: Secondary | ICD-10-CM | POA: Diagnosis not present

## 2019-10-25 DIAGNOSIS — Z923 Personal history of irradiation: Secondary | ICD-10-CM | POA: Diagnosis not present

## 2019-10-25 DIAGNOSIS — Z79899 Other long term (current) drug therapy: Secondary | ICD-10-CM | POA: Diagnosis not present

## 2019-10-25 DIAGNOSIS — R5383 Other fatigue: Secondary | ICD-10-CM

## 2019-10-25 DIAGNOSIS — I10 Essential (primary) hypertension: Secondary | ICD-10-CM | POA: Insufficient documentation

## 2019-10-25 DIAGNOSIS — Z5112 Encounter for antineoplastic immunotherapy: Secondary | ICD-10-CM | POA: Insufficient documentation

## 2019-10-25 LAB — CBC WITH DIFFERENTIAL (CANCER CENTER ONLY)
Abs Immature Granulocytes: 0 10*3/uL (ref 0.00–0.07)
Basophils Absolute: 0 10*3/uL (ref 0.0–0.1)
Basophils Relative: 1 %
Eosinophils Absolute: 0.2 10*3/uL (ref 0.0–0.5)
Eosinophils Relative: 6 %
HCT: 45.2 % (ref 39.0–52.0)
Hemoglobin: 15.6 g/dL (ref 13.0–17.0)
Immature Granulocytes: 0 %
Lymphocytes Relative: 29 %
Lymphs Abs: 1.2 10*3/uL (ref 0.7–4.0)
MCH: 35.6 pg — ABNORMAL HIGH (ref 26.0–34.0)
MCHC: 34.5 g/dL (ref 30.0–36.0)
MCV: 103.2 fL — ABNORMAL HIGH (ref 80.0–100.0)
Monocytes Absolute: 0.4 10*3/uL (ref 0.1–1.0)
Monocytes Relative: 10 %
Neutro Abs: 2.2 10*3/uL (ref 1.7–7.7)
Neutrophils Relative %: 54 %
Platelet Count: 211 10*3/uL (ref 150–400)
RBC: 4.38 MIL/uL (ref 4.22–5.81)
RDW: 13.8 % (ref 11.5–15.5)
WBC Count: 4.1 10*3/uL (ref 4.0–10.5)
nRBC: 0 % (ref 0.0–0.2)

## 2019-10-25 LAB — TSH: TSH: 2.597 u[IU]/mL (ref 0.320–4.118)

## 2019-10-25 LAB — CMP (CANCER CENTER ONLY)
ALT: 26 U/L (ref 0–44)
AST: 20 U/L (ref 15–41)
Albumin: 3.6 g/dL (ref 3.5–5.0)
Alkaline Phosphatase: 89 U/L (ref 38–126)
Anion gap: 13 (ref 5–15)
BUN: 8 mg/dL (ref 6–20)
CO2: 21 mmol/L — ABNORMAL LOW (ref 22–32)
Calcium: 8.6 mg/dL — ABNORMAL LOW (ref 8.9–10.3)
Chloride: 106 mmol/L (ref 98–111)
Creatinine: 1.21 mg/dL (ref 0.61–1.24)
GFR, Est AFR Am: >60 mL/min (ref >60)
GFR, Estimated: >60 mL/min (ref >60)
Glucose, Bld: 195 mg/dL — ABNORMAL HIGH (ref 70–99)
Potassium: 3.5 mmol/L (ref 3.5–5.1)
Sodium: 140 mmol/L (ref 135–145)
Total Bilirubin: 0.5 mg/dL (ref 0.3–1.2)
Total Protein: 6.8 g/dL (ref 6.5–8.1)

## 2019-10-25 MED ORDER — SODIUM CHLORIDE 0.9 % IV SOLN
480.0000 mg | Freq: Once | INTRAVENOUS | Status: AC
  Administered 2019-10-25: 480 mg via INTRAVENOUS
  Filled 2019-10-25: qty 48

## 2019-10-25 MED ORDER — SODIUM CHLORIDE 0.9 % IV SOLN
Freq: Once | INTRAVENOUS | Status: AC
  Filled 2019-10-25: qty 250

## 2019-10-25 NOTE — Progress Notes (Signed)
Per Dr. Julien Nordmann it is okay to treat pt today with Alimta and today's heart rate.

## 2019-10-25 NOTE — Progress Notes (Signed)
Disregard my previous note. Per Dr Julien Nordmann it is okay to treat pt today with Opdivo and today 's heart rate. Pt is not scheduled for Alimta.

## 2019-10-25 NOTE — Progress Notes (Signed)
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  PRIOR THERAPY:  1) Stereotactic radiotherapy to a solitary brain metastasis under the care of Dr. Lisbeth Renshaw on 01/25/2017. 2) Short course of concurrent chemoradiation with weekly carboplatin for AUC of 2 and paclitaxel 45 MG/M2 to the locally advanced disease in the chest. Status post 3 cycles. 3) Systemic chemotherapy with carboplatin for AUC of 5, Alimta 500 MG/M2 and Avastin 15 MG/KG every 3 weeks. First dose of 02/15/2017. Status post 6 cycles. Last dose 06/08/2017 with stable disease. 4) whole brain irradiation under the care of Dr. Lisbeth Renshaw expected to complete on October 14, 2018. 5) Maintenance systemic chemotherapy with Alimta 500 MG/M2 in addition to Avastin 15 MG/KG every 3 weeks. First dose 07/13/2017.  Status post 20 cycles.  Starting from cycle #16 he will be treated with single agent Alimta only secondary to intolerance.   CURRENT THERAPY: Second line treatment with immunotherapy with nivolumab 480 mg IV every 4 weeks.  First dose October 26, 2018.  Status post 13 cycles.  INTERVAL HISTORY: Calvin Wagner 54 y.o. male returns to the clinic today for follow-up visit.  The patient is feeling fine today with no concerning complaints except for occasional nausea.  He denied having any current chest pain, shortness of breath, cough or hemoptysis.  He denied having any fever or chills.  He has no vomiting, abdominal pain, diarrhea or constipation.  He has no recent weight loss or night sweats.  He continues to  tolerate his treatment with nivolumab fairly well.  The patient is here today for evaluation before starting cycle #14 of his treatment. He is a scheduled for SRS to the new brain metastasis under the care of Dr. Lisbeth Renshaw next week.   MEDICAL HISTORY: Past Medical History:  Diagnosis Date  . Encounter for antineoplastic chemotherapy 12/31/2016  . GERD 11/08/2009   Qualifier: Diagnosis of  By: Ronnald Ramp MD, Arvid Right Goals of care, counseling/discussion 12/31/2016  . History of radiation therapy 01/13/2017 - 02/02/2017   Site/dose:  Left lung: 30 Gy in 15 fractions  . HYPERTENSION 11/08/2009   Qualifier: Diagnosis of  By: Ronnald Ramp MD, Arvid Right.   . 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 Migraine 02/25/2012  . Pneumonia   . PONV (postoperative nausea and vomiting)   . Rotator cuff tear, left     ALLERGIES:  has No Known Allergies.  MEDICATIONS:  Current Outpatient Medications  Medication Sig Dispense Refill  . albuterol (PROVENTIL HFA;VENTOLIN HFA) 108 (90 Base) MCG/ACT inhaler Inhale 2 puffs into the lungs every 4 (four) hours as needed.    Marland Kitchen amLODipine (NORVASC) 5 MG tablet Take 5 mg by mouth daily.    . budesonide (RA BUDESONIDE) 32 MCG/ACT nasal spray Place into both nostrils daily.    . BUDESONIDE PO Take by mouth. Budesonide slurry    . chlorpheniramine-HYDROcodone (TUSSIONEX) 10-8 MG/5ML SUER Take 5 mLs by mouth every 12 (twelve) hours as needed for cough. 240 mL 0  . famotidine (PEPCID) 20 MG tablet Take 20 mg by mouth 2 (two)  times daily.    . folic acid (FOLVITE) 1 MG tablet Take 1 tablet (1 mg total) by mouth daily. 30 tablet 4  . LORazepam (ATIVAN) 1 MG tablet TAKE 1-1 AND 1/2 TABSEVERY 4 HOURS AS NEEDED FOR ANXIETY (30 MINUTES PRIOR TO RADIOTHERAPY OR MRI SCANS, AND NAUSEA). 90 tablet 1  . losartan (COZAAR) 50 MG tablet Take 50 mg by mouth daily.    . magic mouthwash SOLN Take 5 mLs by mouth 4 (four) times daily as needed for mouth pain. 1:1:1 mix-Nystatin.  Benadryl and extra strength maalox. 240 mL 0  . Multiple Vitamin (MULTIVITAMIN WITH MINERALS) TABS tablet Take 1 tablet by mouth daily.    . ondansetron (ZOFRAN) 8 MG tablet TAKE 1 TABLET BY MOUTH EVERY 8 HOURS AS NEEDED FOR NAUSEA OR VOMITING. 90 tablet 0  . pantoprazole (PROTONIX) 40 MG tablet Take 40 mg by mouth daily.  2  . PROAIR HFA 108 (90 Base) MCG/ACT inhaler Inhale 2 puffs into the lungs every 4 (four) hours as needed.  5  . prochlorperazine (COMPAZINE) 10 MG tablet TAKE 1 TABLET BY MOUTH EVERY 6 HOURS AS NEEDED FOR NAUSEA OR VOMITING 90 tablet 0  . XARELTO 20 MG TABS tablet TAKE 1 TABLET BY MOUTH EVERY DAY WITH SUPPER 90 tablet 0  . traMADol (ULTRAM) 50 MG tablet TAKE 1 TABLET BY MOUTH EVERY 12 HOURS AS NEEDED (Patient not taking: Reported on 04/10/2019) 30 tablet 0   No current facility-administered medications for this visit.    SURGICAL HISTORY:  Past Surgical History:  Procedure Laterality Date  . INGUINAL HERNIA REPAIR    . SHOULDER ARTHROSCOPY WITH ROTATOR CUFF REPAIR Left 11/12/2016   Procedure: SHOULDER ARTHROSCOPY WITH ROTATOR CUFF REPAIR AND SUBACROMIAL DECOMPRESSION;  Surgeon: Tania Ade, MD;  Location: [facility];  Service: Orthopedics;  Laterality: Left;  SHOULDER ARTHROSCOPY WITH ROTATOR CUFF REPAIR AND SUBACROMIAL DECOMPRESSION  . TONSILLECTOMY    . TOTAL KNEE ARTHROPLASTY    . VIDEO BRONCHOSCOPY Bilateral 12/10/2016   Procedure: VIDEO BRONCHOSCOPY WITHOUT FLUORO;  Surgeon: Tanda Rockers, MD;  Location: [facility];  Service: Cardiopulmonary;  Laterality: Bilateral;    REVIEW OF SYSTEMS:  A comprehensive review of systems was negative except for: Gastrointestinal: positive for nausea   PHYSICAL EXAMINATION: General appearance: alert, cooperative and no distress Head: Normocephalic, without obvious abnormality, atraumatic Neck: no adenopathy, no JVD, supple, symmetrical, trachea midline and thyroid not enlarged, symmetric, no tenderness/mass/nodules Lymph nodes:  Cervical, supraclavicular, and axillary nodes normal. Resp: clear to auscultation bilaterally Back: symmetric, no curvature. ROM normal. No CVA tenderness. Cardio: regular rate and rhythm, S1, S2 normal, no murmur, click, rub or gallop GI: soft, non-tender; bowel sounds normal; no masses,  no organomegaly Extremities: extremities normal, atraumatic, no cyanosis or edema  ECOG PERFORMANCE STATUS: 1 - Symptomatic but completely ambulatory  Blood pressure (!) 137/96, pulse (!) 130, temperature 98.2 F (36.8 C), temperature source Temporal, resp. rate 20, height 6\' 1"  (1.854 m), weight 250 lb 12.8 oz (113.8 kg), SpO2 98 %.  LABORATORY DATA: Lab Results  Component Value Date   WBC 4.1 10/25/2019   HGB 15.6 10/25/2019   HCT 45.2 10/25/2019   MCV 103.2 (H) 10/25/2019   PLT 211 10/25/2019      Chemistry      Component Value Date/Time   NA 140 10/25/2019 0834   NA 138 10/08/2017 0907   K 3.5 10/25/2019 0834   K 4.1 10/08/2017 0907   CL 106 10/25/2019 0834   CO2  21 (L) 10/25/2019 0834   CO2 24 10/08/2017 0907   BUN 8 10/25/2019 0834   BUN 11.7 10/08/2017 0907   CREATININE 1.21 10/25/2019 0834   CREATININE 1.2 10/08/2017 0907      Component Value Date/Time   CALCIUM 8.6 (L) 10/25/2019 0834   CALCIUM 9.5 10/08/2017 0907   ALKPHOS 89 10/25/2019 0834   ALKPHOS 88 10/08/2017 0907   AST 20 10/25/2019 0834   AST 22 10/08/2017 0907   ALT 26 10/25/2019 0834   ALT 24 10/08/2017 0907   BILITOT 0.5 10/25/2019 0834   BILITOT 0.42 10/08/2017 0907       RADIOGRAPHIC STUDIES: MR BRAIN W WO CONTRAST  Result Date: 10/09/2019 CLINICAL DATA:  Metastatic lung cancer.  Prior radiation therapy EXAM: MRI HEAD WITHOUT AND WITH CONTRAST TECHNIQUE: Multiplanar, multiecho pulse sequences of the brain and surrounding structures were obtained without and with intravenous contrast. CONTRAST:  64mL GADAVIST GADOBUTROL 1 MMOL/ML IV SOLN COMPARISON:  MRI head 08/07/2019, 04/27/2019 FINDINGS: Brain:  Irregularly-shaped rim enhancing mass lesion in the left inferior frontal lobe approximately the same size as previously, measuring 26 x 40 mm on axial images. Small amount of internal hemorrhage. Central necrosis. Surrounding white matter edema and mass-effect show mild interval improvement. Decreased displacement of the left frontal horn. Decreased edema in the corpus callosum. Enlarging enhancing mass in the left frontal operculum now measuring 6 mm. Increased surrounding edema compared to the prior study. This lesion is not identified on 04/27/2019, or 09/21/2018 and is likely metastatic disease. No other enhancing lesions. Negative for acute infarct or hydrocephalus. Vascular: Normal arterial flow voids. Skull and upper cervical spine: No focal skeletal lesion. Sinuses/Orbits: Mild mucosal edema paranasal sinuses.  Normal orbit Other: None IMPRESSION: Irregular enhancing lesion left frontal lobe approximately the same in size. Decreased surrounding edema and mass-effect. 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 Continued follow-up recommended Interval enlargement of 6 mm enhancing lesion left frontal operculum with increased surrounding edema, suspicious for new metastatic disease. Electronically Signed   By: Franchot Gallo M.D.   On: 10/09/2019 14:24    ASSESSMENT AND PLAN: This is a very pleasant 54 years old white male with stage IV non-small cell lung cancer, high-grade neuroendocrine carcinoma presented with locally advanced disease in the chest as well as solitary brain metastasis and 2 liver lesions. The patient is status post stereotactic radiotherapy to the solitary brain metastasis as well as short course of concurrent chemoradiation to the locally advanced disease in the chest. He recently completed 6 cycles of systemic chemotherapy with carboplatin, Alimta and Avastin and tolerated this treatment fairly well. He was started on maintenance treatment with Alimta and Avastin status post 15 cycles.  Avastin  was discontinued secondary to intolerance to the combination treatment.  He is currently on treatment with single agent Alimta status post 5 cycles. He has been tolerating his treatment well. Unfortunately he was recently found to have multiple metastatic brain lesion and the patient underwent whole brain irradiation. His scan showed evidence for disease progression with enlargement of left lower lobe lung nodule in addition to hilar lymphadenopathy as well as progression of the liver lesion. He was started on treatment with nivolumab 480 mg IV every 4 weeks status post 13 cycles.  The patient continues to tolerate this treatment well with no concerning adverse effects. I recommended for him to proceed with cycle #14 today as planned. I will see him back for follow-up visit in 4 weeks for evaluation with repeat CT scan of the chest,  abdomen pelvis for restaging of his disease. For the new brain metastasis, is expected to have [place] treatment to this solitary lesion.  He is not currently on any steroid treatment. He was advised to call immediately if he has any concerning symptoms in the interval. The patient voices understanding of current disease status and treatment options and is in agreement with the current care plan. All questions were answered. The patient knows to call the clinic with any problems, questions or concerns. We can certainly see the patient much sooner if necessary.  Disclaimer: This note was dictated with voice recognition software. Similar sounding words can inadvertently be transcribed and may not be corrected upon review.

## 2019-10-25 NOTE — Patient Instructions (Signed)
Nivolumab injection What is this medicine? NIVOLUMAB (nye VOL ue mab) is a monoclonal antibody. It is used to treat colon cancer, esophageal cancer, head and neck cancer, Hodgkin lymphoma, kidney cancer, liver cancer, lung cancer, mesothelioma, melanoma, and urothelial cancer. This medicine may be used for other purposes; ask your health care provider or pharmacist if you have questions. COMMON BRAND NAME(S): Opdivo What should I tell my health care provider before I take this medicine? They need to know if you have any of these conditions:  diabetes  immune system problems  kidney disease  liver disease  lung disease  organ transplant  stomach or intestine problems  thyroid disease  an unusual or allergic reaction to nivolumab, other medicines, foods, dyes, or preservatives  pregnant or trying to get pregnant  breast-feeding How should I use this medicine? This medicine is for infusion into a vein. It is given by a health care professional in a hospital or clinic setting. A special MedGuide will be given to you before each treatment. Be sure to read this information carefully each time. Talk to your pediatrician regarding the use of this medicine in children. While this drug may be prescribed for children as young as 12 years for selected conditions, precautions do apply. Overdosage: If you think you have taken too much of this medicine contact a poison control center or emergency room at once. NOTE: This medicine is only for you. Do not share this medicine with others. What if I miss a dose? It is important not to miss your dose. Call your doctor or health care professional if you are unable to keep an appointment. What may interact with this medicine? Interactions have not been studied. Give your health care provider a list of all the medicines, herbs, non-prescription drugs, or dietary supplements you use. Also tell them if you smoke, drink alcohol, or use illegal drugs.  Some items may interact with your medicine. This list may not describe all possible interactions. Give your health care provider a list of all the medicines, herbs, non-prescription drugs, or dietary supplements you use. Also tell them if you smoke, drink alcohol, or use illegal drugs. Some items may interact with your medicine. What should I watch for while using this medicine? This drug may make you feel generally unwell. Continue your course of treatment even though you feel ill unless your doctor tells you to stop. You may need blood work done while you are taking this medicine. Do not become pregnant while taking this medicine or for 5 months after stopping it. Women should inform their doctor if they wish to become pregnant or think they might be pregnant. There is a potential for serious side effects to an unborn child. Talk to your health care professional or pharmacist for more information. Do not breast-feed an infant while taking this medicine or for 5 months after stopping it. What side effects may I notice from receiving this medicine? Side effects that you should report to your doctor or health care professional as soon as possible:  allergic reactions like skin rash, itching or hives, swelling of the face, lips, or tongue  breathing problems  blood in the urine  bloody or watery diarrhea or black, tarry stools  changes in emotions or moods  changes in vision  chest pain  cough  dizziness  feeling faint or lightheaded, falls  fever, chills  headache with fever, neck stiffness, confusion, loss of memory, sensitivity to light, hallucination, loss of contact with reality, or   seizures  joint pain  mouth sores  redness, blistering, peeling or loosening of the skin, including inside the mouth  severe muscle pain or weakness  signs and symptoms of high blood sugar such as dizziness; dry mouth; dry skin; fruity breath; nausea; stomach pain; increased hunger or thirst;  increased urination  signs and symptoms of kidney injury like trouble passing urine or change in the amount of urine  signs and symptoms of liver injury like dark yellow or brown urine; general ill feeling or flu-like symptoms; light-colored stools; loss of appetite; nausea; right upper belly pain; unusually weak or tired; yellowing of the eyes or skin  swelling of the ankles, feet, hands  trouble passing urine or change in the amount of urine  unusually weak or tired  weight gain or loss Side effects that usually do not require medical attention (report to your doctor or health care professional if they continue or are bothersome):  bone pain  constipation  decreased appetite  diarrhea  muscle pain  nausea, vomiting  tiredness This list may not describe all possible side effects. Call your doctor for medical advice about side effects. You may report side effects to FDA at 1-800-FDA-1088. Where should I keep my medicine? This drug is given in a hospital or clinic and will not be stored at home. NOTE: This sheet is a summary. It may not cover all possible information. If you have questions about this medicine, talk to your doctor, pharmacist, or health care provider.  2020 Elsevier/Gold Standard (2019-07-18 10:04:50)  Coronavirus (COVID-19) Are you at risk?  Are you at risk for the Coronavirus (COVID-19)?  To be considered HIGH RISK for Coronavirus (COVID-19), you have to meet the following criteria:  . Traveled to [place], [place], [place], [place] or [place]; or in the [place] to [place], [place], [place], or [place]; and have fever, cough, and shortness of breath within the last 2 weeks of travel OR . Been in close contact with a person diagnosed with COVID-19 within the last 2 weeks and have fever, cough, and shortness of breath . IF YOU DO NOT MEET THESE CRITERIA, YOU ARE CONSIDERED LOW RISK FOR COVID-19.  What to do if you are HIGH RISK for  COVID-19?  . If you are having a medical emergency, call 911. . Seek medical care right away. Before you go to a doctor's office, urgent care or emergency department, call ahead and tell them about your recent travel, contact with someone diagnosed with COVID-19, and your symptoms. You should receive instructions from your physician's office regarding next steps of care.  . When you arrive at healthcare provider, tell the healthcare staff immediately you have returned from visiting [place], [place], [place], [place] or [place]; or traveled in the [place] to [place], [place], [place], or [place]; in the last two weeks or you have been in close contact with a person diagnosed with COVID-19 in the last 2 weeks.   . Tell the health care staff about your symptoms: fever, cough and shortness of breath. . After you have been seen by a medical provider, you will be either: o Tested for (COVID-19) and discharged home on quarantine except to seek medical care if symptoms worsen, and asked to  - Stay home and avoid contact with others until you get your results (4-5 days)  - Avoid travel on public transportation if possible (such as bus, train, or airplane) or o Sent to the Emergency Department by   EMS for evaluation, COVID-19 testing, and possible admission depending on your condition and test results.  What to do if you are LOW RISK for COVID-19?  Reduce your risk of any infection by using the same precautions used for avoiding the common cold or flu:  . Wash your hands often with soap and warm water for at least 20 seconds.  If soap and water are not readily available, use an alcohol-based hand sanitizer with at least 60% alcohol.  . If coughing or sneezing, cover your mouth and nose by coughing or sneezing into the elbow areas of your shirt or coat, into a tissue or into your sleeve (not your hands). . Avoid shaking hands with others and consider head nods or verbal greetings only. . Avoid  touching your eyes, nose, or mouth with unwashed hands.  . Avoid close contact with people who are sick. . Avoid places or events with large numbers of people in one location, like concerts or sporting events. . Carefully consider travel plans you have or are making. . If you are planning any travel outside or inside the [place], visit the CDC's Travelers' Health webpage for the latest health notices. . If you have some symptoms but not all symptoms, continue to monitor at home and seek medical attention if your symptoms worsen. . If you are having a medical emergency, call 911.   ADDITIONAL HEALTHCARE OPTIONS FOR PATIENTS  [facility] / e-Visit: [email or web address]         [facility]: [phone]  [facility]: [phone]                   [facility]: [phone]   

## 2019-10-26 ENCOUNTER — Telehealth: Payer: Self-pay | Admitting: Internal Medicine

## 2019-10-26 NOTE — Telephone Encounter (Signed)
Scheduled per los. Called and left msg. Mailed printout  °

## 2019-10-30 ENCOUNTER — Ambulatory Visit
Admission: RE | Admit: 2019-10-30 | Discharge: 2019-10-30 | Disposition: A | Discharge status: Home / Self Care | Payer: Medicare Other | Source: Ambulatory Visit | Attending: Radiation Oncology | Admitting: Radiation Oncology

## 2019-10-30 ENCOUNTER — Other Ambulatory Visit: Payer: Self-pay

## 2019-10-30 VITALS — BP 125/96 | HR 122 | Temp 98.3°F | Resp 20 | Wt 249.2 lb

## 2019-10-30 DIAGNOSIS — C7931 Secondary malignant neoplasm of brain: Secondary | ICD-10-CM | POA: Insufficient documentation

## 2019-10-30 DIAGNOSIS — Z510 Encounter for antineoplastic radiation therapy: Secondary | ICD-10-CM | POA: Insufficient documentation

## 2019-10-30 DIAGNOSIS — C3492 Malignant neoplasm of unspecified part of left bronchus or lung: Secondary | ICD-10-CM | POA: Diagnosis present

## 2019-10-30 NOTE — Progress Notes (Addendum)
Has armband been applied?  Yes.    Does patient have an allergy to IV contrast dye?: No.   Has patient ever received premedication for IV contrast dye?: No.   Does patient take metformin?: No.  Date of lab work: October 25, 2019 BUN: 8 CR: 1.21  IV site: antecubital left, condition patent and no redness  Has IV site been added to flowsheet?  Yes.    Vitals:   10/30/19 1043  BP: (!) 125/96  Pulse: (!) 122  Resp: 20  Temp: 98.3 F (36.8 C)  SpO2: 99%  Weight: 249 lb 3.2 oz (113 kg)   IV removed in simulation area by Lavonda Jumbo once procedure was complete. Per Manuela Schwartz: catheter intact and new dressing clean/dry/intact. Patient and wife left clinic without issue.

## 2019-10-30 NOTE — Progress Notes (Signed)
The patient had previously discussed his new finding of the lesion in the frontal operculum with Dr. Mickeal Skinner, Dr. Mickeal Skinner reached out to Dr. Lisbeth Renshaw who agreed that this would be a good site for considering salvage SRS.  The patient came in today, has had IV placed by nursing, and via WebEx we were able to discuss his situation and obtain consent to proceed.  He will be able to move forward with the open facemask per Dr. Lisbeth Renshaw, this is considered off clinical trial.  His claustrophobia however prevents him from being able to tolerate a closed mask.  He is in agreement with this plan.  We will proceed with a single fraction of stereotactic radiosurgery to this location which is scheduled for 11/03/2019.    Carola Rhine, PAC

## 2019-11-02 DIAGNOSIS — Z510 Encounter for antineoplastic radiation therapy: Secondary | ICD-10-CM | POA: Diagnosis not present

## 2019-11-03 ENCOUNTER — Encounter: Payer: Self-pay | Admitting: Radiation Oncology

## 2019-11-03 ENCOUNTER — Ambulatory Visit
Admission: RE | Admit: 2019-11-03 | Discharge: 2019-11-03 | Disposition: A | Discharge status: Home / Self Care | Payer: Medicare Other | Source: Ambulatory Visit | Attending: Radiation Oncology | Admitting: Radiation Oncology

## 2019-11-03 ENCOUNTER — Other Ambulatory Visit: Payer: Self-pay

## 2019-11-03 DIAGNOSIS — Z510 Encounter for antineoplastic radiation therapy: Secondary | ICD-10-CM | POA: Diagnosis not present

## 2019-11-03 NOTE — Progress Notes (Signed)
Patient monitored for 30 min post SRS treatment. Vitals stable and patient denied any complaints of dizziness/lightheadness. Instructed to avoid strenuous activity for the next 24 hours, and to call the on-call provider or proceed to ED over the weekend should he experience any new/concerning symptoms (vision changes, hearing changes, lightheadedness/diziness, balance issues, etc.) Patient and wife verbalized understanding and agreement. Patient ambulated out of clinic without issue.   Vitals:   11/03/19 1329  BP: 127/85  Pulse: (!) 112  Resp: 20  Temp: 99.1 F (37.3 C)  SpO2: 100%

## 2019-11-03 NOTE — Op Note (Signed)
Name: Calvin Wagner    MRN: [id number]   Date: 11/03/2019    DOB: 1965/12/18   STEREOTACTIC RADIOSURGERY OPERATIVE NOTE  PRE-OPERATIVE DIAGNOSIS:  Metastatic lung CA  POST-OPERATIVE DIAGNOSIS:  Same  PROCEDURE:  Stereotactic Radiosurgery  SURGEON:  Consuella Lose, MD  RADIATION ONCOLOGIST: Dr. Kyung Rudd, MD  TECHNIQUE:  The patient underwent a radiation treatment planning session in the radiation oncology simulation suite under the care of the radiation oncology physician and physicist.  I participated closely in the radiation treatment planning afterwards. The patient underwent planning CT which was fused to 3T high resolution MRI with 1 mm axial slices.  These images were fused on the planning system.  We contoured the gross target volumes and subsequently expanded this to yield the Planning Target Volume. I actively participated in the planning process.  I helped to define and review the target contours and also the contours of the optic pathway, eyes, brainstem and selected nearby organs at risk.  All the dose constraints for critical structures were reviewed and compared to AAPM Task Group 101.  The prescription dose conformity was reviewed.  I approved the plan electronically.    Accordingly, Calvin Wagner  was brought to the TrueBeam stereotactic radiation treatment linac and placed in the custom immobilization mask.  The patient was aligned according to the IR fiducial markers with BrainLab Exactrac, then orthogonal x-rays were used in ExacTrac with the 6DOF robotic table and the shifts were made to align the patient  Calvin Wagner received stereotactic radiosurgery to a prescription dose of 20Gy to the right frontal operculum lesion uneventfully.    The detailed description of the procedure is recorded in the radiation oncology procedure note.  I was present for the duration of the procedure.  DISPOSITION:   Following delivery, the patient was transported to nursing in stable  condition and monitored for possible acute effects to be discharged to home in stable condition with follow-up in one month.  Consuella Lose, MD [facility]

## 2019-11-07 ENCOUNTER — Other Ambulatory Visit: Payer: Self-pay | Admitting: Internal Medicine

## 2019-11-07 DIAGNOSIS — I2609 Other pulmonary embolism with acute cor pulmonale: Secondary | ICD-10-CM

## 2019-11-07 DIAGNOSIS — I2782 Chronic pulmonary embolism: Secondary | ICD-10-CM

## 2019-11-17 ENCOUNTER — Other Ambulatory Visit: Payer: Self-pay | Admitting: Radiation Oncology

## 2019-11-20 ENCOUNTER — Telehealth: Payer: Self-pay | Admitting: Medical Oncology

## 2019-11-20 NOTE — Telephone Encounter (Signed)
I told wife that pt is not eligble for COVID vaccine due to his age.

## 2019-11-21 ENCOUNTER — Ambulatory Visit (HOSPITAL_COMMUNITY)
Admission: RE | Admit: 2019-11-21 | Discharge: 2019-11-21 | Disposition: A | Discharge status: Home / Self Care | Payer: Medicare Other | Source: Ambulatory Visit | Attending: Internal Medicine | Admitting: Internal Medicine

## 2019-11-21 ENCOUNTER — Other Ambulatory Visit: Payer: Self-pay

## 2019-11-21 ENCOUNTER — Encounter (HOSPITAL_COMMUNITY): Payer: Self-pay

## 2019-11-21 DIAGNOSIS — C3490 Malignant neoplasm of unspecified part of unspecified bronchus or lung: Secondary | ICD-10-CM | POA: Diagnosis not present

## 2019-11-21 MED ORDER — SODIUM CHLORIDE (PF) 0.9 % IJ SOLN
INTRAMUSCULAR | Status: AC
  Filled 2019-11-21: qty 50

## 2019-11-21 MED ORDER — IOHEXOL 300 MG/ML  SOLN
100.0000 mL | Freq: Once | INTRAMUSCULAR | Status: AC | PRN
  Administered 2019-11-21: 100 mL via INTRAVENOUS

## 2019-11-21 NOTE — Progress Notes (Signed)
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  PRIOR THERAPY: 1) Stereotactic radiotherapy to a solitary brain metastasis under the care of Dr. Lisbeth Renshaw on 01/25/2017. 2) Short course of concurrent chemoradiation with weekly carboplatin for AUC of 2 and paclitaxel 45 MG/M2 to the locally advanced disease in the chest. Status post 3 cycles. 3) Systemic chemotherapy with carboplatin for AUC of 5, Alimta 500 MG/M2 and Avastin 15 MG/KG every 3 weeks. First dose of 02/15/2017. Status post 6 cycles. Last dose 06/08/2017 with stable disease. 4) whole brain irradiation under the care of Dr. Lisbeth Renshaw expected to complete on October 14, 2018. 5) Maintenance systemic chemotherapy with Alimta 500 MG/M2 in addition to Avastin 15 MG/KG every 3 weeks. First dose 07/13/2017. Status post 20 cycles. Starting from cycle #16 he will be treated with single agent Alimta only secondary to intolerance. 6) SRS to the brain lesion in the left frontal progressive mass under the care of Dr. Lisbeth Renshaw on 11/03/2019  CURRENT THERAPY: Second line treatment with immunotherapy with nivolumab 480 mg IV every 4 weeks. First dose October 26, 2018. Status post 14cycles.  INTERVAL HISTORY: Calvin Wagner 54 y.o. male returns to the clinic for a follow up visit. The patient is feeling well today without any concerning complaints except for a few dry skin patches. The patient recently had SRS to the metastatic brain lesion on November 03, 2019 and he tolerated that procedure well without any adverse effects. He has a 1 month follow up appointment with radiation  oncology in a few weeks.   The patient continues to tolerate treatment with immunotherapy well without any adverse effects except for the dry skin/scalp. He has been using OTC creams for this and shampoo. Denies any fever, chills, or weight loss. Denies any chest pain, cough, or hemoptysis. He reports his baseline shortness of breath with exertion. Denies any vomiting, diarrhea, or constipation. He occasionally experiences nausea for which he takes his prescribed anti-emetic. Denies any headache or visual changes. He is followed closely by radiation oncology and neuro oncologist Dr. Mickeal Skinner for his history of brain metastases. He recently had a restaging CT scan performed. The patient is here today for evaluation and to review his scan results prior to starting cycle # 15   MEDICAL HISTORY: Past Medical History:  Diagnosis Date  . Encounter for antineoplastic chemotherapy 12/31/2016  . GERD 11/08/2009   Qualifier: Diagnosis of  By: Ronnald Ramp MD, Arvid Right Goals of care, counseling/discussion 12/31/2016  . History of radiation therapy 01/13/2017 - 02/02/2017   Site/dose:  Left lung: 30 Gy in 15 fractions  . HYPERTENSION 11/08/2009   Qualifier: Diagnosis of  By: Ronnald Ramp MD, Arvid Right.   . 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 Migraine 02/25/2012  . Pneumonia   . PONV (postoperative nausea and vomiting)   . Rotator cuff tear, left     ALLERGIES:  has No Known Allergies.  MEDICATIONS:  Current Outpatient Medications  Medication Sig Dispense Refill  . albuterol (PROVENTIL HFA;VENTOLIN HFA) 108 (90 Base) MCG/ACT inhaler Inhale 2 puffs into the lungs every 4 (four) hours as needed.    Marland Kitchen  amLODipine (NORVASC) 5 MG tablet Take 5 mg by mouth daily.    . budesonide (RA BUDESONIDE) 32 MCG/ACT nasal spray Place into both nostrils daily.    . BUDESONIDE PO Take by mouth. Budesonide slurry    . chlorpheniramine-HYDROcodone (TUSSIONEX) 10-8 MG/5ML SUER Take 5 mLs by mouth every 12  (twelve) hours as needed for cough. 240 mL 0  . famotidine (PEPCID) 20 MG tablet Take 20 mg by mouth 2 (two) times daily.    . folic acid (FOLVITE) 1 MG tablet Take 1 tablet (1 mg total) by mouth daily. 30 tablet 4  . LORazepam (ATIVAN) 1 MG tablet Take one tab po 30 minutes prior to MRI or radiotherapy. 30 tablet 0  . losartan (COZAAR) 50 MG tablet Take 50 mg by mouth daily.    . magic mouthwash SOLN Take 5 mLs by mouth 4 (four) times daily as needed for mouth pain. 1:1:1 mix-Nystatin. Benadryl and extra strength maalox. 240 mL 0  . Multiple Vitamin (MULTIVITAMIN WITH MINERALS) TABS tablet Take 1 tablet by mouth daily.    . ondansetron (ZOFRAN) 8 MG tablet TAKE 1 TABLET BY MOUTH EVERY 8 HOURS AS NEEDED FOR NAUSEA OR VOMITING. 90 tablet 0  . pantoprazole (PROTONIX) 40 MG tablet Take 40 mg by mouth daily.  2  . PROAIR HFA 108 (90 Base) MCG/ACT inhaler Inhale 2 puffs into the lungs every 4 (four) hours as needed.  5  . prochlorperazine (COMPAZINE) 10 MG tablet TAKE 1 TABLET BY MOUTH EVERY 6 HOURS AS NEEDED FOR NAUSEA OR VOMITING 90 tablet 0  . traMADol (ULTRAM) 50 MG tablet TAKE 1 TABLET BY MOUTH EVERY 12 HOURS AS NEEDED (Patient not taking: Reported on 04/10/2019) 30 tablet 0  . XARELTO 20 MG TABS tablet TAKE 1 TABLET BY MOUTH EVERY DAY WITH SUPPER 90 tablet 0   No current facility-administered medications for this visit.   Facility-Administered Medications Ordered in Other Visits  Medication Dose Route Frequency Provider Last Rate Last Admin  . sodium chloride (PF) 0.9 % injection             SURGICAL HISTORY:  Past Surgical History:  Procedure Laterality Date  . INGUINAL HERNIA REPAIR    . SHOULDER ARTHROSCOPY WITH ROTATOR CUFF REPAIR Left 11/12/2016   Procedure: SHOULDER ARTHROSCOPY WITH ROTATOR CUFF REPAIR AND SUBACROMIAL DECOMPRESSION;  Surgeon: Tania Ade, MD;  Location: [facility];  Service: Orthopedics;  Laterality: Left;  SHOULDER ARTHROSCOPY WITH ROTATOR CUFF REPAIR AND SUBACROMIAL  DECOMPRESSION  . TONSILLECTOMY    . TOTAL KNEE ARTHROPLASTY    . VIDEO BRONCHOSCOPY Bilateral 12/10/2016   Procedure: VIDEO BRONCHOSCOPY WITHOUT FLUORO;  Surgeon: Tanda Rockers, MD;  Location: [facility];  Service: Cardiopulmonary;  Laterality: Bilateral;    REVIEW OF SYSTEMS:   Review of Systems  Constitutional: Negative for appetite change, chills, fatigue, fever and unexpected weight change.  HENT: Negative for mouth sores, nosebleeds, sore throat and trouble swallowing.  Eyes: Negative for eye problems and icterus.  Respiratory: Negative for cough, hemoptysis, shortness of breath and wheezing.   Cardiovascular: Negative for chest pain and leg swelling.  Gastrointestinal: Positive for occasional nausea. Negative for abdominal pain, constipation, diarrhea, and vomiting.  Genitourinary: Negative for bladder incontinence, difficulty urinating, dysuria, frequency and hematuria. Musculoskeletal: Negative for back pain, gait problem, neck pain and neck stiffness.  Skin: Negative for itching and rash.  Neurological: Negative for dizziness, extremity weakness, gait problem, headaches, light-headedness and seizures.  Hematological: Negative for adenopathy. Does not bruise/bleed easily.  Psychiatric/Behavioral: Negative for confusion, depression and sleep disturbance. The patient is not nervous/anxious.     PHYSICAL EXAMINATION:  There were no vitals taken for this visit.  ECOG PERFORMANCE STATUS: 1 - Symptomatic but completely ambulatory  Physical Exam  Constitutional: Oriented to person, place, and time and well-developed, well-nourished, and in no distress.  HENT:  Head: Normocephalic and atraumatic.  Mouth/Throat: Oropharynx is clear and moist. No oropharyngeal exudate.  Eyes: Conjunctivae are normal. Right eye exhibits no discharge. Left eye exhibits no discharge. No scleral icterus.  Neck: Normal range of motion. Neck supple.  Cardiovascular: Normal rate, regular rhythm, normal  heart sounds and intact distal pulses.   Pulmonary/Chest: Effort normal and breath sounds normal. No respiratory distress. No wheezes. No rales.  Abdominal: Soft. Bowel sounds are normal. Exhibits no distension and no mass. There is no tenderness.  Musculoskeletal: Normal range of motion. Exhibits no edema.  Lymphadenopathy:    No cervical adenopathy.  Neurological: Alert and oriented to person, place, and time. Exhibits normal muscle tone. Gait normal. Coordination normal.  Skin: Skin is warm and dry. No rash noted. Not diaphoretic. No erythema. No pallor.  Psychiatric: Mood, memory and judgment normal.  Vitals reviewed.  LABORATORY DATA: Lab Results  Component Value Date   WBC 4.1 10/25/2019   HGB 15.6 10/25/2019   HCT 45.2 10/25/2019   MCV 103.2 (H) 10/25/2019   PLT 211 10/25/2019      Chemistry      Component Value Date/Time   NA 140 10/25/2019 0834   NA 138 10/08/2017 0907   K 3.5 10/25/2019 0834   K 4.1 10/08/2017 0907   CL 106 10/25/2019 0834   CO2 21 (L) 10/25/2019 0834   CO2 24 10/08/2017 0907   BUN 8 10/25/2019 0834   BUN 11.7 10/08/2017 0907   CREATININE 1.21 10/25/2019 0834   CREATININE 1.2 10/08/2017 0907      Component Value Date/Time   CALCIUM 8.6 (L) 10/25/2019 0834   CALCIUM 9.5 10/08/2017 0907   ALKPHOS 89 10/25/2019 0834   ALKPHOS 88 10/08/2017 0907   AST 20 10/25/2019 0834   AST 22 10/08/2017 0907   ALT 26 10/25/2019 0834   ALT 24 10/08/2017 0907   BILITOT 0.5 10/25/2019 0834   BILITOT 0.42 10/08/2017 0907       RADIOGRAPHIC STUDIES:  CT Chest W Contrast  Result Date: 11/21/2019 CLINICAL DATA:  Non-small-cell lung cancer. Restaging. EXAM: CT CHEST, ABDOMEN, AND PELVIS WITH CONTRAST TECHNIQUE: Multidetector CT imaging of the chest, abdomen and pelvis was performed following the standard protocol during bolus administration of intravenous contrast. CONTRAST:  184mL OMNIPAQUE IOHEXOL 300 MG/ML  SOLN COMPARISON:  08/28/2019 FINDINGS: CT CHEST  FINDINGS Cardiovascular: The heart size is normal. No substantial pericardial effusion. No thoracic aortic aneurysm. Mediastinum/Nodes: No mediastinal lymphadenopathy. No right hilar lymphadenopathy 14 mm short axis left hilar node on 40/2 is stable. The esophagus has normal imaging features. There is no axillary lymphadenopathy. Lungs/Pleura: No new suspicious pulmonary nodule or mass. Post treatment changes again noted in the left hilum progression of left lower lobe atelectasis noted with interval increase in small left pleural effusion. Musculoskeletal: No worrisome lytic or sclerotic osseous abnormality. CT ABDOMEN PELVIS FINDINGS Hepatobiliary: Small area of low attenuation in the anterior liver, adjacent to the falciform ligament, is in a characteristic location for focal fatty deposition. No suspicious focal abnormality within the liver parenchyma. There is no evidence for gallstones, gallbladder wall thickening, or pericholecystic fluid. No intrahepatic or extrahepatic biliary  dilation. Pancreas: No focal mass lesion. No dilatation of the main duct. No intraparenchymal cyst. No peripancreatic edema. Spleen: No splenomegaly. No focal mass lesion. Adrenals/Urinary Tract: Right adrenal gland unremarkable. Similar appearance 18 mm left adrenal nodule. Kidneys unremarkable. No evidence for hydroureter. The urinary bladder appears normal for the degree of distention. Stomach/Bowel: Stomach is unremarkable. No gastric wall thickening. No evidence of outlet obstruction. Duodenum is normally positioned as is the ligament of Treitz. No small bowel wall thickening. No small bowel dilatation. The terminal ileum is normal. The appendix is normal. No gross colonic mass. No colonic wall thickening. Vascular/Lymphatic: There is abdominal aortic atherosclerosis without aneurysm. There is no gastrohepatic or hepatoduodenal ligament lymphadenopathy. No retroperitoneal or mesenteric lymphadenopathy. No pelvic sidewall  lymphadenopathy. Reproductive: The prostate gland and seminal vesicles are unremarkable. Other: No intraperitoneal free fluid. Musculoskeletal: Ventral mesh noted lower anterior abdominal wall. No worrisome lytic or sclerotic osseous abnormality. IMPRESSION: 1. No substantial interval change in exam. 2. Stable appearance left hilar lymphadenopathy. 3. Interval increase in small left pleural effusion with progression of left lower lobe atelectasis. 4. Stable 18 mm left adrenal nodule. Continued close attention recommended to exclude metastatic disease. 5. Aortic Atherosclerosis (ICD10-I70.0). Electronically Signed   By: Misty Stanley M.D.   On: 11/21/2019 15:02   CT Abdomen Pelvis W Contrast  Result Date: 11/21/2019 CLINICAL DATA:  Non-small-cell lung cancer. Restaging. EXAM: CT CHEST, ABDOMEN, AND PELVIS WITH CONTRAST TECHNIQUE: Multidetector CT imaging of the chest, abdomen and pelvis was performed following the standard protocol during bolus administration of intravenous contrast. CONTRAST:  142mL OMNIPAQUE IOHEXOL 300 MG/ML  SOLN COMPARISON:  08/28/2019 FINDINGS: CT CHEST FINDINGS Cardiovascular: The heart size is normal. No substantial pericardial effusion. No thoracic aortic aneurysm. Mediastinum/Nodes: No mediastinal lymphadenopathy. No right hilar lymphadenopathy 14 mm short axis left hilar node on 40/2 is stable. The esophagus has normal imaging features. There is no axillary lymphadenopathy. Lungs/Pleura: No new suspicious pulmonary nodule or mass. Post treatment changes again noted in the left hilum progression of left lower lobe atelectasis noted with interval increase in small left pleural effusion. Musculoskeletal: No worrisome lytic or sclerotic osseous abnormality. CT ABDOMEN PELVIS FINDINGS Hepatobiliary: Small area of low attenuation in the anterior liver, adjacent to the falciform ligament, is in a characteristic location for focal fatty deposition. No suspicious focal abnormality within the  liver parenchyma. There is no evidence for gallstones, gallbladder wall thickening, or pericholecystic fluid. No intrahepatic or extrahepatic biliary dilation. Pancreas: No focal mass lesion. No dilatation of the main duct. No intraparenchymal cyst. No peripancreatic edema. Spleen: No splenomegaly. No focal mass lesion. Adrenals/Urinary Tract: Right adrenal gland unremarkable. Similar appearance 18 mm left adrenal nodule. Kidneys unremarkable. No evidence for hydroureter. The urinary bladder appears normal for the degree of distention. Stomach/Bowel: Stomach is unremarkable. No gastric wall thickening. No evidence of outlet obstruction. Duodenum is normally positioned as is the ligament of Treitz. No small bowel wall thickening. No small bowel dilatation. The terminal ileum is normal. The appendix is normal. No gross colonic mass. No colonic wall thickening. Vascular/Lymphatic: There is abdominal aortic atherosclerosis without aneurysm. There is no gastrohepatic or hepatoduodenal ligament lymphadenopathy. No retroperitoneal or mesenteric lymphadenopathy. No pelvic sidewall lymphadenopathy. Reproductive: The prostate gland and seminal vesicles are unremarkable. Other: No intraperitoneal free fluid. Musculoskeletal: Ventral mesh noted lower anterior abdominal wall. No worrisome lytic or sclerotic osseous abnormality. IMPRESSION: 1. No substantial interval change in exam. 2. Stable appearance left hilar lymphadenopathy. 3. Interval increase in small left pleural  effusion with progression of left lower lobe atelectasis. 4. Stable 18 mm left adrenal nodule. Continued close attention recommended to exclude metastatic disease. 5. Aortic Atherosclerosis (ICD10-I70.0). Electronically Signed   By: Misty Stanley M.D.   On: 11/21/2019 15:02     ASSESSMENT/PLAN:  This is a very pleasant 54 year old Caucasian male with stage IV non-small cell lung cancer, high-grade neuroendocrine carcinoma. He presented with locally  advanced disease in the chest,a solitary brain metastasis,and 2 liver lesions. The patient was diagnosed in March 2018.  The patient is status post stereotactic radiotherapy to the solitary brain metastasisaswell with a short course of concurrent chemoradiation to the locally advanced disease in the chest.  He then underwent 6 cycles of systemic chemotherapy with carboplatin, Alimta, and Avastin. He tolerated treatment well. He was then started on maintenance treatment with Alimta and Avastin. He is status post 15 cycles. Avastin was discontinued due to intolerance withcombination therapy. He then completed 5 more cycles of single agent Alimta. This was discontinued due to developing multiple metastatic brain lesions and progression of the liver lesion.  He thencompleted whole brain irradiation.  He is currently being treated with nivolumab 480 mg IV every 4 weeks. He is status post14 cycles and is tolerating it well without any adverse effects.  He recently underwent SRS to the lesion in the left frontal lobe on 11/03/2019.   The patient recently had a restaging CT scan performed. Dr. Julien Nordmann personally and independently reviewed the scan and discussed the results with the patient today. The scan showed no evidence of disease progression.   Dr. Julien Nordmann recommends that he continue on the same treatment at the same dose. He will receive cycle #15 today as scheduled.   We will see him back for a follow up visit in 4 weeks for evaluation before starting cycle #16.   For his small dry skin patches, he was advised to use topical hydrocortisone cream. For his dry itchy scalp, recommend that he use selsun blue or head and shoulders.   The patient was inquiring about refills for his inhalers, discussed that he should reach out to his pulmonologist or PCP who prescribe these medications.   The patient was advised to call immediately if he has any concerning symptoms in the  interval. The patient voices understanding of current disease status and treatment options and is in agreement with the current care plan. All questions were answered. The patient knows to call the clinic with any problems, questions or concerns. We can certainly see the patient much sooner if necessary    No orders of the defined types were placed in this encounter.    Sierra Spargo L Korbin Mapps, PA-C 11/21/19  ADDENDUM: Hematology/Oncology Attending: I had a face-to-face encounter with the patient today.  I recommended his care plan.  He was accompanied by his wife during the visit.  This is a very pleasant 54 years old white male with metastatic non-small cell lung cancer, high-grade neuroendocrine carcinoma status post several chemotherapy regimens in addition to SBRT to brain metastasis.  The patient is currently undergoing treatment with immunotherapy with nivolumab 480 mg IV every 4 weeks status post 14 cycles.  He has been tolerating this treatment well with no significant adverse effects. He had repeat CT scan of the chest, abdomen pelvis performed recently.  I personally and independently reviewed the scans and discussed the results with the patient and his wife today. His scan showed no concerning findings for disease progression but there was development of small  left pleural effusion that need close monitoring on upcoming imaging studies or if the patient becomes symptomatic. I recommended for the patient to continue his current treatment with nivolumab and he will proceed with cycle #15 today. He will come back for follow-up visit in 4 weeks for evaluation before the next cycle of his treatment. The patient was advised to call immediately if he has any concerning symptoms in the interval.  Disclaimer: This note was dictated with voice recognition software. Similar sounding words can inadvertently be transcribed and may be missed upon review. Eilleen Kempf, MD 11/22/19

## 2019-11-22 ENCOUNTER — Other Ambulatory Visit: Payer: Self-pay

## 2019-11-22 ENCOUNTER — Encounter: Payer: Self-pay | Admitting: Physician Assistant

## 2019-11-22 ENCOUNTER — Inpatient Hospital Stay: Payer: Medicare Other | Attending: Internal Medicine | Admitting: Physician Assistant

## 2019-11-22 ENCOUNTER — Inpatient Hospital Stay: Payer: Medicare Other

## 2019-11-22 VITALS — HR 99

## 2019-11-22 VITALS — BP 131/95 | HR 103 | Temp 98.7°F | Resp 16 | Ht 73.0 in | Wt 250.8 lb

## 2019-11-22 DIAGNOSIS — I10 Essential (primary) hypertension: Secondary | ICD-10-CM | POA: Diagnosis not present

## 2019-11-22 DIAGNOSIS — R110 Nausea: Secondary | ICD-10-CM | POA: Diagnosis not present

## 2019-11-22 DIAGNOSIS — C3432 Malignant neoplasm of lower lobe, left bronchus or lung: Secondary | ICD-10-CM | POA: Diagnosis present

## 2019-11-22 DIAGNOSIS — Z79899 Other long term (current) drug therapy: Secondary | ICD-10-CM | POA: Insufficient documentation

## 2019-11-22 DIAGNOSIS — Z23 Encounter for immunization: Secondary | ICD-10-CM | POA: Diagnosis not present

## 2019-11-22 DIAGNOSIS — C3492 Malignant neoplasm of unspecified part of left bronchus or lung: Secondary | ICD-10-CM

## 2019-11-22 DIAGNOSIS — C7931 Secondary malignant neoplasm of brain: Secondary | ICD-10-CM | POA: Insufficient documentation

## 2019-11-22 DIAGNOSIS — K219 Gastro-esophageal reflux disease without esophagitis: Secondary | ICD-10-CM | POA: Diagnosis not present

## 2019-11-22 DIAGNOSIS — Z923 Personal history of irradiation: Secondary | ICD-10-CM | POA: Diagnosis not present

## 2019-11-22 DIAGNOSIS — Z5112 Encounter for antineoplastic immunotherapy: Secondary | ICD-10-CM | POA: Diagnosis present

## 2019-11-22 DIAGNOSIS — L853 Xerosis cutis: Secondary | ICD-10-CM

## 2019-11-22 DIAGNOSIS — Z7901 Long term (current) use of anticoagulants: Secondary | ICD-10-CM | POA: Diagnosis not present

## 2019-11-22 DIAGNOSIS — R5383 Other fatigue: Secondary | ICD-10-CM

## 2019-11-22 DIAGNOSIS — Z0000 Encounter for general adult medical examination without abnormal findings: Secondary | ICD-10-CM

## 2019-11-22 LAB — CMP (CANCER CENTER ONLY)
ALT: 24 U/L (ref 0–44)
AST: 16 U/L (ref 15–41)
Albumin: 3.7 g/dL (ref 3.5–5.0)
Alkaline Phosphatase: 87 U/L (ref 38–126)
Anion gap: 10 (ref 5–15)
BUN: 7 mg/dL (ref 6–20)
CO2: 28 mmol/L (ref 22–32)
Calcium: 9.1 mg/dL (ref 8.9–10.3)
Chloride: 103 mmol/L (ref 98–111)
Creatinine: 1.28 mg/dL — ABNORMAL HIGH (ref 0.61–1.24)
GFR, Est AFR Am: >60 mL/min (ref >60)
GFR, Estimated: >60 mL/min (ref >60)
Glucose, Bld: 172 mg/dL — ABNORMAL HIGH (ref 70–99)
Potassium: 3.8 mmol/L (ref 3.5–5.1)
Sodium: 141 mmol/L (ref 135–145)
Total Bilirubin: 0.5 mg/dL (ref 0.3–1.2)
Total Protein: 7 g/dL (ref 6.5–8.1)

## 2019-11-22 LAB — CBC WITH DIFFERENTIAL (CANCER CENTER ONLY)
Abs Immature Granulocytes: 0.01 10*3/uL (ref 0.00–0.07)
Basophils Absolute: 0 10*3/uL (ref 0.0–0.1)
Basophils Relative: 1 %
Eosinophils Absolute: 0.2 10*3/uL (ref 0.0–0.5)
Eosinophils Relative: 4 %
HCT: 48.5 % (ref 39.0–52.0)
Hemoglobin: 16.2 g/dL (ref 13.0–17.0)
Immature Granulocytes: 0 %
Lymphocytes Relative: 20 %
Lymphs Abs: 0.9 10*3/uL (ref 0.7–4.0)
MCH: 35.8 pg — ABNORMAL HIGH (ref 26.0–34.0)
MCHC: 33.4 g/dL (ref 30.0–36.0)
MCV: 107.1 fL — ABNORMAL HIGH (ref 80.0–100.0)
Monocytes Absolute: 0.3 10*3/uL (ref 0.1–1.0)
Monocytes Relative: 8 %
Neutro Abs: 2.9 10*3/uL (ref 1.7–7.7)
Neutrophils Relative %: 67 %
Platelet Count: 194 10*3/uL (ref 150–400)
RBC: 4.53 MIL/uL (ref 4.22–5.81)
RDW: 13.1 % (ref 11.5–15.5)
WBC Count: 4.4 10*3/uL (ref 4.0–10.5)
nRBC: 0 % (ref 0.0–0.2)

## 2019-11-22 LAB — TSH: TSH: 1.945 u[IU]/mL (ref 0.320–4.118)

## 2019-11-22 MED ORDER — SODIUM CHLORIDE 0.9 % IV SOLN
Freq: Once | INTRAVENOUS | Status: AC
  Filled 2019-11-22: qty 250

## 2019-11-22 MED ORDER — INFLUENZA VAC SPLIT QUAD 0.5 ML IM SUSY
0.5000 mL | PREFILLED_SYRINGE | Freq: Once | INTRAMUSCULAR | Status: AC
  Administered 2019-11-22: 0.5 mL via INTRAMUSCULAR

## 2019-11-22 MED ORDER — SODIUM CHLORIDE 0.9 % IV SOLN
480.0000 mg | Freq: Once | INTRAVENOUS | Status: AC
  Administered 2019-11-22: 13:00:00 480 mg via INTRAVENOUS
  Filled 2019-11-22: qty 48

## 2019-11-22 MED ORDER — INFLUENZA VAC SPLIT QUAD 0.5 ML IM SUSY
PREFILLED_SYRINGE | INTRAMUSCULAR | Status: AC
  Filled 2019-11-22: qty 0.5

## 2019-11-23 ENCOUNTER — Telehealth: Payer: Self-pay | Admitting: Internal Medicine

## 2019-11-23 NOTE — Telephone Encounter (Signed)
Scheduled per los. Called and left msg. Mailed priintout

## 2019-12-04 ENCOUNTER — Ambulatory Visit
Admission: RE | Admit: 2019-12-04 | Discharge: 2019-12-04 | Disposition: A | Discharge status: Home / Self Care | Payer: Medicare Other | Source: Ambulatory Visit | Attending: Radiation Oncology | Admitting: Radiation Oncology

## 2019-12-04 ENCOUNTER — Encounter: Payer: Self-pay | Admitting: Radiation Oncology

## 2019-12-04 ENCOUNTER — Other Ambulatory Visit: Payer: Self-pay

## 2019-12-04 DIAGNOSIS — M6208 Separation of muscle (nontraumatic), other site: Secondary | ICD-10-CM | POA: Insufficient documentation

## 2019-12-04 DIAGNOSIS — K429 Umbilical hernia without obstruction or gangrene: Secondary | ICD-10-CM | POA: Insufficient documentation

## 2019-12-04 DIAGNOSIS — C7931 Secondary malignant neoplasm of brain: Secondary | ICD-10-CM

## 2019-12-04 DIAGNOSIS — K200 Eosinophilic esophagitis: Secondary | ICD-10-CM | POA: Insufficient documentation

## 2019-12-04 DIAGNOSIS — R1310 Dysphagia, unspecified: Secondary | ICD-10-CM | POA: Insufficient documentation

## 2019-12-04 DIAGNOSIS — H9319 Tinnitus, unspecified ear: Secondary | ICD-10-CM | POA: Insufficient documentation

## 2019-12-04 DIAGNOSIS — E785 Hyperlipidemia, unspecified: Secondary | ICD-10-CM | POA: Insufficient documentation

## 2019-12-04 NOTE — Progress Notes (Signed)
  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  Interval Since Last Radiation: 5 weeks   11/03/19 SRS Treatment: PTV2, left Frontal 6 mm target was treated to 20 Gy in 1 fraction  09/29/2018 - 10/14/2018: Whole Brain / 30 Gy in 10 fractions  01/13/17 - 02/02/17: Left lung: 30 Gy in 15 fractions  01/25/17 SRS Treatment: PTV1Left Frontal 25mm: 20 Gy in 1 fraction   Narrative:  The patient was contacted today for routine follow-up. During treatment he did very well with radiotherapy and did not have significant desquamation. He reports he is doing well overall.  Impression/Plan: 1. 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 The patient has been doing well since completion of radiotherapy. We discussed that we would be happy to continue to follow him as needed, but he will also continue to follow up with Dr. Julien Nordmann in medical oncology and continues immunotherapy. He will also follow up with Dr. Mickeal Skinner for his next interval imaging of the brain.    Carola Rhine, PAC

## 2019-12-05 ENCOUNTER — Other Ambulatory Visit: Payer: Self-pay | Admitting: *Deleted

## 2019-12-05 ENCOUNTER — Telehealth: Payer: Self-pay | Admitting: Internal Medicine

## 2019-12-05 DIAGNOSIS — C3492 Malignant neoplasm of unspecified part of left bronchus or lung: Secondary | ICD-10-CM

## 2019-12-05 NOTE — Telephone Encounter (Signed)
Scheduled 4/29 appt per 2/23 sch msg. I left pt a voice mail with appt details. I also mailed a reminder letter and calendar.

## 2019-12-13 ENCOUNTER — Other Ambulatory Visit: Payer: Self-pay | Admitting: Radiation Therapy

## 2019-12-15 NOTE — Progress Notes (Signed)
  Radiation Oncology         [phone] ________________________________  Name: CORDELL GUERCIO MRN: [id number]  Date: 10/30/2019  DOB: May 25, 1966  DIAGNOSIS:     ICD-10-CM   1. Brain metastasis ([place])  C79.31     NARRATIVE:  The patient was brought to the [facility].  Identity was confirmed.  All relevant records and images related to the planned course of therapy were reviewed.  The patient freely provided informed written consent to proceed with treatment after reviewing the details related to the planned course of therapy. The consent form was witnessed and verified by the simulation staff. Intravenous access was established for contrast administration. Then, the patient was set-up in a stable reproducible supine position for radiation therapy.  A relocatable thermoplastic stereotactic head frame was fabricated for precise immobilization.  CT images were obtained.  Surface markings were placed.  The CT images were loaded into the planning software and fused with the patient's targeting MRI scan.  Then the target and avoidance structures were contoured.  Treatment planning then occurred.  The radiation prescription was entered and confirmed.  I have requested 3D planning  I have requested a DVH of the following structures: Brain stem, brain, left eye, right eye, lenses, optic chiasm, target volumes, uninvolved brain, and normal tissue.    SPECIAL TREATMENT PROCEDURE:  The planned course of therapy using radiation constitutes a special treatment procedure. Special care is required in the management of this patient for the following reasons. This treatment constitutes a Special Treatment Procedure for the following reason: High dose per fraction requiring special monitoring for increased toxicities of treatment including daily imaging.  The special nature of the planned course of radiotherapy will require increased physician supervision and oversight to ensure patient's safety with  optimal treatment outcomes.  PLAN:  The patient will receive 20 Gy in 1 fraction.   ------------------------------------------------  Jodelle Gross, MD, PhD

## 2019-12-18 ENCOUNTER — Telehealth: Payer: Self-pay | Admitting: Internal Medicine

## 2019-12-18 NOTE — Telephone Encounter (Signed)
Called pt per 3/8 sch message - no answer  - left message for patient to call back to reschedule.

## 2019-12-20 ENCOUNTER — Inpatient Hospital Stay: Payer: Medicare Other | Attending: Internal Medicine | Admitting: Internal Medicine

## 2019-12-20 ENCOUNTER — Inpatient Hospital Stay: Payer: Medicare Other

## 2019-12-20 ENCOUNTER — Encounter: Payer: Self-pay | Admitting: Internal Medicine

## 2019-12-20 ENCOUNTER — Other Ambulatory Visit: Payer: Self-pay

## 2019-12-20 VITALS — BP 127/94 | HR 116 | Temp 97.8°F | Resp 20 | Ht 73.0 in | Wt 251.4 lb

## 2019-12-20 DIAGNOSIS — C3492 Malignant neoplasm of unspecified part of left bronchus or lung: Secondary | ICD-10-CM

## 2019-12-20 DIAGNOSIS — C787 Secondary malignant neoplasm of liver and intrahepatic bile duct: Secondary | ICD-10-CM | POA: Diagnosis not present

## 2019-12-20 DIAGNOSIS — Z5112 Encounter for antineoplastic immunotherapy: Secondary | ICD-10-CM | POA: Diagnosis present

## 2019-12-20 DIAGNOSIS — I10 Essential (primary) hypertension: Secondary | ICD-10-CM | POA: Insufficient documentation

## 2019-12-20 DIAGNOSIS — R05 Cough: Secondary | ICD-10-CM | POA: Diagnosis not present

## 2019-12-20 DIAGNOSIS — R0602 Shortness of breath: Secondary | ICD-10-CM | POA: Insufficient documentation

## 2019-12-20 DIAGNOSIS — R5383 Other fatigue: Secondary | ICD-10-CM

## 2019-12-20 DIAGNOSIS — Z79899 Other long term (current) drug therapy: Secondary | ICD-10-CM | POA: Diagnosis not present

## 2019-12-20 DIAGNOSIS — K219 Gastro-esophageal reflux disease without esophagitis: Secondary | ICD-10-CM | POA: Diagnosis not present

## 2019-12-20 DIAGNOSIS — Z8249 Family history of ischemic heart disease and other diseases of the circulatory system: Secondary | ICD-10-CM | POA: Insufficient documentation

## 2019-12-20 DIAGNOSIS — R110 Nausea: Secondary | ICD-10-CM | POA: Diagnosis not present

## 2019-12-20 DIAGNOSIS — R569 Unspecified convulsions: Secondary | ICD-10-CM | POA: Diagnosis not present

## 2019-12-20 DIAGNOSIS — C7931 Secondary malignant neoplasm of brain: Secondary | ICD-10-CM | POA: Diagnosis present

## 2019-12-20 DIAGNOSIS — Z7901 Long term (current) use of anticoagulants: Secondary | ICD-10-CM | POA: Diagnosis not present

## 2019-12-20 DIAGNOSIS — Z801 Family history of malignant neoplasm of trachea, bronchus and lung: Secondary | ICD-10-CM | POA: Diagnosis not present

## 2019-12-20 DIAGNOSIS — Z7952 Long term (current) use of systemic steroids: Secondary | ICD-10-CM | POA: Diagnosis not present

## 2019-12-20 DIAGNOSIS — Z923 Personal history of irradiation: Secondary | ICD-10-CM | POA: Diagnosis not present

## 2019-12-20 DIAGNOSIS — C3432 Malignant neoplasm of lower lobe, left bronchus or lung: Secondary | ICD-10-CM | POA: Insufficient documentation

## 2019-12-20 LAB — CMP (CANCER CENTER ONLY)
ALT: 19 U/L (ref 0–44)
AST: 17 U/L (ref 15–41)
Albumin: 3.7 g/dL (ref 3.5–5.0)
Alkaline Phosphatase: 81 U/L (ref 38–126)
Anion gap: 8 (ref 5–15)
BUN: 9 mg/dL (ref 6–20)
CO2: 26 mmol/L (ref 22–32)
Calcium: 9 mg/dL (ref 8.9–10.3)
Chloride: 105 mmol/L (ref 98–111)
Creatinine: 1.22 mg/dL (ref 0.61–1.24)
GFR, Est AFR Am: >60 mL/min (ref >60)
GFR, Estimated: >60 mL/min (ref >60)
Glucose, Bld: 159 mg/dL — ABNORMAL HIGH (ref 70–99)
Potassium: 3.8 mmol/L (ref 3.5–5.1)
Sodium: 139 mmol/L (ref 135–145)
Total Bilirubin: 0.6 mg/dL (ref 0.3–1.2)
Total Protein: 6.9 g/dL (ref 6.5–8.1)

## 2019-12-20 LAB — CBC WITH DIFFERENTIAL (CANCER CENTER ONLY)
Abs Immature Granulocytes: 0.01 10*3/uL (ref 0.00–0.07)
Basophils Absolute: 0 10*3/uL (ref 0.0–0.1)
Basophils Relative: 1 %
Eosinophils Absolute: 0.2 10*3/uL (ref 0.0–0.5)
Eosinophils Relative: 4 %
HCT: 48.5 % (ref 39.0–52.0)
Hemoglobin: 16.6 g/dL (ref 13.0–17.0)
Immature Granulocytes: 0 %
Lymphocytes Relative: 24 %
Lymphs Abs: 1 10*3/uL (ref 0.7–4.0)
MCH: 35.5 pg — ABNORMAL HIGH (ref 26.0–34.0)
MCHC: 34.2 g/dL (ref 30.0–36.0)
MCV: 103.6 fL — ABNORMAL HIGH (ref 80.0–100.0)
Monocytes Absolute: 0.4 10*3/uL (ref 0.1–1.0)
Monocytes Relative: 8 %
Neutro Abs: 2.8 10*3/uL (ref 1.7–7.7)
Neutrophils Relative %: 63 %
Platelet Count: 168 10*3/uL (ref 150–400)
RBC: 4.68 MIL/uL (ref 4.22–5.81)
RDW: 12.5 % (ref 11.5–15.5)
WBC Count: 4.4 10*3/uL (ref 4.0–10.5)
nRBC: 0 % (ref 0.0–0.2)

## 2019-12-20 LAB — TSH: TSH: 2.892 u[IU]/mL (ref 0.320–4.118)

## 2019-12-20 MED ORDER — SODIUM CHLORIDE 0.9 % IV SOLN
480.0000 mg | Freq: Once | INTRAVENOUS | Status: AC
  Administered 2019-12-20: 480 mg via INTRAVENOUS
  Filled 2019-12-20: qty 48

## 2019-12-20 MED ORDER — SODIUM CHLORIDE 0.9 % IV SOLN
Freq: Once | INTRAVENOUS | Status: AC
  Filled 2019-12-20: qty 250

## 2019-12-20 NOTE — Progress Notes (Signed)
[facility] [phone]   [phone]  OFFICE PROGRESS NOTE  [name], MD 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  PRIOR THERAPY:  1) Stereotactic radiotherapy to a solitary brain metastasis under the care of Dr. Lisbeth Renshaw on 01/25/2017. 2) Short course of concurrent chemoradiation with weekly carboplatin for AUC of 2 and paclitaxel 45 MG/M2 to the locally advanced disease in the chest. Status post 3 cycles. 3) Systemic chemotherapy with carboplatin for AUC of 5, Alimta 500 MG/M2 and Avastin 15 MG/KG every 3 weeks. First dose of 02/15/2017. Status post 6 cycles. Last dose 06/08/2017 with stable disease. 4) whole brain irradiation under the care of Dr. Lisbeth Renshaw expected to complete on October 14, 2018. 5) Maintenance systemic chemotherapy with Alimta 500 MG/M2 in addition to Avastin 15 MG/KG every 3 weeks. First dose 07/13/2017.  Status post 20 cycles.  Starting from cycle #16 he will be treated with single agent Alimta only secondary to intolerance.   CURRENT THERAPY: Second line treatment with immunotherapy with nivolumab 480 mg IV every 4 weeks.  First dose October 26, 2018.  Status post 15 cycles.  INTERVAL HISTORY: LOGAN BALTIMORE 54 y.o. male returns to the clinic today for follow-up visit.  The patient is feeling fine today with no concerning complaints except for mild shortness of breath with exertion and occasional nausea.  He denied having any chest pain, cough or hemoptysis.  He denied having any vomiting, diarrhea or constipation.  He has no significant weight loss or night sweats.  He has no fever or chills.  He continues to  tolerate his treatment with nivolumab fairly well.  Is here today for evaluation before starting cycle #16.   MEDICAL HISTORY: Past Medical History:  Diagnosis Date  . Encounter for antineoplastic chemotherapy 12/31/2016  . GERD 11/08/2009   Qualifier: Diagnosis of  By: Ronnald Ramp MD, Arvid Right Goals of care, counseling/discussion 12/31/2016  . History of radiation therapy 01/13/2017 - 02/02/2017   Site/dose:  Left lung: 30 Gy in 15 fractions  . HYPERTENSION 11/08/2009   Qualifier: Diagnosis of  By: Ronnald Ramp MD, Arvid Right.   . 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 Migraine 02/25/2012  . Pneumonia   . PONV (postoperative nausea and vomiting)   . Rotator cuff tear, left     ALLERGIES:  has No Known Allergies.  MEDICATIONS:  Current Outpatient Medications  Medication Sig Dispense Refill  . albuterol (PROVENTIL HFA;VENTOLIN HFA) 108 (90 Base) MCG/ACT inhaler Inhale 2 puffs into the lungs every 4 (four) hours as needed.    Marland Kitchen amLODipine (NORVASC) 5 MG tablet Take 5 mg by mouth daily.    . budesonide (RA BUDESONIDE) 32 MCG/ACT nasal spray Place into both nostrils daily.    . BUDESONIDE PO Take by mouth. Budesonide slurry    . chlorpheniramine-HYDROcodone (TUSSIONEX) 10-8 MG/5ML SUER Take 5 mLs by mouth every 12 (twelve) hours as needed for cough. 240 mL 0  . famotidine (PEPCID) 20 MG tablet Take 20 mg by mouth 2 (two) times daily.    . folic acid (FOLVITE) 1 MG tablet Take 1 tablet (1 mg total) by mouth daily. 30 tablet  4  . LORazepam (ATIVAN) 1 MG tablet Take one tab po 30 minutes prior to MRI or radiotherapy. 30 tablet 0  . losartan (COZAAR) 50 MG tablet Take 50 mg by mouth daily.    . magic mouthwash SOLN Take 5 mLs by mouth 4 (four) times daily as needed for mouth pain. 1:1:1 mix-Nystatin. Benadryl and extra strength maalox. 240 mL 0  . Multiple Vitamin (MULTIVITAMIN WITH MINERALS) TABS tablet Take 1 tablet by mouth daily.    . ondansetron (ZOFRAN) 8 MG tablet TAKE 1  TABLET BY MOUTH EVERY 8 HOURS AS NEEDED FOR NAUSEA OR VOMITING. 90 tablet 0  . pantoprazole (PROTONIX) 40 MG tablet Take 40 mg by mouth daily.  2  . PROAIR HFA 108 (90 Base) MCG/ACT inhaler Inhale 2 puffs into the lungs every 4 (four) hours as needed.  5  . prochlorperazine (COMPAZINE) 10 MG tablet TAKE 1 TABLET BY MOUTH EVERY 6 HOURS AS NEEDED FOR NAUSEA OR VOMITING 90 tablet 0  . traMADol (ULTRAM) 50 MG tablet TAKE 1 TABLET BY MOUTH EVERY 12 HOURS AS NEEDED 30 tablet 0  . XARELTO 20 MG TABS tablet TAKE 1 TABLET BY MOUTH EVERY DAY WITH SUPPER 90 tablet 0   No current facility-administered medications for this visit.    SURGICAL HISTORY:  Past Surgical History:  Procedure Laterality Date  . INGUINAL HERNIA REPAIR    . SHOULDER ARTHROSCOPY WITH ROTATOR CUFF REPAIR Left 11/12/2016   Procedure: SHOULDER ARTHROSCOPY WITH ROTATOR CUFF REPAIR AND SUBACROMIAL DECOMPRESSION;  Surgeon: Tania Ade, MD;  Location: [facility];  Service: Orthopedics;  Laterality: Left;  SHOULDER ARTHROSCOPY WITH ROTATOR CUFF REPAIR AND SUBACROMIAL DECOMPRESSION  . TONSILLECTOMY    . TOTAL KNEE ARTHROPLASTY    . VIDEO BRONCHOSCOPY Bilateral 12/10/2016   Procedure: VIDEO BRONCHOSCOPY WITHOUT FLUORO;  Surgeon: Tanda Rockers, MD;  Location: [facility];  Service: Cardiopulmonary;  Laterality: Bilateral;    REVIEW OF SYSTEMS:  A comprehensive review of systems was negative except for: Respiratory: positive for dyspnea on exertion Gastrointestinal: positive for nausea   PHYSICAL EXAMINATION: General appearance: alert, cooperative and no distress Head: Normocephalic, without obvious abnormality, atraumatic Neck: no adenopathy, no JVD, supple, symmetrical, trachea midline and thyroid not enlarged, symmetric, no tenderness/mass/nodules Lymph nodes: Cervical, supraclavicular, and axillary nodes normal. Resp: clear to auscultation bilaterally Back: symmetric, no curvature. ROM normal. No CVA tenderness. Cardio: regular rate and  rhythm, S1, S2 normal, no murmur, click, rub or gallop GI: soft, non-tender; bowel sounds normal; no masses,  no organomegaly Extremities: extremities normal, atraumatic, no cyanosis or edema  ECOG PERFORMANCE STATUS: 1 - Symptomatic but completely ambulatory  Blood pressure (!) 127/94, pulse (!) 116, temperature 97.8 F (36.6 C), temperature source Oral, resp. rate 20, height 6\' 1"  (1.854 m), weight 251 lb 6.4 oz (114 kg), SpO2 100 %.  LABORATORY DATA: Lab Results  Component Value Date   WBC 4.4 12/20/2019   HGB 16.6 12/20/2019   HCT 48.5 12/20/2019   MCV 103.6 (H) 12/20/2019   PLT 168 12/20/2019      Chemistry      Component Value Date/Time   NA 141 11/22/2019 1120   NA 138 10/08/2017 0907   K 3.8 11/22/2019 1120   K 4.1 10/08/2017 0907   CL 103 11/22/2019 1120   CO2 28 11/22/2019 1120   CO2 24 10/08/2017 0907   BUN 7 11/22/2019 1120   BUN 11.7 10/08/2017 0907   CREATININE 1.28 (H) 11/22/2019 1120   CREATININE 1.2 10/08/2017  1443      Component Value Date/Time   CALCIUM 9.1 11/22/2019 1120   CALCIUM 9.5 10/08/2017 0907   ALKPHOS 87 11/22/2019 1120   ALKPHOS 88 10/08/2017 0907   AST 16 11/22/2019 1120   AST 22 10/08/2017 0907   ALT 24 11/22/2019 1120   ALT 24 10/08/2017 0907   BILITOT 0.5 11/22/2019 1120   BILITOT 0.42 10/08/2017 1540       RADIOGRAPHIC STUDIES: CT Chest W Contrast  Result Date: 11/21/2019 CLINICAL DATA:  Non-small-cell lung cancer. Restaging. EXAM: CT CHEST, ABDOMEN, AND PELVIS WITH CONTRAST TECHNIQUE: Multidetector CT imaging of the chest, abdomen and pelvis was performed following the standard protocol during bolus administration of intravenous contrast. CONTRAST:  157mL OMNIPAQUE IOHEXOL 300 MG/ML  SOLN COMPARISON:  08/28/2019 FINDINGS: CT CHEST FINDINGS Cardiovascular: The heart size is normal. No substantial pericardial effusion. No thoracic aortic aneurysm. Mediastinum/Nodes: No mediastinal lymphadenopathy. No right hilar lymphadenopathy 14  mm short axis left hilar node on 40/2 is stable. The esophagus has normal imaging features. There is no axillary lymphadenopathy. Lungs/Pleura: No new suspicious pulmonary nodule or mass. Post treatment changes again noted in the left hilum progression of left lower lobe atelectasis noted with interval increase in small left pleural effusion. Musculoskeletal: No worrisome lytic or sclerotic osseous abnormality. CT ABDOMEN PELVIS FINDINGS Hepatobiliary: Small area of low attenuation in the anterior liver, adjacent to the falciform ligament, is in a characteristic location for focal fatty deposition. No suspicious focal abnormality within the liver parenchyma. There is no evidence for gallstones, gallbladder wall thickening, or pericholecystic fluid. No intrahepatic or extrahepatic biliary dilation. Pancreas: No focal mass lesion. No dilatation of the main duct. No intraparenchymal cyst. No peripancreatic edema. Spleen: No splenomegaly. No focal mass lesion. Adrenals/Urinary Tract: Right adrenal gland unremarkable. Similar appearance 18 mm left adrenal nodule. Kidneys unremarkable. No evidence for hydroureter. The urinary bladder appears normal for the degree of distention. Stomach/Bowel: Stomach is unremarkable. No gastric wall thickening. No evidence of outlet obstruction. Duodenum is normally positioned as is the ligament of Treitz. No small bowel wall thickening. No small bowel dilatation. The terminal ileum is normal. The appendix is normal. No gross colonic mass. No colonic wall thickening. Vascular/Lymphatic: There is abdominal aortic atherosclerosis without aneurysm. There is no gastrohepatic or hepatoduodenal ligament lymphadenopathy. No retroperitoneal or mesenteric lymphadenopathy. No pelvic sidewall lymphadenopathy. Reproductive: The prostate gland and seminal vesicles are unremarkable. Other: No intraperitoneal free fluid. Musculoskeletal: Ventral mesh noted lower anterior abdominal wall. No worrisome  lytic or sclerotic osseous abnormality. IMPRESSION: 1. No substantial interval change in exam. 2. Stable appearance left hilar lymphadenopathy. 3. Interval increase in small left pleural effusion with progression of left lower lobe atelectasis. 4. Stable 18 mm left adrenal nodule. Continued close attention recommended to exclude metastatic disease. 5. Aortic Atherosclerosis (ICD10-I70.0). Electronically Signed   By: Misty Stanley M.D.   On: 11/21/2019 15:02   CT Abdomen Pelvis W Contrast  Result Date: 11/21/2019 CLINICAL DATA:  Non-small-cell lung cancer. Restaging. EXAM: CT CHEST, ABDOMEN, AND PELVIS WITH CONTRAST TECHNIQUE: Multidetector CT imaging of the chest, abdomen and pelvis was performed following the standard protocol during bolus administration of intravenous contrast. CONTRAST:  160mL OMNIPAQUE IOHEXOL 300 MG/ML  SOLN COMPARISON:  08/28/2019 FINDINGS: CT CHEST FINDINGS Cardiovascular: The heart size is normal. No substantial pericardial effusion. No thoracic aortic aneurysm. Mediastinum/Nodes: No mediastinal lymphadenopathy. No right hilar lymphadenopathy 14 mm short axis left hilar node on 40/2 is stable. The esophagus has normal imaging features. There  is no axillary lymphadenopathy. Lungs/Pleura: No new suspicious pulmonary nodule or mass. Post treatment changes again noted in the left hilum progression of left lower lobe atelectasis noted with interval increase in small left pleural effusion. Musculoskeletal: No worrisome lytic or sclerotic osseous abnormality. CT ABDOMEN PELVIS FINDINGS Hepatobiliary: Small area of low attenuation in the anterior liver, adjacent to the falciform ligament, is in a characteristic location for focal fatty deposition. No suspicious focal abnormality within the liver parenchyma. There is no evidence for gallstones, gallbladder wall thickening, or pericholecystic fluid. No intrahepatic or extrahepatic biliary dilation. Pancreas: No focal mass lesion. No dilatation of  the main duct. No intraparenchymal cyst. No peripancreatic edema. Spleen: No splenomegaly. No focal mass lesion. Adrenals/Urinary Tract: Right adrenal gland unremarkable. Similar appearance 18 mm left adrenal nodule. Kidneys unremarkable. No evidence for hydroureter. The urinary bladder appears normal for the degree of distention. Stomach/Bowel: Stomach is unremarkable. No gastric wall thickening. No evidence of outlet obstruction. Duodenum is normally positioned as is the ligament of Treitz. No small bowel wall thickening. No small bowel dilatation. The terminal ileum is normal. The appendix is normal. No gross colonic mass. No colonic wall thickening. Vascular/Lymphatic: There is abdominal aortic atherosclerosis without aneurysm. There is no gastrohepatic or hepatoduodenal ligament lymphadenopathy. No retroperitoneal or mesenteric lymphadenopathy. No pelvic sidewall lymphadenopathy. Reproductive: The prostate gland and seminal vesicles are unremarkable. Other: No intraperitoneal free fluid. Musculoskeletal: Ventral mesh noted lower anterior abdominal wall. No worrisome lytic or sclerotic osseous abnormality. IMPRESSION: 1. No substantial interval change in exam. 2. Stable appearance left hilar lymphadenopathy. 3. Interval increase in small left pleural effusion with progression of left lower lobe atelectasis. 4. Stable 18 mm left adrenal nodule. Continued close attention recommended to exclude metastatic disease. 5. Aortic Atherosclerosis (ICD10-I70.0). Electronically Signed   By: Misty Stanley M.D.   On: 11/21/2019 15:02    ASSESSMENT AND PLAN: This is a very pleasant 54 years old white male with stage IV non-small cell lung cancer, high-grade neuroendocrine carcinoma presented with locally advanced disease in the chest as well as solitary brain metastasis and 2 liver lesions. The patient is status post stereotactic radiotherapy to the solitary brain metastasis as well as short course of concurrent  chemoradiation to the locally advanced disease in the chest. He recently completed 6 cycles of systemic chemotherapy with carboplatin, Alimta and Avastin and tolerated this treatment fairly well. He was started on maintenance treatment with Alimta and Avastin status post 15 cycles.  Avastin was discontinued secondary to intolerance to the combination treatment.  He is currently on treatment with single agent Alimta status post 5 cycles. He has been tolerating his treatment well. Unfortunately he was recently found to have multiple metastatic brain lesion and the patient underwent whole brain irradiation. His scan showed evidence for disease progression with enlargement of left lower lobe lung nodule in addition to hilar lymphadenopathy as well as progression of the liver lesion. He was started on treatment with nivolumab 480 mg IV every 4 weeks status post 15 cycles.  He continues to tolerate his treatment well with no concerning adverse effects. I recommended for him to proceed with cycle #16 today as planned. The patient will come back for follow-up visit in 4 weeks for evaluation before the next cycle of his treatment. For the nausea he will continue his current treatment with Compazine and Zofran as needed. He was advised to call immediately if he has any other concerning symptoms in the interval. The patient voices understanding of current  disease status and treatment options and is in agreement with the current care plan. All questions were answered. The patient knows to call the clinic with any problems, questions or concerns. We can certainly see the patient much sooner if necessary.  Disclaimer: This note was dictated with voice recognition software. Similar sounding words can inadvertently be transcribed and may not be corrected upon review.

## 2019-12-20 NOTE — Patient Instructions (Signed)
[facility] Discharge Instructions for Patients Receiving Chemotherapy  Today you received the following chemotherapy agents: Nivolumab  To help prevent nausea and vomiting after your treatment, we encourage you to take your nausea medication as directed.    If you develop nausea and vomiting that is not controlled by your nausea medication, call the clinic.   BELOW ARE SYMPTOMS THAT SHOULD BE REPORTED IMMEDIATELY:  *FEVER GREATER THAN 100.5 F  *CHILLS WITH OR WITHOUT FEVER  NAUSEA AND VOMITING THAT IS NOT CONTROLLED WITH YOUR NAUSEA MEDICATION  *UNUSUAL SHORTNESS OF BREATH  *UNUSUAL BRUISING OR BLEEDING  TENDERNESS IN MOUTH AND THROAT WITH OR WITHOUT PRESENCE OF ULCERS  *URINARY PROBLEMS  *BOWEL PROBLEMS  UNUSUAL RASH Items with * indicate a potential emergency and should be followed up as soon as possible.  Feel free to call the clinic should you have any questions or concerns. The clinic phone number is [phone].  Please show the CHEMO ALERT CARD at check-in to the Emergency Department and triage nurse.   

## 2019-12-21 ENCOUNTER — Telehealth: Payer: Self-pay | Admitting: Internal Medicine

## 2019-12-21 NOTE — Telephone Encounter (Signed)
Scheduled per 3/10 los. Called and left msg. Mailed printout

## 2019-12-21 NOTE — Progress Notes (Signed)
  Radiation Oncology         [phone]) [phone] ________________________________  Name: Calvin Wagner MRN: [id number]  Date: 11/03/2019  DOB: 1966-04-25  End of Treatment Note  Diagnosis:   Brain metastasis     Indication for treatment:  palliative       Radiation treatment dates:   11/03/19  Site/dose:    PTV2 Lt Frontal 49mm was treated to a dose of 20 Gy using 3 arc treatments.  Narrative: The patient tolerated radiation treatment well.   There were no signs of acute toxicity after treatment.  Plan: The patient has completed radiation treatment. The patient will return to radiation oncology clinic for routine followup in one month. I advised the patient to call or return sooner if they have any questions or concerns related to their recovery or treatment. ________________________________  ------------------------------------------------  Jodelle Gross, MD, PhD

## 2019-12-27 ENCOUNTER — Encounter: Payer: Self-pay | Admitting: Medical Oncology

## 2019-12-27 ENCOUNTER — Telehealth: Payer: Self-pay | Admitting: Medical Oncology

## 2019-12-27 NOTE — Telephone Encounter (Signed)
'  Is it okay for pt to get COVID vaccine?" I told Calvin Wagner it is okay and gave her letter. Letter left upfront.

## 2020-01-02 ENCOUNTER — Emergency Department (HOSPITAL_COMMUNITY): Payer: Medicare Other

## 2020-01-02 ENCOUNTER — Other Ambulatory Visit: Payer: Self-pay

## 2020-01-02 ENCOUNTER — Emergency Department (HOSPITAL_COMMUNITY)
Admission: EM | Admit: 2020-01-02 | Discharge: 2020-01-02 | Disposition: A | Discharge status: Home / Self Care | Payer: Medicare Other | Attending: Emergency Medicine | Admitting: Emergency Medicine

## 2020-01-02 DIAGNOSIS — Z7901 Long term (current) use of anticoagulants: Secondary | ICD-10-CM | POA: Insufficient documentation

## 2020-01-02 DIAGNOSIS — Z79899 Other long term (current) drug therapy: Secondary | ICD-10-CM | POA: Diagnosis not present

## 2020-01-02 DIAGNOSIS — Z85841 Personal history of malignant neoplasm of brain: Secondary | ICD-10-CM | POA: Diagnosis not present

## 2020-01-02 DIAGNOSIS — R569 Unspecified convulsions: Secondary | ICD-10-CM | POA: Insufficient documentation

## 2020-01-02 DIAGNOSIS — I10 Essential (primary) hypertension: Secondary | ICD-10-CM | POA: Diagnosis not present

## 2020-01-02 DIAGNOSIS — Z85118 Personal history of other malignant neoplasm of bronchus and lung: Secondary | ICD-10-CM | POA: Insufficient documentation

## 2020-01-02 LAB — BASIC METABOLIC PANEL
Anion gap: 15 (ref 5–15)
BUN: 10 mg/dL (ref 6–20)
CO2: 20 mmol/L — ABNORMAL LOW (ref 22–32)
Calcium: 9.2 mg/dL (ref 8.9–10.3)
Chloride: 104 mmol/L (ref 98–111)
Creatinine, Ser: 1.42 mg/dL — ABNORMAL HIGH (ref 0.61–1.24)
GFR calc Af Amer: >60 mL/min (ref >60)
GFR calc non Af Amer: 56 mL/min — ABNORMAL LOW (ref >60)
Glucose, Bld: 151 mg/dL — ABNORMAL HIGH (ref 70–99)
Potassium: 3.9 mmol/L (ref 3.5–5.1)
Sodium: 139 mmol/L (ref 135–145)

## 2020-01-02 LAB — CBC
HCT: 49.3 % (ref 39.0–52.0)
Hemoglobin: 16.6 g/dL (ref 13.0–17.0)
MCH: 34.2 pg — ABNORMAL HIGH (ref 26.0–34.0)
MCHC: 33.7 g/dL (ref 30.0–36.0)
MCV: 101.4 fL — ABNORMAL HIGH (ref 80.0–100.0)
Platelets: 242 10*3/uL (ref 150–400)
RBC: 4.86 MIL/uL (ref 4.22–5.81)
RDW: 12.3 % (ref 11.5–15.5)
WBC: 6 10*3/uL (ref 4.0–10.5)
nRBC: 0 % (ref 0.0–0.2)

## 2020-01-02 LAB — CBG MONITORING, ED: Glucose-Capillary: 150 mg/dL — ABNORMAL HIGH (ref 70–99)

## 2020-01-02 MED ORDER — LEVETIRACETAM 750 MG PO TABS: 750.0000 mg | ORAL_TABLET | Freq: Two times a day (BID) | ORAL | 0 refills | Status: DC

## 2020-01-02 MED ORDER — SODIUM CHLORIDE 0.9 % IV SOLN
2000.0000 mg | Freq: Once | INTRAVENOUS | Status: AC
  Administered 2020-01-02: 2000 mg via INTRAVENOUS
  Filled 2020-01-02: qty 20

## 2020-01-02 MED ORDER — DEXAMETHASONE SODIUM PHOSPHATE 10 MG/ML IJ SOLN
10.0000 mg | Freq: Once | INTRAMUSCULAR | Status: AC
  Administered 2020-01-02: 10 mg via INTRAVENOUS
  Filled 2020-01-02: qty 1

## 2020-01-02 MED ORDER — DEXAMETHASONE 4 MG PO TABS: 4.0000 mg | ORAL_TABLET | Freq: Two times a day (BID) | ORAL | 1 refills | Status: DC

## 2020-01-02 NOTE — ED Triage Notes (Signed)
Pt here after witnessed 22 second full body seizure, which had resolved on EMS arrival but patient was post-ictal. No hx of seizure, hx of brain tumor tx with radiation last month. A/O x 4 on arrival to ED. Pt has headache but no other complaints.

## 2020-01-02 NOTE — Discharge Instructions (Signed)
Begin taking Keppra and Decadron as prescribed.  Follow-up with your oncologist and radiation oncologist in the next 2 days.  Return to the ER if you experience any new and/or concerning symptoms.  Follow-up with neurology in the next week.  The contact information for [facility] neurology has been provided in this discharge summary for you to call and make these arrangements.  No driving until cleared by the neurologist.  You should not swim or perform other activities were a recurrent seizure could put you or others at risk.

## 2020-01-02 NOTE — ED Provider Notes (Signed)
[facility] EMERGENCY DEPARTMENT Provider Note   CSN: [id number] Arrival date & time: 01/02/20  1448     History Chief Complaint  Patient presents with  . Seizures    KYLIN DUBS is a 54 y.o. male.  Patient is a 54 year old male with past medical history of lung cancer with brain metastasis treated with stereotactic surgery and radiation.  Patient was at home with his son today having a conversation when he suddenly became confused, then fell over onto the floor and began shaking all over.  He had what was reported as seizure activity for approximately 90 seconds, then this resolved.  Patient then remembers waking up with EMS present.  He was initially confused, however is back to baseline.  He denies any headache, weakness, or numbness.  He denies any visual dis  The history is provided by the patient.  Seizures Seizure activity on arrival: no   Seizure type:  Grand mal Preceding symptoms: no headache   Initial focality:  None Postictal symptoms: confusion and memory loss   Return to baseline: yes   Severity:  Moderate Timing:  Once Progression:  Resolved      Past Medical History:  Diagnosis Date  . Encounter for antineoplastic chemotherapy 12/31/2016  . GERD 11/08/2009   Qualifier: Diagnosis of  By: Ronnald Ramp MD, Arvid Right Goals of care, counseling/discussion 12/31/2016  . History of radiation therapy 01/13/2017 - 02/02/2017   Site/dose:  Left lung: 30 Gy in 15 fractions  . HYPERTENSION 11/08/2009   Qualifier: Diagnosis of  By: Ronnald Ramp MD, Arvid Right.   . Large cell carcinoma of left lung, stage 4 ([place]) 12/31/2016  . Migraine 02/25/2012  . Pneumonia   . PONV (postoperative nausea and vomiting)   . Rotator cuff tear, left     Patient Active Problem List   Diagnosis Date Noted  . Umbilical hernia 54/27/0623  . Tinnitus 12/04/2019  . Hyperlipidemia 12/04/2019  . Diastasis recti 12/04/2019  . Dysphagia 12/04/2019  . Eosinophilic esophagitis  76/28/3151  . Dry skin 11/22/2019  . Encounter for antineoplastic immunotherapy 10/21/2018  . Non-small cell lung cancer (NSCLC) ([place]) 06/08/2018  . Brain metastasis ([place]) 02/17/2017  . Hypersensitivity reaction 01/12/2017  . Long term current use of anticoagulant therapy 01/12/2017  . Large cell carcinoma of left lung, stage 4 ([place]) 12/31/2016  . Goals of care, counseling/discussion 12/31/2016  . Encounter for antineoplastic chemotherapy 12/31/2016  . Obstructive pneumonia 11/25/2016  . Pulmonary embolus ([place]) 11/25/2016  . Bronchial obstruction   . Solitary pulmonary nodule 11/24/2016  . Dyspnea 11/24/2016  . Cough 10/20/2016  . Pulmonary infiltrates on CXR 10/20/2016  . Asthmatic bronchitis , chronic ([place]) 10/20/2016  . Headache 10/10/2015  . Worsening headaches 10/10/2015  . Vision changes 10/10/2015  . Snoring 10/10/2015  . Uncontrolled morning headache 10/10/2015  . 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 Dizziness 10/10/2015  . Nausea with vomiting 10/10/2015  . Chest pain 07/21/2014  . Alcohol dependence ([place]) 07/21/2014  . Abdominal pain 07/21/2014  . Chest pain, atypical 07/21/2014  . Chest pain at rest 07/21/2014  . Other abnormal glucose 10/26/2012  . Migraine 02/25/2012  . Hypertension 11/08/2009  . GERD 11/08/2009    Past Surgical History:  Procedure Laterality Date  . INGUINAL HERNIA REPAIR    . SHOULDER ARTHROSCOPY WITH ROTATOR CUFF REPAIR Left 11/12/2016   Procedure: SHOULDER ARTHROSCOPY WITH ROTATOR CUFF REPAIR AND SUBACROMIAL DECOMPRESSION;  Surgeon: Tania Ade, MD;  Location: [facility];  Service:  Orthopedics;  Laterality: Left;  SHOULDER ARTHROSCOPY WITH ROTATOR CUFF REPAIR AND SUBACROMIAL DECOMPRESSION  . TONSILLECTOMY    . TOTAL KNEE ARTHROPLASTY    . VIDEO BRONCHOSCOPY Bilateral 12/10/2016   Procedure: VIDEO BRONCHOSCOPY WITHOUT FLUORO;  Surgeon: Tanda Rockers, MD;  Location: [facility];  Service: Cardiopulmonary;  Laterality: Bilateral;        Family History  Problem Relation Age of Onset  . Heart disease Mother 41  . Lung cancer Other        family history  . CAD Other        strong family history male and male <50  . Mental retardation Other   . Heart attack Brother 76  . Alcohol abuse Neg Hx   . Diabetes Neg Hx   . Early death Neg Hx   . Hyperlipidemia Neg Hx   . Hypertension Neg Hx   . Kidney disease Neg Hx   . Stroke Neg Hx   . Migraines Neg Hx     Social History   Tobacco Use  . Smoking status: Never Smoker  . Smokeless tobacco: Never Used  Substance Use Topics  . Alcohol use: Yes    Comment: social  . Drug use: No    Home Medications Prior to Admission medications   Medication Sig Start Date End Date Taking? Authorizing Provider  albuterol (PROVENTIL HFA;VENTOLIN HFA) 108 (90 Base) MCG/ACT inhaler Inhale 2 puffs into the lungs every 4 (four) hours as needed. 06/08/18   [provider]  amLODipine (NORVASC) 5 MG tablet Take 5 mg by mouth daily.    [provider]  budesonide (RA BUDESONIDE) 32 MCG/ACT nasal spray Place into both nostrils daily.    [provider]  BUDESONIDE PO Take by mouth. Budesonide slurry    [provider]  chlorpheniramine-HYDROcodone (TUSSIONEX) 10-8 MG/5ML SUER Take 5 mLs by mouth every 12 (twelve) hours as needed for cough. 08/02/19   Heilingoetter, Cassandra L, PA-C  famotidine (PEPCID) 20 MG tablet Take 20 mg by mouth 2 (two) times daily.    [provider]  folic acid (FOLVITE) 1 MG tablet Take 1 tablet (1 mg total) by mouth daily. 07/06/17   Curt Bears, MD  LORazepam (ATIVAN) 1 MG tablet Take one tab po 30 minutes prior to MRI or radiotherapy. 11/20/19   Hayden Pedro, PA-C  losartan (COZAAR) 50 MG tablet Take 50 mg by mouth daily.    [provider]  magic mouthwash SOLN Take 5 mLs by mouth 4 (four) times daily as needed for mouth pain. 1:1:1 mix-Nystatin. Benadryl and extra strength maalox. 09/29/19    Heilingoetter, Cassandra L, PA-C  Multiple Vitamin (MULTIVITAMIN WITH MINERALS) TABS tablet Take 1 tablet by mouth daily.    [provider]  ondansetron (ZOFRAN) 8 MG tablet TAKE 1 TABLET BY MOUTH EVERY 8 HOURS AS NEEDED FOR NAUSEA OR VOMITING. 11/21/18   Hayden Pedro, PA-C  pantoprazole (PROTONIX) 40 MG tablet Take 40 mg by mouth daily. 10/20/16   [provider]  PROAIR HFA 108 (90 Base) MCG/ACT inhaler Inhale 2 puffs into the lungs every 4 (four) hours as needed. 06/08/18   [provider]  prochlorperazine (COMPAZINE) 10 MG tablet TAKE 1 TABLET BY MOUTH EVERY 6 HOURS AS NEEDED FOR NAUSEA OR VOMITING 02/09/19   Hayden Pedro, PA-C  traMADol (ULTRAM) 50 MG tablet TAKE 1 TABLET BY MOUTH EVERY 12 HOURS AS NEEDED 12/31/17   Curt Bears, MD  XARELTO 20 MG TABS tablet  TAKE 1 TABLET BY MOUTH EVERY DAY WITH SUPPER 11/07/19   Curt Bears, MD    Allergies    Patient has no known allergies.  Review of Systems   Review of Systems  Neurological: Positive for seizures.  All other systems reviewed and are negative.   Physical Exam Updated Vital Signs BP (!) 119/106 (BP Location: Right Arm)   Pulse (!) 107   Temp 97.6 F (36.4 C) (Oral)   Resp 16   SpO2 99%   Physical Exam Vitals and nursing note reviewed.  Constitutional:      General: He is not in acute distress.    Appearance: He is well-developed. He is not diaphoretic.  HENT:     Head: Normocephalic and atraumatic.  Eyes:     Extraocular Movements: Extraocular movements intact.     Pupils: Pupils are equal, round, and reactive to light.  Cardiovascular:     Rate and Rhythm: Normal rate and regular rhythm.     Heart sounds: No murmur. No friction rub.  Pulmonary:     Effort: Pulmonary effort is normal. No respiratory distress.     Breath sounds: Normal breath sounds. No wheezing or rales.  Abdominal:     General: Bowel sounds are normal. There is no distension.     Palpations:  Abdomen is soft.     Tenderness: There is no abdominal tenderness.  Musculoskeletal:        General: Normal range of motion.     Cervical back: Normal range of motion and neck supple.  Skin:    General: Skin is warm and dry.  Neurological:     General: No focal deficit present.     Mental Status: He is alert and oriented to person, place, and time.     Cranial Nerves: No cranial nerve deficit.     Motor: No weakness.     Coordination: Coordination normal.     ED Results / Procedures / Treatments   Labs (all labs ordered are listed, but only abnormal results are displayed) Labs Reviewed  BASIC METABOLIC PANEL - Abnormal; Notable for the following components:      Result Value   CO2 20 (*)    Glucose, Bld 151 (*)    Creatinine, Ser 1.42 (*)    GFR calc non Af Amer 56 (*)    All other components within normal limits  CBC - Abnormal; Notable for the following components:   MCV 101.4 (*)    MCH 34.2 (*)    All other components within normal limits  CBG MONITORING, ED - Abnormal; Notable for the following components:   Glucose-Capillary 150 (*)    All other components within normal limits    EKG None  Radiology No results found.  Procedures Procedures (including critical care time)  Medications Ordered in ED Medications - No data to display  ED Course  I have reviewed the triage vital signs and the nursing notes.  Pertinent labs & imaging results that were available during my care of the patient were reviewed by me and considered in my medical decision making (see chart for details).    MDM Rules/Calculators/A&P  Patient with history of lung cancer with brain metastasis treated with radiation.  He presents today after what appears to be a seizure.  Patient is neurologically intact and his work-up is thus far showing edema in the area of the prior brain metastasis, however no new lesions.  In consultation with neurology (Dr. Lorraine Lax), patient's been started  on Keppra.   In consultation with oncology (Dr. Benay Spice), he will also be started on Decadron.  Patient advised not to drive until cleared by neurology.  He is to follow-up with his oncologist in the next 2 to 3 days.  Final Clinical Impression(s) / ED Diagnoses Final diagnoses:  None    Rx / DC Orders ED Discharge Orders    None       Veryl Speak, MD 01/02/20 2320

## 2020-01-03 ENCOUNTER — Telehealth: Payer: Self-pay | Admitting: Radiation Oncology

## 2020-01-03 ENCOUNTER — Other Ambulatory Visit: Payer: Self-pay | Admitting: Radiation Therapy

## 2020-01-03 ENCOUNTER — Telehealth: Payer: Self-pay | Admitting: Medical Oncology

## 2020-01-03 NOTE — Telephone Encounter (Addendum)
The patient's wife called and let us know he had a seizure at home yesterday witnessed by their son. He had some changes in speech, slurring, and making strange noises. His son tried to help him to the sofa, and then collapsed and then started convulsing for 2 solid minutes and called EMS. 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 Today he is "foggy" but recalls the episode. His speech is normal, and said he remembers the feeling of convulsions during the episode as well. I will reach out to Dr. Mickeal Skinner as well.

## 2020-01-03 NOTE — Telephone Encounter (Signed)
Had seizure at home yesterday ( convulsing and collapsed).   Seen in ED Restarted on decadron and Keppra.   "CT head: IMPRESSION: 1. Progressed cerebral edema in the left hemisphere since the restaging Brain MRI in December. 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  2. But there is no significant intracranial mass effect, and no new intracranial abnormality identified.  Electronically Signed   By: Genevie Ann M.D.   On: 01/02/2020 19:53".    Next appt 01/17/20

## 2020-01-05 ENCOUNTER — Telehealth: Payer: Self-pay | Admitting: Internal Medicine

## 2020-01-05 ENCOUNTER — Other Ambulatory Visit: Payer: Self-pay

## 2020-01-05 ENCOUNTER — Inpatient Hospital Stay (HOSPITAL_BASED_OUTPATIENT_CLINIC_OR_DEPARTMENT_OTHER): Payer: Medicare Other | Admitting: Internal Medicine

## 2020-01-05 VITALS — BP 129/78 | HR 98 | Temp 98.7°F | Resp 17 | Ht 73.0 in | Wt 248.8 lb

## 2020-01-05 DIAGNOSIS — R569 Unspecified convulsions: Secondary | ICD-10-CM

## 2020-01-05 DIAGNOSIS — C7931 Secondary malignant neoplasm of brain: Secondary | ICD-10-CM | POA: Diagnosis not present

## 2020-01-05 DIAGNOSIS — C3432 Malignant neoplasm of lower lobe, left bronchus or lung: Secondary | ICD-10-CM | POA: Diagnosis not present

## 2020-01-05 NOTE — Telephone Encounter (Signed)
Scheduled appt per 3/26 los.  Left a vm of the appt date and time.

## 2020-01-05 NOTE — Progress Notes (Signed)
[facility] [place], [place] [address] [phone]   Interval Evaluation  Date of Service: 01/05/20 Patient Name: Calvin Wagner Patient MRN: [id number] Patient DOB: Jul 23, 1966 Provider: Ventura Sellers, MD  Identifying Statement:  Calvin Wagner is a 54 y.o. male with Brain metastasis ([place]) [C79.31]   Primary Cancer:  Oncologic History: Oncology History Overview Note  Patient presented with onset of cough then followed with scans.       Cancer ([place]) (Resolved)  11/25/2016 Initial Diagnosis   Cancer ([place])   11/25/2016 Imaging   CTA IMPRESSION: 1. Multiple bilateral acute pulmonary emboli, segmental to subsegmental size. 2. 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 There is complete obstruction of the lingular and left lower lobe airways. 1 cm pulmonary nodule in the left lateral costophrenic sulcus.   11/26/2016 Imaging   CT Abdomen No mass or adenopathy in the abdomen or pelvis   12/10/2016 Surgery   Operation: Video Flexible fiberoptic bronchoscopy, diagnostic    12/17/2016 Imaging   PET IMPRESSION: 1. Extensive left hilar and mediastinal all hypermetabolic lymphadenopathy, compatible with underlying malignancy. These findings could reflect either primary lymphoma or bronchogenic carcinoma such as a small cell carcinoma. 2. There is also a small left lower lobe pulmonary nodule measuring 11 mm which is mildly hypermetabolic. This could potentially represent the primary bronchogenic neoplasm, but it is un usual for a small nodule of this size to result in such extensive lymphadenopathy.   01/02/2017 Imaging   MRI Brain IMPRESSION:  Positive for a solitary 13 mm rim enhancing metastasis in the left anterior inferior frontal gyrus (series 10, image 72. No associated edema or mass effect.   01/04/2017 -  Radiation Therapy   SIM Chest    01/11/2017 -  Chemotherapy   The  patient had palonosetron (ALOXI) injection 0.25 mg, 0.25 mg, Intravenous,  Once, 1 of 7 cycles  CARBOplatin (PARAPLATIN) 280 mg in sodium chloride 0.9 % 250 mL chemo infusion, 280 mg (100 % of original dose 281.6 mg), Intravenous,  Once, 1 of 7 cycles Dose modification: 281.6 mg (original dose 281.6 mg, Cycle 1)  PACLitaxel (TAXOL) 102 mg in dextrose 5 % 250 mL chemo infusion ( for chemotherapy treatment.     Large cell carcinoma of left lung, stage 4 (HCC)  12/31/2016 Initial Diagnosis   Large cell carcinoma of left lung, stage 4 ([place])   10/27/2018 -  Chemotherapy   The patient had nivolumab (OPDIVO) 480 mg in sodium chloride 0.9 % 100 mL chemo infusion, 480 mg, Intravenous, Once, 16 of 23 cycles Administration: 480 mg (10/27/2018), 480 mg (11/24/2018), 480 mg (12/22/2018), 480 mg (01/19/2019), 480 mg (02/16/2019), 480 mg (03/16/2019), 480 mg (04/10/2019), 480 mg (05/11/2019), 480 mg (06/08/2019), 480 mg (07/05/2019), 480 mg (08/02/2019), 480 mg (08/30/2019), 480 mg (09/29/2019), 480 mg (10/25/2019), 480 mg (11/22/2019), 480 mg (12/20/2019)  for chemotherapy treatment.     CNS Oncologic History: 11/03/19: SRS L temporal 20Gy Lisbeth Renshaw) 10/14/18: Whole Brain / 30 Gy in 10 fractions 01/25/17: SRS Treatment PTV1Left Frontal 41mm: 20 Gy in 1 fraction  Interval History:  Calvin Wagner presents to clinic for evaluation following recent clinical changes.  He and his wife describe a seizure event on 01/02/20, described as "inability to speak followed by 2-3 minutes of generalized shaking".  There was significant confusion following the event which improved slowly over 1-2 days.  He was started on Keppra and Decadron 4mg  BID  in the ED. CT demonstrated inflammatory changes, no MRI was performed. At this time he is back at his baseline. He remains functionally independent and intact otherwise.  No issues with gait or cognition.  Continues on single agent nivolumab since the start of 2020.    Medications: Current  Outpatient Medications on File Prior to Visit  Medication Sig Dispense Refill  . albuterol (PROVENTIL HFA;VENTOLIN HFA) 108 (90 Base) MCG/ACT inhaler Inhale 2 puffs into the lungs every 4 (four) hours as needed for wheezing or shortness of breath.     Marland Kitchen amLODipine (NORVASC) 5 MG tablet Take 5 mg by mouth daily.    . chlorpheniramine-HYDROcodone (TUSSIONEX) 10-8 MG/5ML SUER Take 5 mLs by mouth every 12 (twelve) hours as needed for cough. (Patient not taking: Reported on 01/02/2020) 240 mL 0  . dexamethasone (DECADRON) 4 MG tablet Take 1 tablet (4 mg total) by mouth 2 (two) times daily. 30 tablet 1  . famotidine (PEPCID) 20 MG tablet Take 20 mg by mouth 2 (two) times daily.    . folic acid (FOLVITE) 1 MG tablet Take 1 tablet (1 mg total) by mouth daily. (Patient not taking: Reported on 01/02/2020) 30 tablet 4  . levETIRAcetam (KEPPRA) 750 MG tablet Take 1 tablet (750 mg total) by mouth 2 (two) times daily. 60 tablet 0  . LORazepam (ATIVAN) 1 MG tablet Take one tab po 30 minutes prior to MRI or radiotherapy. (Patient not taking: Reported on 01/02/2020) 30 tablet 0  . losartan (COZAAR) 50 MG tablet Take 50 mg by mouth daily.    . magic mouthwash SOLN Take 5 mLs by mouth 4 (four) times daily as needed for mouth pain. 1:1:1 mix-Nystatin. Benadryl and extra strength maalox. (Patient not taking: Reported on 01/02/2020) 240 mL 0  . Multiple Vitamin (MULTIVITAMIN WITH MINERALS) TABS tablet Take 1 tablet by mouth daily.    . ondansetron (ZOFRAN) 8 MG tablet TAKE 1 TABLET BY MOUTH EVERY 8 HOURS AS NEEDED FOR NAUSEA OR VOMITING. (Patient not taking: Reported on 01/02/2020) 90 tablet 0  . PROAIR HFA 108 (90 Base) MCG/ACT inhaler Inhale 2 puffs into the lungs every 4 (four) hours as needed.  5  . prochlorperazine (COMPAZINE) 10 MG tablet TAKE 1 TABLET BY MOUTH EVERY 6 HOURS AS NEEDED FOR NAUSEA OR VOMITING (Patient not taking: Reported on 01/02/2020) 90 tablet 0  . traMADol (ULTRAM) 50 MG tablet TAKE 1 TABLET BY MOUTH  EVERY 12 HOURS AS NEEDED (Patient not taking: Reported on 01/02/2020) 30 tablet 0  . XARELTO 20 MG TABS tablet TAKE 1 TABLET BY MOUTH EVERY DAY WITH SUPPER (Patient taking differently: Take 20 mg by mouth daily with supper. ) 90 tablet 0   No current facility-administered medications on file prior to visit.    Allergies: No Known Allergies Past Medical History:  Past Medical History:  Diagnosis Date  . Encounter for antineoplastic chemotherapy 12/31/2016  . GERD 11/08/2009   Qualifier: Diagnosis of  By: Ronnald Ramp MD, Arvid Right Goals of care, counseling/discussion 12/31/2016  . History of radiation therapy 01/13/2017 - 02/02/2017   Site/dose:  Left lung: 30 Gy in 15 fractions  . HYPERTENSION 11/08/2009   Qualifier: Diagnosis of  By: Ronnald Ramp MD, Arvid Right.   . Large cell carcinoma of left lung, stage 4 ([place]) 12/31/2016  . Migraine 02/25/2012  . Pneumonia   . PONV (postoperative nausea and vomiting)   . Rotator cuff tear, left    Past Surgical History:  Past Surgical History:  Procedure Laterality Date  . INGUINAL HERNIA REPAIR    . SHOULDER ARTHROSCOPY WITH ROTATOR CUFF REPAIR Left 11/12/2016   Procedure: SHOULDER ARTHROSCOPY WITH ROTATOR CUFF REPAIR AND SUBACROMIAL DECOMPRESSION;  Surgeon: Tania Ade, MD;  Location: [facility];  Service: Orthopedics;  Laterality: Left;  SHOULDER ARTHROSCOPY WITH ROTATOR CUFF REPAIR AND SUBACROMIAL DECOMPRESSION  . TONSILLECTOMY    . TOTAL KNEE ARTHROPLASTY    . VIDEO BRONCHOSCOPY Bilateral 12/10/2016   Procedure: VIDEO BRONCHOSCOPY WITHOUT FLUORO;  Surgeon: Tanda Rockers, MD;  Location: [facility];  Service: Cardiopulmonary;  Laterality: Bilateral;   Social History:  Social History   Socioeconomic History  . Marital status: Married    Spouse name: Baker Janus  . Number of children: 2  . Years of education: Not on file  . Highest education level: Not on file  Occupational History  . Occupation: Material Therapist, sports: [employer]  Tobacco Use   . Smoking status: Never Smoker  . Smokeless tobacco: Never Used  Substance and Sexual Activity  . Alcohol use: Yes    Comment: social  . Drug use: No  . Sexual activity: Not Currently  Other Topics Concern  . Not on file  Social History Narrative   Lives at home wife and 2 kids   Caffeine use: none     Social Determinants of Health   Financial Resource Strain:   . Difficulty of Paying Living Expenses:   Food Insecurity:   . Worried About Charity fundraiser in the Last Year:   . Arboriculturist in the Last Year:   Transportation Needs:   . Film/video editor (Medical):   Marland Kitchen Lack of Transportation (Non-Medical):   Physical Activity:   . Days of Exercise per Week:   . Minutes of Exercise per Session:   Stress:   . Feeling of Stress :   Social Connections:   . Frequency of Communication with Friends and Family:   . Frequency of Social Gatherings with Friends and Family:   . Attends Religious Services:   . Active Member of Clubs or Organizations:   . Attends Archivist Meetings:   Marland Kitchen Marital Status:   Intimate Partner Violence:   . Fear of Current or Ex-Partner:   . Emotionally Abused:   Marland Kitchen Physically Abused:   . Sexually Abused:    Family History:  Family History  Problem Relation Age of Onset  . Heart disease Mother 23  . Lung cancer Other        family history  . CAD Other        strong family history male and male <50  . Mental retardation Other   . Heart attack Brother 33  . Alcohol abuse Neg Hx   . Diabetes Neg Hx   . Early death Neg Hx   . Hyperlipidemia Neg Hx   . Hypertension Neg Hx   . Kidney disease Neg Hx   . Stroke Neg Hx   . Migraines Neg Hx     Review of Systems: Constitutional: Denies fevers, chills or abnormal weight loss Eyes: Denies blurriness of vision Ears, nose, mouth, throat, and face: Denies mucositis or sore throat Respiratory: Denies cough, dyspnea or wheezes Cardiovascular: Denies palpitation, chest discomfort or  lower extremity swelling Gastrointestinal:  Denies nausea, constipation, diarrhea GU: Denies dysuria or incontinence Skin: Denies abnormal skin rashes Neurological: Per HPI Musculoskeletal: Denies joint pain, back or neck discomfort. No decrease in ROM Behavioral/Psych: Denies anxiety, disturbance  in thought content, and mood instability   Physical Exam: Vitals:   01/05/20 1014  BP: 129/78  Pulse: 98  Resp: 17  Temp: 98.7 F (37.1 C)  SpO2: 100%   KPS: 90. General: Alert, cooperative, pleasant, in no acute distress Head: Normal EENT: No conjunctival injection or scleral icterus. Oral mucosa moist Lungs: Resp effort normal Cardiac: Regular rate and rhythm Abdomen: Soft, non-distended abdomen Skin: No rashes cyanosis or petechiae. Extremities: No clubbing or edema  Neurologic Exam: Mental Status: Awake, alert, attentive to examiner. Oriented to self and environment. Language is fluent with intact comprehension.  Cranial Nerves: Visual acuity is grossly normal. Visual fields are full. Extra-ocular movements intact. No ptosis. Face is symmetric, tongue midline. Motor: Tone and bulk are normal. Power is full in both arms and legs. Reflexes are symmetric, no pathologic reflexes present. Intact finger to nose bilaterally Sensory: Intact to light touch and temperature Gait: Normal    Labs: I have reviewed the data as listed    Component Value Date/Time   NA 139 01/02/2020 1504   NA 138 10/08/2017 0907   K 3.9 01/02/2020 1504   K 4.1 10/08/2017 0907   CL 104 01/02/2020 1504   CO2 20 (L) 01/02/2020 1504   CO2 24 10/08/2017 0907   GLUCOSE 151 (H) 01/02/2020 1504   GLUCOSE 138 10/08/2017 0907   BUN 10 01/02/2020 1504   BUN 11.7 10/08/2017 0907   CREATININE 1.42 (H) 01/02/2020 1504   CREATININE 1.22 12/20/2019 0839   CREATININE 1.2 10/08/2017 0907   CALCIUM 9.2 01/02/2020 1504   CALCIUM 9.5 10/08/2017 0907   PROT 6.9 12/20/2019 0839   PROT 7.5 10/08/2017 0907   ALBUMIN  3.7 12/20/2019 0839   ALBUMIN 3.6 10/08/2017 0907   AST 17 12/20/2019 0839   AST 22 10/08/2017 0907   ALT 19 12/20/2019 0839   ALT 24 10/08/2017 0907   ALKPHOS 81 12/20/2019 0839   ALKPHOS 88 10/08/2017 0907   BILITOT 0.6 12/20/2019 0839   BILITOT 0.42 10/08/2017 0907   GFRNONAA 56 (L) 01/02/2020 1504   GFRNONAA >60 12/20/2019 0839   GFRAA >60 01/02/2020 1504   GFRAA >60 12/20/2019 0839   Lab Results  Component Value Date   WBC 6.0 01/02/2020   NEUTROABS 2.8 12/20/2019   HGB 16.6 01/02/2020   HCT 49.3 01/02/2020   MCV 101.4 (H) 01/02/2020   PLT 242 01/02/2020    Imaging:  CT Head Wo Contrast  Result Date: 01/02/2020 CLINICAL DATA:  54 year old male status post witnessed seizure. Non-small cell lung cancer, left frontal brain metastasis treated in 2018 and then status post whole brain radiation completed 10/14/2018. EXAM: CT HEAD WITHOUT CONTRAST TECHNIQUE: Contiguous axial images were obtained from the base of the skull through the vertex without intravenous contrast. COMPARISON:  Brain MRI.  10/09/2019 and earlier. FINDINGS: Brain: Chronic vasogenic edema pattern in the left frontal lobe, appears increased since December at the level of the left middle frontal gyrus as seen on series 2, image 22 and coronal image 39 (compare to series 9, image 21 and December). The edema also tracks into the anterior left parietal lobe which is new. However, there is no associated midline shift or significant intracranial mass effect. Basilar cisterns remain normal. No ventriculomegaly. No acute intracranial hemorrhage identified. No contralateral right hemisphere or posterior fossa edema. No definite superimposed acute cortically based infarct. Vascular: No suspicious intracranial vascular hyperdensity. Skull: No suspicious osseous lesion identified. Sinuses/Orbits: Bilateral ethmoid sinus mucosal thickening has not significantly  changed. Other Visualized paranasal sinuses and mastoids are well  pneumatized. Other: No acute orbit or scalp soft tissue findings. IMPRESSION: 1. Progressed cerebral edema in the left hemisphere since the restaging Brain MRI in December. 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 2. But there is no significant intracranial mass effect, and no new intracranial abnormality identified. Electronically Signed   By: Genevie Ann M.D.   On: 01/02/2020 19:53    [facility] Clinician Interpretation: I have personally reviewed the radiological images as listed.  My interpretation, in the context of the patient's clinical presentation, is likely treatment effect   Assessment/Plan Brain Metastases  Mr. Lamagna is clinically improved today after recent focal seizure with secondary generalization and post ictal encephalopathy.  Seizure was provoked by inflammation secondary to recent left temporal radiosurgery.  This was clearly visible on non-contrast CT head.  We recommended continuation of Keppra 500mg  BID.  Will decrease to 4mg  daily decadron, then follow up with phone visit in 2 weeks for further titration.  We appreciate the opportunity to participate in the care of RIVAN SIORDIA.  We will plan to follow up with him in via phone, then following next MRI brain as scheduled.  All questions were answered. The patient knows to call the clinic with any problems, questions or concerns. No barriers to learning were detected.  The total time spent in the encounter was 40 minutes and more than 50% was on counseling and review of test results   Ventura Sellers, MD Medical Director of Neuro-Oncology [facility] [place] 01/05/20 3:52 PM

## 2020-01-11 ENCOUNTER — Telehealth: Payer: Self-pay | Admitting: Internal Medicine

## 2020-01-11 NOTE — Progress Notes (Signed)
Pharmacist Chemotherapy Monitoring - Follow Up Assessment    I verify that I have reviewed each item in the below checklist:  . Regimen for the patient is scheduled for the appropriate day and plan matches scheduled date. Marland Kitchen Appropriate non-routine labs are ordered dependent on drug ordered. . If applicable, additional medications reviewed and ordered per protocol based on lifetime cumulative doses and/or treatment regimen.   Plan for follow-up and/or issues identified: Yes . I-vent associated with next due treatment: Yes . MD and/or nursing notified: Yes  Acquanetta Belling 01/11/2020 11:59 AM

## 2020-01-11 NOTE — Telephone Encounter (Signed)
R/s appt per 4/1 schedule message - unable to reach pt . Left message with new appt date and time

## 2020-01-17 ENCOUNTER — Other Ambulatory Visit: Payer: 59

## 2020-01-17 ENCOUNTER — Ambulatory Visit: Payer: 59

## 2020-01-17 ENCOUNTER — Ambulatory Visit: Payer: 59 | Admitting: Physician Assistant

## 2020-01-17 ENCOUNTER — Ambulatory Visit: Payer: 59 | Admitting: Internal Medicine

## 2020-01-18 ENCOUNTER — Inpatient Hospital Stay: Payer: Medicare Other | Attending: Internal Medicine | Admitting: Internal Medicine

## 2020-01-18 DIAGNOSIS — C787 Secondary malignant neoplasm of liver and intrahepatic bile duct: Secondary | ICD-10-CM | POA: Diagnosis not present

## 2020-01-18 DIAGNOSIS — C7931 Secondary malignant neoplasm of brain: Secondary | ICD-10-CM

## 2020-01-18 DIAGNOSIS — C3432 Malignant neoplasm of lower lobe, left bronchus or lung: Secondary | ICD-10-CM

## 2020-01-18 DIAGNOSIS — Z5112 Encounter for antineoplastic immunotherapy: Secondary | ICD-10-CM | POA: Insufficient documentation

## 2020-01-18 DIAGNOSIS — R569 Unspecified convulsions: Secondary | ICD-10-CM | POA: Diagnosis not present

## 2020-01-18 DIAGNOSIS — C7A1 Malignant poorly differentiated neuroendocrine tumors: Secondary | ICD-10-CM | POA: Insufficient documentation

## 2020-01-18 DIAGNOSIS — Z79899 Other long term (current) drug therapy: Secondary | ICD-10-CM | POA: Insufficient documentation

## 2020-01-18 MED ORDER — DEXAMETHASONE 1 MG PO TABS: 1.0000 mg | ORAL_TABLET | Freq: Every day | ORAL | 0 refills | Status: DC

## 2020-01-18 NOTE — Progress Notes (Signed)
I connected with Calvin Wagner on 01/18/20 at 10:00 AM EDT by telephone visit and verified that I am speaking with the correct person using two identifiers.  I discussed the limitations, risks, security and privacy concerns of performing an evaluation and management service by telemedicine and the availability of in-person appointments. I also discussed with the patient that there may be a patient responsible charge related to this service. The patient expressed understanding and agreed to proceed.  Other persons participating in the visit and their role in the encounter:  n/a  Patient's location:  Home  Provider's location:  Office  Chief Complaint:  Brain Metastases, Seizures  History of Present Ilness: No recurrence of seizures since our prior visit.  No new or progressive neurologic deficits.   Observations: Language and cognition stable Assessment and Plan: Recommended decreasing decadron to 1mg  daily x7 days, then discontinuing Follow Up Instructions: Will follow up after scheduled MRI brain in 2 weeks  I discussed the assessment and treatment plan with the patient.  The patient was provided an opportunity to ask questions and all were answered.  The patient agreed with the plan and demonstrated understanding of the instructions.    The patient was advised to call back or seek an in-person evaluation if the symptoms worsen or if the condition fails to improve as anticipated.  I provided 5-10 minutes of non-face-to-face time during this enocunter.  Ventura Sellers, MD   I provided 10 minutes of non face-to-face telephone visit time during this encounter, and > 50% was spent counseling as documented under my assessment & plan.

## 2020-01-22 NOTE — Progress Notes (Signed)
Pharmacist Chemotherapy Monitoring - Follow Up Assessment    I verify that I have reviewed each item in the below checklist:  . Regimen for the patient is scheduled for the appropriate day and plan matches scheduled date. Marland Kitchen Appropriate non-routine labs are ordered dependent on drug ordered. . If applicable, additional medications reviewed and ordered per protocol based on lifetime cumulative doses and/or treatment regimen.   Plan for follow-up and/or issues identified: Yes . I-vent associated with next due treatment: Yes . MD and/or nursing notified: No  Romualdo Bolk [facility] 01/22/2020 2:01 PM

## 2020-01-23 ENCOUNTER — Encounter: Payer: Self-pay | Admitting: Internal Medicine

## 2020-01-23 ENCOUNTER — Inpatient Hospital Stay: Payer: Medicare Other

## 2020-01-23 ENCOUNTER — Telehealth: Payer: Self-pay | Admitting: Medical Oncology

## 2020-01-23 ENCOUNTER — Inpatient Hospital Stay (HOSPITAL_BASED_OUTPATIENT_CLINIC_OR_DEPARTMENT_OTHER): Payer: Medicare Other | Admitting: Internal Medicine

## 2020-01-23 ENCOUNTER — Other Ambulatory Visit: Payer: Self-pay

## 2020-01-23 DIAGNOSIS — C3490 Malignant neoplasm of unspecified part of unspecified bronchus or lung: Secondary | ICD-10-CM | POA: Diagnosis not present

## 2020-01-23 DIAGNOSIS — C7A1 Malignant poorly differentiated neuroendocrine tumors: Secondary | ICD-10-CM | POA: Diagnosis not present

## 2020-01-23 DIAGNOSIS — Z5112 Encounter for antineoplastic immunotherapy: Secondary | ICD-10-CM

## 2020-01-23 DIAGNOSIS — C7931 Secondary malignant neoplasm of brain: Secondary | ICD-10-CM

## 2020-01-23 DIAGNOSIS — C3492 Malignant neoplasm of unspecified part of left bronchus or lung: Secondary | ICD-10-CM

## 2020-01-23 DIAGNOSIS — C3432 Malignant neoplasm of lower lobe, left bronchus or lung: Secondary | ICD-10-CM | POA: Diagnosis not present

## 2020-01-23 DIAGNOSIS — Z79899 Other long term (current) drug therapy: Secondary | ICD-10-CM | POA: Diagnosis not present

## 2020-01-23 NOTE — Progress Notes (Signed)
[facility] [phone]   [phone]  PROGRESS NOTE FOR TELEMEDICINE VISITS  Calvin Pepper, MD [place] [address] [address]  I connected [phone] on 01/23/20 at  1:45 PM EDT by video enabled telemedicine visit and verified that I am speaking with the correct person using two identifiers.   I discussed the limitations, risks, security and privacy concerns of performing an evaluation and management service by telemedicine and the availability of in-person appointments. I also discussed with the patient that there may be a patient responsible charge related to this service. The patient expressed understanding and agreed to proceed.  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  PRIOR THERAPY:  1) Stereotactic radiotherapy to a solitary brain metastasis under the care of Dr. Lisbeth Renshaw on 01/25/2017. 2) Short course of concurrent chemoradiation with weekly carboplatin for AUC of 2 and paclitaxel 45 MG/M2 to the locally advanced disease in the chest. Status post 3 cycles. 3) Systemic chemotherapy with carboplatin for AUC of 5, Alimta 500 MG/M2 and Avastin 15 MG/KG every 3 weeks. First dose of 02/15/2017. Status post 6 cycles. Last dose 06/08/2017 with stable disease. 4) whole brain irradiation under the care of Dr. Lisbeth Renshaw expected to complete on October 14, 2018. 5) Maintenance systemic chemotherapy with Alimta 500 MG/M2 in addition to Avastin 15 MG/KG every 3 weeks. First dose 07/13/2017.  Status post 20 cycles.  Starting from cycle  #16 he will be treated with single agent Alimta only secondary to intolerance.   CURRENT THERAPY: Second line treatment with immunotherapy with nivolumab 480 mg IV every 4 weeks.  First dose October 26, 2018.  Status post 16 cycles.  INTERVAL HISTORY: Calvin Wagner 54 y.o. male has a MyChart virtual video visit with me today.  The patient received his second Covid vaccine yesterday and not feeling well to come to the clinic today.  He denied having any current chest pain, shortness of breath, cough or hemoptysis.  He denied having any fever or chills.  He has no nausea, vomiting, diarrhea or constipation.  He denied having any headache or visual changes.  He is currently on a tapered dose of Decadron 1 mg p.o. daily.  He was supposed to start cycle #17 today of his treatment with immunotherapy but he could not come because of the adverse effect of the Covid vaccine.  MEDICAL HISTORY: Past Medical History:  Diagnosis Date  . Encounter for antineoplastic chemotherapy 12/31/2016  . GERD 11/08/2009   Qualifier: Diagnosis of  By: Ronnald Ramp MD, Arvid Right Goals of care, counseling/discussion 12/31/2016  . History of radiation therapy 01/13/2017 - 02/02/2017   Site/dose:  Left lung: 30 Gy in 15 fractions  . HYPERTENSION 11/08/2009   Qualifier: Diagnosis of  By: Ronnald Ramp MD, Arvid Right.   . 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 Migraine 02/25/2012  . Pneumonia   . PONV (postoperative nausea and vomiting)   . Rotator cuff tear, left     ALLERGIES:  has No Known Allergies.  MEDICATIONS:  Current Outpatient Medications  Medication Sig Dispense Refill  .  albuterol (PROVENTIL HFA;VENTOLIN HFA) 108 (90 Base) MCG/ACT inhaler Inhale 2 puffs into the lungs every 4 (four) hours as needed for wheezing or shortness of breath.     Marland Kitchen amLODipine (NORVASC) 5 MG tablet Take 5 mg by mouth daily.    . chlorpheniramine-HYDROcodone (TUSSIONEX) 10-8 MG/5ML SUER Take 5 mLs by mouth every 12  (twelve) hours as needed for cough. (Patient not taking: Reported on 01/02/2020) 240 mL 0  . dexamethasone (DECADRON) 1 MG tablet Take 1 tablet (1 mg total) by mouth daily. 30 tablet 0  . famotidine (PEPCID) 20 MG tablet Take 20 mg by mouth 2 (two) times daily.    . folic acid (FOLVITE) 1 MG tablet Take 1 tablet (1 mg total) by mouth daily. (Patient not taking: Reported on 01/02/2020) 30 tablet 4  . levETIRAcetam (KEPPRA) 750 MG tablet Take 1 tablet (750 mg total) by mouth 2 (two) times daily. 60 tablet 0  . LORazepam (ATIVAN) 1 MG tablet Take one tab po 30 minutes prior to MRI or radiotherapy. (Patient not taking: Reported on 01/02/2020) 30 tablet 0  . losartan (COZAAR) 50 MG tablet Take 50 mg by mouth daily.    . magic mouthwash SOLN Take 5 mLs by mouth 4 (four) times daily as needed for mouth pain. 1:1:1 mix-Nystatin. Benadryl and extra strength maalox. (Patient not taking: Reported on 01/02/2020) 240 mL 0  . Multiple Vitamin (MULTIVITAMIN WITH MINERALS) TABS tablet Take 1 tablet by mouth daily.    . ondansetron (ZOFRAN) 8 MG tablet TAKE 1 TABLET BY MOUTH EVERY 8 HOURS AS NEEDED FOR NAUSEA OR VOMITING. (Patient not taking: Reported on 01/02/2020) 90 tablet 0  . PROAIR HFA 108 (90 Base) MCG/ACT inhaler Inhale 2 puffs into the lungs every 4 (four) hours as needed.  5  . prochlorperazine (COMPAZINE) 10 MG tablet TAKE 1 TABLET BY MOUTH EVERY 6 HOURS AS NEEDED FOR NAUSEA OR VOMITING (Patient not taking: Reported on 01/02/2020) 90 tablet 0  . traMADol (ULTRAM) 50 MG tablet TAKE 1 TABLET BY MOUTH EVERY 12 HOURS AS NEEDED (Patient not taking: Reported on 01/02/2020) 30 tablet 0  . XARELTO 20 MG TABS tablet TAKE 1 TABLET BY MOUTH EVERY DAY WITH SUPPER (Patient taking differently: Take 20 mg by mouth daily with supper. ) 90 tablet 0   No current facility-administered medications for this visit.    SURGICAL HISTORY:  Past Surgical History:  Procedure Laterality Date  . INGUINAL HERNIA REPAIR    . SHOULDER  ARTHROSCOPY WITH ROTATOR CUFF REPAIR Left 11/12/2016   Procedure: SHOULDER ARTHROSCOPY WITH ROTATOR CUFF REPAIR AND SUBACROMIAL DECOMPRESSION;  Surgeon: Tania Ade, MD;  Location: [facility];  Service: Orthopedics;  Laterality: Left;  SHOULDER ARTHROSCOPY WITH ROTATOR CUFF REPAIR AND SUBACROMIAL DECOMPRESSION  . TONSILLECTOMY    . TOTAL KNEE ARTHROPLASTY    . VIDEO BRONCHOSCOPY Bilateral 12/10/2016   Procedure: VIDEO BRONCHOSCOPY WITHOUT FLUORO;  Surgeon: Tanda Rockers, MD;  Location: [facility];  Service: Cardiopulmonary;  Laterality: Bilateral;    REVIEW OF SYSTEMS:  A comprehensive review of systems was negative except for: Constitutional: positive for fatigue    LABORATORY DATA: Lab Results  Component Value Date   WBC 6.0 01/02/2020   HGB 16.6 01/02/2020   HCT 49.3 01/02/2020   MCV 101.4 (H) 01/02/2020   PLT 242 01/02/2020      Chemistry      Component Value Date/Time   NA 139 01/02/2020 1504   NA 138 10/08/2017 0907   K 3.9 01/02/2020  1504   K 4.1 10/08/2017 0907   CL 104 01/02/2020 1504   CO2 20 (L) 01/02/2020 1504   CO2 24 10/08/2017 0907   BUN 10 01/02/2020 1504   BUN 11.7 10/08/2017 0907   CREATININE 1.42 (H) 01/02/2020 1504   CREATININE 1.22 12/20/2019 0839   CREATININE 1.2 10/08/2017 0907      Component Value Date/Time   CALCIUM 9.2 01/02/2020 1504   CALCIUM 9.5 10/08/2017 0907   ALKPHOS 81 12/20/2019 0839   ALKPHOS 88 10/08/2017 0907   AST 17 12/20/2019 0839   AST 22 10/08/2017 0907   ALT 19 12/20/2019 0839   ALT 24 10/08/2017 0907   BILITOT 0.6 12/20/2019 0839   BILITOT 0.42 10/08/2017 0907       RADIOGRAPHIC STUDIES: CT Head Wo Contrast  Result Date: 01/02/2020 CLINICAL DATA:  54 year old male status post witnessed seizure. Non-small cell lung cancer, left frontal brain metastasis treated in 2018 and then status post whole brain radiation completed 10/14/2018. EXAM: CT HEAD WITHOUT CONTRAST TECHNIQUE: Contiguous axial images were obtained from the  base of the skull through the vertex without intravenous contrast. COMPARISON:  Brain MRI.  10/09/2019 and earlier. FINDINGS: Brain: Chronic vasogenic edema pattern in the left frontal lobe, appears increased since December at the level of the left middle frontal gyrus as seen on series 2, image 22 and coronal image 39 (compare to series 9, image 21 and December). The edema also tracks into the anterior left parietal lobe which is new. However, there is no associated midline shift or significant intracranial mass effect. Basilar cisterns remain normal. No ventriculomegaly. No acute intracranial hemorrhage identified. No contralateral right hemisphere or posterior fossa edema. No definite superimposed acute cortically based infarct. Vascular: No suspicious intracranial vascular hyperdensity. Skull: No suspicious osseous lesion identified. Sinuses/Orbits: Bilateral ethmoid sinus mucosal thickening has not significantly changed. Other Visualized paranasal sinuses and mastoids are well pneumatized. Other: No acute orbit or scalp soft tissue findings. IMPRESSION: 1. Progressed cerebral edema in the left hemisphere since the restaging Brain MRI in December. 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 2. But there is no significant intracranial mass effect, and no new intracranial abnormality identified. Electronically Signed   By: Genevie Ann M.D.   On: 01/02/2020 19:53    ASSESSMENT AND PLAN: This is a very pleasant 54 years old white male with stage IV non-small cell lung cancer, high-grade neuroendocrine carcinoma presented with locally advanced disease in the chest as well as solitary brain metastasis and 2 liver lesions. The patient is status post stereotactic radiotherapy to the solitary brain metastasis as well as short course of concurrent chemoradiation to the locally advanced disease in the chest. He recently completed 6 cycles of systemic chemotherapy with carboplatin, Alimta and  Avastin and tolerated this treatment fairly well. He was started on maintenance treatment with Alimta and Avastin status post 15 cycles.  Avastin was discontinued secondary to intolerance to the combination treatment.  He is currently on treatment with single agent Alimta status post 5 cycles. He has been tolerating his treatment well. Unfortunately he was recently found to have multiple metastatic brain lesion and the patient underwent whole brain irradiation.  He was also found on repeat imaging studies to have disease progression systemically.  The patient is currently undergoing treatment with immunotherapy with nivolumab 480 mg IV every 4 weeks status post 16 cycles.  He has been tolerating this treatment well with no concerning adverse effects. I recommended for him to proceed  with cycle #17 but this will be delayed by few days because of the recent adverse effect from the second Covid vaccine injection. I will see the patient back for follow-up visit in 4 weeks for evaluation with repeat CT scan of the chest, abdomen and pelvis for restaging of his disease. For the recurrent brain metastasis, he is followed closely by Dr. Mickeal Skinner and currently on a tapered dose of Decadron 1 mg p.o. daily.  He is scheduled to have repeat MRI of the brain on 02/01/2020. The patient was advised to call immediately if he has any concerning symptoms in the interval. I discussed the assessment and treatment plan with the patient. The patient was provided an opportunity to ask questions and all were answered. The patient agreed with the plan and demonstrated an understanding of the instructions.   The patient was advised to call back or seek an in-person evaluation if the symptoms worsen or if the condition fails to improve as anticipated.  I provided 15 minutes of face-to-face video visit time during this encounter, and > 50% was spent counseling as documented under my assessment & plan.  Eilleen Kempf, MD  01/23/2020 1:27 PM  Disclaimer: This note was dictated with voice recognition software. Similar sounding words can inadvertently be transcribed and may not be corrected upon review.

## 2020-01-23 NOTE — Telephone Encounter (Signed)
Cancelled appt - feeling bad from #2 Covid vaccine yesterday.  LVM to call back -2 options for appts 1- virtulal provider visit today and tx on Thursday  2. All appts tomorrow -see Cassie then tx.

## 2020-01-23 NOTE — Telephone Encounter (Signed)
Wants mychart visit today and labs  tx on Thursday . Message sent to scheduler.

## 2020-01-25 ENCOUNTER — Other Ambulatory Visit: Payer: Self-pay

## 2020-01-25 ENCOUNTER — Inpatient Hospital Stay: Payer: Medicare Other

## 2020-01-25 VITALS — BP 145/90 | HR 94 | Temp 98.3°F | Resp 18

## 2020-01-25 DIAGNOSIS — C3492 Malignant neoplasm of unspecified part of left bronchus or lung: Secondary | ICD-10-CM

## 2020-01-25 DIAGNOSIS — C3432 Malignant neoplasm of lower lobe, left bronchus or lung: Secondary | ICD-10-CM | POA: Diagnosis not present

## 2020-01-25 DIAGNOSIS — R5383 Other fatigue: Secondary | ICD-10-CM

## 2020-01-25 LAB — CBC WITH DIFFERENTIAL (CANCER CENTER ONLY)
Abs Immature Granulocytes: 0.08 10*3/uL — ABNORMAL HIGH (ref 0.00–0.07)
Basophils Absolute: 0 10*3/uL (ref 0.0–0.1)
Basophils Relative: 0 %
Eosinophils Absolute: 0.1 10*3/uL (ref 0.0–0.5)
Eosinophils Relative: 1 %
HCT: 47.3 % (ref 39.0–52.0)
Hemoglobin: 16.5 g/dL (ref 13.0–17.0)
Immature Granulocytes: 1 %
Lymphocytes Relative: 9 %
Lymphs Abs: 0.8 10*3/uL (ref 0.7–4.0)
MCH: 34.3 pg — ABNORMAL HIGH (ref 26.0–34.0)
MCHC: 34.9 g/dL (ref 30.0–36.0)
MCV: 98.3 fL (ref 80.0–100.0)
Monocytes Absolute: 0.5 10*3/uL (ref 0.1–1.0)
Monocytes Relative: 6 %
Neutro Abs: 6.9 10*3/uL (ref 1.7–7.7)
Neutrophils Relative %: 83 %
Platelet Count: 128 10*3/uL — ABNORMAL LOW (ref 150–400)
RBC: 4.81 MIL/uL (ref 4.22–5.81)
RDW: 13 % (ref 11.5–15.5)
WBC Count: 8.3 10*3/uL (ref 4.0–10.5)
nRBC: 0 % (ref 0.0–0.2)

## 2020-01-25 LAB — CMP (CANCER CENTER ONLY)
ALT: 47 U/L — ABNORMAL HIGH (ref 0–44)
AST: 19 U/L (ref 15–41)
Albumin: 3.3 g/dL — ABNORMAL LOW (ref 3.5–5.0)
Alkaline Phosphatase: 74 U/L (ref 38–126)
Anion gap: 11 (ref 5–15)
BUN: 25 mg/dL — ABNORMAL HIGH (ref 6–20)
CO2: 24 mmol/L (ref 22–32)
Calcium: 8.6 mg/dL — ABNORMAL LOW (ref 8.9–10.3)
Chloride: 102 mmol/L (ref 98–111)
Creatinine: 1.34 mg/dL — ABNORMAL HIGH (ref 0.61–1.24)
GFR, Est AFR Am: >60 mL/min (ref >60)
GFR, Estimated: >60 mL/min (ref >60)
Glucose, Bld: 155 mg/dL — ABNORMAL HIGH (ref 70–99)
Potassium: 3.9 mmol/L (ref 3.5–5.1)
Sodium: 137 mmol/L (ref 135–145)
Total Bilirubin: 0.6 mg/dL (ref 0.3–1.2)
Total Protein: 6.6 g/dL (ref 6.5–8.1)

## 2020-01-25 LAB — TSH: TSH: 1.199 u[IU]/mL (ref 0.320–4.118)

## 2020-01-25 MED ORDER — SODIUM CHLORIDE 0.9 % IV SOLN
480.0000 mg | Freq: Once | INTRAVENOUS | Status: AC
  Administered 2020-01-25: 16:00:00 480 mg via INTRAVENOUS
  Filled 2020-01-25: qty 48

## 2020-01-25 MED ORDER — SODIUM CHLORIDE 0.9 % IV SOLN
Freq: Once | INTRAVENOUS | Status: AC
  Filled 2020-01-25: qty 250

## 2020-01-25 NOTE — Patient Instructions (Signed)
[facility] Discharge Instructions for Patients Receiving Chemotherapy  Today you received the following chemotherapy agents: nivolumab.  To help prevent nausea and vomiting after your treatment, we encourage you to take your nausea medication as directed.   If you develop nausea and vomiting that is not controlled by your nausea medication, call the clinic.   BELOW ARE SYMPTOMS THAT SHOULD BE REPORTED IMMEDIATELY:  *FEVER GREATER THAN 100.5 F  *CHILLS WITH OR WITHOUT FEVER  NAUSEA AND VOMITING THAT IS NOT CONTROLLED WITH YOUR NAUSEA MEDICATION  *UNUSUAL SHORTNESS OF BREATH  *UNUSUAL BRUISING OR BLEEDING  TENDERNESS IN MOUTH AND THROAT WITH OR WITHOUT PRESENCE OF ULCERS  *URINARY PROBLEMS  *BOWEL PROBLEMS  UNUSUAL RASH Items with * indicate a potential emergency and should be followed up as soon as possible.  Feel free to call the clinic should you have any questions or concerns. The clinic phone number is [phone].  Please show the CHEMO ALERT CARD at check-in to the Emergency Department and triage nurse.   

## 2020-02-01 ENCOUNTER — Other Ambulatory Visit: Payer: Self-pay

## 2020-02-01 ENCOUNTER — Telehealth: Payer: Self-pay | Admitting: *Deleted

## 2020-02-01 ENCOUNTER — Ambulatory Visit
Admission: RE | Admit: 2020-02-01 | Discharge: 2020-02-01 | Disposition: A | Discharge status: Home / Self Care | Payer: 59 | Source: Ambulatory Visit | Attending: Internal Medicine | Admitting: Internal Medicine

## 2020-02-01 ENCOUNTER — Other Ambulatory Visit: Payer: Self-pay | Admitting: Internal Medicine

## 2020-02-01 DIAGNOSIS — C3492 Malignant neoplasm of unspecified part of left bronchus or lung: Secondary | ICD-10-CM

## 2020-02-01 DIAGNOSIS — I2609 Other pulmonary embolism with acute cor pulmonale: Secondary | ICD-10-CM

## 2020-02-01 DIAGNOSIS — I2782 Chronic pulmonary embolism: Secondary | ICD-10-CM

## 2020-02-01 MED ORDER — GADOBENATE DIMEGLUMINE 529 MG/ML IV SOLN
20.0000 mL | Freq: Once | INTRAVENOUS | Status: AC | PRN
  Administered 2020-02-01: 20 mL via INTRAVENOUS

## 2020-02-01 MED ORDER — LEVETIRACETAM 750 MG PO TABS: 750.0000 mg | ORAL_TABLET | Freq: Two times a day (BID) | ORAL | 3 refills | Status: DC

## 2020-02-01 NOTE — Telephone Encounter (Signed)
Wife called requesting Keppra Refill to [facility].

## 2020-02-02 ENCOUNTER — Telehealth: Payer: Self-pay

## 2020-02-02 NOTE — Telephone Encounter (Signed)
Patient's wife called and requested refill for Kepp[facility] be sent to [facility] on [address]. Informed patient's wife that a refill was sent on 02/01/20 to [facility] on [address]. Per wife she informed the person she spoke to yesterday that [facility]. [facility] but wife stated is was ok this time and she will pick it up at [facility].

## 2020-02-05 ENCOUNTER — Telehealth: Payer: Self-pay | Admitting: Internal Medicine

## 2020-02-05 NOTE — Telephone Encounter (Signed)
Called to verify patient phone visit for pre reg

## 2020-02-06 ENCOUNTER — Inpatient Hospital Stay (HOSPITAL_BASED_OUTPATIENT_CLINIC_OR_DEPARTMENT_OTHER): Payer: Medicare Other | Admitting: Internal Medicine

## 2020-02-06 DIAGNOSIS — C7931 Secondary malignant neoplasm of brain: Secondary | ICD-10-CM | POA: Diagnosis not present

## 2020-02-06 MED ORDER — DEXAMETHASONE 1 MG PO TABS
1.0000 mg | ORAL_TABLET | Freq: Two times a day (BID) | ORAL | 3 refills | Status: DC
Start: 2020-02-06 — End: 2020-03-08

## 2020-02-06 NOTE — Progress Notes (Signed)
I connected with Calvin Wagner on 02/06/20 at  2:30 PM EDT by telephone visit and verified that I am speaking with the correct person using two identifiers.  I discussed the limitations, risks, security and privacy concerns of performing an evaluation and management service by telemedicine and the availability of in-person appointments. I also discussed with the patient that there may be a patient responsible charge related to this service. The patient expressed understanding and agreed to proceed.  Other persons participating in the visit and their role in the encounter:  n/a  Patient's location:  Home  Provider's location:  Office  Chief Complaint:  Brain Metastases, Seizures  History of Present Ilness: No recurrence of seizures since our prior visit.  No new or progressive neurologic deficits.  Does feel lightheaded following keppra dosing.   Observations: Language and cognition stable  Imaging:  [facility] Clinician Interpretation: I have personally reviewed the CNS images as listed.  My interpretation, in the context of the patient's clinical presentation, is progressive disease  MR Brain W Wo Contrast  Result Date: 02/01/2020 CLINICAL DATA:  Lung carcinoma, stage IV. Assessment of treatment response. EXAM: MRI HEAD WITHOUT AND WITH CONTRAST TECHNIQUE: Multiplanar, multiecho pulse sequences of the brain and surrounding structures were obtained without and with intravenous contrast. CONTRAST:  71mL MULTIHANCE GADOBENATE DIMEGLUMINE 529 MG/ML IV SOLN COMPARISON:  Brain MRI 10/09/2019 FINDINGS: Brain: No acute infarct. The area of hyperintense T2-weighted signal within the left frontal white matter has slightly decreased. The abnormal signal in the left frontal operculum has worsened. There is also a small area hyperintense T2-weighted signal in the superior right parietal lobe, new since the prior study. There is generalized atrophy without lobar predilection. Normal midline structures. There are  multiple contrast-enhancing lesions: 1. Superior cerebellar vermis, 6 mm, previously 2 mm, series 19, image 66 2. Inferior left frontal lobe, 3.1 x 1.8 cm, previously 3.9 x 2.7 cm, image 100 3. Left frontal operculum, 13 mm, previously 6 mm, image 99 4. Superior right parietal lobe, 5 mm, new since the prior study, image 19 Vascular: Normal flow voids. Skull and upper cervical spine: Normal marrow signal. Sinuses/Orbits: Negative. Other: None. IMPRESSION: Four discrete intracranial lesions, 3 of which are increased in size from prior study. Electronically Signed   By: Ulyses Jarred M.D.   On: 02/01/2020 22:52    Assessment and Plan: Progression of previously treated left temporal lesion, likely 2/2 post-radiation inflammmation.  Two new lesions (cerebellar vermis, right parietal). Recommended treating new metastases with radiosurgery, will discuss tomorrow in tumor board.  Ok to continue to monitor left temporal lesion.  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  Follow Up Instructions: Will follow up after tumor meeting tomorrow, likely refer back to rad-onc for CT-sim and SRS.  Con't decadron 1mg  BID for now given lack of new/progressive symptoms.  Con't Keppra 750mg  BID.  I discussed the assessment and treatment plan with the patient.  The patient was provided an opportunity to ask questions and all were answered.  The patient agreed with the plan and demonstrated understanding of the instructions.    The patient was advised to call back or seek an in-person evaluation if the symptoms worsen or if the condition fails to improve as anticipated.  I provided 5-10 minutes of non-face-to-face time during this enocunter.  Ventura Sellers, MD   I provided 21 minutes of non face-to-face telephone visit time  during this encounter, and > 50% was spent counseling as documented under my assessment & plan.

## 2020-02-07 ENCOUNTER — Other Ambulatory Visit: Payer: Self-pay | Admitting: Internal Medicine

## 2020-02-08 ENCOUNTER — Other Ambulatory Visit: Payer: Self-pay

## 2020-02-08 ENCOUNTER — Ambulatory Visit
Admission: RE | Admit: 2020-02-08 | Discharge: 2020-02-08 | Disposition: A | Discharge status: Home / Self Care | Payer: Medicare Other | Source: Ambulatory Visit | Attending: Radiation Oncology | Admitting: Radiation Oncology

## 2020-02-08 ENCOUNTER — Encounter: Payer: Self-pay | Admitting: Radiation Oncology

## 2020-02-08 ENCOUNTER — Ambulatory Visit: Payer: 59 | Admitting: Internal Medicine

## 2020-02-08 ENCOUNTER — Other Ambulatory Visit: Payer: Self-pay | Admitting: Radiation Oncology

## 2020-02-08 VITALS — BP 132/91 | HR 110 | Temp 99.2°F | Resp 20 | Ht 73.0 in | Wt 252.0 lb

## 2020-02-08 DIAGNOSIS — C7931 Secondary malignant neoplasm of brain: Secondary | ICD-10-CM | POA: Insufficient documentation

## 2020-02-08 DIAGNOSIS — C3490 Malignant neoplasm of unspecified part of unspecified bronchus or lung: Secondary | ICD-10-CM

## 2020-02-08 DIAGNOSIS — C3492 Malignant neoplasm of unspecified part of left bronchus or lung: Secondary | ICD-10-CM | POA: Insufficient documentation

## 2020-02-08 MED ORDER — SODIUM CHLORIDE 0.9% FLUSH
10.0000 mL | Freq: Once | INTRAVENOUS | Status: AC
  Administered 2020-02-08: 10 mL via INTRAVENOUS

## 2020-02-08 NOTE — Progress Notes (Signed)
Pharmacist Chemotherapy Monitoring - Follow Up Assessment    I verify that I have reviewed each item in the below checklist:  . Regimen for the patient is scheduled for the appropriate day and plan matches scheduled date. Marland Kitchen Appropriate non-routine labs are ordered dependent on drug ordered. . If applicable, additional medications reviewed and ordered per protocol based on lifetime cumulative doses and/or treatment regimen.   Plan for follow-up and/or issues identified: Yes . I-vent associated with next due treatment: Yes . MD and/or nursing notified: Yes - Chemo not due 02/14/20.  Getting Q4 weeks.      Kennith Center, Pharm.D., CPP 02/08/2020@12 :35 PM

## 2020-02-08 NOTE — Progress Notes (Signed)
Has armband been applied?  Yes   Does patient have an allergy to IV contrast dye?: No   Has patient ever received premedication for IV contrast dye?: n/a  Does patient take metformin?: No  If patient does take metformin when was the last dose: n/a  Date of lab work: 01/25/2020 BUN: 25 CR: 1.34 EGfr: >60  IV site: Right AC  Has IV site been added to flowsheet?  Yes  There were no vitals taken for this visit.

## 2020-02-09 ENCOUNTER — Telehealth: Payer: Self-pay | Admitting: Internal Medicine

## 2020-02-09 NOTE — Progress Notes (Signed)
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  Interval Since Last Radiation: 3 months  11/03/19 SRS Treatment: PTV2, left Frontal 6 mm target was treated to 20 Gy in 1 fraction  09/29/2018 - 10/14/2018: Whole Brain / 30 Gy in 10 fractions  01/13/17 - 02/02/17: Left lung: 30 Gy in 15 fractions  01/25/17 SRS Treatment: PTV1Left Frontal 107m: 20 Gy in 1 fraction   Narrative: [name] a pleasant. 54y.o. 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 [facility]. He did develop progressive disease in the brain and received whole brain radiation. 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 He did have a a new lesion in the left frontal lobe treated with SRS in January 2021. He remains on immunotherapy with Dr. MJulien Wagner He had an MRI of the brain for surveillance on 02/01/20. His case was reviewed in conference. 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 He also had a new lesion in the Wagner parietal region measuring 5 mm, and in retrospect, there had been a 2 mm lesion in the cerebellar vermis, but currently it measures 6 mm. He met with Dr. VMickeal Skinnerwho continues to manage his CNS symptoms and recent seizure history. His case was also discussed in brain oncology conference and the plan is to proceed with SRS to the two new subcentimeter lesions and to consider adding Avastin for  radionecrosis. He's seen today to proceed with simulation and planning of SRS to the cerebellar vermis lesion and Wagner parietal lesion.   On review of systems, the patient reports that he is doing well overall. He took his Ativan about 30 minutes ago. He is not having any headaches, dizziness, or nausea. No other complaints are verbalized.  PAST MEDICAL HISTORY:  Past Medical History:  Diagnosis Date  . Encounter for antineoplastic chemotherapy 12/31/2016  . GERD 11/08/2009   Qualifier: Diagnosis of  By: Calvin RampMD, TArvid RightGoals of care, counseling/discussion 12/31/2016  . History of radiation therapy 01/13/2017 - 02/02/2017   Site/dose:  Left lung: 30 Gy in 15 fractions  . HYPERTENSION 11/08/2009   Qualifier: Diagnosis of  By: Calvin RampMD, TArvid Wagner   . Large cell carcinoma of left lung, stage 4 ([place] 12/31/2016  . Migraine 02/25/2012  . Pneumonia   . PONV (postoperative nausea and vomiting)   . Rotator cuff tear, left     PAST SURGICAL HISTORY: Past Surgical History:  Procedure Laterality Date  . INGUINAL HERNIA REPAIR    . SHOULDER ARTHROSCOPY WITH ROTATOR CUFF REPAIR Left 11/12/2016   Procedure: SHOULDER ARTHROSCOPY WITH ROTATOR CUFF REPAIR AND SUBACROMIAL DECOMPRESSION;  Surgeon: JTania Ade MD;  [facility]  Service: Orthopedics;  Laterality: Left;  SHOULDER ARTHROSCOPY WITH ROTATOR CUFF REPAIR AND SUBACROMIAL DECOMPRESSION  . TONSILLECTOMY    . TOTAL KNEE ARTHROPLASTY    . VIDEO BRONCHOSCOPY Bilateral 12/10/2016   Procedure: VIDEO BRONCHOSCOPY WITHOUT FLUORO;  Surgeon:  Calvin Rockers, MD;  Location: [facility];  Service: Cardiopulmonary;  Laterality: Bilateral;    PAST SOCIAL HISTORY:  Social History   Socioeconomic History  . Marital status: Married    Spouse name: Calvin Wagner  . Number of children: 2  . Years of education: Not on file  . Highest education level: Not on file  Occupational History  . Occupation: Material Therapist, sports: [employer]   Tobacco Use  . Smoking status: Never Smoker  . Smokeless tobacco: Never Used  Substance and Sexual Activity  . Alcohol use: Yes    Comment: social  . Drug use: No  . Sexual activity: Not Currently  Other Topics Concern  . Not on file  Social History Narrative   Lives at home wife and 2 kids   Caffeine use: none     Social Determinants of Health   Financial Resource Strain:   . Difficulty of Paying Living Expenses:   Food Insecurity:   . Worried About Charity fundraiser in the Last Year:   . Arboriculturist in the Last Year:   Transportation Needs:   . Film/video editor (Medical):   Marland Kitchen Lack of Transportation (Non-Medical):   Physical Activity:   . Days of Exercise per Week:   . Minutes of Exercise per Session:   Stress:   . Feeling of Stress :   Social Connections:   . Frequency of Communication with Friends and Family:   . Frequency of Social Gatherings with Friends and Family:   . Attends Religious Services:   . Active Member of Clubs or Organizations:   . Attends Archivist Meetings:   Marland Kitchen Marital Status:   Intimate Partner Violence:   . Fear of Current or Ex-Partner:   . Emotionally Abused:   Marland Kitchen Physically Abused:   . Sexually Abused:     PAST FAMILY HISTORY: Family History  Problem Relation Age of Onset  . Heart disease Mother 34  . Lung cancer Other        family history  . CAD Other        strong family history male and male <50  . Mental retardation Other   . Heart attack Brother 7  . Alcohol abuse Neg Hx   . Diabetes Neg Hx   . Early death Neg Hx   . Hyperlipidemia Neg Hx   . Hypertension Neg Hx   . Kidney disease Neg Hx   . Stroke Neg Hx   . Migraines Neg Hx     MEDICATIONS  Current Outpatient Medications  Medication Sig Dispense Refill  . albuterol (PROVENTIL HFA;VENTOLIN HFA) 108 (90 Base) MCG/ACT inhaler Inhale 2 puffs into the lungs every 4 (four) hours as needed for wheezing or shortness of breath.     Marland Kitchen amLODipine  (NORVASC) 5 MG tablet Take 5 mg by mouth daily.    . chlorpheniramine-HYDROcodone (TUSSIONEX) 10-8 MG/5ML SUER Take 5 mLs by mouth every 12 (twelve) hours as needed for cough. (Patient not taking: Reported on 01/02/2020) 240 mL 0  . dexamethasone (DECADRON) 1 MG tablet Take 1 tablet (1 mg total) by mouth 2 (two) times daily with a meal. 60 tablet 3  . famotidine (PEPCID) 20 MG tablet Take 20 mg by mouth 2 (two) times daily.    . folic acid (FOLVITE) 1 MG tablet Take 1 tablet (1 mg total) by mouth daily. (Patient not taking: Reported on 01/02/2020) 30 tablet 4  . levETIRAcetam (  KEPPRA) 750 MG tablet Take 1 tablet (750 mg total) by mouth 2 (two) times daily. 60 tablet 3  . LORazepam (ATIVAN) 1 MG tablet TAKE ONE TAB BY MOUTH 30 MINUTES PRIOR TO MRI OR RADIOTHERAPY. 30 tablet 0  . losartan (COZAAR) 50 MG tablet Take 50 mg by mouth daily.    . magic mouthwash SOLN Take 5 mLs by mouth 4 (four) times daily as needed for mouth pain. 1:1:1 mix-Nystatin. Benadryl and extra strength maalox. (Patient not taking: Reported on 01/02/2020) 240 mL 0  . Multiple Vitamin (MULTIVITAMIN WITH MINERALS) TABS tablet Take 1 tablet by mouth daily.    . ondansetron (ZOFRAN) 8 MG tablet TAKE 1 TABLET BY MOUTH EVERY 8 HOURS AS NEEDED FOR NAUSEA OR VOMITING. (Patient not taking: Reported on 01/02/2020) 90 tablet 0  . PROAIR HFA 108 (90 Base) MCG/ACT inhaler Inhale 2 puffs into the lungs every 4 (four) hours as needed.  5  . prochlorperazine (COMPAZINE) 10 MG tablet TAKE 1 TABLET BY MOUTH EVERY 6 HOURS AS NEEDED FOR NAUSEA OR VOMITING (Patient not taking: Reported on 01/02/2020) 90 tablet 0  . traMADol (ULTRAM) 50 MG tablet TAKE 1 TABLET BY MOUTH EVERY 12 HOURS AS NEEDED (Patient not taking: Reported on 01/02/2020) 30 tablet 0  . XARELTO 20 MG TABS tablet TAKE 1 TABLET BY MOUTH EVERY DAY WITH SUPPER (Patient taking differently: Take 20 mg by mouth daily with supper. ) 90 tablet 0   No current facility-administered medications for this  encounter.    ALLERGIES: No Known Allergies   PHYSICAL EXAM: In general this is a well appearing caucasian male in no acute distress. He's alert and oriented x4 and appropriate throughout the examination. Cardiopulmonary assessment is negative for acute distress and he exhibits normal effort. No focal neurologic changes are noted.   Impression/Plan: 1. 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 I met with the patient today and we re reviewed the findings and discussion from brain oncology conference related to his MRI scan. We discussed Dr. Ida Rogue recommendation to proceed with stereotactic radiosurgery which the patient is well familiar with. We discussed the risks, benefits, short and long-term effects of radiotherapy and the patient is interested in proceeding. Written consent is obtained, and a copy of the consent is provided to the patient and the original put in the chart. He will simulate today, and proceed with treatment on Friday, 02/26/2020. 2. Claustrophobia. The patient has taken Ativan prior to coming and has a driver, he is planning to do the same prior to treatment and for additional imaging. He is encouraged to let us know if and when he needs any refills for Ativan prior to treatment and surveillance scans.  In a visit lasting 45 minutes, greater than 50% of the time was spent face to face discussing the patient's condition, in preparation for the discussion, and coordinating the patient's care.     Carola Rhine, PAC

## 2020-02-09 NOTE — Telephone Encounter (Signed)
Scheduled appt per 4/29 sch message - unable to reach pt . Left message with apt date and time

## 2020-02-12 ENCOUNTER — Other Ambulatory Visit: Payer: Self-pay | Admitting: Internal Medicine

## 2020-02-12 ENCOUNTER — Encounter: Payer: Self-pay | Admitting: Radiation Oncology

## 2020-02-14 ENCOUNTER — Ambulatory Visit: Payer: 59

## 2020-02-14 ENCOUNTER — Ambulatory Visit: Payer: 59 | Admitting: Internal Medicine

## 2020-02-14 ENCOUNTER — Other Ambulatory Visit: Payer: 59

## 2020-02-14 DIAGNOSIS — C7931 Secondary malignant neoplasm of brain: Secondary | ICD-10-CM | POA: Diagnosis not present

## 2020-02-14 DIAGNOSIS — C3492 Malignant neoplasm of unspecified part of left bronchus or lung: Secondary | ICD-10-CM | POA: Diagnosis not present

## 2020-02-16 ENCOUNTER — Ambulatory Visit
Admission: RE | Admit: 2020-02-16 | Discharge: 2020-02-16 | Disposition: A | Discharge status: Home / Self Care | Payer: Medicare Other | Source: Ambulatory Visit | Attending: Radiation Oncology | Admitting: Radiation Oncology

## 2020-02-16 ENCOUNTER — Other Ambulatory Visit: Payer: Self-pay

## 2020-02-16 DIAGNOSIS — C7931 Secondary malignant neoplasm of brain: Secondary | ICD-10-CM | POA: Diagnosis not present

## 2020-02-16 NOTE — Progress Notes (Signed)
Pharmacist Chemotherapy Monitoring - Follow Up Assessment    I verify that I have reviewed each item in the below checklist:  . Regimen for the patient is scheduled for the appropriate day and plan matches scheduled date. Marland Kitchen Appropriate non-routine labs are ordered dependent on drug ordered. . If applicable, additional medications reviewed and ordered per protocol based on lifetime cumulative doses and/or treatment regimen.   Plan for follow-up and/or issues identified: Yes . I-vent associated with next due treatment: Yes . MD and/or nursing notified: No  Britt Boozer 02/16/2020 8:11 AM

## 2020-02-19 ENCOUNTER — Ambulatory Visit (HOSPITAL_COMMUNITY): Payer: 59

## 2020-02-20 ENCOUNTER — Telehealth: Payer: Self-pay | Admitting: Medical Oncology

## 2020-02-20 NOTE — Telephone Encounter (Signed)
Wife notified.

## 2020-02-20 NOTE — Telephone Encounter (Signed)
Schedule message sent to r/s CT scan.

## 2020-02-21 ENCOUNTER — Ambulatory Visit (HOSPITAL_COMMUNITY)
Admission: RE | Admit: 2020-02-21 | Discharge: 2020-02-21 | Disposition: A | Discharge status: Home / Self Care | Payer: Medicare Other | Source: Ambulatory Visit | Attending: Internal Medicine | Admitting: Internal Medicine

## 2020-02-21 ENCOUNTER — Other Ambulatory Visit: Payer: Self-pay

## 2020-02-21 ENCOUNTER — Encounter (HOSPITAL_COMMUNITY): Payer: Self-pay

## 2020-02-21 DIAGNOSIS — C3490 Malignant neoplasm of unspecified part of unspecified bronchus or lung: Secondary | ICD-10-CM | POA: Diagnosis present

## 2020-02-21 MED ORDER — SODIUM CHLORIDE (PF) 0.9 % IJ SOLN
INTRAMUSCULAR | Status: AC
  Filled 2020-02-21: qty 50

## 2020-02-21 MED ORDER — IOHEXOL 300 MG/ML  SOLN
100.0000 mL | Freq: Once | INTRAMUSCULAR | Status: AC | PRN
  Administered 2020-02-21: 100 mL via INTRAVENOUS

## 2020-02-22 ENCOUNTER — Inpatient Hospital Stay: Payer: Medicare Other

## 2020-02-22 ENCOUNTER — Inpatient Hospital Stay (HOSPITAL_BASED_OUTPATIENT_CLINIC_OR_DEPARTMENT_OTHER): Payer: Medicare Other | Admitting: Internal Medicine

## 2020-02-22 ENCOUNTER — Inpatient Hospital Stay: Payer: Medicare Other | Attending: Internal Medicine

## 2020-02-22 ENCOUNTER — Encounter: Payer: Self-pay | Admitting: Internal Medicine

## 2020-02-22 ENCOUNTER — Other Ambulatory Visit: Payer: Self-pay

## 2020-02-22 VITALS — HR 99

## 2020-02-22 VITALS — BP 130/91 | HR 126 | Temp 98.5°F | Ht 73.0 in | Wt 251.1 lb

## 2020-02-22 DIAGNOSIS — Z79899 Other long term (current) drug therapy: Secondary | ICD-10-CM | POA: Diagnosis not present

## 2020-02-22 DIAGNOSIS — Z5112 Encounter for antineoplastic immunotherapy: Secondary | ICD-10-CM | POA: Diagnosis present

## 2020-02-22 DIAGNOSIS — Z9221 Personal history of antineoplastic chemotherapy: Secondary | ICD-10-CM | POA: Insufficient documentation

## 2020-02-22 DIAGNOSIS — C3492 Malignant neoplasm of unspecified part of left bronchus or lung: Secondary | ICD-10-CM

## 2020-02-22 DIAGNOSIS — C7B8 Other secondary neuroendocrine tumors: Secondary | ICD-10-CM | POA: Diagnosis not present

## 2020-02-22 DIAGNOSIS — R000 Tachycardia, unspecified: Secondary | ICD-10-CM | POA: Insufficient documentation

## 2020-02-22 DIAGNOSIS — G936 Cerebral edema: Secondary | ICD-10-CM | POA: Diagnosis not present

## 2020-02-22 DIAGNOSIS — C7A1 Malignant poorly differentiated neuroendocrine tumors: Secondary | ICD-10-CM | POA: Insufficient documentation

## 2020-02-22 DIAGNOSIS — Z923 Personal history of irradiation: Secondary | ICD-10-CM | POA: Insufficient documentation

## 2020-02-22 DIAGNOSIS — R5383 Other fatigue: Secondary | ICD-10-CM

## 2020-02-22 LAB — CMP (CANCER CENTER ONLY)
ALT: 38 U/L (ref 0–44)
AST: 16 U/L (ref 15–41)
Albumin: 3.2 g/dL — ABNORMAL LOW (ref 3.5–5.0)
Alkaline Phosphatase: 91 U/L (ref 38–126)
Anion gap: 11 (ref 5–15)
BUN: 12 mg/dL (ref 6–20)
CO2: 26 mmol/L (ref 22–32)
Calcium: 9.5 mg/dL (ref 8.9–10.3)
Chloride: 101 mmol/L (ref 98–111)
Creatinine: 1.24 mg/dL (ref 0.61–1.24)
GFR, Est AFR Am: >60 mL/min (ref >60)
GFR, Estimated: >60 mL/min (ref >60)
Glucose, Bld: 147 mg/dL — ABNORMAL HIGH (ref 70–99)
Potassium: 3.5 mmol/L (ref 3.5–5.1)
Sodium: 138 mmol/L (ref 135–145)
Total Bilirubin: 0.8 mg/dL (ref 0.3–1.2)
Total Protein: 7.2 g/dL (ref 6.5–8.1)

## 2020-02-22 LAB — CBC WITH DIFFERENTIAL (CANCER CENTER ONLY)
Abs Immature Granulocytes: 0.04 10*3/uL (ref 0.00–0.07)
Basophils Absolute: 0 10*3/uL (ref 0.0–0.1)
Basophils Relative: 0 %
Eosinophils Absolute: 0 10*3/uL (ref 0.0–0.5)
Eosinophils Relative: 0 %
HCT: 40.8 % (ref 39.0–52.0)
Hemoglobin: 13.9 g/dL (ref 13.0–17.0)
Immature Granulocytes: 1 %
Lymphocytes Relative: 13 %
Lymphs Abs: 0.7 10*3/uL (ref 0.7–4.0)
MCH: 33.3 pg (ref 26.0–34.0)
MCHC: 34.1 g/dL (ref 30.0–36.0)
MCV: 97.6 fL (ref 80.0–100.0)
Monocytes Absolute: 0.5 10*3/uL (ref 0.1–1.0)
Monocytes Relative: 9 %
Neutro Abs: 4.1 10*3/uL (ref 1.7–7.7)
Neutrophils Relative %: 77 %
Platelet Count: 163 10*3/uL (ref 150–400)
RBC: 4.18 MIL/uL — ABNORMAL LOW (ref 4.22–5.81)
RDW: 13.9 % (ref 11.5–15.5)
WBC Count: 5.3 10*3/uL (ref 4.0–10.5)
nRBC: 0 % (ref 0.0–0.2)

## 2020-02-22 LAB — TSH: TSH: 2.471 u[IU]/mL (ref 0.320–4.118)

## 2020-02-22 MED ORDER — SODIUM CHLORIDE 0.9 % IV SOLN
480.0000 mg | Freq: Once | INTRAVENOUS | Status: AC
  Administered 2020-02-22: 480 mg via INTRAVENOUS
  Filled 2020-02-22: qty 48

## 2020-02-22 MED ORDER — SODIUM CHLORIDE 0.9 % IV SOLN
Freq: Once | INTRAVENOUS | Status: AC
  Filled 2020-02-22: qty 250

## 2020-02-22 NOTE — Patient Instructions (Signed)
[facility] Discharge Instructions for Patients Receiving Chemotherapy  Today you received the following chemotherapy agents: nivolumab.  To help prevent nausea and vomiting after your treatment, we encourage you to take your nausea medication as directed.   If you develop nausea and vomiting that is not controlled by your nausea medication, call the clinic.   BELOW ARE SYMPTOMS THAT SHOULD BE REPORTED IMMEDIATELY:  *FEVER GREATER THAN 100.5 F  *CHILLS WITH OR WITHOUT FEVER  NAUSEA AND VOMITING THAT IS NOT CONTROLLED WITH YOUR NAUSEA MEDICATION  *UNUSUAL SHORTNESS OF BREATH  *UNUSUAL BRUISING OR BLEEDING  TENDERNESS IN MOUTH AND THROAT WITH OR WITHOUT PRESENCE OF ULCERS  *URINARY PROBLEMS  *BOWEL PROBLEMS  UNUSUAL RASH Items with * indicate a potential emergency and should be followed up as soon as possible.  Feel free to call the clinic should you have any questions or concerns. The clinic phone number is [phone].  Please show the CHEMO ALERT CARD at check-in to the Emergency Department and triage nurse.   

## 2020-02-22 NOTE — Progress Notes (Signed)
[facility] [phone]   [phone]  OFFICE PROGRESS NOTE  [name], MD [address] [place] [address]  DIAGNOSIS: stage IV (T1a, N2, M1b) non-small cell lung cancer consistent with poorly differentiated high-grade neuroendocrine carcinoma presented with small left lower lobe pulmonary nodule in addition to left hilar and subcarinal lymphadenopathy as well as liver and brain metastasis diagnosed in March 2018.  PRIOR THERAPY:  1) Stereotactic radiotherapy to a solitary brain metastasis under the care of Dr. Lisbeth Renshaw on 01/25/2017. 2) Short course of concurrent chemoradiation with weekly carboplatin for AUC of 2 and paclitaxel 45 MG/M2 to the locally advanced disease in the chest. Status post 3 cycles. 3) Systemic chemotherapy with carboplatin for AUC of 5, Alimta 500 MG/M2 and Avastin 15 MG/KG every 3 weeks. First dose of 02/15/2017. Status post 6 cycles. Last dose 06/08/2017 with stable disease. 4) whole brain irradiation under the care of Dr. Lisbeth Renshaw expected to complete on October 14, 2018. 5) Maintenance systemic chemotherapy with Alimta 500 MG/M2 in addition to Avastin 15 MG/KG every 3 weeks. First dose 07/13/2017.  Status post 20 cycles.  Starting from cycle #16 he will be treated with single agent Alimta only secondary to intolerance.   CURRENT THERAPY: Second line treatment with immunotherapy with nivolumab 480 mg IV every 4 weeks.  First dose October 26, 2018.  Status post 17 cycles.  INTERVAL HISTORY: Calvin Wagner 54 y.o. male returns to the clinic today for follow-up visit accompanied by his wife.  The patient is feeling fine today with no concerning complaints.  He did not several pounds because of the steroid treatment for the vasogenic edema.  He is currently on Decadron 1 mg p.o. twice daily.  He denied having any current chest pain, shortness of breath, cough or hemoptysis.  He denied having any fever or chills.   He has no nausea, vomiting, diarrhea or constipation.  He has no headache or visual changes.  He has been tolerating his treatment with nivolumab fairly well.  The patient had repeat CT scan of the chest, abdomen pelvis performed recently and he is here for evaluation and discussion of his scan results.  MEDICAL HISTORY: Past Medical History:  Diagnosis Date  . Encounter for antineoplastic chemotherapy 12/31/2016  . GERD 11/08/2009   Qualifier: Diagnosis of  By: Ronnald Ramp MD, Arvid Right Goals of care, counseling/discussion 12/31/2016  . History of radiation therapy 01/13/2017 - 02/02/2017   Site/dose:  Left lung: 30 Gy in 15 fractions  . HYPERTENSION 11/08/2009   Qualifier: Diagnosis of  By: Ronnald Ramp MD, Arvid Right.   . Large cell carcinoma of left lung, stage 4 ([place]) 12/31/2016  . Migraine 02/25/2012  . Pneumonia   . PONV (postoperative nausea and vomiting)   . Rotator cuff tear, left     ALLERGIES:  has No Known Allergies.  MEDICATIONS:  Current Outpatient Medications  Medication Sig Dispense Refill  . albuterol (PROVENTIL HFA;VENTOLIN HFA) 108 (90 Base) MCG/ACT inhaler Inhale 2 puffs into the lungs every 4 (four) hours as needed for wheezing or shortness of breath.     Marland Kitchen amLODipine (NORVASC) 5 MG tablet Take 5 mg by mouth daily.    . chlorpheniramine-HYDROcodone (TUSSIONEX) 10-8 MG/5ML SUER Take 5 mLs by mouth every 12 (twelve) hours as needed for cough. (Patient not taking: Reported on 01/02/2020) 240 mL 0  . dexamethasone (DECADRON) 1 MG tablet Take 1 tablet (1 mg total) by  mouth 2 (two) times daily with a meal. 60 tablet 3  . famotidine (PEPCID) 20 MG tablet Take 20 mg by mouth 2 (two) times daily.    . folic acid (FOLVITE) 1 MG tablet Take 1 tablet (1 mg total) by mouth daily. (Patient not taking: Reported on 01/02/2020) 30 tablet 4  . levETIRAcetam (KEPPRA) 750 MG tablet Take 1 tablet (750 mg total) by mouth 2 (two) times daily. 60 tablet 3  . LORazepam (ATIVAN) 1 MG tablet TAKE ONE TAB BY  MOUTH 30 MINUTES PRIOR TO MRI OR RADIOTHERAPY. 30 tablet 0  . losartan (COZAAR) 50 MG tablet Take 50 mg by mouth daily.    . magic mouthwash SOLN Take 5 mLs by mouth 4 (four) times daily as needed for mouth pain. 1:1:1 mix-Nystatin. Benadryl and extra strength maalox. (Patient not taking: Reported on 01/02/2020) 240 mL 0  . Multiple Vitamin (MULTIVITAMIN WITH MINERALS) TABS tablet Take 1 tablet by mouth daily.    . ondansetron (ZOFRAN) 8 MG tablet TAKE 1 TABLET BY MOUTH EVERY 8 HOURS AS NEEDED FOR NAUSEA OR VOMITING. (Patient not taking: Reported on 01/02/2020) 90 tablet 0  . PROAIR HFA 108 (90 Base) MCG/ACT inhaler Inhale 2 puffs into the lungs every 4 (four) hours as needed.  5  . prochlorperazine (COMPAZINE) 10 MG tablet TAKE 1 TABLET BY MOUTH EVERY 6 HOURS AS NEEDED FOR NAUSEA OR VOMITING (Patient not taking: Reported on 01/02/2020) 90 tablet 0  . traMADol (ULTRAM) 50 MG tablet TAKE 1 TABLET BY MOUTH EVERY 12 HOURS AS NEEDED (Patient not taking: Reported on 01/02/2020) 30 tablet 0  . XARELTO 20 MG TABS tablet TAKE 1 TABLET BY MOUTH EVERY DAY WITH SUPPER (Patient taking differently: Take 20 mg by mouth daily with supper. ) 90 tablet 0   No current facility-administered medications for this visit.    SURGICAL HISTORY:  Past Surgical History:  Procedure Laterality Date  . INGUINAL HERNIA REPAIR    . SHOULDER ARTHROSCOPY WITH ROTATOR CUFF REPAIR Left 11/12/2016   Procedure: SHOULDER ARTHROSCOPY WITH ROTATOR CUFF REPAIR AND SUBACROMIAL DECOMPRESSION;  Surgeon: Tania Ade, MD;  Location: [facility];  Service: Orthopedics;  Laterality: Left;  SHOULDER ARTHROSCOPY WITH ROTATOR CUFF REPAIR AND SUBACROMIAL DECOMPRESSION  . TONSILLECTOMY    . TOTAL KNEE ARTHROPLASTY    . VIDEO BRONCHOSCOPY Bilateral 12/10/2016   Procedure: VIDEO BRONCHOSCOPY WITHOUT FLUORO;  Surgeon: Tanda Rockers, MD;  Location: [facility];  Service: Cardiopulmonary;  Laterality: Bilateral;    REVIEW OF SYSTEMS:  Constitutional:  positive for fatigue Eyes: negative Ears, nose, mouth, throat, and face: negative Respiratory: negative Cardiovascular: negative Gastrointestinal: negative Genitourinary:negative Integument/breast: negative Hematologic/lymphatic: negative Musculoskeletal:negative Neurological: negative Behavioral/Psych: negative Endocrine: negative Allergic/Immunologic: negative   PHYSICAL EXAMINATION: General appearance: alert, cooperative and no distress Head: Normocephalic, without obvious abnormality, atraumatic Neck: no adenopathy, no JVD, supple, symmetrical, trachea midline and thyroid not enlarged, symmetric, no tenderness/mass/nodules Lymph nodes: Cervical, supraclavicular, and axillary nodes normal. Resp: clear to auscultation bilaterally Back: symmetric, no curvature. ROM normal. No CVA tenderness. Cardio: regular rate and rhythm, S1, S2 normal, no murmur, click, rub or gallop GI: soft, non-tender; bowel sounds normal; no masses,  no organomegaly Extremities: extremities normal, atraumatic, no cyanosis or edema Neurologic: Alert and oriented X 3, normal strength and tone. Normal symmetric reflexes. Normal coordination and gait  ECOG PERFORMANCE STATUS: 1 - Symptomatic but completely ambulatory  Blood pressure (!) 130/91, pulse (!) 126, temperature 98.5 F (36.9 C), temperature source Temporal, height 6\' 1"  (1.854  m), weight 251 lb 1.6 oz (113.9 kg), SpO2 100 %.  LABORATORY DATA: Lab Results  Component Value Date   WBC 5.3 02/22/2020   HGB 13.9 02/22/2020   HCT 40.8 02/22/2020   MCV 97.6 02/22/2020   PLT 163 02/22/2020      Chemistry      Component Value Date/Time   NA 138 02/22/2020 1115   NA 138 10/08/2017 0907   K 3.5 02/22/2020 1115   K 4.1 10/08/2017 0907   CL 101 02/22/2020 1115   CO2 26 02/22/2020 1115   CO2 24 10/08/2017 0907   BUN 12 02/22/2020 1115   BUN 11.7 10/08/2017 0907   CREATININE 1.24 02/22/2020 1115   CREATININE 1.2 10/08/2017 0907      Component  Value Date/Time   CALCIUM 9.5 02/22/2020 1115   CALCIUM 9.5 10/08/2017 0907   ALKPHOS 91 02/22/2020 1115   ALKPHOS 88 10/08/2017 0907   AST 16 02/22/2020 1115   AST 22 10/08/2017 0907   ALT 38 02/22/2020 1115   ALT 24 10/08/2017 0907   BILITOT 0.8 02/22/2020 1115   BILITOT 0.42 10/08/2017 0907       RADIOGRAPHIC STUDIES: MR Brain W Wo Contrast  Result Date: 02/01/2020 CLINICAL DATA:  Lung carcinoma, stage IV. Assessment of treatment response. EXAM: MRI HEAD WITHOUT AND WITH CONTRAST TECHNIQUE: Multiplanar, multiecho pulse sequences of the brain and surrounding structures were obtained without and with intravenous contrast. CONTRAST:  52mL MULTIHANCE GADOBENATE DIMEGLUMINE 529 MG/ML IV SOLN COMPARISON:  Brain MRI 10/09/2019 FINDINGS: Brain: No acute infarct. The area of hyperintense T2-weighted signal within the left frontal white matter has slightly decreased. The abnormal signal in the left frontal operculum has worsened. There is also a small area hyperintense T2-weighted signal in the superior right parietal lobe, new since the prior study. There is generalized atrophy without lobar predilection. Normal midline structures. There are multiple contrast-enhancing lesions: 1. Superior cerebellar vermis, 6 mm, previously 2 mm, series 19, image 66 2. Inferior left frontal lobe, 3.1 x 1.8 cm, previously 3.9 x 2.7 cm, image 100 3. Left frontal operculum, 13 mm, previously 6 mm, image 99 4. Superior right parietal lobe, 5 mm, new since the prior study, image 19 Vascular: Normal flow voids. Skull and upper cervical spine: Normal marrow signal. Sinuses/Orbits: Negative. Other: None. IMPRESSION: Four discrete intracranial lesions, 3 of which are increased in size from prior study. Electronically Signed   By: Ulyses Jarred M.D.   On: 02/01/2020 22:52    ASSESSMENT AND PLAN: This is a very pleasant 54 years old white male with stage IV non-small cell lung cancer, high-grade neuroendocrine carcinoma  presented with locally advanced disease in the chest as well as solitary brain metastasis and 2 liver lesions. The patient is status post stereotactic radiotherapy to the solitary brain metastasis as well as short course of concurrent chemoradiation to the locally advanced disease in the chest. He recently completed 6 cycles of systemic chemotherapy with carboplatin, Alimta and Avastin and tolerated this treatment fairly well. He was started on maintenance treatment with Alimta and Avastin status post 15 cycles.  Avastin was discontinued secondary to intolerance to the combination treatment.  He is currently on treatment with single agent Alimta status post 5 cycles. He has been tolerating his treatment well. Unfortunately he was recently found to have multiple metastatic brain lesion and the patient underwent whole brain irradiation. His scan showed evidence for disease progression with enlargement of left lower lobe lung nodule in addition to  hilar lymphadenopathy as well as progression of the liver lesion. He was started on treatment with nivolumab 480 mg IV every 4 weeks status post 17 cycles.  The patient has been tolerating his treatment with nivolumab fairly well. He had repeat CT scan of the chest, abdomen pelvis performed recently.  I personally and independently reviewed the scan images and discussed the results with the patient and his wife.  The final report is still pending. I did not see any concerning findings for disease progression but I will wait for the final report for confirmation. I recommended for the patient to proceed with his treatment today as planned. For the tachycardia, I strongly encouraged the patient to increase his fluid oral intake. The patient will come back for follow-up visit in 4 weeks for evaluation before the next cycle of his treatment. For the vasogenic edema, he is followed by Dr. Mickeal Skinner and currently on Decadron 1 mg p.o. twice daily and this will continue to  be tapered down. He was advised to call immediately if he has any concerning symptoms in the interval. The patient voices understanding of current disease status and treatment options and is in agreement with the current care plan. All questions were answered. The patient knows to call the clinic with any problems, questions or concerns. We can certainly see the patient much sooner if necessary.  Disclaimer: This note was dictated with voice recognition software. Similar sounding words can inadvertently be transcribed and may not be corrected upon review.

## 2020-02-23 ENCOUNTER — Telehealth: Payer: Self-pay | Admitting: Internal Medicine

## 2020-02-23 NOTE — Telephone Encounter (Signed)
Scheduled per los. Called and left msg. Mailed printout  °

## 2020-03-03 ENCOUNTER — Other Ambulatory Visit: Payer: Self-pay | Admitting: Internal Medicine

## 2020-03-03 DIAGNOSIS — I2609 Other pulmonary embolism with acute cor pulmonale: Secondary | ICD-10-CM

## 2020-03-03 DIAGNOSIS — I2782 Chronic pulmonary embolism: Secondary | ICD-10-CM

## 2020-03-08 ENCOUNTER — Other Ambulatory Visit: Payer: Self-pay

## 2020-03-08 ENCOUNTER — Inpatient Hospital Stay (HOSPITAL_BASED_OUTPATIENT_CLINIC_OR_DEPARTMENT_OTHER): Payer: Medicare Other | Admitting: Internal Medicine

## 2020-03-08 VITALS — BP 146/98 | HR 111 | Temp 97.0°F | Resp 20 | Wt 249.2 lb

## 2020-03-08 DIAGNOSIS — Z5112 Encounter for antineoplastic immunotherapy: Secondary | ICD-10-CM | POA: Diagnosis not present

## 2020-03-08 DIAGNOSIS — R569 Unspecified convulsions: Secondary | ICD-10-CM

## 2020-03-08 DIAGNOSIS — C7931 Secondary malignant neoplasm of brain: Secondary | ICD-10-CM | POA: Diagnosis not present

## 2020-03-08 MED ORDER — DEXAMETHASONE 1 MG PO TABS
1.0000 mg | ORAL_TABLET | Freq: Every day | ORAL | 3 refills | Status: DC
Start: 2020-03-08 — End: 2020-04-17

## 2020-03-08 NOTE — Progress Notes (Signed)
[facility] [place], [place] [address] [phone]   Interval Evaluation  Date of Service: 03/08/20 Patient Name: Calvin Wagner Patient MRN: [id number] Patient DOB: 1965-11-18 Provider: Ventura Sellers, MD  Identifying Statement:  HIEP OLLIS is a 54 y.o. male with Brain metastasis ([place]) [C79.31]   Primary Cancer:  Oncologic History: Oncology History Overview Note  Patient presented with onset of cough then followed with scans.       Cancer ([place]) (Resolved)  11/25/2016 Initial Diagnosis   Cancer ([place])   11/25/2016 Imaging   CTA IMPRESSION: 1. Multiple bilateral acute pulmonary emboli, segmental to subsegmental size. 2. 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 There is complete obstruction of the lingular and left lower lobe airways. 1 cm pulmonary nodule in the left lateral costophrenic sulcus.   11/26/2016 Imaging   CT Abdomen No mass or adenopathy in the abdomen or pelvis   12/10/2016 Surgery   Operation: Video Flexible fiberoptic bronchoscopy, diagnostic    12/17/2016 Imaging   PET IMPRESSION: 1. Extensive left hilar and mediastinal all hypermetabolic lymphadenopathy, compatible with underlying malignancy. These findings could reflect either primary lymphoma or bronchogenic carcinoma such as a small cell carcinoma. 2. There is also a small left lower lobe pulmonary nodule measuring 11 mm which is mildly hypermetabolic. This could potentially represent the primary bronchogenic neoplasm, but it is un usual for a small nodule of this size to result in such extensive lymphadenopathy.   01/02/2017 Imaging   MRI Brain IMPRESSION:  Positive for a solitary 13 mm rim enhancing metastasis in the left anterior inferior frontal gyrus (series 10, image 72. No associated edema or mass effect.   01/04/2017 -  Radiation Therapy   SIM Chest    01/11/2017 -  Chemotherapy   The  patient had palonosetron (ALOXI) injection 0.25 mg, 0.25 mg, Intravenous,  Once, 1 of 7 cycles  CARBOplatin (PARAPLATIN) 280 mg in sodium chloride 0.9 % 250 mL chemo infusion, 280 mg (100 % of original dose 281.6 mg), Intravenous,  Once, 1 of 7 cycles Dose modification: 281.6 mg (original dose 281.6 mg, Cycle 1)  PACLitaxel (TAXOL) 102 mg in dextrose 5 % 250 mL chemo infusion ( for chemotherapy treatment.     Large cell carcinoma of left lung, stage 4 (HCC)  12/31/2016 Initial Diagnosis   Large cell carcinoma of left lung, stage 4 ([place])   10/27/2018 -  Chemotherapy   The patient had nivolumab (OPDIVO) 480 mg in sodium chloride 0.9 % 100 mL chemo infusion, 480 mg, Intravenous, Once, 18 of 25 cycles Administration: 480 mg (10/27/2018), 480 mg (11/24/2018), 480 mg (12/22/2018), 480 mg (01/19/2019), 480 mg (02/16/2019), 480 mg (03/16/2019), 480 mg (04/10/2019), 480 mg (05/11/2019), 480 mg (06/08/2019), 480 mg (07/05/2019), 480 mg (08/02/2019), 480 mg (08/30/2019), 480 mg (09/29/2019), 480 mg (10/25/2019), 480 mg (11/22/2019), 480 mg (12/20/2019), 480 mg (01/25/2020), 480 mg (02/22/2020)  for chemotherapy treatment.     CNS Oncologic History 01/25/17: SRS Treatment PTV1Left Frontal 83mm: 20 Gy in 1 fraction 10/14/18: Whole Brain / 30 Gy in 10 fractions 11/03/19: SRS L temporal 20Gy ([place]) 02/16/20: SRS to R parietal and cerebellar vermis targets  Interval History:  ORLANDA LEMMERMAN presents to clinic for evaluation following recent radiosurgery.  No seizures since episode in March of this year.  Continues on Nivolumab with Dr. Julien Nordmann. He remains functionally independent and intact otherwise.  No issues with gait or cognition.  Currently  taking decadron 1mg  twice per day.   Medications: Current Outpatient Medications on File Prior to Visit  Medication Sig Dispense Refill  . albuterol (PROVENTIL HFA;VENTOLIN HFA) 108 (90 Base) MCG/ACT inhaler Inhale 2 puffs into the lungs every 4 (four) hours as needed for  wheezing or shortness of breath.     Marland Kitchen amLODipine (NORVASC) 5 MG tablet Take 5 mg by mouth daily.    . chlorpheniramine-HYDROcodone (TUSSIONEX) 10-8 MG/5ML SUER Take 5 mLs by mouth every 12 (twelve) hours as needed for cough. (Patient not taking: Reported on 01/02/2020) 240 mL 0  . dexamethasone (DECADRON) 1 MG tablet Take 1 tablet (1 mg total) by mouth 2 (two) times daily with a meal. 60 tablet 3  . famotidine (PEPCID) 20 MG tablet Take 20 mg by mouth 2 (two) times daily.    . folic acid (FOLVITE) 1 MG tablet Take 1 tablet (1 mg total) by mouth daily. (Patient not taking: Reported on 01/02/2020) 30 tablet 4  . levETIRAcetam (KEPPRA) 750 MG tablet Take 1 tablet (750 mg total) by mouth 2 (two) times daily. 60 tablet 3  . LORazepam (ATIVAN) 1 MG tablet TAKE ONE TAB BY MOUTH 30 MINUTES PRIOR TO MRI OR RADIOTHERAPY. 30 tablet 0  . losartan (COZAAR) 50 MG tablet Take 50 mg by mouth daily.    . magic mouthwash SOLN Take 5 mLs by mouth 4 (four) times daily as needed for mouth pain. 1:1:1 mix-Nystatin. Benadryl and extra strength maalox. (Patient not taking: Reported on 01/02/2020) 240 mL 0  . Multiple Vitamin (MULTIVITAMIN WITH MINERALS) TABS tablet Take 1 tablet by mouth daily.    . ondansetron (ZOFRAN) 8 MG tablet TAKE 1 TABLET BY MOUTH EVERY 8 HOURS AS NEEDED FOR NAUSEA OR VOMITING. (Patient not taking: Reported on 01/02/2020) 90 tablet 0  . PROAIR HFA 108 (90 Base) MCG/ACT inhaler Inhale 2 puffs into the lungs every 4 (four) hours as needed.  5  . prochlorperazine (COMPAZINE) 10 MG tablet TAKE 1 TABLET BY MOUTH EVERY 6 HOURS AS NEEDED FOR NAUSEA OR VOMITING (Patient not taking: Reported on 01/02/2020) 90 tablet 0  . rivaroxaban (XARELTO) 20 MG TABS tablet Take 1 tablet (20 mg total) by mouth daily with supper. 30 tablet 2  . traMADol (ULTRAM) 50 MG tablet TAKE 1 TABLET BY MOUTH EVERY 12 HOURS AS NEEDED (Patient not taking: Reported on 01/02/2020) 30 tablet 0   No current facility-administered medications on  file prior to visit.    Allergies: No Known Allergies Past Medical History:  Past Medical History:  Diagnosis Date  . Encounter for antineoplastic chemotherapy 12/31/2016  . GERD 11/08/2009   Qualifier: Diagnosis of  By: Ronnald Ramp MD, Arvid Right Goals of care, counseling/discussion 12/31/2016  . History of radiation therapy 01/13/2017 - 02/02/2017   Site/dose:  Left lung: 30 Gy in 15 fractions  . HYPERTENSION 11/08/2009   Qualifier: Diagnosis of  By: Ronnald Ramp MD, Arvid Right.   . Large cell carcinoma of left lung, stage 4 ([facility]) 12/31/2016  . Migraine 02/25/2012  . Pneumonia   . PONV (postoperative nausea and vomiting)   . Rotator cuff tear, left    Past Surgical History:  Past Surgical History:  Procedure Laterality Date  . INGUINAL HERNIA REPAIR    . SHOULDER ARTHROSCOPY WITH ROTATOR CUFF REPAIR Left 11/12/2016   Procedure: SHOULDER ARTHROSCOPY WITH ROTATOR CUFF REPAIR AND SUBACROMIAL DECOMPRESSION;  Surgeon: Tania Ade, MD;  Location: [facility];  Service: Orthopedics;  Laterality: Left;  SHOULDER ARTHROSCOPY  WITH ROTATOR CUFF REPAIR AND SUBACROMIAL DECOMPRESSION  . TONSILLECTOMY    . TOTAL KNEE ARTHROPLASTY    . VIDEO BRONCHOSCOPY Bilateral 12/10/2016   Procedure: VIDEO BRONCHOSCOPY WITHOUT FLUORO;  Surgeon: Tanda Rockers, MD;  Location: [facility];  Service: Cardiopulmonary;  Laterality: Bilateral;   Social History:  Social History   Socioeconomic History  . Marital status: Married    Spouse name: Baker Janus  . Number of children: 2  . Years of education: Not on file  . Highest education level: Not on file  Occupational History  . Occupation: Material Therapist, sports: [employer]  Tobacco Use  . Smoking status: Never Smoker  . Smokeless tobacco: Never Used  Substance and Sexual Activity  . Alcohol use: Yes    Comment: social  . Drug use: No  . Sexual activity: Not Currently  Other Topics Concern  . Not on file  Social History Narrative   Lives at home wife and 2 kids    Caffeine use: none     Social Determinants of Health   Financial Resource Strain:   . Difficulty of Paying Living Expenses:   Food Insecurity:   . Worried About Charity fundraiser in the Last Year:   . Arboriculturist in the Last Year:   Transportation Needs:   . Film/video editor (Medical):   Marland Kitchen Lack of Transportation (Non-Medical):   Physical Activity:   . Days of Exercise per Week:   . Minutes of Exercise per Session:   Stress:   . Feeling of Stress :   Social Connections:   . Frequency of Communication with Friends and Family:   . Frequency of Social Gatherings with Friends and Family:   . Attends Religious Services:   . Active Member of Clubs or Organizations:   . Attends Archivist Meetings:   Marland Kitchen Marital Status:   Intimate Partner Violence:   . Fear of Current or Ex-Partner:   . Emotionally Abused:   Marland Kitchen Physically Abused:   . Sexually Abused:    Family History:  Family History  Problem Relation Age of Onset  . Heart disease Mother 94  . Lung cancer Other        family history  . CAD Other        strong family history male and male <50  . Mental retardation Other   . Heart attack Brother 60  . Alcohol abuse Neg Hx   . Diabetes Neg Hx   . Early death Neg Hx   . Hyperlipidemia Neg Hx   . Hypertension Neg Hx   . Kidney disease Neg Hx   . Stroke Neg Hx   . Migraines Neg Hx     Review of Systems: Constitutional: Denies fevers, chills or abnormal weight loss Eyes: Denies blurriness of vision Ears, nose, mouth, throat, and face: Denies mucositis or sore throat Respiratory: Denies cough, dyspnea or wheezes Cardiovascular: Denies palpitation, chest discomfort or lower extremity swelling Gastrointestinal:  Denies nausea, constipation, diarrhea GU: Denies dysuria or incontinence Skin: Denies abnormal skin rashes Neurological: Per HPI Musculoskeletal: Denies joint pain, back or neck discomfort. No decrease in ROM Behavioral/Psych: Denies  anxiety, disturbance in thought content, and mood instability   Physical Exam: Vitals:   03/08/20 1140  BP: (!) 146/98  Pulse: (!) 111  Resp: 20  Temp: (!) 97 F (36.1 C)  SpO2: 100%   KPS: 90. General: Alert, cooperative, pleasant, in no acute distress Head: Normal  EENT: No conjunctival injection or scleral icterus. Oral mucosa moist Lungs: Resp effort normal Cardiac: Regular rate and rhythm Abdomen: Soft, non-distended abdomen Skin: No rashes cyanosis or petechiae. Extremities: No clubbing or edema  Neurologic Exam: Mental Status: Awake, alert, attentive to examiner. Oriented to self and environment. Language is fluent with intact comprehension.  Cranial Nerves: Visual acuity is grossly normal. Visual fields are full. Extra-ocular movements intact. No ptosis. Face is symmetric, tongue midline. Motor: Tone and bulk are normal. Power is full in both arms and legs. Reflexes are symmetric, no pathologic reflexes present. Intact finger to nose bilaterally Sensory: Intact to light touch and temperature Gait: Normal    Labs: I have reviewed the data as listed    Component Value Date/Time   NA 138 02/22/2020 1115   NA 138 10/08/2017 0907   K 3.5 02/22/2020 1115   K 4.1 10/08/2017 0907   CL 101 02/22/2020 1115   CO2 26 02/22/2020 1115   CO2 24 10/08/2017 0907   GLUCOSE 147 (H) 02/22/2020 1115   GLUCOSE 138 10/08/2017 0907   BUN 12 02/22/2020 1115   BUN 11.7 10/08/2017 0907   CREATININE 1.24 02/22/2020 1115   CREATININE 1.2 10/08/2017 0907   CALCIUM 9.5 02/22/2020 1115   CALCIUM 9.5 10/08/2017 0907   PROT 7.2 02/22/2020 1115   PROT 7.5 10/08/2017 0907   ALBUMIN 3.2 (L) 02/22/2020 1115   ALBUMIN 3.6 10/08/2017 0907   AST 16 02/22/2020 1115   AST 22 10/08/2017 0907   ALT 38 02/22/2020 1115   ALT 24 10/08/2017 0907   ALKPHOS 91 02/22/2020 1115   ALKPHOS 88 10/08/2017 0907   BILITOT 0.8 02/22/2020 1115   BILITOT 0.42 10/08/2017 0907   GFRNONAA >60 02/22/2020 1115    GFRAA >60 02/22/2020 1115   Lab Results  Component Value Date   WBC 5.3 02/22/2020   NEUTROABS 4.1 02/22/2020   HGB 13.9 02/22/2020   HCT 40.8 02/22/2020   MCV 97.6 02/22/2020   PLT 163 02/22/2020    Assessment/Plan Brain Metastases  Mr. Lares is clinically stable today after recent radiosurgery for two novel metastases.  No further seizure events.  Decadron should be reduced to 1mg  daily x10 days, then stopped.  We recommended continuation of Keppra 500mg  BID.  We appreciate the opportunity to participate in the care of LOYD MARHEFKA.  We ask that MASSEY RUHLAND return to clinic in 2 months following next brain MRI, or sooner as needed.  All questions were answered. The patient knows to call the clinic with any problems, questions or concerns. No barriers to learning were detected.  The total time spent in the encounter was 30 minutes and more than 50% was on counseling and review of test results   Ventura Sellers, MD Medical Director of Neuro-Oncology [facility] at [place] 03/08/20 11:45 AM

## 2020-03-10 NOTE — Progress Notes (Addendum)
  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     Indication for treatment:  palliative       Radiation treatment dates:   02/16/20  Site/dose:    1.  PTV3 Sup Vermis 28mm was treated to a dose of 20 Gy in 1 fration using 3 DCA's. 2.  PTV4 Sup Rt Parietal 52mm was treated to a dose of 20 Gy in 1 fration using 3 DCA's.  Narrative: The patient tolerated radiation treatment well.   There were no signs of acute toxicity after treatment.  Plan: The patient has completed radiation treatment. The patient will return to radiation oncology clinic for routine followup in one month. I advised the patient to call or return sooner if they have any questions or concerns related to their recovery or treatment. ________________________________  ------------------------------------------------  Jodelle Gross, MD, PhD

## 2020-03-12 ENCOUNTER — Telehealth: Payer: Self-pay | Admitting: Internal Medicine

## 2020-03-12 NOTE — Telephone Encounter (Signed)
Scheduled appt per 5/28 los.  Pt will get an updated appt calendar at their next scheduled appt.

## 2020-03-13 ENCOUNTER — Other Ambulatory Visit: Payer: 59

## 2020-03-13 ENCOUNTER — Ambulatory Visit: Payer: 59

## 2020-03-13 ENCOUNTER — Telehealth: Payer: Self-pay | Admitting: Physician Assistant

## 2020-03-13 ENCOUNTER — Ambulatory Visit: Payer: 59 | Admitting: Physician Assistant

## 2020-03-13 NOTE — Telephone Encounter (Signed)
R/s appt per 6/1 sch message - unable to reach pt- left message with appt date and time

## 2020-03-18 ENCOUNTER — Telehealth: Payer: Self-pay | Admitting: Radiation Oncology

## 2020-03-20 NOTE — Progress Notes (Signed)
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  PRIOR THERAPY: 1) Stereotactic radiotherapy to a solitary brain metastasis under the care of Dr. Lisbeth Renshaw on 01/25/2017. 2) Short course of concurrent chemoradiation with weekly carboplatin for AUC of 2 and paclitaxel 45 MG/M2 to the locally advanced disease in the chest. Status post 3 cycles. 3) Systemic chemotherapy with carboplatin for AUC of 5, Alimta 500 MG/M2 and Avastin 15 MG/KG every 3 weeks. First dose of 02/15/2017. Status post 6 cycles. Last dose 06/08/2017 with stable disease. 4) whole brain irradiation under the care of Dr. Lisbeth Renshaw expected to complete on October 14, 2018. 5) Maintenance systemic chemotherapy with Alimta 500 MG/M2 in addition to Avastin 15 MG/KG every 3 weeks. First dose 07/13/2017. Status post 20 cycles. Starting from cycle #16 he will be treated with single agent Alimta only secondary to intolerance. 6) SRS to the brain lesion in the left frontal progressive mass under the care of Dr. Lisbeth Renshaw on 11/03/2019 7) SRS to the new brain lesions under the care of Dr. Lisbeth Renshaw on 02/16/20  CURRENT THERAPY: Second line treatment with immunotherapy with nivolumab 480 mg IV every 4 weeks. First dose October 26, 2018. Status post 18cycles.  INTERVAL HISTORY: Calvin Wagner 54 y.o. male returns to the clinic for a follow up visit. The patient is feeling well today without any concerning complaints. The patient continues to tolerate treatment with immunotherapy with Nivolumab well without any adverse effects. Denies any fever, chills, night sweats, or weight loss.  Denies any chest pain, cough, or hemoptysis. He reports his baseline shortness of breath with exertion. Denies any nausea, vomiting, diarrhea, or constipation. Denies any headache or visual changes. Denies any recent seizures. Denies gait changes or balance changes. He is followed closely by neuro-oncology and radiation oncology for his history of brain metastasis. He is working on his decadron taper for his recent brain metastasis and SRS. Denies any rashes or skin changes. The patient is here today for evaluation prior to starting cycle # 19   MEDICAL HISTORY: Past Medical History:  Diagnosis Date  . Encounter for antineoplastic chemotherapy 12/31/2016  . GERD 11/08/2009   Qualifier: Diagnosis of  By: Ronnald Ramp MD, Arvid Right Goals of care, counseling/discussion 12/31/2016  . History of radiation therapy 01/13/2017 - 02/02/2017   Site/dose:  Left lung: 30 Gy in 15 fractions  . HYPERTENSION 11/08/2009   Qualifier: Diagnosis of  By: Ronnald Ramp MD, Arvid Right.   . 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 Migraine 02/25/2012  . Pneumonia   . PONV (postoperative nausea and vomiting)   . Rotator cuff tear, left     ALLERGIES:  has No Known Allergies.  MEDICATIONS:  Current Outpatient Medications  Medication Sig Dispense Refill  . albuterol (PROVENTIL HFA;VENTOLIN HFA) 108 (90 Base) MCG/ACT inhaler Inhale 2 puffs into the lungs every 4 (four) hours as needed for wheezing or shortness of breath.     Marland Kitchen amLODipine (NORVASC) 5 MG tablet Take 5 mg by mouth daily.    Marland Kitchen dexamethasone (DECADRON) 1 MG tablet Take 1 tablet (1 mg total) by mouth daily with breakfast. 60 tablet  3  . famotidine (PEPCID) 20 MG tablet Take 20 mg by mouth 2 (two) times daily.    Marland Kitchen levETIRAcetam (KEPPRA) 750 MG tablet Take 1 tablet (750 mg total) by mouth 2 (two) times daily. 60 tablet 3  . LORazepam (ATIVAN) 1 MG tablet TAKE ONE TAB BY MOUTH 30 MINUTES PRIOR TO MRI OR RADIOTHERAPY. 30 tablet 0  . losartan (COZAAR)  50 MG tablet Take 50 mg by mouth daily.    . Multiple Vitamin (MULTIVITAMIN WITH MINERALS) TABS tablet Take 1 tablet by mouth daily.    Marland Kitchen PROAIR HFA 108 (90 Base) MCG/ACT inhaler Inhale 2 puffs into the lungs every 4 (four) hours as needed.  5  . rivaroxaban (XARELTO) 20 MG TABS tablet Take 1 tablet (20 mg total) by mouth daily with supper. 30 tablet 2  . chlorpheniramine-HYDROcodone (TUSSIONEX) 10-8 MG/5ML SUER Take 5 mLs by mouth every 12 (twelve) hours as needed for cough. (Patient not taking: Reported on 1/61/0960) 454 mL 0  . folic acid (FOLVITE) 1 MG tablet Take 1 tablet (1 mg total) by mouth daily. (Patient not taking: Reported on 01/02/2020) 30 tablet 4  . magic mouthwash SOLN Take 5 mLs by mouth 4 (four) times daily as needed for mouth pain. 1:1:1 mix-Nystatin. Benadryl and extra strength maalox. (Patient not taking: Reported on 01/02/2020) 240 mL 0  . ondansetron (ZOFRAN) 8 MG tablet TAKE 1 TABLET BY MOUTH EVERY 8 HOURS AS NEEDED FOR NAUSEA OR VOMITING. (Patient not taking: Reported on 01/02/2020) 90 tablet 0  . prochlorperazine (COMPAZINE) 10 MG tablet TAKE 1 TABLET BY MOUTH EVERY 6 HOURS AS NEEDED FOR NAUSEA OR VOMITING (Patient not taking: Reported on 01/02/2020) 90 tablet 0  . traMADol (ULTRAM) 50 MG tablet TAKE 1 TABLET BY MOUTH EVERY 12 HOURS AS NEEDED (Patient not taking: Reported on 01/02/2020) 30 tablet 0   No current facility-administered medications for this visit.    SURGICAL HISTORY:  Past Surgical History:  Procedure Laterality Date  . INGUINAL HERNIA REPAIR    . SHOULDER ARTHROSCOPY WITH ROTATOR CUFF REPAIR Left 11/12/2016   Procedure: SHOULDER ARTHROSCOPY WITH ROTATOR CUFF REPAIR AND SUBACROMIAL DECOMPRESSION;  Surgeon: Tania Ade, MD;  Location: [facility];  Service: Orthopedics;  Laterality: Left;  SHOULDER ARTHROSCOPY WITH ROTATOR CUFF REPAIR AND SUBACROMIAL DECOMPRESSION  . TONSILLECTOMY    . TOTAL KNEE ARTHROPLASTY    . VIDEO BRONCHOSCOPY Bilateral 12/10/2016   Procedure:  VIDEO BRONCHOSCOPY WITHOUT FLUORO;  Surgeon: Tanda Rockers, MD;  Location: [facility];  Service: Cardiopulmonary;  Laterality: Bilateral;    REVIEW OF SYSTEMS:   Review of Systems  Constitutional: Negative for appetite change, chills, fatigue, fever and unexpected weight change.  HENT: Negative for mouth sores, nosebleeds, sore throat and trouble swallowing.   Eyes: Negative for eye problems and icterus.  Respiratory: Positive for dyspnea on exertion. Negative for cough, hemoptysis, and wheezing.   Cardiovascular: Negative for chest pain and leg swelling.  Gastrointestinal: Negative for abdominal pain, constipation, diarrhea, nausea and vomiting.  Genitourinary: Negative for bladder incontinence, difficulty urinating, dysuria, frequency and hematuria.   Musculoskeletal: Negative for back pain, gait problem, neck pain and neck stiffness.  Skin: Negative for itching and rash.  Neurological: Negative for dizziness, extremity weakness, gait problem, headaches, light-headedness and seizures.  Hematological: Negative for adenopathy. Does not bruise/bleed easily.  Psychiatric/Behavioral: Negative for confusion, depression and sleep disturbance. The patient is not nervous/anxious.     PHYSICAL EXAMINATION:  Blood pressure 132/88, pulse (!) 112, temperature (!) 97.3 F (  36.3 C), temperature source Temporal, resp. rate 18, height 6\' 1"  (1.854 m), weight 253 lb 9.6 oz (115 kg), SpO2 100 %.  ECOG PERFORMANCE STATUS: 1 - Symptomatic but completely ambulatory  Physical Exam  Constitutional: Oriented to person, place, and time and well-developed, well-nourished, and in no distress.  HENT:  Head: Normocephalic and atraumatic.  Mouth/Throat: Oropharynx is clear and moist. No oropharyngeal exudate.  Eyes: Conjunctivae are normal. Right eye exhibits no discharge. Left eye exhibits no discharge. No scleral icterus.  Neck: Normal range of motion. Neck supple.  Cardiovascular: Normal rate, regular  rhythm, normal heart sounds and intact distal pulses.   Pulmonary/Chest: Effort normal and breath sounds normal. No respiratory distress. No wheezes. No rales.  Abdominal: Soft. Bowel sounds are normal. Exhibits no distension and no mass. There is no tenderness.  Musculoskeletal: Normal range of motion. Exhibits no edema.  Lymphadenopathy:    No cervical adenopathy.  Neurological: Alert and oriented to person, place, and time. Exhibits normal muscle tone. Gait normal. Coordination normal.  Skin: Skin is warm and dry. No rash noted. Not diaphoretic. No erythema. No pallor.  Psychiatric: Mood, memory and judgment normal.  Vitals reviewed.  LABORATORY DATA: Lab Results  Component Value Date   WBC 5.6 03/21/2020   HGB 13.4 03/21/2020   HCT 40.1 03/21/2020   MCV 106.9 (H) 03/21/2020   PLT 218 03/21/2020      Chemistry      Component Value Date/Time   NA 139 03/21/2020 0905   NA 138 10/08/2017 0907   K 4.2 03/21/2020 0905   K 4.1 10/08/2017 0907   CL 103 03/21/2020 0905   CO2 24 03/21/2020 0905   CO2 24 10/08/2017 0907   BUN 8 03/21/2020 0905   BUN 11.7 10/08/2017 0907   CREATININE 1.27 (H) 03/21/2020 0905   CREATININE 1.2 10/08/2017 0907      Component Value Date/Time   CALCIUM 9.3 03/21/2020 0905   CALCIUM 9.5 10/08/2017 0907   ALKPHOS 101 03/21/2020 0905   ALKPHOS 88 10/08/2017 0907   AST 16 03/21/2020 0905   AST 22 10/08/2017 0907   ALT 26 03/21/2020 0905   ALT 24 10/08/2017 0907   BILITOT 0.8 03/21/2020 0905   BILITOT 0.42 10/08/2017 0907       RADIOGRAPHIC STUDIES:  CT Chest W Contrast  Result Date: 02/22/2020 CLINICAL DATA:  54 year old male with history of non-small cell lung cancer. Chemotherapy and radiation therapy now complete. Staging examination. EXAM: CT CHEST, ABDOMEN, AND PELVIS WITH CONTRAST TECHNIQUE: Multidetector CT imaging of the chest, abdomen and pelvis was performed following the standard protocol during bolus administration of intravenous  contrast. CONTRAST:  169mL OMNIPAQUE IOHEXOL 300 MG/ML  SOLN COMPARISON:  CT the chest, abdomen and pelvis 11/21/2019. FINDINGS: CT CHEST FINDINGS Cardiovascular: Heart size is normal. There is no significant pericardial fluid, thickening or pericardial calcification. Calcified atherosclerotic plaque in the left circumflex coronary arteries. Mediastinum/Nodes: Left hilar lymphadenopathy again noted measuring up to 3.1 x 2.2 x 2.7 cm (axial image 35 of series 2 and coronal image 95 of series 5), slightly increased in size compared to prior study 11/21/2019. No mediastinal or right hilar lymphadenopathy. Esophagus is unremarkable in appearance. No axillary lymphadenopathy. Lungs/Pleura: Left hilar lymphadenopathy very similar to the prior examination, as above. This continues to cause severe narrowing of several left lower lobe bronchi, particularly to the basal segments. This also causes severe stenosis or complete occlusion of the left lower lobe pulmonary vein. Areas of ground-glass attenuation, septal  thickening and regional architectural distortion in the left lung, similar to the prior examination, as well as some mild cylindrical bronchiectasis, related to prior radiation therapy. Small left pleural effusion lying dependently, stable compared to the prior examination. No definite new suspicious appearing pulmonary nodules or masses are noted. Musculoskeletal: There are no aggressive appearing lytic or blastic lesions noted in the visualized portions of the skeleton. CT ABDOMEN PELVIS FINDINGS Hepatobiliary: No suspicious cystic or solid hepatic lesions. No intra or extrahepatic biliary ductal dilatation. Gallbladder is normal in appearance. Pancreas: No pancreatic mass. No pancreatic ductal dilatation. No pancreatic or peripancreatic fluid collections or inflammatory changes. Spleen: Unremarkable. Adrenals/Urinary Tract: Subcentimeter low-attenuation lesion in the interpolar region of the left kidney, too small  to characterize, but statistically likely to represent a tiny cyst. Right kidney and right adrenal gland are normal in appearance. 2.2 x 1.6 cm left adrenal nodule is minimally increased in size compared to recent prior examinations, but has clearly progressed in size over more remote prior examinations, compatible with a metastatic lesion. No hydroureteronephrosis. Urinary bladder is nearly decompressed, but otherwise unremarkable in appearance. Stomach/Bowel: Normal appearance of the stomach. No pathologic dilatation of small bowel or colon. Normal appendix. Vascular/Lymphatic: Aortic atherosclerosis, without evidence of aneurysm or dissection in the abdominal or pelvic vasculature. No lymphadenopathy noted in the abdomen or pelvis. Reproductive: Prostate gland and seminal vesicles are unremarkable in appearance. Other: Postoperative changes of bilateral inguinal herniorrhaphy. No significant volume of ascites. No pneumoperitoneum. Musculoskeletal: There are no aggressive appearing lytic or blastic lesions noted in the visualized portions of the skeleton. IMPRESSION: 1. Left hilar lymphadenopathy appears slightly increased in size compared to the prior examination from 11/21/2019. 2. Slight increased size of 2.2 x 1.6 cm left adrenal nodule, compatible with a metastatic lesion. No other new metastatic disease noted elsewhere in the abdomen or pelvis. 3. Additional incidental findings, similar to prior studies, as above. Electronically Signed   By: Vinnie Langton M.D.   On: 02/22/2020 12:37   CT Abdomen Pelvis W Contrast  Result Date: 02/22/2020 CLINICAL DATA:  54 year old male with history of non-small cell lung cancer. Chemotherapy and radiation therapy now complete. Staging examination. EXAM: CT CHEST, ABDOMEN, AND PELVIS WITH CONTRAST TECHNIQUE: Multidetector CT imaging of the chest, abdomen and pelvis was performed following the standard protocol during bolus administration of intravenous contrast.  CONTRAST:  147mL OMNIPAQUE IOHEXOL 300 MG/ML  SOLN COMPARISON:  CT the chest, abdomen and pelvis 11/21/2019. FINDINGS: CT CHEST FINDINGS Cardiovascular: Heart size is normal. There is no significant pericardial fluid, thickening or pericardial calcification. Calcified atherosclerotic plaque in the left circumflex coronary arteries. Mediastinum/Nodes: Left hilar lymphadenopathy again noted measuring up to 3.1 x 2.2 x 2.7 cm (axial image 35 of series 2 and coronal image 95 of series 5), slightly increased in size compared to prior study 11/21/2019. No mediastinal or right hilar lymphadenopathy. Esophagus is unremarkable in appearance. No axillary lymphadenopathy. Lungs/Pleura: Left hilar lymphadenopathy very similar to the prior examination, as above. This continues to cause severe narrowing of several left lower lobe bronchi, particularly to the basal segments. This also causes severe stenosis or complete occlusion of the left lower lobe pulmonary vein. Areas of ground-glass attenuation, septal thickening and regional architectural distortion in the left lung, similar to the prior examination, as well as some mild cylindrical bronchiectasis, related to prior radiation therapy. Small left pleural effusion lying dependently, stable compared to the prior examination. No definite new suspicious appearing pulmonary nodules or masses are noted. Musculoskeletal:  There are no aggressive appearing lytic or blastic lesions noted in the visualized portions of the skeleton. CT ABDOMEN PELVIS FINDINGS Hepatobiliary: No suspicious cystic or solid hepatic lesions. No intra or extrahepatic biliary ductal dilatation. Gallbladder is normal in appearance. Pancreas: No pancreatic mass. No pancreatic ductal dilatation. No pancreatic or peripancreatic fluid collections or inflammatory changes. Spleen: Unremarkable. Adrenals/Urinary Tract: Subcentimeter low-attenuation lesion in the interpolar region of the left kidney, too small to  characterize, but statistically likely to represent a tiny cyst. Right kidney and right adrenal gland are normal in appearance. 2.2 x 1.6 cm left adrenal nodule is minimally increased in size compared to recent prior examinations, but has clearly progressed in size over more remote prior examinations, compatible with a metastatic lesion. No hydroureteronephrosis. Urinary bladder is nearly decompressed, but otherwise unremarkable in appearance. Stomach/Bowel: Normal appearance of the stomach. No pathologic dilatation of small bowel or colon. Normal appendix. Vascular/Lymphatic: Aortic atherosclerosis, without evidence of aneurysm or dissection in the abdominal or pelvic vasculature. No lymphadenopathy noted in the abdomen or pelvis. Reproductive: Prostate gland and seminal vesicles are unremarkable in appearance. Other: Postoperative changes of bilateral inguinal herniorrhaphy. No significant volume of ascites. No pneumoperitoneum. Musculoskeletal: There are no aggressive appearing lytic or blastic lesions noted in the visualized portions of the skeleton. IMPRESSION: 1. Left hilar lymphadenopathy appears slightly increased in size compared to the prior examination from 11/21/2019. 2. Slight increased size of 2.2 x 1.6 cm left adrenal nodule, compatible with a metastatic lesion. No other new metastatic disease noted elsewhere in the abdomen or pelvis. 3. Additional incidental findings, similar to prior studies, as above. Electronically Signed   By: Vinnie Langton M.D.   On: 02/22/2020 12:37     ASSESSMENT/PLAN:  This is a very pleasant 54 year old Caucasian male with stage IV non-small cell lung cancer, high-grade neuroendocrine carcinoma. He presented with locally advanced disease in the chest,a solitary brain metastasis,and 2 liver lesions. The patient was diagnosed in March 2018.  The patient is status post stereotactic radiotherapy to the solitary brain metastasisaswell with a short course of  concurrent chemoradiation to the locally advanced disease in the chest.  He then underwent 6 cycles of systemic chemotherapy with carboplatin, Alimta, and Avastin. He tolerated treatment well. He was then started on maintenance treatment with Alimta and Avastin. He is status post 15 cycles. Avastin was discontinued due to intolerance withcombination therapy. He then completed 5 more cycles of single agent Alimta. This was discontinued due to developing multiple metastatic brain lesions and progression of the liver lesion.  He thencompleted whole brain irradiation.  He is currently being treated with nivolumab 480 mg IV every 4 weeks. He is status post18cycles and is tolerating it well without any adverse effects.  He recently underwent SRS to the lesion in the left frontal lobe on 11/03/2019. He recently underwent SRS to new brain lesions on 02/16/20 under the care of Dr. Lisbeth Renshaw.    Labs were reviewed. Recommend that he proceed with cycle #19 today as scheduled.   We will see him back for a follow up visit in 4 weeks for evaluation before starting cycle #20.   The patient was advised to call immediately if he has any concerning symptoms in the interval. The patient voices understanding of current disease status and treatment options and is in agreement with the current care plan. All questions were answered. The patient knows to call the clinic with any problems, questions or concerns. We can certainly see the patient much sooner  if necessary       No orders of the defined types were placed in this encounter.    Calvert Charland L Vashawn Ekstein, PA-C 03/21/20

## 2020-03-21 ENCOUNTER — Inpatient Hospital Stay: Payer: Medicare Other

## 2020-03-21 ENCOUNTER — Inpatient Hospital Stay (HOSPITAL_BASED_OUTPATIENT_CLINIC_OR_DEPARTMENT_OTHER): Payer: Medicare Other | Admitting: Physician Assistant

## 2020-03-21 ENCOUNTER — Other Ambulatory Visit: Payer: Self-pay

## 2020-03-21 ENCOUNTER — Inpatient Hospital Stay: Payer: Medicare Other | Attending: Internal Medicine

## 2020-03-21 VITALS — HR 103

## 2020-03-21 VITALS — BP 132/88 | HR 112 | Temp 97.3°F | Resp 18 | Ht 73.0 in | Wt 253.6 lb

## 2020-03-21 DIAGNOSIS — Z5112 Encounter for antineoplastic immunotherapy: Secondary | ICD-10-CM | POA: Insufficient documentation

## 2020-03-21 DIAGNOSIS — C3492 Malignant neoplasm of unspecified part of left bronchus or lung: Secondary | ICD-10-CM

## 2020-03-21 DIAGNOSIS — R5383 Other fatigue: Secondary | ICD-10-CM

## 2020-03-21 DIAGNOSIS — C7931 Secondary malignant neoplasm of brain: Secondary | ICD-10-CM | POA: Diagnosis not present

## 2020-03-21 DIAGNOSIS — Z79899 Other long term (current) drug therapy: Secondary | ICD-10-CM | POA: Insufficient documentation

## 2020-03-21 DIAGNOSIS — C3432 Malignant neoplasm of lower lobe, left bronchus or lung: Secondary | ICD-10-CM | POA: Insufficient documentation

## 2020-03-21 DIAGNOSIS — C7A1 Malignant poorly differentiated neuroendocrine tumors: Secondary | ICD-10-CM | POA: Diagnosis present

## 2020-03-21 DIAGNOSIS — Z923 Personal history of irradiation: Secondary | ICD-10-CM | POA: Diagnosis not present

## 2020-03-21 LAB — CMP (CANCER CENTER ONLY)
ALT: 26 U/L (ref 0–44)
AST: 16 U/L (ref 15–41)
Albumin: 3.4 g/dL — ABNORMAL LOW (ref 3.5–5.0)
Alkaline Phosphatase: 101 U/L (ref 38–126)
Anion gap: 12 (ref 5–15)
BUN: 8 mg/dL (ref 6–20)
CO2: 24 mmol/L (ref 22–32)
Calcium: 9.3 mg/dL (ref 8.9–10.3)
Chloride: 103 mmol/L (ref 98–111)
Creatinine: 1.27 mg/dL — ABNORMAL HIGH (ref 0.61–1.24)
GFR, Est AFR Am: >60 mL/min (ref >60)
GFR, Estimated: >60 mL/min (ref >60)
Glucose, Bld: 177 mg/dL — ABNORMAL HIGH (ref 70–99)
Potassium: 4.2 mmol/L (ref 3.5–5.1)
Sodium: 139 mmol/L (ref 135–145)
Total Bilirubin: 0.8 mg/dL (ref 0.3–1.2)
Total Protein: 6.9 g/dL (ref 6.5–8.1)

## 2020-03-21 LAB — CBC WITH DIFFERENTIAL (CANCER CENTER ONLY)
Abs Immature Granulocytes: 0.02 10*3/uL (ref 0.00–0.07)
Basophils Absolute: 0 10*3/uL (ref 0.0–0.1)
Basophils Relative: 0 %
Eosinophils Absolute: 0 10*3/uL (ref 0.0–0.5)
Eosinophils Relative: 1 %
HCT: 40.1 % (ref 39.0–52.0)
Hemoglobin: 13.4 g/dL (ref 13.0–17.0)
Immature Granulocytes: 0 %
Lymphocytes Relative: 16 %
Lymphs Abs: 0.9 10*3/uL (ref 0.7–4.0)
MCH: 35.7 pg — ABNORMAL HIGH (ref 26.0–34.0)
MCHC: 33.4 g/dL (ref 30.0–36.0)
MCV: 106.9 fL — ABNORMAL HIGH (ref 80.0–100.0)
Monocytes Absolute: 0.3 10*3/uL (ref 0.1–1.0)
Monocytes Relative: 6 %
Neutro Abs: 4.3 10*3/uL (ref 1.7–7.7)
Neutrophils Relative %: 77 %
Platelet Count: 218 10*3/uL (ref 150–400)
RBC: 3.75 MIL/uL — ABNORMAL LOW (ref 4.22–5.81)
RDW: 18.2 % — ABNORMAL HIGH (ref 11.5–15.5)
WBC Count: 5.6 10*3/uL (ref 4.0–10.5)
nRBC: 0 % (ref 0.0–0.2)

## 2020-03-21 LAB — TSH: TSH: 1.505 u[IU]/mL (ref 0.320–4.118)

## 2020-03-21 MED ORDER — SODIUM CHLORIDE 0.9 % IV SOLN
480.0000 mg | Freq: Once | INTRAVENOUS | Status: AC
  Administered 2020-03-21: 480 mg via INTRAVENOUS
  Filled 2020-03-21: qty 48

## 2020-03-21 MED ORDER — SODIUM CHLORIDE 0.9 % IV SOLN
Freq: Once | INTRAVENOUS | Status: AC
  Filled 2020-03-21: qty 250

## 2020-03-21 NOTE — Patient Instructions (Signed)
[facility] Discharge Instructions for Patients Receiving Chemotherapy  Today you received the following chemotherapy agents Opdivo.  To help prevent nausea and vomiting after your treatment, we encourage you to take your nausea medication as directed.   If you develop nausea and vomiting that is not controlled by your nausea medication, call the clinic.   BELOW ARE SYMPTOMS THAT SHOULD BE REPORTED IMMEDIATELY:  *FEVER GREATER THAN 100.5 F  *CHILLS WITH OR WITHOUT FEVER  NAUSEA AND VOMITING THAT IS NOT CONTROLLED WITH YOUR NAUSEA MEDICATION  *UNUSUAL SHORTNESS OF BREATH  *UNUSUAL BRUISING OR BLEEDING  TENDERNESS IN MOUTH AND THROAT WITH OR WITHOUT PRESENCE OF ULCERS  *URINARY PROBLEMS  *BOWEL PROBLEMS  UNUSUAL RASH Items with * indicate a potential emergency and should be followed up as soon as possible.  Feel free to call the clinic you have any questions or concerns. The clinic phone number is [phone].  Please show the CHEMO ALERT CARD at check-in to the Emergency Department and triage nurse.    

## 2020-03-21 NOTE — Progress Notes (Signed)
Ok to treat with HR per Cassie H PA

## 2020-03-22 ENCOUNTER — Other Ambulatory Visit: Payer: Medicare Other

## 2020-03-22 ENCOUNTER — Ambulatory Visit: Payer: Medicare Other

## 2020-03-22 ENCOUNTER — Ambulatory Visit: Payer: Medicare Other | Admitting: Physician Assistant

## 2020-03-27 ENCOUNTER — Ambulatory Visit
Admission: RE | Admit: 2020-03-27 | Discharge: 2020-03-27 | Disposition: A | Discharge status: Home / Self Care | Payer: Medicare Other | Source: Ambulatory Visit | Attending: Radiation Oncology | Admitting: Radiation Oncology

## 2020-03-27 DIAGNOSIS — C3490 Malignant neoplasm of unspecified part of unspecified bronchus or lung: Secondary | ICD-10-CM

## 2020-03-27 DIAGNOSIS — C7931 Secondary malignant neoplasm of brain: Secondary | ICD-10-CM

## 2020-03-27 DIAGNOSIS — C3492 Malignant neoplasm of unspecified part of left bronchus or lung: Secondary | ICD-10-CM | POA: Insufficient documentation

## 2020-03-27 NOTE — Progress Notes (Signed)
  Radiation Oncology         [phone]) [phone] ________________________________  Name: Calvin Wagner MRN: [id number]  Date of Service: 03/27/2020  DOB: 1966-02-10  Post Treatment Telephone Note  Diagnosis:    Stage IV(T1a, N2, M1b) non-small cell lung cancer consistent with poorly differentiated high-grade neuroendocrine carcinoma of the left lung with liver and brain metastases.  Interval Since Last Radiation:    02/12/20 SRS Treatment:  PTV3 Sup Vermis 98m was treated to a dose of 20 Gy in 1 fration using 3 DCA's. PTV4 Sup Rt Parietal 559mwas treated to a dose of 20 Gy in 1 fration using 3 DCA's.  11/03/19 SRS Treatment: PTV2, left Frontal 6 mm target was treated to 20 Gy in 1 fraction  09/29/2018 - 10/14/2018: Whole Brain / 30 Gy in 10 fractions  01/13/17 - 02/02/17: Left lung: 30 Gy in 15 fractions  01/25/17 SRS Treatment: PTV1Left Frontal 1262m20 Gy in 1 fraction   Narrative: Mr. Calvin Wagner a pleasant. [age]o. 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 [facility], as well as palliative therapy in 2018. He did develop progressive disease in the brain and received whole brain radiation. 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 He did have a a new lesion in the left frontal lobe treated with SRS in January 2021. He remains on immunotherapy with Dr. MohJulien Wagner had an MRI of the brain for surveillance on 02/01/20. His case was reviewed in conference. 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 He also had a new lesion in the right parietal region measuring 5 mm, and in retrospect, there had been a 2 mm lesion in the cerebellar vermis, but currently it measures 6 mm. He met with Dr. VasMickeal Skinnero continues to manage his CNS symptoms  and recent seizure history. His case was also discussed in brain oncology conference and the plan is to proceed with SRS to the two new subcentimeter lesions and to consider adding Avastin for radionecrosis. He proceeded with SRS to the cerebellar vermis lesion and right parietal lesion on 02/12/20. He is contacted today and reports he is still taking dexamethasone and asked when he could stop this.      Impression/Plan: 1.  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 The patient will continue to be followed in the brain oncology program and follows now with Dr. VasMickeal Wagner these intervals due to his radionecrosis. I reviewed Dr. VasRenda Wagner and it sounds like he could have stopped the Dexamethasone last Monday, so I advised him to stop after today, and to let us [place] if he had any trouble with this. He is continuing Keppra as instructed. We will see him as needed and follow along with his progress in conference and as indicated in clinic. He will also continue with Dr. MohJulien Wagner   Calvin Wagner

## 2020-04-10 ENCOUNTER — Other Ambulatory Visit: Payer: Self-pay | Admitting: Radiation Oncology

## 2020-04-17 ENCOUNTER — Other Ambulatory Visit: Payer: Self-pay

## 2020-04-17 ENCOUNTER — Encounter: Payer: Self-pay | Admitting: Internal Medicine

## 2020-04-17 ENCOUNTER — Inpatient Hospital Stay: Payer: Medicare Other

## 2020-04-17 ENCOUNTER — Inpatient Hospital Stay: Payer: Medicare Other | Attending: Internal Medicine | Admitting: Internal Medicine

## 2020-04-17 VITALS — Resp 98

## 2020-04-17 VITALS — BP 138/89 | HR 111 | Temp 97.9°F | Resp 18 | Ht 73.0 in | Wt 254.3 lb

## 2020-04-17 DIAGNOSIS — C7931 Secondary malignant neoplasm of brain: Secondary | ICD-10-CM | POA: Diagnosis not present

## 2020-04-17 DIAGNOSIS — R5383 Other fatigue: Secondary | ICD-10-CM

## 2020-04-17 DIAGNOSIS — I10 Essential (primary) hypertension: Secondary | ICD-10-CM

## 2020-04-17 DIAGNOSIS — Z79899 Other long term (current) drug therapy: Secondary | ICD-10-CM | POA: Diagnosis not present

## 2020-04-17 DIAGNOSIS — C3492 Malignant neoplasm of unspecified part of left bronchus or lung: Secondary | ICD-10-CM

## 2020-04-17 DIAGNOSIS — Z923 Personal history of irradiation: Secondary | ICD-10-CM | POA: Insufficient documentation

## 2020-04-17 DIAGNOSIS — C3432 Malignant neoplasm of lower lobe, left bronchus or lung: Secondary | ICD-10-CM | POA: Diagnosis not present

## 2020-04-17 DIAGNOSIS — R000 Tachycardia, unspecified: Secondary | ICD-10-CM | POA: Diagnosis not present

## 2020-04-17 DIAGNOSIS — C3490 Malignant neoplasm of unspecified part of unspecified bronchus or lung: Secondary | ICD-10-CM | POA: Diagnosis not present

## 2020-04-17 DIAGNOSIS — Z5112 Encounter for antineoplastic immunotherapy: Secondary | ICD-10-CM | POA: Diagnosis not present

## 2020-04-17 DIAGNOSIS — C7A1 Malignant poorly differentiated neuroendocrine tumors: Secondary | ICD-10-CM | POA: Diagnosis present

## 2020-04-17 LAB — CBC WITH DIFFERENTIAL (CANCER CENTER ONLY)
Abs Immature Granulocytes: 0.01 10*3/uL (ref 0.00–0.07)
Basophils Absolute: 0 10*3/uL (ref 0.0–0.1)
Basophils Relative: 1 %
Eosinophils Absolute: 0.2 10*3/uL (ref 0.0–0.5)
Eosinophils Relative: 5 %
HCT: 46.9 % (ref 39.0–52.0)
Hemoglobin: 15.8 g/dL (ref 13.0–17.0)
Immature Granulocytes: 0 %
Lymphocytes Relative: 23 %
Lymphs Abs: 1.1 10*3/uL (ref 0.7–4.0)
MCH: 35.5 pg — ABNORMAL HIGH (ref 26.0–34.0)
MCHC: 33.7 g/dL (ref 30.0–36.0)
MCV: 105.4 fL — ABNORMAL HIGH (ref 80.0–100.0)
Monocytes Absolute: 0.4 10*3/uL (ref 0.1–1.0)
Monocytes Relative: 9 %
Neutro Abs: 2.9 10*3/uL (ref 1.7–7.7)
Neutrophils Relative %: 62 %
Platelet Count: 238 10*3/uL (ref 150–400)
RBC: 4.45 MIL/uL (ref 4.22–5.81)
RDW: 14.2 % (ref 11.5–15.5)
WBC Count: 4.7 10*3/uL (ref 4.0–10.5)
nRBC: 0 % (ref 0.0–0.2)

## 2020-04-17 LAB — CMP (CANCER CENTER ONLY)
ALT: 24 U/L (ref 0–44)
AST: 18 U/L (ref 15–41)
Albumin: 3.3 g/dL — ABNORMAL LOW (ref 3.5–5.0)
Alkaline Phosphatase: 98 U/L (ref 38–126)
Anion gap: 12 (ref 5–15)
BUN: 5 mg/dL — ABNORMAL LOW (ref 6–20)
CO2: 21 mmol/L — ABNORMAL LOW (ref 22–32)
Calcium: 9.1 mg/dL (ref 8.9–10.3)
Chloride: 105 mmol/L (ref 98–111)
Creatinine: 1.28 mg/dL — ABNORMAL HIGH (ref 0.61–1.24)
GFR, Est AFR Am: >60 mL/min (ref >60)
GFR, Estimated: >60 mL/min (ref >60)
Glucose, Bld: 173 mg/dL — ABNORMAL HIGH (ref 70–99)
Potassium: 3.8 mmol/L (ref 3.5–5.1)
Sodium: 138 mmol/L (ref 135–145)
Total Bilirubin: 0.8 mg/dL (ref 0.3–1.2)
Total Protein: 6.4 g/dL — ABNORMAL LOW (ref 6.5–8.1)

## 2020-04-17 LAB — TSH: TSH: 3.607 u[IU]/mL (ref 0.320–4.118)

## 2020-04-17 MED ORDER — SODIUM CHLORIDE 0.9 % IV SOLN
480.0000 mg | Freq: Once | INTRAVENOUS | Status: AC
  Administered 2020-04-17: 480 mg via INTRAVENOUS
  Filled 2020-04-17: qty 48

## 2020-04-17 MED ORDER — SODIUM CHLORIDE 0.9 % IV SOLN
Freq: Once | INTRAVENOUS | Status: AC
  Filled 2020-04-17: qty 250

## 2020-04-17 NOTE — Progress Notes (Signed)
[place] [phone]   [phone]  OFFICE PROGRESS NOTE  [name], MD 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  PRIOR THERAPY:  1) Stereotactic radiotherapy to a solitary brain metastasis under the care of Dr. Lisbeth Renshaw on 01/25/2017. 2) Short course of concurrent chemoradiation with weekly carboplatin for AUC of 2 and paclitaxel 45 MG/M2 to the locally advanced disease in the chest. Status post 3 cycles. 3) Systemic chemotherapy with carboplatin for AUC of 5, Alimta 500 MG/M2 and Avastin 15 MG/KG every 3 weeks. First dose of 02/15/2017. Status post 6 cycles. Last dose 06/08/2017 with stable disease. 4) whole brain irradiation under the care of Dr. Lisbeth Renshaw expected to complete on October 14, 2018. 5) Maintenance systemic chemotherapy with Alimta 500 MG/M2 in addition to Avastin 15 MG/KG every 3 weeks. First dose 07/13/2017.  Status post 20 cycles.  Starting from cycle #16 he will be treated with single agent Alimta only secondary to intolerance.   CURRENT THERAPY: Second line treatment with immunotherapy with nivolumab 480 mg IV every 4 weeks.  First dose October 26, 2018.  Status post 19 cycles.  INTERVAL HISTORY: Calvin Wagner 54 y.o. male returns to the clinic today for follow-up visit.  The patient is feeling fine today with no concerning complaints.  He has no seizures since March 2021.  He is followed closely by Dr. Mickeal Skinner.  He denied having any current chest pain, shortness of breath, cough or hemoptysis.  He denied having any fever or chills.  He has no nausea, vomiting, diarrhea or constipation.  He denied having any headache  or visual changes.  He continues to tolerate his treatment with nivolumab fairly well.  The patient is here today for evaluation before starting cycle #20.   MEDICAL HISTORY: Past Medical History:  Diagnosis Date  . Encounter for antineoplastic chemotherapy 12/31/2016  . GERD 11/08/2009   Qualifier: Diagnosis of  By: Ronnald Ramp MD, Arvid Right Goals of care, counseling/discussion 12/31/2016  . History of radiation therapy 01/13/2017 - 02/02/2017   Site/dose:  Left lung: 30 Gy in 15 fractions  . HYPERTENSION 11/08/2009   Qualifier: Diagnosis of  By: Ronnald Ramp MD, Arvid Right.   . 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 Migraine 02/25/2012  . Pneumonia   . PONV (postoperative nausea and vomiting)   . Rotator cuff tear, left     ALLERGIES:  has No Known Allergies.  MEDICATIONS:  Current Outpatient Medications  Medication Sig Dispense Refill  . albuterol (PROVENTIL HFA;VENTOLIN HFA) 108 (90 Base) MCG/ACT inhaler Inhale 2 puffs into the lungs every 4 (four) hours as needed for wheezing or shortness of breath.     Marland Kitchen amLODipine (NORVASC) 5 MG tablet Take 5 mg by mouth daily.    . chlorpheniramine-HYDROcodone (TUSSIONEX) 10-8 MG/5ML SUER Take 5 mLs by mouth every 12 (twelve) hours as needed for cough. (Patient not taking: Reported on 01/02/2020) 240 mL 0  . dexamethasone (DECADRON) 1 MG tablet Take 1 tablet (1 mg total) by mouth daily with breakfast. 60 tablet 3  . famotidine (PEPCID) 20 MG tablet Take 20 mg by mouth 2 (two) times daily.    Marland Kitchen  folic acid (FOLVITE) 1 MG tablet Take 1 tablet (1 mg total) by mouth daily. (Patient not taking: Reported on 01/02/2020) 30 tablet 4  . levETIRAcetam (KEPPRA) 750 MG tablet Take 1 tablet (750 mg total) by mouth 2 (two) times daily. 60 tablet 3  . LORazepam (ATIVAN) 1 MG tablet TAKE ONE TAB BY MOUTH 30 MINUTES PRIOR TO MRI OR RADIOTHERAPY. 30 tablet 0  . losartan (COZAAR) 50 MG tablet Take 50 mg by mouth daily.    . magic mouthwash SOLN Take 5 mLs  by mouth 4 (four) times daily as needed for mouth pain. 1:1:1 mix-Nystatin. Benadryl and extra strength maalox. (Patient not taking: Reported on 01/02/2020) 240 mL 0  . Multiple Vitamin (MULTIVITAMIN WITH MINERALS) TABS tablet Take 1 tablet by mouth daily.    . ondansetron (ZOFRAN) 8 MG tablet TAKE 1 TABLET BY MOUTH EVERY 8 HOURS AS NEEDED FOR NAUSEA OR VOMITING. (Patient not taking: Reported on 01/02/2020) 90 tablet 0  . PROAIR HFA 108 (90 Base) MCG/ACT inhaler Inhale 2 puffs into the lungs every 4 (four) hours as needed.  5  . prochlorperazine (COMPAZINE) 10 MG tablet TAKE 1 TABLET BY MOUTH EVERY 6 HOURS AS NEEDED FOR NAUSEA OR VOMITING (Patient not taking: Reported on 01/02/2020) 90 tablet 0  . rivaroxaban (XARELTO) 20 MG TABS tablet Take 1 tablet (20 mg total) by mouth daily with supper. 30 tablet 2  . traMADol (ULTRAM) 50 MG tablet TAKE 1 TABLET BY MOUTH EVERY 12 HOURS AS NEEDED (Patient not taking: Reported on 01/02/2020) 30 tablet 0   No current facility-administered medications for this visit.    SURGICAL HISTORY:  Past Surgical History:  Procedure Laterality Date  . INGUINAL HERNIA REPAIR    . SHOULDER ARTHROSCOPY WITH ROTATOR CUFF REPAIR Left 11/12/2016   Procedure: SHOULDER ARTHROSCOPY WITH ROTATOR CUFF REPAIR AND SUBACROMIAL DECOMPRESSION;  Surgeon: Tania Ade, MD;  Location: [facility];  Service: Orthopedics;  Laterality: Left;  SHOULDER ARTHROSCOPY WITH ROTATOR CUFF REPAIR AND SUBACROMIAL DECOMPRESSION  . TONSILLECTOMY    . TOTAL KNEE ARTHROPLASTY    . VIDEO BRONCHOSCOPY Bilateral 12/10/2016   Procedure: VIDEO BRONCHOSCOPY WITHOUT FLUORO;  Surgeon: Tanda Rockers, MD;  Location: [facility];  Service: Cardiopulmonary;  Laterality: Bilateral;    REVIEW OF SYSTEMS:  A comprehensive review of systems was negative except for: Constitutional: positive for fatigue   PHYSICAL EXAMINATION: General appearance: alert, cooperative and no distress Head: Normocephalic, without obvious  abnormality, atraumatic Neck: no adenopathy, no JVD, supple, symmetrical, trachea midline and thyroid not enlarged, symmetric, no tenderness/mass/nodules Lymph nodes: Cervical, supraclavicular, and axillary nodes normal. Resp: clear to auscultation bilaterally Back: symmetric, no curvature. ROM normal. No CVA tenderness. Cardio: regular rate and rhythm, S1, S2 normal, no murmur, click, rub or gallop GI: soft, non-tender; bowel sounds normal; no masses,  no organomegaly Extremities: extremities normal, atraumatic, no cyanosis or edema  ECOG PERFORMANCE STATUS: 1 - Symptomatic but completely ambulatory  Blood pressure 138/89, pulse (!) 111, temperature 97.9 F (36.6 C), temperature source Temporal, resp. rate 18, height 6\' 1"  (1.854 m), weight 254 lb 4.8 oz (115.3 kg), SpO2 100 %.  LABORATORY DATA: Lab Results  Component Value Date   WBC 4.7 04/17/2020   HGB 15.8 04/17/2020   HCT 46.9 04/17/2020   MCV 105.4 (H) 04/17/2020   PLT 238 04/17/2020      Chemistry      Component Value Date/Time   NA 139 03/21/2020 0905   NA 138 10/08/2017 0907   K  4.2 03/21/2020 0905   K 4.1 10/08/2017 0907   CL 103 03/21/2020 0905   CO2 24 03/21/2020 0905   CO2 24 10/08/2017 0907   BUN 8 03/21/2020 0905   BUN 11.7 10/08/2017 0907   CREATININE 1.27 (H) 03/21/2020 0905   CREATININE 1.2 10/08/2017 0907      Component Value Date/Time   CALCIUM 9.3 03/21/2020 0905   CALCIUM 9.5 10/08/2017 0907   ALKPHOS 101 03/21/2020 0905   ALKPHOS 88 10/08/2017 0907   AST 16 03/21/2020 0905   AST 22 10/08/2017 0907   ALT 26 03/21/2020 0905   ALT 24 10/08/2017 0907   BILITOT 0.8 03/21/2020 0905   BILITOT 0.42 10/08/2017 0907       RADIOGRAPHIC STUDIES: No results found.  ASSESSMENT AND PLAN: This is a very pleasant 54 years old white male with stage IV non-small cell lung cancer, high-grade neuroendocrine carcinoma presented with locally advanced disease in the chest as well as solitary brain metastasis  and 2 liver lesions. The patient is status post stereotactic radiotherapy to the solitary brain metastasis as well as short course of concurrent chemoradiation to the locally advanced disease in the chest. He recently completed 6 cycles of systemic chemotherapy with carboplatin, Alimta and Avastin and tolerated this treatment fairly well. He was started on maintenance treatment with Alimta and Avastin status post 15 cycles.  Avastin was discontinued secondary to intolerance to the combination treatment.  He is currently on treatment with single agent Alimta status post 5 cycles. He has been tolerating his treatment well. Unfortunately he was recently found to have multiple metastatic brain lesion and the patient underwent whole brain irradiation. His scan showed evidence for disease progression with enlargement of left lower lobe lung nodule in addition to hilar lymphadenopathy as well as progression of the liver lesion. He was started on treatment with nivolumab 480 mg IV every 4 weeks status post 19 cycles.  The patient has been tolerating this treatment well with no concerning adverse effects. I recommended for him to proceed with cycle #20 today as planned. I will see him back for follow-up visit in 4 weeks for evaluation with repeat CT scan of the chest, abdomen pelvis for restaging of his disease. For the tachycardia, he was encouraged to increase his oral intake. The patient was advised to call immediately if he has any concerning symptoms in the interval. The patient voices understanding of current disease status and treatment options and is in agreement with the current care plan. All questions were answered. The patient knows to call the clinic with any problems, questions or concerns. We can certainly see the patient much sooner if necessary.  Disclaimer: This note was dictated with voice recognition software. Similar sounding words can inadvertently be transcribed and may not be corrected  upon review.

## 2020-04-17 NOTE — Progress Notes (Signed)
OK to treat pt today with Nivolumab and heart rate of 111/min.

## 2020-04-18 ENCOUNTER — Telehealth: Payer: Self-pay | Admitting: *Deleted

## 2020-04-18 ENCOUNTER — Telehealth: Payer: Self-pay | Admitting: Internal Medicine

## 2020-04-18 NOTE — Telephone Encounter (Signed)
Patients wife called to ask if it would be ok if they moved his expected MRI out to end of August instead of beginning of August.  He is due to come back to see Julien Nordmann at the beginning of August plus CT scans and infusion and felt it would be too much all at one time.  Routed to Dr. Mickeal Skinner to advise.

## 2020-04-18 NOTE — Telephone Encounter (Signed)
Per Dr. Mickeal Skinner ok to move MRI out to end of August 2021.  Wife will call back to adjust follow up appt with Dr. Mickeal Skinner to beyond that MRI date.

## 2020-04-18 NOTE — Telephone Encounter (Signed)
Scheduled appt per 7/7 los - unable to reach pt - left message with app date and time

## 2020-04-18 NOTE — Telephone Encounter (Signed)
Yes, this is ok with me  Calvin Sellers, MD

## 2020-04-26 ENCOUNTER — Telehealth: Payer: Self-pay | Admitting: Internal Medicine

## 2020-04-26 NOTE — Telephone Encounter (Signed)
Rescheduled f/u with [facility] to 9/2. Provider on pal 8/31. Unable to reach pt. Left voicemail with appt time and date.

## 2020-04-29 ENCOUNTER — Telehealth: Payer: Self-pay | Admitting: *Deleted

## 2020-04-29 MED ORDER — LEVETIRACETAM 750 MG PO TABS: 750.0000 mg | ORAL_TABLET | Freq: Two times a day (BID) | ORAL | 3 refills | Status: DC

## 2020-04-29 NOTE — Addendum Note (Signed)
Addended by: Ventura Sellers on: 04/29/2020 03:04 PM   Modules accepted: Orders

## 2020-04-29 NOTE — Telephone Encounter (Signed)
Requesting refill of Keppra to [facility].  Routed to MD to review for process.

## 2020-05-13 ENCOUNTER — Other Ambulatory Visit: Payer: Self-pay | Admitting: Radiation Therapy

## 2020-05-14 ENCOUNTER — Other Ambulatory Visit: Payer: Medicare Other

## 2020-05-14 ENCOUNTER — Ambulatory Visit: Payer: Medicare Other | Admitting: Internal Medicine

## 2020-05-14 ENCOUNTER — Other Ambulatory Visit: Payer: Self-pay

## 2020-05-14 ENCOUNTER — Ambulatory Visit: Payer: Medicare Other

## 2020-05-14 ENCOUNTER — Encounter (HOSPITAL_COMMUNITY): Payer: Self-pay

## 2020-05-14 ENCOUNTER — Ambulatory Visit (HOSPITAL_COMMUNITY)
Admission: RE | Admit: 2020-05-14 | Discharge: 2020-05-14 | Disposition: A | Discharge status: Home / Self Care | Payer: Medicare Other | Source: Ambulatory Visit | Attending: Internal Medicine | Admitting: Internal Medicine

## 2020-05-14 ENCOUNTER — Other Ambulatory Visit: Payer: Self-pay | Admitting: Physician Assistant

## 2020-05-14 DIAGNOSIS — C3490 Malignant neoplasm of unspecified part of unspecified bronchus or lung: Secondary | ICD-10-CM | POA: Diagnosis not present

## 2020-05-14 DIAGNOSIS — R5383 Other fatigue: Secondary | ICD-10-CM

## 2020-05-14 MED ORDER — IOHEXOL 300 MG/ML  SOLN
100.0000 mL | Freq: Once | INTRAMUSCULAR | Status: AC | PRN
  Administered 2020-05-14: 100 mL via INTRAVENOUS

## 2020-05-14 MED ORDER — SODIUM CHLORIDE (PF) 0.9 % IJ SOLN
INTRAMUSCULAR | Status: AC
  Filled 2020-05-14: qty 50

## 2020-05-15 ENCOUNTER — Inpatient Hospital Stay: Payer: Medicare Other | Attending: Internal Medicine

## 2020-05-15 ENCOUNTER — Inpatient Hospital Stay: Payer: Medicare Other

## 2020-05-15 ENCOUNTER — Inpatient Hospital Stay (HOSPITAL_BASED_OUTPATIENT_CLINIC_OR_DEPARTMENT_OTHER): Payer: Medicare Other | Admitting: Internal Medicine

## 2020-05-15 ENCOUNTER — Other Ambulatory Visit: Payer: Self-pay

## 2020-05-15 ENCOUNTER — Encounter: Payer: Self-pay | Admitting: Internal Medicine

## 2020-05-15 VITALS — BP 129/97 | HR 97 | Temp 97.2°F | Resp 18 | Ht 73.0 in | Wt 248.8 lb

## 2020-05-15 DIAGNOSIS — C7931 Secondary malignant neoplasm of brain: Secondary | ICD-10-CM | POA: Diagnosis not present

## 2020-05-15 DIAGNOSIS — I10 Essential (primary) hypertension: Secondary | ICD-10-CM | POA: Diagnosis not present

## 2020-05-15 DIAGNOSIS — C3492 Malignant neoplasm of unspecified part of left bronchus or lung: Secondary | ICD-10-CM

## 2020-05-15 DIAGNOSIS — Z79899 Other long term (current) drug therapy: Secondary | ICD-10-CM | POA: Diagnosis not present

## 2020-05-15 DIAGNOSIS — Z5112 Encounter for antineoplastic immunotherapy: Secondary | ICD-10-CM | POA: Insufficient documentation

## 2020-05-15 DIAGNOSIS — Z923 Personal history of irradiation: Secondary | ICD-10-CM | POA: Diagnosis not present

## 2020-05-15 DIAGNOSIS — R5383 Other fatigue: Secondary | ICD-10-CM

## 2020-05-15 DIAGNOSIS — C3432 Malignant neoplasm of lower lobe, left bronchus or lung: Secondary | ICD-10-CM | POA: Diagnosis not present

## 2020-05-15 DIAGNOSIS — C7A1 Malignant poorly differentiated neuroendocrine tumors: Secondary | ICD-10-CM | POA: Insufficient documentation

## 2020-05-15 DIAGNOSIS — C3490 Malignant neoplasm of unspecified part of unspecified bronchus or lung: Secondary | ICD-10-CM

## 2020-05-15 LAB — CMP (CANCER CENTER ONLY)
ALT: 22 U/L (ref 0–44)
AST: 16 U/L (ref 15–41)
Albumin: 3.5 g/dL (ref 3.5–5.0)
Alkaline Phosphatase: 90 U/L (ref 38–126)
Anion gap: 8 (ref 5–15)
BUN: 8 mg/dL (ref 6–20)
CO2: 24 mmol/L (ref 22–32)
Calcium: 9.6 mg/dL (ref 8.9–10.3)
Chloride: 103 mmol/L (ref 98–111)
Creatinine: 1.25 mg/dL — ABNORMAL HIGH (ref 0.61–1.24)
GFR, Est AFR Am: >60 mL/min (ref >60)
GFR, Estimated: >60 mL/min (ref >60)
Glucose, Bld: 172 mg/dL — ABNORMAL HIGH (ref 70–99)
Potassium: 3.8 mmol/L (ref 3.5–5.1)
Sodium: 135 mmol/L (ref 135–145)
Total Bilirubin: 0.4 mg/dL (ref 0.3–1.2)
Total Protein: 7 g/dL (ref 6.5–8.1)

## 2020-05-15 LAB — CBC WITH DIFFERENTIAL (CANCER CENTER ONLY)
Abs Immature Granulocytes: 0.01 10*3/uL (ref 0.00–0.07)
Basophils Absolute: 0 10*3/uL (ref 0.0–0.1)
Basophils Relative: 0 %
Eosinophils Absolute: 0.1 10*3/uL (ref 0.0–0.5)
Eosinophils Relative: 2 %
HCT: 49.8 % (ref 39.0–52.0)
Hemoglobin: 16.7 g/dL (ref 13.0–17.0)
Immature Granulocytes: 0 %
Lymphocytes Relative: 15 %
Lymphs Abs: 0.8 10*3/uL (ref 0.7–4.0)
MCH: 33.8 pg (ref 26.0–34.0)
MCHC: 33.5 g/dL (ref 30.0–36.0)
MCV: 100.8 fL — ABNORMAL HIGH (ref 80.0–100.0)
Monocytes Absolute: 0.5 10*3/uL (ref 0.1–1.0)
Monocytes Relative: 9 %
Neutro Abs: 3.9 10*3/uL (ref 1.7–7.7)
Neutrophils Relative %: 74 %
Platelet Count: 207 10*3/uL (ref 150–400)
RBC: 4.94 MIL/uL (ref 4.22–5.81)
RDW: 12.3 % (ref 11.5–15.5)
WBC Count: 5.3 10*3/uL (ref 4.0–10.5)
nRBC: 0 % (ref 0.0–0.2)

## 2020-05-15 LAB — TSH: TSH: 1.678 u[IU]/mL (ref 0.320–4.118)

## 2020-05-15 MED ORDER — SODIUM CHLORIDE 0.9 % IV SOLN
480.0000 mg | Freq: Once | INTRAVENOUS | Status: AC
  Administered 2020-05-15: 480 mg via INTRAVENOUS
  Filled 2020-05-15: qty 48

## 2020-05-15 MED ORDER — SODIUM CHLORIDE 0.9 % IV SOLN
Freq: Once | INTRAVENOUS | Status: DC
  Filled 2020-05-15: qty 250

## 2020-05-15 NOTE — Progress Notes (Signed)
[facility] [phone]   [phone]  OFFICE PROGRESS NOTE  [name], MD 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  PRIOR THERAPY:  1) Stereotactic radiotherapy to a solitary brain metastasis under the care of Dr. Lisbeth Renshaw on 01/25/2017. 2) Short course of concurrent chemoradiation with weekly carboplatin for AUC of 2 and paclitaxel 45 MG/M2 to the locally advanced disease in the chest. Status post 3 cycles. 3) Systemic chemotherapy with carboplatin for AUC of 5, Alimta 500 MG/M2 and Avastin 15 MG/KG every 3 weeks. First dose of 02/15/2017. Status post 6 cycles. Last dose 06/08/2017 with stable disease. 4) whole brain irradiation under the care of Dr. Lisbeth Renshaw expected to complete on October 14, 2018. 5) Maintenance systemic chemotherapy with Alimta 500 MG/M2 in addition to Avastin 15 MG/KG every 3 weeks. First dose 07/13/2017.  Status post 20 cycles.  Starting from cycle #16 he will be treated with single agent Alimta only secondary to intolerance.   CURRENT THERAPY: Second line treatment with immunotherapy with nivolumab 480 mg IV every 4 weeks.  First dose October 26, 2018.  Status post 20 cycles.  INTERVAL HISTORY: BONNER LARUE 54 y.o. male returns to the clinic today for follow-up visit accompanied by his wife.  The patient is feeling fine today with no concerning complaints.  He denied having any current chest pain, shortness of breath, cough or hemoptysis.  He denied having any nausea, vomiting, diarrhea or constipation.  He has no headache or visual changes.  He was stable from his previous treatment with Decadron and feeling  better.  He lost few pounds after discontinuing the steroids.  The patient denied having any fever or chills.  He has been tolerating his treatment with nivolumab fairly well.  He had repeat CT scan of the chest, abdomen pelvis performed recently and he is here for evaluation and discussion of his scan results.   MEDICAL HISTORY: Past Medical History:  Diagnosis Date  . Encounter for antineoplastic chemotherapy 12/31/2016  . GERD 11/08/2009   Qualifier: Diagnosis of  By: Ronnald Ramp MD, Arvid Right Goals of care, counseling/discussion 12/31/2016  . History of radiation therapy 01/13/2017 - 02/02/2017   Site/dose:  Left lung: 30 Gy in 15 fractions  . HYPERTENSION 11/08/2009   Qualifier: Diagnosis of  By: Ronnald Ramp MD, Arvid Right.   . Large cell carcinoma of left lung, stage 4 ([facility]) 12/31/2016  . Migraine 02/25/2012  . Pneumonia   . PONV (postoperative nausea and vomiting)   . Rotator cuff tear, left     ALLERGIES:  has No Known Allergies.  MEDICATIONS:  Current Outpatient Medications  Medication Sig Dispense Refill  . albuterol (PROVENTIL HFA;VENTOLIN HFA) 108 (90 Base) MCG/ACT inhaler Inhale 2 puffs into the lungs every 4 (four) hours as needed for wheezing or shortness of breath.     Marland Kitchen amLODipine (NORVASC) 5 MG tablet Take 5 mg by mouth daily.    . chlorpheniramine-HYDROcodone (TUSSIONEX) 10-8 MG/5ML SUER Take 5 mLs by mouth every 12 (twelve) hours as needed for cough. (Patient not taking: Reported on 01/02/2020) 240 mL 0  . famotidine (PEPCID) 20 MG tablet Take 20 mg by mouth 2 (two) times daily.    Marland Kitchen  folic acid (FOLVITE) 1 MG tablet Take 1 tablet (1 mg total) by mouth daily. (Patient not taking: Reported on 01/02/2020) 30 tablet 4  . levETIRAcetam (KEPPRA) 750 MG tablet Take 1 tablet (750 mg total) by mouth 2 (two) times daily. 60 tablet 3  . LORazepam (ATIVAN) 1 MG tablet TAKE ONE TAB BY MOUTH 30 MINUTES PRIOR TO MRI OR RADIOTHERAPY. 30 tablet 0  . losartan (COZAAR) 50 MG tablet Take 50 mg by  mouth daily.    . magic mouthwash SOLN Take 5 mLs by mouth 4 (four) times daily as needed for mouth pain. 1:1:1 mix-Nystatin. Benadryl and extra strength maalox. (Patient not taking: Reported on 01/02/2020) 240 mL 0  . Multiple Vitamin (MULTIVITAMIN WITH MINERALS) TABS tablet Take 1 tablet by mouth daily.    . ondansetron (ZOFRAN) 8 MG tablet TAKE 1 TABLET BY MOUTH EVERY 8 HOURS AS NEEDED FOR NAUSEA OR VOMITING. (Patient not taking: Reported on 01/02/2020) 90 tablet 0  . PROAIR HFA 108 (90 Base) MCG/ACT inhaler Inhale 2 puffs into the lungs every 4 (four) hours as needed.  5  . prochlorperazine (COMPAZINE) 10 MG tablet TAKE 1 TABLET BY MOUTH EVERY 6 HOURS AS NEEDED FOR NAUSEA OR VOMITING (Patient not taking: Reported on 01/02/2020) 90 tablet 0  . rivaroxaban (XARELTO) 20 MG TABS tablet Take 1 tablet (20 mg total) by mouth daily with supper. 30 tablet 2  . traMADol (ULTRAM) 50 MG tablet TAKE 1 TABLET BY MOUTH EVERY 12 HOURS AS NEEDED (Patient not taking: Reported on 01/02/2020) 30 tablet 0   No current facility-administered medications for this visit.    SURGICAL HISTORY:  Past Surgical History:  Procedure Laterality Date  . INGUINAL HERNIA REPAIR    . SHOULDER ARTHROSCOPY WITH ROTATOR CUFF REPAIR Left 11/12/2016   Procedure: SHOULDER ARTHROSCOPY WITH ROTATOR CUFF REPAIR AND SUBACROMIAL DECOMPRESSION;  Surgeon: Tania Ade, MD;  Location: [facility];  Service: Orthopedics;  Laterality: Left;  SHOULDER ARTHROSCOPY WITH ROTATOR CUFF REPAIR AND SUBACROMIAL DECOMPRESSION  . TONSILLECTOMY    . TOTAL KNEE ARTHROPLASTY    . VIDEO BRONCHOSCOPY Bilateral 12/10/2016   Procedure: VIDEO BRONCHOSCOPY WITHOUT FLUORO;  Surgeon: Tanda Rockers, MD;  Location: [facility];  Service: Cardiopulmonary;  Laterality: Bilateral;    REVIEW OF SYSTEMS:  Constitutional: positive for fatigue Eyes: negative Ears, nose, mouth, throat, and face: negative Respiratory: negative Cardiovascular: negative Gastrointestinal:  negative Genitourinary:negative Integument/breast: negative Hematologic/lymphatic: negative Musculoskeletal:negative Neurological: negative Behavioral/Psych: negative Endocrine: negative Allergic/Immunologic: negative   PHYSICAL EXAMINATION: General appearance: alert, cooperative, fatigued and no distress Head: Normocephalic, without obvious abnormality, atraumatic Neck: no adenopathy, no JVD, supple, symmetrical, trachea midline and thyroid not enlarged, symmetric, no tenderness/mass/nodules Lymph nodes: Cervical, supraclavicular, and axillary nodes normal. Resp: clear to auscultation bilaterally Back: symmetric, no curvature. ROM normal. No CVA tenderness. Cardio: regular rate and rhythm, S1, S2 normal, no murmur, click, rub or gallop GI: soft, non-tender; bowel sounds normal; no masses,  no organomegaly Extremities: extremities normal, atraumatic, no cyanosis or edema Neurologic: Alert and oriented X 3, normal strength and tone. Normal symmetric reflexes. Normal coordination and gait  ECOG PERFORMANCE STATUS: 1 - Symptomatic but completely ambulatory  Blood pressure (!) 129/97, pulse 97, temperature (!) 97.2 F (36.2 C), temperature source Temporal, resp. rate 18, height 6\' 1"  (1.854 m), weight 248 lb 12.8 oz (112.9 kg), SpO2 100 %.  LABORATORY DATA: Lab Results  Component Value Date   WBC 4.7 04/17/2020   HGB 15.8 04/17/2020   HCT 46.9 04/17/2020  MCV 105.4 (H) 04/17/2020   PLT 238 04/17/2020      Chemistry      Component Value Date/Time   NA 138 04/17/2020 0843   NA 138 10/08/2017 0907   K 3.8 04/17/2020 0843   K 4.1 10/08/2017 0907   CL 105 04/17/2020 0843   CO2 21 (L) 04/17/2020 0843   CO2 24 10/08/2017 0907   BUN 5 (L) 04/17/2020 0843   BUN 11.7 10/08/2017 0907   CREATININE 1.28 (H) 04/17/2020 0843   CREATININE 1.2 10/08/2017 0907      Component Value Date/Time   CALCIUM 9.1 04/17/2020 0843   CALCIUM 9.5 10/08/2017 0907   ALKPHOS 98 04/17/2020 0843    ALKPHOS 88 10/08/2017 0907   AST 18 04/17/2020 0843   AST 22 10/08/2017 0907   ALT 24 04/17/2020 0843   ALT 24 10/08/2017 0907   BILITOT 0.8 04/17/2020 0843   BILITOT 0.42 10/08/2017 0907       RADIOGRAPHIC STUDIES: CT Chest W Contrast  Result Date: 05/14/2020 CLINICAL DATA:  Primary Cancer Type: Lung Imaging Indication: Routine surveillance Interval therapy since last imaging? Yes Initial Cancer Diagnosis Date: 12/10/2016; Established by: Biopsy-proven Detailed Pathology: Stage IV non-small cell lung cancer, high-grade neuroendocrine carcinoma. Primary Tumor location: Left lower lobe. Metastases to liver and brain. Surgeries: No thoracic.  Inguinal hernia repair. Chemotherapy: Yes; Ongoing? Yes; Most recent administration: 04/17/2020 Immunotherapy?  Yes; Type: Nivolumab; Ongoing? Yes Radiation therapy? Yes; Date Range: 01/13/2017 - 02/02/2017; Target: Left lung. EXAM: CT CHEST, ABDOMEN, AND PELVIS WITH CONTRAST TECHNIQUE: Multidetector CT imaging of the chest, abdomen and pelvis was performed following the standard protocol during bolus administration of intravenous contrast. CONTRAST:  155mL OMNIPAQUE IOHEXOL 300 MG/ML  SOLN COMPARISON:  Most recent CT chest, abdomen and pelvis 02/21/2020. 12/17/2016 PET-CT. FINDINGS: CT CHEST FINDINGS Cardiovascular: No significant vascular findings. Normal heart size. No pericardial effusion. Mediastinum/Nodes: Interval enlargement low-density prevascular lymph nodes. For example 11 mm short axis node (image 29/2) increased from 7 mm. Adjacent 9 mm node on image 28 is barely measurable at 3 mm on prior. LEFT infrahilar mass is increased in size measuring 2.3 cm (image 38/2) increased from 1.8 cm. This infrahilar mass partially occludes the adjacent pulmonary vein. Lungs/Pleura: Adjacent to the enlarged LEFT infrahilar node/mass, there is peribronchial masslike thickening measuring 2.8 by 2.4 cm (image 44/2). There is postobstructive atelectasis of the LEFT lower  lobe. Enlarged LEFT pleural effusion. Within the RIGHT lung there is subpleural reticulation in the medial RIGHT lower lobe which appears benign post inflammatory. Musculoskeletal: No aggressive osseous lesion. CT ABDOMEN AND PELVIS FINDINGS Hepatobiliary: Subtle hypodense lesion in the RIGHT hepatic lobe measures 12 mm (image 66/2). Focal fatty infiltration along the falciform ligament noted (benign finding). Gallbladder normal. Pancreas: Pancreas is normal. No ductal dilatation. No pancreatic inflammation. Spleen: Normal spleen.  Small splenule inferior to the spleen. Adrenals/urinary tract: Interval continued enlargement of the LEFT adrenal gland measuring 2.6 x 1.7 cm compared with 2.2 x 1.6 cm. RIGHT adrenal glands normal. Kidneys, ureters and bladder normal. Stomach/Bowel: Stomach, small bowel, appendix, and cecum are normal. The colon and rectosigmoid colon are normal. Vascular/Lymphatic: Abdominal aorta is normal caliber. There is no retroperitoneal or periportal lymphadenopathy. No pelvic lymphadenopathy. Reproductive: Unremarkable Other: No peritoneal metastasis. Musculoskeletal: No aggressive osseous lesion. IMPRESSION: Chest Impression: 1. Interval enlargement necrotic prevascular lymph nodes. 2. Interval enlargement of LEFT infrahilar mass which abuts the pulmonary vein. 3. Interval enlargement LEFT lobe peribronchial mass with postobstructive atelectasis. 4. Interval enlargement  of small LEFT effusion. Abdomen / Pelvis Impression: 1. Interval enlargement of LEFT adrenal metastasis. 2. Subtle hypodense lesion in the RIGHT hepatic lobe. Consider contrast enhanced hepatic MRI for further characterization. Electronically Signed   By: Suzy Bouchard M.D.   On: 05/14/2020 11:52   CT Abdomen Pelvis W Contrast  Result Date: 05/14/2020 CLINICAL DATA:  Primary Cancer Type: Lung Imaging Indication: Routine surveillance Interval therapy since last imaging? Yes Initial Cancer Diagnosis Date: 12/10/2016;  Established by: Biopsy-proven Detailed Pathology: Stage IV non-small cell lung cancer, high-grade neuroendocrine carcinoma. Primary Tumor location: Left lower lobe. Metastases to liver and brain. Surgeries: No thoracic.  Inguinal hernia repair. Chemotherapy: Yes; Ongoing? Yes; Most recent administration: 04/17/2020 Immunotherapy?  Yes; Type: Nivolumab; Ongoing? Yes Radiation therapy? Yes; Date Range: 01/13/2017 - 02/02/2017; Target: Left lung. EXAM: CT CHEST, ABDOMEN, AND PELVIS WITH CONTRAST TECHNIQUE: Multidetector CT imaging of the chest, abdomen and pelvis was performed following the standard protocol during bolus administration of intravenous contrast. CONTRAST:  151mL OMNIPAQUE IOHEXOL 300 MG/ML  SOLN COMPARISON:  Most recent CT chest, abdomen and pelvis 02/21/2020. 12/17/2016 PET-CT. FINDINGS: CT CHEST FINDINGS Cardiovascular: No significant vascular findings. Normal heart size. No pericardial effusion. Mediastinum/Nodes: Interval enlargement low-density prevascular lymph nodes. For example 11 mm short axis node (image 29/2) increased from 7 mm. Adjacent 9 mm node on image 28 is barely measurable at 3 mm on prior. LEFT infrahilar mass is increased in size measuring 2.3 cm (image 38/2) increased from 1.8 cm. This infrahilar mass partially occludes the adjacent pulmonary vein. Lungs/Pleura: Adjacent to the enlarged LEFT infrahilar node/mass, there is peribronchial masslike thickening measuring 2.8 by 2.4 cm (image 44/2). There is postobstructive atelectasis of the LEFT lower lobe. Enlarged LEFT pleural effusion. Within the RIGHT lung there is subpleural reticulation in the medial RIGHT lower lobe which appears benign post inflammatory. Musculoskeletal: No aggressive osseous lesion. CT ABDOMEN AND PELVIS FINDINGS Hepatobiliary: Subtle hypodense lesion in the RIGHT hepatic lobe measures 12 mm (image 66/2). Focal fatty infiltration along the falciform ligament noted (benign finding). Gallbladder normal. Pancreas:  Pancreas is normal. No ductal dilatation. No pancreatic inflammation. Spleen: Normal spleen.  Small splenule inferior to the spleen. Adrenals/urinary tract: Interval continued enlargement of the LEFT adrenal gland measuring 2.6 x 1.7 cm compared with 2.2 x 1.6 cm. RIGHT adrenal glands normal. Kidneys, ureters and bladder normal. Stomach/Bowel: Stomach, small bowel, appendix, and cecum are normal. The colon and rectosigmoid colon are normal. Vascular/Lymphatic: Abdominal aorta is normal caliber. There is no retroperitoneal or periportal lymphadenopathy. No pelvic lymphadenopathy. Reproductive: Unremarkable Other: No peritoneal metastasis. Musculoskeletal: No aggressive osseous lesion. IMPRESSION: Chest Impression: 1. Interval enlargement necrotic prevascular lymph nodes. 2. Interval enlargement of LEFT infrahilar mass which abuts the pulmonary vein. 3. Interval enlargement LEFT lobe peribronchial mass with postobstructive atelectasis. 4. Interval enlargement of small LEFT effusion. Abdomen / Pelvis Impression: 1. Interval enlargement of LEFT adrenal metastasis. 2. Subtle hypodense lesion in the RIGHT hepatic lobe. Consider contrast enhanced hepatic MRI for further characterization. Electronically Signed   By: Suzy Bouchard M.D.   On: 05/14/2020 11:52    ASSESSMENT AND PLAN: This is a very pleasant 54 years old white male with stage IV non-small cell lung cancer, high-grade neuroendocrine carcinoma presented with locally advanced disease in the chest as well as solitary brain metastasis and 2 liver lesions. The patient is status post stereotactic radiotherapy to the solitary brain metastasis as well as short course of concurrent chemoradiation to the locally advanced disease in the chest. He  recently completed 6 cycles of systemic chemotherapy with carboplatin, Alimta and Avastin and tolerated this treatment fairly well. He was started on maintenance treatment with Alimta and Avastin status post 15 cycles.   Avastin was discontinued secondary to intolerance to the combination treatment.  He is currently on treatment with single agent Alimta status post 5 cycles. He has been tolerating his treatment well. Unfortunately he was recently found to have multiple metastatic brain lesion and the patient underwent whole brain irradiation. His scan showed evidence for disease progression with enlargement of left lower lobe lung nodule in addition to hilar lymphadenopathy as well as progression of the liver lesion. He was started on treatment with nivolumab 480 mg IV every 4 weeks status post 20 cycles.  The patient has been tolerating this treatment well with no concerning adverse effects. He had repeat CT scan of the chest, abdomen pelvis performed recently.  I personally and independently reviewed the scan images and discussed the results and showed the images to the patient and his wife. His scan showed mild enlargement of mediastinal lymph nodes as well as the left hilar mass.  There was also mild increase in the left adrenal metastasis. I discussed with the patient several options for management of his condition including switching to chemotherapy versus referring him to Dr. Lisbeth Renshaw for consideration of short course of palliative radiotherapy to the enlarging left hilar and mediastinal lymph nodes and continuing his current treatment with nivolumab for now. The patient and his wife would like to proceed with the latter option with the palliative radiotherapy and continuation of the immunotherapy.  He will proceed with cycle #20 one of his immunotherapy today as planned but if he has any evidence for further disease progression on the upcoming imaging studies, we will have to change his treatment. I will see him back for follow-up visit in 4 weeks for evaluation before starting cycle #22 of his treatment. The patient was advised to call immediately if he has any other concerning symptoms in the interval. The patient  voices understanding of current disease status and treatment options and is in agreement with the current care plan. All questions were answered. The patient knows to call the clinic with any problems, questions or concerns. We can certainly see the patient much sooner if necessary.  Disclaimer: This note was dictated with voice recognition software. Similar sounding words can inadvertently be transcribed and may not be corrected upon review.

## 2020-05-15 NOTE — Patient Instructions (Signed)
[facility] Discharge Instructions for Patients Receiving Chemotherapy  Today you received the following chemotherapy agents Opdivo  To help prevent nausea and vomiting after your treatment, we encourage you to take your nausea medication as directed   If you develop nausea and vomiting that is not controlled by your nausea medication, call the clinic.   BELOW ARE SYMPTOMS THAT SHOULD BE REPORTED IMMEDIATELY:  *FEVER GREATER THAN 100.5 F  *CHILLS WITH OR WITHOUT FEVER  NAUSEA AND VOMITING THAT IS NOT CONTROLLED WITH YOUR NAUSEA MEDICATION  *UNUSUAL SHORTNESS OF BREATH  *UNUSUAL BRUISING OR BLEEDING  TENDERNESS IN MOUTH AND THROAT WITH OR WITHOUT PRESENCE OF ULCERS  *URINARY PROBLEMS  *BOWEL PROBLEMS  UNUSUAL RASH Items with * indicate a potential emergency and should be followed up as soon as possible.  Feel free to call the clinic should you have any questions or concerns. The clinic phone number is [phone].  Please show the CHEMO ALERT CARD at check-in to the Emergency Department and triage nurse.   

## 2020-05-17 ENCOUNTER — Other Ambulatory Visit: Payer: Medicare Other

## 2020-05-20 ENCOUNTER — Ambulatory Visit: Payer: Medicare Other | Admitting: Internal Medicine

## 2020-05-22 ENCOUNTER — Other Ambulatory Visit: Payer: Self-pay | Admitting: Internal Medicine

## 2020-05-22 DIAGNOSIS — I2609 Other pulmonary embolism with acute cor pulmonale: Secondary | ICD-10-CM

## 2020-05-26 ENCOUNTER — Other Ambulatory Visit: Payer: Self-pay | Admitting: Internal Medicine

## 2020-06-03 ENCOUNTER — Telehealth: Payer: Self-pay

## 2020-06-03 ENCOUNTER — Encounter: Payer: Self-pay | Admitting: Radiation Oncology

## 2020-06-03 NOTE — Telephone Encounter (Signed)
Spoke with patient in regards to telephone visit with Shona Simpson PA on 06/04/20 at 10:30am. Patient verbalized understanding of appointment date and time. Meaningful use questions were reviewed. TM

## 2020-06-04 ENCOUNTER — Ambulatory Visit
Admission: RE | Admit: 2020-06-04 | Discharge: 2020-06-04 | Disposition: A | Discharge status: Home / Self Care | Payer: Medicare Other | Source: Ambulatory Visit | Attending: Radiation Oncology | Admitting: Radiation Oncology

## 2020-06-04 ENCOUNTER — Other Ambulatory Visit: Payer: Self-pay

## 2020-06-04 DIAGNOSIS — C7931 Secondary malignant neoplasm of brain: Secondary | ICD-10-CM

## 2020-06-04 DIAGNOSIS — R569 Unspecified convulsions: Secondary | ICD-10-CM

## 2020-06-04 DIAGNOSIS — C3492 Malignant neoplasm of unspecified part of left bronchus or lung: Secondary | ICD-10-CM

## 2020-06-04 DIAGNOSIS — C3432 Malignant neoplasm of lower lobe, left bronchus or lung: Secondary | ICD-10-CM

## 2020-06-04 NOTE — Progress Notes (Signed)
Radiation Oncology         [phone] ________________________________  Outpatient ReConsultation - Conducted via telephone due to current COVID-19 concerns for limiting patient exposure  I spoke with the patient to conduct this consult visit via telephone to spare the patient unnecessary potential exposure in the healthcare setting during the current COVID-19 pandemic. The patient was notified in advance and was offered a [place] meeting to allow for face to face communication but unfortunately reported that they did not have the appropriate resources/technology to support such a visit and instead preferred to proceed with a telephone visit.  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  Interval Since Last Radiation: 3 months    02/12/20 SRS Treatment:  PTV3 Sup Vermis 66mm was treated to a dose of 20 Gy in 1 fration using 3 DCA's. PTV4 Sup Rt Parietal 11mm was treated to a dose of 20 Gy in 1 fration using 3 DCA's.  11/03/19 SRS Treatment: PTV2, left Frontal 6 mm target was treated to 20 Gy in 1 fraction  09/29/2018 - 10/14/2018: Whole Brain / 30 Gy in 10 fractions  01/13/17 - 02/02/17: Left lung: 30 Gy in 15 fractions  01/25/17 SRS Treatment: PTV1Left Frontal 45mm: 20 Gy in 1 fraction   NARRATIVE: Calvin Wagner is a pleasant. 54 y.o.  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 He was treated with [facility] for brain disease, as well as palliative lung radiotherapy in 2018. He did develop progressive disease in the brain and received whole brain radiation. 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 He had salvage SRS to two sites in May of this year. He remains on immunotherapy with Calvin Wagner with opdivo. He had recent restaging CT scans on 05/14/20 that showed an increase in the size of his left infrahilar and mediastinal nodes in comparison to his scans in May 2021. The measurements currently are 11 mm in a prevascular node, previously 7 mm, and a 9 mm adjacent node, previously about 3 mm. The left infrahilar mass is 2.3 cm, previously 1.8 cm. Adjacent to the infrahilar node/mass, there is masslike thickening measuring 2.8 x 2.4 cm with post obstructive atelectasis in the LLL. Some postinflammatory changes are noted in the Wagner lower lobe. He also had interval enlargement of the left adrenal metastasis measuring 2.6 x 1.7 cm (previously 2.2 x 1.6 cm), and a subtle hypodense lesion in the Wagner hepatic lobe measuring 12 mm. He's contacted today to discuss options of palliative radiotherapy, specifically to the chest.  On review of systems, the patient reports that he is doing pretty well. He and his wife verbalize he has had more dizzy spells lately, mostly in the morning time. He can have nausea that accompanies these episodes but they come and go. He felt like was about have a seizure with the most memorable episode where he felt like he was almost paralyzed for a few seconds, but his symptoms resolved prior to any seizure.  He denies any chest pain, shortness of breath, cough, fevers are noted. No headaches are noted.    PAST MEDICAL HISTORY:  Past Medical History:  Diagnosis Date  . Encounter for antineoplastic chemotherapy 12/31/2016  . GERD 11/08/2009   Qualifier: Diagnosis of  By: Calvin Wagner Goals of care, counseling/discussion 12/31/2016  . History of radiation therapy 01/13/2017 - 02/02/2017   Site/dose:  Left lung: 30 Gy in 15 fractions  . HYPERTENSION 11/08/2009   Qualifier: Diagnosis of  By: Calvin Wagner.   . Large cell carcinoma  of left lung, stage 4 ([place]) 12/31/2016  . Migraine 02/25/2012  . Pneumonia   . PONV (postoperative nausea and vomiting)   . Rotator cuff tear, left     PAST SURGICAL HISTORY: Past Surgical History:  Procedure Laterality Date  . INGUINAL HERNIA REPAIR    . SHOULDER ARTHROSCOPY WITH ROTATOR CUFF REPAIR Left 11/12/2016   Procedure: SHOULDER ARTHROSCOPY WITH ROTATOR CUFF REPAIR AND SUBACROMIAL DECOMPRESSION;  Surgeon: Calvin Wagner;  Location: [facility];  Service: Orthopedics;  Laterality: Left;  SHOULDER ARTHROSCOPY WITH ROTATOR CUFF REPAIR AND SUBACROMIAL DECOMPRESSION  . TONSILLECTOMY    . TOTAL KNEE ARTHROPLASTY    . VIDEO BRONCHOSCOPY Bilateral 12/10/2016   Procedure: VIDEO BRONCHOSCOPY WITHOUT FLUORO;  Surgeon: Calvin Wagner;  Location: [facility];  Service: Cardiopulmonary;  Laterality: Bilateral;    PAST SOCIAL HISTORY:  Social History   Socioeconomic History  . Marital status: Married    Spouse name: Calvin Wagner  . Number of children: 2  . Years of education: Not on file  . Highest education level: Not on file  Occupational History  . Occupation: Material Therapist, sports: [employer]  Tobacco Use  . Smoking status: Never Smoker  . Smokeless tobacco: Never Used  Vaping Use  . Vaping Use: Never used  Substance and Sexual Activity  . Alcohol use: Yes    Comment: social  . Drug use: No  . Sexual activity: Not Currently  Other Topics Concern  . Not on file  Social History Narrative   Lives at home wife and 2 kids   Caffeine use: none     Social Determinants of Health   Financial Resource Strain:   . Difficulty of Paying Living Expenses: Not on file  Food Insecurity:   . Worried About Charity fundraiser in the Last Year: Not on file  . Ran Out of Food in the Last Year: Not on file  Transportation Needs:   . Lack of Transportation (Medical): Not on file  . Lack of Transportation (Non-Medical): Not on file  Physical Activity:   . Days of Exercise per  Week: Not on file  . Minutes of Exercise per Session: Not on file  Stress:   . Feeling of Stress : Not on file  Social Connections:   . Frequency of Communication with Friends and Family: Not on file  . Frequency of Social Gatherings with Friends and Family: Not on file  . Attends Religious Services: Not on file  . Active Member of Clubs or Organizations: Not on file  . Attends Archivist Meetings: Not on file  . Marital Status: Not on file  Intimate Partner Violence:   . Fear of Current or Ex-Partner: Not on file  . Emotionally Abused: Not on file  . Physically Abused: Not on file  . Sexually Abused: Not on file  The patient is married and lives in [place]. He has two adult sons, one is at [employer] and the other is out of college at home and helps with  his father's care too.  PAST FAMILY HISTORY: Family History  Problem Relation Age of Onset  . Heart disease Mother 76  . Lung cancer Other        family history  . CAD Other        strong family history male and male <50  . Mental retardation Other   . Heart attack Brother 59  . Alcohol abuse Neg Hx   . Diabetes Neg Hx   . Early death Neg Hx   . Hyperlipidemia Neg Hx   . Hypertension Neg Hx   . Kidney disease Neg Hx   . Stroke Neg Hx   . Migraines Neg Hx     MEDICATIONS  Current Outpatient Medications  Medication Sig Dispense Refill  . albuterol (PROVENTIL HFA;VENTOLIN HFA) 108 (90 Base) MCG/ACT inhaler Inhale 2 puffs into the lungs every 4 (four) hours as needed for wheezing or shortness of breath.     Marland Kitchen amLODipine (NORVASC) 5 MG tablet Take 5 mg by mouth daily.    . famotidine (PEPCID) 20 MG tablet Take 20 mg by mouth 2 (two) times daily.    Marland Kitchen levETIRAcetam (KEPPRA) 750 MG tablet TAKE 1 TABLET(750 MG) BY MOUTH TWICE DAILY 60 tablet 3  . LORazepam (ATIVAN) 1 MG tablet TAKE ONE TAB BY MOUTH 30 MINUTES PRIOR TO MRI OR RADIOTHERAPY. 30 tablet 0  . losartan (COZAAR) 50 MG tablet Take 50 mg by mouth daily.    .  Multiple Vitamin (MULTIVITAMIN WITH MINERALS) TABS tablet Take 1 tablet by mouth daily.    Marland Kitchen PROAIR HFA 108 (90 Base) MCG/ACT inhaler Inhale 2 puffs into the lungs every 4 (four) hours as needed.  5  . XARELTO 20 MG TABS tablet TAKE 1 TABLET(20 MG) BY MOUTH DAILY WITH SUPPER 30 tablet 2  . chlorpheniramine-HYDROcodone (TUSSIONEX) 10-8 MG/5ML SUER Take 5 mLs by mouth every 12 (twelve) hours as needed for cough. (Patient not taking: Reported on 5/39/7673) 419 mL 0  . folic acid (FOLVITE) 1 MG tablet Take 1 tablet (1 mg total) by mouth daily. (Patient not taking: Reported on 01/02/2020) 30 tablet 4  . magic mouthwash SOLN Take 5 mLs by mouth 4 (four) times daily as needed for mouth pain. 1:1:1 mix-Nystatin. Benadryl and extra strength maalox. (Patient not taking: Reported on 01/02/2020) 240 mL 0  . ondansetron (ZOFRAN) 8 MG tablet TAKE 1 TABLET BY MOUTH EVERY 8 HOURS AS NEEDED FOR NAUSEA OR VOMITING. (Patient not taking: Reported on 01/02/2020) 90 tablet 0  . prochlorperazine (COMPAZINE) 10 MG tablet TAKE 1 TABLET BY MOUTH EVERY 6 HOURS AS NEEDED FOR NAUSEA OR VOMITING (Patient not taking: Reported on 01/02/2020) 90 tablet 0  . traMADol (ULTRAM) 50 MG tablet TAKE 1 TABLET BY MOUTH EVERY 12 HOURS AS NEEDED (Patient not taking: Reported on 01/02/2020) 30 tablet 0   No current facility-administered medications for this encounter.    ALLERGIES: No Known Allergies  PHYSICAL EXAM: Unable to assess due to encounter type.  Impression/Plan: 1.  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 Dr. Lisbeth Renshaw recent imaging findings to suggest progressive nodal disease in the chest including the left hilar and mediastinal nodes. He recommends a palliative course of radiotherapy to the chest including the left lung, prevascular/hilar/mediastinal nodes.  We discussed the risks, benefits, short, and  long term effects of radiotherapy, and the patient is interested in proceeding. Dr. Lisbeth Renshaw discusses  the delivery and logistics of radiotherapy and anticipates a course of 3 weeks of radiotherapy with ongoing immunotherapy. He will come in on Thursday morning for simulation at which time he will sign written consent to proceed. 2. Seizure history. The patient will continue to be followed in the brain oncology program regularly for imaging review and with Calvin Wagner. He continues Keppra as instructed by Calvin Wagner as well. 3. Dizziness and history of radiation necrosis. The patient has had difficulties with morning dizziness and has had several dizzy episodes. We will notify Calvin Wagner and he will have an MRI tomorrow for routine surveillance. We will review his case in conference on Monday as well.   4. 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 Dr. Lisbeth Renshaw favors following these two sites at this time given plans for ongoing systemic therapy.   Given current concerns for patient exposure during the COVID-19 pandemic, this encounter was conducted via telephone.  The patient has provided two factor identification and has given verbal consent for this type of encounter and has been advised to only accept a meeting of this type in a secure network environment. The time spent during this encounter was 45 minutes including preparation, discussion, and coordination of the patient's care. The attendants for this meeting include Dr. Lisbeth Renshaw, Hayden Pedro  and Haskel Khan and his wife Calvin Wagner. During the encounter Dr. Lisbeth Renshaw, and Hayden Pedro were located at [facility] Radiation Oncology Department.  Haskel Khan and his wife Calvin Wagner were located at home.      Carola Rhine, PAC

## 2020-06-05 ENCOUNTER — Ambulatory Visit
Admission: RE | Admit: 2020-06-05 | Discharge: 2020-06-05 | Disposition: A | Discharge status: Home / Self Care | Payer: Medicare Other | Source: Ambulatory Visit | Attending: Internal Medicine | Admitting: Internal Medicine

## 2020-06-05 ENCOUNTER — Other Ambulatory Visit: Payer: Self-pay

## 2020-06-05 ENCOUNTER — Ambulatory Visit: Payer: Medicare Other | Admitting: Radiation Oncology

## 2020-06-05 DIAGNOSIS — C7931 Secondary malignant neoplasm of brain: Secondary | ICD-10-CM

## 2020-06-05 MED ORDER — GADOBENATE DIMEGLUMINE 529 MG/ML IV SOLN
15.0000 mL | Freq: Once | INTRAVENOUS | Status: AC | PRN
  Administered 2020-06-05: 15 mL via INTRAVENOUS

## 2020-06-06 ENCOUNTER — Other Ambulatory Visit: Payer: Self-pay

## 2020-06-06 ENCOUNTER — Ambulatory Visit
Admission: RE | Admit: 2020-06-06 | Discharge: 2020-06-06 | Disposition: A | Discharge status: Home / Self Care | Payer: Medicare Other | Source: Ambulatory Visit | Attending: Radiation Oncology | Admitting: Radiation Oncology

## 2020-06-06 ENCOUNTER — Other Ambulatory Visit: Payer: Self-pay | Admitting: Internal Medicine

## 2020-06-06 DIAGNOSIS — C7931 Secondary malignant neoplasm of brain: Secondary | ICD-10-CM | POA: Insufficient documentation

## 2020-06-06 DIAGNOSIS — C3492 Malignant neoplasm of unspecified part of left bronchus or lung: Secondary | ICD-10-CM | POA: Diagnosis not present

## 2020-06-06 MED ORDER — DEXAMETHASONE 4 MG PO TABS
4.0000 mg | ORAL_TABLET | Freq: Two times a day (BID) | ORAL | 1 refills | Status: DC
Start: 2020-06-06 — End: 2020-06-12

## 2020-06-06 NOTE — Addendum Note (Signed)
Encounter addended by: Kyung Rudd, MD on: 06/06/2020 6:11 PM  Actions taken: Edit attestation on clinical note

## 2020-06-08 DIAGNOSIS — C7931 Secondary malignant neoplasm of brain: Secondary | ICD-10-CM | POA: Diagnosis not present

## 2020-06-10 ENCOUNTER — Ambulatory Visit
Admission: RE | Admit: 2020-06-10 | Discharge: 2020-06-10 | Disposition: A | Discharge status: Home / Self Care | Payer: Medicare Other | Source: Ambulatory Visit | Attending: Radiation Oncology | Admitting: Radiation Oncology

## 2020-06-10 ENCOUNTER — Inpatient Hospital Stay: Payer: Medicare Other

## 2020-06-10 ENCOUNTER — Other Ambulatory Visit: Payer: Self-pay

## 2020-06-10 DIAGNOSIS — C7931 Secondary malignant neoplasm of brain: Secondary | ICD-10-CM | POA: Diagnosis not present

## 2020-06-11 ENCOUNTER — Encounter: Payer: Self-pay | Admitting: Internal Medicine

## 2020-06-11 ENCOUNTER — Inpatient Hospital Stay: Payer: Medicare Other

## 2020-06-11 ENCOUNTER — Inpatient Hospital Stay (HOSPITAL_BASED_OUTPATIENT_CLINIC_OR_DEPARTMENT_OTHER): Payer: Medicare Other | Admitting: Internal Medicine

## 2020-06-11 ENCOUNTER — Ambulatory Visit
Admission: RE | Admit: 2020-06-11 | Discharge: 2020-06-11 | Disposition: A | Discharge status: Home / Self Care | Payer: Medicare Other | Source: Ambulatory Visit | Attending: Radiation Oncology | Admitting: Radiation Oncology

## 2020-06-11 ENCOUNTER — Ambulatory Visit: Payer: Medicare Other | Admitting: Internal Medicine

## 2020-06-11 ENCOUNTER — Other Ambulatory Visit: Payer: Self-pay

## 2020-06-11 VITALS — HR 90

## 2020-06-11 VITALS — BP 146/94 | HR 101 | Temp 97.8°F | Resp 18 | Ht 73.0 in | Wt 247.6 lb

## 2020-06-11 DIAGNOSIS — Z5112 Encounter for antineoplastic immunotherapy: Secondary | ICD-10-CM | POA: Diagnosis not present

## 2020-06-11 DIAGNOSIS — C3492 Malignant neoplasm of unspecified part of left bronchus or lung: Secondary | ICD-10-CM | POA: Diagnosis not present

## 2020-06-11 DIAGNOSIS — C7931 Secondary malignant neoplasm of brain: Secondary | ICD-10-CM

## 2020-06-11 DIAGNOSIS — C3490 Malignant neoplasm of unspecified part of unspecified bronchus or lung: Secondary | ICD-10-CM

## 2020-06-11 DIAGNOSIS — R569 Unspecified convulsions: Secondary | ICD-10-CM | POA: Diagnosis not present

## 2020-06-11 DIAGNOSIS — C3432 Malignant neoplasm of lower lobe, left bronchus or lung: Secondary | ICD-10-CM

## 2020-06-11 DIAGNOSIS — R5383 Other fatigue: Secondary | ICD-10-CM

## 2020-06-11 LAB — CMP (CANCER CENTER ONLY)
ALT: 30 U/L (ref 0–44)
AST: 18 U/L (ref 15–41)
Albumin: 3.4 g/dL — ABNORMAL LOW (ref 3.5–5.0)
Alkaline Phosphatase: 75 U/L (ref 38–126)
Anion gap: 9 (ref 5–15)
BUN: 11 mg/dL (ref 6–20)
CO2: 25 mmol/L (ref 22–32)
Calcium: 9.5 mg/dL (ref 8.9–10.3)
Chloride: 102 mmol/L (ref 98–111)
Creatinine: 1.07 mg/dL (ref 0.61–1.24)
GFR, Est AFR Am: >60 mL/min (ref >60)
GFR, Estimated: >60 mL/min (ref >60)
Glucose, Bld: 208 mg/dL — ABNORMAL HIGH (ref 70–99)
Potassium: 3.9 mmol/L (ref 3.5–5.1)
Sodium: 136 mmol/L (ref 135–145)
Total Bilirubin: 0.5 mg/dL (ref 0.3–1.2)
Total Protein: 6.9 g/dL (ref 6.5–8.1)

## 2020-06-11 LAB — CBC WITH DIFFERENTIAL (CANCER CENTER ONLY)
Abs Immature Granulocytes: 0.03 10*3/uL (ref 0.00–0.07)
Basophils Absolute: 0 10*3/uL (ref 0.0–0.1)
Basophils Relative: 0 %
Eosinophils Absolute: 0 10*3/uL (ref 0.0–0.5)
Eosinophils Relative: 0 %
HCT: 51.4 % (ref 39.0–52.0)
Hemoglobin: 17.2 g/dL — ABNORMAL HIGH (ref 13.0–17.0)
Immature Granulocytes: 1 %
Lymphocytes Relative: 12 %
Lymphs Abs: 0.7 10*3/uL (ref 0.7–4.0)
MCH: 32.6 pg (ref 26.0–34.0)
MCHC: 33.5 g/dL (ref 30.0–36.0)
MCV: 97.5 fL (ref 80.0–100.0)
Monocytes Absolute: 0.3 10*3/uL (ref 0.1–1.0)
Monocytes Relative: 6 %
Neutro Abs: 4.6 10*3/uL (ref 1.7–7.7)
Neutrophils Relative %: 81 %
Platelet Count: 239 10*3/uL (ref 150–400)
RBC: 5.27 MIL/uL (ref 4.22–5.81)
RDW: 12.4 % (ref 11.5–15.5)
WBC Count: 5.6 10*3/uL (ref 4.0–10.5)
nRBC: 0 % (ref 0.0–0.2)

## 2020-06-11 LAB — TSH: TSH: 0.633 u[IU]/mL (ref 0.320–4.118)

## 2020-06-11 MED ORDER — SODIUM CHLORIDE 0.9 % IV SOLN
480.0000 mg | Freq: Once | INTRAVENOUS | Status: AC
  Administered 2020-06-11: 480 mg via INTRAVENOUS
  Filled 2020-06-11: qty 48

## 2020-06-11 MED ORDER — SODIUM CHLORIDE 0.9 % IV SOLN
Freq: Once | INTRAVENOUS | Status: AC
  Filled 2020-06-11: qty 250

## 2020-06-11 NOTE — Progress Notes (Signed)
Ok to proceed with nivolumab while pt on dexamethasone

## 2020-06-11 NOTE — Progress Notes (Signed)
Radiation Oncology         [phone] ________________________________  Outpatient ReConsultation-Conducted via telephone due to current COVID-19 concerns for limiting patient exposure  I spoke with the patient to conduct this consult visit via telephone to spare the patient unnecessary potential exposure in the healthcare setting during the current COVID-19 pandemic. The patient was notified in advance and was offered a [place] meeting to allow for face to face communication but unfortunately reported that they did not have the appropriate resources/technology to support such a visit and instead preferred to proceed with a telephone consult.   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  Interval Since Last Radiation: Currently receiving radiotherapy to the chest.   06/10/20-present: Chest Target  02/12/20 SRS Treatment:  PTV3 Sup Vermis 58m was treated to a dose of 20 Gy in 1 fration using 3 DCA's. PTV4 Sup Rt Parietal 563mwas treated to a dose of 20 Gy in 1 fration using 3 DCA's.  11/03/19 SRS Treatment: PTV2, left Frontal 6 mm target was treated to 20 Gy in 1 fraction  09/29/2018 - 10/14/2018: Whole Brain / 30 Gy in 10 fractions  01/13/17 - 02/02/17: Left lung: 30 Gy in 15 fractions  01/25/17 SRS Treatment: PTV1Left Frontal 1255m20 Gy in 1 fraction   NARRATIVE: Mr. Calvin Wagner a pleasant. [age].  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 [facility], as well as palliative lung radiotherapy in 2018. He did develop progressive disease in the brain and received whole brain radiation. 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 VasMickeal Skinnere had salvage SRS to two sites in May of this year. He remains on immunotherapy with Dr. MohJulien Nordmannth opdivo. He had recent restaging CT scans on 05/14/20 that showed an increase in the size of his left infrahilar and mediastinal nodes in comparison to his scans in May 2021. The measurements currently are 11 mm in a prevascular node, previously 7 mm, and a 9 mm adjacent node, previously about 3 mm. The left infrahilar mass is 2.3 cm, previously 1.8 cm. Adjacent to the infrahilar node/mass, there is masslike thickening measuring 2.8 x 2.4 cm with post obstructive atelectasis in the LLL. Some postinflammatory changes are noted in the right lower lobe. He also had interval enlargement of the left adrenal metastasis measuring 2.6 x 1.7 cm (previously 2.2 x 1.6 cm), and a subtle hypodense lesion in the right hepatic lobe measuring 12 mm.  We met with the patient last week by phone to discuss the findings in the chest and the rationale for palliative course of radiotherapy over 3 weeks time.  He is undergone simulation and is currently receiving this regimen.  He was due for his routine MRI of the brain for surveillance in the brain oncology program and this was performed on 06/05/2020.  Initially the findings were felt to be post treatment related but  on further review and discussion of oncology conference, it appears that there are 2 separate lesions that have not been treated by stereotactic radiosurgery, and were felt to be amenable  to treatment with SRS.  The sites were a 4 mm right frontal lobe lesion as well as a 2 mm lesion in the right caudate body.  I was able to reach the patient's wife by phone as the patient was at home asleep, and we discussed the findings.  They had already been to see Dr. Julien Nordmann answered to see Dr. Mickeal Skinner today and both physicians reviewed these results with him and the plans for  stereotactic radiosurgery.  I reviewed with him that Dr. Lisbeth Renshaw had also been a part of the conversation and recommends this treatment, and we have been working with our oncology navigator to coordinate this treatment.   On review of systems, the patient's wife states that he has been doing okay, a slower taper is going to continue with Dr. 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  No new symptoms have been noted, and the patient appears to be doing well per his wife with the current treatment of his chest.     PAST MEDICAL HISTORY:  Past Medical History:  Diagnosis Date  . Encounter for antineoplastic chemotherapy 12/31/2016  . GERD 11/08/2009   Qualifier: Diagnosis of  By: Ronnald Ramp MD, Arvid Right Goals of care, counseling/discussion 12/31/2016  . History of radiation therapy 01/13/2017 - 02/02/2017   Site/dose:  Left lung: 30 Gy in 15 fractions  . HYPERTENSION 11/08/2009   Qualifier: Diagnosis of  By: Ronnald Ramp MD, Arvid Right.   . 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 Migraine 02/25/2012  . Pneumonia   . PONV (postoperative nausea and vomiting)   . Rotator cuff tear, left     PAST SURGICAL HISTORY: Past Surgical History:  Procedure Laterality Date  . INGUINAL HERNIA REPAIR    . SHOULDER ARTHROSCOPY WITH ROTATOR CUFF REPAIR Left 11/12/2016   Procedure: SHOULDER ARTHROSCOPY WITH ROTATOR CUFF REPAIR AND SUBACROMIAL DECOMPRESSION;  Surgeon: Tania Ade, MD;  Location: [facility];  Service: Orthopedics;  Laterality: Left;  SHOULDER ARTHROSCOPY WITH ROTATOR CUFF REPAIR AND SUBACROMIAL DECOMPRESSION  . TONSILLECTOMY    . TOTAL KNEE ARTHROPLASTY    . VIDEO BRONCHOSCOPY Bilateral 12/10/2016   Procedure: VIDEO BRONCHOSCOPY WITHOUT FLUORO;  Surgeon: Tanda Rockers, MD;  Location: [facility];  Service: Cardiopulmonary;  Laterality: Bilateral;    PAST SOCIAL HISTORY:  Social History   Socioeconomic History  . Marital status: Married     Spouse name: Calvin Wagner  . Number of children: 2  . Years of education: Not on file  . Highest education level: Not on file  Occupational History  . Occupation: Material Therapist, sports: [employer]  Tobacco Use  . Smoking status: Never Smoker  . Smokeless tobacco: Never Used  Vaping Use  . Vaping Use: Never used  Substance and Sexual Activity  . Alcohol use: Yes    Comment: social  . Drug use: No  . Sexual activity: Not Currently  Other Topics Concern  . Not on file  Social History Narrative   Lives at home wife and 2 kids   Caffeine use: none     Social Determinants of Health   Financial Resource Strain:   . Difficulty of Paying Living Expenses: Not on file  Food Insecurity:   . Worried About Charity fundraiser in the Last Year: Not on file  . Ran Out of Food in the Last Year: Not on file  Transportation Needs:   . Lack  of Transportation (Medical): Not on file  . Lack of Transportation (Non-Medical): Not on file  Physical Activity:   . Days of Exercise per Week: Not on file  . Minutes of Exercise per Session: Not on file  Stress:   . Feeling of Stress : Not on file  Social Connections:   . Frequency of Communication with Friends and Family: Not on file  . Frequency of Social Gatherings with Friends and Family: Not on file  . Attends Religious Services: Not on file  . Active Member of Clubs or Organizations: Not on file  . Attends Archivist Meetings: Not on file  . Marital Status: Not on file  Intimate Partner Violence:   . Fear of Current or Ex-Partner: Not on file  . Emotionally Abused: Not on file  . Physically Abused: Not on file  . Sexually Abused: Not on file  The patient is married and lives in [place]. He has two adult sons, one is at [employer] and the other is out of college at home and helps with his father's care too.  PAST FAMILY HISTORY: Family History  Problem Relation Age of Onset  . Heart disease Mother 36  . Lung cancer Other         family history  . CAD Other        strong family history male and male <50  . Mental retardation Other   . Heart attack Brother 38  . Alcohol abuse Neg Hx   . Diabetes Neg Hx   . Early death Neg Hx   . Hyperlipidemia Neg Hx   . Hypertension Neg Hx   . Kidney disease Neg Hx   . Stroke Neg Hx   . Migraines Neg Hx     MEDICATIONS  Current Outpatient Medications  Medication Sig Dispense Refill  . albuterol (PROVENTIL HFA;VENTOLIN HFA) 108 (90 Base) MCG/ACT inhaler Inhale 2 puffs into the lungs every 4 (four) hours as needed for wheezing or shortness of breath.     Marland Kitchen amLODipine (NORVASC) 5 MG tablet Take 5 mg by mouth daily.    . chlorpheniramine-HYDROcodone (TUSSIONEX) 10-8 MG/5ML SUER Take 5 mLs by mouth every 12 (twelve) hours as needed for cough. (Patient not taking: Reported on 01/02/2020) 240 mL 0  . dexamethasone (DECADRON) 4 MG tablet Take 1 tablet (4 mg total) by mouth 2 (two) times daily. 30 tablet 1  . famotidine (PEPCID) 20 MG tablet Take 20 mg by mouth 2 (two) times daily.    . folic acid (FOLVITE) 1 MG tablet Take 1 tablet (1 mg total) by mouth daily. (Patient not taking: Reported on 01/02/2020) 30 tablet 4  . levETIRAcetam (KEPPRA) 750 MG tablet TAKE 1 TABLET(750 MG) BY MOUTH TWICE DAILY 60 tablet 3  . LORazepam (ATIVAN) 1 MG tablet TAKE ONE TAB BY MOUTH 30 MINUTES PRIOR TO MRI OR RADIOTHERAPY. 30 tablet 0  . losartan (COZAAR) 50 MG tablet Take 50 mg by mouth daily.    . magic mouthwash SOLN Take 5 mLs by mouth 4 (four) times daily as needed for mouth pain. 1:1:1 mix-Nystatin. Benadryl and extra strength maalox. (Patient not taking: Reported on 01/02/2020) 240 mL 0  . Multiple Vitamin (MULTIVITAMIN WITH MINERALS) TABS tablet Take 1 tablet by mouth daily.    . ondansetron (ZOFRAN) 8 MG tablet TAKE 1 TABLET BY MOUTH EVERY 8 HOURS AS NEEDED FOR NAUSEA OR VOMITING. (Patient not taking: Reported on 01/02/2020) 90 tablet 0  . PROAIR HFA 108 (90 Base) MCG/ACT  inhaler Inhale 2  puffs into the lungs every 4 (four) hours as needed.  5  . prochlorperazine (COMPAZINE) 10 MG tablet TAKE 1 TABLET BY MOUTH EVERY 6 HOURS AS NEEDED FOR NAUSEA OR VOMITING (Patient not taking: Reported on 01/02/2020) 90 tablet 0  . traMADol (ULTRAM) 50 MG tablet TAKE 1 TABLET BY MOUTH EVERY 12 HOURS AS NEEDED (Patient not taking: Reported on 01/02/2020) 30 tablet 0  . XARELTO 20 MG TABS tablet TAKE 1 TABLET(20 MG) BY MOUTH DAILY WITH SUPPER 30 tablet 2   No current facility-administered medications for this encounter.    ALLERGIES: No Known Allergies  PHYSICAL EXAM: Unable to assess due to encounter type.  Impression/Plan: 1.  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 Lisbeth Renshaw has recommended proceeding with the current treatment to the chest, as well as stereotactic radiosurgery in 1 fraction.  The patient will come in for simulation tomorrow afternoon prior to his radiotherapy to the chest.  We reviewed the risks, benefits, short and long-term effects of radiotherapy and the patient's wife states that he is already committed to proceeding, we will review consent with him tomorrow face-to-face.  He will also continue his immunotherapy under the care of Dr.Mohamed  2. Seizure history. The patient will continue to be followed in the brain oncology program regularly for imaging review and with Dr. Mickeal Skinner. He continues Keppra as instructed by Dr. Mickeal Skinner as well. 3. Dizziness and history of radiation necrosis. The patient continues to taper dexamethasone under the direction of Dr. Mickeal Skinner and this will be followed expectantly.   4. 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 Dr. Lisbeth Renshaw favors following these two sites at this time given plans for ongoing systemic therapy.   Given current concerns for patient exposure during the COVID-19 pandemic, this encounter was  conducted via telephone.  The patient has provided two factor identification and has given verbal consent for this type of encounter and has been advised to only accept a meeting of this type in a secure network environment. The time spent during this encounter was 20 minutes including preparation, discussion, and coordination of the patient's care. The attendants for this meeting include  Hayden Pedro  and the patient's wife Nicholous Girgenti as her husband was at home asleep. During the encounter Dr. Lisbeth Renshaw, and Hayden Pedro were located at [facility] Radiation Oncology Department.  LANDAN FEDIE was at home asleep and his wife was at work.    Carola Rhine, PAC

## 2020-06-11 NOTE — Patient Instructions (Signed)
Cancer Center Discharge Instructions for Patients Receiving Chemotherapy  Today you received the following chemotherapy agents Opdivo  To help prevent nausea and vomiting after your treatment, we encourage you to take your nausea medication as directed   If you develop nausea and vomiting that is not controlled by your nausea medication, call the clinic.   BELOW ARE SYMPTOMS THAT SHOULD BE REPORTED IMMEDIATELY:  *FEVER GREATER THAN 100.5 F  *CHILLS WITH OR WITHOUT FEVER  NAUSEA AND VOMITING THAT IS NOT CONTROLLED WITH YOUR NAUSEA MEDICATION  *UNUSUAL SHORTNESS OF BREATH  *UNUSUAL BRUISING OR BLEEDING  TENDERNESS IN MOUTH AND THROAT WITH OR WITHOUT PRESENCE OF ULCERS  *URINARY PROBLEMS  *BOWEL PROBLEMS  UNUSUAL RASH Items with * indicate a potential emergency and should be followed up as soon as possible.  Feel free to call the clinic should you have any questions or concerns. The clinic phone number is [phone].  Please show the CHEMO ALERT CARD at check-in to the Emergency Department and triage nurse.   

## 2020-06-11 NOTE — Progress Notes (Signed)
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  PRIOR THERAPY:  1) Stereotactic radiotherapy to a solitary brain metastasis under the care of Dr. Lisbeth Renshaw on 01/25/2017. 2) Short course of concurrent chemoradiation with weekly carboplatin for AUC of 2 and paclitaxel 45 MG/M2 to the locally advanced disease in the chest. Status post 3 cycles. 3) Systemic chemotherapy with carboplatin for AUC of 5, Alimta 500 MG/M2 and Avastin 15 MG/KG every 3 weeks. First dose of 02/15/2017. Status post 6 cycles. Last dose 06/08/2017 with stable disease. 4) whole brain irradiation under the care of Dr. Lisbeth Renshaw expected to complete on October 14, 2018. 5) Maintenance systemic chemotherapy with Alimta 500 MG/M2 in addition to Avastin 15 MG/KG every 3 weeks. First dose 07/13/2017.  Status post 20 cycles.  Starting from cycle #16 he will be treated with single agent Alimta only secondary to intolerance. 6) short course of palliative radiotherapy to the enlarging left hilar and mediastinal lymph nodes under the care of Dr. Lisbeth Renshaw.   CURRENT THERAPY: Second line treatment with immunotherapy with nivolumab 480 mg IV every 4 weeks.  First dose October 26, 2018.  Status post 21 cycles.  INTERVAL HISTORY: Calvin Wagner 54 y.o. male returns to the clinic today for follow-up visit accompanied by his wife.  The patient is feeling fine today except for occasional nausea with the treatment with Keppra in addition to dizzy spells.  He was found to have new as well as progressive small brain lesions.  His  dose of Decadron was increased to 4 mg p.o. twice daily because of the vasogenic edema.  He denied having any current chest pain, shortness of breath, cough or hemoptysis.  He denied having any fever or chills.  He has no vomiting, diarrhea or constipation. He continues to tolerate his treatment with immunotherapy fairly well.  He has an appointment with Dr. Lisbeth Renshaw and Dr. Mickeal Skinner later today for discussion of his new brain lesions.    MEDICAL HISTORY: Past Medical History:  Diagnosis Date  . Encounter for antineoplastic chemotherapy 12/31/2016  . GERD 11/08/2009   Qualifier: Diagnosis of  By: Ronnald Ramp MD, Arvid Right Goals of care, counseling/discussion 12/31/2016  . History of radiation therapy 01/13/2017 - 02/02/2017   Site/dose:  Left lung: 30 Gy in 15 fractions  . HYPERTENSION 11/08/2009   Qualifier: Diagnosis of  By: Ronnald Ramp MD, Arvid Right.   . Large cell carcinoma of left lung, stage 4 ([facility]) 12/31/2016  . Migraine 02/25/2012  . Pneumonia   . PONV (postoperative nausea and vomiting)   . Rotator cuff tear, left     ALLERGIES:  has No Known Allergies.  MEDICATIONS:  Current Outpatient Medications  Medication Sig Dispense Refill  . albuterol (PROVENTIL HFA;VENTOLIN HFA) 108 (90 Base) MCG/ACT inhaler Inhale 2 puffs into the lungs every 4 (four) hours as needed for wheezing or shortness of breath.     Marland Kitchen amLODipine (NORVASC) 5 MG tablet Take 5 mg by mouth daily.    . chlorpheniramine-HYDROcodone (TUSSIONEX) 10-8 MG/5ML SUER Take 5 mLs by mouth every 12 (twelve) hours as needed for cough. (  Patient not taking: Reported on 01/02/2020) 240 mL 0  . dexamethasone (DECADRON) 4 MG tablet Take 1 tablet (4 mg total) by mouth 2 (two) times daily. 30 tablet 1  . famotidine (PEPCID) 20 MG tablet Take 20 mg by mouth 2 (two) times daily.    . folic acid (FOLVITE) 1 MG tablet Take 1 tablet (1 mg total) by mouth daily. (Patient not taking: Reported on 01/02/2020) 30 tablet 4  . levETIRAcetam (KEPPRA) 750 MG tablet  TAKE 1 TABLET(750 MG) BY MOUTH TWICE DAILY 60 tablet 3  . LORazepam (ATIVAN) 1 MG tablet TAKE ONE TAB BY MOUTH 30 MINUTES PRIOR TO MRI OR RADIOTHERAPY. 30 tablet 0  . losartan (COZAAR) 50 MG tablet Take 50 mg by mouth daily.    . magic mouthwash SOLN Take 5 mLs by mouth 4 (four) times daily as needed for mouth pain. 1:1:1 mix-Nystatin. Benadryl and extra strength maalox. (Patient not taking: Reported on 01/02/2020) 240 mL 0  . Multiple Vitamin (MULTIVITAMIN WITH MINERALS) TABS tablet Take 1 tablet by mouth daily.    . ondansetron (ZOFRAN) 8 MG tablet TAKE 1 TABLET BY MOUTH EVERY 8 HOURS AS NEEDED FOR NAUSEA OR VOMITING. (Patient not taking: Reported on 01/02/2020) 90 tablet 0  . PROAIR HFA 108 (90 Base) MCG/ACT inhaler Inhale 2 puffs into the lungs every 4 (four) hours as needed.  5  . prochlorperazine (COMPAZINE) 10 MG tablet TAKE 1 TABLET BY MOUTH EVERY 6 HOURS AS NEEDED FOR NAUSEA OR VOMITING (Patient not taking: Reported on 01/02/2020) 90 tablet 0  . traMADol (ULTRAM) 50 MG tablet TAKE 1 TABLET BY MOUTH EVERY 12 HOURS AS NEEDED (Patient not taking: Reported on 01/02/2020) 30 tablet 0  . XARELTO 20 MG TABS tablet TAKE 1 TABLET(20 MG) BY MOUTH DAILY WITH SUPPER 30 tablet 2   No current facility-administered medications for this visit.    SURGICAL HISTORY:  Past Surgical History:  Procedure Laterality Date  . INGUINAL HERNIA REPAIR    . SHOULDER ARTHROSCOPY WITH ROTATOR CUFF REPAIR Left 11/12/2016   Procedure: SHOULDER ARTHROSCOPY WITH ROTATOR CUFF REPAIR AND SUBACROMIAL DECOMPRESSION;  Surgeon: Tania Ade, MD;  Location: [facility];  Service: Orthopedics;  Laterality: Left;  SHOULDER ARTHROSCOPY WITH ROTATOR CUFF REPAIR AND SUBACROMIAL DECOMPRESSION  . TONSILLECTOMY    . TOTAL KNEE ARTHROPLASTY    . VIDEO BRONCHOSCOPY Bilateral 12/10/2016   Procedure: VIDEO BRONCHOSCOPY WITHOUT FLUORO;  Surgeon: Tanda Rockers, MD;  Location: [facility];  Service: Cardiopulmonary;  Laterality: Bilateral;     REVIEW OF SYSTEMS:  A comprehensive review of systems was negative except for: Constitutional: positive for fatigue Gastrointestinal: positive for nausea Neurological: positive for dizziness   PHYSICAL EXAMINATION: General appearance: alert, cooperative, fatigued and no distress Head: Normocephalic, without obvious abnormality, atraumatic Neck: no adenopathy, no JVD, supple, symmetrical, trachea midline and thyroid not enlarged, symmetric, no tenderness/mass/nodules Lymph nodes: Cervical, supraclavicular, and axillary nodes normal. Resp: clear to auscultation bilaterally Back: symmetric, no curvature. ROM normal. No CVA tenderness. Cardio: regular rate and rhythm, S1, S2 normal, no murmur, click, rub or gallop GI: soft, non-tender; bowel sounds normal; no masses,  no organomegaly Extremities: extremities normal, atraumatic, no cyanosis or edema  ECOG PERFORMANCE STATUS: 1 - Symptomatic but completely ambulatory  Blood pressure (!) 146/94, pulse (!) 101, temperature 97.8 F (36.6 C), temperature source Tympanic, resp. rate 18, height 6\' 1"  (1.854 m), weight 247 lb 9.6 oz (112.3 kg), SpO2 100 %.  LABORATORY DATA: Lab Results  Component Value Date  WBC 5.6 06/11/2020   HGB 17.2 (H) 06/11/2020   HCT 51.4 06/11/2020   MCV 97.5 06/11/2020   PLT 239 06/11/2020      Chemistry      Component Value Date/Time   NA 136 06/11/2020 0924   NA 138 10/08/2017 0907   K 3.9 06/11/2020 0924   K 4.1 10/08/2017 0907   CL 102 06/11/2020 0924   CO2 25 06/11/2020 0924   CO2 24 10/08/2017 0907   BUN 11 06/11/2020 0924   BUN 11.7 10/08/2017 0907   CREATININE 1.07 06/11/2020 0924   CREATININE 1.2 10/08/2017 0907      Component Value Date/Time   CALCIUM 9.5 06/11/2020 0924   CALCIUM 9.5 10/08/2017 0907   ALKPHOS 75 06/11/2020 0924   ALKPHOS 88 10/08/2017 0907   AST 18 06/11/2020 0924   AST 22 10/08/2017 0907   ALT 30 06/11/2020 0924   ALT 24 10/08/2017 0907   BILITOT 0.5 06/11/2020  0924   BILITOT 0.42 10/08/2017 0907       RADIOGRAPHIC STUDIES: CT Chest W Contrast  Result Date: 05/14/2020 CLINICAL DATA:  Primary Cancer Type: Lung Imaging Indication: Routine surveillance Interval therapy since last imaging? Yes Initial Cancer Diagnosis Date: 12/10/2016; Established by: Biopsy-proven Detailed Pathology: Stage IV non-small cell lung cancer, high-grade neuroendocrine carcinoma. Primary Tumor location: Left lower lobe. Metastases to liver and brain. Surgeries: No thoracic.  Inguinal hernia repair. Chemotherapy: Yes; Ongoing? Yes; Most recent administration: 04/17/2020 Immunotherapy?  Yes; Type: Nivolumab; Ongoing? Yes Radiation therapy? Yes; Date Range: 01/13/2017 - 02/02/2017; Target: Left lung. EXAM: CT CHEST, ABDOMEN, AND PELVIS WITH CONTRAST TECHNIQUE: Multidetector CT imaging of the chest, abdomen and pelvis was performed following the standard protocol during bolus administration of intravenous contrast. CONTRAST:  147mL OMNIPAQUE IOHEXOL 300 MG/ML  SOLN COMPARISON:  Most recent CT chest, abdomen and pelvis 02/21/2020. 12/17/2016 PET-CT. FINDINGS: CT CHEST FINDINGS Cardiovascular: No significant vascular findings. Normal heart size. No pericardial effusion. Mediastinum/Nodes: Interval enlargement low-density prevascular lymph nodes. For example 11 mm short axis node (image 29/2) increased from 7 mm. Adjacent 9 mm node on image 28 is barely measurable at 3 mm on prior. LEFT infrahilar mass is increased in size measuring 2.3 cm (image 38/2) increased from 1.8 cm. This infrahilar mass partially occludes the adjacent pulmonary vein. Lungs/Pleura: Adjacent to the enlarged LEFT infrahilar node/mass, there is peribronchial masslike thickening measuring 2.8 by 2.4 cm (image 44/2). There is postobstructive atelectasis of the LEFT lower lobe. Enlarged LEFT pleural effusion. Within the RIGHT lung there is subpleural reticulation in the medial RIGHT lower lobe which appears benign post  inflammatory. Musculoskeletal: No aggressive osseous lesion. CT ABDOMEN AND PELVIS FINDINGS Hepatobiliary: Subtle hypodense lesion in the RIGHT hepatic lobe measures 12 mm (image 66/2). Focal fatty infiltration along the falciform ligament noted (benign finding). Gallbladder normal. Pancreas: Pancreas is normal. No ductal dilatation. No pancreatic inflammation. Spleen: Normal spleen.  Small splenule inferior to the spleen. Adrenals/urinary tract: Interval continued enlargement of the LEFT adrenal gland measuring 2.6 x 1.7 cm compared with 2.2 x 1.6 cm. RIGHT adrenal glands normal. Kidneys, ureters and bladder normal. Stomach/Bowel: Stomach, small bowel, appendix, and cecum are normal. The colon and rectosigmoid colon are normal. Vascular/Lymphatic: Abdominal aorta is normal caliber. There is no retroperitoneal or periportal lymphadenopathy. No pelvic lymphadenopathy. Reproductive: Unremarkable Other: No peritoneal metastasis. Musculoskeletal: No aggressive osseous lesion. IMPRESSION: Chest Impression: 1. Interval enlargement necrotic prevascular lymph nodes. 2. Interval enlargement of LEFT infrahilar mass which abuts the pulmonary vein. 3.  Interval enlargement LEFT lobe peribronchial mass with postobstructive atelectasis. 4. Interval enlargement of small LEFT effusion. Abdomen / Pelvis Impression: 1. Interval enlargement of LEFT adrenal metastasis. 2. Subtle hypodense lesion in the RIGHT hepatic lobe. Consider contrast enhanced hepatic MRI for further characterization. Electronically Signed   By: Suzy Bouchard M.D.   On: 05/14/2020 11:52   MR BRAIN W WO CONTRAST  Addendum Date: 06/10/2020   ADDENDUM REPORT: 06/10/2020 08:46 ADDENDUM: Original report by Dr. Pascal Lux. Addendum by Dr. Jeralyn Ruths on 06/10/2020 after reviewing the case in multidisciplinary oncology conference. There are 2 additional enhancing lesions: 1. A 4 mm lesion in the anterior right frontal lobe which has mildly enlarged (series 14, image 106 and  series 15, image 16, previously 2 mm). 2. A new 2 mm lesion in the right caudate body (series 14, image 102). Electronically Signed   By: Logan Bores M.D.   On: 06/10/2020 08:46   Result Date: 06/10/2020 CLINICAL DATA:  Lung cancer with CNS metastases, restaging. EXAM: MRI HEAD WITHOUT AND WITH CONTRAST TECHNIQUE: Multiplanar, multiecho pulse sequences of the brain and surrounding structures were obtained without and with intravenous contrast. CONTRAST:  2mL MULTIHANCE GADOBENATE DIMEGLUMINE 529 MG/ML IV SOLN COMPARISON:  02/01/2020 FINDINGS: BRAIN New Lesions: None. Larger lesions: 11 mm now ring-enhancing lesion in the para median superior left cerebellum, 6 mm previously, seen on 14:53. Mild vasogenic edema. 2 cm lateral left frontal mass has increased by few mm to 2 cm, measured on 14:90. Vasogenic edema has increased at this level. 9 mm high right parietal cortically based mass has increased by 4 mm. New vasogenic edema is associated with this lesion. Enhancement pattern is similar to the other lesions, suggestive of central necrosis. Stable or Smaller lesions: 30 x 22 mm inferior left parasagittal frontal mass has an irregular shape which is difficult to reproducibly measure. No evidence of growth when remeasured in a similar fashion and compared slice by slice. Unchanged extensive vasogenic edema with gyral thickening. Other Brain findings: No acute infarct, acute hemorrhage, hydrocephalus, or midline shift. Vascular: Normal flow voids and vascular enhancements Skull and upper cervical spine: Negative for marrow lesion Sinuses/Orbits: Negative IMPRESSION: 4 enhancing brain masses which were all seen on prior. The 3 lesions which progressed on the prior study have again grown by few millimeters and there is new/progressive vasogenic edema. Although the largest (inferior left frontal) enhancing lesion is size stable, it incites extensive vasogenic edema. Electronically Signed: By: Monte Fantasia M.D. On:  06/06/2020 08:17   CT Abdomen Pelvis W Contrast  Result Date: 05/14/2020 CLINICAL DATA:  Primary Cancer Type: Lung Imaging Indication: Routine surveillance Interval therapy since last imaging? Yes Initial Cancer Diagnosis Date: 12/10/2016; Established by: Biopsy-proven Detailed Pathology: Stage IV non-small cell lung cancer, high-grade neuroendocrine carcinoma. Primary Tumor location: Left lower lobe. Metastases to liver and brain. Surgeries: No thoracic.  Inguinal hernia repair. Chemotherapy: Yes; Ongoing? Yes; Most recent administration: 04/17/2020 Immunotherapy?  Yes; Type: Nivolumab; Ongoing? Yes Radiation therapy? Yes; Date Range: 01/13/2017 - 02/02/2017; Target: Left lung. EXAM: CT CHEST, ABDOMEN, AND PELVIS WITH CONTRAST TECHNIQUE: Multidetector CT imaging of the chest, abdomen and pelvis was performed following the standard protocol during bolus administration of intravenous contrast. CONTRAST:  150mL OMNIPAQUE IOHEXOL 300 MG/ML  SOLN COMPARISON:  Most recent CT chest, abdomen and pelvis 02/21/2020. 12/17/2016 PET-CT. FINDINGS: CT CHEST FINDINGS Cardiovascular: No significant vascular findings. Normal heart size. No pericardial effusion. Mediastinum/Nodes: Interval enlargement low-density prevascular lymph nodes. For example 11 mm short axis node (  image 29/2) increased from 7 mm. Adjacent 9 mm node on image 28 is barely measurable at 3 mm on prior. LEFT infrahilar mass is increased in size measuring 2.3 cm (image 38/2) increased from 1.8 cm. This infrahilar mass partially occludes the adjacent pulmonary vein. Lungs/Pleura: Adjacent to the enlarged LEFT infrahilar node/mass, there is peribronchial masslike thickening measuring 2.8 by 2.4 cm (image 44/2). There is postobstructive atelectasis of the LEFT lower lobe. Enlarged LEFT pleural effusion. Within the RIGHT lung there is subpleural reticulation in the medial RIGHT lower lobe which appears benign post inflammatory. Musculoskeletal: No aggressive  osseous lesion. CT ABDOMEN AND PELVIS FINDINGS Hepatobiliary: Subtle hypodense lesion in the RIGHT hepatic lobe measures 12 mm (image 66/2). Focal fatty infiltration along the falciform ligament noted (benign finding). Gallbladder normal. Pancreas: Pancreas is normal. No ductal dilatation. No pancreatic inflammation. Spleen: Normal spleen.  Small splenule inferior to the spleen. Adrenals/urinary tract: Interval continued enlargement of the LEFT adrenal gland measuring 2.6 x 1.7 cm compared with 2.2 x 1.6 cm. RIGHT adrenal glands normal. Kidneys, ureters and bladder normal. Stomach/Bowel: Stomach, small bowel, appendix, and cecum are normal. The colon and rectosigmoid colon are normal. Vascular/Lymphatic: Abdominal aorta is normal caliber. There is no retroperitoneal or periportal lymphadenopathy. No pelvic lymphadenopathy. Reproductive: Unremarkable Other: No peritoneal metastasis. Musculoskeletal: No aggressive osseous lesion. IMPRESSION: Chest Impression: 1. Interval enlargement necrotic prevascular lymph nodes. 2. Interval enlargement of LEFT infrahilar mass which abuts the pulmonary vein. 3. Interval enlargement LEFT lobe peribronchial mass with postobstructive atelectasis. 4. Interval enlargement of small LEFT effusion. Abdomen / Pelvis Impression: 1. Interval enlargement of LEFT adrenal metastasis. 2. Subtle hypodense lesion in the RIGHT hepatic lobe. Consider contrast enhanced hepatic MRI for further characterization. Electronically Signed   By: Suzy Bouchard M.D.   On: 05/14/2020 11:52    ASSESSMENT AND PLAN: This is a very pleasant 54 years old white male with stage IV non-small cell lung cancer, high-grade neuroendocrine carcinoma presented with locally advanced disease in the chest as well as solitary brain metastasis and 2 liver lesions. The patient is status post stereotactic radiotherapy to the solitary brain metastasis as well as short course of concurrent chemoradiation to the locally advanced  disease in the chest. He recently completed 6 cycles of systemic chemotherapy with carboplatin, Alimta and Avastin and tolerated this treatment fairly well. He was started on maintenance treatment with Alimta and Avastin status post 15 cycles.  Avastin was discontinued secondary to intolerance to the combination treatment.  He is currently on treatment with single agent Alimta status post 5 cycles. He has been tolerating his treatment well. Unfortunately he was recently found to have multiple metastatic brain lesion and the patient underwent whole brain irradiation. His scan showed evidence for disease progression with enlargement of left lower lobe lung nodule in addition to hilar lymphadenopathy as well as progression of the liver lesion. He was started on treatment with nivolumab 480 mg IV every 4 weeks status post 21 cycles.  The patient continues to tolerate this treatment well with no concerning adverse effects. He is also currently undergoing short course of palliative radiotherapy to the enlarging mediastinal lymphadenopathy seen on the last scan. I recommended for him to proceed with cycle #22 today as planned. The patient had MRI of the brain that showed some evidence for disease progression.  He is followed by Dr. Mickeal Skinner and Dr. Lisbeth Renshaw.  The increase his dose of Decadron to 4 mg p.o. twice daily and hopefully this will be tapered slowly  in the next few weeks. I will see him back for follow-up visit in 4 weeks for evaluation before the next cycle of his treatment. He was advised to call immediately if he has any concerning symptoms in the interval. The patient voices understanding of current disease status and treatment options and is in agreement with the current care plan. All questions were answered. The patient knows to call the clinic with any problems, questions or concerns. We can certainly see the patient much sooner if necessary.  Disclaimer: This note was dictated with voice  recognition software. Similar sounding words can inadvertently be transcribed and may not be corrected upon review.

## 2020-06-12 ENCOUNTER — Ambulatory Visit
Admission: RE | Admit: 2020-06-12 | Discharge: 2020-06-12 | Disposition: A | Discharge status: Home / Self Care | Payer: Medicare Other | Source: Ambulatory Visit | Attending: Radiation Oncology | Admitting: Radiation Oncology

## 2020-06-12 ENCOUNTER — Other Ambulatory Visit: Payer: Self-pay

## 2020-06-12 ENCOUNTER — Ambulatory Visit: Admission: RE | Admit: 2020-06-12 | Payer: Medicare Other | Source: Ambulatory Visit | Admitting: Radiation Oncology

## 2020-06-12 ENCOUNTER — Telehealth: Payer: Self-pay | Admitting: Internal Medicine

## 2020-06-12 VITALS — BP 130/96 | HR 86 | Temp 98.3°F | Resp 18 | Ht 73.0 in | Wt 247.2 lb

## 2020-06-12 DIAGNOSIS — C7931 Secondary malignant neoplasm of brain: Secondary | ICD-10-CM | POA: Insufficient documentation

## 2020-06-12 DIAGNOSIS — C3492 Malignant neoplasm of unspecified part of left bronchus or lung: Secondary | ICD-10-CM | POA: Insufficient documentation

## 2020-06-12 MED ORDER — DEXAMETHASONE 2 MG PO TABS
2.0000 mg | ORAL_TABLET | Freq: Every day | ORAL | Status: DC
Start: 2020-06-12 — End: 2020-10-14

## 2020-06-12 MED ORDER — SODIUM CHLORIDE 0.9% FLUSH
10.0000 mL | Freq: Once | INTRAVENOUS | Status: AC
  Administered 2020-06-12: 10 mL via INTRAVENOUS

## 2020-06-12 NOTE — Progress Notes (Signed)
[facility] [place], [place] [address] [phone]   Interval Evaluation  Date of Service: 06/12/20 Patient Name: Calvin Wagner Patient MRN: [id number] Patient DOB: 1966/04/13 Provider: Ventura Sellers, MD  Identifying Statement:  Calvin Wagner is a 54 y.o. male with Brain metastasis ([facility]) [C79.31]   Primary Cancer:  Oncologic History: Oncology History Overview Note  Patient presented with onset of cough then followed with scans.       Cancer ([facility]) (Resolved)  11/25/2016 Initial Diagnosis   Cancer ([facility])   11/25/2016 Imaging   CTA IMPRESSION: 1. Multiple bilateral acute pulmonary emboli, segmental to subsegmental size. 2. 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 There is complete obstruction of the lingular and left lower lobe airways. 1 cm pulmonary nodule in the left lateral costophrenic sulcus.   11/26/2016 Imaging   CT Abdomen No mass or adenopathy in the abdomen or pelvis   12/10/2016 Surgery   Operation: Video Flexible fiberoptic bronchoscopy, diagnostic    12/17/2016 Imaging   PET IMPRESSION: 1. Extensive left hilar and mediastinal all hypermetabolic lymphadenopathy, compatible with underlying malignancy. These findings could reflect either primary lymphoma or bronchogenic carcinoma such as a small cell carcinoma. 2. There is also a small left lower lobe pulmonary nodule measuring 11 mm which is mildly hypermetabolic. This could potentially represent the primary bronchogenic neoplasm, but it is un usual for a small nodule of this size to result in such extensive lymphadenopathy.   01/02/2017 Imaging   MRI Brain IMPRESSION:  Positive for a solitary 13 mm rim enhancing metastasis in the left anterior inferior frontal gyrus (series 10, image 72. No associated edema or mass effect.   01/04/2017 -  Radiation Therapy   SIM Chest    01/11/2017 -  Chemotherapy   The  patient had palonosetron (ALOXI) injection 0.25 mg, 0.25 mg, Intravenous,  Once, 1 of 7 cycles  CARBOplatin (PARAPLATIN) 280 mg in sodium chloride 0.9 % 250 mL chemo infusion, 280 mg (100 % of original dose 281.6 mg), Intravenous,  Once, 1 of 7 cycles Dose modification: 281.6 mg (original dose 281.6 mg, Cycle 1)  PACLitaxel (TAXOL) 102 mg in dextrose 5 % 250 mL chemo infusion ( for chemotherapy treatment.     Large cell carcinoma of left lung, stage 4 (HCC)  12/31/2016 Initial Diagnosis   Large cell carcinoma of left lung, stage 4 ([facility])   10/27/2018 -  Chemotherapy   The patient had nivolumab (OPDIVO) 480 mg in sodium chloride 0.9 % 100 mL chemo infusion, 480 mg, Intravenous, Once, 22 of 25 cycles Administration: 480 mg (10/27/2018), 480 mg (11/24/2018), 480 mg (12/22/2018), 480 mg (01/19/2019), 480 mg (02/16/2019), 480 mg (03/16/2019), 480 mg (04/10/2019), 480 mg (05/11/2019), 480 mg (06/08/2019), 480 mg (07/05/2019), 480 mg (08/02/2019), 480 mg (08/30/2019), 480 mg (09/29/2019), 480 mg (10/25/2019), 480 mg (11/22/2019), 480 mg (12/20/2019), 480 mg (01/25/2020), 480 mg (02/22/2020), 480 mg (03/21/2020), 480 mg (04/17/2020), 480 mg (05/15/2020), 480 mg (06/11/2020)  for chemotherapy treatment.     CNS Oncologic History 01/25/17: SRS Treatment PTV1Left Frontal 44mm: 20 Gy in 1 fraction 10/14/18: Whole Brain / 30 Gy in 10 fractions 11/03/19: SRS L temporal 20Gy ([place]) 02/16/20: SRS to R parietal and cerebellar vermis targets  Interval History:  [name] presents to clinic for evaluation following MRI brain.  He does describe several episodes of sporadic dizziness which he feels are similar to "warnings" for seizures in the past.  No frank seizure events, however.  Started on the decadron and he feels improved overall.  Continues on Nivolumab with Dr. Julien Nordmann, although with recent progression in the chest he is undergoing additional radiation there. He remains functionally independent and intact otherwise.  No  issues with gait or cognition.  Currently taking decadron 4mg  twice per day for past 4 days.   Medications: Current Outpatient Medications on File Prior to Visit  Medication Sig Dispense Refill  . albuterol (PROVENTIL HFA;VENTOLIN HFA) 108 (90 Base) MCG/ACT inhaler Inhale 2 puffs into the lungs every 4 (four) hours as needed for wheezing or shortness of breath.     Marland Kitchen amLODipine (NORVASC) 5 MG tablet Take 5 mg by mouth daily.    Marland Kitchen dexamethasone (DECADRON) 4 MG tablet Take 1 tablet (4 mg total) by mouth 2 (two) times daily. 30 tablet 1  . famotidine (PEPCID) 20 MG tablet Take 20 mg by mouth 2 (two) times daily.    Marland Kitchen levETIRAcetam (KEPPRA) 750 MG tablet TAKE 1 TABLET(750 MG) BY MOUTH TWICE DAILY 60 tablet 3  . LORazepam (ATIVAN) 1 MG tablet TAKE ONE TAB BY MOUTH 30 MINUTES PRIOR TO MRI OR RADIOTHERAPY. 30 tablet 0  . losartan (COZAAR) 50 MG tablet Take 50 mg by mouth daily.    . Multiple Vitamin (MULTIVITAMIN WITH MINERALS) TABS tablet Take 1 tablet by mouth daily.    Marland Kitchen PROAIR HFA 108 (90 Base) MCG/ACT inhaler Inhale 2 puffs into the lungs every 4 (four) hours as needed.  5  . XARELTO 20 MG TABS tablet TAKE 1 TABLET(20 MG) BY MOUTH DAILY WITH SUPPER 30 tablet 2  . chlorpheniramine-HYDROcodone (TUSSIONEX) 10-8 MG/5ML SUER Take 5 mLs by mouth every 12 (twelve) hours as needed for cough. (Patient not taking: Reported on 9/62/2297) 989 mL 0  . folic acid (FOLVITE) 1 MG tablet Take 1 tablet (1 mg total) by mouth daily. (Patient not taking: Reported on 01/02/2020) 30 tablet 4  . magic mouthwash SOLN Take 5 mLs by mouth 4 (four) times daily as needed for mouth pain. 1:1:1 mix-Nystatin. Benadryl and extra strength maalox. (Patient not taking: Reported on 01/02/2020) 240 mL 0  . ondansetron (ZOFRAN) 8 MG tablet TAKE 1 TABLET BY MOUTH EVERY 8 HOURS AS NEEDED FOR NAUSEA OR VOMITING. (Patient not taking: Reported on 01/02/2020) 90 tablet 0  . prochlorperazine (COMPAZINE) 10 MG tablet TAKE 1 TABLET BY MOUTH EVERY 6  HOURS AS NEEDED FOR NAUSEA OR VOMITING (Patient not taking: Reported on 01/02/2020) 90 tablet 0  . traMADol (ULTRAM) 50 MG tablet TAKE 1 TABLET BY MOUTH EVERY 12 HOURS AS NEEDED (Patient not taking: Reported on 01/02/2020) 30 tablet 0   No current facility-administered medications on file prior to visit.    Allergies: No Known Allergies Past Medical History:  Past Medical History:  Diagnosis Date  . Encounter for antineoplastic chemotherapy 12/31/2016  . GERD 11/08/2009   Qualifier: Diagnosis of  By: Ronnald Ramp MD, Arvid Right Goals of care, counseling/discussion 12/31/2016  . History of radiation therapy 01/13/2017 - 02/02/2017   Site/dose:  Left lung: 30 Gy in 15 fractions  . HYPERTENSION 11/08/2009   Qualifier: Diagnosis of  By: Ronnald Ramp MD, Arvid Right.   . Large cell carcinoma of left lung, stage 4 ([facility]) 12/31/2016  . Migraine 02/25/2012  . Pneumonia   . PONV (postoperative nausea and vomiting)   . Rotator cuff tear, left    Past Surgical History:  Past Surgical History:  Procedure Laterality Date  .  INGUINAL HERNIA REPAIR    . SHOULDER ARTHROSCOPY WITH ROTATOR CUFF REPAIR Left 11/12/2016   Procedure: SHOULDER ARTHROSCOPY WITH ROTATOR CUFF REPAIR AND SUBACROMIAL DECOMPRESSION;  Surgeon: Tania Ade, MD;  Location: [facility];  Service: Orthopedics;  Laterality: Left;  SHOULDER ARTHROSCOPY WITH ROTATOR CUFF REPAIR AND SUBACROMIAL DECOMPRESSION  . TONSILLECTOMY    . TOTAL KNEE ARTHROPLASTY    . VIDEO BRONCHOSCOPY Bilateral 12/10/2016   Procedure: VIDEO BRONCHOSCOPY WITHOUT FLUORO;  Surgeon: Tanda Rockers, MD;  Location: [facility];  Service: Cardiopulmonary;  Laterality: Bilateral;   Social History:  Social History   Socioeconomic History  . Marital status: Married    Spouse name: Baker Janus  . Number of children: 2  . Years of education: Not on file  . Highest education level: Not on file  Occupational History  . Occupation: Material Therapist, sports: [employer]  Tobacco Use  .  Smoking status: Never Smoker  . Smokeless tobacco: Never Used  Vaping Use  . Vaping Use: Never used  Substance and Sexual Activity  . Alcohol use: Yes    Comment: social  . Drug use: No  . Sexual activity: Not Currently  Other Topics Concern  . Not on file  Social History Narrative   Lives at home wife and 2 kids   Caffeine use: none     Social Determinants of Health   Financial Resource Strain:   . Difficulty of Paying Living Expenses: Not on file  Food Insecurity:   . Worried About Charity fundraiser in the Last Year: Not on file  . Ran Out of Food in the Last Year: Not on file  Transportation Needs:   . Lack of Transportation (Medical): Not on file  . Lack of Transportation (Non-Medical): Not on file  Physical Activity:   . Days of Exercise per Week: Not on file  . Minutes of Exercise per Session: Not on file  Stress:   . Feeling of Stress : Not on file  Social Connections:   . Frequency of Communication with Friends and Family: Not on file  . Frequency of Social Gatherings with Friends and Family: Not on file  . Attends Religious Services: Not on file  . Active Member of Clubs or Organizations: Not on file  . Attends Archivist Meetings: Not on file  . Marital Status: Not on file  Intimate Partner Violence:   . Fear of Current or Ex-Partner: Not on file  . Emotionally Abused: Not on file  . Physically Abused: Not on file  . Sexually Abused: Not on file   Family History:  Family History  Problem Relation Age of Onset  . Heart disease Mother 19  . Lung cancer Other        family history  . CAD Other        strong family history male and male <50  . Mental retardation Other   . Heart attack Brother 82  . Alcohol abuse Neg Hx   . Diabetes Neg Hx   . Early death Neg Hx   . Hyperlipidemia Neg Hx   . Hypertension Neg Hx   . Kidney disease Neg Hx   . Stroke Neg Hx   . Migraines Neg Hx     Review of Systems: Constitutional: Denies fevers, chills  or abnormal weight loss Eyes: Denies blurriness of vision Ears, nose, mouth, throat, and face: Denies mucositis or sore throat Respiratory: Denies cough, dyspnea or wheezes Cardiovascular: Denies palpitation, chest  discomfort or lower extremity swelling Gastrointestinal:  Denies nausea, constipation, diarrhea GU: Denies dysuria or incontinence Skin: Denies abnormal skin rashes Neurological: Per HPI Musculoskeletal: Denies joint pain, back or neck discomfort. No decrease in ROM Behavioral/Psych: Denies anxiety, disturbance in thought content, and mood instability   Physical Exam: There were no vitals filed for this visit.  06/11/20  9:45 AM 05/15/20  12:12 PM    BP -- 146/94Abnormal 129/97Abnormal  Pulse Rate -- 101Abnormal 97  Resp -- 18 18  Temp -- 97.8 F (36.6 C) 97.2 F (36.2 C)Abnormal  Temp Source -- Tympanic Temporal  SpO2 -- 100 % 100 %  Weight -- 247 lb 9.6 oz (112.3 kg) 248 lb 12.8 oz (112.9 kg)  Height -- 6\' 1"  (1.854 m) 6\' 1"  (1.854 m)  Pain Score 0 0 0    KPS: 90. General: Alert, cooperative, pleasant, in no acute distress Head: Normal EENT: No conjunctival injection or scleral icterus. Oral mucosa moist Lungs: Resp effort normal Cardiac: Regular rate and rhythm Abdomen: Soft, non-distended abdomen Skin: No rashes cyanosis or petechiae. Extremities: No clubbing or edema  Neurologic Exam: Mental Status: Awake, alert, attentive to examiner. Oriented to self and environment. Language is fluent with intact comprehension.  Cranial Nerves: Visual acuity is grossly normal. Visual fields are full. Extra-ocular movements intact. No ptosis. Face is symmetric, tongue midline. Motor: Tone and bulk are normal. Power is full in both arms and legs. Reflexes are symmetric, no pathologic reflexes present. Intact finger to nose bilaterally Sensory: Intact to light touch and temperature Gait: Normal    Labs: I have reviewed the data as listed    Component Value  Date/Time   NA 136 06/11/2020 0924   NA 138 10/08/2017 0907   K 3.9 06/11/2020 0924   K 4.1 10/08/2017 0907   CL 102 06/11/2020 0924   CO2 25 06/11/2020 0924   CO2 24 10/08/2017 0907   GLUCOSE 208 (H) 06/11/2020 0924   GLUCOSE 138 10/08/2017 0907   BUN 11 06/11/2020 0924   BUN 11.7 10/08/2017 0907   CREATININE 1.07 06/11/2020 0924   CREATININE 1.2 10/08/2017 0907   CALCIUM 9.5 06/11/2020 0924   CALCIUM 9.5 10/08/2017 0907   PROT 6.9 06/11/2020 0924   PROT 7.5 10/08/2017 0907   ALBUMIN 3.4 (L) 06/11/2020 0924   ALBUMIN 3.6 10/08/2017 0907   AST 18 06/11/2020 0924   AST 22 10/08/2017 0907   ALT 30 06/11/2020 0924   ALT 24 10/08/2017 0907   ALKPHOS 75 06/11/2020 0924   ALKPHOS 88 10/08/2017 0907   BILITOT 0.5 06/11/2020 0924   BILITOT 0.42 10/08/2017 0907   GFRNONAA >60 06/11/2020 0924   GFRAA >60 06/11/2020 0924   Lab Results  Component Value Date   WBC 5.6 06/11/2020   NEUTROABS 4.6 06/11/2020   HGB 17.2 (H) 06/11/2020   HCT 51.4 06/11/2020   MCV 97.5 06/11/2020   PLT 239 06/11/2020   Imaging:  [facility] Clinician Interpretation: I have personally reviewed the CNS images as listed.  My interpretation, in the context of the patient's clinical presentation, is progressive disease  CT Chest W Contrast  Result Date: 05/14/2020 CLINICAL DATA:  Primary Cancer Type: Lung Imaging Indication: Routine surveillance Interval therapy since last imaging? Yes Initial Cancer Diagnosis Date: 12/10/2016; Established by: Biopsy-proven Detailed Pathology: Stage IV non-small cell lung cancer, high-grade neuroendocrine carcinoma. Primary Tumor location: Left lower lobe. Metastases to liver and brain. Surgeries: No thoracic.  Inguinal hernia repair. Chemotherapy: Yes; Ongoing? Yes; Most recent  administration: 04/17/2020 Immunotherapy?  Yes; Type: Nivolumab; Ongoing? Yes Radiation therapy? Yes; Date Range: 01/13/2017 - 02/02/2017; Target: Left lung. EXAM: CT CHEST, ABDOMEN, AND PELVIS WITH CONTRAST  TECHNIQUE: Multidetector CT imaging of the chest, abdomen and pelvis was performed following the standard protocol during bolus administration of intravenous contrast. CONTRAST:  133mL OMNIPAQUE IOHEXOL 300 MG/ML  SOLN COMPARISON:  Most recent CT chest, abdomen and pelvis 02/21/2020. 12/17/2016 PET-CT. FINDINGS: CT CHEST FINDINGS Cardiovascular: No significant vascular findings. Normal heart size. No pericardial effusion. Mediastinum/Nodes: Interval enlargement low-density prevascular lymph nodes. For example 11 mm short axis node (image 29/2) increased from 7 mm. Adjacent 9 mm node on image 28 is barely measurable at 3 mm on prior. LEFT infrahilar mass is increased in size measuring 2.3 cm (image 38/2) increased from 1.8 cm. This infrahilar mass partially occludes the adjacent pulmonary vein. Lungs/Pleura: Adjacent to the enlarged LEFT infrahilar node/mass, there is peribronchial masslike thickening measuring 2.8 by 2.4 cm (image 44/2). There is postobstructive atelectasis of the LEFT lower lobe. Enlarged LEFT pleural effusion. Within the RIGHT lung there is subpleural reticulation in the medial RIGHT lower lobe which appears benign post inflammatory. Musculoskeletal: No aggressive osseous lesion. CT ABDOMEN AND PELVIS FINDINGS Hepatobiliary: Subtle hypodense lesion in the RIGHT hepatic lobe measures 12 mm (image 66/2). Focal fatty infiltration along the falciform ligament noted (benign finding). Gallbladder normal. Pancreas: Pancreas is normal. No ductal dilatation. No pancreatic inflammation. Spleen: Normal spleen.  Small splenule inferior to the spleen. Adrenals/urinary tract: Interval continued enlargement of the LEFT adrenal gland measuring 2.6 x 1.7 cm compared with 2.2 x 1.6 cm. RIGHT adrenal glands normal. Kidneys, ureters and bladder normal. Stomach/Bowel: Stomach, small bowel, appendix, and cecum are normal. The colon and rectosigmoid colon are normal. Vascular/Lymphatic: Abdominal aorta is normal  caliber. There is no retroperitoneal or periportal lymphadenopathy. No pelvic lymphadenopathy. Reproductive: Unremarkable Other: No peritoneal metastasis. Musculoskeletal: No aggressive osseous lesion. IMPRESSION: Chest Impression: 1. Interval enlargement necrotic prevascular lymph nodes. 2. Interval enlargement of LEFT infrahilar mass which abuts the pulmonary vein. 3. Interval enlargement LEFT lobe peribronchial mass with postobstructive atelectasis. 4. Interval enlargement of small LEFT effusion. Abdomen / Pelvis Impression: 1. Interval enlargement of LEFT adrenal metastasis. 2. Subtle hypodense lesion in the RIGHT hepatic lobe. Consider contrast enhanced hepatic MRI for further characterization. Electronically Signed   By: Suzy Bouchard M.D.   On: 05/14/2020 11:52   MR BRAIN W WO CONTRAST  Addendum Date: 06/10/2020   ADDENDUM REPORT: 06/10/2020 08:46 ADDENDUM: Original report by Dr. Pascal Lux. Addendum by Dr. Jeralyn Ruths on 06/10/2020 after reviewing the case in multidisciplinary oncology conference. There are 2 additional enhancing lesions: 1. A 4 mm lesion in the anterior right frontal lobe which has mildly enlarged (series 14, image 106 and series 15, image 16, previously 2 mm). 2. A new 2 mm lesion in the right caudate body (series 14, image 102). Electronically Signed   By: Logan Bores M.D.   On: 06/10/2020 08:46   Result Date: 06/10/2020 CLINICAL DATA:  Lung cancer with CNS metastases, restaging. EXAM: MRI HEAD WITHOUT AND WITH CONTRAST TECHNIQUE: Multiplanar, multiecho pulse sequences of the brain and surrounding structures were obtained without and with intravenous contrast. CONTRAST:  37mL MULTIHANCE GADOBENATE DIMEGLUMINE 529 MG/ML IV SOLN COMPARISON:  02/01/2020 FINDINGS: BRAIN New Lesions: None. Larger lesions: 11 mm now ring-enhancing lesion in the para median superior left cerebellum, 6 mm previously, seen on 14:53. Mild vasogenic edema. 2 cm lateral left frontal mass has increased by few mm  to 2  cm, measured on 14:90. Vasogenic edema has increased at this level. 9 mm high right parietal cortically based mass has increased by 4 mm. New vasogenic edema is associated with this lesion. Enhancement pattern is similar to the other lesions, suggestive of central necrosis. Stable or Smaller lesions: 30 x 22 mm inferior left parasagittal frontal mass has an irregular shape which is difficult to reproducibly measure. No evidence of growth when remeasured in a similar fashion and compared slice by slice. Unchanged extensive vasogenic edema with gyral thickening. Other Brain findings: No acute infarct, acute hemorrhage, hydrocephalus, or midline shift. Vascular: Normal flow voids and vascular enhancements Skull and upper cervical spine: Negative for marrow lesion Sinuses/Orbits: Negative IMPRESSION: 4 enhancing brain masses which were all seen on prior. The 3 lesions which progressed on the prior study have again grown by few millimeters and there is new/progressive vasogenic edema. Although the largest (inferior left frontal) enhancing lesion is size stable, it incites extensive vasogenic edema. Electronically Signed: By: Monte Fantasia M.D. On: 06/06/2020 08:17   CT Abdomen Pelvis W Contrast  Result Date: 05/14/2020 CLINICAL DATA:  Primary Cancer Type: Lung Imaging Indication: Routine surveillance Interval therapy since last imaging? Yes Initial Cancer Diagnosis Date: 12/10/2016; Established by: Biopsy-proven Detailed Pathology: Stage IV non-small cell lung cancer, high-grade neuroendocrine carcinoma. Primary Tumor location: Left lower lobe. Metastases to liver and brain. Surgeries: No thoracic.  Inguinal hernia repair. Chemotherapy: Yes; Ongoing? Yes; Most recent administration: 04/17/2020 Immunotherapy?  Yes; Type: Nivolumab; Ongoing? Yes Radiation therapy? Yes; Date Range: 01/13/2017 - 02/02/2017; Target: Left lung. EXAM: CT CHEST, ABDOMEN, AND PELVIS WITH CONTRAST TECHNIQUE: Multidetector CT imaging of the  chest, abdomen and pelvis was performed following the standard protocol during bolus administration of intravenous contrast. CONTRAST:  125mL OMNIPAQUE IOHEXOL 300 MG/ML  SOLN COMPARISON:  Most recent CT chest, abdomen and pelvis 02/21/2020. 12/17/2016 PET-CT. FINDINGS: CT CHEST FINDINGS Cardiovascular: No significant vascular findings. Normal heart size. No pericardial effusion. Mediastinum/Nodes: Interval enlargement low-density prevascular lymph nodes. For example 11 mm short axis node (image 29/2) increased from 7 mm. Adjacent 9 mm node on image 28 is barely measurable at 3 mm on prior. LEFT infrahilar mass is increased in size measuring 2.3 cm (image 38/2) increased from 1.8 cm. This infrahilar mass partially occludes the adjacent pulmonary vein. Lungs/Pleura: Adjacent to the enlarged LEFT infrahilar node/mass, there is peribronchial masslike thickening measuring 2.8 by 2.4 cm (image 44/2). There is postobstructive atelectasis of the LEFT lower lobe. Enlarged LEFT pleural effusion. Within the RIGHT lung there is subpleural reticulation in the medial RIGHT lower lobe which appears benign post inflammatory. Musculoskeletal: No aggressive osseous lesion. CT ABDOMEN AND PELVIS FINDINGS Hepatobiliary: Subtle hypodense lesion in the RIGHT hepatic lobe measures 12 mm (image 66/2). Focal fatty infiltration along the falciform ligament noted (benign finding). Gallbladder normal. Pancreas: Pancreas is normal. No ductal dilatation. No pancreatic inflammation. Spleen: Normal spleen.  Small splenule inferior to the spleen. Adrenals/urinary tract: Interval continued enlargement of the LEFT adrenal gland measuring 2.6 x 1.7 cm compared with 2.2 x 1.6 cm. RIGHT adrenal glands normal. Kidneys, ureters and bladder normal. Stomach/Bowel: Stomach, small bowel, appendix, and cecum are normal. The colon and rectosigmoid colon are normal. Vascular/Lymphatic: Abdominal aorta is normal caliber. There is no retroperitoneal or periportal  lymphadenopathy. No pelvic lymphadenopathy. Reproductive: Unremarkable Other: No peritoneal metastasis. Musculoskeletal: No aggressive osseous lesion. IMPRESSION: Chest Impression: 1. Interval enlargement necrotic prevascular lymph nodes. 2. Interval enlargement of LEFT infrahilar mass which abuts the  pulmonary vein. 3. Interval enlargement LEFT lobe peribronchial mass with postobstructive atelectasis. 4. Interval enlargement of small LEFT effusion. Abdomen / Pelvis Impression: 1. Interval enlargement of LEFT adrenal metastasis. 2. Subtle hypodense lesion in the RIGHT hepatic lobe. Consider contrast enhanced hepatic MRI for further characterization. Electronically Signed   By: Suzy Bouchard M.D.   On: 05/14/2020 11:52    Assessment/Plan Brain Metastases  Mr. Jozwiak is clinically stable following short course of dexamethasone for suspected seizure auras and subjective imbalance.  MRI demonstrates two new tiny foci of enhancement without overlap with prior radiosurgery, consistent with new metastases.  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  Left frontal lesion has stabilized without local intervention.   Case has been discussed with radiation oncology and consultation/SIM will be arranged.  Decadron should be reduced to 4mg  daily x5 days, then decreased to 2mg  daily x7 days, then 1mg  daily as prior.    We recommended continuation of Keppra 750mg  BID.  We appreciate the opportunity to participate in the care of DARSHAN SOLANKI.  We ask that KAHLEB MCCLANE return to clinic in 3 months following next post-SRS brain MRI, or sooner as needed.  All questions were answered. The patient knows to call the clinic with any problems, questions or concerns. No barriers to learning were detected.  The total time spent in the encounter was 40 minutes and more than 50% was on counseling and review of test results   Ventura Sellers,  MD Medical Director of Neuro-Oncology [facility] [place] 06/12/20 7:35 AM

## 2020-06-12 NOTE — Telephone Encounter (Signed)
Scheduled additional cycles per 8/31 los - pt to get an updated schedule next visit.

## 2020-06-12 NOTE — Progress Notes (Signed)
Has armband been applied?  Yes  Does patient have an allergy to IV contrast dye?: No   Has patient ever received premedication for IV contrast dye?: n/a  Does patient take metformin?: No  If patient does take metformin when was the last dose: n/a  Date of lab work: 06/11/2020 BUN: 11 CR: 1.07 EGfr: >60  IV site: Right AC  Has IV site been added to flowsheet?  Yes  BP (!) 130/96 (BP Location: Left Arm, Patient Position: Sitting)   Pulse 86   Temp 98.3 F (36.8 C) (Oral)   Resp 18   Ht 6' 1"  (1.854 m)   Wt 247 lb 4 oz (112.2 kg)   SpO2 99%   BMI 32.62 kg/m

## 2020-06-13 ENCOUNTER — Ambulatory Visit: Payer: Medicare Other | Admitting: Internal Medicine

## 2020-06-13 ENCOUNTER — Ambulatory Visit
Admission: RE | Admit: 2020-06-13 | Discharge: 2020-06-13 | Disposition: A | Discharge status: Home / Self Care | Payer: Medicare Other | Source: Ambulatory Visit | Attending: Radiation Oncology | Admitting: Radiation Oncology

## 2020-06-13 ENCOUNTER — Other Ambulatory Visit: Payer: Self-pay

## 2020-06-13 DIAGNOSIS — C7931 Secondary malignant neoplasm of brain: Secondary | ICD-10-CM | POA: Diagnosis not present

## 2020-06-14 ENCOUNTER — Ambulatory Visit
Admission: RE | Admit: 2020-06-14 | Discharge: 2020-06-14 | Disposition: A | Discharge status: Home / Self Care | Payer: Medicare Other | Source: Ambulatory Visit | Attending: Radiation Oncology | Admitting: Radiation Oncology

## 2020-06-14 ENCOUNTER — Other Ambulatory Visit: Payer: Self-pay

## 2020-06-14 DIAGNOSIS — C7931 Secondary malignant neoplasm of brain: Secondary | ICD-10-CM | POA: Diagnosis not present

## 2020-06-18 ENCOUNTER — Ambulatory Visit
Admission: RE | Admit: 2020-06-18 | Discharge: 2020-06-18 | Disposition: A | Discharge status: Home / Self Care | Payer: Medicare Other | Source: Ambulatory Visit | Attending: Radiation Oncology | Admitting: Radiation Oncology

## 2020-06-18 ENCOUNTER — Other Ambulatory Visit: Payer: Self-pay

## 2020-06-18 DIAGNOSIS — C7931 Secondary malignant neoplasm of brain: Secondary | ICD-10-CM | POA: Diagnosis not present

## 2020-06-19 ENCOUNTER — Encounter: Payer: Self-pay | Admitting: Radiation Oncology

## 2020-06-19 ENCOUNTER — Ambulatory Visit
Admission: RE | Admit: 2020-06-19 | Discharge: 2020-06-19 | Disposition: A | Discharge status: Home / Self Care | Payer: Medicare Other | Source: Ambulatory Visit | Attending: Radiation Oncology | Admitting: Radiation Oncology

## 2020-06-19 ENCOUNTER — Other Ambulatory Visit: Payer: Self-pay

## 2020-06-19 DIAGNOSIS — C7931 Secondary malignant neoplasm of brain: Secondary | ICD-10-CM | POA: Diagnosis not present

## 2020-06-19 NOTE — Progress Notes (Signed)
Patient rested with [place] for 15 minutes following his SRS treatment.  Patient denies headache, dizziness, nausea, diplopia or ringing in the ears. Denies fatigue. Patient without complaints. Understands to avoid strenuous activity for the next 24 hours and call [phone] with needs.  Patient walked around to treatment room to receive his treatment to his chest.   BP (!) 136/97 (BP Location: Right Arm)   Pulse 81   Temp 98.4 F (36.9 C) (Oral)   Resp 20   SpO2 99%   Skiler Tye M. Leonie Green, BSN

## 2020-06-20 ENCOUNTER — Ambulatory Visit
Admission: RE | Admit: 2020-06-20 | Discharge: 2020-06-20 | Disposition: A | Discharge status: Home / Self Care | Payer: Medicare Other | Source: Ambulatory Visit | Attending: Radiation Oncology | Admitting: Radiation Oncology

## 2020-06-20 ENCOUNTER — Other Ambulatory Visit: Payer: Self-pay

## 2020-06-20 ENCOUNTER — Other Ambulatory Visit: Payer: Self-pay | Admitting: Radiation Therapy

## 2020-06-20 DIAGNOSIS — C7931 Secondary malignant neoplasm of brain: Secondary | ICD-10-CM

## 2020-06-20 DIAGNOSIS — C7949 Secondary malignant neoplasm of other parts of nervous system: Secondary | ICD-10-CM

## 2020-06-21 ENCOUNTER — Other Ambulatory Visit: Payer: Self-pay

## 2020-06-21 ENCOUNTER — Ambulatory Visit
Admission: RE | Admit: 2020-06-21 | Discharge: 2020-06-21 | Disposition: A | Discharge status: Home / Self Care | Payer: Medicare Other | Source: Ambulatory Visit | Attending: Radiation Oncology | Admitting: Radiation Oncology

## 2020-06-21 DIAGNOSIS — C7931 Secondary malignant neoplasm of brain: Secondary | ICD-10-CM | POA: Diagnosis not present

## 2020-06-24 ENCOUNTER — Other Ambulatory Visit: Payer: Self-pay

## 2020-06-24 ENCOUNTER — Ambulatory Visit
Admission: RE | Admit: 2020-06-24 | Discharge: 2020-06-24 | Disposition: A | Discharge status: Home / Self Care | Payer: Medicare Other | Source: Ambulatory Visit | Attending: Radiation Oncology | Admitting: Radiation Oncology

## 2020-06-24 DIAGNOSIS — C7931 Secondary malignant neoplasm of brain: Secondary | ICD-10-CM | POA: Diagnosis not present

## 2020-06-25 ENCOUNTER — Ambulatory Visit
Admission: RE | Admit: 2020-06-25 | Discharge: 2020-06-25 | Disposition: A | Discharge status: Home / Self Care | Payer: Medicare Other | Source: Ambulatory Visit | Attending: Radiation Oncology | Admitting: Radiation Oncology

## 2020-06-25 ENCOUNTER — Other Ambulatory Visit: Payer: Self-pay

## 2020-06-25 DIAGNOSIS — C7931 Secondary malignant neoplasm of brain: Secondary | ICD-10-CM | POA: Diagnosis not present

## 2020-06-26 ENCOUNTER — Ambulatory Visit
Admission: RE | Admit: 2020-06-26 | Discharge: 2020-06-26 | Disposition: A | Discharge status: Home / Self Care | Payer: Medicare Other | Source: Ambulatory Visit | Attending: Radiation Oncology | Admitting: Radiation Oncology

## 2020-06-26 ENCOUNTER — Other Ambulatory Visit: Payer: Self-pay

## 2020-06-26 DIAGNOSIS — C7931 Secondary malignant neoplasm of brain: Secondary | ICD-10-CM | POA: Diagnosis not present

## 2020-06-27 ENCOUNTER — Ambulatory Visit
Admission: RE | Admit: 2020-06-27 | Discharge: 2020-06-27 | Disposition: A | Discharge status: Home / Self Care | Payer: Medicare Other | Source: Ambulatory Visit | Attending: Radiation Oncology | Admitting: Radiation Oncology

## 2020-06-27 ENCOUNTER — Other Ambulatory Visit: Payer: Self-pay

## 2020-06-27 ENCOUNTER — Other Ambulatory Visit: Payer: Self-pay | Admitting: Radiation Oncology

## 2020-06-27 DIAGNOSIS — C7931 Secondary malignant neoplasm of brain: Secondary | ICD-10-CM | POA: Diagnosis not present

## 2020-06-27 MED ORDER — PROAIR HFA 108 (90 BASE) MCG/ACT IN AERS: 2.0000 | INHALATION_SPRAY | RESPIRATORY_TRACT | 5 refills | Status: AC | PRN

## 2020-06-27 MED ORDER — SUCRALFATE 1 G PO TABS: ORAL_TABLET | ORAL | 2 refills | Status: AC

## 2020-06-27 NOTE — Progress Notes (Signed)
The patient's wife called to let me know that Calvin Wagner is out of his proair and needs a refill. I will send this into his pharmacy. She also mentioned he is having some symptoms of pain with swallowing. 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 I will send in a new prescription for carafate. We discussed mylanta as well prn and she will let me know if his symptoms are persistent and we could add more medications if needed.

## 2020-06-28 ENCOUNTER — Encounter: Payer: Self-pay | Admitting: Radiation Oncology

## 2020-06-28 ENCOUNTER — Ambulatory Visit
Admission: RE | Admit: 2020-06-28 | Discharge: 2020-06-28 | Disposition: A | Discharge status: Home / Self Care | Payer: Medicare Other | Source: Ambulatory Visit | Attending: Radiation Oncology | Admitting: Radiation Oncology

## 2020-06-28 ENCOUNTER — Other Ambulatory Visit: Payer: Self-pay

## 2020-06-28 DIAGNOSIS — C7931 Secondary malignant neoplasm of brain: Secondary | ICD-10-CM | POA: Diagnosis not present

## 2020-07-09 ENCOUNTER — Inpatient Hospital Stay (HOSPITAL_BASED_OUTPATIENT_CLINIC_OR_DEPARTMENT_OTHER): Payer: Medicare Other | Admitting: Internal Medicine

## 2020-07-09 ENCOUNTER — Telehealth: Payer: Self-pay | Admitting: *Deleted

## 2020-07-09 ENCOUNTER — Inpatient Hospital Stay: Payer: Medicare Other | Attending: Internal Medicine

## 2020-07-09 ENCOUNTER — Inpatient Hospital Stay: Payer: Medicare Other

## 2020-07-09 ENCOUNTER — Other Ambulatory Visit: Payer: Self-pay

## 2020-07-09 ENCOUNTER — Encounter: Payer: Self-pay | Admitting: Internal Medicine

## 2020-07-09 ENCOUNTER — Other Ambulatory Visit: Payer: Self-pay | Admitting: Internal Medicine

## 2020-07-09 VITALS — BP 137/101 | HR 44 | Temp 97.2°F | Resp 17 | Ht 73.0 in | Wt 251.0 lb

## 2020-07-09 VITALS — BP 145/87

## 2020-07-09 DIAGNOSIS — C3492 Malignant neoplasm of unspecified part of left bronchus or lung: Secondary | ICD-10-CM

## 2020-07-09 DIAGNOSIS — C3432 Malignant neoplasm of lower lobe, left bronchus or lung: Secondary | ICD-10-CM | POA: Insufficient documentation

## 2020-07-09 DIAGNOSIS — Z79899 Other long term (current) drug therapy: Secondary | ICD-10-CM | POA: Insufficient documentation

## 2020-07-09 DIAGNOSIS — Z923 Personal history of irradiation: Secondary | ICD-10-CM | POA: Diagnosis not present

## 2020-07-09 DIAGNOSIS — Z5112 Encounter for antineoplastic immunotherapy: Secondary | ICD-10-CM

## 2020-07-09 DIAGNOSIS — C7931 Secondary malignant neoplasm of brain: Secondary | ICD-10-CM

## 2020-07-09 DIAGNOSIS — R9431 Abnormal electrocardiogram [ECG] [EKG]: Secondary | ICD-10-CM | POA: Diagnosis not present

## 2020-07-09 DIAGNOSIS — C7A1 Malignant poorly differentiated neuroendocrine tumors: Secondary | ICD-10-CM | POA: Diagnosis present

## 2020-07-09 DIAGNOSIS — C3490 Malignant neoplasm of unspecified part of unspecified bronchus or lung: Secondary | ICD-10-CM

## 2020-07-09 DIAGNOSIS — R5383 Other fatigue: Secondary | ICD-10-CM

## 2020-07-09 LAB — CBC WITH DIFFERENTIAL (CANCER CENTER ONLY)
Abs Immature Granulocytes: 0.02 10*3/uL (ref 0.00–0.07)
Basophils Absolute: 0 10*3/uL (ref 0.0–0.1)
Basophils Relative: 0 %
Eosinophils Absolute: 0.1 10*3/uL (ref 0.0–0.5)
Eosinophils Relative: 1 %
HCT: 44.9 % (ref 39.0–52.0)
Hemoglobin: 15.6 g/dL (ref 13.0–17.0)
Immature Granulocytes: 0 %
Lymphocytes Relative: 9 %
Lymphs Abs: 0.4 10*3/uL — ABNORMAL LOW (ref 0.7–4.0)
MCH: 32.7 pg (ref 26.0–34.0)
MCHC: 34.7 g/dL (ref 30.0–36.0)
MCV: 94.1 fL (ref 80.0–100.0)
Monocytes Absolute: 0.6 10*3/uL (ref 0.1–1.0)
Monocytes Relative: 12 %
Neutro Abs: 3.8 10*3/uL (ref 1.7–7.7)
Neutrophils Relative %: 78 %
Platelet Count: 156 10*3/uL (ref 150–400)
RBC: 4.77 MIL/uL (ref 4.22–5.81)
RDW: 13.1 % (ref 11.5–15.5)
WBC Count: 4.8 10*3/uL (ref 4.0–10.5)
nRBC: 0 % (ref 0.0–0.2)

## 2020-07-09 LAB — CMP (CANCER CENTER ONLY)
ALT: 27 U/L (ref 0–44)
AST: 14 U/L — ABNORMAL LOW (ref 15–41)
Albumin: 3.1 g/dL — ABNORMAL LOW (ref 3.5–5.0)
Alkaline Phosphatase: 88 U/L (ref 38–126)
Anion gap: 9 (ref 5–15)
BUN: 8 mg/dL (ref 6–20)
CO2: 26 mmol/L (ref 22–32)
Calcium: 9.1 mg/dL (ref 8.9–10.3)
Chloride: 100 mmol/L (ref 98–111)
Creatinine: 1.18 mg/dL (ref 0.61–1.24)
GFR, Est AFR Am: >60 mL/min (ref >60)
GFR, Estimated: >60 mL/min (ref >60)
Glucose, Bld: 147 mg/dL — ABNORMAL HIGH (ref 70–99)
Potassium: 4 mmol/L (ref 3.5–5.1)
Sodium: 135 mmol/L (ref 135–145)
Total Bilirubin: 0.8 mg/dL (ref 0.3–1.2)
Total Protein: 6.8 g/dL (ref 6.5–8.1)

## 2020-07-09 LAB — TSH: TSH: 2.234 u[IU]/mL (ref 0.320–4.118)

## 2020-07-09 MED ORDER — SODIUM CHLORIDE 0.9 % IV SOLN
Freq: Once | INTRAVENOUS | Status: AC
  Filled 2020-07-09: qty 250

## 2020-07-09 MED ORDER — SODIUM CHLORIDE 0.9 % IV SOLN
480.0000 mg | Freq: Once | INTRAVENOUS | Status: AC
  Administered 2020-07-09: 480 mg via INTRAVENOUS
  Filled 2020-07-09: qty 48

## 2020-07-09 NOTE — Telephone Encounter (Signed)
Received call from pt's wife stating that PCP thought that pt should be seen more urgent & monitored & was told by Dr Justin Mend at PCP office that he didn't think that losartan or amlodipine would have caused pt's heart to slow.  Called Cardiology office to check on referral & they did receive.  Appt made for 07/15/20 at 9:20 am with Dr Eleonore Chiquito at [facility] office.  Faxed copy of EKG &left message for wife regarding appt.  Dr Julien Nordmann felt that this was early enough but to go to ED if symptoms.  That message was also left on wife's vm.

## 2020-07-09 NOTE — Patient Instructions (Signed)
[facility] Discharge Instructions for Patients Receiving Chemotherapy  Today you received the following chemotherapy agents Opdivo  To help prevent nausea and vomiting after your treatment, we encourage you to take your nausea medication as directed   If you develop nausea and vomiting that is not controlled by your nausea medication, call the clinic.   BELOW ARE SYMPTOMS THAT SHOULD BE REPORTED IMMEDIATELY:  *FEVER GREATER THAN 100.5 F  *CHILLS WITH OR WITHOUT FEVER  NAUSEA AND VOMITING THAT IS NOT CONTROLLED WITH YOUR NAUSEA MEDICATION  *UNUSUAL SHORTNESS OF BREATH  *UNUSUAL BRUISING OR BLEEDING  TENDERNESS IN MOUTH AND THROAT WITH OR WITHOUT PRESENCE OF ULCERS  *URINARY PROBLEMS  *BOWEL PROBLEMS  UNUSUAL RASH Items with * indicate a potential emergency and should be followed up as soon as possible.  Feel free to call the clinic should you have any questions or concerns. The clinic phone number is [phone].  Please show the CHEMO ALERT CARD at check-in to the Emergency Department and triage nurse.   

## 2020-07-09 NOTE — Progress Notes (Signed)
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  PRIOR THERAPY:  1) Stereotactic radiotherapy to a solitary brain metastasis under the care of Dr. Lisbeth Renshaw on 01/25/2017. 2) Short course of concurrent chemoradiation with weekly carboplatin for AUC of 2 and paclitaxel 45 MG/M2 to the locally advanced disease in the chest. Status post 3 cycles. 3) Systemic chemotherapy with carboplatin for AUC of 5, Alimta 500 MG/M2 and Avastin 15 MG/KG every 3 weeks. First dose of 02/15/2017. Status post 6 cycles. Last dose 06/08/2017 with stable disease. 4) whole brain irradiation under the care of Dr. Lisbeth Renshaw expected to complete on October 14, 2018. 5) Maintenance systemic chemotherapy with Alimta 500 MG/M2 in addition to Avastin 15 MG/KG every 3 weeks. First dose 07/13/2017.  Status post 20 cycles.  Starting from cycle #16 he will be treated with single agent Alimta only secondary to intolerance. 6) short course of palliative radiotherapy to the enlarging left hilar and mediastinal lymph nodes under the care of Dr. Lisbeth Renshaw.   CURRENT THERAPY: Second line treatment with immunotherapy with nivolumab 480 mg IV every 4 weeks.  First dose October 26, 2018.  Status post 22 cycles.  INTERVAL HISTORY: Calvin Wagner 54 y.o. male returns to the clinic today for follow-up visit accompanied by his wife.  The patient is feeling fine today with no concerning complaints except for irregular heart rate.  He denied having any chest pain, shortness of breath, cough or hemoptysis.  He denied having any  diaphoresis.  He has no headache or visual changes but has occasional nausea with no vomiting, diarrhea or constipation.  He continues to tolerate his treatment with nivolumab fairly well.  He is here for evaluation before starting cycle #23.  MEDICAL HISTORY: Past Medical History:  Diagnosis Date  . Encounter for antineoplastic chemotherapy 12/31/2016  . GERD 11/08/2009   Qualifier: Diagnosis of  By: Ronnald Ramp MD, Arvid Right Goals of care, counseling/discussion 12/31/2016  . History of radiation therapy 01/13/2017 - 02/02/2017   Site/dose:  Left lung: 30 Gy in 15 fractions  . HYPERTENSION 11/08/2009   Qualifier: Diagnosis of  By: Ronnald Ramp MD, Arvid Right.   . 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 Migraine 02/25/2012  . Pneumonia   . PONV (postoperative nausea and vomiting)   . Rotator cuff tear, left     ALLERGIES:  has No Known Allergies.  MEDICATIONS:  Current Outpatient Medications  Medication Sig Dispense Refill  . amLODipine (NORVASC) 5 MG tablet Take 5 mg by mouth daily.    . chlorpheniramine-HYDROcodone (TUSSIONEX) 10-8 MG/5ML SUER Take 5 mLs by mouth every 12 (twelve) hours as needed for cough. (Patient not taking: Reported on 01/02/2020) 240 mL 0  . dexamethasone (DECADRON) 2 MG tablet Take 1 tablet (2 mg total) by mouth daily.    . famotidine (PEPCID) 20 MG tablet Take 20 mg by mouth 2 (two) times daily.    . folic acid (FOLVITE) 1 MG tablet Take 1 tablet (1 mg total) by mouth daily. (Patient not taking: Reported on 01/02/2020) 30 tablet 4  .  levETIRAcetam (KEPPRA) 750 MG tablet TAKE 1 TABLET(750 MG) BY MOUTH TWICE DAILY 60 tablet 3  . LORazepam (ATIVAN) 1 MG tablet TAKE ONE TAB BY MOUTH 30 MINUTES PRIOR TO MRI OR RADIOTHERAPY. 30 tablet 0  . losartan (COZAAR) 50 MG tablet Take 50 mg by mouth daily.    . magic mouthwash SOLN Take 5 mLs by mouth 4 (four) times daily as needed for mouth pain. 1:1:1 mix-Nystatin. Benadryl and extra strength maalox. (Patient not  taking: Reported on 01/02/2020) 240 mL 0  . Multiple Vitamin (MULTIVITAMIN WITH MINERALS) TABS tablet Take 1 tablet by mouth daily.    . ondansetron (ZOFRAN) 8 MG tablet TAKE 1 TABLET BY MOUTH EVERY 8 HOURS AS NEEDED FOR NAUSEA OR VOMITING. (Patient not taking: Reported on 01/02/2020) 90 tablet 0  . PROAIR HFA 108 (90 Base) MCG/ACT inhaler Inhale 2 puffs into the lungs every 4 (four) hours as needed. 1 each 5  . prochlorperazine (COMPAZINE) 10 MG tablet TAKE 1 TABLET BY MOUTH EVERY 6 HOURS AS NEEDED FOR NAUSEA OR VOMITING (Patient not taking: Reported on 01/02/2020) 90 tablet 0  . sucralfate (CARAFATE) 1 g tablet Crush and mix in 1 oz water and drink 5 min TID before meals and at HS for radiation induced esophagitis 120 tablet 2  . traMADol (ULTRAM) 50 MG tablet TAKE 1 TABLET BY MOUTH EVERY 12 HOURS AS NEEDED (Patient not taking: Reported on 01/02/2020) 30 tablet 0  . XARELTO 20 MG TABS tablet TAKE 1 TABLET(20 MG) BY MOUTH DAILY WITH SUPPER 30 tablet 2   No current facility-administered medications for this visit.    SURGICAL HISTORY:  Past Surgical History:  Procedure Laterality Date  . INGUINAL HERNIA REPAIR    . SHOULDER ARTHROSCOPY WITH ROTATOR CUFF REPAIR Left 11/12/2016   Procedure: SHOULDER ARTHROSCOPY WITH ROTATOR CUFF REPAIR AND SUBACROMIAL DECOMPRESSION;  Surgeon: Tania Ade, MD;  Location: [facility];  Service: Orthopedics;  Laterality: Left;  SHOULDER ARTHROSCOPY WITH ROTATOR CUFF REPAIR AND SUBACROMIAL DECOMPRESSION  . TONSILLECTOMY    . TOTAL KNEE ARTHROPLASTY    . VIDEO BRONCHOSCOPY Bilateral 12/10/2016   Procedure: VIDEO BRONCHOSCOPY WITHOUT FLUORO;  Surgeon: Tanda Rockers, MD;  Location: [facility];  Service: Cardiopulmonary;  Laterality: Bilateral;    REVIEW OF SYSTEMS:  Constitutional: positive for fatigue Eyes: negative Ears, nose, mouth, throat, and face: negative Respiratory: negative Cardiovascular: positive for irregular heart beat Gastrointestinal: positive for  nausea Genitourinary:negative Integument/breast: negative Hematologic/lymphatic: negative Musculoskeletal:negative Neurological: negative Behavioral/Psych: negative Endocrine: negative Allergic/Immunologic: negative   PHYSICAL EXAMINATION: General appearance: alert, cooperative, fatigued and no distress Head: Normocephalic, without obvious abnormality, atraumatic Neck: no adenopathy, no JVD, supple, symmetrical, trachea midline and thyroid not enlarged, symmetric, no tenderness/mass/nodules Lymph nodes: Cervical, supraclavicular, and axillary nodes normal. Resp: clear to auscultation bilaterally Back: symmetric, no curvature. ROM normal. No CVA tenderness. Cardio: regular rate and rhythm, S1, S2 normal, no murmur, click, rub or gallop GI: soft, non-tender; bowel sounds normal; no masses,  no organomegaly Extremities: extremities normal, atraumatic, no cyanosis or edema Neurologic: Alert and oriented X 3, normal strength and tone. Normal symmetric reflexes. Normal coordination and gait  ECOG PERFORMANCE STATUS: 1 - Symptomatic but completely ambulatory  Blood pressure (!) 137/101, pulse (!) 44, temperature (!) 97.2 F (36.2 C), temperature source Tympanic, resp. rate 17, height 6\' 1"  (1.854 m), weight 251 lb (113.9 kg), SpO2 98 %.  LABORATORY DATA: Lab Results  Component Value Date   WBC 4.8 07/09/2020   HGB 15.6 07/09/2020  HCT 44.9 07/09/2020   MCV 94.1 07/09/2020   PLT 156 07/09/2020      Chemistry      Component Value Date/Time   NA 136 06/11/2020 0924   NA 138 10/08/2017 0907   K 3.9 06/11/2020 0924   K 4.1 10/08/2017 0907   CL 102 06/11/2020 0924   CO2 25 06/11/2020 0924   CO2 24 10/08/2017 0907   BUN 11 06/11/2020 0924   BUN 11.7 10/08/2017 0907   CREATININE 1.07 06/11/2020 0924   CREATININE 1.2 10/08/2017 0907      Component Value Date/Time   CALCIUM 9.5 06/11/2020 0924   CALCIUM 9.5 10/08/2017 0907   ALKPHOS 75 06/11/2020 0924   ALKPHOS 88 10/08/2017  0907   AST 18 06/11/2020 0924   AST 22 10/08/2017 0907   ALT 30 06/11/2020 0924   ALT 24 10/08/2017 0907   BILITOT 0.5 06/11/2020 0924   BILITOT 0.42 10/08/2017 0907       RADIOGRAPHIC STUDIES: No results found.  ASSESSMENT AND PLAN: This is a very pleasant 54 years old white male with stage IV non-small cell lung cancer, high-grade neuroendocrine carcinoma presented with locally advanced disease in the chest as well as solitary brain metastasis and 2 liver lesions. The patient is status post stereotactic radiotherapy to the solitary brain metastasis as well as short course of concurrent chemoradiation to the locally advanced disease in the chest. He recently completed 6 cycles of systemic chemotherapy with carboplatin, Alimta and Avastin and tolerated this treatment fairly well. He was started on maintenance treatment with Alimta and Avastin status post 15 cycles.  Avastin was discontinued secondary to intolerance to the combination treatment.  He is currently on treatment with single agent Alimta status post 5 cycles. He has been tolerating his treatment well. Unfortunately he was recently found to have multiple metastatic brain lesion and the patient underwent whole brain irradiation. His scan showed evidence for disease progression with enlargement of left lower lobe lung nodule in addition to hilar lymphadenopathy as well as progression of the liver lesion. He was started on treatment with nivolumab 480 mg IV every 4 weeks status post 22 cycles.  The patient continues to tolerate his treatment with immunotherapy fairly well with no concerning adverse effects. I recommended for him to proceed with cycle #23 today as planned.  EKG performed at today for evaluation of his cardiac arrhythmia showed QT prolongation but this is unlikely to be related to Opdivo according to study performed and published at the [facility], 2016 Mar; 77(3):635-41. I recommended for the patient to  proceed with his treatment with Opdivo today. He will come back for follow-up visit in 4 weeks for evaluation with repeat CT scan of the chest for restaging of his disease. I also made an urgent referral to cardiology for evaluation of his condition with the QT prolongation and to adjust any culprit medications that could be contributing to his condition.  He was also advised to hold his treatment with Compazine and lorazepam for now. The patient was advised to call immediately if he has any other concerning symptoms in the interval.  The patient voices understanding of current disease status and treatment options and is in agreement with the current care plan. All questions were answered. The patient knows to call the clinic with any problems, questions or concerns. We can certainly see the patient much sooner if necessary.  Disclaimer: This note was dictated with voice recognition software. Similar sounding words can inadvertently  be transcribed and may not be corrected upon review.

## 2020-07-10 ENCOUNTER — Telehealth: Payer: Self-pay | Admitting: *Deleted

## 2020-07-10 ENCOUNTER — Telehealth: Payer: Self-pay | Admitting: Internal Medicine

## 2020-07-10 NOTE — Telephone Encounter (Signed)
Dr Julien Nordmann suggested taking Gaston to the [facility] ED. LM on Gail's phone

## 2020-07-10 NOTE — Telephone Encounter (Signed)
Next two cycles already scheduled per 9/28 LOS - no additional appts added. Central radiology to contact pt with ct scan

## 2020-07-10 NOTE — Telephone Encounter (Signed)
Wife states "Calvin Wagner woke up not feeling good, he is dizzy and having nausea". HR has been fluctuating 40-130, is mainly 115-138. HR was 44 at [facility] yesterday. "He has a very bumpy rash on his face, mainly from mask, but is also above mask area'.   Appt with cardiologist is Monday, she was concerned about waiting that long to see him. She called PCP who told her losartin and amlodopine would not be the cause of his irregular HR issue.   Does not want to take him to ED due to Covid concerns.

## 2020-07-14 NOTE — Progress Notes (Signed)
Cardiology Office Note:   Date:  07/15/2020  NAME:  Calvin Wagner    MRN: [id number] DOB:  1966-03-06   PCP:  Calvin Pepper, MD  Cardiologist:  No primary care provider on file.   Referring MD: Calvin Bears, MD   Chief Complaint  Patient presents with  . Abnormal ECG   History of Present Illness:   Calvin Wagner is a 54 y.o. male with a hx of stage IV lung cancer, HTN who presents for the evaluation of abnormal EKG at the request of Calvin Bears, MD. He was seen by his oncologist on 9/28 with irregular rhythm. Reported to have prolonged QT interval. Referral to [place] made. His EKG today demonstrates sinus tachycardia with a heart rate 120.  QTC is 426.  I did review the EKG from his oncologist office.  I suspect that the QT interval was over read.  I did review it and is less than half the RR interval.  This is normal.  He has had sinus tachycardia from what I can tell up to 3 years.  Heart rate is always been a little high.  He does report he is anxious today.  Most recent TSH normal 2.23.  He reports he can feel a little weak at times.  He is just recently come off steroid therapy.  Found to have new brain mets and had to undergo a round of radiation therapy as well.  He reports that his oncologist would like for him to come off steroids.  He did have an echo in 2015 that was normal.  His cancer is not curable but treatable.  He is on Opdivo.  His oncologist inquired about QTC prolongation in the setting of this.  Again, his QTC appears to be normal.  He reports that he does not have any structured exercise program but does do yard work and things around the house.  He has no limitations doing this.  He reports that on pulse ox his heart rate was in the forties but then on manual check it was in the one twenties.  This was alarming to him.  As far as I can tell his heart rate has been high for years.  He denies any chest pain.  Can get short of breath.  He does have a family history of heart  disease in his mother as well as a heart attack and a brother age 60.  He is never had a heart attack or stroke.  I did review his CT of his chest.  This demonstrates no coronary calcium.  He reports no excess caffeine consumption.  No energy drinks.  There are no concerns for sleep apnea per his wife who presents with him today.  He really is without complaints.  He is a bit fatigued and tired at times but this is after coming off steroids.  Heart rate has been high for years.  Problem List 1. 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 HTN 3.  Sinus tachycardia -TSH 2.23 -Recent chest CT with 0 coronary calcium 4.  Cholesterol 2017: Total 277, HDL 70, LDL 187, triglycerides 103   Past Medical History: Past Medical History:  Diagnosis Date  . Encounter for antineoplastic chemotherapy 12/31/2016  . GERD 11/08/2009   Qualifier: Diagnosis of  By: Ronnald Ramp MD, Arvid Right Goals of care, counseling/discussion 12/31/2016  . History of radiation therapy 01/13/2017 - 02/02/2017   Site/dose:  Left lung: 30 Gy in 15 fractions  .  HYPERTENSION 11/08/2009   Qualifier: Diagnosis of  By: Ronnald Ramp MD, Arvid Right.   . Large cell carcinoma of left lung, stage 4 ([place]) 12/31/2016  . Migraine 02/25/2012  . Pneumonia   . PONV (postoperative nausea and vomiting)   . Rotator cuff tear, left     Past Surgical History: Past Surgical History:  Procedure Laterality Date  . INGUINAL HERNIA REPAIR    . SHOULDER ARTHROSCOPY WITH ROTATOR CUFF REPAIR Left 11/12/2016   Procedure: SHOULDER ARTHROSCOPY WITH ROTATOR CUFF REPAIR AND SUBACROMIAL DECOMPRESSION;  Surgeon: Tania Ade, MD;  Location: [facility];  Service: Orthopedics;  Laterality: Left;  SHOULDER ARTHROSCOPY WITH ROTATOR CUFF REPAIR AND SUBACROMIAL DECOMPRESSION  . TONSILLECTOMY    . TOTAL KNEE ARTHROPLASTY    . VIDEO BRONCHOSCOPY Bilateral 12/10/2016   Procedure: VIDEO BRONCHOSCOPY WITHOUT FLUORO;  Surgeon: Tanda Rockers, MD;  Location: [facility]  END[facility]COPY;  Service: Cardiopulmonary;  Laterality: Bilateral;    Current Medications: Current Meds  Medication Sig  . amLODipine (NORVASC) 5 MG tablet Take 5 mg by mouth daily.  . chlorpheniramine-HYDROcodone (TUSSIONEX) 10-8 MG/5ML SUER Take 5 mLs by mouth every 12 (twelve) hours as needed for cough.  . dexamethasone (DECADRON) 2 MG tablet Take 1 tablet (2 mg total) by mouth daily.  . famotidine (PEPCID) 20 MG tablet Take 20 mg by mouth 2 (two) times daily.  . folic acid (FOLVITE) 1 MG tablet Take 1 tablet (1 mg total) by mouth daily.  Marland Kitchen levETIRAcetam (KEPPRA) 750 MG tablet TAKE 1 TABLET(750 MG) BY MOUTH TWICE DAILY  . LORazepam (ATIVAN) 1 MG tablet TAKE ONE TAB BY MOUTH 30 MINUTES PRIOR TO MRI OR RADIOTHERAPY.  Marland Kitchen losartan (COZAAR) 50 MG tablet Take 50 mg by mouth daily.  . magic mouthwash SOLN Take 5 mLs by mouth 4 (four) times daily as needed for mouth pain. 1:1:1 mix-Nystatin. Benadryl and extra strength maalox.  . Multiple Vitamin (MULTIVITAMIN WITH MINERALS) TABS tablet Take 1 tablet by mouth daily.  . ondansetron (ZOFRAN) 8 MG tablet TAKE 1 TABLET BY MOUTH EVERY 8 HOURS AS NEEDED FOR NAUSEA OR VOMITING.  Marland Kitchen PROAIR HFA 108 (90 Base) MCG/ACT inhaler Inhale 2 puffs into the lungs every 4 (four) hours as needed.  . prochlorperazine (COMPAZINE) 10 MG tablet TAKE 1 TABLET BY MOUTH EVERY 6 HOURS AS NEEDED FOR NAUSEA OR VOMITING  . sucralfate (CARAFATE) 1 g tablet Crush and mix in 1 oz water and drink 5 min TID before meals and at HS for radiation induced esophagitis  . traMADol (ULTRAM) 50 MG tablet TAKE 1 TABLET BY MOUTH EVERY 12 HOURS AS NEEDED  . XARELTO 20 MG TABS tablet TAKE 1 TABLET(20 MG) BY MOUTH DAILY WITH SUPPER     Allergies:    Patient has no known allergies.   Social History: Social History   Socioeconomic History  . Marital status: Married    Spouse name: Baker Janus  . Number of children: 2  . Years of education: Not on file  . Highest education level: Not on file   Occupational History  . Occupation: Material Therapist, sports: [employer]  Tobacco Use  . Smoking status: Never Smoker  . Smokeless tobacco: Never Used  Vaping Use  . Vaping Use: Never used  Substance and Sexual Activity  . Alcohol use: Yes    Comment: social  . Drug use: No  . Sexual activity: Not Currently  Other Topics Concern  . Not on file  Social History Narrative   Lives  at home wife and 2 kids   Caffeine use: none     Social Determinants of Health   Financial Resource Strain:   . Difficulty of Paying Living Expenses: Not on file  Food Insecurity:   . Worried About Charity fundraiser in the Last Year: Not on file  . Ran Out of Food in the Last Year: Not on file  Transportation Needs:   . Lack of Transportation (Medical): Not on file  . Lack of Transportation (Non-Medical): Not on file  Physical Activity:   . Days of Exercise per Week: Not on file  . Minutes of Exercise per Session: Not on file  Stress:   . Feeling of Stress : Not on file  Social Connections:   . Frequency of Communication with Friends and Family: Not on file  . Frequency of Social Gatherings with Friends and Family: Not on file  . Attends Religious Services: Not on file  . Active Member of Clubs or Organizations: Not on file  . Attends Archivist Meetings: Not on file  . Marital Status: Not on file     Family History: The patient's family history includes CAD in an other family member; Heart attack (age of onset: 77) in his brother; Heart disease (age of onset: 67) in his mother; Lung cancer in an other family member; Mental retardation in an other family member. There is no history of Alcohol abuse, Diabetes, Early death, Hyperlipidemia, Hypertension, Kidney disease, Stroke, or Migraines.  ROS:   All other ROS reviewed and negative. Pertinent positives noted in the HPI.     EKGs/Labs/Other Studies Reviewed:   The following studies were personally reviewed by me  today:  EKG:  EKG is ordered today.  The ekg ordered today demonstrates sinus tachycardia, heart rate 120, no acute ischemic changes, no evidence of prior infarction, and was personally reviewed by me.   Recent Labs: 07/09/2020: ALT 27; BUN 8; Creatinine 1.18; Hemoglobin 15.6; Platelet Count 156; Potassium 4.0; Sodium 135; TSH 2.234   Recent Lipid Panel No results found for: CHOL, TRIG, HDL, CHOLHDL, VLDL, LDLCALC, LDLDIRECT  Physical Exam:   VS:  BP 124/82   Pulse (!) 120   Ht 6\' 1"  (1.854 m)   Wt 248 lb 9.6 oz (112.8 kg)   SpO2 100%   BMI 32.80 kg/m    Wt Readings from Last 3 Encounters:  07/15/20 248 lb 9.6 oz (112.8 kg)  07/09/20 251 lb (113.9 kg)  06/12/20 247 lb 4 oz (112.2 kg)    General: Well nourished, well developed, in no acute distress Heart: Atraumatic, normal size  Eyes: PEERLA, EOMI  Neck: Supple, no JVD Endocrine: No thryomegaly Cardiac: Normal S1, S2; RRR; no murmurs, rubs, or gallops Lungs: Clear to auscultation bilaterally, no wheezing, rhonchi or rales  Abd: Soft, nontender, no hepatomegaly  Ext: No edema, pulses 2+ Musculoskeletal: No deformities, BUE and BLE strength normal and equal Skin: Warm and dry, no rashes   Neuro: Alert and oriented to person, place, time, and situation, CNII-XII grossly intact, no focal deficits  Psych: Normal mood and affect   ASSESSMENT:   ZHAMIR PIRRO is a 54 y.o. male who presents for the following: 1. Nonspecific abnormal electrocardiogram (ECG) (EKG)   2. Tachycardia   3. Primary hypertension    PLAN:   1. Nonspecific abnormal electrocardiogram (ECG) (EKG) 2. Tachycardia -He presents with sinus tachycardia.  QTc interval is normal.  I suspect the QT interval was artificially read as prolonged  on the ECG at his oncologist office.  I am not concerned about his QTc interval. -He has had elevated heart rates for over 3 years.  Appears to always be sinus tachycardia.  Recent TSH is normal.  No excess caffeine or energy  drinks reported.  I suspect this is exacerbated by coming off steroids.  We will proceed with B12 as well as iron levels.  I would also like to proceed with a 7-day Zio patch to exclude any arrhythmia.  We will also get an idea what his average heart rate is.  He also needs an echocardiogram. -Recent CT chest obtained which I personally reviewed.  No evidence of coronary calcium.  This is reassuring.  He does have a family history of heart disease. -Suspect his sinus tachycardia is all secondary to his malignancy as well as intermittent cancer treatments.  I would not treat this currently.  We will see what his monitor echo show.  3. Primary hypertension -Continue amlodipine and losartan.   Disposition: No follow-ups on file.  Medication Adjustments/Labs and Tests Ordered: Current medicines are reviewed at length with the patient today.  Concerns regarding medicines are outlined above.  Orders Placed This Encounter  Procedures  . Iron, TIBC and Ferritin Panel  . B12  . LONG TERM MONITOR (3-14 DAYS)  . ECHOCARDIOGRAM COMPLETE   No orders of the defined types were placed in this encounter.   Patient Instructions  Medication Instructions:  The current medical regimen is effective;  continue present plan and medications.  *If you need a refill on your cardiac medications before your next appointment, please call your pharmacy*   Lab Work: IRON PROFILE, VITAMIN B12  If you have labs (blood work) drawn today and your tests are completely normal, you will receive your results only by: Marland Kitchen MyChart Message (if you have MyChart) OR . A paper copy in the mail If you have any lab test that is abnormal or we need to change your treatment, we will call you to review the results.   Testing/Procedures: Echocardiogram - Your physician has requested that you have an echocardiogram. Echocardiography is a painless test that uses sound waves to create images of your heart. It provides your doctor  with information about the size and shape of your heart and how well your heart's chambers and valves are working. This procedure takes approximately one hour. There are no restrictions for this procedure. This will be performed at our [facility] location - [address].  Your physician has recommended that you wear a 7 DAY ZIO-PATCH monitor. The Zio patch cardiac monitor continuously records heart rhythm data, this is for patients being evaluated for multiple types heart rhythms. For the first 24 hours post application, please avoid getting the Zio monitor wet in the shower or by excessive sweating during exercise. After that, feel free to carry on with regular activities. Keep soaps and lotions away from the ZIO XT Patch.  Someone will be calling you to let you know when this is mailed.  This will be mailed to you, please expect 7-10 days to receive.      Call [place] at [phone] if you have questions regarding your ZIO XT patch monitor.  Call them immediately if you see an orange light blinking on your monitor.   If your monitor falls off in less than 4 days contact our Monitor department at [phone].  If your monitor becomes loose or falls off after  4 days call [employer] at [phone] for suggestions on securing your monitor     Follow-Up: At [facility], you and your health needs are our priority.  As part of our continuing mission to provide you with exceptional heart care, we have created designated Provider Care Teams.  These Care Teams include your primary Cardiologist (physician) and Advanced Practice Providers (APPs -  Physician Assistants and Nurse Practitioners) who all work together to provide you with the care you need, when you need it.  We recommend signing up for the patient portal called "MyChart".  Sign up information is provided on this After Visit Summary.  MyChart is used to connect with patients for Virtual Visits  (Telemedicine).  Patients are able to view lab/test results, encounter notes, upcoming appointments, etc.  Non-urgent messages can be sent to your provider as well.   To learn more about what you can do with MyChart, go to [email or web address].    Your next appointment:   3 month(s)  The format for your next appointment:   In Person  Provider:   Eleonore Chiquito, MD       Signed, Addison Naegeli. Audie Box, [place]  [address] [place], [place] [address] [phone]  07/15/2020 10:11 AM

## 2020-07-15 ENCOUNTER — Encounter: Payer: Self-pay | Admitting: Cardiovascular Disease

## 2020-07-15 ENCOUNTER — Other Ambulatory Visit: Payer: Self-pay

## 2020-07-15 ENCOUNTER — Ambulatory Visit (INDEPENDENT_AMBULATORY_CARE_PROVIDER_SITE_OTHER): Payer: Medicare Other | Admitting: Cardiovascular Disease

## 2020-07-15 VITALS — BP 124/82 | HR 120 | Ht 73.0 in | Wt 248.6 lb

## 2020-07-15 DIAGNOSIS — R9431 Abnormal electrocardiogram [ECG] [EKG]: Secondary | ICD-10-CM

## 2020-07-15 DIAGNOSIS — R000 Tachycardia, unspecified: Secondary | ICD-10-CM | POA: Diagnosis not present

## 2020-07-15 DIAGNOSIS — I10 Essential (primary) hypertension: Secondary | ICD-10-CM

## 2020-07-15 NOTE — Patient Instructions (Signed)
Medication Instructions:  The current medical regimen is effective;  continue present plan and medications.  *If you need a refill on your cardiac medications before your next appointment, please call your pharmacy*   Lab Work: IRON PROFILE, VITAMIN B12  If you have labs (blood work) drawn today and your tests are completely normal, you will receive your results only by: Marland Kitchen MyChart Message (if you have MyChart) OR . A paper copy in the mail If you have any lab test that is abnormal or we need to change your treatment, we will call you to review the results.   Testing/Procedures: Echocardiogram - Your physician has requested that you have an echocardiogram. Echocardiography is a painless test that uses sound waves to create images of your heart. It provides your doctor with information about the size and shape of your heart and how well your heart's chambers and valves are working. This procedure takes approximately one hour. There are no restrictions for this procedure. This will be performed at our [facility] location - [address], [address].  Your physician has recommended that you wear a 7 DAY ZIO-PATCH monitor. The Zio patch cardiac monitor continuously records heart rhythm data, this is for patients being evaluated for multiple types heart rhythms. For the first 24 hours post application, please avoid getting the Zio monitor wet in the shower or by excessive sweating during exercise. After that, feel free to carry on with regular activities. Keep soaps and lotions away from the ZIO XT Patch.  Someone will be calling you to let you know when this is mailed.  This will be mailed to you, please expect 7-10 days to receive.      Call [place] at [phone] if you have questions regarding your ZIO XT patch monitor.  Call them immediately if you see an orange light blinking on your monitor.   If your monitor falls off in less than 4 days contact our Monitor  department at [phone].  If your monitor becomes loose or falls off after 4 days call [employer] at [phone] for suggestions on securing your monitor     Follow-Up: At [facility], you and your health needs are our priority.  As part of our continuing mission to provide you with exceptional heart care, we have created designated Provider Care Teams.  These Care Teams include your primary Cardiologist (physician) and Advanced Practice Providers (APPs -  Physician Assistants and Nurse Practitioners) who all work together to provide you with the care you need, when you need it.  We recommend signing up for the patient portal called "MyChart".  Sign up information is provided on this After Visit Summary.  MyChart is used to connect with patients for Virtual Visits (Telemedicine).  Patients are able to view lab/test results, encounter notes, upcoming appointments, etc.  Non-urgent messages can be sent to your provider as well.   To learn more about what you can do with MyChart, go to [email or web address].    Your next appointment:   3 month(s)  The format for your next appointment:   In Person  Provider:   Eleonore Chiquito, MD

## 2020-07-16 LAB — IRON,TIBC AND FERRITIN PANEL
Ferritin: 826 ng/mL — ABNORMAL HIGH (ref 30–400)
Iron Saturation: 58 % — ABNORMAL HIGH (ref 15–55)
Iron: 142 ug/dL (ref 38–169)
Total Iron Binding Capacity: 246 ug/dL — ABNORMAL LOW (ref 250–450)
UIBC: 104 ug/dL — ABNORMAL LOW (ref 111–343)

## 2020-07-16 LAB — VITAMIN B12: Vitamin B-12: 309 pg/mL (ref 232–1245)

## 2020-07-17 ENCOUNTER — Ambulatory Visit (INDEPENDENT_AMBULATORY_CARE_PROVIDER_SITE_OTHER): Payer: Medicare Other

## 2020-07-17 DIAGNOSIS — R000 Tachycardia, unspecified: Secondary | ICD-10-CM | POA: Diagnosis not present

## 2020-07-18 ENCOUNTER — Other Ambulatory Visit (INDEPENDENT_AMBULATORY_CARE_PROVIDER_SITE_OTHER): Payer: Medicare Other

## 2020-07-18 DIAGNOSIS — I10 Essential (primary) hypertension: Secondary | ICD-10-CM

## 2020-07-22 ENCOUNTER — Telehealth: Payer: Self-pay | Admitting: Radiation Oncology

## 2020-07-22 NOTE — Telephone Encounter (Signed)
  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  Interval Since Last Radiation:  5 weeks   06/19/20 SRS Treatment: PTV5 Right Frontal 4 mm  PTV6 Right Frontal 5 mm Both sites were treated to 20 Gy in 1 fraction  06/10/20- 06/28/20: The tumor in the left hilar region and mediastinal nodes were treated to 35 Gy in 14 fractions.   02/12/20 SRS Treatment: PTV4 Sup Rt Parietal 53mmwas treated to a dose of 20 Gy in 1 fration using 3 DCA's. PTV3 Sup Vermis 57mmwas treated to a dose of 20 Gy in 1 fration using 3 DCA's.   11/03/19 SRS Treatment: PTV2, left Frontal 6 mm target was treated to 20 Gy in 1 fraction  09/29/2018 - 10/14/2018: Whole Brain / 30 Gy in 10 fractions  01/13/17 - 02/02/17: Left lung: 30 Gy in 15 fractions  01/25/17 SRS Treatment: PTV1Left Frontal 41mm: 20 Gy in 1 fraction  Narrative:  The patient was contacted today for routine follow-up. During treatment he did very well with radiotherapy and did not have any desquamation or significant toxicities.  Impression/Plan: 1. 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 The patient has been doing well since completion of radiotherapy based on my review of his notes from other providers. I left a message on his voicemail and discussed that we would be happy to continue to follow him as needed, but he will also continue to follow up with Dr. Mickeal Skinner in Neuro Oncology and with Dr. Julien Nordmann in medical oncology as he continues with immunotherapy.    Carola Rhine,  PAC

## 2020-07-25 ENCOUNTER — Encounter: Payer: Self-pay | Admitting: Radiation Oncology

## 2020-07-26 ENCOUNTER — Encounter: Payer: Self-pay | Admitting: Internal Medicine

## 2020-07-26 NOTE — Progress Notes (Signed)
  Radiation Oncology         [phone]) [phone] ________________________________  Name: Calvin Wagner MRN: [id number]  Date: 06/19/2020  DOB: [birth year]  End of Treatment Note  Diagnosis:   Stage IV lung cancer with brain metastasis  Indication for treatment:  palliative       Radiation treatment dates:   06/19/2020  Site/dose:    1.  20 Gy in 1 fraction ExacTrac, 3 dca beams max dose=133.1% PTV5 Rt Frontal 55mm  2.  20 Gy in 1 fraction ExacTrac. 3 dca beams Max dose=126.2% PTV6 Rt Frontal 96mm  Narrative: The patient tolerated radiation treatment well.   There were no signs of acute toxicity after treatment.  Plan: The patient has completed radiation treatment. The patient will return to radiation oncology clinic for routine followup in one month. I advised the patient to call or return sooner if they have any questions or concerns related to their recovery or treatment. ________________________________  ------------------------------------------------  Jodelle Gross, MD, PhD

## 2020-07-26 NOTE — Progress Notes (Signed)
  Radiation Oncology         [phone]) [phone] ________________________________  Name: Calvin Wagner MRN: [id number]  Date: 06/28/2020  DOB: 1966-06-02  End of Treatment Note  Diagnosis:  Stage IV lung cancer     Indication for treatment::  palliative       Radiation treatment dates:   06/10/2020 through 06/28/2020  Site/dose:    The left lung was treated to a dose of 35 Gy in 14 fractions using a 5-field 3D conformal technique.  Narrative: The patient tolerated radiation treatment relatively well.   He concurrently received radiosurgery to 2 small metastases on 06/19/2020.  Plan: The patient has completed radiation treatment. The patient will return to radiation oncology clinic for routine followup in one month. I advised the patient to call or return sooner if they have any questions or concerns related to their recovery or treatment. ________________________________  Jodelle Gross, M.D., Ph.D.

## 2020-07-29 ENCOUNTER — Telehealth: Payer: Self-pay | Admitting: Radiation Oncology

## 2020-07-30 ENCOUNTER — Ambulatory Visit (HOSPITAL_COMMUNITY): Payer: Medicare Other | Attending: Cardiovascular Disease

## 2020-07-30 ENCOUNTER — Other Ambulatory Visit: Payer: Self-pay

## 2020-07-30 DIAGNOSIS — R000 Tachycardia, unspecified: Secondary | ICD-10-CM | POA: Diagnosis not present

## 2020-07-30 DIAGNOSIS — R9431 Abnormal electrocardiogram [ECG] [EKG]: Secondary | ICD-10-CM | POA: Diagnosis not present

## 2020-07-30 LAB — ECHOCARDIOGRAM COMPLETE
Area-P 1/2: 5.46 cm2
S' Lateral: 2.8 cm

## 2020-08-02 ENCOUNTER — Ambulatory Visit (HOSPITAL_COMMUNITY)
Admission: RE | Admit: 2020-08-02 | Discharge: 2020-08-02 | Disposition: A | Discharge status: Home / Self Care | Payer: Medicare Other | Source: Ambulatory Visit | Attending: Internal Medicine | Admitting: Internal Medicine

## 2020-08-02 ENCOUNTER — Other Ambulatory Visit: Payer: Self-pay

## 2020-08-02 ENCOUNTER — Telehealth: Payer: Self-pay | Admitting: Cardiovascular Disease

## 2020-08-02 ENCOUNTER — Encounter (HOSPITAL_COMMUNITY): Payer: Self-pay

## 2020-08-02 DIAGNOSIS — C3490 Malignant neoplasm of unspecified part of unspecified bronchus or lung: Secondary | ICD-10-CM | POA: Diagnosis present

## 2020-08-02 DIAGNOSIS — I480 Paroxysmal atrial fibrillation: Secondary | ICD-10-CM

## 2020-08-02 DIAGNOSIS — I4891 Unspecified atrial fibrillation: Secondary | ICD-10-CM | POA: Insufficient documentation

## 2020-08-02 DIAGNOSIS — I77810 Thoracic aortic ectasia: Secondary | ICD-10-CM

## 2020-08-02 MED ORDER — IOHEXOL 300 MG/ML  SOLN
100.0000 mL | Freq: Once | INTRAMUSCULAR | Status: AC | PRN
  Administered 2020-08-02: 100 mL via INTRAVENOUS

## 2020-08-02 MED ORDER — METOPROLOL SUCCINATE ER 25 MG PO TB24: 25.0000 mg | ORAL_TABLET | Freq: Every day | ORAL | 3 refills | Status: DC

## 2020-08-02 NOTE — Telephone Encounter (Signed)
Called and discussed monitor with Mr. Lortie wife. Brief Afib <1%. CHADSVASC =1. AC not indicated but he is on xarelto for DVT/PE. He will continue this. We will start metoprolol succinate 25 mg daily. Instructed to get Kardia mobile device to monitor Afib.   Discussed aortic root 44 mm. Will need gated MRA. We will order this and get completed in next few weeks.   We will keep close eye on BP while starting metoprolol. Wife in agreement.   [place] T. Audie Box, [place]  [address] [place], [place] [address] [phone]  11:30 AM

## 2020-08-02 NOTE — Telephone Encounter (Signed)
Prescription sent to pharmacy.  Order placed for MRA.

## 2020-08-02 NOTE — Addendum Note (Signed)
Addended by: Patria Mane A on: 08/02/2020 12:17 PM   Modules accepted: Orders

## 2020-08-05 ENCOUNTER — Telehealth: Payer: Self-pay | Admitting: Cardiovascular Disease

## 2020-08-05 NOTE — Telephone Encounter (Signed)
Spoke with patient to schedule MRA Chest ordered by Dr. Dorcas Carrow states he will check with his wife and call back to schedule

## 2020-08-06 ENCOUNTER — Other Ambulatory Visit: Payer: Self-pay

## 2020-08-06 ENCOUNTER — Inpatient Hospital Stay: Payer: Medicare Other | Attending: Internal Medicine | Admitting: Internal Medicine

## 2020-08-06 ENCOUNTER — Inpatient Hospital Stay: Payer: Medicare Other

## 2020-08-06 ENCOUNTER — Encounter: Payer: Self-pay | Admitting: Internal Medicine

## 2020-08-06 VITALS — BP 133/81 | HR 102 | Temp 97.8°F | Resp 20 | Ht 73.0 in | Wt 246.7 lb

## 2020-08-06 DIAGNOSIS — C7B8 Other secondary neuroendocrine tumors: Secondary | ICD-10-CM | POA: Diagnosis not present

## 2020-08-06 DIAGNOSIS — C3432 Malignant neoplasm of lower lobe, left bronchus or lung: Secondary | ICD-10-CM | POA: Diagnosis not present

## 2020-08-06 DIAGNOSIS — C7A1 Malignant poorly differentiated neuroendocrine tumors: Secondary | ICD-10-CM | POA: Insufficient documentation

## 2020-08-06 DIAGNOSIS — C7931 Secondary malignant neoplasm of brain: Secondary | ICD-10-CM

## 2020-08-06 DIAGNOSIS — Z5111 Encounter for antineoplastic chemotherapy: Secondary | ICD-10-CM

## 2020-08-06 DIAGNOSIS — Z79899 Other long term (current) drug therapy: Secondary | ICD-10-CM | POA: Insufficient documentation

## 2020-08-06 DIAGNOSIS — C3492 Malignant neoplasm of unspecified part of left bronchus or lung: Secondary | ICD-10-CM

## 2020-08-06 DIAGNOSIS — Z5112 Encounter for antineoplastic immunotherapy: Secondary | ICD-10-CM

## 2020-08-06 DIAGNOSIS — R5383 Other fatigue: Secondary | ICD-10-CM

## 2020-08-06 DIAGNOSIS — C3490 Malignant neoplasm of unspecified part of unspecified bronchus or lung: Secondary | ICD-10-CM

## 2020-08-06 LAB — TSH: TSH: 2.983 u[IU]/mL (ref 0.320–4.118)

## 2020-08-06 LAB — CMP (CANCER CENTER ONLY)
ALT: 16 U/L (ref 0–44)
AST: 14 U/L — ABNORMAL LOW (ref 15–41)
Albumin: 3.3 g/dL — ABNORMAL LOW (ref 3.5–5.0)
Alkaline Phosphatase: 100 U/L (ref 38–126)
Anion gap: 9 (ref 5–15)
BUN: 5 mg/dL — ABNORMAL LOW (ref 6–20)
CO2: 24 mmol/L (ref 22–32)
Calcium: 9.4 mg/dL (ref 8.9–10.3)
Chloride: 103 mmol/L (ref 98–111)
Creatinine: 1.19 mg/dL (ref 0.61–1.24)
GFR, Estimated: >60 mL/min (ref >60)
Glucose, Bld: 151 mg/dL — ABNORMAL HIGH (ref 70–99)
Potassium: 3.7 mmol/L (ref 3.5–5.1)
Sodium: 136 mmol/L (ref 135–145)
Total Bilirubin: 0.7 mg/dL (ref 0.3–1.2)
Total Protein: 6.7 g/dL (ref 6.5–8.1)

## 2020-08-06 LAB — CBC WITH DIFFERENTIAL (CANCER CENTER ONLY)
Abs Immature Granulocytes: 0.02 10*3/uL (ref 0.00–0.07)
Basophils Absolute: 0 10*3/uL (ref 0.0–0.1)
Basophils Relative: 1 %
Eosinophils Absolute: 0.1 10*3/uL (ref 0.0–0.5)
Eosinophils Relative: 2 %
HCT: 42.8 % (ref 39.0–52.0)
Hemoglobin: 14.4 g/dL (ref 13.0–17.0)
Immature Granulocytes: 0 %
Lymphocytes Relative: 8 %
Lymphs Abs: 0.5 10*3/uL — ABNORMAL LOW (ref 0.7–4.0)
MCH: 32.4 pg (ref 26.0–34.0)
MCHC: 33.6 g/dL (ref 30.0–36.0)
MCV: 96.2 fL (ref 80.0–100.0)
Monocytes Absolute: 0.5 10*3/uL (ref 0.1–1.0)
Monocytes Relative: 10 %
Neutro Abs: 4.4 10*3/uL (ref 1.7–7.7)
Neutrophils Relative %: 79 %
Platelet Count: 300 10*3/uL (ref 150–400)
RBC: 4.45 MIL/uL (ref 4.22–5.81)
RDW: 16 % — ABNORMAL HIGH (ref 11.5–15.5)
WBC Count: 5.5 10*3/uL (ref 4.0–10.5)
nRBC: 0 % (ref 0.0–0.2)

## 2020-08-06 NOTE — Progress Notes (Signed)
[place] [phone]   [phone]  OFFICE PROGRESS NOTE  [name], MD 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  PRIOR THERAPY:  1) Stereotactic radiotherapy to a solitary brain metastasis under the care of Dr. Lisbeth Renshaw on 01/25/2017. 2) Short course of concurrent chemoradiation with weekly carboplatin for AUC of 2 and paclitaxel 45 MG/M2 to the locally advanced disease in the chest. Status post 3 cycles. 3) Systemic chemotherapy with carboplatin for AUC of 5, Alimta 500 MG/M2 and Avastin 15 MG/KG every 3 weeks. First dose of 02/15/2017. Status post 6 cycles. Last dose 06/08/2017 with stable disease. 4) whole brain irradiation under the care of Dr. Lisbeth Renshaw expected to complete on October 14, 2018. 5) Maintenance systemic chemotherapy with Alimta 500 MG/M2 in addition to Avastin 15 MG/KG every 3 weeks. First dose 07/13/2017.  Status post 20 cycles.  Starting from cycle #16 he will be treated with single agent Alimta only secondary to intolerance. 6) short course of palliative radiotherapy to the enlarging left hilar and mediastinal lymph nodes under the care of Dr. Lisbeth Renshaw. 7)  Second line treatment with immunotherapy with nivolumab 480 mg IV every 4 weeks.  First dose October 26, 2018.  Status post 23 cycles.  Last dose was given 07/09/2020 discontinued secondary to disease progression.    CURRENT THERAPY: None.  INTERVAL HISTORY: Calvin Wagner 54 y.o. male returns to the clinic today for follow-up visit.  The patient is feeling fine today with no concerning complaints except for mild fatigue and nausea.  He was seen by cardiology for  evaluation of his tachycardia.  He denied having any current chest pain, shortness of breath, cough or hemoptysis.  He denied having any fever or chills.  He has no vomiting, diarrhea or constipation.  He has no headache or visual changes.  He lost few pounds since his last visit.  The patient was tolerating his previous treatment with nivolumab fairly well.  He had repeat CT scan of the chest, abdomen pelvis performed recently and is here for evaluation and discussion of his scan results.   MEDICAL HISTORY: Past Medical History:  Diagnosis Date  . Encounter for antineoplastic chemotherapy 12/31/2016  . GERD 11/08/2009   Qualifier: Diagnosis of  By: Ronnald Ramp MD, Arvid Right Goals of care, counseling/discussion 12/31/2016  . History of radiation therapy 01/13/2017 - 02/02/2017   Site/dose:  Left lung: 30 Gy in 15 fractions  . HYPERTENSION 11/08/2009   Qualifier: Diagnosis of  By: Ronnald Ramp MD, Arvid Right.   . Large cell carcinoma of left lung, stage 4 ([facility]) 12/31/2016  . Migraine 02/25/2012  . Pneumonia   . PONV (postoperative nausea and vomiting)   . Rotator cuff tear, left     ALLERGIES:  has No Known Allergies.  MEDICATIONS:  Current Outpatient Medications  Medication Sig Dispense Refill  . amLODipine (NORVASC) 5 MG tablet Take 5 mg by mouth daily.    . chlorpheniramine-HYDROcodone (TUSSIONEX) 10-8 MG/5ML SUER Take 5 mLs by mouth every 12 (twelve) hours as needed for cough. 240 mL 0  . dexamethasone (DECADRON) 2 MG tablet Take 1 tablet (2 mg total) by mouth daily.    . famotidine (  PEPCID) 20 MG tablet Take 20 mg by mouth 2 (two) times daily.    . folic acid (FOLVITE) 1 MG tablet Take 1 tablet (1 mg total) by mouth daily. 30 tablet 4  . levETIRAcetam (KEPPRA) 750 MG tablet TAKE 1 TABLET(750 MG) BY MOUTH TWICE DAILY 60 tablet 3  . LORazepam (ATIVAN) 1 MG tablet TAKE ONE TAB BY MOUTH 30 MINUTES PRIOR TO MRI OR RADIOTHERAPY. 30 tablet 0  . losartan (COZAAR) 50 MG tablet Take 50 mg by mouth daily.     . magic mouthwash SOLN Take 5 mLs by mouth 4 (four) times daily as needed for mouth pain. 1:1:1 mix-Nystatin. Benadryl and extra strength maalox. 240 mL 0  . metoprolol succinate (TOPROL XL) 25 MG 24 hr tablet Take 1 tablet (25 mg total) by mouth daily. 90 tablet 3  . Multiple Vitamin (MULTIVITAMIN WITH MINERALS) TABS tablet Take 1 tablet by mouth daily.    . ondansetron (ZOFRAN) 8 MG tablet TAKE 1 TABLET BY MOUTH EVERY 8 HOURS AS NEEDED FOR NAUSEA OR VOMITING. 90 tablet 0  . PROAIR HFA 108 (90 Base) MCG/ACT inhaler Inhale 2 puffs into the lungs every 4 (four) hours as needed. 1 each 5  . prochlorperazine (COMPAZINE) 10 MG tablet TAKE 1 TABLET BY MOUTH EVERY 6 HOURS AS NEEDED FOR NAUSEA OR VOMITING 90 tablet 0  . sucralfate (CARAFATE) 1 g tablet Crush and mix in 1 oz water and drink 5 min TID before meals and at HS for radiation induced esophagitis 120 tablet 2  . XARELTO 20 MG TABS tablet TAKE 1 TABLET(20 MG) BY MOUTH DAILY WITH SUPPER 30 tablet 2   No current facility-administered medications for this visit.    SURGICAL HISTORY:  Past Surgical History:  Procedure Laterality Date  . INGUINAL HERNIA REPAIR    . SHOULDER ARTHROSCOPY WITH ROTATOR CUFF REPAIR Left 11/12/2016   Procedure: SHOULDER ARTHROSCOPY WITH ROTATOR CUFF REPAIR AND SUBACROMIAL DECOMPRESSION;  Surgeon: Tania Ade, MD;  Location: [facility];  Service: Orthopedics;  Laterality: Left;  SHOULDER ARTHROSCOPY WITH ROTATOR CUFF REPAIR AND SUBACROMIAL DECOMPRESSION  . TONSILLECTOMY    . TOTAL KNEE ARTHROPLASTY    . VIDEO BRONCHOSCOPY Bilateral 12/10/2016   Procedure: VIDEO BRONCHOSCOPY WITHOUT FLUORO;  Surgeon: Tanda Rockers, MD;  Location: [facility];  Service: Cardiopulmonary;  Laterality: Bilateral;    REVIEW OF SYSTEMS:  Constitutional: positive for fatigue and weight loss Eyes: negative Ears, nose, mouth, throat, and face: negative Respiratory: negative Cardiovascular: positive for irregular heart beat Gastrointestinal:  positive for nausea Genitourinary:negative Integument/breast: negative Hematologic/lymphatic: negative Musculoskeletal:negative Neurological: negative Behavioral/Psych: negative Endocrine: negative Allergic/Immunologic: negative   PHYSICAL EXAMINATION: General appearance: alert, cooperative, fatigued and no distress Head: Normocephalic, without obvious abnormality, atraumatic Neck: no adenopathy, no JVD, supple, symmetrical, trachea midline and thyroid not enlarged, symmetric, no tenderness/mass/nodules Lymph nodes: Cervical, supraclavicular, and axillary nodes normal. Resp: clear to auscultation bilaterally Back: symmetric, no curvature. ROM normal. No CVA tenderness. Cardio: regular rate and rhythm, S1, S2 normal, no murmur, click, rub or gallop GI: soft, non-tender; bowel sounds normal; no masses,  no organomegaly Extremities: extremities normal, atraumatic, no cyanosis or edema Neurologic: Alert and oriented X 3, normal strength and tone. Normal symmetric reflexes. Normal coordination and gait  ECOG PERFORMANCE STATUS: 1 - Symptomatic but completely ambulatory  Blood pressure 133/81, pulse (!) 102, temperature 97.8 F (36.6 C), temperature source Tympanic, resp. rate 20, height 6\' 1"  (1.854 m), weight 246 lb 11.2 oz (111.9 kg), SpO2 96 %.  LABORATORY  DATA: Lab Results  Component Value Date   WBC 5.5 08/06/2020   HGB 14.4 08/06/2020   HCT 42.8 08/06/2020   MCV 96.2 08/06/2020   PLT 300 08/06/2020      Chemistry      Component Value Date/Time   NA 135 07/09/2020 0931   NA 138 10/08/2017 0907   K 4.0 07/09/2020 0931   K 4.1 10/08/2017 0907   CL 100 07/09/2020 0931   CO2 26 07/09/2020 0931   CO2 24 10/08/2017 0907   BUN 8 07/09/2020 0931   BUN 11.7 10/08/2017 0907   CREATININE 1.18 07/09/2020 0931   CREATININE 1.2 10/08/2017 0907      Component Value Date/Time   CALCIUM 9.1 07/09/2020 0931   CALCIUM 9.5 10/08/2017 0907   ALKPHOS 88 07/09/2020 0931   ALKPHOS 88  10/08/2017 0907   AST 14 (L) 07/09/2020 0931   AST 22 10/08/2017 0907   ALT 27 07/09/2020 0931   ALT 24 10/08/2017 0907   BILITOT 0.8 07/09/2020 0931   BILITOT 0.42 10/08/2017 0907       RADIOGRAPHIC STUDIES: CT Chest W Contrast  Result Date: 08/03/2020 CLINICAL DATA:  Metastatic non-small cell lung cancer.  Restaging. EXAM: CT CHEST, ABDOMEN, AND PELVIS WITH CONTRAST TECHNIQUE: Multidetector CT imaging of the chest, abdomen and pelvis was performed following the standard protocol during bolus administration of intravenous contrast. CONTRAST:  146mL OMNIPAQUE IOHEXOL 300 MG/ML  SOLN COMPARISON:  05/14/2020. FINDINGS: CT CHEST FINDINGS Cardiovascular: Normal heart size. Trace pericardial effusion is new from previous exam. Mediastinum/Nodes: Normal appearance of the thyroid gland. The trachea appears patent and is midline. Normal appearance of the esophagus. Interval increase and previously noted left pre-vascular adenopathy. Index lymph node measures 0.9 cm, image 29/2. Previously 0.5 cm. Adjacent lymph node measures 0.8 cm, image 29/2. Previously 0.3 cm. The left hilar mass has increased in size from previous exam with progressive postobstructive consolidation and atelectasis of the left lower lobe. On today's study this has a cranial caudal dimension of 3.6 cm, image 75/4. Previously 2.6 cm. Lungs/Pleura: Increase in volume of left pleural effusion and postobstructive consolidation and atelectasis of the left lower lobe. New patchy areas of ground-glass and airspace consolidation identified within the left upper lobe, lingula and superior segment of left lower lobe. Findings are compatible with progressive postobstructive pneumonitis. Musculoskeletal: No chest wall mass or suspicious bone lesions identified. CT ABDOMEN PELVIS FINDINGS Hepatobiliary: No change in subtle low-attenuation structure within posterior right hepatic lobe measuring 1.3 cm, image 68/2. This remains indeterminate. Focal fatty  infiltration is identified adjacent to the falciform ligament, image 64/2. No new liver abnormalities. The gallbladder is unremarkable. No biliary dilatation. Pancreas: Unremarkable. No pancreatic ductal dilatation or surrounding inflammatory changes. Spleen: Normal in size without focal abnormality. Adrenals/Urinary Tract: Enhancing nodule in the left adrenal gland measures 2.6 x 1.9 by 2.8 cm. Previously this measured 2.3 by 1.9 by 2.8 cm. No suspicious kidney mass or hydronephrosis. Small exophytic lesion arising from the lateral cortex of the inferior pole of left kidney is too small to characterize. Urinary bladder is unremarkable. Stomach/Bowel: Stomach is within normal limits. Appendix appears normal. No evidence of bowel wall thickening, distention, or inflammatory changes. Vascular/Lymphatic: No significant vascular findings are present. No enlarged abdominal or pelvic lymph nodes. Reproductive: Prostate is unremarkable. Other: Signs of previous lower ventral abdominal wall herniorrhaphy. No free fluid or fluid collections. No signs of peritoneal disease. Musculoskeletal: No acute or significant osseous findings. IMPRESSION: 1. Interval progression of  disease. Increase in size of left hilar mass with progressive postobstructive changes involving the left upper lobe, lingula and left lower lobe. 2. Interval increase in volume of left pleural effusion. 3. Increase in size of left prevascular lymph nodes concerning for progressive metastatic adenopathy. 4. No significant change in size of left adrenal gland metastasis. 5. Stable, subtle indeterminate low-attenuation structure within posterior right hepatic lobe. Attention on follow-up imaging is advised. Electronically Signed   By: Kerby Moors M.D.   On: 08/03/2020 10:43   CT Abdomen Pelvis W Contrast  Result Date: 08/03/2020 CLINICAL DATA:  Metastatic non-small cell lung cancer.  Restaging. EXAM: CT CHEST, ABDOMEN, AND PELVIS WITH CONTRAST TECHNIQUE:  Multidetector CT imaging of the chest, abdomen and pelvis was performed following the standard protocol during bolus administration of intravenous contrast. CONTRAST:  18mL OMNIPAQUE IOHEXOL 300 MG/ML  SOLN COMPARISON:  05/14/2020. FINDINGS: CT CHEST FINDINGS Cardiovascular: Normal heart size. Trace pericardial effusion is new from previous exam. Mediastinum/Nodes: Normal appearance of the thyroid gland. The trachea appears patent and is midline. Normal appearance of the esophagus. Interval increase and previously noted left pre-vascular adenopathy. Index lymph node measures 0.9 cm, image 29/2. Previously 0.5 cm. Adjacent lymph node measures 0.8 cm, image 29/2. Previously 0.3 cm. The left hilar mass has increased in size from previous exam with progressive postobstructive consolidation and atelectasis of the left lower lobe. On today's study this has a cranial caudal dimension of 3.6 cm, image 75/4. Previously 2.6 cm. Lungs/Pleura: Increase in volume of left pleural effusion and postobstructive consolidation and atelectasis of the left lower lobe. New patchy areas of ground-glass and airspace consolidation identified within the left upper lobe, lingula and superior segment of left lower lobe. Findings are compatible with progressive postobstructive pneumonitis. Musculoskeletal: No chest wall mass or suspicious bone lesions identified. CT ABDOMEN PELVIS FINDINGS Hepatobiliary: No change in subtle low-attenuation structure within posterior right hepatic lobe measuring 1.3 cm, image 68/2. This remains indeterminate. Focal fatty infiltration is identified adjacent to the falciform ligament, image 64/2. No new liver abnormalities. The gallbladder is unremarkable. No biliary dilatation. Pancreas: Unremarkable. No pancreatic ductal dilatation or surrounding inflammatory changes. Spleen: Normal in size without focal abnormality. Adrenals/Urinary Tract: Enhancing nodule in the left adrenal gland measures 2.6 x 1.9 by 2.8  cm. Previously this measured 2.3 by 1.9 by 2.8 cm. No suspicious kidney mass or hydronephrosis. Small exophytic lesion arising from the lateral cortex of the inferior pole of left kidney is too small to characterize. Urinary bladder is unremarkable. Stomach/Bowel: Stomach is within normal limits. Appendix appears normal. No evidence of bowel wall thickening, distention, or inflammatory changes. Vascular/Lymphatic: No significant vascular findings are present. No enlarged abdominal or pelvic lymph nodes. Reproductive: Prostate is unremarkable. Other: Signs of previous lower ventral abdominal wall herniorrhaphy. No free fluid or fluid collections. No signs of peritoneal disease. Musculoskeletal: No acute or significant osseous findings. IMPRESSION: 1. Interval progression of disease. Increase in size of left hilar mass with progressive postobstructive changes involving the left upper lobe, lingula and left lower lobe. 2. Interval increase in volume of left pleural effusion. 3. Increase in size of left prevascular lymph nodes concerning for progressive metastatic adenopathy. 4. No significant change in size of left adrenal gland metastasis. 5. Stable, subtle indeterminate low-attenuation structure within posterior right hepatic lobe. Attention on follow-up imaging is advised. Electronically Signed   By: Kerby Moors M.D.   On: 08/03/2020 10:43   ECHOCARDIOGRAM COMPLETE  Result Date: 07/30/2020    ECHOCARDIOGRAM  REPORT   Patient Name:   Calvin GASSMANN Date of Exam: 07/30/2020 Medical Rec #:  [id number]      Height:       73.0 in Accession #:    [id number]     Weight:       248.6 lb Date of Birth:  [birth year]      BSA:          2.360 m Patient Age:    24 years       BP:           124/84 mmHg Patient Gender: M              HR:           96 bpm. Exam Location:  [facility] Procedure: 2D Echo, 3D Echo, Cardiac Doppler, Color Doppler and Strain Analysis Indications:    R00.0 Tachycardia  History:        Patient has  prior history of Echocardiogram examinations, most                 recent 07/22/2014. Abnormal ECG; Risk Factors:Family History of                 Coronary Artery Disease and Hypertension. Left Lung Cancer with                 Chemo and Radiation.  Sonographer:    Deliah Boston RDCS Referring Phys: [id number] [place]  1. Left ventricular ejection fraction, by estimation, is 60 to 65%. The left ventricle has normal function. The left ventricle has no regional wall motion abnormalities. Left ventricular diastolic parameters are consistent with Grade I diastolic dysfunction (impaired relaxation). The average left ventricular global longitudinal strain is 16.1 %. The global longitudinal strain is normal.  2. Right ventricular systolic function is normal. The right ventricular size is normal.  3. The mitral valve is normal in structure. No evidence of mitral valve regurgitation. No evidence of mitral stenosis.  4. The aortic valve is tricuspid. Aortic valve regurgitation is not visualized. No aortic stenosis is present.  5. Aortic dilatation noted. There is mild dilatation at the level of the sinuses of Valsalva, measuring 44 mm.  6. The inferior vena cava is normal in size with greater than 50% respiratory variability, suggesting right atrial pressure of 3 mmHg. FINDINGS  Left Ventricle: Left ventricular ejection fraction, by estimation, is 60 to 65%. The left ventricle has normal function. The left ventricle has no regional wall motion abnormalities. The average left ventricular global longitudinal strain is 16.1 %. The  global longitudinal strain is normal. The left ventricular internal cavity size was normal in size. There is no left ventricular hypertrophy. Left ventricular diastolic parameters are consistent with Grade I diastolic dysfunction (impaired relaxation). Indeterminate filling pressures. Right Ventricle: The right ventricular size is normal. No increase in right ventricular wall  thickness. Right ventricular systolic function is normal. Left Atrium: Left atrial size was normal in size. Right Atrium: Right atrial size was normal in size. Pericardium: There is no evidence of pericardial effusion. Mitral Valve: The mitral valve is normal in structure. No evidence of mitral valve regurgitation. No evidence of mitral valve stenosis. Tricuspid Valve: The tricuspid valve is normal in structure. Tricuspid valve regurgitation is not demonstrated. No evidence of tricuspid stenosis. Aortic Valve: The aortic valve is tricuspid. Aortic valve regurgitation is not visualized. No aortic stenosis is present. Pulmonic Valve: The pulmonic valve was normal in structure. Pulmonic valve  regurgitation is trivial. No evidence of pulmonic stenosis. Aorta: Aortic dilatation noted. There is mild dilatation at the level of the sinuses of Valsalva, measuring 44 mm. Venous: The inferior vena cava is normal in size with greater than 50% respiratory variability, suggesting right atrial pressure of 3 mmHg. IAS/Shunts: No atrial level shunt detected by color flow Doppler.  LEFT VENTRICLE PLAX 2D LVIDd:         4.40 cm  Diastology LVIDs:         2.80 cm  LV e' medial:    7.83 cm/s LV PW:         1.00 cm  LV E/e' medial:  9.6 LV IVS:        1.00 cm  LV e' lateral:   9.14 cm/s LVOT diam:     2.30 cm  LV E/e' lateral: 8.2 LV SV:         72 LV SV Index:   30       2D Longitudinal Strain LVOT Area:     4.15 cm 2D Strain GLS (A2C):   15.6 %                         2D Strain GLS (A3C):   16.1 %                         2D Strain GLS (A4C):   16.8 %                         2D Strain GLS Avg:     16.1 %                          3D Volume EF:                         3D EF:        64 %                         LV EDV:       151 ml                         LV ESV:       54 ml                         LV SV:        96 ml RIGHT VENTRICLE RV S prime:     16.80 cm/s TAPSE (M-mode): 1.9 cm LEFT ATRIUM             Index       RIGHT ATRIUM           Index LA diam:        3.30 cm 1.40 cm/m  RA Area:     8.94 cm LA Vol (A2C):   54.7 ml 23.18 ml/m RA Volume:   12.80 ml 5.42 ml/m LA Vol (A4C):   42.4 ml 17.97 ml/m LA Biplane Vol: 49.4 ml 20.93 ml/m  AORTIC VALVE LVOT Vmax:   104.00 cm/s LVOT Vmean:  69.400 cm/s LVOT VTI:    0.173 m  AORTA Ao Root diam: 4.40 cm Ao Asc diam:  3.40 cm MITRAL VALVE MV Area (PHT): cm  SHUNTS MV Decel Time: 139 msec    Systemic VTI:  0.17 m MV E velocity: 75.00 cm/s  Systemic Diam: 2.30 cm MV A velocity: 90.80 cm/s MV E/A ratio:  0.83 Skeet Latch MD Electronically signed by Skeet Latch MD Signature Date/Time: 07/30/2020/6:07:20 PM    Final    LONG TERM MONITOR (3-14 DAYS)  Result Date: 08/02/2020 Enrollment 07/17/2020-07/24/2020 (6 days 16 hours). Patient had a min HR of 65 bpm (normal sinus rhythm), max HR of 187 bpm (atrial fibrillation), and avg HR of 96 bpm (normal sinus rhythm). Predominant underlying rhythm was Sinus Rhythm. First Degree AV Block was present. 8 Atrial Fibrillation/Flutter episodes occurred (<1% burden), ranging from 77-187 bpm (avg of 108 bpm), the longest lasting 1 hour 5 mins with an avg rate of 109 bpm. Isolated SVEs were frequent (8.6%, 40086), SVE Couplets were rare (<1.0%, 1205), and SVE Triplets were rare (<1.0%, 808). Isolated VEs were rare (<1.0%), and no VE Couplets or VE Triplets were present. Diary summarized below: 07/18/20 12:37pm skipped/irregular beat(s), lightheaded coincided with sinus tachycardia 104 bpm. Impression: 1. Paroxysmal atrial fibrillation detected (8 episodes; <1% burden; longest episode 1 hour; 5 min). 2. Frequent PACs (8.6% burden). [place] T. Audie Box, [place] [address] [place], [place] [address] [phone] 11:09 AM    ASSESSMENT AND PLAN: This is a very pleasant 54 years old white male with stage IV non-small cell lung cancer, high-grade neuroendocrine carcinoma presented with locally advanced disease in the  chest as well as solitary brain metastasis and 2 liver lesions. The patient is status post stereotactic radiotherapy to the solitary brain metastasis as well as short course of concurrent chemoradiation to the locally advanced disease in the chest. He recently completed 6 cycles of systemic chemotherapy with carboplatin, Alimta and Avastin and tolerated this treatment fairly well. He was started on maintenance treatment with Alimta and Avastin status post 15 cycles.  Avastin was discontinued secondary to intolerance to the combination treatment.  He is currently on treatment with single agent Alimta status post 5 cycles. He has been tolerating his treatment well. Unfortunately he was recently found to have multiple metastatic brain lesion and the patient underwent whole brain irradiation. His scan showed evidence for disease progression with enlargement of left lower lobe lung nodule in addition to hilar lymphadenopathy as well as progression of the liver lesion. He was started on treatment with nivolumab 480 mg IV every 4 weeks status post 23 cycles.  The patient has been tolerating this treatment well. He had repeat CT scan of the chest, abdomen pelvis performed recently.  I personally and independently reviewed the scans and discussed the results with the patient today. Unfortunately his scan showed evidence for disease progression. I had a lengthy discussion with the patient today about his current disease condition and treatment options. I discussed with the patient treatment with third line chemotherapy with docetaxel and Cyramza versus palliative care versus retesting again for molecular studies.  He has a guardant test done more than a year ago that was negative for any actionable mutation. The patient is interested in proceeding with the ultrasound-guided left thoracentesis to obtain more tissue for molecular studies. I will also refer him to Dr. Mindi Junker at [facility] for second  opinion.  He was seen by him in the past. I will arrange for the patient to come back for follow-up visit in 3 weeks for reevaluation and more discussion of his treatment options based on  the molecular studies and evaluation by Dr. Mindi Junker. He was advised to call immediately if he has any concerning symptoms in the interval. The patient voices understanding of current disease status and treatment options and is in agreement with the current care plan. All questions were answered. The patient knows to call the clinic with any problems, questions or concerns. We can certainly see the patient much sooner if necessary.  Disclaimer: This note was dictated with voice recognition software. Similar sounding words can inadvertently be transcribed and may not be corrected upon review.

## 2020-08-07 ENCOUNTER — Telehealth: Payer: Self-pay | Admitting: Medical Oncology

## 2020-08-07 ENCOUNTER — Telehealth: Payer: Self-pay | Admitting: Internal Medicine

## 2020-08-07 NOTE — Op Note (Signed)
  Name: Calvin Wagner  MRN: [id number]  Date: 06/19/2020   DOB: November 22, 1965  Stereotactic Radiosurgery Operative Note  PRE-OPERATIVE DIAGNOSIS:  Multiple Brain Metastases  POST-OPERATIVE DIAGNOSIS:  Multiple Brain Metastases  PROCEDURE:  Stereotactic Radiosurgery  SURGEON:  Jairo Ben, MD  NARRATIVE: The patient underwent a radiation treatment planning session in the radiation oncology simulation suite under the care of the radiation oncology physician and physicist.  I participated closely in the radiation treatment planning afterwards. The patient underwent planning CT which was fused to 3T high resolution MRI with 1 mm axial slices.  These images were fused on the planning system.  We contoured the gross target volumes and subsequently expanded this to yield the Planning Target Volume. I actively participated in the planning process.  I helped to define and review the target contours and also the contours of the optic pathway, eyes, brainstem and selected nearby organs at risk.  All the dose constraints for critical structures were reviewed and compared to AAPM Task Group 101.  The prescription dose conformity was reviewed.  I approved the plan electronically.    Accordingly, Calvin Wagner was brought to the TrueBeam stereotactic radiation treatment linac and placed in the custom immobilization mask.  The patient was aligned according to the IR fiducial markers with BrainLab Exactrac, then orthogonal x-rays were used in ExacTrac with the 6DOF robotic table and the shifts were made to align the patient  Calvin Wagner received stereotactic radiosurgery uneventfully.    Lesions treated:  2   Complex lesions treated:  0 (>3.5 cm, <33mm of optic path, or within the brainstem)   The detailed description of the procedure is recorded in the radiation oncology procedure note.  I was present for the duration of the procedure.  DISPOSITION:  Following delivery, the patient was transported to  nursing in stable condition and monitored for possible acute effects to be discharged to home in stable condition with follow-up in one month.  Jairo Ben, MD 08/07/2020 9:41 AM

## 2020-08-07 NOTE — Telephone Encounter (Signed)
Scheduled per los. Called and left msg. Mailed printout  °

## 2020-08-07 NOTE — Telephone Encounter (Signed)
Updated Baker Janus on options given to pt.

## 2020-08-07 NOTE — Op Note (Signed)
  Name: Calvin Wagner  MRN: [id number]  Date: 02/16/2020   DOB: 03-Dec-1965  Stereotactic Radiosurgery Operative Note  PRE-OPERATIVE DIAGNOSIS:  Multiple Brain Metastases  POST-OPERATIVE DIAGNOSIS:  Multiple Brain Metastases  PROCEDURE:  Stereotactic Radiosurgery  SURGEON:  Jairo Ben, MD  NARRATIVE: The patient underwent a radiation treatment planning session in the radiation oncology simulation suite under the care of the radiation oncology physician and physicist.  I participated closely in the radiation treatment planning afterwards. The patient underwent planning CT which was fused to 3T high resolution MRI with 1 mm axial slices.  These images were fused on the planning system.  We contoured the gross target volumes and subsequently expanded this to yield the Planning Target Volume. I actively participated in the planning process.  I helped to define and review the target contours and also the contours of the optic pathway, eyes, brainstem and selected nearby organs at risk.  All the dose constraints for critical structures were reviewed and compared to AAPM Task Group 101.  The prescription dose conformity was reviewed.  I approved the plan electronically.    Accordingly, Calvin Wagner was brought to the TrueBeam stereotactic radiation treatment linac and placed in the custom immobilization mask.  The patient was aligned according to the IR fiducial markers with BrainLab Exactrac, then orthogonal x-rays were used in ExacTrac with the 6DOF robotic table and the shifts were made to align the patient  Calvin Wagner received stereotactic radiosurgery uneventfully.    Lesions treated:  2  20 Gy superior vermis 20 Gy Right parietal  Complex lesions treated:  0 (>3.5 cm, <76mm of optic path, or within the brainstem)   The detailed description of the procedure is recorded in the radiation oncology procedure note.  I was present for the duration of the procedure.  DISPOSITION:   Following delivery, the patient was transported to nursing in stable condition and monitored for possible acute effects to be discharged to home in stable condition with follow-up in one month.  Jairo Ben, MD 08/07/2020 9:38 AM

## 2020-08-12 ENCOUNTER — Ambulatory Visit (HOSPITAL_COMMUNITY)
Admission: RE | Admit: 2020-08-12 | Discharge: 2020-08-12 | Disposition: A | Discharge status: Home / Self Care | Payer: Medicare Other | Source: Ambulatory Visit | Attending: Internal Medicine | Admitting: Internal Medicine

## 2020-08-12 DIAGNOSIS — Z01818 Encounter for other preprocedural examination: Secondary | ICD-10-CM | POA: Insufficient documentation

## 2020-08-12 DIAGNOSIS — Z20822 Contact with and (suspected) exposure to covid-19: Secondary | ICD-10-CM | POA: Insufficient documentation

## 2020-08-12 LAB — SARS CORONAVIRUS 2 (TAT 6-24 HRS): SARS Coronavirus 2: NEGATIVE

## 2020-08-13 ENCOUNTER — Telehealth: Payer: Self-pay | Admitting: Medical Oncology

## 2020-08-13 ENCOUNTER — Telehealth: Payer: Self-pay | Admitting: Internal Medicine

## 2020-08-13 NOTE — Telephone Encounter (Signed)
Released records to Dr.Petty to [phone]    Release:  [id number]

## 2020-08-13 NOTE — Telephone Encounter (Signed)
Referral-  Spoke to [facility] for an appt . The scheduler will talk to Dr Mindi Junker and call pt with appt.

## 2020-08-15 ENCOUNTER — Ambulatory Visit (HOSPITAL_COMMUNITY)
Admission: RE | Admit: 2020-08-15 | Discharge: 2020-08-15 | Disposition: A | Discharge status: Home / Self Care | Payer: Medicare Other | Source: Ambulatory Visit | Attending: Internal Medicine | Admitting: Internal Medicine

## 2020-08-15 ENCOUNTER — Other Ambulatory Visit: Payer: Self-pay

## 2020-08-15 ENCOUNTER — Ambulatory Visit (HOSPITAL_COMMUNITY)
Admission: RE | Admit: 2020-08-15 | Discharge: 2020-08-15 | Disposition: A | Discharge status: Home / Self Care | Payer: Medicare Other | Source: Ambulatory Visit | Attending: Student | Admitting: Student

## 2020-08-15 DIAGNOSIS — Z9889 Other specified postprocedural states: Secondary | ICD-10-CM

## 2020-08-15 DIAGNOSIS — J910 Malignant pleural effusion: Secondary | ICD-10-CM | POA: Diagnosis not present

## 2020-08-15 DIAGNOSIS — C3492 Malignant neoplasm of unspecified part of left bronchus or lung: Secondary | ICD-10-CM | POA: Diagnosis present

## 2020-08-15 MED ORDER — LIDOCAINE HCL 1 % IJ SOLN
INTRAMUSCULAR | Status: AC
  Filled 2020-08-15: qty 20

## 2020-08-15 NOTE — Procedures (Signed)
PROCEDURE SUMMARY:  Successful image-guided left thoracentesis. Yielded 1.3 liters of clear gold fluid. EBL < 2 mL.  At beginning of procedure, patient was sitting upright. He was prepped in normal sterile fashion. Site was marked using ultrasound guidance. He was given approximately 15 cc 1% Lidocaine. Before Safe-T-Centesis was introduced, patient stated he felt lightheaded and had a vaso-vagal response (unpresonsive x 5 seconds)- BP 70s/50s during this event. He was placed in reverse trendelenburg with return of consciousness- BP improvement 110s/70s with this. Patient had resolution of symptoms at this time (denies N/V, dizziness, vision changes). Patient wished to proceed with procedure following this. He was placed in lateral decubitus position for procedure, no additional events occurred. Dr. Laurence Ferrari made aware.  Specimen was sent for labs. CXR ordered.  Please see imaging section of Epic for full dictation.   Earley Abide PA-C 08/15/2020 12:23 PM

## 2020-08-17 ENCOUNTER — Other Ambulatory Visit: Payer: Self-pay | Admitting: Internal Medicine

## 2020-08-17 DIAGNOSIS — I2609 Other pulmonary embolism with acute cor pulmonale: Secondary | ICD-10-CM

## 2020-08-19 ENCOUNTER — Telehealth: Payer: Self-pay | Admitting: *Deleted

## 2020-08-19 ENCOUNTER — Other Ambulatory Visit: Payer: Self-pay | Admitting: *Deleted

## 2020-08-19 MED ORDER — LEVETIRACETAM 750 MG PO TABS
ORAL_TABLET | ORAL | 3 refills | Status: DC
Start: 2020-08-19 — End: 2020-08-27

## 2020-08-19 NOTE — Telephone Encounter (Signed)
Patient's wife called.  Stated they had a follow up appointment at [facility] today and saw Dr. Elnoria Howard as Dr. Julien Nordmann had encouraged second opinion since patients options were becoming limited here at [facility] besides offering a chemo regimen.    Dr. Elnoria Howard ordered genetic testing on blood and lung tissue samples from thoracentesis.  Also advised Adrenal gland biopsy might be an option if warranted.    The family was advised that some of the options that they would be given are determined on whether or not new brain involvement is present.  They are requesting MRI to be moved up to next week before they go back which is planned for 2 weeks.  Routed to MD to advise if that was agreeable.   Requesting Rx refill of Keppra 90 Day supply is at all possible to [facility] on file.

## 2020-08-20 ENCOUNTER — Ambulatory Visit: Payer: Medicare Other | Admitting: Student in an Organized Health Care Education/Training Program

## 2020-08-20 LAB — CYTOLOGY - NON PAP

## 2020-08-22 ENCOUNTER — Ambulatory Visit
Admission: RE | Admit: 2020-08-22 | Discharge: 2020-08-22 | Disposition: A | Discharge status: Home / Self Care | Payer: Medicare Other | Source: Ambulatory Visit | Attending: Radiation Oncology | Admitting: Radiation Oncology

## 2020-08-22 DIAGNOSIS — C7949 Secondary malignant neoplasm of other parts of nervous system: Secondary | ICD-10-CM

## 2020-08-22 DIAGNOSIS — C7931 Secondary malignant neoplasm of brain: Secondary | ICD-10-CM

## 2020-08-22 MED ORDER — GADOBENATE DIMEGLUMINE 529 MG/ML IV SOLN
20.0000 mL | Freq: Once | INTRAVENOUS | Status: AC | PRN
  Administered 2020-08-22: 20 mL via INTRAVENOUS

## 2020-08-27 ENCOUNTER — Encounter: Payer: Self-pay | Admitting: Internal Medicine

## 2020-08-27 ENCOUNTER — Other Ambulatory Visit: Payer: Self-pay

## 2020-08-27 ENCOUNTER — Inpatient Hospital Stay: Payer: Medicare Other

## 2020-08-27 ENCOUNTER — Inpatient Hospital Stay (HOSPITAL_BASED_OUTPATIENT_CLINIC_OR_DEPARTMENT_OTHER): Payer: Medicare Other | Admitting: Internal Medicine

## 2020-08-27 ENCOUNTER — Inpatient Hospital Stay: Payer: Medicare Other | Attending: Internal Medicine | Admitting: Internal Medicine

## 2020-08-27 VITALS — BP 125/77 | HR 95 | Temp 98.5°F | Resp 18 | Ht 73.0 in | Wt 243.5 lb

## 2020-08-27 DIAGNOSIS — Z5111 Encounter for antineoplastic chemotherapy: Secondary | ICD-10-CM

## 2020-08-27 DIAGNOSIS — C3432 Malignant neoplasm of lower lobe, left bronchus or lung: Secondary | ICD-10-CM

## 2020-08-27 DIAGNOSIS — C7931 Secondary malignant neoplasm of brain: Secondary | ICD-10-CM

## 2020-08-27 DIAGNOSIS — C3492 Malignant neoplasm of unspecified part of left bronchus or lung: Secondary | ICD-10-CM | POA: Diagnosis not present

## 2020-08-27 DIAGNOSIS — Z79899 Other long term (current) drug therapy: Secondary | ICD-10-CM | POA: Insufficient documentation

## 2020-08-27 DIAGNOSIS — C7A1 Malignant poorly differentiated neuroendocrine tumors: Secondary | ICD-10-CM | POA: Insufficient documentation

## 2020-08-27 DIAGNOSIS — C3490 Malignant neoplasm of unspecified part of unspecified bronchus or lung: Secondary | ICD-10-CM

## 2020-08-27 DIAGNOSIS — R5383 Other fatigue: Secondary | ICD-10-CM

## 2020-08-27 LAB — TSH: TSH: 4.044 u[IU]/mL (ref 0.320–4.118)

## 2020-08-27 LAB — CMP (CANCER CENTER ONLY)
ALT: 30 U/L (ref 0–44)
AST: 18 U/L (ref 15–41)
Albumin: 3.4 g/dL — ABNORMAL LOW (ref 3.5–5.0)
Alkaline Phosphatase: 83 U/L (ref 38–126)
Anion gap: 9 (ref 5–15)
BUN: 11 mg/dL (ref 6–20)
CO2: 28 mmol/L (ref 22–32)
Calcium: 9.2 mg/dL (ref 8.9–10.3)
Chloride: 104 mmol/L (ref 98–111)
Creatinine: 1.27 mg/dL — ABNORMAL HIGH (ref 0.61–1.24)
GFR, Estimated: >60 mL/min (ref >60)
Glucose, Bld: 195 mg/dL — ABNORMAL HIGH (ref 70–99)
Potassium: 4.1 mmol/L (ref 3.5–5.1)
Sodium: 141 mmol/L (ref 135–145)
Total Bilirubin: 0.8 mg/dL (ref 0.3–1.2)
Total Protein: 6.9 g/dL (ref 6.5–8.1)

## 2020-08-27 LAB — CBC WITH DIFFERENTIAL (CANCER CENTER ONLY)
Abs Immature Granulocytes: 0.01 10*3/uL (ref 0.00–0.07)
Basophils Absolute: 0 10*3/uL (ref 0.0–0.1)
Basophils Relative: 1 %
Eosinophils Absolute: 0.1 10*3/uL (ref 0.0–0.5)
Eosinophils Relative: 2 %
HCT: 44 % (ref 39.0–52.0)
Hemoglobin: 14.7 g/dL (ref 13.0–17.0)
Immature Granulocytes: 0 %
Lymphocytes Relative: 12 %
Lymphs Abs: 0.7 10*3/uL (ref 0.7–4.0)
MCH: 32.9 pg (ref 26.0–34.0)
MCHC: 33.4 g/dL (ref 30.0–36.0)
MCV: 98.4 fL (ref 80.0–100.0)
Monocytes Absolute: 0.4 10*3/uL (ref 0.1–1.0)
Monocytes Relative: 7 %
Neutro Abs: 4.4 10*3/uL (ref 1.7–7.7)
Neutrophils Relative %: 78 %
Platelet Count: 299 10*3/uL (ref 150–400)
RBC: 4.47 MIL/uL (ref 4.22–5.81)
RDW: 15.9 % — ABNORMAL HIGH (ref 11.5–15.5)
WBC Count: 5.7 10*3/uL (ref 4.0–10.5)
nRBC: 0 % (ref 0.0–0.2)

## 2020-08-27 MED ORDER — LEVETIRACETAM 500 MG PO TABS
ORAL_TABLET | ORAL | 3 refills | Status: DC
Start: 2020-08-27 — End: 2020-08-28

## 2020-08-27 NOTE — Progress Notes (Signed)
[place] [phone]   [phone]  OFFICE PROGRESS NOTE  [name], MD 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  PRIOR THERAPY:  1) Stereotactic radiotherapy to a solitary brain metastasis under the care of Dr. Lisbeth Renshaw on 01/25/2017. 2) Short course of concurrent chemoradiation with weekly carboplatin for AUC of 2 and paclitaxel 45 MG/M2 to the locally advanced disease in the chest. Status post 3 cycles. 3) Systemic chemotherapy with carboplatin for AUC of 5, Alimta 500 MG/M2 and Avastin 15 MG/KG every 3 weeks. First dose of 02/15/2017. Status post 6 cycles. Last dose 06/08/2017 with stable disease. 4) whole brain irradiation under the care of Dr. Lisbeth Renshaw expected to complete on October 14, 2018. 5) Maintenance systemic chemotherapy with Alimta 500 MG/M2 in addition to Avastin 15 MG/KG every 3 weeks. First dose 07/13/2017.  Status post 20 cycles.  Starting from cycle #16 he will be treated with single agent Alimta only secondary to intolerance. 6) short course of palliative radiotherapy to the enlarging left hilar and mediastinal lymph nodes under the care of Dr. Lisbeth Renshaw. 7)  Second line treatment with immunotherapy with nivolumab 480 mg IV every 4 weeks.  First dose October 26, 2018.  Status post 23 cycles.  Last dose was given 07/09/2020 discontinued secondary to disease progression.    CURRENT THERAPY: None.  INTERVAL HISTORY: Calvin Wagner 54 y.o. male returns to the clinic today for follow-up visit accompanied by his wife.  The patient is feeling fine today with no concerning complaints except for the generalized fatigue and  weakness as well as intermittent nausea.  He denied having any current chest pain, shortness of breath, cough or hemoptysis.  He has no fever or chills.  He has no headache or visual changes.  Recent MRI of the brain showed multiple new metastatic brain lesion and he is scheduled to see Dr. Mickeal Skinner later today.  The patient also underwent ultrasound-guided left thoracentesis but the final cytology was negative for malignancy and it showed only mesothelial cells.  The patient was also seen by Dr. Elnoria Howard at [facility] for a second opinion and he ordered another guardant blood test.  His previous guardant blood test was negative for any actionable mutation.  This was done in 2018.  Dr. Elnoria Howard also suggested a treatment with carboplatin and Gemzar because of concern about the toxicity of the docetaxel and Cyramza which is very reasonable suggestion.  The patient is here today for evaluation and recommendation regarding his condition.  MEDICAL HISTORY: Past Medical History:  Diagnosis Date  . Encounter for antineoplastic chemotherapy 12/31/2016  . GERD 11/08/2009   Qualifier: Diagnosis of  By: Ronnald Ramp MD, Arvid Right Goals of care, counseling/discussion 12/31/2016  . History of radiation therapy 01/13/2017 - 02/02/2017   Site/dose:  Left lung: 30 Gy in 15 fractions  . HYPERTENSION 11/08/2009   Qualifier: Diagnosis of  By: Ronnald Ramp MD, Arvid Right.   . Large cell carcinoma of left lung, stage 4 ([place]) 12/31/2016  . Migraine 02/25/2012  . Pneumonia   . PONV (postoperative nausea and vomiting)   . Rotator cuff tear, left     ALLERGIES:  has No Known Allergies.  MEDICATIONS:  Current Outpatient Medications  Medication Sig Dispense Refill  . amLODipine (NORVASC) 5 MG tablet Take 5 mg by mouth daily.    . chlorpheniramine-HYDROcodone (TUSSIONEX) 10-8 MG/5ML SUER Take 5 mLs by mouth every 12 (twelve) hours as needed for cough. 240 mL 0  . dexamethasone (DECADRON) 2 MG tablet Take 1 tablet (2 mg total) by  mouth daily.    . famotidine (PEPCID) 20 MG tablet Take 20 mg by mouth 2 (two) times daily.    . folic acid (FOLVITE) 1 MG tablet Take 1 tablet (1 mg total) by mouth daily. 30 tablet 4  . levETIRAcetam (KEPPRA) 750 MG tablet TAKE 1 TABLET(750 MG) BY MOUTH TWICE DAILY 180 tablet 3  . LORazepam (ATIVAN) 1 MG tablet TAKE ONE TAB BY MOUTH 30 MINUTES PRIOR TO MRI OR RADIOTHERAPY. 30 tablet 0  . losartan (COZAAR) 50 MG tablet Take 50 mg by mouth daily.    . magic mouthwash SOLN Take 5 mLs by mouth 4 (four) times daily as needed for mouth pain. 1:1:1 mix-Nystatin. Benadryl and extra strength maalox. 240 mL 0  . metoprolol succinate (TOPROL XL) 25 MG 24 hr tablet Take 1 tablet (25 mg total) by mouth daily. 90 tablet 3  . Multiple Vitamin (MULTIVITAMIN WITH MINERALS) TABS tablet Take 1 tablet by mouth daily.    . ondansetron (ZOFRAN) 8 MG tablet TAKE 1 TABLET BY MOUTH EVERY 8 HOURS AS NEEDED FOR NAUSEA OR VOMITING. 90 tablet 0  . PROAIR HFA 108 (90 Base) MCG/ACT inhaler Inhale 2 puffs into the lungs every 4 (four) hours as needed. 1 each 5  . prochlorperazine (COMPAZINE) 10 MG tablet TAKE 1 TABLET BY MOUTH EVERY 6 HOURS AS NEEDED FOR NAUSEA OR VOMITING 90 tablet 0  . sucralfate (CARAFATE) 1 g tablet Crush and mix in 1 oz water and drink 5 min TID before meals and at HS for radiation induced esophagitis 120 tablet 2  . XARELTO 20 MG TABS tablet TAKE 1 TABLET(20 MG) BY MOUTH DAILY WITH SUPPER 30 tablet 2   No current facility-administered medications for this visit.    SURGICAL HISTORY:  Past Surgical History:  Procedure Laterality Date  . INGUINAL HERNIA REPAIR    . SHOULDER ARTHROSCOPY WITH ROTATOR CUFF REPAIR Left 11/12/2016   Procedure: SHOULDER ARTHROSCOPY WITH ROTATOR CUFF REPAIR AND SUBACROMIAL DECOMPRESSION;  Surgeon: Tania Ade, MD;  Location: [facility];  Service: Orthopedics;  Laterality: Left;  SHOULDER ARTHROSCOPY WITH ROTATOR CUFF REPAIR AND SUBACROMIAL DECOMPRESSION  . TONSILLECTOMY    .  TOTAL KNEE ARTHROPLASTY    . VIDEO BRONCHOSCOPY Bilateral 12/10/2016   Procedure: VIDEO BRONCHOSCOPY WITHOUT FLUORO;  Surgeon: Tanda Rockers, MD;  Location: [facility];  Service: Cardiopulmonary;  Laterality: Bilateral;    REVIEW OF SYSTEMS:  Constitutional: positive for fatigue Eyes: negative Ears, nose, mouth, throat, and face: negative Respiratory: negative Cardiovascular: positive for irregular heart beat Gastrointestinal: positive for nausea Genitourinary:negative Integument/breast: negative Hematologic/lymphatic: negative Musculoskeletal:negative Neurological: negative Behavioral/Psych: negative Endocrine: negative Allergic/Immunologic: negative   PHYSICAL EXAMINATION: General appearance: alert, cooperative, fatigued and no distress Head: Normocephalic, without obvious abnormality, atraumatic Neck: no adenopathy, no JVD, supple, symmetrical, trachea midline and thyroid not enlarged, symmetric, no tenderness/mass/nodules Lymph nodes: Cervical, supraclavicular, and axillary nodes normal. Resp: clear to auscultation bilaterally Back: symmetric, no curvature. ROM normal. No CVA tenderness. Cardio: regular rate and rhythm, S1, S2 normal, no murmur, click, rub or gallop GI: soft, non-tender; bowel sounds normal; no masses,  no organomegaly Extremities: extremities normal, atraumatic,  no cyanosis or edema Neurologic: Alert and oriented X 3, normal strength and tone. Normal symmetric reflexes. Normal coordination and gait  ECOG PERFORMANCE STATUS: 1 - Symptomatic but completely ambulatory  Blood pressure 125/77, pulse 95, temperature 98.5 F (36.9 C), temperature source Tympanic, resp. rate 18, height 6\' 1"  (1.854 m), weight 243 lb 8 oz (110.5 kg), SpO2 99 %.  LABORATORY DATA: Lab Results  Component Value Date   WBC 5.7 08/27/2020   HGB 14.7 08/27/2020   HCT 44.0 08/27/2020   MCV 98.4 08/27/2020   PLT 299 08/27/2020      Chemistry      Component Value Date/Time   NA  136 08/06/2020 0942   NA 138 10/08/2017 0907   K 3.7 08/06/2020 0942   K 4.1 10/08/2017 0907   CL 103 08/06/2020 0942   CO2 24 08/06/2020 0942   CO2 24 10/08/2017 0907   BUN 5 (L) 08/06/2020 0942   BUN 11.7 10/08/2017 0907   CREATININE 1.19 08/06/2020 0942   CREATININE 1.2 10/08/2017 0907      Component Value Date/Time   CALCIUM 9.4 08/06/2020 0942   CALCIUM 9.5 10/08/2017 0907   ALKPHOS 100 08/06/2020 0942   ALKPHOS 88 10/08/2017 0907   AST 14 (L) 08/06/2020 0942   AST 22 10/08/2017 0907   ALT 16 08/06/2020 0942   ALT 24 10/08/2017 0907   BILITOT 0.7 08/06/2020 0942   BILITOT 0.42 10/08/2017 0907       RADIOGRAPHIC STUDIES: CT Chest W Contrast  Result Date: 08/03/2020 CLINICAL DATA:  Metastatic non-small cell lung cancer.  Restaging. EXAM: CT CHEST, ABDOMEN, AND PELVIS WITH CONTRAST TECHNIQUE: Multidetector CT imaging of the chest, abdomen and pelvis was performed following the standard protocol during bolus administration of intravenous contrast. CONTRAST:  166mL OMNIPAQUE IOHEXOL 300 MG/ML  SOLN COMPARISON:  05/14/2020. FINDINGS: CT CHEST FINDINGS Cardiovascular: Normal heart size. Trace pericardial effusion is new from previous exam. Mediastinum/Nodes: Normal appearance of the thyroid gland. The trachea appears patent and is midline. Normal appearance of the esophagus. Interval increase and previously noted left pre-vascular adenopathy. Index lymph node measures 0.9 cm, image 29/2. Previously 0.5 cm. Adjacent lymph node measures 0.8 cm, image 29/2. Previously 0.3 cm. The left hilar mass has increased in size from previous exam with progressive postobstructive consolidation and atelectasis of the left lower lobe. On today's study this has a cranial caudal dimension of 3.6 cm, image 75/4. Previously 2.6 cm. Lungs/Pleura: Increase in volume of left pleural effusion and postobstructive consolidation and atelectasis of the left lower lobe. New patchy areas of ground-glass and airspace  consolidation identified within the left upper lobe, lingula and superior segment of left lower lobe. Findings are compatible with progressive postobstructive pneumonitis. Musculoskeletal: No chest wall mass or suspicious bone lesions identified. CT ABDOMEN PELVIS FINDINGS Hepatobiliary: No change in subtle low-attenuation structure within posterior right hepatic lobe measuring 1.3 cm, image 68/2. This remains indeterminate. Focal fatty infiltration is identified adjacent to the falciform ligament, image 64/2. No new liver abnormalities. The gallbladder is unremarkable. No biliary dilatation. Pancreas: Unremarkable. No pancreatic ductal dilatation or surrounding inflammatory changes. Spleen: Normal in size without focal abnormality. Adrenals/Urinary Tract: Enhancing nodule in the left adrenal gland measures 2.6 x 1.9 by 2.8 cm. Previously this measured 2.3 by 1.9 by 2.8 cm. No suspicious kidney mass or hydronephrosis. Small exophytic lesion arising from the lateral cortex of the inferior pole of left kidney is too small to characterize. Urinary bladder is unremarkable. Stomach/Bowel: Stomach is  within normal limits. Appendix appears normal. No evidence of bowel wall thickening, distention, or inflammatory changes. Vascular/Lymphatic: No significant vascular findings are present. No enlarged abdominal or pelvic lymph nodes. Reproductive: Prostate is unremarkable. Other: Signs of previous lower ventral abdominal wall herniorrhaphy. No free fluid or fluid collections. No signs of peritoneal disease. Musculoskeletal: No acute or significant osseous findings. IMPRESSION: 1. Interval progression of disease. Increase in size of left hilar mass with progressive postobstructive changes involving the left upper lobe, lingula and left lower lobe. 2. Interval increase in volume of left pleural effusion. 3. Increase in size of left prevascular lymph nodes concerning for progressive metastatic adenopathy. 4. No significant change  in size of left adrenal gland metastasis. 5. Stable, subtle indeterminate low-attenuation structure within posterior right hepatic lobe. Attention on follow-up imaging is advised. Electronically Signed   By: Kerby Moors M.D.   On: 08/03/2020 10:43   MR Brain W Wo Contrast  Addendum Date: 08/26/2020   ADDENDUM REPORT: 08/26/2020 08:10 ADDENDUM: Case reviewed at multidisciplinary Brain tumor Conference this morning. Patient has limited options with respect to radiation therapy due to fairly extensive treatment history. And this was their first restaging scan utilizing post-contrast axial black blood technique (T1 SPACE). Definitively three of the pre-existing (previously treated) lesions show some interval enlargement (including the dominant >3cm left inferior frontal lesion), AND there are many small newly seen lesions (at least twelve newly seen ranging from punctate to 5-6 mm which are annotated with single arrows on series 11 saved presentation state 08/25/2020). To assist with treatment planning in this difficult case, radiation Oncology will attempt to overlay this scan and correlate whether any of these very small newly seen lesions were present on the whole brain treatment planning study in December 2019, AND a short interval (4-6 week) repeat SRS MRI Brain (same black blood technique as this exam) will be obtained to see if any of these tiny lesions progress. Electronically Signed   By: Genevie Ann M.D.   On: 08/26/2020 08:10   Result Date: 08/26/2020 CLINICAL DATA:  Brain/CNS neoplasm, surveillance. Metastatic lung cancer. EXAM: MRI HEAD WITHOUT AND WITH CONTRAST TECHNIQUE: Multiplanar, multiecho pulse sequences of the brain and surrounding structures were obtained without and with intravenous contrast. CONTRAST:  50mL MULTIHANCE GADOBENATE DIMEGLUMINE 529 MG/ML IV SOLN COMPARISON:  MRI of the brain June 05, 2020. FINDINGS: Brain: No acute infarction, hemorrhage, hydrocephalus or extra-axial  collection. Multiple metastatic brain lesions, as described below: Stable 11 mm enhancing lesion within the left cerebellar vermis (series 11, image 59). Stable 4 mm enhancing lesion within the right anterior frontal lobe (series 11, image 91). Increased size of partially necrotic lesion in the orbital surface of the left frontal lobe, measuring approximately 36 x 28 mm (30 x 22 mm on prior) with persistent surrounding vasogenic edema (series 11, image 71). Mildly increased partially necrotic lesion in the left frontal operculum now measuring 22 mm (20 mm on prior) with persistent surrounding vasogenic edema (series 11, image 87). Increased right caudate body lesion now measuring 5 mm compared to 2 mm on prior) series 11, image 99). Increase partial necrotic high convexity right frontal lesion, measuring 10 mm (8 mm on prior) with mild surrounding vasogenic edema (series 11, image 139). At least 16 new punctate enhancing lesions throughout the brain parenchyma as labeled on series 11. Vascular: Normal flow voids. Skull and upper cervical spine: Normal marrow signal. Sinuses/Orbits: Mucosal thickening throughout the paranasal sinuses, more pronounced in the ethmoid cells. The orbits  are maintained. Other: Bilateral small mastoid effusions, left greater right. IMPRESSION: 1. Progression of metastatic disease as described. At least 16 new punctate enhancing lesions throughout the brain parenchyma and increased size of 4 pre-existent lesions. 2. Paranasal sinus disease with bilateral small mastoid effusions, left greater right. These results will be called to the ordering clinician or representative by the Radiologist Assistant, and communication documented in the PACS or [employer]. Electronically Signed: By: Pedro Earls M.D. On: 08/22/2020 18:25   CT Abdomen Pelvis W Contrast  Result Date: 08/03/2020 CLINICAL DATA:  Metastatic non-small cell lung cancer.  Restaging. EXAM: CT CHEST,  ABDOMEN, AND PELVIS WITH CONTRAST TECHNIQUE: Multidetector CT imaging of the chest, abdomen and pelvis was performed following the standard protocol during bolus administration of intravenous contrast. CONTRAST:  12mL OMNIPAQUE IOHEXOL 300 MG/ML  SOLN COMPARISON:  05/14/2020. FINDINGS: CT CHEST FINDINGS Cardiovascular: Normal heart size. Trace pericardial effusion is new from previous exam. Mediastinum/Nodes: Normal appearance of the thyroid gland. The trachea appears patent and is midline. Normal appearance of the esophagus. Interval increase and previously noted left pre-vascular adenopathy. Index lymph node measures 0.9 cm, image 29/2. Previously 0.5 cm. Adjacent lymph node measures 0.8 cm, image 29/2. Previously 0.3 cm. The left hilar mass has increased in size from previous exam with progressive postobstructive consolidation and atelectasis of the left lower lobe. On today's study this has a cranial caudal dimension of 3.6 cm, image 75/4. Previously 2.6 cm. Lungs/Pleura: Increase in volume of left pleural effusion and postobstructive consolidation and atelectasis of the left lower lobe. New patchy areas of ground-glass and airspace consolidation identified within the left upper lobe, lingula and superior segment of left lower lobe. Findings are compatible with progressive postobstructive pneumonitis. Musculoskeletal: No chest wall mass or suspicious bone lesions identified. CT ABDOMEN PELVIS FINDINGS Hepatobiliary: No change in subtle low-attenuation structure within posterior right hepatic lobe measuring 1.3 cm, image 68/2. This remains indeterminate. Focal fatty infiltration is identified adjacent to the falciform ligament, image 64/2. No new liver abnormalities. The gallbladder is unremarkable. No biliary dilatation. Pancreas: Unremarkable. No pancreatic ductal dilatation or surrounding inflammatory changes. Spleen: Normal in size without focal abnormality. Adrenals/Urinary Tract: Enhancing nodule in the  left adrenal gland measures 2.6 x 1.9 by 2.8 cm. Previously this measured 2.3 by 1.9 by 2.8 cm. No suspicious kidney mass or hydronephrosis. Small exophytic lesion arising from the lateral cortex of the inferior pole of left kidney is too small to characterize. Urinary bladder is unremarkable. Stomach/Bowel: Stomach is within normal limits. Appendix appears normal. No evidence of bowel wall thickening, distention, or inflammatory changes. Vascular/Lymphatic: No significant vascular findings are present. No enlarged abdominal or pelvic lymph nodes. Reproductive: Prostate is unremarkable. Other: Signs of previous lower ventral abdominal wall herniorrhaphy. No free fluid or fluid collections. No signs of peritoneal disease. Musculoskeletal: No acute or significant osseous findings. IMPRESSION: 1. Interval progression of disease. Increase in size of left hilar mass with progressive postobstructive changes involving the left upper lobe, lingula and left lower lobe. 2. Interval increase in volume of left pleural effusion. 3. Increase in size of left prevascular lymph nodes concerning for progressive metastatic adenopathy. 4. No significant change in size of left adrenal gland metastasis. 5. Stable, subtle indeterminate low-attenuation structure within posterior right hepatic lobe. Attention on follow-up imaging is advised. Electronically Signed   By: Kerby Moors M.D.   On: 08/03/2020 10:43   DG Chest Port 1 View  Result Date: 08/15/2020 CLINICAL DATA:  Left  thoracentesis. EXAM: PORTABLE CHEST 1 VIEW COMPARISON:  CT 08/02/2020. FINDINGS: Interim improvement in left pleural effusion following thoracentesis. No pneumothorax. Left perihilar mass/infiltrate again noted. Right lung clear. Cardiomegaly. No pulmonary venous congestion. Degenerative changes scoliosis thoracic spine. IMPRESSION: 1. Interim improvement in left pleural effusion following thoracentesis. No pneumothorax. 2. Left perihilar mass/infiltrate again  noted. 3. Cardiomegaly.  No pulmonary venous congestion. Electronically Signed   By: Marcello Moores  Register   On: 08/15/2020 12:29   ECHOCARDIOGRAM COMPLETE  Result Date: 07/30/2020    ECHOCARDIOGRAM REPORT   Patient Name:   Calvin Wagner Date of Exam: 07/30/2020 Medical Rec #:  [id number]      Height:       73.0 in Accession #:    [id number]     Weight:       248.6 lb Date of Birth:  Aug 30, 1966      BSA:          2.360 m Patient Age:    64 years       BP:           124/84 mmHg Patient Gender: M              HR:           96 bpm. Exam Location:  [facility] Procedure: 2D Echo, 3D Echo, Cardiac Doppler, Color Doppler and Strain Analysis Indications:    R00.0 Tachycardia  History:        Patient has prior history of Echocardiogram examinations, most                 recent 07/22/2014. Abnormal ECG; Risk Factors:Family History of                 Coronary Artery Disease and Hypertension. Left Lung Cancer with                 Chemo and Radiation.  Sonographer:    Deliah Boston RDCS Referring Phys: [id number] [place]  1. Left ventricular ejection fraction, by estimation, is 60 to 65%. The left ventricle has normal function. The left ventricle has no regional wall motion abnormalities. Left ventricular diastolic parameters are consistent with Grade I diastolic dysfunction (impaired relaxation). The average left ventricular global longitudinal strain is 16.1 %. The global longitudinal strain is normal.  2. Right ventricular systolic function is normal. The right ventricular size is normal.  3. The mitral valve is normal in structure. No evidence of mitral valve regurgitation. No evidence of mitral stenosis.  4. The aortic valve is tricuspid. Aortic valve regurgitation is not visualized. No aortic stenosis is present.  5. Aortic dilatation noted. There is mild dilatation at the level of the sinuses of Valsalva, measuring 44 mm.  6. The inferior vena cava is normal in size with greater than 50%  respiratory variability, suggesting right atrial pressure of 3 mmHg. FINDINGS  Left Ventricle: Left ventricular ejection fraction, by estimation, is 60 to 65%. The left ventricle has normal function. The left ventricle has no regional wall motion abnormalities. The average left ventricular global longitudinal strain is 16.1 %. The  global longitudinal strain is normal. The left ventricular internal cavity size was normal in size. There is no left ventricular hypertrophy. Left ventricular diastolic parameters are consistent with Grade I diastolic dysfunction (impaired relaxation). Indeterminate filling pressures. Right Ventricle: The right ventricular size is normal. No increase in right ventricular wall thickness. Right ventricular systolic function is normal. Left Atrium: Left atrial size was normal  in size. Right Atrium: Right atrial size was normal in size. Pericardium: There is no evidence of pericardial effusion. Mitral Valve: The mitral valve is normal in structure. No evidence of mitral valve regurgitation. No evidence of mitral valve stenosis. Tricuspid Valve: The tricuspid valve is normal in structure. Tricuspid valve regurgitation is not demonstrated. No evidence of tricuspid stenosis. Aortic Valve: The aortic valve is tricuspid. Aortic valve regurgitation is not visualized. No aortic stenosis is present. Pulmonic Valve: The pulmonic valve was normal in structure. Pulmonic valve regurgitation is trivial. No evidence of pulmonic stenosis. Aorta: Aortic dilatation noted. There is mild dilatation at the level of the sinuses of Valsalva, measuring 44 mm. Venous: The inferior vena cava is normal in size with greater than 50% respiratory variability, suggesting right atrial pressure of 3 mmHg. IAS/Shunts: No atrial level shunt detected by color flow Doppler.  LEFT VENTRICLE PLAX 2D LVIDd:         4.40 cm  Diastology LVIDs:         2.80 cm  LV e' medial:    7.83 cm/s LV PW:         1.00 cm  LV E/e' medial:  9.6  LV IVS:        1.00 cm  LV e' lateral:   9.14 cm/s LVOT diam:     2.30 cm  LV E/e' lateral: 8.2 LV SV:         72 LV SV Index:   30       2D Longitudinal Strain LVOT Area:     4.15 cm 2D Strain GLS (A2C):   15.6 %                         2D Strain GLS (A3C):   16.1 %                         2D Strain GLS (A4C):   16.8 %                         2D Strain GLS Avg:     16.1 %                          3D Volume EF:                         3D EF:        64 %                         LV EDV:       151 ml                         LV ESV:       54 ml                         LV SV:        96 ml RIGHT VENTRICLE RV S prime:     16.80 cm/s TAPSE (M-mode): 1.9 cm LEFT ATRIUM             Index       RIGHT ATRIUM          Index LA diam:  3.30 cm 1.40 cm/m  RA Area:     8.94 cm LA Vol (A2C):   54.7 ml 23.18 ml/m RA Volume:   12.80 ml 5.42 ml/m LA Vol (A4C):   42.4 ml 17.97 ml/m LA Biplane Vol: 49.4 ml 20.93 ml/m  AORTIC VALVE LVOT Vmax:   104.00 cm/s LVOT Vmean:  69.400 cm/s LVOT VTI:    0.173 m  AORTA Ao Root diam: 4.40 cm Ao Asc diam:  3.40 cm MITRAL VALVE MV Area (PHT): cm         SHUNTS MV Decel Time: 139 msec    Systemic VTI:  0.17 m MV E velocity: 75.00 cm/s  Systemic Diam: 2.30 cm MV A velocity: 90.80 cm/s MV E/A ratio:  0.83 Skeet Latch MD Electronically signed by Skeet Latch MD Signature Date/Time: 07/30/2020/6:07:20 PM    Final    LONG TERM MONITOR (3-14 DAYS)  Result Date: 08/02/2020 Enrollment 07/17/2020-07/24/2020 (6 days 16 hours). Patient had a min HR of 65 bpm (normal sinus rhythm), max HR of 187 bpm (atrial fibrillation), and avg HR of 96 bpm (normal sinus rhythm). Predominant underlying rhythm was Sinus Rhythm. First Degree AV Block was present. 8 Atrial Fibrillation/Flutter episodes occurred (<1% burden), ranging from 77-187 bpm (avg of 108 bpm), the longest lasting 1 hour 5 mins with an avg rate of 109 bpm. Isolated SVEs were frequent (8.6%, 38101), SVE Couplets were rare (<1.0%, 1205),  and SVE Triplets were rare (<1.0%, 808). Isolated VEs were rare (<1.0%), and no VE Couplets or VE Triplets were present. Diary summarized below: 07/18/20 12:37pm skipped/irregular beat(s), lightheaded coincided with sinus tachycardia 104 bpm. Impression: 1. Paroxysmal atrial fibrillation detected (8 episodes; <1% burden; longest episode 1 hour; 5 min). 2. Frequent PACs (8.6% burden). [place] [facility] [facility], [place] [address] [place], [place] [address] [phone] 11:09 AM   US Thoracentesis Asp Pleural space w/IMG guide  Result Date: 08/15/2020 INDICATION: Patient with history of non-small cell lung cancer, dyspnea, and left pleural effusion. Request made for diagnostic and therapeutic left thoracentesis EXAM: ULTRASOUND GUIDED DIAGNOSTIC AND THERAPEUTIC LEFT THORACENTESIS MEDICATIONS: 25 mL 1% lidocaine COMPLICATIONS: SIR Level A - No therapy, no consequence. PROCEDURE: An ultrasound guided thoracentesis was thoroughly discussed with the patient and questions answered. The benefits, risks, alternatives and complications were also discussed. The patient understands and wishes to proceed with the procedure. Written consent was obtained. Patient was sitting upright for procedure. Ultrasound was performed to localize and [place] an adequate pocket of fluid in the left chest. The area was then prepped and draped in the normal sterile fashion. 1% Lidocaine was used for local anesthesia. Following this (and before 4 Safe-T-Centesis was introduced), patient stated that he felt lightheaded and had a vasovagal response (unresponsive x5 seconds) -BP 70s/50s during this event. He was placed in reverse Trendelenburg with return of consciousness-BP improvement 110s/70s with this. Patient had resolution of symptoms at this time (denies nausea/vomiting, dizziness, vision changes). Patient wished to proceed with procedure following this. He was placed in a lateral decubitus position for  procedure, no additional events occurred following this. Ultrasound was performed to localize and marked adequate pocket of fluid in the left chest. The area was then prepped and draped in normal sterile fashion. 1% lidocaine was used for local anesthesia. Under ultrasound guidance a 6 Fr Safe-T-Centesis catheter was introduced. Thoracentesis was performed. The catheter was removed and a dressing applied. Dr. Laurence Ferrari made aware. FINDINGS: A total of approximately 1.3 L  of clear gold fluid was removed. Samples were sent to the laboratory as requested by the clinical team. IMPRESSION: Successful ultrasound guided left thoracentesis yielding 1.3 L of pleural fluid. Read by: Earley Abide, PA-C Electronically Signed   By: Jacqulynn Cadet M.D.   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 The patient is status post stereotactic radiotherapy to the solitary brain metastasis as well as short course of concurrent chemoradiation to the locally advanced disease in the chest. He recently completed 6 cycles of systemic chemotherapy with carboplatin, Alimta and Avastin and tolerated this treatment fairly well. He was started on maintenance treatment with Alimta and Avastin status post 15 cycles.  Avastin was discontinued secondary to intolerance to the combination treatment.  He is currently on treatment with single agent Alimta status post 5 cycles. He has been tolerating his treatment well. Unfortunately he was recently found to have multiple metastatic brain lesion and the patient underwent whole brain irradiation. His scan showed evidence for disease progression with enlargement of left lower lobe lung nodule in addition to hilar lymphadenopathy as well as progression of the  liver lesion. He was started on treatment with nivolumab 480 mg IV every 4 weeks status post 23 cycles.  The patient has been tolerating this treatment well.  This treatment was discontinued secondary to disease progression. The patient had a molecular studies done by guardant recently at [facility] and we are still waiting for the results. The pleural fluid was negative for malignant cells and we could not run the molecular studies on it. He scheduled to see Dr. Elnoria Howard at [facility] next week for reevaluation and discussion of the guardant blood test and further recommendation regarding his condition. I explained to the patient that I am in agreement with Dr. Elnoria Howard regarding treatment with carboplatin and gemcitabine or even single agent gemcitabine.  This can be done in [place] or [place] however it is more convenient for the patient and his family. The wife and the patient will call me after their visit with Dr. Elnoria Howard next week regarding future treatment and plans. He was advised to call immediately if he has any other concerning symptoms in the interval. The patient voices understanding of current disease status and treatment options and is in agreement with the current care plan. All questions were answered. The patient knows to call the clinic with any problems, questions or concerns. We can certainly see the patient much sooner if necessary.  Disclaimer: This note was dictated with voice recognition software. Similar sounding words can inadvertently be transcribed and may not be corrected upon review.

## 2020-08-27 NOTE — Progress Notes (Signed)
[facility] [place], [place] [address] [phone]   Interval Evaluation  Date of Service: 08/27/20 Patient Name: Calvin Wagner Patient MRN: [id number] Patient DOB: Oct 24, 1965 Provider: Ventura Sellers, MD  Identifying Statement:  Calvin Wagner is a 54 y.o. male with Brain metastasis ([name]) [C79.31]   Primary Cancer:  Oncologic History: Oncology History Overview Note  Patient presented with onset of cough then followed with scans.       Cancer ([place]) (Resolved)  11/25/2016 Initial Diagnosis   Cancer ([place])   11/25/2016 Imaging   CTA IMPRESSION: 1. Multiple bilateral acute pulmonary emboli, segmental to subsegmental size. 2. 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 There is complete obstruction of the lingular and left lower lobe airways. 1 cm pulmonary nodule in the left lateral costophrenic sulcus.   11/26/2016 Imaging   CT Abdomen No mass or adenopathy in the abdomen or pelvis   12/10/2016 Surgery   Operation: Video Flexible fiberoptic bronchoscopy, diagnostic    12/17/2016 Imaging   PET IMPRESSION: 1. Extensive left hilar and mediastinal all hypermetabolic lymphadenopathy, compatible with underlying malignancy. These findings could reflect either primary lymphoma or bronchogenic carcinoma such as a small cell carcinoma. 2. There is also a small left lower lobe pulmonary nodule measuring 11 mm which is mildly hypermetabolic. This could potentially represent the primary bronchogenic neoplasm, but it is un usual for a small nodule of this size to result in such extensive lymphadenopathy.   01/02/2017 Imaging   MRI Brain IMPRESSION:  Positive for a solitary 13 mm rim enhancing metastasis in the left anterior inferior frontal gyrus (series 10, image 72. No associated edema or mass effect.   01/04/2017 -  Radiation Therapy   SIM Chest    01/11/2017 -  Chemotherapy   The  patient had palonosetron (ALOXI) injection 0.25 mg, 0.25 mg, Intravenous,  Once, 1 of 7 cycles  CARBOplatin (PARAPLATIN) 280 mg in sodium chloride 0.9 % 250 mL chemo infusion, 280 mg (100 % of original dose 281.6 mg), Intravenous,  Once, 1 of 7 cycles Dose modification: 281.6 mg (original dose 281.6 mg, Cycle 1)  PACLitaxel (TAXOL) 102 mg in dextrose 5 % 250 mL chemo infusion ( for chemotherapy treatment.     Large cell carcinoma of left lung, stage 4 (HCC)  12/31/2016 Initial Diagnosis   Large cell carcinoma of left lung, stage 4 ([place])   10/27/2018 - 07/09/2020 Chemotherapy   The patient had nivolumab (OPDIVO) 480 mg in sodium chloride 0.9 % 100 mL chemo infusion, 480 mg, Intravenous, Once, 23 of 28 cycles Administration: 480 mg (10/27/2018), 480 mg (11/24/2018), 480 mg (12/22/2018), 480 mg (01/19/2019), 480 mg (02/16/2019), 480 mg (03/16/2019), 480 mg (04/10/2019), 480 mg (05/11/2019), 480 mg (06/08/2019), 480 mg (07/05/2019), 480 mg (08/02/2019), 480 mg (08/30/2019), 480 mg (09/29/2019), 480 mg (10/25/2019), 480 mg (11/22/2019), 480 mg (12/20/2019), 480 mg (01/25/2020), 480 mg (02/22/2020), 480 mg (03/21/2020), 480 mg (04/17/2020), 480 mg (05/15/2020), 480 mg (06/11/2020), 480 mg (07/09/2020)  for chemotherapy treatment.     CNS Oncologic History 01/25/17: SRS Treatment PTV1Left Frontal 26mm: 20 Gy in 1 fraction 10/14/18: Whole Brain / 30 Gy in 10 fractions 11/03/19: SRS L temporal 20Gy ([place]) 02/16/20: SRS to R parietal and cerebellar vermis targets 06/19/20: SRS to 2 additional metastases  Interval History:  Calvin Wagner presents to clinic for evaluation following MRI brain.  No further seizures or seizure auras since prior visit.  Currently  on tapering dose of decadron for skin rash, thought to be drug reaction. Unfortunately is progressive systemically and recently had evaluation for clinical trial at [facility]. He remains functionally independent and intact otherwise.  No issues with gait or cognition.   Complains of "always feeling tired" after dosing Keppra each day.  Medications: Current Outpatient Medications on File Prior to Visit  Medication Sig Dispense Refill  . amLODipine (NORVASC) 5 MG tablet Take 5 mg by mouth daily.    . chlorpheniramine-HYDROcodone (TUSSIONEX) 10-8 MG/5ML SUER Take 5 mLs by mouth every 12 (twelve) hours as needed for cough. 240 mL 0  . dexamethasone (DECADRON) 2 MG tablet Take 1 tablet (2 mg total) by mouth daily.    . famotidine (PEPCID) 20 MG tablet Take 20 mg by mouth 2 (two) times daily.    . folic acid (FOLVITE) 1 MG tablet Take 1 tablet (1 mg total) by mouth daily. 30 tablet 4  . levETIRAcetam (KEPPRA) 750 MG tablet TAKE 1 TABLET(750 MG) BY MOUTH TWICE DAILY 180 tablet 3  . LORazepam (ATIVAN) 1 MG tablet TAKE ONE TAB BY MOUTH 30 MINUTES PRIOR TO MRI OR RADIOTHERAPY. 30 tablet 0  . losartan (COZAAR) 50 MG tablet Take 50 mg by mouth daily.    . magic mouthwash SOLN Take 5 mLs by mouth 4 (four) times daily as needed for mouth pain. 1:1:1 mix-Nystatin. Benadryl and extra strength maalox. 240 mL 0  . metoprolol succinate (TOPROL XL) 25 MG 24 hr tablet Take 1 tablet (25 mg total) by mouth daily. 90 tablet 3  . Multiple Vitamin (MULTIVITAMIN WITH MINERALS) TABS tablet Take 1 tablet by mouth daily.    . ondansetron (ZOFRAN) 8 MG tablet TAKE 1 TABLET BY MOUTH EVERY 8 HOURS AS NEEDED FOR NAUSEA OR VOMITING. 90 tablet 0  . PROAIR HFA 108 (90 Base) MCG/ACT inhaler Inhale 2 puffs into the lungs every 4 (four) hours as needed. 1 each 5  . prochlorperazine (COMPAZINE) 10 MG tablet TAKE 1 TABLET BY MOUTH EVERY 6 HOURS AS NEEDED FOR NAUSEA OR VOMITING 90 tablet 0  . sucralfate (CARAFATE) 1 g tablet Crush and mix in 1 oz water and drink 5 min TID before meals and at HS for radiation induced esophagitis 120 tablet 2  . XARELTO 20 MG TABS tablet TAKE 1 TABLET(20 MG) BY MOUTH DAILY WITH SUPPER 30 tablet 2   No current facility-administered medications on file prior to visit.     Allergies: No Known Allergies Past Medical History:  Past Medical History:  Diagnosis Date  . Encounter for antineoplastic chemotherapy 12/31/2016  . GERD 11/08/2009   Qualifier: Diagnosis of  By: Ronnald Ramp MD, Arvid Right Goals of care, counseling/discussion 12/31/2016  . History of radiation therapy 01/13/2017 - 02/02/2017   Site/dose:  Left lung: 30 Gy in 15 fractions  . HYPERTENSION 11/08/2009   Qualifier: Diagnosis of  By: Ronnald Ramp MD, Arvid Right.   . Large cell carcinoma of left lung, stage 4 ([place]) 12/31/2016  . Migraine 02/25/2012  . Pneumonia   . PONV (postoperative nausea and vomiting)   . Rotator cuff tear, left    Past Surgical History:  Past Surgical History:  Procedure Laterality Date  . INGUINAL HERNIA REPAIR    . SHOULDER ARTHROSCOPY WITH ROTATOR CUFF REPAIR Left 11/12/2016   Procedure: SHOULDER ARTHROSCOPY WITH ROTATOR CUFF REPAIR AND SUBACROMIAL DECOMPRESSION;  Surgeon: Tania Ade, MD;  Location: [facility];  Service: Orthopedics;  Laterality: Left;  SHOULDER ARTHROSCOPY WITH ROTATOR  CUFF REPAIR AND SUBACROMIAL DECOMPRESSION  . TONSILLECTOMY    . TOTAL KNEE ARTHROPLASTY    . VIDEO BRONCHOSCOPY Bilateral 12/10/2016   Procedure: VIDEO BRONCHOSCOPY WITHOUT FLUORO;  Surgeon: Tanda Rockers, MD;  Location: [facility];  Service: Cardiopulmonary;  Laterality: Bilateral;   Social History:  Social History   Socioeconomic History  . Marital status: Married    Spouse name: Baker Janus  . Number of children: 2  . Years of education: Not on file  . Highest education level: Not on file  Occupational History  . Occupation: Material Therapist, sports: [employer]  Tobacco Use  . Smoking status: Never Smoker  . Smokeless tobacco: Never Used  Vaping Use  . Vaping Use: Never used  Substance and Sexual Activity  . Alcohol use: Yes    Comment: social  . Drug use: No  . Sexual activity: Not Currently  Other Topics Concern  . Not on file  Social History Narrative   Lives at  home wife and 2 kids   Caffeine use: none     Social Determinants of Health   Financial Resource Strain:   . Difficulty of Paying Living Expenses: Not on file  Food Insecurity:   . Worried About Charity fundraiser in the Last Year: Not on file  . Ran Out of Food in the Last Year: Not on file  Transportation Needs:   . Lack of Transportation (Medical): Not on file  . Lack of Transportation (Non-Medical): Not on file  Physical Activity:   . Days of Exercise per Week: Not on file  . Minutes of Exercise per Session: Not on file  Stress:   . Feeling of Stress : Not on file  Social Connections:   . Frequency of Communication with Friends and Family: Not on file  . Frequency of Social Gatherings with Friends and Family: Not on file  . Attends Religious Services: Not on file  . Active Member of Clubs or Organizations: Not on file  . Attends Archivist Meetings: Not on file  . Marital Status: Not on file  Intimate Partner Violence:   . Fear of Current or Ex-Partner: Not on file  . Emotionally Abused: Not on file  . Physically Abused: Not on file  . Sexually Abused: Not on file   Family History:  Family History  Problem Relation Age of Onset  . Heart disease Mother 35  . Lung cancer Other        family history  . CAD Other        strong family history male and male <50  . Mental retardation Other   . Heart attack Brother 17  . Alcohol abuse Neg Hx   . Diabetes Neg Hx   . Early death Neg Hx   . Hyperlipidemia Neg Hx   . Hypertension Neg Hx   . Kidney disease Neg Hx   . Stroke Neg Hx   . Migraines Neg Hx     Review of Systems: Constitutional: Denies fevers, chills or abnormal weight loss Eyes: Denies blurriness of vision Ears, nose, mouth, throat, and face: Denies mucositis or sore throat Respiratory: Denies cough, dyspnea or wheezes Cardiovascular: Denies palpitation, chest discomfort or lower extremity swelling Gastrointestinal:  Denies nausea,  constipation, diarrhea GU: Denies dysuria or incontinence Skin: Denies abnormal skin rashes Neurological: Per HPI Musculoskeletal: Denies joint pain, back or neck discomfort. No decrease in ROM Behavioral/Psych: Denies anxiety, disturbance in thought content, and mood instability  Physical Exam: Vitals with BMI 08/27/2020 08/15/2020 08/15/2020  Height 6\' 1"  - -  Weight 243 lbs 8 oz - -  BMI 54.27 - -  Systolic 062 376 283  Diastolic 77 76 151  Pulse 95 - -   KPS: 90. General: Alert, cooperative, pleasant, in no acute distress Head: Normal EENT: No conjunctival injection or scleral icterus. Oral mucosa moist Lungs: Resp effort normal Cardiac: Regular rate and rhythm Abdomen: Soft, non-distended abdomen Skin: No rashes cyanosis or petechiae. Extremities: No clubbing or edema  Neurologic Exam: Mental Status: Awake, alert, attentive to examiner. Oriented to self and environment. Language is fluent with intact comprehension.  Cranial Nerves: Visual acuity is grossly normal. Visual fields are full. Extra-ocular movements intact. No ptosis. Face is symmetric, tongue midline. Motor: Tone and bulk are normal. Power is full in both arms and legs. Reflexes are symmetric, no pathologic reflexes present. Intact finger to nose bilaterally Sensory: Intact to light touch and temperature Gait: Normal    Labs: I have reviewed the data as listed    Component Value Date/Time   NA 141 08/27/2020 0858   NA 138 10/08/2017 0907   K 4.1 08/27/2020 0858   K 4.1 10/08/2017 0907   CL 104 08/27/2020 0858   CO2 28 08/27/2020 0858   CO2 24 10/08/2017 0907   GLUCOSE 195 (H) 08/27/2020 0858   GLUCOSE 138 10/08/2017 0907   BUN 11 08/27/2020 0858   BUN 11.7 10/08/2017 0907   CREATININE 1.27 (H) 08/27/2020 0858   CREATININE 1.2 10/08/2017 0907   CALCIUM 9.2 08/27/2020 0858   CALCIUM 9.5 10/08/2017 0907   PROT 6.9 08/27/2020 0858   PROT 7.5 10/08/2017 0907   ALBUMIN 3.4 (L) 08/27/2020 0858    ALBUMIN 3.6 10/08/2017 0907   AST 18 08/27/2020 0858   AST 22 10/08/2017 0907   ALT 30 08/27/2020 0858   ALT 24 10/08/2017 0907   ALKPHOS 83 08/27/2020 0858   ALKPHOS 88 10/08/2017 0907   BILITOT 0.8 08/27/2020 0858   BILITOT 0.42 10/08/2017 0907   GFRNONAA >60 08/27/2020 0858   GFRAA >60 07/09/2020 0931   Lab Results  Component Value Date   WBC 5.7 08/27/2020   NEUTROABS 4.4 08/27/2020   HGB 14.7 08/27/2020   HCT 44.0 08/27/2020   MCV 98.4 08/27/2020   PLT 299 08/27/2020   Imaging:  [place] Clinician Interpretation: I have personally reviewed the CNS images as listed.  My interpretation, in the context of the patient's clinical presentation, is progressive disease  CT Chest W Contrast  Result Date: 08/03/2020 CLINICAL DATA:  Metastatic non-small cell lung cancer.  Restaging. EXAM: CT CHEST, ABDOMEN, AND PELVIS WITH CONTRAST TECHNIQUE: Multidetector CT imaging of the chest, abdomen and pelvis was performed following the standard protocol during bolus administration of intravenous contrast. CONTRAST:  180mL OMNIPAQUE IOHEXOL 300 MG/ML  SOLN COMPARISON:  05/14/2020. FINDINGS: CT CHEST FINDINGS Cardiovascular: Normal heart size. Trace pericardial effusion is new from previous exam. Mediastinum/Nodes: Normal appearance of the thyroid gland. The trachea appears patent and is midline. Normal appearance of the esophagus. Interval increase and previously noted left pre-vascular adenopathy. Index lymph node measures 0.9 cm, image 29/2. Previously 0.5 cm. Adjacent lymph node measures 0.8 cm, image 29/2. Previously 0.3 cm. The left hilar mass has increased in size from previous exam with progressive postobstructive consolidation and atelectasis of the left lower lobe. On today's study this has a cranial caudal dimension of 3.6 cm, image 75/4. Previously 2.6 cm. Lungs/Pleura: Increase in volume  of left pleural effusion and postobstructive consolidation and atelectasis of the left lower lobe. New patchy  areas of ground-glass and airspace consolidation identified within the left upper lobe, lingula and superior segment of left lower lobe. Findings are compatible with progressive postobstructive pneumonitis. Musculoskeletal: No chest wall mass or suspicious bone lesions identified. CT ABDOMEN PELVIS FINDINGS Hepatobiliary: No change in subtle low-attenuation structure within posterior right hepatic lobe measuring 1.3 cm, image 68/2. This remains indeterminate. Focal fatty infiltration is identified adjacent to the falciform ligament, image 64/2. No new liver abnormalities. The gallbladder is unremarkable. No biliary dilatation. Pancreas: Unremarkable. No pancreatic ductal dilatation or surrounding inflammatory changes. Spleen: Normal in size without focal abnormality. Adrenals/Urinary Tract: Enhancing nodule in the left adrenal gland measures 2.6 x 1.9 by 2.8 cm. Previously this measured 2.3 by 1.9 by 2.8 cm. No suspicious kidney mass or hydronephrosis. Small exophytic lesion arising from the lateral cortex of the inferior pole of left kidney is too small to characterize. Urinary bladder is unremarkable. Stomach/Bowel: Stomach is within normal limits. Appendix appears normal. No evidence of bowel wall thickening, distention, or inflammatory changes. Vascular/Lymphatic: No significant vascular findings are present. No enlarged abdominal or pelvic lymph nodes. Reproductive: Prostate is unremarkable. Other: Signs of previous lower ventral abdominal wall herniorrhaphy. No free fluid or fluid collections. No signs of peritoneal disease. Musculoskeletal: No acute or significant osseous findings. IMPRESSION: 1. Interval progression of disease. Increase in size of left hilar mass with progressive postobstructive changes involving the left upper lobe, lingula and left lower lobe. 2. Interval increase in volume of left pleural effusion. 3. Increase in size of left prevascular lymph nodes concerning for progressive metastatic  adenopathy. 4. No significant change in size of left adrenal gland metastasis. 5. Stable, subtle indeterminate low-attenuation structure within posterior right hepatic lobe. Attention on follow-up imaging is advised. Electronically Signed   By: Kerby Moors M.D.   On: 08/03/2020 10:43   MR Brain W Wo Contrast  Addendum Date: 08/26/2020   ADDENDUM REPORT: 08/26/2020 08:10 ADDENDUM: Case reviewed at multidisciplinary Brain tumor Conference this morning. Patient has limited options with respect to radiation therapy due to fairly extensive treatment history. And this was their first restaging scan utilizing post-contrast axial black blood technique (T1 SPACE). Definitively three of the pre-existing (previously treated) lesions show some interval enlargement (including the dominant >3cm left inferior frontal lesion), AND there are many small newly seen lesions (at least twelve newly seen ranging from punctate to 5-6 mm which are annotated with single arrows on series 11 saved presentation state 08/25/2020). To assist with treatment planning in this difficult case, radiation Oncology will attempt to overlay this scan and correlate whether any of these very small newly seen lesions were present on the whole brain treatment planning study in December 2019, AND a short interval (4-6 week) repeat SRS MRI Brain (same black blood technique as this exam) will be obtained to see if any of these tiny lesions progress. Electronically Signed   By: Genevie Ann M.D.   On: 08/26/2020 08:10   Result Date: 08/26/2020 CLINICAL DATA:  Brain/CNS neoplasm, surveillance. Metastatic lung cancer. EXAM: MRI HEAD WITHOUT AND WITH CONTRAST TECHNIQUE: Multiplanar, multiecho pulse sequences of the brain and surrounding structures were obtained without and with intravenous contrast. CONTRAST:  20mL MULTIHANCE GADOBENATE DIMEGLUMINE 529 MG/ML IV SOLN COMPARISON:  MRI of the brain June 05, 2020. FINDINGS: Brain: No acute infarction, hemorrhage,  hydrocephalus or extra-axial collection. Multiple metastatic brain lesions, as described below: Stable 11  mm enhancing lesion within the left cerebellar vermis (series 11, image 59). Stable 4 mm enhancing lesion within the right anterior frontal lobe (series 11, image 91). Increased size of partially necrotic lesion in the orbital surface of the left frontal lobe, measuring approximately 36 x 28 mm (30 x 22 mm on prior) with persistent surrounding vasogenic edema (series 11, image 71). Mildly increased partially necrotic lesion in the left frontal operculum now measuring 22 mm (20 mm on prior) with persistent surrounding vasogenic edema (series 11, image 87). Increased right caudate body lesion now measuring 5 mm compared to 2 mm on prior) series 11, image 99). Increase partial necrotic high convexity right frontal lesion, measuring 10 mm (8 mm on prior) with mild surrounding vasogenic edema (series 11, image 139). At least 16 new punctate enhancing lesions throughout the brain parenchyma as labeled on series 11. Vascular: Normal flow voids. Skull and upper cervical spine: Normal marrow signal. Sinuses/Orbits: Mucosal thickening throughout the paranasal sinuses, more pronounced in the ethmoid cells. The orbits are maintained. Other: Bilateral small mastoid effusions, left greater right. IMPRESSION: 1. Progression of metastatic disease as described. At least 16 new punctate enhancing lesions throughout the brain parenchyma and increased size of 4 pre-existent lesions. 2. Paranasal sinus disease with bilateral small mastoid effusions, left greater right. These results will be called to the ordering clinician or representative by the Radiologist Assistant, and communication documented in the PACS or [employer]. Electronically Signed: By: Pedro Earls M.D. On: 08/22/2020 18:25   CT Abdomen Pelvis W Contrast  Result Date: 08/03/2020 CLINICAL DATA:  Metastatic non-small cell lung cancer.   Restaging. EXAM: CT CHEST, ABDOMEN, AND PELVIS WITH CONTRAST TECHNIQUE: Multidetector CT imaging of the chest, abdomen and pelvis was performed following the standard protocol during bolus administration of intravenous contrast. CONTRAST:  161mL OMNIPAQUE IOHEXOL 300 MG/ML  SOLN COMPARISON:  05/14/2020. FINDINGS: CT CHEST FINDINGS Cardiovascular: Normal heart size. Trace pericardial effusion is new from previous exam. Mediastinum/Nodes: Normal appearance of the thyroid gland. The trachea appears patent and is midline. Normal appearance of the esophagus. Interval increase and previously noted left pre-vascular adenopathy. Index lymph node measures 0.9 cm, image 29/2. Previously 0.5 cm. Adjacent lymph node measures 0.8 cm, image 29/2. Previously 0.3 cm. The left hilar mass has increased in size from previous exam with progressive postobstructive consolidation and atelectasis of the left lower lobe. On today's study this has a cranial caudal dimension of 3.6 cm, image 75/4. Previously 2.6 cm. Lungs/Pleura: Increase in volume of left pleural effusion and postobstructive consolidation and atelectasis of the left lower lobe. New patchy areas of ground-glass and airspace consolidation identified within the left upper lobe, lingula and superior segment of left lower lobe. Findings are compatible with progressive postobstructive pneumonitis. Musculoskeletal: No chest wall mass or suspicious bone lesions identified. CT ABDOMEN PELVIS FINDINGS Hepatobiliary: No change in subtle low-attenuation structure within posterior right hepatic lobe measuring 1.3 cm, image 68/2. This remains indeterminate. Focal fatty infiltration is identified adjacent to the falciform ligament, image 64/2. No new liver abnormalities. The gallbladder is unremarkable. No biliary dilatation. Pancreas: Unremarkable. No pancreatic ductal dilatation or surrounding inflammatory changes. Spleen: Normal in size without focal abnormality. Adrenals/Urinary Tract:  Enhancing nodule in the left adrenal gland measures 2.6 x 1.9 by 2.8 cm. Previously this measured 2.3 by 1.9 by 2.8 cm. No suspicious kidney mass or hydronephrosis. Small exophytic lesion arising from the lateral cortex of the inferior pole of left kidney is too small to  characterize. Urinary bladder is unremarkable. Stomach/Bowel: Stomach is within normal limits. Appendix appears normal. No evidence of bowel wall thickening, distention, or inflammatory changes. Vascular/Lymphatic: No significant vascular findings are present. No enlarged abdominal or pelvic lymph nodes. Reproductive: Prostate is unremarkable. Other: Signs of previous lower ventral abdominal wall herniorrhaphy. No free fluid or fluid collections. No signs of peritoneal disease. Musculoskeletal: No acute or significant osseous findings. IMPRESSION: 1. Interval progression of disease. Increase in size of left hilar mass with progressive postobstructive changes involving the left upper lobe, lingula and left lower lobe. 2. Interval increase in volume of left pleural effusion. 3. Increase in size of left prevascular lymph nodes concerning for progressive metastatic adenopathy. 4. No significant change in size of left adrenal gland metastasis. 5. Stable, subtle indeterminate low-attenuation structure within posterior right hepatic lobe. Attention on follow-up imaging is advised. Electronically Signed   By: Kerby Moors M.D.   On: 08/03/2020 10:43   DG Chest Port 1 View  Result Date: 08/15/2020 CLINICAL DATA:  Left thoracentesis. EXAM: PORTABLE CHEST 1 VIEW COMPARISON:  CT 08/02/2020. FINDINGS: Interim improvement in left pleural effusion following thoracentesis. No pneumothorax. Left perihilar mass/infiltrate again noted. Right lung clear. Cardiomegaly. No pulmonary venous congestion. Degenerative changes scoliosis thoracic spine. IMPRESSION: 1. Interim improvement in left pleural effusion following thoracentesis. No pneumothorax. 2. Left perihilar  mass/infiltrate again noted. 3. Cardiomegaly.  No pulmonary venous congestion. Electronically Signed   By: Marcello Moores  Register   On: 08/15/2020 12:29   ECHOCARDIOGRAM COMPLETE  Result Date: 07/30/2020    ECHOCARDIOGRAM REPORT   Patient Name:   FLORENTINO LAABS Date of Exam: 07/30/2020 Medical Rec #:  [id number]      Height:       73.0 in Accession #:    [id number]     Weight:       248.6 lb Date of Birth:  1966/10/02      BSA:          2.360 m Patient Age:    68 years       BP:           124/84 mmHg Patient Gender: M              HR:           96 bpm. Exam Location:  [facility] Procedure: 2D Echo, 3D Echo, Cardiac Doppler, Color Doppler and Strain Analysis Indications:    R00.0 Tachycardia  History:        Patient has prior history of Echocardiogram examinations, most                 recent 07/22/2014. Abnormal ECG; Risk Factors:Family History of                 Coronary Artery Disease and Hypertension. Left Lung Cancer with                 Chemo and Radiation.  Sonographer:    Deliah Boston RDCS Referring Phys: [id number] [place]  1. Left ventricular ejection fraction, by estimation, is 60 to 65%. The left ventricle has normal function. The left ventricle has no regional wall motion abnormalities. Left ventricular diastolic parameters are consistent with Grade I diastolic dysfunction (impaired relaxation). The average left ventricular global longitudinal strain is 16.1 %. The global longitudinal strain is normal.  2. Right ventricular systolic function is normal. The right ventricular size is normal.  3. The mitral valve is normal in structure. No evidence of  mitral valve regurgitation. No evidence of mitral stenosis.  4. The aortic valve is tricuspid. Aortic valve regurgitation is not visualized. No aortic stenosis is present.  5. Aortic dilatation noted. There is mild dilatation at the level of the sinuses of Valsalva, measuring 44 mm.  6. The inferior vena cava is normal in size with  greater than 50% respiratory variability, suggesting right atrial pressure of 3 mmHg. FINDINGS  Left Ventricle: Left ventricular ejection fraction, by estimation, is 60 to 65%. The left ventricle has normal function. The left ventricle has no regional wall motion abnormalities. The average left ventricular global longitudinal strain is 16.1 %. The  global longitudinal strain is normal. The left ventricular internal cavity size was normal in size. There is no left ventricular hypertrophy. Left ventricular diastolic parameters are consistent with Grade I diastolic dysfunction (impaired relaxation). Indeterminate filling pressures. Right Ventricle: The right ventricular size is normal. No increase in right ventricular wall thickness. Right ventricular systolic function is normal. Left Atrium: Left atrial size was normal in size. Right Atrium: Right atrial size was normal in size. Pericardium: There is no evidence of pericardial effusion. Mitral Valve: The mitral valve is normal in structure. No evidence of mitral valve regurgitation. No evidence of mitral valve stenosis. Tricuspid Valve: The tricuspid valve is normal in structure. Tricuspid valve regurgitation is not demonstrated. No evidence of tricuspid stenosis. Aortic Valve: The aortic valve is tricuspid. Aortic valve regurgitation is not visualized. No aortic stenosis is present. Pulmonic Valve: The pulmonic valve was normal in structure. Pulmonic valve regurgitation is trivial. No evidence of pulmonic stenosis. Aorta: Aortic dilatation noted. There is mild dilatation at the level of the sinuses of Valsalva, measuring 44 mm. Venous: The inferior vena cava is normal in size with greater than 50% respiratory variability, suggesting right atrial pressure of 3 mmHg. IAS/Shunts: No atrial level shunt detected by color flow Doppler.  LEFT VENTRICLE PLAX 2D LVIDd:         4.40 cm  Diastology LVIDs:         2.80 cm  LV e' medial:    7.83 cm/s LV PW:         1.00 cm  LV  E/e' medial:  9.6 LV IVS:        1.00 cm  LV e' lateral:   9.14 cm/s LVOT diam:     2.30 cm  LV E/e' lateral: 8.2 LV SV:         72 LV SV Index:   30       2D Longitudinal Strain LVOT Area:     4.15 cm 2D Strain GLS (A2C):   15.6 %                         2D Strain GLS (A3C):   16.1 %                         2D Strain GLS (A4C):   16.8 %                         2D Strain GLS Avg:     16.1 %                          3D Volume EF:  3D EF:        64 %                         LV EDV:       151 ml                         LV ESV:       54 ml                         LV SV:        96 ml RIGHT VENTRICLE RV S prime:     16.80 cm/s TAPSE (M-mode): 1.9 cm LEFT ATRIUM             Index       RIGHT ATRIUM          Index LA diam:        3.30 cm 1.40 cm/m  RA Area:     8.94 cm LA Vol (A2C):   54.7 ml 23.18 ml/m RA Volume:   12.80 ml 5.42 ml/m LA Vol (A4C):   42.4 ml 17.97 ml/m LA Biplane Vol: 49.4 ml 20.93 ml/m  AORTIC VALVE LVOT Vmax:   104.00 cm/s LVOT Vmean:  69.400 cm/s LVOT VTI:    0.173 m  AORTA Ao Root diam: 4.40 cm Ao Asc diam:  3.40 cm MITRAL VALVE MV Area (PHT): cm         SHUNTS MV Decel Time: 139 msec    Systemic VTI:  0.17 m MV E velocity: 75.00 cm/s  Systemic Diam: 2.30 cm MV A velocity: 90.80 cm/s MV E/A ratio:  0.83 Skeet Latch MD Electronically signed by Skeet Latch MD Signature Date/Time: 07/30/2020/6:07:20 PM    Final    LONG TERM MONITOR (3-14 DAYS)  Result Date: 08/02/2020 Enrollment 07/17/2020-07/24/2020 (6 days 16 hours). Patient had a min HR of 65 bpm (normal sinus rhythm), max HR of 187 bpm (atrial fibrillation), and avg HR of 96 bpm (normal sinus rhythm). Predominant underlying rhythm was Sinus Rhythm. First Degree AV Block was present. 8 Atrial Fibrillation/Flutter episodes occurred (<1% burden), ranging from 77-187 bpm (avg of 108 bpm), the longest lasting 1 hour 5 mins with an avg rate of 109 bpm. Isolated SVEs were frequent (8.6%, 65784), SVE Couplets were  rare (<1.0%, 1205), and SVE Triplets were rare (<1.0%, 808). Isolated VEs were rare (<1.0%), and no VE Couplets or VE Triplets were present. Diary summarized below: 07/18/20 12:37pm skipped/irregular beat(s), lightheaded coincided with sinus tachycardia 104 bpm. Impression: 1. Paroxysmal atrial fibrillation detected (8 episodes; <1% burden; longest episode 1 hour; 5 min). 2. Frequent PACs (8.6% burden). [place] T. Audie Box, [place] [address] [place], [place] [address] [phone] 11:09 AM   US Thoracentesis Asp Pleural space w/IMG guide  Result Date: 08/15/2020 INDICATION: Patient with history of non-small cell lung cancer, dyspnea, and left pleural effusion. Request made for diagnostic and therapeutic left thoracentesis EXAM: ULTRASOUND GUIDED DIAGNOSTIC AND THERAPEUTIC LEFT THORACENTESIS MEDICATIONS: 25 mL 1% lidocaine COMPLICATIONS: SIR Level A - No therapy, no consequence. PROCEDURE: An ultrasound guided thoracentesis was thoroughly discussed with the patient and questions answered. The benefits, risks, alternatives and complications were also discussed. The patient understands and wishes to proceed with the procedure. Written consent was obtained. Patient was sitting upright for procedure. Ultrasound was performed to localize and Jlyn an  adequate pocket of fluid in the left chest. The area was then prepped and draped in the normal sterile fashion. 1% Lidocaine was used for local anesthesia. Following this (and before 4 Safe-T-Centesis was introduced), patient stated that he felt lightheaded and had a vasovagal response (unresponsive x5 seconds) -BP 70s/50s during this event. He was placed in reverse Trendelenburg with return of consciousness-BP improvement 110s/70s with this. Patient had resolution of symptoms at this time (denies nausea/vomiting, dizziness, vision changes). Patient wished to proceed with procedure following this. He was placed in a lateral decubitus  position for procedure, no additional events occurred following this. Ultrasound was performed to localize and marked adequate pocket of fluid in the left chest. The area was then prepped and draped in normal sterile fashion. 1% lidocaine was used for local anesthesia. Under ultrasound guidance a 6 Fr Safe-T-Centesis catheter was introduced. Thoracentesis was performed. The catheter was removed and a dressing applied. Dr. Laurence Ferrari made aware. FINDINGS: A total of approximately 1.3 L of clear gold fluid was removed. Samples were sent to the laboratory as requested by the clinical team. IMPRESSION: Successful ultrasound guided left thoracentesis yielding 1.3 L of pleural fluid. Read by: Earley Abide, PA-C Electronically Signed   By: Jacqulynn Cadet M.D.   On: 08/15/2020 12:36    Assessment/Plan Brain Metastases  Mr. Proch is clinically stable today from neurologic standpoint.  MRI unfortunately demonstrates multiple new foci of enhancement (at least 12) consistent with novel metastases.  We will fuse current imaging with prior treatment plans and metastatic foci with consideration given to treatment effect for some of these areas.  New imaging software, per neuroradiology, may also be responsible for "pulling out" some small regions of enhancement that could not previously be visualized.    Right now, we are pending formal evaluation for either clinical trial or targeted therapy through [facility].  We will touch based with him next week after that visit and discussion; there may be potentially CNS penetrant therapeutic option or study available.   If not, we will plan to proceed with radiosurgery if possible.  Case will be re-reviewed in tumor board once images are fused and evaluated more fully.  If uncertainty remains, could consider repeating an MRI in 4-6 weeks and treating lesions which are growing/progressing with tumorigenic behavior.   Case has been discussed with radiation  oncology.  We will touch base with Mr. Iyengar in one week for an update, via phone.  We recommended decreasing Keppra to 500mg  BID due to fatigue associated with this medication, good seizure control.  We appreciate the opportunity to participate in the care of CLYDE ZARRELLA.    All questions were answered. The patient knows to call the clinic with any problems, questions or concerns. No barriers to learning were detected.  The total time spent in the encounter was 40 minutes and more than 50% was on counseling and review of test results   Ventura Sellers, MD Medical Director of Neuro-Oncology [facility] [place] 08/27/20 10:40 AM

## 2020-08-28 ENCOUNTER — Telehealth: Payer: Self-pay | Admitting: Internal Medicine

## 2020-08-28 MED ORDER — LEVETIRACETAM 500 MG PO TABS
ORAL_TABLET | ORAL | 3 refills | Status: DC
Start: 2020-08-28 — End: 2020-10-14

## 2020-08-28 NOTE — Telephone Encounter (Signed)
Scheduled per 11/16 los. Unable to reach pt. Left voicemail with appt time and date.

## 2020-09-02 ENCOUNTER — Telehealth: Payer: Self-pay | Admitting: Internal Medicine

## 2020-09-02 NOTE — Telephone Encounter (Signed)
Spoke with patient regarding appointment for MRA chest ordered by Dr. O'Neal---09/11/20 at 12:00 pm at [facility]---arrival time is 11:30 am--1st floor admissions office for check in----Patient voiced his understanding.

## 2020-09-02 NOTE — Telephone Encounter (Signed)
Contacted patient to verify telephone visit for pre reg °

## 2020-09-03 ENCOUNTER — Ambulatory Visit: Payer: Medicare Other | Admitting: Internal Medicine

## 2020-09-03 ENCOUNTER — Inpatient Hospital Stay (HOSPITAL_BASED_OUTPATIENT_CLINIC_OR_DEPARTMENT_OTHER): Payer: Medicare Other | Admitting: Internal Medicine

## 2020-09-03 ENCOUNTER — Other Ambulatory Visit: Payer: Medicare Other

## 2020-09-03 ENCOUNTER — Ambulatory Visit: Payer: Medicare Other

## 2020-09-03 DIAGNOSIS — C7931 Secondary malignant neoplasm of brain: Secondary | ICD-10-CM

## 2020-09-03 NOTE — Progress Notes (Signed)
I connected with Calvin Wagner on 09/03/20 at  4:00 PM EST by telephone visit and verified that I am speaking with the correct person using two identifiers.  I discussed the limitations, risks, security and privacy concerns of performing an evaluation and management service by telemedicine and the availability of in-person appointments. I also discussed with the patient that there may be a patient responsible charge related to this service. The patient expressed understanding and agreed to proceed.  Other persons participating in the visit and their role in the encounter:  n/a  Patient's location:  Home  Provider's location:  Office  Chief Complaint:  Brain metastasis [facility])  History of Present Ilness: GERHARDT Calvin Wagner presents for follow up after recent visit with medical oncologist at [facility] Dr. Elnoria Howard.  He describes no new or progressive neurologic deficits.  No recent seizures or headaches. Observations: Language and cognition at baseline Assessment and Plan: Brain metastasis ([place])  Follow Up Instructions: Reviewed case with Dr. Elnoria Howard over the phone.  We agree with his recommendation for trial of Temozolomide given poor candidacy for further radiation and lack of other good CNS penentrant systemic options.    We recommended initiating treatment with Temozolomide 200 mg/m2, on for five days and off for twenty three days in twenty eight day cycles. The patient will have a complete blood count performed on days 21 and 28 of each cycle, and a comprehensive metabolic panel performed on day 28 of each cycle. Labs may need to be performed more often. Zofran will prescribed for home use for nausea/vomiting.   Chemotherapy should be held for the following:  ANC less than 1,000  Platelets less than 100,000  LFT or creatinine greater than 2x ULN  If clinical concerns/contraindications develop  Dr. Elnoria Howard will order first cycle and myself or Dr. Julien Nordmann can take over starting with cycle #2.  Brain MRI can be  performed after the second cycle.    He is agreeable with this plan.  I discussed the assessment and treatment plan with the patient.  The patient was provided an opportunity to ask questions and all were answered.  The patient agreed with the plan and demonstrated understanding of the instructions.    The patient was advised to call back or seek an in-person evaluation if the symptoms worsen or if the condition fails to improve as anticipated.  I provided 5-10 minutes of non-face-to-face time during this enocunter.  Ventura Sellers, MD   I provided 15 minutes of non face-to-face telephone visit time during this encounter, and > 50% was spent counseling as documented under my assessment & plan.

## 2020-09-11 ENCOUNTER — Telehealth: Payer: Self-pay | Admitting: Internal Medicine

## 2020-09-11 ENCOUNTER — Ambulatory Visit (HOSPITAL_COMMUNITY)
Admission: RE | Admit: 2020-09-11 | Discharge: 2020-09-11 | Disposition: A | Discharge status: Home / Self Care | Payer: Medicare Other | Source: Ambulatory Visit | Attending: Cardiovascular Disease | Admitting: Cardiovascular Disease

## 2020-09-11 ENCOUNTER — Other Ambulatory Visit: Payer: Self-pay

## 2020-09-11 DIAGNOSIS — I77810 Thoracic aortic ectasia: Secondary | ICD-10-CM | POA: Diagnosis present

## 2020-09-11 MED ORDER — GADOBUTROL 1 MMOL/ML IV SOLN
10.0000 mL | Freq: Once | INTRAVENOUS | Status: AC | PRN
  Administered 2020-09-11: 10 mL via INTRAVENOUS

## 2020-09-11 NOTE — Telephone Encounter (Signed)
Contacted patient to verify phone visit for pre reg °

## 2020-09-12 ENCOUNTER — Inpatient Hospital Stay: Payer: Medicare Other | Attending: Internal Medicine | Admitting: Internal Medicine

## 2020-09-12 DIAGNOSIS — C7931 Secondary malignant neoplasm of brain: Secondary | ICD-10-CM

## 2020-09-12 NOTE — Progress Notes (Signed)
I connected with Calvin Wagner on 09/12/20 at 11:00 AM EST by telephone visit and verified that I am speaking with the correct person using two identifiers.  I discussed the limitations, risks, security and privacy concerns of performing an evaluation and management service by telemedicine and the availability of in-person appointments. I also discussed with the patient that there may be a patient responsible charge related to this service. The patient expressed understanding and agreed to proceed.  Other persons participating in the visit and their role in the encounter:  spouse  Patient's location:  Home  Provider's location:  Office  Chief Complaint:  Brain metastasis ([place]) - Plan: CBC with Differential ([place] Only), CMP ([place] only)  History of Present Ilness: Calvin Wagner is clinically unchanged from prior visit.  He is comfortable moving forward with plan for 5-day Temodar for refractory brain metastases. Observations: Language and cognition stable  Assessment and Plan: Brain metastasis ([place]) - Plan: CBC with Differential ([facility] Only), CMP ([place] only)  We recommended initiating treatment with Temozolomide 200 mg/m2, on for five days and off for twenty three days in twenty eight day cycles. The patient will have a complete blood count performed on days 21 and 28 of each cycle, and a comprehensive metabolic panel performed on day 28 of each cycle. Labs may need to be performed more often. Zofran will prescribed for home use for nausea/vomiting.   Chemotherapy should be held for the following:  ANC less than 1,000  Platelets less than 100,000  LFT or creatinine greater than 2x ULN  If clinical concerns/contraindications develop  Follow Up Instructions: RTC in 5 weeks prior to cycle #2.  I discussed the assessment and treatment plan with the patient.  The patient was provided an opportunity to ask questions and all were answered.  The patient agreed  with the plan and demonstrated understanding of the instructions.    The patient was advised to call back or seek an in-person evaluation if the symptoms worsen or if the condition fails to improve as anticipated.  I provided 5-10 minutes of non-face-to-face time during this enocunter.  Ventura Sellers, MD   I provided 15 minutes of non face-to-face telephone visit time during this encounter, and > 50% was spent counseling as documented under my assessment & plan.

## 2020-09-16 ENCOUNTER — Other Ambulatory Visit: Payer: Self-pay | Admitting: *Deleted

## 2020-09-16 MED ORDER — ONDANSETRON HCL 8 MG PO TABS: 8.0000 mg | ORAL_TABLET | Freq: Three times a day (TID) | ORAL | 0 refills | Status: AC | PRN

## 2020-09-19 ENCOUNTER — Other Ambulatory Visit: Payer: Medicare Other

## 2020-09-23 ENCOUNTER — Ambulatory Visit: Payer: Medicare Other | Admitting: Internal Medicine

## 2020-10-03 ENCOUNTER — Encounter: Payer: Self-pay | Admitting: Cardiovascular Disease

## 2020-10-03 ENCOUNTER — Telehealth (INDEPENDENT_AMBULATORY_CARE_PROVIDER_SITE_OTHER): Payer: Medicare Other | Admitting: Cardiovascular Disease

## 2020-10-03 VITALS — BP 132/90 | HR 82 | Ht 73.0 in | Wt 240.0 lb

## 2020-10-03 DIAGNOSIS — I10 Essential (primary) hypertension: Secondary | ICD-10-CM | POA: Diagnosis not present

## 2020-10-03 DIAGNOSIS — I77810 Thoracic aortic ectasia: Secondary | ICD-10-CM

## 2020-10-03 DIAGNOSIS — I480 Paroxysmal atrial fibrillation: Secondary | ICD-10-CM | POA: Diagnosis not present

## 2020-10-03 NOTE — Progress Notes (Signed)
Virtual Visit via Telephone Note   This visit type was conducted due to national recommendations for restrictions regarding the COVID-19 Pandemic (e.g. social distancing) in an effort to limit this patient's exposure and mitigate transmission in our community.  Due to his co-morbid illnesses, this patient is at least at moderate risk for complications without adequate follow up.  This format is felt to be most appropriate for this patient at this time.  The patient did not have access to video technology/had technical difficulties with video requiring transitioning to audio format only (telephone).  All issues noted in this document were discussed and addressed.  No physical exam could be performed with this format.  Please refer to the patient's chart for his  consent to telehealth for [facility].   Date:  10/03/2020   ID:  Calvin Wagner, DOB [birth year], MRN [id number] The patient was identified using 2 identifiers.  Patient Location: Home Provider Location: Office/Clinic  PCP:  [name], MD  Cardiologist:  No primary care provider on file.  Electrophysiologist:  None   Evaluation Performed:  Follow-Up Visit  Chief Complaint:  Atrial fibrillation  History of Present Illness:    Calvin Wagner is a 54 y.o. male with metastatic lung CA, paroxysmal atrial fibrillation, HLD who presents for follow-up. Had aortic root dilation on echo. 33 mm on MRA.  He reports he is doing well.  He has had no rapid heartbeat sensation.  He reports he still a bit fatigued but he is also on chemotherapy.  He denies any chest pain or shortness of breath.  We did go over the results of his MRI which showed no evidence of aortic aneurysm.  He is on anticoagulation for pulmonary embolism.  We did start him on metoprolol.  I did discuss with his wife that he should look into a cardia mobile device.  He has not purchased one yet.  I did encourage him to look into 1 of these devices.  It would be good for him  to periodically monitor his heart rhythm.  Overall, doing well.  Denies any chest pain, shortness of breath or palpitations.  Problem List 1. 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 HTN 3.  Sinus tachycardia -TSH 2.23 -Recent chest CT with 0 coronary calcium 4.  Cholesterol 2017: Total 277, HDL 70, LDL 187, triglycerides 103 5. Atrial fibrillation, paroxysmal  -<1% -8.6% PAC burden -CHADSVASC=1 (HTN) 6.  Pulmonary embolism -On Xarelto  The patient does not have symptoms concerning for COVID-19 infection (fever, chills, cough, or new shortness of breath).    Past Medical History:  Diagnosis Date  . Encounter for antineoplastic chemotherapy 12/31/2016  . GERD 11/08/2009   Qualifier: Diagnosis of  By: Ronnald Ramp MD, Arvid Right Goals of care, counseling/discussion 12/31/2016  . History of radiation therapy 01/13/2017 - 02/02/2017   Site/dose:  Left lung: 30 Gy in 15 fractions  . HYPERTENSION 11/08/2009   Qualifier: Diagnosis of  By: Ronnald Ramp MD, Arvid Right.   . 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 Migraine 02/25/2012  . Pneumonia   . PONV (postoperative nausea and vomiting)   . Rotator cuff tear, left    Past Surgical History:  Procedure Laterality Date  . INGUINAL HERNIA REPAIR    . SHOULDER ARTHROSCOPY WITH ROTATOR CUFF REPAIR Left 11/12/2016   Procedure: SHOULDER ARTHROSCOPY WITH ROTATOR CUFF REPAIR AND SUBACROMIAL DECOMPRESSION;  Surgeon: Tania Ade, MD;  Location: [place];  Service: Orthopedics;  Laterality: Left;  SHOULDER ARTHROSCOPY WITH ROTATOR CUFF REPAIR AND SUBACROMIAL DECOMPRESSION  . TONSILLECTOMY    . TOTAL KNEE ARTHROPLASTY    . VIDEO BRONCHOSCOPY Bilateral 12/10/2016   Procedure: VIDEO BRONCHOSCOPY WITHOUT FLUORO;  Surgeon: Tanda Rockers, MD;  Location: [facility];  Service: Cardiopulmonary;  Laterality: Bilateral;     Current Meds  Medication Sig  . amLODipine (NORVASC) 5 MG tablet Take 5 mg by mouth daily.  .  chlorpheniramine-HYDROcodone (TUSSIONEX) 10-8 MG/5ML SUER Take 5 mLs by mouth every 12 (twelve) hours as needed for cough.  . dexamethasone (DECADRON) 2 MG tablet Take 1 tablet (2 mg total) by mouth daily.  . famotidine (PEPCID) 20 MG tablet Take 20 mg by mouth 2 (two) times daily.  . folic acid (FOLVITE) 1 MG tablet Take 1 tablet (1 mg total) by mouth daily.  Marland Kitchen levETIRAcetam (KEPPRA) 500 MG tablet TAKE 1 TABLET(500 MG) BY MOUTH TWICE DAILY  . LORazepam (ATIVAN) 1 MG tablet TAKE ONE TAB BY MOUTH 30 MINUTES PRIOR TO MRI OR RADIOTHERAPY.  Marland Kitchen losartan (COZAAR) 50 MG tablet Take 50 mg by mouth daily.  . magic mouthwash SOLN Take 5 mLs by mouth 4 (four) times daily as needed for mouth pain. 1:1:1 mix-Nystatin. Benadryl and extra strength maalox.  . metoprolol succinate (TOPROL XL) 25 MG 24 hr tablet Take 1 tablet (25 mg total) by mouth daily.  . Multiple Vitamin (MULTIVITAMIN WITH MINERALS) TABS tablet Take 1 tablet by mouth daily.  . ondansetron (ZOFRAN) 8 MG tablet TAKE 1 TABLET BY MOUTH EVERY 8 HOURS AS NEEDED FOR NAUSEA OR VOMITING.  . ondansetron (ZOFRAN) 8 MG tablet Take 1 tablet (8 mg total) by mouth every 8 (eight) hours as needed for nausea or vomiting. Take 1 tablet 30-60 minutes prior to Temodar dose.  Marland Kitchen PROAIR HFA 108 (90 Base) MCG/ACT inhaler Inhale 2 puffs into the lungs every 4 (four) hours as needed.  . prochlorperazine (COMPAZINE) 10 MG tablet TAKE 1 TABLET BY MOUTH EVERY 6 HOURS AS NEEDED FOR NAUSEA OR VOMITING  . sucralfate (CARAFATE) 1 g tablet Crush and mix in 1 oz water and drink 5 min TID before meals and at HS for radiation induced esophagitis  . XARELTO 20 MG TABS tablet TAKE 1 TABLET(20 MG) BY MOUTH DAILY WITH SUPPER     Allergies:   Patient has no known allergies.   Social History   Tobacco Use  . Smoking status: Never Smoker  . Smokeless tobacco: Never Used  Vaping Use  . Vaping Use: Never used  Substance Use Topics  . Alcohol use: Yes    Comment: social  . Drug  use: No     Family Hx: The patient's family history includes CAD in an other family member; Heart attack (age of onset: 34) in his brother; Heart disease (age of onset: 23) in his mother; Lung cancer in an other family member; Mental retardation in an other family member. There is no history of Alcohol abuse, Diabetes, Early death, Hyperlipidemia, Hypertension, Kidney disease, Stroke, or Migraines.  ROS:   Please see the history of present illness.     All other systems reviewed and are negative.  Prior CV studies:   The following studies were reviewed today:  TTE 07/30/2020 1. Left ventricular ejection fraction, by estimation, is 60 to 65%. The  left ventricle has normal function. The left ventricle has no regional  wall motion abnormalities. Left ventricular diastolic parameters are  consistent with Grade I diastolic  dysfunction (impaired relaxation). The average left ventricular global  longitudinal strain is 16.1 %. The global longitudinal strain is normal.  2. Right ventricular systolic function is normal. The right ventricular  size is normal.  3. The mitral valve is normal in structure. No evidence of mitral valve  regurgitation. No evidence of mitral stenosis.  4. The aortic valve is tricuspid. Aortic valve regurgitation is not  visualized. No aortic stenosis is present.  5. Aortic dilatation noted. There is mild dilatation at the level of the  sinuses of Valsalva, measuring 44 mm.  6. The inferior vena cava is normal in size with greater than 50%  respiratory variability, suggesting right atrial pressure of 3 mmHg.   [place] 08/02/2020  Impression: 1. Paroxysmal atrial fibrillation detected (8 episodes; <1% burden; longest episode 1 hour; 5 min). 2. Frequent PACs (8.6% burden).    Labs/Other Tests and Data Reviewed:    EKG:  No ECG reviewed.  Recent Labs: 08/27/2020: ALT 30; BUN 11; Creatinine 1.27; Hemoglobin 14.7; Platelet Count 299; Potassium 4.1; Sodium  141; TSH 4.044   Recent Lipid Panel No results found for: CHOL, TRIG, HDL, CHOLHDL, LDLCALC, LDLDIRECT  Wt Readings from Last 3 Encounters:  10/03/20 240 lb (108.9 kg)  08/27/20 243 lb 8 oz (110.5 kg)  08/06/20 246 lb 11.2 oz (111.9 kg)     Objective:    Vital Signs:  BP 132/90   Pulse 82   Ht 6\' 1"  (1.854 m)   Wt 240 lb (108.9 kg)   SpO2 94%   BMI 31.66 kg/m    VITAL SIGNS:  reviewed  General: No acute distress by phone Pulmonary: Talking in complete sentences, no shortness of breath Psych: Normal mood and affect  ASSESSMENT & PLAN:    1. Paroxysmal atrial fibrillation (HCC) -Recent monitor with paroxysmal atrial fibrillation.  Less than 1% A. fib burden.  He will continue metoprolol succinate.  He is on anticoagulation for pulmonary embolism.  He will continue anticoagulation for this.  His chads vas score does not merit anticoagulation, but since he is already on a he will continue this. -We discussed the cardia mobile device.  He will look into this versus an apple watch.  He should periodically monitor his heart rhythm.  2. Aortic root dilation (HCC) -No evidence of aortic aneurysm on MRA.  Suspect this was over called on echo.  No further work-up needed.  3. Essential hypertension -Well-controlled per his report.  COVID-19 Education: The signs and symptoms of COVID-19 were discussed with the patient and how to seek care for testing (follow up with PCP or arrange E-visit).  The importance of social distancing was discussed today.  Time:  Today, I have spent 25 minutes with the patient with telehealth technology discussing the above problems.    Medication Adjustments/Labs and Tests Ordered: Current medicines are reviewed at length with the patient today.  Concerns regarding medicines are outlined above.   Tests Ordered: No orders of the defined types were placed in this encounter.   Medication Changes: No orders of the defined types were placed in this  encounter.   Follow Up:  In Person in 6 month(s)  Signed, Evalina Field, MD  10/03/2020 3:07 PM    [facility]

## 2020-10-03 NOTE — Patient Instructions (Signed)
Medication Instructions:  The current medical regimen is effective;  continue present plan and medications.  *If you need a refill on your cardiac medications before your next appointment, please call your pharmacy*   Follow-Up: At [facility], you and your health needs are our priority.  As part of our continuing mission to provide you with exceptional heart care, we have created designated Provider Care Teams.  These Care Teams include your primary Cardiologist (physician) and Advanced Practice Providers (APPs -  Physician Assistants and Nurse Practitioners) who all work together to provide you with the care you need, when you need it.  We recommend signing up for the patient portal called "MyChart".  Sign up information is provided on this After Visit Summary.  MyChart is used to connect with patients for Virtual Visits (Telemedicine).  Patients are able to view lab/test results, encounter notes, upcoming appointments, etc.  Non-urgent messages can be sent to your provider as well.   To learn more about what you can do with MyChart, go to [email or web address].    Your next appointment:   6 month(s)  The format for your next appointment:   In Person  Provider:   Hagerman O'Neal, MD      

## 2020-10-07 ENCOUNTER — Encounter: Payer: Self-pay | Admitting: Internal Medicine

## 2020-10-14 ENCOUNTER — Inpatient Hospital Stay (HOSPITAL_BASED_OUTPATIENT_CLINIC_OR_DEPARTMENT_OTHER): Payer: Medicare Other | Admitting: Internal Medicine

## 2020-10-14 ENCOUNTER — Other Ambulatory Visit: Payer: Self-pay

## 2020-10-14 ENCOUNTER — Inpatient Hospital Stay: Payer: Medicare Other | Attending: Internal Medicine

## 2020-10-14 ENCOUNTER — Other Ambulatory Visit: Payer: Self-pay | Admitting: *Deleted

## 2020-10-14 VITALS — BP 126/75 | HR 104 | Temp 97.5°F | Resp 18 | Ht 73.0 in | Wt 241.5 lb

## 2020-10-14 DIAGNOSIS — C7931 Secondary malignant neoplasm of brain: Secondary | ICD-10-CM | POA: Insufficient documentation

## 2020-10-14 DIAGNOSIS — C7A1 Malignant poorly differentiated neuroendocrine tumors: Secondary | ICD-10-CM | POA: Insufficient documentation

## 2020-10-14 DIAGNOSIS — C3490 Malignant neoplasm of unspecified part of unspecified bronchus or lung: Secondary | ICD-10-CM

## 2020-10-14 DIAGNOSIS — C3432 Malignant neoplasm of lower lobe, left bronchus or lung: Secondary | ICD-10-CM | POA: Diagnosis present

## 2020-10-14 DIAGNOSIS — R569 Unspecified convulsions: Secondary | ICD-10-CM

## 2020-10-14 DIAGNOSIS — Z79899 Other long term (current) drug therapy: Secondary | ICD-10-CM | POA: Insufficient documentation

## 2020-10-14 DIAGNOSIS — R5383 Other fatigue: Secondary | ICD-10-CM

## 2020-10-14 DIAGNOSIS — R519 Headache, unspecified: Secondary | ICD-10-CM | POA: Insufficient documentation

## 2020-10-14 LAB — CMP (CANCER CENTER ONLY)
ALT: 18 U/L (ref 0–44)
AST: 18 U/L (ref 15–41)
Albumin: 3.4 g/dL — ABNORMAL LOW (ref 3.5–5.0)
Alkaline Phosphatase: 92 U/L (ref 38–126)
Anion gap: 6 (ref 5–15)
BUN: 6 mg/dL (ref 6–20)
CO2: 27 mmol/L (ref 22–32)
Calcium: 9.4 mg/dL (ref 8.9–10.3)
Chloride: 104 mmol/L (ref 98–111)
Creatinine: 1.37 mg/dL — ABNORMAL HIGH (ref 0.61–1.24)
GFR, Estimated: >60 mL/min (ref >60)
Glucose, Bld: 175 mg/dL — ABNORMAL HIGH (ref 70–99)
Potassium: 4.6 mmol/L (ref 3.5–5.1)
Sodium: 137 mmol/L (ref 135–145)
Total Bilirubin: 0.5 mg/dL (ref 0.3–1.2)
Total Protein: 6.9 g/dL (ref 6.5–8.1)

## 2020-10-14 LAB — CBC WITH DIFFERENTIAL (CANCER CENTER ONLY)
Abs Immature Granulocytes: 0.01 10*3/uL (ref 0.00–0.07)
Basophils Absolute: 0 10*3/uL (ref 0.0–0.1)
Basophils Relative: 0 %
Eosinophils Absolute: 0.2 10*3/uL (ref 0.0–0.5)
Eosinophils Relative: 4 %
HCT: 46.6 % (ref 39.0–52.0)
Hemoglobin: 16 g/dL (ref 13.0–17.0)
Immature Granulocytes: 0 %
Lymphocytes Relative: 12 %
Lymphs Abs: 0.6 10*3/uL — ABNORMAL LOW (ref 0.7–4.0)
MCH: 33.3 pg (ref 26.0–34.0)
MCHC: 34.3 g/dL (ref 30.0–36.0)
MCV: 97.1 fL (ref 80.0–100.0)
Monocytes Absolute: 0.5 10*3/uL (ref 0.1–1.0)
Monocytes Relative: 11 %
Neutro Abs: 3.4 10*3/uL (ref 1.7–7.7)
Neutrophils Relative %: 73 %
Platelet Count: 235 10*3/uL (ref 150–400)
RBC: 4.8 MIL/uL (ref 4.22–5.81)
RDW: 12.9 % (ref 11.5–15.5)
WBC Count: 4.7 10*3/uL (ref 4.0–10.5)
nRBC: 0 % (ref 0.0–0.2)

## 2020-10-14 LAB — TSH: TSH: 1.863 u[IU]/mL (ref 0.320–4.118)

## 2020-10-14 MED ORDER — DEXAMETHASONE 1 MG PO TABS: 1.0000 mg | ORAL_TABLET | Freq: Every day | ORAL | Status: DC

## 2020-10-14 NOTE — Progress Notes (Signed)
[facility] [address], [place] [address] [phone]   Interval Evaluation  Date of Service: 10/14/20 Patient Name: Calvin Wagner Patient MRN: [id number] Patient DOB: [birth year] Provider: Ventura Sellers, MD  Identifying Statement:  Calvin Wagner is a 55 y.o. male with Brain metastasis ([place]) [C79.31]   Primary Cancer:  Oncologic History: Oncology History Overview Note  Patient presented with onset of cough then followed with scans.       Cancer ([place]) (Resolved)  11/25/2016 Initial Diagnosis   Cancer ([place])   11/25/2016 Imaging   CTA IMPRESSION: 1. Multiple bilateral acute pulmonary emboli, segmental to subsegmental size. 2. 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 There is complete obstruction of the lingular and left lower lobe airways. 1 cm pulmonary nodule in the left lateral costophrenic sulcus.   11/26/2016 Imaging   CT Abdomen No mass or adenopathy in the abdomen or pelvis   12/10/2016 Surgery   Operation: Video Flexible fiberoptic bronchoscopy, diagnostic    12/17/2016 Imaging   PET IMPRESSION: 1. Extensive left hilar and mediastinal all hypermetabolic lymphadenopathy, compatible with underlying malignancy. These findings could reflect either primary lymphoma or bronchogenic carcinoma such as a small cell carcinoma. 2. There is also a small left lower lobe pulmonary nodule measuring 11 mm which is mildly hypermetabolic. This could potentially represent the primary bronchogenic neoplasm, but it is un usual for a small nodule of this size to result in such extensive lymphadenopathy.   01/02/2017 Imaging   MRI Brain IMPRESSION:  Positive for a solitary 13 mm rim enhancing metastasis in the left anterior inferior frontal gyrus (series 10, image 72. No associated edema or mass effect.   01/04/2017 -  Radiation Therapy   SIM Chest    01/11/2017 -  Chemotherapy   The  patient had palonosetron (ALOXI) injection 0.25 mg, 0.25 mg, Intravenous,  Once, 1 of 7 cycles  CARBOplatin (PARAPLATIN) 280 mg in sodium chloride 0.9 % 250 mL chemo infusion, 280 mg (100 % of original dose 281.6 mg), Intravenous,  Once, 1 of 7 cycles Dose modification: 281.6 mg (original dose 281.6 mg, Cycle 1)  PACLitaxel (TAXOL) 102 mg in dextrose 5 % 250 mL chemo infusion ( for chemotherapy treatment.     Large cell carcinoma of left lung, stage 4 (HCC)  12/31/2016 Initial Diagnosis   Large cell carcinoma of left lung, stage 4 ([place])   10/27/2018 - 07/09/2020 Chemotherapy   The patient had nivolumab (OPDIVO) 480 mg in sodium chloride 0.9 % 100 mL chemo infusion, 480 mg, Intravenous, Once, 23 of 28 cycles Administration: 480 mg (10/27/2018), 480 mg (11/24/2018), 480 mg (12/22/2018), 480 mg (01/19/2019), 480 mg (02/16/2019), 480 mg (03/16/2019), 480 mg (04/10/2019), 480 mg (05/11/2019), 480 mg (06/08/2019), 480 mg (07/05/2019), 480 mg (08/02/2019), 480 mg (08/30/2019), 480 mg (09/29/2019), 480 mg (10/25/2019), 480 mg (11/22/2019), 480 mg (12/20/2019), 480 mg (01/25/2020), 480 mg (02/22/2020), 480 mg (03/21/2020), 480 mg (04/17/2020), 480 mg (05/15/2020), 480 mg (06/11/2020), 480 mg (07/09/2020)  for chemotherapy treatment.     CNS Oncologic History 01/25/17: SRS Treatment PTV1Left Frontal 73mm: 20 Gy in 1 fraction 10/14/18: Whole Brain / 30 Gy in 10 fractions 11/03/19: SRS L temporal 20Gy ([place]) 02/16/20: SRS to R parietal and cerebellar vermis targets 06/19/20: SRS to 2 additional metastases 09/03/20: Further progression; begins salvage therapy with 5-day TMZ cycle #1  Interval History:  Calvin Wagner presents to clinic for evaluation following first cycle of  Temodar.  He tolerated therapy well without complication.  Still complains of occassional dizziness upon standing, self limited, not worse from prior.  No issues with gait, motor funtion.  Denies seizures.  Headaches continue to occur in mild moderate  fashion at times.    No further seizures or seizure auras since prior visit.  Currently on tapering dose of decadron for skin rash, thought to be drug reaction. Unfortunately is progressive systemically and recently had evaluation for clinical trial at [facility]. He remains functionally independent and intact otherwise.  No issues with gait or cognition.  Complains of "always feeling tired" after dosing Keppra each day.  Medications: Current Outpatient Medications on File Prior to Visit  Medication Sig Dispense Refill  . amLODipine (NORVASC) 5 MG tablet Take 5 mg by mouth daily.    . chlorpheniramine-HYDROcodone (TUSSIONEX) 10-8 MG/5ML SUER Take 5 mLs by mouth every 12 (twelve) hours as needed for cough. 240 mL 0  . dexamethasone (DECADRON) 2 MG tablet Take 1 tablet (2 mg total) by mouth daily.    . famotidine (PEPCID) 20 MG tablet Take 20 mg by mouth 2 (two) times daily.    . folic acid (FOLVITE) 1 MG tablet Take 1 tablet (1 mg total) by mouth daily. 30 tablet 4  . levETIRAcetam (KEPPRA) 500 MG tablet TAKE 1 TABLET(500 MG) BY MOUTH TWICE DAILY 60 tablet 3  . LORazepam (ATIVAN) 1 MG tablet TAKE ONE TAB BY MOUTH 30 MINUTES PRIOR TO MRI OR RADIOTHERAPY. 30 tablet 0  . losartan (COZAAR) 50 MG tablet Take 50 mg by mouth daily.    . magic mouthwash SOLN Take 5 mLs by mouth 4 (four) times daily as needed for mouth pain. 1:1:1 mix-Nystatin. Benadryl and extra strength maalox. 240 mL 0  . metoprolol succinate (TOPROL XL) 25 MG 24 hr tablet Take 1 tablet (25 mg total) by mouth daily. 90 tablet 3  . Multiple Vitamin (MULTIVITAMIN WITH MINERALS) TABS tablet Take 1 tablet by mouth daily.    . ondansetron (ZOFRAN) 8 MG tablet TAKE 1 TABLET BY MOUTH EVERY 8 HOURS AS NEEDED FOR NAUSEA OR VOMITING. 90 tablet 0  . ondansetron (ZOFRAN) 8 MG tablet Take 1 tablet (8 mg total) by mouth every 8 (eight) hours as needed for nausea or vomiting. Take 1 tablet 30-60 minutes prior to Temodar dose. 20 tablet 0  . PROAIR  HFA 108 (90 Base) MCG/ACT inhaler Inhale 2 puffs into the lungs every 4 (four) hours as needed. 1 each 5  . prochlorperazine (COMPAZINE) 10 MG tablet TAKE 1 TABLET BY MOUTH EVERY 6 HOURS AS NEEDED FOR NAUSEA OR VOMITING 90 tablet 0  . sucralfate (CARAFATE) 1 g tablet Crush and mix in 1 oz water and drink 5 min TID before meals and at HS for radiation induced esophagitis 120 tablet 2  . XARELTO 20 MG TABS tablet TAKE 1 TABLET(20 MG) BY MOUTH DAILY WITH SUPPER 30 tablet 2   No current facility-administered medications on file prior to visit.    Allergies: No Known Allergies Past Medical History:  Past Medical History:  Diagnosis Date  . Encounter for antineoplastic chemotherapy 12/31/2016  . GERD 11/08/2009   Qualifier: Diagnosis of  By: Ronnald Ramp MD, Arvid Right Goals of care, counseling/discussion 12/31/2016  . History of radiation therapy 01/13/2017 - 02/02/2017   Site/dose:  Left lung: 30 Gy in 15 fractions  . HYPERTENSION 11/08/2009   Qualifier: Diagnosis of  By: Ronnald Ramp MD, Arvid Right.   . Large  cell carcinoma of left lung, stage 4 ([place]) 12/31/2016  . Migraine 02/25/2012  . Pneumonia   . PONV (postoperative nausea and vomiting)   . Rotator cuff tear, left    Past Surgical History:  Past Surgical History:  Procedure Laterality Date  . INGUINAL HERNIA REPAIR    . SHOULDER ARTHROSCOPY WITH ROTATOR CUFF REPAIR Left 11/12/2016   Procedure: SHOULDER ARTHROSCOPY WITH ROTATOR CUFF REPAIR AND SUBACROMIAL DECOMPRESSION;  Surgeon: Tania Ade, MD;  Location: [place];  Service: Orthopedics;  Laterality: Left;  SHOULDER ARTHROSCOPY WITH ROTATOR CUFF REPAIR AND SUBACROMIAL DECOMPRESSION  . TONSILLECTOMY    . TOTAL KNEE ARTHROPLASTY    . VIDEO BRONCHOSCOPY Bilateral 12/10/2016   Procedure: VIDEO BRONCHOSCOPY WITHOUT FLUORO;  Surgeon: Tanda Rockers, MD;  Location: [facility];  Service: Cardiopulmonary;  Laterality: Bilateral;   Social History:  Social History   Socioeconomic History  . Marital  status: Married    Spouse name: Baker Janus  . Number of children: 2  . Years of education: Not on file  . Highest education level: Not on file  Occupational History  . Occupation: Material Therapist, sports: [employer]  Tobacco Use  . Smoking status: Never Smoker  . Smokeless tobacco: Never Used  Vaping Use  . Vaping Use: Never used  Substance and Sexual Activity  . Alcohol use: Yes    Comment: social  . Drug use: No  . Sexual activity: Not Currently  Other Topics Concern  . Not on file  Social History Narrative   Lives at home wife and 2 kids   Caffeine use: none     Social Determinants of Health   Financial Resource Strain: Not on file  Food Insecurity: Not on file  Transportation Needs: Not on file  Physical Activity: Not on file  Stress: Not on file  Social Connections: Not on file  Intimate Partner Violence: Not on file   Family History:  Family History  Problem Relation Age of Onset  . Heart disease Mother 42  . Lung cancer Other        family history  . CAD Other        strong family history male and male <50  . Mental retardation Other   . Heart attack Brother 74  . Alcohol abuse Neg Hx   . Diabetes Neg Hx   . Early death Neg Hx   . Hyperlipidemia Neg Hx   . Hypertension Neg Hx   . Kidney disease Neg Hx   . Stroke Neg Hx   . Migraines Neg Hx     Review of Systems: Constitutional: Denies fevers, chills or abnormal weight loss Eyes: Denies blurriness of vision Ears, nose, mouth, throat, and face: Denies mucositis or sore throat Respiratory: Denies cough, dyspnea or wheezes Cardiovascular: Denies palpitation, chest discomfort or lower extremity swelling Gastrointestinal:  Denies nausea, constipation, diarrhea GU: Denies dysuria or incontinence Skin: Denies abnormal skin rashes Neurological: Per HPI Musculoskeletal: Denies joint pain, back or neck discomfort. No decrease in ROM Behavioral/Psych: Denies anxiety, disturbance in thought content,  and mood instability   Physical Exam: Vitals with BMI 10/03/2020 08/27/2020 08/15/2020  Height 6\' 1"  6\' 1"  -  Weight 240 lbs 243 lbs 8 oz -  BMI 34.19 62.22 -  Systolic 979 892 119  Diastolic 90 77 76  Pulse 82 95 -   KPS: 90. General: Alert, cooperative, pleasant, in no acute distress Head: Normal EENT: No conjunctival injection or scleral icterus. Oral mucosa moist  Lungs: Resp effort normal Cardiac: Regular rate and rhythm Abdomen: Soft, non-distended abdomen Skin: Dry rash on face, ears.  Extremities: No clubbing or edema  Neurologic Exam: Mental Status: Awake, alert, attentive to examiner. Oriented to self and environment. Language is fluent with intact comprehension.  Cranial Nerves: Visual acuity is grossly normal. Visual fields are full. Extra-ocular movements intact. No ptosis. Face is symmetric, tongue midline. Motor: Tone and bulk are normal. Power is full in both arms and legs. Reflexes are symmetric, no pathologic reflexes present. Intact finger to nose bilaterally Sensory: Intact to light touch and temperature Gait: Normal    Labs: I have reviewed the data as listed    Component Value Date/Time   NA 141 08/27/2020 0858   NA 138 10/08/2017 0907   K 4.1 08/27/2020 0858   K 4.1 10/08/2017 0907   CL 104 08/27/2020 0858   CO2 28 08/27/2020 0858   CO2 24 10/08/2017 0907   GLUCOSE 195 (H) 08/27/2020 0858   GLUCOSE 138 10/08/2017 0907   BUN 11 08/27/2020 0858   BUN 11.7 10/08/2017 0907   CREATININE 1.27 (H) 08/27/2020 0858   CREATININE 1.2 10/08/2017 0907   CALCIUM 9.2 08/27/2020 0858   CALCIUM 9.5 10/08/2017 0907   PROT 6.9 08/27/2020 0858   PROT 7.5 10/08/2017 0907   ALBUMIN 3.4 (L) 08/27/2020 0858   ALBUMIN 3.6 10/08/2017 0907   AST 18 08/27/2020 0858   AST 22 10/08/2017 0907   ALT 30 08/27/2020 0858   ALT 24 10/08/2017 0907   ALKPHOS 83 08/27/2020 0858   ALKPHOS 88 10/08/2017 0907   BILITOT 0.8 08/27/2020 0858   BILITOT 0.42 10/08/2017 0907    GFRNONAA >60 08/27/2020 0858   GFRAA >60 07/09/2020 0931   Lab Results  Component Value Date   WBC 4.7 10/14/2020   NEUTROABS 3.4 10/14/2020   HGB 16.0 10/14/2020   HCT 46.6 10/14/2020   MCV 97.1 10/14/2020   PLT 235 10/14/2020    Assessment/Plan Brain Metastases  Mr. Rash is clinically stable now having completed 1st cycle of 5-day TMZ for refractory and recurrent brain metastases.  Labs are within normal limits.    We recommended continuing treatment with cycle #2 Temozolomide 200 mg/m2, on for five days and off for twenty three days in twenty eight day cycles. The patient will have a complete blood count performed on days 21 and 28 of each cycle, and a comprehensive metabolic panel performed on day 28 of each cycle. Labs may need to be performed more often. Zofran will prescribed for home use for nausea/vomiting.   Chemotherapy should be held for the following:  ANC less than 1,000  Platelets less than 100,000  LFT or creatinine greater than 2x ULN  If clinical concerns/contraindications develop  We recommended decreasing Keppra to 500mg  BID due to fatigue associated with this medication, good seizure control.  We are ok with initiation of low dose decadron, 1mg  daily, for skin lesions and headaches, as this had helped previously.  We appreciate the opportunity to participate in the care of Calvin Wagner.    We ask that Calvin Wagner return to clinic in 1 months following next brain MRI, or sooner as needed.  All questions were answered. The patient knows to call the clinic with any problems, questions or concerns. No barriers to learning were detected.  The total time spent in the encounter was 30 minutes and more than 50% was on counseling and review of test results   Alroy Dust  Harold Hedge, MD Medical Director of Neuro-Oncology [facility] at [place] 10/14/20 11:49 AM

## 2020-10-16 ENCOUNTER — Other Ambulatory Visit: Payer: Self-pay | Admitting: Radiation Therapy

## 2020-10-24 ENCOUNTER — Encounter: Payer: Self-pay | Admitting: Internal Medicine

## 2020-10-30 ENCOUNTER — Encounter: Payer: Self-pay | Admitting: Internal Medicine

## 2020-10-31 ENCOUNTER — Other Ambulatory Visit: Payer: Self-pay | Admitting: Medical Oncology

## 2020-10-31 ENCOUNTER — Telehealth: Payer: Self-pay

## 2020-10-31 NOTE — Telephone Encounter (Signed)
Patient's wife called office stating patient had tested positive for Covid using a home rapid test.  Per previous message from Dr. Mickeal Skinner and Dr. Worthy Flank RN, patient will need to follow-up with Dr. Elnoria Howard at [facility] to see how to proceed, as the patient is currently under oncology care with Dr. Elnoria Howard.  Informed patient's wife of the need for follow-up with Dr. Guinevere Ferrari office. Wife verbalized understanding.

## 2020-11-05 ENCOUNTER — Telehealth: Payer: Self-pay

## 2020-11-05 NOTE — Telephone Encounter (Signed)
Patient's wife called stating patient's MRI needed to be reschedule due to patient testing positive for COVID on 10/31/20.  Patient's wife called GI to reschedule MRI  to 11/12/20. Scheduling message sent to reschedule lab and MD follow-up to align with currently [place] with Covid positive patient.  Message sent to [facility] to have patient reschedule for tumor board.

## 2020-11-06 ENCOUNTER — Telehealth: Payer: Self-pay | Admitting: Cardiovascular Disease

## 2020-11-06 ENCOUNTER — Telehealth: Payer: Self-pay | Admitting: Internal Medicine

## 2020-11-06 MED ORDER — METOPROLOL SUCCINATE ER 25 MG PO TB24: 25.0000 mg | ORAL_TABLET | Freq: Every day | ORAL | 1 refills | Status: AC

## 2020-11-06 NOTE — Telephone Encounter (Signed)
*  STAT* If patient is at the pharmacy, call can be transferred to refill team.   1. Which medications need to be refilled? (please list name of each medication and dose if known)  metoprolol succinate (TOPROL XL) 25 MG 24 hr tablet  2. Which pharmacy/location (including street and city if local pharmacy) is medication to be sent to? [facility], [address], [address]  3. Do they need a 30 day or 90 day supply? [place]

## 2020-11-06 NOTE — Telephone Encounter (Signed)
Rx has been sent to the pharmacy electronically. ° °

## 2020-11-06 NOTE — Telephone Encounter (Signed)
This is Dr. Kathalene Frames pt

## 2020-11-06 NOTE — Telephone Encounter (Signed)
Rescheduled follow-up appointment per 1/25 schedule message. Patient is aware of changes.

## 2020-11-07 ENCOUNTER — Other Ambulatory Visit: Payer: Medicare Other

## 2020-11-11 ENCOUNTER — Other Ambulatory Visit: Payer: Medicare Other

## 2020-11-11 ENCOUNTER — Ambulatory Visit: Payer: Medicare Other | Admitting: Internal Medicine

## 2020-11-12 ENCOUNTER — Ambulatory Visit
Admission: RE | Admit: 2020-11-12 | Discharge: 2020-11-12 | Disposition: A | Discharge status: Home / Self Care | Payer: Medicare Other | Source: Ambulatory Visit | Attending: Internal Medicine | Admitting: Internal Medicine

## 2020-11-12 DIAGNOSIS — C7931 Secondary malignant neoplasm of brain: Secondary | ICD-10-CM

## 2020-11-12 MED ORDER — GADOBENATE DIMEGLUMINE 529 MG/ML IV SOLN
20.0000 mL | Freq: Once | INTRAVENOUS | Status: AC | PRN
  Administered 2020-11-12: 20 mL via INTRAVENOUS

## 2020-11-14 ENCOUNTER — Other Ambulatory Visit: Payer: Self-pay | Admitting: Radiation Therapy

## 2020-11-15 ENCOUNTER — Other Ambulatory Visit: Payer: Self-pay

## 2020-11-15 ENCOUNTER — Inpatient Hospital Stay: Payer: Medicare Other | Attending: Internal Medicine

## 2020-11-15 ENCOUNTER — Inpatient Hospital Stay (HOSPITAL_BASED_OUTPATIENT_CLINIC_OR_DEPARTMENT_OTHER): Payer: Medicare Other | Admitting: Internal Medicine

## 2020-11-15 VITALS — BP 110/91 | HR 91 | Temp 97.6°F | Resp 18 | Ht 73.0 in | Wt 240.6 lb

## 2020-11-15 DIAGNOSIS — R5383 Other fatigue: Secondary | ICD-10-CM

## 2020-11-15 DIAGNOSIS — R569 Unspecified convulsions: Secondary | ICD-10-CM

## 2020-11-15 DIAGNOSIS — Z79899 Other long term (current) drug therapy: Secondary | ICD-10-CM | POA: Diagnosis not present

## 2020-11-15 DIAGNOSIS — R42 Dizziness and giddiness: Secondary | ICD-10-CM | POA: Diagnosis not present

## 2020-11-15 DIAGNOSIS — C7A1 Malignant poorly differentiated neuroendocrine tumors: Secondary | ICD-10-CM | POA: Insufficient documentation

## 2020-11-15 DIAGNOSIS — C3432 Malignant neoplasm of lower lobe, left bronchus or lung: Secondary | ICD-10-CM | POA: Diagnosis not present

## 2020-11-15 DIAGNOSIS — R519 Headache, unspecified: Secondary | ICD-10-CM | POA: Insufficient documentation

## 2020-11-15 DIAGNOSIS — C7931 Secondary malignant neoplasm of brain: Secondary | ICD-10-CM | POA: Diagnosis present

## 2020-11-15 DIAGNOSIS — Z801 Family history of malignant neoplasm of trachea, bronchus and lung: Secondary | ICD-10-CM | POA: Diagnosis not present

## 2020-11-15 DIAGNOSIS — C3490 Malignant neoplasm of unspecified part of unspecified bronchus or lung: Secondary | ICD-10-CM

## 2020-11-15 LAB — CMP (CANCER CENTER ONLY)
ALT: 31 U/L (ref 0–44)
AST: 13 U/L — ABNORMAL LOW (ref 15–41)
Albumin: 3.6 g/dL (ref 3.5–5.0)
Alkaline Phosphatase: 92 U/L (ref 38–126)
Anion gap: 10 (ref 5–15)
BUN: 13 mg/dL (ref 6–20)
CO2: 23 mmol/L (ref 22–32)
Calcium: 9.4 mg/dL (ref 8.9–10.3)
Chloride: 103 mmol/L (ref 98–111)
Creatinine: 1.19 mg/dL (ref 0.61–1.24)
GFR, Estimated: >60 mL/min (ref >60)
Glucose, Bld: 182 mg/dL — ABNORMAL HIGH (ref 70–99)
Potassium: 3.9 mmol/L (ref 3.5–5.1)
Sodium: 136 mmol/L (ref 135–145)
Total Bilirubin: 0.4 mg/dL (ref 0.3–1.2)
Total Protein: 6.9 g/dL (ref 6.5–8.1)

## 2020-11-15 LAB — CBC WITH DIFFERENTIAL (CANCER CENTER ONLY)
Abs Immature Granulocytes: 0.03 10*3/uL (ref 0.00–0.07)
Basophils Absolute: 0 10*3/uL (ref 0.0–0.1)
Basophils Relative: 0 %
Eosinophils Absolute: 0.2 10*3/uL (ref 0.0–0.5)
Eosinophils Relative: 2 %
HCT: 49 % (ref 39.0–52.0)
Hemoglobin: 16.6 g/dL (ref 13.0–17.0)
Immature Granulocytes: 1 %
Lymphocytes Relative: 11 %
Lymphs Abs: 0.7 10*3/uL (ref 0.7–4.0)
MCH: 32.3 pg (ref 26.0–34.0)
MCHC: 33.9 g/dL (ref 30.0–36.0)
MCV: 95.3 fL (ref 80.0–100.0)
Monocytes Absolute: 0.6 10*3/uL (ref 0.1–1.0)
Monocytes Relative: 9 %
Neutro Abs: 5.1 10*3/uL (ref 1.7–7.7)
Neutrophils Relative %: 77 %
Platelet Count: 228 10*3/uL (ref 150–400)
RBC: 5.14 MIL/uL (ref 4.22–5.81)
RDW: 12.6 % (ref 11.5–15.5)
WBC Count: 6.6 10*3/uL (ref 4.0–10.5)
nRBC: 0 % (ref 0.0–0.2)

## 2020-11-15 LAB — TSH: TSH: 1.696 u[IU]/mL (ref 0.320–4.118)

## 2020-11-15 MED ORDER — DEXAMETHASONE 2 MG PO TABS: 4.0000 mg | ORAL_TABLET | Freq: Every day | ORAL | 1 refills | Status: DC

## 2020-11-15 NOTE — Progress Notes (Signed)
[facility] [place], [place] [address] [phone]   Interval Evaluation  Date of Service: 11/15/20 Patient Name: Calvin Wagner Patient MRN: [id number] Patient DOB: 1966/01/13 Provider: Ventura Sellers, MD  Identifying Statement:  Calvin Wagner is a 55 y.o. male with Brain metastasis ([place]) [C79.31]   Primary Cancer:  Oncologic History: Oncology History Overview Note  Patient presented with onset of cough then followed with scans.       Cancer ([place]) (Resolved)  11/25/2016 Initial Diagnosis   Cancer ([place])   11/25/2016 Imaging   CTA IMPRESSION: 1. Multiple bilateral acute pulmonary emboli, segmental to subsegmental size. 2. 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 There is complete obstruction of the lingular and left lower lobe airways. 1 cm pulmonary nodule in the left lateral costophrenic sulcus.   11/26/2016 Imaging   CT Abdomen No mass or adenopathy in the abdomen or pelvis   12/10/2016 Surgery   Operation: Video Flexible fiberoptic bronchoscopy, diagnostic    12/17/2016 Imaging   PET IMPRESSION: 1. Extensive left hilar and mediastinal all hypermetabolic lymphadenopathy, compatible with underlying malignancy. These findings could reflect either primary lymphoma or bronchogenic carcinoma such as a small cell carcinoma. 2. There is also a small left lower lobe pulmonary nodule measuring 11 mm which is mildly hypermetabolic. This could potentially represent the primary bronchogenic neoplasm, but it is un usual for a small nodule of this size to result in such extensive lymphadenopathy.   01/02/2017 Imaging   MRI Brain IMPRESSION:  Positive for a solitary 13 mm rim enhancing metastasis in the left anterior inferior frontal gyrus (series 10, image 72. No associated edema or mass effect.   01/04/2017 -  Radiation Therapy   SIM Chest    01/11/2017 -  Chemotherapy   The  patient had palonosetron (ALOXI) injection 0.25 mg, 0.25 mg, Intravenous,  Once, 1 of 7 cycles  CARBOplatin (PARAPLATIN) 280 mg in sodium chloride 0.9 % 250 mL chemo infusion, 280 mg (100 % of original dose 281.6 mg), Intravenous,  Once, 1 of 7 cycles Dose modification: 281.6 mg (original dose 281.6 mg, Cycle 1)  PACLitaxel (TAXOL) 102 mg in dextrose 5 % 250 mL chemo infusion ( for chemotherapy treatment.     Large cell carcinoma of left lung, stage 4 (HCC)  12/31/2016 Initial Diagnosis   Large cell carcinoma of left lung, stage 4 ([place])   10/27/2018 - 07/09/2020 Chemotherapy   The patient had nivolumab (OPDIVO) 480 mg in sodium chloride 0.9 % 100 mL chemo infusion, 480 mg, Intravenous, Once, 23 of 28 cycles Administration: 480 mg (10/27/2018), 480 mg (11/24/2018), 480 mg (12/22/2018), 480 mg (01/19/2019), 480 mg (02/16/2019), 480 mg (03/16/2019), 480 mg (04/10/2019), 480 mg (05/11/2019), 480 mg (06/08/2019), 480 mg (07/05/2019), 480 mg (08/02/2019), 480 mg (08/30/2019), 480 mg (09/29/2019), 480 mg (10/25/2019), 480 mg (11/22/2019), 480 mg (12/20/2019), 480 mg (01/25/2020), 480 mg (02/22/2020), 480 mg (03/21/2020), 480 mg (04/17/2020), 480 mg (05/15/2020), 480 mg (06/11/2020), 480 mg (07/09/2020)  for chemotherapy treatment.     CNS Oncologic History 01/25/17: SRS Treatment PTV1Left Frontal 17mm: 20 Gy in 1 fraction 10/14/18: Whole Brain / 30 Gy in 10 fractions 11/03/19: SRS L temporal 20Gy ([place]) 02/16/20: SRS to R parietal and cerebellar vermis targets 06/19/20: SRS to 2 additional metastases 09/03/20: Further progression; begins salvage therapy with 5-day TMZ cycle #1  Interval History:  Calvin Wagner presents to clinic for evaluation, now having completed 2  cycles of Temodar.  He again tolerated therapy well without complication.  Denies new or progressive neurologic deficits. Still complains of occassional dizziness upon standing, self limited, not worse from prior.  No issues with gait, motor funtion.  Denies  seizures.  Has had a couple of more prominent headaches this week.   Medications: Current Outpatient Medications on File Prior to Visit  Medication Sig Dispense Refill  . amLODipine (NORVASC) 5 MG tablet Take 5 mg by mouth daily.    . chlorpheniramine-HYDROcodone (TUSSIONEX) 10-8 MG/5ML SUER Take 5 mLs by mouth every 12 (twelve) hours as needed for cough. 240 mL 0  . dexamethasone (DECADRON) 1 MG tablet Take 1 tablet (1 mg total) by mouth daily.    . famotidine (PEPCID) 20 MG tablet Take 20 mg by mouth 2 (two) times daily.    . folic acid (FOLVITE) 1 MG tablet Take 1 tablet (1 mg total) by mouth daily. 30 tablet 4  . levETIRAcetam (KEPPRA) 750 MG tablet Take 750 mg by mouth 2 (two) times daily.    Marland Kitchen LORazepam (ATIVAN) 1 MG tablet TAKE ONE TAB BY MOUTH 30 MINUTES PRIOR TO MRI OR RADIOTHERAPY. (Patient not taking: Reported on 10/14/2020) 30 tablet 0  . losartan (COZAAR) 50 MG tablet Take 50 mg by mouth daily.    . magic mouthwash SOLN Take 5 mLs by mouth 4 (four) times daily as needed for mouth pain. 1:1:1 mix-Nystatin. Benadryl and extra strength maalox. 240 mL 0  . metoprolol succinate (TOPROL XL) 25 MG 24 hr tablet Take 1 tablet (25 mg total) by mouth daily. 90 tablet 1  . Multiple Vitamin (MULTIVITAMIN WITH MINERALS) TABS tablet Take 1 tablet by mouth daily.    . ondansetron (ZOFRAN) 8 MG tablet TAKE 1 TABLET BY MOUTH EVERY 8 HOURS AS NEEDED FOR NAUSEA OR VOMITING. 90 tablet 0  . ondansetron (ZOFRAN) 8 MG tablet Take 1 tablet (8 mg total) by mouth every 8 (eight) hours as needed for nausea or vomiting. Take 1 tablet 30-60 minutes prior to Temodar dose. 20 tablet 0  . PROAIR HFA 108 (90 Base) MCG/ACT inhaler Inhale 2 puffs into the lungs every 4 (four) hours as needed. 1 each 5  . prochlorperazine (COMPAZINE) 10 MG tablet TAKE 1 TABLET BY MOUTH EVERY 6 HOURS AS NEEDED FOR NAUSEA OR VOMITING 90 tablet 0  . sucralfate (CARAFATE) 1 g tablet Crush and mix in 1 oz water and drink 5 min TID before  meals and at HS for radiation induced esophagitis 120 tablet 2  . XARELTO 20 MG TABS tablet TAKE 1 TABLET(20 MG) BY MOUTH DAILY WITH SUPPER 30 tablet 2   No current facility-administered medications on file prior to visit.    Allergies: No Known Allergies Past Medical History:  Past Medical History:  Diagnosis Date  . Encounter for antineoplastic chemotherapy 12/31/2016  . GERD 11/08/2009   Qualifier: Diagnosis of  By: Ronnald Ramp MD, Arvid Right Goals of care, counseling/discussion 12/31/2016  . History of radiation therapy 01/13/2017 - 02/02/2017   Site/dose:  Left lung: 30 Gy in 15 fractions  . HYPERTENSION 11/08/2009   Qualifier: Diagnosis of  By: Ronnald Ramp MD, Arvid Right.   . Large cell carcinoma of left lung, stage 4 ([facility]) 12/31/2016  . Migraine 02/25/2012  . Pneumonia   . PONV (postoperative nausea and vomiting)   . Rotator cuff tear, left    Past Surgical History:  Past Surgical History:  Procedure Laterality Date  . INGUINAL HERNIA REPAIR    .  SHOULDER ARTHROSCOPY WITH ROTATOR CUFF REPAIR Left 11/12/2016   Procedure: SHOULDER ARTHROSCOPY WITH ROTATOR CUFF REPAIR AND SUBACROMIAL DECOMPRESSION;  Surgeon: Tania Ade, MD;  Location: [facility];  Service: Orthopedics;  Laterality: Left;  SHOULDER ARTHROSCOPY WITH ROTATOR CUFF REPAIR AND SUBACROMIAL DECOMPRESSION  . TONSILLECTOMY    . TOTAL KNEE ARTHROPLASTY    . VIDEO BRONCHOSCOPY Bilateral 12/10/2016   Procedure: VIDEO BRONCHOSCOPY WITHOUT FLUORO;  Surgeon: Tanda Rockers, MD;  Location: [facility];  Service: Cardiopulmonary;  Laterality: Bilateral;   Social History:  Social History   Socioeconomic History  . Marital status: Married    Spouse name: Baker Janus  . Number of children: 2  . Years of education: Not on file  . Highest education level: Not on file  Occupational History  . Occupation: Material Therapist, sports: [employer]  Tobacco Use  . Smoking status: Never Smoker  . Smokeless tobacco: Never Used  Vaping Use  .  Vaping Use: Never used  Substance and Sexual Activity  . Alcohol use: Yes    Comment: social  . Drug use: No  . Sexual activity: Not Currently  Other Topics Concern  . Not on file  Social History Narrative   Lives at home wife and 2 kids   Caffeine use: none     Social Determinants of Health   Financial Resource Strain: Not on file  Food Insecurity: Not on file  Transportation Needs: Not on file  Physical Activity: Not on file  Stress: Not on file  Social Connections: Not on file  Intimate Partner Violence: Not on file   Family History:  Family History  Problem Relation Age of Onset  . Heart disease Mother 72  . Lung cancer Other        family history  . CAD Other        strong family history male and male <50  . Mental retardation Other   . Heart attack Brother 24  . Alcohol abuse Neg Hx   . Diabetes Neg Hx   . Early death Neg Hx   . Hyperlipidemia Neg Hx   . Hypertension Neg Hx   . Kidney disease Neg Hx   . Stroke Neg Hx   . Migraines Neg Hx     Review of Systems: Constitutional: Denies fevers, chills or abnormal weight loss Eyes: Denies blurriness of vision Ears, nose, mouth, throat, and face: Denies mucositis or sore throat Respiratory: Denies cough, dyspnea or wheezes Cardiovascular: Denies palpitation, chest discomfort or lower extremity swelling Gastrointestinal:  Denies nausea, constipation, diarrhea GU: Denies dysuria or incontinence Skin: Denies abnormal skin rashes Neurological: Per HPI Musculoskeletal: Denies joint pain, back or neck discomfort. No decrease in ROM Behavioral/Psych: Denies anxiety, disturbance in thought content, and mood instability   Physical Exam: Vitals with BMI 10/14/2020 10/03/2020 08/27/2020  Height 6\' 1"  6\' 1"  6\' 1"   Weight 241 lbs 8 oz 240 lbs 243 lbs 8 oz  BMI 31.87 62.95 28.41  Systolic 324 401 027  Diastolic 75 90 77  Pulse 253 82 95   KPS: 90. General: Alert, cooperative, pleasant, in no acute distress Head:  Normal EENT: No conjunctival injection or scleral icterus. Oral mucosa moist Lungs: Resp effort normal Cardiac: Regular rate and rhythm Abdomen: Soft, non-distended abdomen Skin: Dry rash on face, ears.  Extremities: No clubbing or edema  Neurologic Exam: Mental Status: Awake, alert, attentive to examiner. Oriented to self and environment. Language is fluent with intact comprehension.  Cranial Nerves: Visual  acuity is grossly normal. Visual fields are full. Extra-ocular movements intact. No ptosis. Face is symmetric, tongue midline. Motor: Tone and bulk are normal. Power is full in both arms and legs. Reflexes are symmetric, no pathologic reflexes present. Intact finger to nose bilaterally Sensory: Intact to light touch and temperature Gait: Normal    Labs: I have reviewed the data as listed    Component Value Date/Time   NA 137 10/14/2020 1137   NA 138 10/08/2017 0907   K 4.6 10/14/2020 1137   K 4.1 10/08/2017 0907   CL 104 10/14/2020 1137   CO2 27 10/14/2020 1137   CO2 24 10/08/2017 0907   GLUCOSE 175 (H) 10/14/2020 1137   GLUCOSE 138 10/08/2017 0907   BUN 6 10/14/2020 1137   BUN 11.7 10/08/2017 0907   CREATININE 1.37 (H) 10/14/2020 1137   CREATININE 1.2 10/08/2017 0907   CALCIUM 9.4 10/14/2020 1137   CALCIUM 9.5 10/08/2017 0907   PROT 6.9 10/14/2020 1137   PROT 7.5 10/08/2017 0907   ALBUMIN 3.4 (L) 10/14/2020 1137   ALBUMIN 3.6 10/08/2017 0907   AST 18 10/14/2020 1137   AST 22 10/08/2017 0907   ALT 18 10/14/2020 1137   ALT 24 10/08/2017 0907   ALKPHOS 92 10/14/2020 1137   ALKPHOS 88 10/08/2017 0907   BILITOT 0.5 10/14/2020 1137   BILITOT 0.42 10/08/2017 0907   GFRNONAA >60 10/14/2020 1137   GFRAA >60 07/09/2020 0931   Lab Results  Component Value Date   WBC 6.6 11/15/2020   NEUTROABS 5.1 11/15/2020   HGB 16.6 11/15/2020   HCT 49.0 11/15/2020   MCV 95.3 11/15/2020   PLT 228 11/15/2020   Imaging:  [facility] Clinician Interpretation: I have personally reviewed  the CNS images as listed.  My interpretation, in the context of the patient's clinical presentation, is progressive disease  MR BRAIN W WO CONTRAST  Result Date: 11/12/2020 CLINICAL DATA:  Brain metastases. Brain/CNS neoplasm, assess treatment response. EXAM: MRI HEAD WITHOUT AND WITH CONTRAST TECHNIQUE: Multiplanar, multiecho pulse sequences of the brain and surrounding structures were obtained without and with intravenous contrast. CONTRAST:  1mL MULTIHANCE GADOBENATE DIMEGLUMINE 529 MG/ML IV SOLN COMPARISON:  MRI of the brain August 22, 2020. FINDINGS: Brain: No acute infarct, hemorrhage, hydrocephalus or extra-axial fluid collection. Multiple scattered enhancing lesions are seen throughout the brain parenchyma including super infratentorial compartments, labeled on series 12: Stable lesions: Left frontal operculum, 21 mm, image 90; left inferior frontal lobe, 3.4 cm, image 77; right frontal lobe, 2 mm, image 101; left frontal lobe, 1 mm, image 98. There is prominent vasogenic edema associated with the left inferior frontal and opercular lesion, unchanged from prior MRI. Enlarged lesions: Posterior right frontal lobe, 12 mm, image 146; right medial frontal lobe, 6 mm, image 138; left medial frontal lobe, 6 mm, image 136; right parietal lobe, 7 mm image 128; right parietal lobe, 5 mm, image 112; right frontal operculum, 6 mm, image 103; left caudate head, 6 mm, image 86; left posterior temporal, 7 mm, image 77; left insula, 4 mm, image 76; left temporal lobe, 10 mm, image 53; left occipital lobe, 10 mm, image 53; left cerebellar hemisphere, 5 mm, image 49; left cerebellar hemisphere, 5 mm, image 39; right cerebellar hemisphere, 4 mm, image 36. There is interval increase of the vasogenic edema associated with the left temporal occipital lesions and left cerebellar hemisphere lesion. New lesions: Approximately 47 new lesions are identified in the current study, labeled on series 12. Of note, technique in  the  sequence is similar to technique used for postcontrast images on prior study. Vascular: Normal flow voids. Skull and upper cervical spine: Normal marrow signal. Sinuses/Orbits: Mucosal thickening throughout the paranasal sinuses, more pronounced at the ethmoid cells. Other: Bilateral mastoid effusion. IMPRESSION: 1. Unequivocal evidence of disease progression with multiple new enhancing lesions identified in the current study, labeled on series 12. Increased vasogenic edema associated with the left temporal occipital lesions and left cerebellar hemisphere lesion. 2. Bilateral mastoid effusion. Electronically Signed   By: Pedro Earls M.D.   On: 11/12/2020 15:16   Assessment/Plan Brain Metastases  Calvin Wagner is clinically stable today, but unfortunately MRI demonstrates fulminant progression of disease.  >40 new small metastases are identified, and a number of previously treated lesions are also progressive radiographically.  He did complete 2 cycles of 5-day TMZ, but this has been ineffective at controlling CNS burden of tumor.  We will discuss his case in detail at brain/spine tumor board early next week.  Possible intervention would be hippocampal sparing WBRT with Namenda.  He tolerated whole brain very well previously, with little to no cognitive issues.  He also developed very little atrophy and leukomalacia.  He does understand the potential risks of repeating whole brain, including uncertain efficacy, likely cognitive effects, potential radio-inflammatory syndrome.  For high burden of dominant hemisphere edema, despite lack of symptoms, we think increasing decadron to 4mg  daily is appropriate.    Will continue Keppra 500mg  BID.  We appreciate the opportunity to participate in the care of Calvin Wagner.    We ask that Calvin Wagner return to clinic following next intervention, which will be discussed further in tumor board.  We will also give Dr. Elnoria Howard a call to discuss  increased urgency in addressing systemic disease and likely need for salvage chemotherapy.    All questions were answered. The patient knows to call the clinic with any problems, questions or concerns. No barriers to learning were detected.  The total time spent in the encounter was 30 minutes and more than 50% was on counseling and review of test results   Ventura Sellers, MD Medical Director of Neuro-Oncology [facility] at [place] 11/15/20 11:20 AM

## 2020-11-18 ENCOUNTER — Telehealth: Payer: Self-pay | Admitting: Radiation Oncology

## 2020-11-18 NOTE — Telephone Encounter (Signed)
I left a voicemail to let him know we had discussed his case in conference and echoed Dr. Renda Rolls recommendations for retreatment with whole brain radiation. Our simulation staff will call him back to coordinate simulation.

## 2020-11-19 ENCOUNTER — Encounter: Payer: Self-pay | Admitting: Radiation Oncology

## 2020-11-19 ENCOUNTER — Other Ambulatory Visit: Payer: Self-pay | Admitting: Radiation Oncology

## 2020-11-19 ENCOUNTER — Other Ambulatory Visit: Payer: Self-pay

## 2020-11-19 ENCOUNTER — Ambulatory Visit
Admission: RE | Admit: 2020-11-19 | Discharge: 2020-11-19 | Disposition: A | Discharge status: Home / Self Care | Payer: Medicare Other | Source: Ambulatory Visit | Attending: Radiation Oncology | Admitting: Radiation Oncology

## 2020-11-19 DIAGNOSIS — Z510 Encounter for antineoplastic radiation therapy: Secondary | ICD-10-CM | POA: Insufficient documentation

## 2020-11-19 DIAGNOSIS — C3432 Malignant neoplasm of lower lobe, left bronchus or lung: Secondary | ICD-10-CM | POA: Insufficient documentation

## 2020-11-19 DIAGNOSIS — C7931 Secondary malignant neoplasm of brain: Secondary | ICD-10-CM | POA: Diagnosis present

## 2020-11-19 DIAGNOSIS — C3490 Malignant neoplasm of unspecified part of unspecified bronchus or lung: Secondary | ICD-10-CM

## 2020-11-19 MED ORDER — MEMANTINE HCL 10 MG PO TABS: 10.0000 mg | ORAL_TABLET | Freq: Two times a day (BID) | ORAL | 4 refills | Status: DC

## 2020-11-19 MED ORDER — MEMANTINE HCL 5 MG PO TABS: ORAL_TABLET | ORAL | 0 refills | Status: DC

## 2020-11-19 NOTE — Progress Notes (Signed)
I saw the patient and his wife this morning in simulation to proceed with whole brain retreatment as he has >40 lesions. He's had previous whole brain and salvage SRS. He is planning to have re-consultation with Dr. Elnoria Howard and hope to have a virtual appointment with him soon. Mr. Kubisiak has not had restaging scans since October 2021. We will proceed with scans since he will be relocating medical oncology care to Dr. Elnoria Howard. I've asked our radiology team in the orders to powershare the images to [facility] for Dr. Guinevere Ferrari review.   Regarding his whole brain radiation, Dr. Lisbeth Renshaw is going to try for hippocampal sparing but make sure that all visible lesions are covered from his recent brain MRI. The patient is aware of the need to make sure that the quality of his treatment plan isn't sacrificed in trying to also spare his hippocamus. There is a lesion laterally that is close in proximity, which Dr. Lisbeth Renshaw intends to provide coverage for as well. We also reviewed discussion from brain oncology conference to also add Namenda and extrapolate the utility of this in cognitive preservation from small cell lung cancer studies using PCI. Mr. Skelley is in agreement. He does not have this prescription. I'll send it into his pharmacy. He's in agreement. We plan to start his whole brain radiation next week.     Carola Rhine, PAC

## 2020-11-20 ENCOUNTER — Other Ambulatory Visit: Payer: Self-pay | Admitting: Internal Medicine

## 2020-11-20 DIAGNOSIS — I2782 Chronic pulmonary embolism: Secondary | ICD-10-CM

## 2020-11-20 DIAGNOSIS — I2609 Other pulmonary embolism with acute cor pulmonale: Secondary | ICD-10-CM

## 2020-11-21 ENCOUNTER — Ambulatory Visit: Payer: Medicare Other | Admitting: Internal Medicine

## 2020-11-21 ENCOUNTER — Other Ambulatory Visit: Payer: Medicare Other

## 2020-11-26 ENCOUNTER — Ambulatory Visit (HOSPITAL_COMMUNITY)
Admission: RE | Admit: 2020-11-26 | Discharge: 2020-11-26 | Disposition: A | Discharge status: Home / Self Care | Payer: Medicare Other | Source: Ambulatory Visit | Attending: Radiation Oncology | Admitting: Radiation Oncology

## 2020-11-26 ENCOUNTER — Other Ambulatory Visit: Payer: Self-pay

## 2020-11-26 ENCOUNTER — Encounter (HOSPITAL_COMMUNITY): Payer: Self-pay

## 2020-11-26 ENCOUNTER — Ambulatory Visit
Admission: RE | Admit: 2020-11-26 | Discharge: 2020-11-26 | Disposition: A | Discharge status: Home / Self Care | Payer: Medicare Other | Source: Ambulatory Visit | Attending: Radiation Oncology | Admitting: Radiation Oncology

## 2020-11-26 DIAGNOSIS — Z510 Encounter for antineoplastic radiation therapy: Secondary | ICD-10-CM | POA: Diagnosis not present

## 2020-11-26 DIAGNOSIS — C3490 Malignant neoplasm of unspecified part of unspecified bronchus or lung: Secondary | ICD-10-CM | POA: Diagnosis present

## 2020-11-26 MED ORDER — IOHEXOL 300 MG/ML  SOLN
100.0000 mL | Freq: Once | INTRAMUSCULAR | Status: AC | PRN
  Administered 2020-11-26: 100 mL via INTRAVENOUS

## 2020-11-27 ENCOUNTER — Ambulatory Visit
Admission: RE | Admit: 2020-11-27 | Discharge: 2020-11-27 | Disposition: A | Discharge status: Home / Self Care | Payer: Medicare Other | Source: Ambulatory Visit | Attending: Radiation Oncology | Admitting: Radiation Oncology

## 2020-11-27 DIAGNOSIS — Z510 Encounter for antineoplastic radiation therapy: Secondary | ICD-10-CM | POA: Diagnosis not present

## 2020-11-28 ENCOUNTER — Ambulatory Visit
Admission: RE | Admit: 2020-11-28 | Discharge: 2020-11-28 | Disposition: A | Discharge status: Home / Self Care | Payer: Medicare Other | Source: Ambulatory Visit | Attending: Radiation Oncology | Admitting: Radiation Oncology

## 2020-11-28 DIAGNOSIS — Z510 Encounter for antineoplastic radiation therapy: Secondary | ICD-10-CM | POA: Diagnosis not present

## 2020-11-29 ENCOUNTER — Ambulatory Visit
Admission: RE | Admit: 2020-11-29 | Discharge: 2020-11-29 | Disposition: A | Discharge status: Home / Self Care | Payer: Medicare Other | Source: Ambulatory Visit | Attending: Radiation Oncology | Admitting: Radiation Oncology

## 2020-11-29 DIAGNOSIS — Z510 Encounter for antineoplastic radiation therapy: Secondary | ICD-10-CM | POA: Diagnosis not present

## 2020-12-02 ENCOUNTER — Ambulatory Visit
Admission: RE | Admit: 2020-12-02 | Discharge: 2020-12-02 | Disposition: A | Discharge status: Home / Self Care | Payer: Medicare Other | Source: Ambulatory Visit | Attending: Radiation Oncology | Admitting: Radiation Oncology

## 2020-12-02 ENCOUNTER — Other Ambulatory Visit: Payer: Self-pay

## 2020-12-02 DIAGNOSIS — Z510 Encounter for antineoplastic radiation therapy: Secondary | ICD-10-CM | POA: Diagnosis not present

## 2020-12-03 ENCOUNTER — Other Ambulatory Visit: Payer: Self-pay

## 2020-12-03 ENCOUNTER — Ambulatory Visit
Admission: RE | Admit: 2020-12-03 | Discharge: 2020-12-03 | Disposition: A | Discharge status: Home / Self Care | Payer: Medicare Other | Source: Ambulatory Visit | Attending: Radiation Oncology | Admitting: Radiation Oncology

## 2020-12-03 DIAGNOSIS — Z510 Encounter for antineoplastic radiation therapy: Secondary | ICD-10-CM | POA: Diagnosis not present

## 2020-12-04 ENCOUNTER — Other Ambulatory Visit: Payer: Self-pay

## 2020-12-04 ENCOUNTER — Ambulatory Visit
Admission: RE | Admit: 2020-12-04 | Discharge: 2020-12-04 | Disposition: A | Discharge status: Home / Self Care | Payer: Medicare Other | Source: Ambulatory Visit | Attending: Radiation Oncology | Admitting: Radiation Oncology

## 2020-12-04 DIAGNOSIS — Z510 Encounter for antineoplastic radiation therapy: Secondary | ICD-10-CM | POA: Diagnosis not present

## 2020-12-05 ENCOUNTER — Other Ambulatory Visit: Payer: Self-pay

## 2020-12-05 ENCOUNTER — Ambulatory Visit
Admission: RE | Admit: 2020-12-05 | Discharge: 2020-12-05 | Disposition: A | Discharge status: Home / Self Care | Payer: Medicare Other | Source: Ambulatory Visit | Attending: Radiation Oncology | Admitting: Radiation Oncology

## 2020-12-05 DIAGNOSIS — Z510 Encounter for antineoplastic radiation therapy: Secondary | ICD-10-CM | POA: Diagnosis not present

## 2020-12-06 ENCOUNTER — Other Ambulatory Visit: Payer: Self-pay

## 2020-12-06 ENCOUNTER — Ambulatory Visit
Admission: RE | Admit: 2020-12-06 | Discharge: 2020-12-06 | Disposition: A | Discharge status: Home / Self Care | Payer: Medicare Other | Source: Ambulatory Visit | Attending: Radiation Oncology | Admitting: Radiation Oncology

## 2020-12-06 DIAGNOSIS — Z510 Encounter for antineoplastic radiation therapy: Secondary | ICD-10-CM | POA: Diagnosis not present

## 2020-12-09 ENCOUNTER — Other Ambulatory Visit: Payer: Self-pay

## 2020-12-09 ENCOUNTER — Encounter: Payer: Self-pay | Admitting: Radiation Oncology

## 2020-12-09 ENCOUNTER — Ambulatory Visit
Admission: RE | Admit: 2020-12-09 | Discharge: 2020-12-09 | Disposition: A | Discharge status: Home / Self Care | Payer: Medicare Other | Source: Ambulatory Visit | Attending: Radiation Oncology | Admitting: Radiation Oncology

## 2020-12-09 DIAGNOSIS — Z510 Encounter for antineoplastic radiation therapy: Secondary | ICD-10-CM | POA: Diagnosis not present

## 2020-12-09 NOTE — Progress Notes (Signed)
  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  Intent: Palliative  Radiation Treatment Dates: 11/26/2020 through 12/09/2020 Site Technique Total Dose (Gy) Dose per Fx (Gy) Completed Fx Beam Energies  Brain: Brain IMRT 25/25 2.5 10/10 6X   Narrative: The patient tolerated radiation therapy relatively well. He started having dizziness and feeling off balance during radiotherapy. He simultaneously was tapering his steroids. His symptoms were relieved by sitting. His blood pressures remained stable. We've sent a message to Dr. Mickeal Skinner as well  Plan: The patient will receive a call in about one month from the radiation oncology department. He will continue follow up with Dr. Julien Nordmann and Dr. Elnoria Howard at [facility] as well as Dr. Mickeal Skinner. He will be due for another MRI through the brain oncology group in 3 months time.  ________________________________________________    Carola Rhine, PAC

## 2020-12-10 ENCOUNTER — Telehealth: Payer: Self-pay | Admitting: *Deleted

## 2020-12-10 NOTE — Telephone Encounter (Signed)
"  Calvin Wagner [phone]) calling to ask if you assist with "Continuing Disability Review Reports".  My husband, Calvin Wagner sees Dr. Mickeal Skinner, Dr. Lisbeth Renshaw, Dr. Julien Nordmann, everybody.   We just received form "[id number]" a few days ago that is due by December 18, 2020.  Are you able to assist with this information I do not know or recall?  What are these case type codes; Title II, Title XVI, DIB, DWB, CDB, and what do they mean?  They're asking for all information from the past 12 months but he's been coming there for four years."    Advised to contact [employer], applications) for representative assistance.  List all you are able to recall, providers above and others if applicable.  Baker Janus confirms access to patient portal.  Use (AVS) after visit summaries and MyChart (load more option) to see previous appointments and visit information.  [id number] reads do not ask health care provider to complete this form.   Providers are not complete form as indicated.  SSA will request last 12 months of medical records from all providers to match or support information you provide.

## 2020-12-16 ENCOUNTER — Other Ambulatory Visit: Payer: Self-pay | Admitting: Radiation Oncology

## 2020-12-17 ENCOUNTER — Other Ambulatory Visit: Payer: Self-pay | Admitting: Radiation Oncology

## 2020-12-18 ENCOUNTER — Other Ambulatory Visit: Payer: Self-pay | Admitting: Radiation Oncology

## 2020-12-18 MED ORDER — LORAZEPAM 1 MG PO TABS: ORAL_TABLET | ORAL | 0 refills | Status: DC

## 2020-12-19 ENCOUNTER — Inpatient Hospital Stay: Payer: Medicare Other | Attending: Internal Medicine | Admitting: Internal Medicine

## 2020-12-19 DIAGNOSIS — C7931 Secondary malignant neoplasm of brain: Secondary | ICD-10-CM

## 2020-12-19 NOTE — Progress Notes (Signed)
I connected with Calvin Wagner on 12/19/20 at 12:30 PM EST by telephone visit and verified that I am speaking with the correct person using two identifiers.  I discussed the limitations, risks, security and privacy concerns of performing an evaluation and management service by telemedicine and the availability of in-person appointments. I also discussed with the patient that there may be a patient responsible charge related to this service. The patient expressed understanding and agreed to proceed.  Other persons participating in the visit and their role in the encounter:  spouse  Patient's location:  Home  Provider's location:  Office  Chief Complaint:  Brain metastasis [facility]  History of Present Ilness: Calvin Wagner complains of dizziness episodes.  He describes "light headed feeling" in his head, typically when he stands up from seated/laying position and starts to walk.  No vertigo, no other neurologic symptoms.  Currently dosing 1mg  decadron twice per day, having completed whole brain irradiation on 12/09/20.  Planning to start carbo+etoposide with Dr. Elnoria Howard next week following adrenal biopsy Observations: Language and cognition at baseline Assessment and Plan: Brain metastasis ([place])  Suspect dizziness is non-focal, and secondary to impaired CNS perfusion from autonomic dysfunction, related to common neuropathic effects of taxol+platinum chemo.  Other factors include recent whole brain radiation, steroid taper.    Recommended no further tapering of steroids until next set of imaging.    He should transition from sitting to standing to walking more gradually, and lay down if he feels lightheadedness come on.   Follow Up Instructions: RTC in 2 months following MRI brain.  I discussed the assessment and treatment plan with the patient.  The patient was provided an opportunity to ask questions and all were answered.  The patient agreed with the plan and demonstrated understanding of the  instructions.    The patient was advised to call back or seek an in-person evaluation if the symptoms worsen or if the condition fails to improve as anticipated.  I provided 5-10 minutes of non-face-to-face time during this enocunter.  Ventura Sellers, MD   I provided 15 minutes of non face-to-face telephone visit time during this encounter, and > 50% was spent counseling as documented under my assessment & plan.

## 2020-12-24 NOTE — Progress Notes (Signed)
Disability paperwork received and submitted to Gillis Santa, RN

## 2020-12-25 ENCOUNTER — Encounter: Payer: Self-pay | Admitting: Internal Medicine

## 2020-12-25 DIAGNOSIS — K1379 Other lesions of oral mucosa: Secondary | ICD-10-CM

## 2020-12-26 MED ORDER — MAGIC MOUTHWASH: 5.0000 mL | Freq: Four times a day (QID) | ORAL | 0 refills | Status: AC | PRN

## 2020-12-27 ENCOUNTER — Telehealth: Payer: Self-pay

## 2020-12-27 NOTE — Telephone Encounter (Signed)
Pts wife left a message wanting to know if her FMLA forms were complete.  I review the FMLA tracker and saw the forms were turned in three days ago, 12/24/20. I have called the pts wife back and LM advising the forms are in process and to please allow 15 business days for completion.

## 2020-12-31 ENCOUNTER — Encounter: Payer: Self-pay | Admitting: Cardiovascular Disease

## 2021-01-02 ENCOUNTER — Other Ambulatory Visit: Payer: Self-pay | Admitting: Internal Medicine

## 2021-01-02 ENCOUNTER — Telehealth: Payer: Self-pay

## 2021-01-02 MED ORDER — SUMATRIPTAN SUCCINATE 50 MG PO TABS: 50.0000 mg | ORAL_TABLET | ORAL | 0 refills | Status: AC | PRN

## 2021-01-02 NOTE — Telephone Encounter (Signed)
-----   Message from Ventura Sellers, MD sent at 01/02/2021  1:03 PM EDT ----- I will write a script for Imitrex and send it through to his pharmacy  Ventura Sellers, MD ----- Message ----- From: Burna Mortimer, CMA Sent: 01/02/2021  11:02 AM EDT To: Ventura Sellers, MD  Dr. Mickeal Skinner,  Mrs. Ellena is calling to report that since the patient's recent change in chemo he has not been feeling well. She states that he is having "massive headaches". She was wondering if you could help prescribe a medication to help aid in the severity of his headaches. They have tried Tylenol but this does not seem to help the pain. Please advise.  Thanks, Myriam Jacobson

## 2021-01-02 NOTE — Telephone Encounter (Signed)
I left a message for Calvin Wagner (per DPR) to inform them that Dr. Mickeal Skinner had sent in a prescription for Imitrex to their requested pharmacy. In the voicemail I gave them the instructions on how take the medication and advised them to contact us with with questions or concerns.

## 2021-01-06 ENCOUNTER — Telehealth: Payer: Self-pay | Admitting: Internal Medicine

## 2021-01-06 ENCOUNTER — Telehealth: Payer: Self-pay | Admitting: *Deleted

## 2021-01-06 ENCOUNTER — Encounter (HOSPITAL_COMMUNITY): Payer: Self-pay

## 2021-01-06 ENCOUNTER — Encounter: Payer: Self-pay | Admitting: Internal Medicine

## 2021-01-06 ENCOUNTER — Emergency Department (HOSPITAL_COMMUNITY)
Admission: EM | Admit: 2021-01-06 | Discharge: 2021-01-06 | Disposition: A | Discharge status: Home / Self Care | Payer: Medicare Other | Attending: Emergency Medicine | Admitting: Emergency Medicine

## 2021-01-06 ENCOUNTER — Emergency Department (HOSPITAL_COMMUNITY): Payer: Medicare Other

## 2021-01-06 ENCOUNTER — Other Ambulatory Visit: Payer: Self-pay

## 2021-01-06 DIAGNOSIS — Z85118 Personal history of other malignant neoplasm of bronchus and lung: Secondary | ICD-10-CM | POA: Insufficient documentation

## 2021-01-06 DIAGNOSIS — R112 Nausea with vomiting, unspecified: Secondary | ICD-10-CM | POA: Diagnosis not present

## 2021-01-06 DIAGNOSIS — I10 Essential (primary) hypertension: Secondary | ICD-10-CM | POA: Diagnosis not present

## 2021-01-06 DIAGNOSIS — Z79899 Other long term (current) drug therapy: Secondary | ICD-10-CM | POA: Insufficient documentation

## 2021-01-06 DIAGNOSIS — I4891 Unspecified atrial fibrillation: Secondary | ICD-10-CM | POA: Diagnosis not present

## 2021-01-06 DIAGNOSIS — R519 Headache, unspecified: Secondary | ICD-10-CM

## 2021-01-06 DIAGNOSIS — Z7901 Long term (current) use of anticoagulants: Secondary | ICD-10-CM | POA: Diagnosis not present

## 2021-01-06 DIAGNOSIS — J45909 Unspecified asthma, uncomplicated: Secondary | ICD-10-CM | POA: Diagnosis not present

## 2021-01-06 LAB — COMPREHENSIVE METABOLIC PANEL
ALT: 27 U/L (ref 0–44)
AST: 23 U/L (ref 15–41)
Albumin: 3.7 g/dL (ref 3.5–5.0)
Alkaline Phosphatase: 92 U/L (ref 38–126)
Anion gap: 14 (ref 5–15)
BUN: 13 mg/dL (ref 6–20)
CO2: 24 mmol/L (ref 22–32)
Calcium: 9.2 mg/dL (ref 8.9–10.3)
Chloride: 100 mmol/L (ref 98–111)
Creatinine, Ser: 0.9 mg/dL (ref 0.61–1.24)
GFR, Estimated: >60 mL/min (ref >60)
Glucose, Bld: 168 mg/dL — ABNORMAL HIGH (ref 70–99)
Potassium: 4.1 mmol/L (ref 3.5–5.1)
Sodium: 138 mmol/L (ref 135–145)
Total Bilirubin: 0.8 mg/dL (ref 0.3–1.2)
Total Protein: 7 g/dL (ref 6.5–8.1)

## 2021-01-06 LAB — CBC WITH DIFFERENTIAL/PLATELET
Abs Immature Granulocytes: 0.6 10*3/uL — ABNORMAL HIGH (ref 0.00–0.07)
Band Neutrophils: 10 %
Basophils Absolute: 0 10*3/uL (ref 0.0–0.1)
Basophils Relative: 0 %
Eosinophils Absolute: 0 10*3/uL (ref 0.0–0.5)
Eosinophils Relative: 0 %
HCT: 33.6 % — ABNORMAL LOW (ref 39.0–52.0)
Hemoglobin: 11.9 g/dL — ABNORMAL LOW (ref 13.0–17.0)
Lymphocytes Relative: 3 %
Lymphs Abs: 0.3 10*3/uL — ABNORMAL LOW (ref 0.7–4.0)
MCH: 33.1 pg (ref 26.0–34.0)
MCHC: 35.4 g/dL (ref 30.0–36.0)
MCV: 93.6 fL (ref 80.0–100.0)
Metamyelocytes Relative: 2 %
Monocytes Absolute: 0.5 10*3/uL (ref 0.1–1.0)
Monocytes Relative: 5 %
Myelocytes: 4 %
Neutro Abs: 8.5 10*3/uL — ABNORMAL HIGH (ref 1.7–7.7)
Neutrophils Relative %: 76 %
Platelets: 46 10*3/uL — ABNORMAL LOW (ref 150–400)
RBC: 3.59 MIL/uL — ABNORMAL LOW (ref 4.22–5.81)
RDW: 13.8 % (ref 11.5–15.5)
WBC: 9.9 10*3/uL (ref 4.0–10.5)
nRBC: 0.2 % (ref 0.0–0.2)

## 2021-01-06 MED ORDER — GADOBUTROL 1 MMOL/ML IV SOLN
10.0000 mL | Freq: Once | INTRAVENOUS | Status: AC | PRN
  Administered 2021-01-06: 10 mL via INTRAVENOUS

## 2021-01-06 MED ORDER — HYDROMORPHONE HCL 1 MG/ML IJ SOLN
1.0000 mg | Freq: Once | INTRAMUSCULAR | Status: AC
Start: 2021-01-06 — End: 2021-01-06
  Administered 2021-01-06: 1 mg via INTRAVENOUS
  Filled 2021-01-06: qty 1

## 2021-01-06 MED ORDER — DEXAMETHASONE 4 MG PO TABS: 4.0000 mg | ORAL_TABLET | Freq: Two times a day (BID) | ORAL | 0 refills | Status: DC

## 2021-01-06 MED ORDER — SODIUM CHLORIDE 0.9 % IV BOLUS
1000.0000 mL | Freq: Once | INTRAVENOUS | Status: AC
  Administered 2021-01-06: 1000 mL via INTRAVENOUS

## 2021-01-06 MED ORDER — DEXAMETHASONE SODIUM PHOSPHATE 10 MG/ML IJ SOLN
10.0000 mg | Freq: Once | INTRAMUSCULAR | Status: AC
  Administered 2021-01-06: 10 mg via INTRAVENOUS
  Filled 2021-01-06: qty 1

## 2021-01-06 MED ORDER — ONDANSETRON HCL 4 MG/2ML IJ SOLN
4.0000 mg | Freq: Once | INTRAMUSCULAR | Status: AC
  Administered 2021-01-06: 4 mg via INTRAVENOUS
  Filled 2021-01-06: qty 2

## 2021-01-06 MED ORDER — SODIUM CHLORIDE 0.9 % IV SOLN: INTRAVENOUS | Status: DC

## 2021-01-06 MED ORDER — LORAZEPAM 2 MG/ML IJ SOLN
1.0000 mg | Freq: Once | INTRAMUSCULAR | Status: AC
  Administered 2021-01-06: 1 mg via INTRAVENOUS
  Filled 2021-01-06: qty 1

## 2021-01-06 NOTE — ED Triage Notes (Addendum)
Patient complaining of migraine, took Imitrex at home at 0900, now with nausea and vomiting.  Patient has hx of brain tumor and lung cancer

## 2021-01-06 NOTE — ED Notes (Signed)
Patient transported to MRI 

## 2021-01-06 NOTE — Telephone Encounter (Signed)
Patients spouse sent mychart message concerned about progressive headaches.    Called Wife Baker Janus.  She advised that week before last patient got chemotherapy at [facility].  He started having headaches and went to [facility] and got IV fluids and a migraine cocktail last Thursday 01/02/2021.  Wife states that patient did not tolerate the migraine cocktail and had worsening confusion/lethary.  Patient was also found to be neutropenic and was given neulasta at home pump.  ANC was 0.1 at [facility].  She states that headaches has progressed and last night he was up and down with a total of maybe 1 hour of sleep.  States she gave him Imitrex at 2:30, 4:30, 6:30 and 9:00 without much help.  He normally takes 0.25mg  of Decadron and she has given him additional doses this  Morning of Decadron with a total of 0.75 mg Decadron.    She states she called EMS because around 5:00 patient was talking very confused and unable to follow commands.  Per Dr. Mickeal Skinner (Neuro Oncologist) he recommended that patient go to ER and be evaluated.  His next MRI is not due until May but he is concerned of changes that would contribute to this worsening symptom of uncontrolled headaches.  Spoke with wife and she advised that she would bring patient to ER this morning.    Dr. Mickeal Skinner made aware.

## 2021-01-06 NOTE — ED Notes (Signed)
Patient back from MRI.

## 2021-01-06 NOTE — Telephone Encounter (Signed)
Scheduled appts per 3/28 sch msg. Called pt, no answer. Left msg with appts date and time.

## 2021-01-06 NOTE — ED Notes (Signed)
Patient verbalized understanding of dc instructions, follow up referrals and reasons to return to ER for reevaluation.

## 2021-01-06 NOTE — ED Notes (Signed)
Assisted patient to wheelchair with his wife, patient taken out to ER entrance via wheelchair.

## 2021-01-06 NOTE — Telephone Encounter (Signed)
Magic mouth wash was called into pharmacy as well.

## 2021-01-06 NOTE — ED Provider Notes (Signed)
[facility] DEPT Provider Note   CSN: [id number] Arrival date & time: 01/06/21  1024     History Chief Complaint  Patient presents with  . Migraine    Calvin Wagner is a 55 y.o. male.  55 year old male with history of brain tumor presents with worsening headache with associated nausea and vomiting.  Denies any new focal weakness or visual changes.  Patient states that the headache is on the right side of his head and persistent.  No fever or neck pain.  Symptoms unresponsive to his home meds.  Called his oncologist and told to come here        Past Medical History:  Diagnosis Date  . Encounter for antineoplastic chemotherapy 12/31/2016  . GERD 11/08/2009   Qualifier: Diagnosis of  By: Ronnald Ramp MD, Arvid Right Goals of care, counseling/discussion 12/31/2016  . History of radiation therapy 01/13/2017 - 02/02/2017   Site/dose:  Left lung: 30 Gy in 15 fractions  . HYPERTENSION 11/08/2009   Qualifier: Diagnosis of  By: Ronnald Ramp MD, Arvid Right.   . 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 Migraine 02/25/2012  . Pneumonia   . PONV (postoperative nausea and vomiting)   . Rotator cuff tear, left     Patient Active Problem List   Diagnosis Date Noted  . Atrial fibrillation ([place]) 08/02/2020  . Aortic root dilation ([place]) 08/02/2020  . Focal seizure ([place]) 01/05/2020  . Umbilical hernia 42/35/3614  . Tinnitus 12/04/2019  . Hyperlipidemia 12/04/2019  . Diastasis recti 12/04/2019  . Dysphagia 12/04/2019  . Eosinophilic esophagitis 43/15/4008  . Dry skin 11/22/2019  . Encounter for antineoplastic immunotherapy 10/21/2018  . Malignant neoplasm of bronchus of left lower lobe ([place]) 06/08/2018  . Brain metastasis ([place]) 02/17/2017  . Hypersensitivity reaction 01/12/2017  . Long term current use of anticoagulant therapy 01/12/2017  . 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 Goals of care, counseling/discussion  12/31/2016  . Encounter for antineoplastic chemotherapy 12/31/2016  . Obstructive pneumonia 11/25/2016  . Pulmonary embolus ([place]) 11/25/2016  . Bronchial obstruction   . Solitary pulmonary nodule 11/24/2016  . Dyspnea 11/24/2016  . Cough 10/20/2016  . Pulmonary infiltrates on CXR 10/20/2016  . Asthmatic bronchitis , chronic ([place]) 10/20/2016  . Headache 10/10/2015  . Worsening headaches 10/10/2015  . Vision changes 10/10/2015  . Snoring 10/10/2015  . Uncontrolled morning headache 10/10/2015  . 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 Dizziness 10/10/2015  . Nausea with vomiting 10/10/2015  . Chest pain 07/21/2014  . Alcohol dependence ([place]) 07/21/2014  . Abdominal pain 07/21/2014  . Chest pain, atypical 07/21/2014  . Chest pain at rest 07/21/2014  . Other abnormal glucose 10/26/2012  . Migraine 02/25/2012  . Hypertension 11/08/2009  . GERD 11/08/2009    Past Surgical History:  Procedure Laterality Date  . INGUINAL HERNIA REPAIR    . SHOULDER ARTHROSCOPY WITH ROTATOR CUFF REPAIR Left 11/12/2016   Procedure: SHOULDER ARTHROSCOPY WITH ROTATOR CUFF REPAIR AND SUBACROMIAL DECOMPRESSION;  Surgeon: Tania Ade, MD;  Location: [facility];  Service: Orthopedics;  Laterality: Left;  SHOULDER ARTHROSCOPY WITH ROTATOR CUFF REPAIR AND SUBACROMIAL DECOMPRESSION  . TONSILLECTOMY    . TOTAL KNEE ARTHROPLASTY    . VIDEO BRONCHOSCOPY Bilateral 12/10/2016   Procedure: VIDEO BRONCHOSCOPY WITHOUT FLUORO;  Surgeon: Tanda Rockers, MD;  Location: [facility];  Service: Cardiopulmonary;  Laterality: Bilateral;       Family History  Problem Relation Age  of Onset  . Heart disease Mother 65  . Lung cancer Other        family history  . CAD Other        strong family history male and male <50  . Mental retardation Other   . Heart attack Brother 52  . Alcohol abuse Neg Hx   . Diabetes Neg Hx   . Early death Neg Hx   . Hyperlipidemia Neg Hx   . Hypertension Neg Hx   . Kidney disease Neg Hx    . Stroke Neg Hx   . Migraines Neg Hx     Social History   Tobacco Use  . Smoking status: Never Smoker  . Smokeless tobacco: Never Used  Vaping Use  . Vaping Use: Never used  Substance Use Topics  . Alcohol use: Yes    Comment: social  . Drug use: No    Home Medications Prior to Admission medications   Medication Sig Start Date End Date Taking? Authorizing Provider  amLODipine (NORVASC) 5 MG tablet Take 5 mg by mouth daily.    [provider]  betamethasone dipropionate (DIPROLENE) 0.05 % ointment APPLY TO AFFECTED AREA 1-2 TIMES A DAY AS NEEDED 11/05/20   [provider]  chlorpheniramine-HYDROcodone (TUSSIONEX) 10-8 MG/5ML SUER Take 5 mLs by mouth every 12 (twelve) hours as needed for cough. Patient not taking: Reported on 11/15/2020 08/02/19   Heilingoetter, Cassandra L, PA-C  dexamethasone (DECADRON) 2 MG tablet Take 2 tablets (4 mg total) by mouth daily. 11/15/20   Ventura Sellers, MD  famotidine (PEPCID) 20 MG tablet Take 20 mg by mouth 2 (two) times daily.    [provider]  folic acid (FOLVITE) 1 MG tablet Take 1 tablet (1 mg total) by mouth daily. 07/06/17   Curt Bears, MD  levETIRAcetam (KEPPRA) 750 MG tablet Take 750 mg by mouth 2 (two) times daily.    [provider]  LORazepam (ATIVAN) 1 MG tablet Take one tab po q hs prn sleep or prn nausea/anxiety 12/18/20   Hayden Pedro, PA-C  losartan (COZAAR) 50 MG tablet Take 50 mg by mouth daily.    [provider]  magic mouthwash SOLN Take 5 mLs by mouth 4 (four) times daily as needed for mouth pain. 1:1:1 mix-Nystatin. Benadryl and extra strength maalox. 12/26/20   Ventura Sellers, MD  memantine (NAMENDA) 10 MG tablet Take 1 tablet (10 mg total) by mouth 2 (two) times daily. 11/19/20   Hayden Pedro, PA-C  memantine [facility]) 5 MG tablet PLEASE SEE ATTACHED FOR DETAILED DIRECTIONS 12/16/20   Hayden Pedro, PA-C  metoprolol succinate (TOPROL XL) 25 MG 24 hr  tablet Take 1 tablet (25 mg total) by mouth daily. 11/06/20   O'NealCassie Freer, MD  Multiple Vitamin (MULTIVITAMIN WITH MINERALS) TABS tablet Take 1 tablet by mouth daily.    [provider]  ondansetron (ZOFRAN) 8 MG tablet TAKE 1 TABLET BY MOUTH EVERY 8 HOURS AS NEEDED FOR NAUSEA OR VOMITING. 11/21/18   Hayden Pedro, PA-C  ondansetron (ZOFRAN) 8 MG tablet Take 1 tablet (8 mg total) by mouth every 8 (eight) hours as needed for nausea or vomiting. Take 1 tablet 30-60 minutes prior to Temodar dose. Patient not taking: Reported on 11/15/2020 09/16/20   Ventura Sellers, MD  PROAIR HFA 108 [phone] Base) MCG/ACT inhaler Inhale 2 puffs into the lungs every 4 (four) hours as needed. 06/27/20   Hayden Pedro, PA-C  prochlorperazine (COMPAZINE)  10 MG tablet TAKE 1 TABLET BY MOUTH EVERY 6 HOURS AS NEEDED FOR NAUSEA OR VOMITING Patient not taking: Reported on 11/15/2020 02/09/19   Hayden Pedro, PA-C  sucralfate (CARAFATE) 1 g tablet Crush and mix in 1 oz water and drink 5 min TID before meals and at HS for radiation induced esophagitis 06/27/20   Hayden Pedro, PA-C  SUMAtriptan (IMITREX) 50 MG tablet Take 1 tablet (50 mg total) by mouth every 2 (two) hours as needed for migraine. May repeat in 2 hours if headache persists or recurs. 01/02/21   Vaslow, Acey Lav, MD  XARELTO 20 MG TABS tablet TAKE 1 TABLET(20 MG) BY MOUTH DAILY WITH SUPPER 08/18/20   Curt Bears, MD    Allergies    Patient has no known allergies.  Review of Systems   Review of Systems  All other systems reviewed and are negative.   Physical Exam Updated Vital Signs BP 122/70 (BP Location: Right Arm)   Pulse 66   Temp 98.4 F (36.9 C) (Oral)   Resp 16   Ht 1.88 m (6\' 2" )   Wt 109.8 kg   SpO2 99%   BMI 31.07 kg/m   Physical Exam Vitals and nursing note reviewed.  Constitutional:      General: He is not in acute distress.    Appearance: Normal appearance. He is well-developed. He is  not toxic-appearing.  HENT:     Head: Normocephalic and atraumatic.  Eyes:     General: Lids are normal.     Conjunctiva/sclera: Conjunctivae normal.     Pupils: Pupils are equal, round, and reactive to light.  Neck:     Thyroid: No thyroid mass.     Trachea: No tracheal deviation.  Cardiovascular:     Rate and Rhythm: Normal rate and regular rhythm.     Heart sounds: Normal heart sounds. No murmur heard. No gallop.   Pulmonary:     Effort: Pulmonary effort is normal. No respiratory distress.     Breath sounds: Normal breath sounds. No stridor. No decreased breath sounds, wheezing, rhonchi or rales.  Abdominal:     General: Bowel sounds are normal. There is no distension.     Palpations: Abdomen is soft.     Tenderness: There is no abdominal tenderness. There is no rebound.  Musculoskeletal:        General: No tenderness. Normal range of motion.     Cervical back: Normal range of motion and neck supple.  Skin:    General: Skin is warm and dry.     Findings: No abrasion or rash.  Neurological:     General: No focal deficit present.     Mental Status: He is alert and oriented to person, place, and time.     GCS: GCS eye subscore is 4. GCS verbal subscore is 5. GCS motor subscore is 6.     Cranial Nerves: No cranial nerve deficit.     Sensory: No sensory deficit.  Psychiatric:        Attention and Perception: Attention normal.        Speech: Speech normal.        Behavior: Behavior normal.     ED Results / Procedures / Treatments   Labs (all labs ordered are listed, but only abnormal results are displayed) Labs Reviewed - No data to display  EKG None  Radiology No results found.  Procedures Procedures   Medications Ordered in ED Medications  sodium chloride 0.9 % bolus  1,000 mL (has no administration in time range)  0.9 %  sodium chloride infusion (has no administration in time range)  HYDROmorphone (DILAUDID) injection 1 mg (has no administration in time range)   ondansetron (ZOFRAN) injection 4 mg (has no administration in time range)    ED Course  I have reviewed the triage vital signs and the nursing notes.  Pertinent labs & imaging results that were available during my care of the patient were reviewed by me and considered in my medical decision making (see chart for details).    MDM Rules/Calculators/A&P                          Patient medicated for pain here and does feel better.  MRI of brain with findings noted.  Discussed with Dr. Mickeal Skinner who recommends patient receive Decadron 10 mg IV push here and be placed on 4 mg twice daily and he will see in follow-up in the office Final Clinical Impression(s) / ED Diagnoses Final diagnoses:  None    Rx / DC Orders ED Discharge Orders    None       Lacretia Leigh, MD 01/06/21 1340

## 2021-01-06 NOTE — Discharge Instructions (Addendum)
Follow-up with your doctor as we discussed

## 2021-01-07 ENCOUNTER — Other Ambulatory Visit: Payer: Self-pay | Admitting: Internal Medicine

## 2021-01-07 MED ORDER — METOCLOPRAMIDE HCL 10 MG PO TABS: 10.0000 mg | ORAL_TABLET | Freq: Three times a day (TID) | ORAL | 1 refills | Status: AC

## 2021-01-07 MED ORDER — DIVALPROEX SODIUM 500 MG PO DR TAB: 500.0000 mg | DELAYED_RELEASE_TABLET | Freq: Two times a day (BID) | ORAL | 1 refills | Status: DC

## 2021-01-07 MED ORDER — DEXAMETHASONE 4 MG PO TABS: 4.0000 mg | ORAL_TABLET | Freq: Two times a day (BID) | ORAL | 1 refills | Status: AC

## 2021-01-07 MED ORDER — TRAMADOL HCL 50 MG PO TABS: 50.0000 mg | ORAL_TABLET | Freq: Four times a day (QID) | ORAL | 1 refills | Status: DC | PRN

## 2021-01-08 ENCOUNTER — Other Ambulatory Visit: Payer: Self-pay | Admitting: Radiation Therapy

## 2021-01-09 ENCOUNTER — Inpatient Hospital Stay (HOSPITAL_BASED_OUTPATIENT_CLINIC_OR_DEPARTMENT_OTHER): Payer: Medicare Other | Admitting: Internal Medicine

## 2021-01-09 DIAGNOSIS — G8929 Other chronic pain: Secondary | ICD-10-CM

## 2021-01-09 DIAGNOSIS — C7931 Secondary malignant neoplasm of brain: Secondary | ICD-10-CM

## 2021-01-09 DIAGNOSIS — R519 Headache, unspecified: Secondary | ICD-10-CM | POA: Diagnosis not present

## 2021-01-09 NOTE — Progress Notes (Signed)
I connected with Haskel Khan on 01/09/21 at 11:00 AM EDT by telephone visit and verified that I am speaking with the correct person using two identifiers.  I discussed the limitations, risks, security and privacy concerns of performing an evaluation and management service by telemedicine and the availability of in-person appointments. I also discussed with the patient that there may be a patient responsible charge related to this service. The patient expressed understanding and agreed to proceed.  Other persons participating in the visit and their role in the encounter:  spouse  Patient's location:  Home  Provider's location:  Office  Chief Complaint:  Brain metastasis ([place])  Chronic intractable headache, unspecified headache type  History of Present Ilness: KEANAN MELANDER describes considerable improvement in headache symptoms over the past two days since starting new treatment regimen.  He has been headache free all morning, has needed some aleve (yesterday) but no Tramadol.  No side effects with the Depakote and Reglan, he does not some "wobbliness" and gait instability, though he remains independent.  Has only been dosing decadron at 2mg  twice per day rather than 4mg  twice per day as instructed. Observations: Language and cognition at baseline, communicating clearly Assessment and Plan: Brain metastasis (H[facility])  Chronic intractable headache, unspecified headache type  We are encouraged that Laurier reports improved symptoms today.  He feels well enough that he would like to defer goals of care conversation, he is interested in resuming chemotherapy with Dr. Elnoria Howard if possible.    We recommended continuing standing depakote 500mg  BID, Reglan 10mg  BID, decadron 2mg  BID, and withholding breakthrough (Aleve or Tramadol) for severe or refractory pain.  Will communicate plan with [facility] medical oncology team  Follow Up Instructions:  RTC in 3-4 weeks  I discussed the assessment and treatment plan with  the patient.  The patient was provided an opportunity to ask questions and all were answered.  The patient agreed with the plan and demonstrated understanding of the instructions.    The patient was advised to call back or seek an in-person evaluation if the symptoms worsen or if the condition fails to improve as anticipated.    Ventura Sellers, MD   I provided 21 minutes of non face-to-face telephone visit time during this encounter, and > 50% was spent counseling as documented under my assessment & plan.

## 2021-01-10 ENCOUNTER — Emergency Department (HOSPITAL_BASED_OUTPATIENT_CLINIC_OR_DEPARTMENT_OTHER)
Admission: EM | Admit: 2021-01-10 | Discharge: 2021-01-11 | Disposition: A | Discharge status: Home / Self Care | Payer: Medicare Other | Attending: Emergency Medicine | Admitting: Emergency Medicine

## 2021-01-10 ENCOUNTER — Emergency Department (HOSPITAL_BASED_OUTPATIENT_CLINIC_OR_DEPARTMENT_OTHER): Payer: Medicare Other

## 2021-01-10 ENCOUNTER — Other Ambulatory Visit: Payer: Self-pay

## 2021-01-10 DIAGNOSIS — Z79899 Other long term (current) drug therapy: Secondary | ICD-10-CM | POA: Insufficient documentation

## 2021-01-10 DIAGNOSIS — R569 Unspecified convulsions: Secondary | ICD-10-CM | POA: Diagnosis present

## 2021-01-10 DIAGNOSIS — Z85118 Personal history of other malignant neoplasm of bronchus and lung: Secondary | ICD-10-CM | POA: Diagnosis not present

## 2021-01-10 DIAGNOSIS — I10 Essential (primary) hypertension: Secondary | ICD-10-CM | POA: Insufficient documentation

## 2021-01-10 DIAGNOSIS — J45909 Unspecified asthma, uncomplicated: Secondary | ICD-10-CM | POA: Diagnosis not present

## 2021-01-10 DIAGNOSIS — Z7901 Long term (current) use of anticoagulants: Secondary | ICD-10-CM | POA: Diagnosis not present

## 2021-01-10 DIAGNOSIS — I4891 Unspecified atrial fibrillation: Secondary | ICD-10-CM | POA: Insufficient documentation

## 2021-01-10 LAB — BASIC METABOLIC PANEL
Anion gap: 11 (ref 5–15)
BUN: 19 mg/dL (ref 6–20)
CO2: 28 mmol/L (ref 22–32)
Calcium: 8.3 mg/dL — ABNORMAL LOW (ref 8.9–10.3)
Chloride: 95 mmol/L — ABNORMAL LOW (ref 98–111)
Creatinine, Ser: 1.02 mg/dL (ref 0.61–1.24)
GFR, Estimated: >60 mL/min (ref >60)
Glucose, Bld: 152 mg/dL — ABNORMAL HIGH (ref 70–99)
Potassium: 3.6 mmol/L (ref 3.5–5.1)
Sodium: 134 mmol/L — ABNORMAL LOW (ref 135–145)

## 2021-01-10 MED ORDER — LEVETIRACETAM IN NACL 1500 MG/100ML IV SOLN: 1500.0000 mg | Freq: Once | INTRAVENOUS | Status: DC

## 2021-01-10 MED ORDER — DEXAMETHASONE SODIUM PHOSPHATE 10 MG/ML IJ SOLN
8.0000 mg | Freq: Once | INTRAMUSCULAR | Status: AC
  Administered 2021-01-10: 8 mg via INTRAVENOUS
  Filled 2021-01-10: qty 1

## 2021-01-10 MED ORDER — LEVETIRACETAM IN NACL 500 MG/100ML IV SOLN
500.0000 mg | Freq: Once | INTRAVENOUS | Status: AC
  Administered 2021-01-10: 500 mg via INTRAVENOUS
  Filled 2021-01-10: qty 100

## 2021-01-10 MED ORDER — LEVETIRACETAM IN NACL 1000 MG/100ML IV SOLN
1000.0000 mg | Freq: Once | INTRAVENOUS | Status: AC
  Administered 2021-01-10: 1000 mg via INTRAVENOUS
  Filled 2021-01-10: qty 100

## 2021-01-10 MED ORDER — SODIUM CHLORIDE 0.9 % IV BOLUS
500.0000 mL | Freq: Once | INTRAVENOUS | Status: AC
  Administered 2021-01-10: 500 mL via INTRAVENOUS

## 2021-01-10 NOTE — ED Provider Notes (Signed)
[facility] EMERGENCY DEPT Provider Note   CSN: [id number] Arrival date & time: 01/10/21  2249     History Chief Complaint  Patient presents with  . Seizures    Calvin Wagner is a 55 y.o. male.  Patient presents to the emergency department for evaluation of multiple seizures.  Patient has a history of lung cancer with metastasis to brain.  He has had seizures when he was first diagnosed, but has been well controlled for the last year on Keppra.  He is undergoing chemotherapy and has had whole brain radiation therapy.  Patient has been dealing with severe headaches and this week had his Keppra stopped and was placed on Depakote to help with migraines.  Wife reports multiple episodes of becoming rigid, staring and not responding.  No generalized tonic-clonic movements.  Patient has had Reglan added to his regimen to help with migraines.  He also was given Ultram for "breakthrough pain".  He did take an Ultram earlier today, prior to the seizures, which he has not done before.        Past Medical History:  Diagnosis Date  . Encounter for antineoplastic chemotherapy 12/31/2016  . GERD 11/08/2009   Qualifier: Diagnosis of  By: Ronnald Ramp MD, Arvid Right Goals of care, counseling/discussion 12/31/2016  . History of radiation therapy 01/13/2017 - 02/02/2017   Site/dose:  Left lung: 30 Gy in 15 fractions  . HYPERTENSION 11/08/2009   Qualifier: Diagnosis of  By: Ronnald Ramp MD, Arvid Right.   . 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 Migraine 02/25/2012  . Pneumonia   . PONV (postoperative nausea and vomiting)   . Rotator cuff tear, left     Patient Active Problem List   Diagnosis Date Noted  . Atrial fibrillation ([place]) 08/02/2020  . Aortic root dilation ([place]) 08/02/2020  . Focal seizure ([place]) 01/05/2020  . Umbilical hernia 25/36/6440  . Tinnitus 12/04/2019  . Hyperlipidemia 12/04/2019  . Diastasis recti 12/04/2019  . Dysphagia 12/04/2019  . Eosinophilic  esophagitis 34/74/2595  . Dry skin 11/22/2019  . Encounter for antineoplastic immunotherapy 10/21/2018  . Malignant neoplasm of bronchus of left lower lobe ([place]) 06/08/2018  . Brain metastasis ([place]) 02/17/2017  . Hypersensitivity reaction 01/12/2017  . Long term current use of anticoagulant therapy 01/12/2017  . 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 Goals of care, counseling/discussion 12/31/2016  . Encounter for antineoplastic chemotherapy 12/31/2016  . Obstructive pneumonia 11/25/2016  . Pulmonary embolus ([place]) 11/25/2016  . Bronchial obstruction   . Solitary pulmonary nodule 11/24/2016  . Dyspnea 11/24/2016  . Cough 10/20/2016  . Pulmonary infiltrates on CXR 10/20/2016  . Asthmatic bronchitis , chronic ([place]) 10/20/2016  . Headache 10/10/2015  . Worsening headaches 10/10/2015  . Vision changes 10/10/2015  . Snoring 10/10/2015  . Uncontrolled morning headache 10/10/2015  . 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 Dizziness 10/10/2015  . Nausea with vomiting 10/10/2015  . Chest pain 07/21/2014  . Alcohol dependence ([place]) 07/21/2014  . Abdominal pain 07/21/2014  . Chest pain, atypical 07/21/2014  . Chest pain at rest 07/21/2014  . Other abnormal glucose 10/26/2012  . Migraine 02/25/2012  . Hypertension 11/08/2009  . GERD 11/08/2009    Past Surgical History:  Procedure Laterality Date  . INGUINAL HERNIA REPAIR    . SHOULDER ARTHROSCOPY WITH ROTATOR CUFF REPAIR Left 11/12/2016   Procedure: SHOULDER ARTHROSCOPY WITH ROTATOR CUFF REPAIR AND SUBACROMIAL DECOMPRESSION;  Surgeon: Tania Ade, MD;  Location: [facility];  Service: Orthopedics;  Laterality: Left;  SHOULDER ARTHROSCOPY WITH ROTATOR CUFF REPAIR AND SUBACROMIAL DECOMPRESSION  . TONSILLECTOMY    . TOTAL KNEE ARTHROPLASTY    . VIDEO BRONCHOSCOPY Bilateral 12/10/2016   Procedure: VIDEO BRONCHOSCOPY WITHOUT FLUORO;  Surgeon: Tanda Rockers, MD;  Location: [facility];  Service: Cardiopulmonary;   Laterality: Bilateral;       Family History  Problem Relation Age of Onset  . Heart disease Mother 28  . Lung cancer Other        family history  . CAD Other        strong family history male and male <50  . Mental retardation Other   . Heart attack Brother 61  . Alcohol abuse Neg Hx   . Diabetes Neg Hx   . Early death Neg Hx   . Hyperlipidemia Neg Hx   . Hypertension Neg Hx   . Kidney disease Neg Hx   . Stroke Neg Hx   . Migraines Neg Hx     Social History   Tobacco Use  . Smoking status: Never Smoker  . Smokeless tobacco: Never Used  Vaping Use  . Vaping Use: Never used  Substance Use Topics  . Alcohol use: Yes    Comment: social  . Drug use: No    Home Medications Prior to Admission medications   Medication Sig Start Date End Date Taking? Authorizing Provider  divalproex (DEPAKOTE) 250 MG DR tablet Take 3 tablets (750 mg total) by mouth 2 (two) times daily. 01/11/21 02/10/21 Yes Lei Dower, Gwenyth Allegra, MD  oxyCODONE-acetaminophen (PERCOCET) 5-325 MG tablet Take 1-2 tablets by mouth every 4 (four) hours as needed. 01/11/21  Yes Timothea Bodenheimer, Gwenyth Allegra, MD  amLODipine (NORVASC) 5 MG tablet Take 5 mg by mouth daily.    [provider]  betamethasone dipropionate (DIPROLENE) 0.05 % ointment APPLY TO AFFECTED AREA 1-2 TIMES A DAY AS NEEDED 11/05/20   [provider]  chlorpheniramine-HYDROcodone (TUSSIONEX) 10-8 MG/5ML SUER Take 5 mLs by mouth every 12 (twelve) hours as needed for cough. Patient not taking: Reported on 11/15/2020 08/02/19   Heilingoetter, Cassandra L, PA-C  dexamethasone (DECADRON) 4 MG tablet Take 1 tablet (4 mg total) by mouth 2 (two) times daily. 01/07/21   Ventura Sellers, MD  famotidine (PEPCID) 20 MG tablet Take 20 mg by mouth 2 (two) times daily.    [provider]  folic acid (FOLVITE) 1 MG tablet Take 1 tablet (1 mg total) by mouth daily. 07/06/17   Curt Bears, MD  LORazepam (ATIVAN) 1 MG tablet Take one tab po q hs prn  sleep or prn nausea/anxiety 12/18/20   Hayden Pedro, PA-C  losartan (COZAAR) 50 MG tablet Take 50 mg by mouth daily.    [provider]  magic mouthwash SOLN Take 5 mLs by mouth 4 (four) times daily as needed for mouth pain. 1:1:1 mix-Nystatin. Benadryl and extra strength maalox. 12/26/20   Vaslow, Acey Lav, MD  metoCLOPramide (REGLAN) 10 MG tablet Take 1 tablet (10 mg total) by mouth 3 (three) times daily before meals. 01/07/21   Ventura Sellers, MD  metoprolol succinate (TOPROL XL) 25 MG 24 hr tablet Take 1 tablet (25 mg total) by mouth daily. 11/06/20   O'NealCassie Freer, MD  Multiple Vitamin (MULTIVITAMIN WITH MINERALS) TABS tablet Take 1 tablet by mouth daily.    [provider]  ondansetron (ZOFRAN) 8 MG tablet TAKE 1 TABLET BY MOUTH EVERY 8 HOURS AS NEEDED  FOR NAUSEA OR VOMITING. 11/21/18   Hayden Pedro, PA-C  ondansetron (ZOFRAN) 8 MG tablet Take 1 tablet (8 mg total) by mouth every 8 (eight) hours as needed for nausea or vomiting. Take 1 tablet 30-60 minutes prior to Temodar dose. Patient not taking: Reported on 11/15/2020 09/16/20   Ventura Sellers, MD  PROAIR HFA 108 [phone] Base) MCG/ACT inhaler Inhale 2 puffs into the lungs every 4 (four) hours as needed. 06/27/20   Hayden Pedro, PA-C  prochlorperazine (COMPAZINE) 10 MG tablet TAKE 1 TABLET BY MOUTH EVERY 6 HOURS AS NEEDED FOR NAUSEA OR VOMITING Patient not taking: Reported on 11/15/2020 02/09/19   Hayden Pedro, PA-C  sucralfate (CARAFATE) 1 g tablet Crush and mix in 1 oz water and drink 5 min TID before meals and at HS for radiation induced esophagitis 06/27/20   Hayden Pedro, PA-C  SUMAtriptan (IMITREX) 50 MG tablet Take 1 tablet (50 mg total) by mouth every 2 (two) hours as needed for migraine. May repeat in 2 hours if headache persists or recurs. 01/02/21   Vaslow, Acey Lav, MD  XARELTO 20 MG TABS tablet TAKE 1 TABLET(20 MG) BY MOUTH DAILY WITH SUPPER 08/18/20   Curt Bears, MD    Allergies    Patient has no known allergies.  Review of Systems   Review of Systems  Neurological: Positive for seizures and headaches.  All other systems reviewed and are negative.   Physical Exam Updated Vital Signs BP 111/81   Pulse 78   Temp 97.7 F (36.5 C) (Oral)   Resp 13   Ht 6' (1.829 m)   Wt 117.9 kg   SpO2 97%   BMI 35.26 kg/m   Physical Exam Vitals and nursing note reviewed.  Constitutional:      General: He is not in acute distress.    Appearance: Normal appearance. He is well-developed.  HENT:     Head: Normocephalic and atraumatic.     Right Ear: Hearing normal.     Left Ear: Hearing normal.     Nose: Nose normal.  Eyes:     Conjunctiva/sclera: Conjunctivae normal.     Pupils: Pupils are equal, round, and reactive to light.  Cardiovascular:     Rate and Rhythm: Regular rhythm.     Heart sounds: S1 normal and S2 normal. No murmur heard. No friction rub. No gallop.   Pulmonary:     Effort: Pulmonary effort is normal. No respiratory distress.     Breath sounds: Normal breath sounds.  Chest:     Chest wall: No tenderness.  Abdominal:     General: Bowel sounds are normal.     Palpations: Abdomen is soft.     Tenderness: There is no abdominal tenderness. There is no guarding or rebound. Negative signs include Murphy's sign and McBurney's sign.     Hernia: No hernia is present.  Musculoskeletal:        General: Normal range of motion.     Cervical back: Normal range of motion and neck supple.  Skin:    General: Skin is warm and dry.     Findings: No rash.  Neurological:     Mental Status: He is alert and oriented to person, place, and time.     GCS: GCS eye subscore is 4. GCS verbal subscore is 5. GCS motor subscore is 6.     Cranial Nerves: No cranial nerve deficit.     Sensory: No sensory deficit.  Coordination: Coordination normal.  Psychiatric:        Speech: Speech normal.        Behavior: Behavior normal.         Thought Content: Thought content normal.     ED Results / Procedures / Treatments   Labs (all labs ordered are listed, but only abnormal results are displayed) Labs Reviewed  CBC WITH DIFFERENTIAL/PLATELET - Abnormal; Notable for the following components:      Result Value   WBC 18.5 (*)    RBC 3.08 (*)    Hemoglobin 10.1 (*)    HCT 28.6 (*)    Platelets 135 (*)    nRBC 0.6 (*)    Neutro Abs 13.0 (*)    Lymphs Abs 0.5 (*)    Monocytes Absolute 1.2 (*)    Basophils Absolute 0.2 (*)    Abs Immature Granulocytes 3.64 (*)    All other components within normal limits  BASIC METABOLIC PANEL - Abnormal; Notable for the following components:   Sodium 134 (*)    Chloride 95 (*)    Glucose, Bld 152 (*)    Calcium 8.3 (*)    All other components within normal limits  VALPROIC ACID LEVEL    EKG None  Radiology CT HEAD WO CONTRAST  Result Date: 01/11/2021 CLINICAL DATA:  Headache EXAM: CT HEAD WITHOUT CONTRAST TECHNIQUE: Contiguous axial images were obtained from the base of the skull through the vertex without intravenous contrast. COMPARISON:  01/02/2020, MRI 01/06/2021 FINDINGS: Brain: The previously seen numerous metastases throughout the brain are difficult to visualized on this noncontrast head CT, but the resulting edema throughout the cerebral hemispheres is again noted, left greater than right. Mass effect on the left lateral ventricle anterior horn. No hemorrhage or hydrocephalus. No midline shift. Vascular: No hyperdense vessel or unexpected calcification. Skull: No acute calvarial abnormality. Sinuses/Orbits: No acute finding Other: None IMPRESSION: Extensive metastatic disease difficult to visualize on this noncontrast head CT. Extensive edema throughout the cerebral hemispheres bilaterally, left greater than right, similar to recent MRI. Stable mass effect on the left lateral ventricle. No hemorrhage or hydrocephalus. Electronically Signed   By: Rolm Baptise M.D.   On: 01/11/2021  00:29    Procedures Procedures   Medications Ordered in ED Medications  sodium chloride 0.9 % bolus 500 mL (0 mLs Intravenous Stopped 01/10/21 2355)  levETIRAcetam (KEPPRA) IVPB 1000 mg/100 mL premix (0 mg Intravenous Stopped 01/11/21 0002)    Followed by  levETIRAcetam (KEPPRA) IVPB 500 mg/100 mL premix (0 mg Intravenous Stopped 01/11/21 0014)  dexamethasone (DECADRON) injection 8 mg (8 mg Intravenous Given 01/10/21 2342)  LORazepam (ATIVAN) injection 1 mg (1 mg Intravenous Given 01/11/21 0154)    ED Course  I have reviewed the triage vital signs and the nursing notes.  Pertinent labs & imaging results that were available during my care of the patient were reviewed by me and considered in my medical decision making (see chart for details).    MDM Rules/Calculators/A&P                          Patient has had multiple seizures today.  This is likely multifactorial.  Patient has known brain metastases.  He did have an MRI performed earlier this week which shows multiple fairly large lesions with extensive vasogenic edema.  Wife has been significantly decreasing his dose of dexamethasone because she has identified it as a cause of agitation and sleeplessness.  Patient  has been well controlled on his Keppra which was stopped this week and changed to Depakote.  He also took Ultram today.  Patient has had, in addition to the seizures, multiple episodes of involuntary movements today.  Wife describes clicking of his fingers, rolling around in bed restlessly as well as getting up and walking around.  Some of this could be subacute seizure, but I also suspect some of this is akathisia from his newly prescribed Reglan to prevent migraines.  Patient had a head CT performed because of the headaches with known brain mets and chronic anticoagulation.  No evidence of bleeding.  Case discussed with Dr. Leonel Ramsay, on-call for neurology.  He agrees with stopping Ultram which certainly can lower his seizure  threshold.  He feels that putting him back on Keppra, which had previously controlled his seizures, would be reasonable.  He does, however, tell me that Keppra can cause headaches and therefore also feels that it would be reasonable to increase the dose of Depakote to 750 mg twice daily to both help prevent seizures as well as help with his headaches would be valid as well.  I discussed this with the patient and his wife.  At this point it seems that Dr. Mickeal Skinner has been working hard to try and limit his headaches and therefore I feel it would be reasonable to continue with the Depakote at a higher dose.  I might back off on the Reglan, use more in a prn basis, as he does seem to be getting some akathisia.  I did recommend trying to get closer to the prescribed dose of Decadron, as the significant amount of vasogenic edema is likely causing his increased migraines.  Finally, I will replace the Ultram with Percocet.  Final Clinical Impression(s) / ED Diagnoses Final diagnoses:  Seizure [facility])    Rx / DC Orders ED Discharge Orders         Ordered    divalproex (DEPAKOTE) 250 MG DR tablet  2 times daily        01/11/21 0157    oxyCODONE-acetaminophen (PERCOCET) 5-325 MG tablet  Every 4 hours PRN        01/11/21 0157           Orpah Greek, MD 01/11/21 [phone]

## 2021-01-10 NOTE — ED Triage Notes (Addendum)
Pt BIB EMS - from home -  following a seizure today  -  Per EMS ,  Pt had 2 episode today   -  GCS 15 at this  Time   Pt  Has hx of lung cancer and mets to brain - states his seizure meds was changed this week and this his first episode since -

## 2021-01-11 DIAGNOSIS — R569 Unspecified convulsions: Secondary | ICD-10-CM | POA: Diagnosis not present

## 2021-01-11 LAB — CBC WITH DIFFERENTIAL/PLATELET
Abs Immature Granulocytes: 3.64 10*3/uL — ABNORMAL HIGH (ref 0.00–0.07)
Basophils Absolute: 0.2 10*3/uL — ABNORMAL HIGH (ref 0.0–0.1)
Basophils Relative: 1 %
Eosinophils Absolute: 0 10*3/uL (ref 0.0–0.5)
Eosinophils Relative: 0 %
HCT: 28.6 % — ABNORMAL LOW (ref 39.0–52.0)
Hemoglobin: 10.1 g/dL — ABNORMAL LOW (ref 13.0–17.0)
Immature Granulocytes: 20 %
Lymphocytes Relative: 3 %
Lymphs Abs: 0.5 10*3/uL — ABNORMAL LOW (ref 0.7–4.0)
MCH: 32.8 pg (ref 26.0–34.0)
MCHC: 35.3 g/dL (ref 30.0–36.0)
MCV: 92.9 fL (ref 80.0–100.0)
Monocytes Absolute: 1.2 10*3/uL — ABNORMAL HIGH (ref 0.1–1.0)
Monocytes Relative: 7 %
Neutro Abs: 13 10*3/uL — ABNORMAL HIGH (ref 1.7–7.7)
Neutrophils Relative %: 69 %
Platelets: 135 10*3/uL — ABNORMAL LOW (ref 150–400)
RBC: 3.08 MIL/uL — ABNORMAL LOW (ref 4.22–5.81)
RDW: 14 % (ref 11.5–15.5)
WBC: 18.5 10*3/uL — ABNORMAL HIGH (ref 4.0–10.5)
nRBC: 0.6 % — ABNORMAL HIGH (ref 0.0–0.2)

## 2021-01-11 LAB — VALPROIC ACID LEVEL: Valproic Acid Lvl: 35 ug/mL — ABNORMAL LOW (ref 50.0–100.0)

## 2021-01-11 MED ORDER — LORAZEPAM 2 MG/ML IJ SOLN
1.0000 mg | Freq: Once | INTRAMUSCULAR | Status: AC
  Administered 2021-01-11: 1 mg via INTRAVENOUS
  Filled 2021-01-11: qty 1

## 2021-01-11 MED ORDER — OXYCODONE-ACETAMINOPHEN 5-325 MG PO TABS: 1.0000 | ORAL_TABLET | ORAL | 0 refills | Status: AC | PRN

## 2021-01-11 MED ORDER — DIVALPROEX SODIUM 250 MG PO DR TAB: 750.0000 mg | DELAYED_RELEASE_TABLET | Freq: Two times a day (BID) | ORAL | 0 refills | Status: DC

## 2021-01-11 NOTE — ED Notes (Signed)
Called [facility] for transport home.  

## 2021-01-11 NOTE — Discharge Instructions (Addendum)
Increase Depakote to 750mg  twice a day. Stop Ultram altogether.

## 2021-01-13 ENCOUNTER — Telehealth: Payer: Self-pay | Admitting: *Deleted

## 2021-01-13 NOTE — Telephone Encounter (Signed)
Received call from patient's wife. Calvin Wagner. She wanted Dr. Mickeal Skinner to know that pt had gone to te ED @ [facility] Friday for headache and seizures. She states that some medication changes were made and she would appreciate a call back on 01/14/21 to go over those changes with him.  Pt slept most of the weekend but ia awake today and eating a little bit,

## 2021-01-14 ENCOUNTER — Telehealth: Payer: Self-pay | Admitting: Internal Medicine

## 2021-01-14 NOTE — Telephone Encounter (Signed)
Scheduled per los. Called and left msg. Mailed printout  °

## 2021-01-15 ENCOUNTER — Encounter: Payer: Self-pay | Admitting: Internal Medicine

## 2021-01-21 ENCOUNTER — Telehealth: Payer: Self-pay | Admitting: *Deleted

## 2021-01-21 NOTE — Telephone Encounter (Signed)
01/20/2021 Dr Mickeal Skinner placed referral to Hospice for patient.

## 2021-01-23 ENCOUNTER — Telehealth: Payer: Self-pay | Admitting: *Deleted

## 2021-01-23 MED ORDER — DIVALPROEX SODIUM 250 MG PO DR TAB: 750.0000 mg | DELAYED_RELEASE_TABLET | Freq: Two times a day (BID) | ORAL | 0 refills | Status: DC

## 2021-01-23 MED ORDER — LORAZEPAM 1 MG PO TABS: ORAL_TABLET | ORAL | 0 refills | Status: DC

## 2021-01-23 NOTE — Telephone Encounter (Signed)
Received call from [facility] with [facility] [phone]. Patient needs refill of Ativan 1mg  and Depakote to be refilled to [facility].  Routed to Dr Mickeal Skinner.

## 2021-01-23 NOTE — Addendum Note (Signed)
Addended by: Ventura Sellers on: 01/23/2021 04:50 PM   Modules accepted: Orders

## 2021-01-27 ENCOUNTER — Telehealth: Payer: Self-pay | Admitting: *Deleted

## 2021-01-27 ENCOUNTER — Other Ambulatory Visit: Payer: Self-pay | Admitting: Internal Medicine

## 2021-01-27 MED ORDER — LORAZEPAM 1 MG PO TABS: ORAL_TABLET | ORAL | 0 refills | Status: DC

## 2021-01-27 NOTE — Telephone Encounter (Signed)
Calvin Wagner from [facility] [phone] called requesting increase in frequency availability of Ativan for seizure management.  Also stated they are holding his losartan because his blood pressure has been dropping.  His urine concentration has been getting worse and he is sleeping more.  She also states that she will send over signature to sign oxygen for comfort since patient was saturating 85-88%.    Dr. Mickeal Skinner agreeable to all of the above and new Rx for Ativan was ordered.

## 2021-01-29 ENCOUNTER — Other Ambulatory Visit: Payer: Self-pay | Admitting: Internal Medicine

## 2021-02-03 ENCOUNTER — Other Ambulatory Visit: Payer: Self-pay | Admitting: *Deleted

## 2021-02-03 DIAGNOSIS — I2609 Other pulmonary embolism with acute cor pulmonale: Secondary | ICD-10-CM

## 2021-02-03 MED ORDER — RIVAROXABAN 20 MG PO TABS: 20.0000 mg | ORAL_TABLET | Freq: Every day | ORAL | 0 refills | Status: AC

## 2021-02-04 ENCOUNTER — Telehealth: Payer: Self-pay | Admitting: *Deleted

## 2021-02-04 NOTE — Telephone Encounter (Signed)
[facility] called to clarify dosing of Depakote.  Patient's wife has been dosing 1000mg  BID which is not what the last Rx stated.  Per Dr Mickeal Skinner she is ok to continue to dose as she has been as it will help with headaches and agitation.  Hospice nurse notified.

## 2021-02-06 ENCOUNTER — Other Ambulatory Visit: Payer: Self-pay | Admitting: Internal Medicine

## 2021-02-06 ENCOUNTER — Ambulatory Visit: Payer: Medicare Other | Admitting: Internal Medicine

## 2021-02-06 ENCOUNTER — Telehealth: Payer: Self-pay | Admitting: *Deleted

## 2021-02-06 MED ORDER — DIVALPROEX SODIUM 125 MG PO CSDR: 1000.0000 mg | DELAYED_RELEASE_CAPSULE | Freq: Two times a day (BID) | ORAL | 5 refills | Status: AC

## 2021-02-06 NOTE — Telephone Encounter (Signed)
Maura @ [facility] [phone]  Called requesting depakote in the sprinkle form.  He will fill that prescription.

## 2021-02-13 ENCOUNTER — Other Ambulatory Visit: Payer: Medicare Other

## 2021-02-17 ENCOUNTER — Ambulatory Visit
Admission: RE | Admit: 2021-02-17 | Discharge: 2021-02-17 | Disposition: A | Discharge status: Home / Self Care | Payer: Medicare Other | Source: Ambulatory Visit | Attending: Radiation Oncology | Admitting: Radiation Oncology

## 2021-02-17 DIAGNOSIS — C7931 Secondary malignant neoplasm of brain: Secondary | ICD-10-CM

## 2021-02-17 NOTE — Progress Notes (Signed)
Pt under hospice care. Will follow expectantly.

## 2021-02-20 ENCOUNTER — Ambulatory Visit: Payer: Medicare Other | Admitting: Internal Medicine

## 2021-02-23 ENCOUNTER — Other Ambulatory Visit: Payer: Self-pay | Admitting: Internal Medicine

## 2021-02-24 NOTE — Telephone Encounter (Signed)
Rx refill request

## 2021-03-07 ENCOUNTER — Other Ambulatory Visit: Payer: Self-pay | Admitting: Internal Medicine

## 2021-03-11 ENCOUNTER — Telehealth: Payer: Self-pay | Admitting: *Deleted

## 2021-03-11 MED ORDER — DIVALPROEX SODIUM 500 MG PO DR TAB: 1000.0000 mg | DELAYED_RELEASE_TABLET | Freq: Two times a day (BID) | ORAL | 3 refills | Status: AC

## 2021-03-11 NOTE — Telephone Encounter (Signed)
Wife called and needs refill of Depakote.  Need new Rx for Depakote 500mg  (takes 2 tablets) twice daily for a total of 1000mg  BID.  Wants to save Sprinkles Rx for when he has swallowing issues and is trying to use the least amount of tablets.  [facility] process to [facility] [address]. Routed to MD

## 2021-03-11 NOTE — Addendum Note (Signed)
Addended by: Ventura Sellers on: 03/11/2021 03:20 PM   Modules accepted: Orders

## 2021-03-21 ENCOUNTER — Ambulatory Visit: Payer: Medicare Other | Admitting: Cardiovascular Disease

## 2021-03-25 ENCOUNTER — Other Ambulatory Visit: Payer: Self-pay | Admitting: Internal Medicine

## 2021-03-25 MED ORDER — LORAZEPAM 1 MG PO TABS: ORAL_TABLET | ORAL | 1 refills | Status: AC

## 2021-04-11 DEATH — deceased

## 2021-04-22 ENCOUNTER — Other Ambulatory Visit: Payer: Self-pay | Admitting: Internal Medicine

## 2021-04-22 ENCOUNTER — Telehealth: Payer: Self-pay | Admitting: *Deleted

## 2021-04-22 MED ORDER — SCOPOLAMINE 1 MG/3DAYS TD PT72: 1.0000 | MEDICATED_PATCH | TRANSDERMAL | 1 refills | Status: AC

## 2021-04-22 NOTE — Telephone Encounter (Signed)
Received call from [facility] with [facility] [phone].  She called to advise that patient is starting to transition more and is having heavy secretions and they re ordering a suction machine for the family.    She wanted to see If Dr Mickeal Skinner would be agreeable to ordering a scopolamine patch for the patient and if so could [facility] [address].  Routed to Dr Mickeal Skinner to review and advise.

## 2021-04-24 ENCOUNTER — Telehealth: Payer: Self-pay | Admitting: Medical Oncology

## 2021-04-24 ENCOUNTER — Telehealth: Payer: Self-pay | Admitting: *Deleted

## 2021-04-24 NOTE — Telephone Encounter (Signed)
Received notification from Hospice that patient died at home 05/18/21 @ 3:28 pm

## 2021-05-05 NOTE — Telephone Encounter (Signed)
Called [facility] and expressed my condolences.

## 2021-05-12 DEATH — deceased
